# Patient Record
Sex: Female | Born: 1955 | Race: White | Hispanic: No | Marital: Married | Smoking: Never smoker
Health system: Southern US, Community
[De-identification: ages and names within clinical notes are randomized; demographics above are authoritative.]

## PROBLEM LIST (undated history)

## (undated) DIAGNOSIS — Z9889 Other specified postprocedural states: Secondary | ICD-10-CM

## (undated) DIAGNOSIS — D649 Anemia, unspecified: Secondary | ICD-10-CM

## (undated) DIAGNOSIS — I4891 Unspecified atrial fibrillation: Secondary | ICD-10-CM

## (undated) DIAGNOSIS — J189 Pneumonia, unspecified organism: Secondary | ICD-10-CM

## (undated) DIAGNOSIS — T4145XA Adverse effect of unspecified anesthetic, initial encounter: Secondary | ICD-10-CM

## (undated) DIAGNOSIS — T7840XA Allergy, unspecified, initial encounter: Secondary | ICD-10-CM

## (undated) DIAGNOSIS — R112 Nausea with vomiting, unspecified: Secondary | ICD-10-CM

## (undated) DIAGNOSIS — K220 Achalasia of cardia: Secondary | ICD-10-CM

## (undated) DIAGNOSIS — T8859XA Other complications of anesthesia, initial encounter: Secondary | ICD-10-CM

## (undated) DIAGNOSIS — I499 Cardiac arrhythmia, unspecified: Secondary | ICD-10-CM

## (undated) DIAGNOSIS — E041 Nontoxic single thyroid nodule: Secondary | ICD-10-CM

## (undated) DIAGNOSIS — K219 Gastro-esophageal reflux disease without esophagitis: Secondary | ICD-10-CM

## (undated) DIAGNOSIS — I10 Essential (primary) hypertension: Secondary | ICD-10-CM

## (undated) DIAGNOSIS — K5090 Crohn's disease, unspecified, without complications: Secondary | ICD-10-CM

## (undated) DIAGNOSIS — M1990 Unspecified osteoarthritis, unspecified site: Secondary | ICD-10-CM

## (undated) DIAGNOSIS — J40 Bronchitis, not specified as acute or chronic: Secondary | ICD-10-CM

## (undated) HISTORY — PX: ANKLE SURGERY: SHX546

## (undated) HISTORY — PX: THROAT SURGERY: SHX803

## (undated) HISTORY — DX: Unspecified osteoarthritis, unspecified site: M19.90

## (undated) HISTORY — DX: Gastro-esophageal reflux disease without esophagitis: K21.9

## (undated) HISTORY — PX: OTHER SURGICAL HISTORY: SHX169

## (undated) HISTORY — PX: ABDOMINAL HYSTERECTOMY: SHX81

## (undated) HISTORY — DX: Bronchitis, not specified as acute or chronic: J40

## (undated) HISTORY — DX: Pneumonia, unspecified organism: J18.9

## (undated) HISTORY — PX: ADENOIDECTOMY: SUR15

## (undated) HISTORY — PX: TONSILLECTOMY: SUR1361

## (undated) HISTORY — DX: Allergy, unspecified, initial encounter: T78.40XA

---

## 2011-08-16 ENCOUNTER — Encounter (HOSPITAL_COMMUNITY): Payer: Self-pay | Admitting: *Deleted

## 2011-08-16 ENCOUNTER — Emergency Department (HOSPITAL_COMMUNITY): Payer: No Typology Code available for payment source

## 2011-08-16 ENCOUNTER — Emergency Department (HOSPITAL_COMMUNITY)
Admission: EM | Admit: 2011-08-16 | Discharge: 2011-08-16 | Disposition: A | Discharge status: Home / Self Care | Payer: No Typology Code available for payment source | Attending: Emergency Medicine | Admitting: Emergency Medicine

## 2011-08-16 DIAGNOSIS — H538 Other visual disturbances: Secondary | ICD-10-CM | POA: Insufficient documentation

## 2011-08-16 DIAGNOSIS — I10 Essential (primary) hypertension: Secondary | ICD-10-CM | POA: Insufficient documentation

## 2011-08-16 DIAGNOSIS — R51 Headache: Secondary | ICD-10-CM | POA: Insufficient documentation

## 2011-08-16 DIAGNOSIS — M549 Dorsalgia, unspecified: Secondary | ICD-10-CM | POA: Insufficient documentation

## 2011-08-16 DIAGNOSIS — M542 Cervicalgia: Secondary | ICD-10-CM | POA: Insufficient documentation

## 2011-08-16 HISTORY — DX: Essential (primary) hypertension: I10

## 2011-08-16 MED ORDER — HYDROMORPHONE HCL PF 1 MG/ML IJ SOLN: 1.0000 mg | Freq: Once | INTRAMUSCULAR | Status: DC

## 2011-08-16 MED ORDER — KETOROLAC TROMETHAMINE 60 MG/2ML IM SOLN
60.0000 mg | Freq: Once | INTRAMUSCULAR | Status: AC
  Administered 2011-08-16: 60 mg via INTRAMUSCULAR
  Filled 2011-08-16: qty 2

## 2011-08-16 MED ORDER — IBUPROFEN 600 MG PO TABS: 600.0000 mg | ORAL_TABLET | Freq: Three times a day (TID) | ORAL | Status: AC | PRN
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
## 2011-10-06 ENCOUNTER — Other Ambulatory Visit: Payer: Self-pay | Admitting: Family Medicine

## 2011-10-06 DIAGNOSIS — M542 Cervicalgia: Secondary | ICD-10-CM

## 2011-10-10 ENCOUNTER — Ambulatory Visit
Admission: RE | Admit: 2011-10-10 | Discharge: 2011-10-10 | Disposition: A | Discharge status: Home / Self Care | Payer: BC Managed Care – PPO | Source: Ambulatory Visit | Attending: Family Medicine | Admitting: Family Medicine

## 2011-10-10 DIAGNOSIS — M542 Cervicalgia: Secondary | ICD-10-CM

## 2013-06-28 ENCOUNTER — Ambulatory Visit (INDEPENDENT_AMBULATORY_CARE_PROVIDER_SITE_OTHER): Payer: Worker's Compensation | Admitting: Podiatry

## 2013-06-28 ENCOUNTER — Encounter: Payer: Self-pay | Admitting: Podiatry

## 2013-06-28 VITALS — BP 130/77 | HR 69 | Ht 62.0 in | Wt 190.0 lb

## 2013-06-28 DIAGNOSIS — M76829 Posterior tibial tendinitis, unspecified leg: Secondary | ICD-10-CM

## 2013-06-28 DIAGNOSIS — M76821 Posterior tibial tendinitis, right leg: Secondary | ICD-10-CM | POA: Insufficient documentation

## 2013-06-28 DIAGNOSIS — M79671 Pain in right foot: Secondary | ICD-10-CM

## 2013-06-28 DIAGNOSIS — M79609 Pain in unspecified limb: Secondary | ICD-10-CM
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
## 2013-06-28 NOTE — Patient Instructions (Signed)
Seen for pain in right foot instep area. May have injured tendon. Ankle brace dispensed. Need Orthotics.

## 2013-07-10 ENCOUNTER — Telehealth: Payer: Self-pay | Admitting: *Deleted

## 2013-07-10 NOTE — Telephone Encounter (Signed)
Reginald from [facility] this am, 07/10/2013 and requested an RX for [id number]. He has approved the Orthotics. Will send Rx with Claim number and DX on Script to [facility] number is [phone] ... Be sure to put claim number on it also.

## 2013-07-15 ENCOUNTER — Encounter: Payer: Self-pay | Admitting: Podiatry

## 2013-07-15 ENCOUNTER — Ambulatory Visit (INDEPENDENT_AMBULATORY_CARE_PROVIDER_SITE_OTHER): Payer: Worker's Compensation | Admitting: Podiatry

## 2013-07-15 VITALS — BP 129/81 | HR 74 | Ht 62.0 in | Wt 190.0 lb

## 2013-07-15 DIAGNOSIS — M79671 Pain in right foot: Secondary | ICD-10-CM

## 2013-07-15 DIAGNOSIS — M76829 Posterior tibial tendinitis, unspecified leg: Secondary | ICD-10-CM

## 2013-07-15 DIAGNOSIS — M76821 Posterior tibial tendinitis, right leg: Secondary | ICD-10-CM

## 2013-07-15 DIAGNOSIS — M79609 Pain in unspecified limb: Secondary | ICD-10-CM

## 2013-07-15 NOTE — Progress Notes (Signed)
Subjective:  58 year old female presents to have Orthotics prepared.  Stated that she is wearing dress shoes most of the times at work and wants the orthotics to be made for her dress shoes if possible.   Objective: No changes in findings since last visit.  Dermatologic: Normal skin without color or temperature change. No open lesions or abnormal findings.  Vascular: All pedal pulses are palpable. No edema or erythema noted.  Neurologic: Epicritic and tactile sensations are grossly intact.  Positive of subjective pain upon weight bearing along the medial instep at about Naviculocuneiform joint, posterior tibial tendon insertion area.  Orthopedic: High arched cavus type foot. Ankle equinus bilateral. Unable to dorsiflex ankle joint beyond 90 degree with and without knee flexion.  Positive of excess sagittal plane motion of the first ray upon loading of the forefoot.   Radiographic examination was done on both feet. Findings reveal cavus type foot with mild elevation of the first ray bilateral. Positive of inferior calcaneal spur bilateral in Lateral view. In AP view noted of prominent Navicular bone at medial aspect with long first ray right foot (+1). Left foot Navicular bone is less prominent at medial aspect and the first ray length is normal (-2).  No acute changes noted.   Assessment: Tenosynovitis Posterior tibial tendon, post injury right foot.   Plan: Both feet were casted for Orthotics.

## 2013-07-15 NOTE — Patient Instructions (Signed)
Today X-rays on both feet were done. Casted for Orthotic preparation. Will contact patient when Orthotics are ready.

## 2013-08-14 DIAGNOSIS — J209 Acute bronchitis, unspecified: Secondary | ICD-10-CM | POA: Insufficient documentation

## 2013-08-14 DIAGNOSIS — J329 Chronic sinusitis, unspecified: Secondary | ICD-10-CM | POA: Insufficient documentation

## 2013-08-14 DIAGNOSIS — H9209 Otalgia, unspecified ear: Secondary | ICD-10-CM | POA: Insufficient documentation

## 2013-08-14 DIAGNOSIS — IMO0002 Reserved for concepts with insufficient information to code with codable children: Secondary | ICD-10-CM | POA: Insufficient documentation

## 2013-08-14 DIAGNOSIS — I10 Essential (primary) hypertension: Secondary | ICD-10-CM | POA: Insufficient documentation

## 2013-08-14 DIAGNOSIS — Z79899 Other long term (current) drug therapy: Secondary | ICD-10-CM | POA: Insufficient documentation

## 2013-08-14 DIAGNOSIS — B9789 Other viral agents as the cause of diseases classified elsewhere: Secondary | ICD-10-CM | POA: Insufficient documentation

## 2013-08-14 NOTE — ED Notes (Signed)
Pt. reports SOB with chest tightness / chest congestion , occasional dry cough onset Monday .

## 2013-08-15 ENCOUNTER — Emergency Department (HOSPITAL_COMMUNITY)
Admission: EM | Admit: 2013-08-15 | Discharge: 2013-08-15 | Disposition: A | Discharge status: Home / Self Care | Payer: BC Managed Care – PPO | Attending: Emergency Medicine | Admitting: Emergency Medicine

## 2013-08-15 ENCOUNTER — Emergency Department (HOSPITAL_COMMUNITY): Payer: BC Managed Care – PPO

## 2013-08-15 ENCOUNTER — Encounter (HOSPITAL_COMMUNITY): Payer: Self-pay | Admitting: Emergency Medicine

## 2013-08-15 DIAGNOSIS — B349 Viral infection, unspecified: Secondary | ICD-10-CM

## 2013-08-15 DIAGNOSIS — J40 Bronchitis, not specified as acute or chronic: Secondary | ICD-10-CM

## 2013-08-15 DIAGNOSIS — J329 Chronic sinusitis, unspecified: Secondary | ICD-10-CM

## 2013-08-15 LAB — CBC
HEMATOCRIT: 39.9 % (ref 36.0–46.0)
Hemoglobin: 14.2 g/dL (ref 12.0–15.0)
MCH: 31 pg (ref 26.0–34.0)
MCHC: 35.6 g/dL (ref 30.0–36.0)
MCV: 87.1 fL (ref 78.0–100.0)
PLATELETS: 188 10*3/uL (ref 150–400)
RBC: 4.58 MIL/uL (ref 3.87–5.11)
RDW: 12.2 % (ref 11.5–15.5)
WBC: 6.9 10*3/uL (ref 4.0–10.5)

## 2013-08-15 LAB — BASIC METABOLIC PANEL
BUN: 11 mg/dL (ref 6–23)
CALCIUM: 9.6 mg/dL (ref 8.4–10.5)
CHLORIDE: 99 meq/L (ref 96–112)
CO2: 23 mEq/L (ref 19–32)
CREATININE: 0.66 mg/dL (ref 0.50–1.10)
Glucose, Bld: 171 mg/dL — ABNORMAL HIGH (ref 70–99)
Potassium: 3.6 mEq/L — ABNORMAL LOW (ref 3.7–5.3)
Sodium: 140 mEq/L (ref 137–147)

## 2013-08-15 LAB — PRO B NATRIURETIC PEPTIDE: PRO B NATRI PEPTIDE: 24.7 pg/mL (ref 0–125)

## 2013-08-15 LAB — TROPONIN I: Troponin I: <0.3 ng/mL (ref <0.30)

## 2013-08-15 MED ORDER — PREDNISONE 50 MG PO TABS: 50.0000 mg | ORAL_TABLET | Freq: Every day | ORAL | Status: DC

## 2013-08-15 MED ORDER — AZITHROMYCIN 250 MG PO TABS: 250.0000 mg | ORAL_TABLET | Freq: Every day | ORAL | Status: DC

## 2013-08-15 MED ORDER — IPRATROPIUM-ALBUTEROL 0.5-2.5 (3) MG/3ML IN SOLN
3.0000 mL | Freq: Once | RESPIRATORY_TRACT | Status: AC
  Administered 2013-08-15: 3 mL via RESPIRATORY_TRACT
  Filled 2013-08-15: qty 3

## 2013-08-15 MED ORDER — PREDNISONE 20 MG PO TABS
60.0000 mg | ORAL_TABLET | Freq: Once | ORAL | Status: AC
  Administered 2013-08-15: 60 mg via ORAL
  Filled 2013-08-15: qty 3

## 2013-08-15 MED ORDER — ALBUTEROL SULFATE HFA 108 (90 BASE) MCG/ACT IN AERS: 1.0000 | INHALATION_SPRAY | Freq: Four times a day (QID) | RESPIRATORY_TRACT | Status: DC | PRN

## 2013-08-15 MED ORDER — BENZONATATE 100 MG PO CAPS: 100.0000 mg | ORAL_CAPSULE | Freq: Three times a day (TID) | ORAL | Status: DC

## 2013-08-15 NOTE — ED Provider Notes (Signed)
CSN: [id number]     Arrival date & time 08/14/13  2346 History   First MD Initiated Contact with Patient 08/15/13 0245     Chief Complaint  Patient presents with  . Shortness of Breath     (Consider location/radiation/quality/duration/timing/severity/associated sxs/prior Treatment) HPI Comments: 58 y/o female comes in w/ cc of chest tightness, dib. Pt has been having allergies since 2 months, and was started on nasocort on Monday, and since then the symptoms have worsened. Pt is having dib with walking. Improves with resting. Pt feels some chest tightness and congestion. Cohg is producing yellow phlegm. Pt has a low grade fever in the ED, and is feeling malaise as well. No body aches. No sick contracts.  Patient is a 58 y.o. female presenting with shortness of breath. The history is provided by the patient.  Shortness of Breath Associated symptoms: ear pain and sore throat   Associated symptoms: no abdominal pain, no chest pain, no headaches, no neck pain and no vomiting     Past Medical History  Diagnosis Date  . Hypertension    Past Surgical History  Procedure Laterality Date  . Abdominal hysterectomy    . Throat surgery    . Tonsillectomy    . Adenoidectomy     No family history on file. History  Substance Use Topics  . Smoking status: Never Smoker   . Smokeless tobacco: Never Used  . Alcohol Use: No   OB History   Grav Para Term Preterm Abortions TAB SAB Ect Mult Living                 Review of Systems  Constitutional: Positive for activity change.  HENT: Positive for congestion, ear pain, postnasal drip and sore throat. Negative for hearing loss and sinus pressure.   Respiratory: Positive for chest tightness and shortness of breath.   Cardiovascular: Negative for chest pain and leg swelling.  Gastrointestinal: Negative for nausea, vomiting and abdominal pain.  Genitourinary: Negative for dysuria.  Musculoskeletal: Negative for neck pain.  Neurological: Negative  for headaches.      Allergies  Codeine; Hydrocodone; and Oxycodone  Home Medications   Prior to Admission medications   Medication Sig Start Date End Date Taking? Authorizing Provider  atenolol (TENORMIN) 100 MG tablet Take 100 mg by mouth at bedtime.   Yes Historical Provider, MD  diphenhydrAMINE (BENADRYL) 25 MG tablet Take 25 mg by mouth at bedtime.   Yes Historical Provider, MD  fluticasone (FLONASE) 50 MCG/ACT nasal spray Place 1 spray into both nostrils daily.   Yes Historical Provider, MD  lisinopril-hydrochlorothiazide (PRINZIDE,ZESTORETIC) 10-12.5 MG per tablet Take 1 tablet by mouth daily.  06/17/13  Yes Historical Provider, MD  montelukast (SINGULAIR) 10 MG tablet Take 10 mg by mouth at bedtime.   Yes Historical Provider, MD  omeprazole (PRILOSEC) 20 MG capsule Take 20 mg by mouth daily.  04/10/13  Yes Historical Provider, MD   BP 138/77  Pulse 92  Temp(Src) 100.1 F (37.8 C) (Oral)  Resp 14  Ht 5\' 2"  (1.575 m)  Wt 189 lb (85.73 kg)  BMI 34.56 kg/m2  SpO2 96% Physical Exam  Nursing note and vitals reviewed. Constitutional: She is oriented to person, place, and time. She appears well-developed and well-nourished.  HENT:  Head: Normocephalic and atraumatic.  Eyes: EOM are normal. Pupils are equal, round, and reactive to light.  Neck: Neck supple.  Cardiovascular: Normal rate, regular rhythm and normal heart sounds.   No murmur heard. Pulmonary/Chest: Effort  normal. No respiratory distress. She has rales.  Left sided rales, crackles  Abdominal: Soft. She exhibits no distension. There is no tenderness. There is no rebound and no guarding.  Neurological: She is alert and oriented to person, place, and time.  Skin: Skin is warm and dry.    ED Course  Procedures (including critical care time) Labs Review Labs Reviewed  BASIC METABOLIC PANEL - Abnormal; Notable for the following:    Potassium 3.6 (*)    Glucose, Bld 171 (*)    All other components within normal  limits  CBC  PRO B NATRIURETIC PEPTIDE  TROPONIN I    Imaging Review Dg Chest 2 View  08/15/2013   CLINICAL DATA:  Short of breath.  Chest pain.  EXAM: CHEST - 2 VIEW  COMPARISON:  08/16/2011  FINDINGS: Left basilar atelectasis for scar. Right lung is clear. Normal heart size. No pneumothorax. No pleural fluid.  IMPRESSION: Left basilar atelectasis for scar.   Electronically Signed   By: Maryclare Bean M.D.   On: 08/15/2013 01:19     EKG Interpretation None      MDM   Final diagnoses:  Bronchitis  Sinusitis  Viral syndrome    Pt comes in with cc of chest tightness. She has hx of HTN, and has been having uri like sx for the past few days. She has had produce amount of cough, unable to sleep well because of that and some chest tightness and idb from her sx. She feels very congested, in her chest and her upper rest tract. She is already taking several OTC meds. Will add tessalon, pred (she wheezes, bronchitis). Antibiotics for wait and see approach.   Varney Biles, MD 08/15/13 [phone]

## 2013-08-15 NOTE — ED Notes (Signed)
Discharge and follow up instructions reviewed with pt. Pt verbalized understanding.  

## 2013-08-15 NOTE — Discharge Instructions (Signed)
We saw you in the ER for THE COUGH, CHEST TIGHTNESS. We think what you have is a viral syndrome and bronchitis - the treatment for which is symptomatic relief only, and your body will fight the infection off in a few days. We are prescribing you some meds for pain and fevers. See your primary care doctor in 1 week if the symptoms dont improve.   Bronchitis Bronchitis is inflammation of the airways that extend from the windpipe into the lungs (bronchi). The inflammation often causes mucus to develop, which leads to a cough. If the inflammation becomes severe, it may cause shortness of breath. CAUSES  Bronchitis may be caused by:   Viral infections.   Bacteria.   Cigarette smoke.   Allergens, pollutants, and other irritants.  SIGNS AND SYMPTOMS  The most common symptom of bronchitis is a frequent cough that produces mucus. Other symptoms include:  Fever.   Body aches.   Chest congestion.   Chills.   Shortness of breath.   Sore throat.  DIAGNOSIS  Bronchitis is usually diagnosed through a medical history and physical exam. Tests, such as chest X-rays, are sometimes done to rule out other conditions.  TREATMENT  You may need to avoid contact with whatever caused the problem (smoking, for example). Medicines are sometimes needed. These may include:  Antibiotics. These may be prescribed if the condition is caused by bacteria.  Cough suppressants. These may be prescribed for relief of cough symptoms.   Inhaled medicines. These may be prescribed to help open your airways and make it easier for you to breathe.   Steroid medicines. These may be prescribed for those with recurrent (chronic) bronchitis. HOME CARE INSTRUCTIONS  Get plenty of rest.   Drink enough fluids to keep your urine clear or pale yellow (unless you have a medical condition that requires fluid restriction). Increasing fluids may help thin your secretions and will prevent dehydration.   Only take  over-the-counter or prescription medicines as directed by your health care provider.  Only take antibiotics as directed. Make sure you finish them even if you start to feel better.  Avoid secondhand smoke, irritating chemicals, and strong fumes. These will make bronchitis worse. If you are a smoker, quit smoking. Consider using nicotine gum or skin patches to help control withdrawal symptoms. Quitting smoking will help your lungs heal faster.   Put a cool-mist humidifier in your bedroom at night to moisten the air. This may help loosen mucus. Change the water in the humidifier daily. You can also run the hot water in your shower and sit in the bathroom with the door closed for 5 10 minutes.   Follow up with your health care provider as directed.   Wash your hands frequently to avoid catching bronchitis again or spreading an infection to others.  SEEK MEDICAL CARE IF: Your symptoms do not improve after 1 week of treatment.  SEEK IMMEDIATE MEDICAL CARE IF:  Your fever increases.  You have chills.   You have chest pain.   You have worsening shortness of breath.   You have bloody sputum.  You faint.  You have lightheadedness.  You have a severe headache.   You vomit repeatedly. MAKE SURE YOU:   Understand these instructions.  Will watch your condition.  Will get help right away if you are not doing well or get worse. Document Released: 04/11/2005 Document Revised: 01/30/2013 Document Reviewed: 12/04/2012 [facility] Patient Information 2014 [place].  Sinusitis Sinusitis is redness, soreness, and swelling (inflammation)  of the paranasal sinuses. Paranasal sinuses are air pockets within the bones of your face (beneath the eyes, the middle of the forehead, or above the eyes). In healthy paranasal sinuses, mucus is able to drain out, and air is able to circulate through them by way of your nose. However, when your paranasal sinuses are inflamed, mucus and air can  become trapped. This can allow bacteria and other germs to grow and cause infection. Sinusitis can develop quickly and last only a short time (acute) or continue over a long period (chronic). Sinusitis that lasts for more than 12 weeks is considered chronic.  CAUSES  Causes of sinusitis include:  Allergies.  Structural abnormalities, such as displacement of the cartilage that separates your nostrils (deviated septum), which can decrease the air flow through your nose and sinuses and affect sinus drainage.  Functional abnormalities, such as when the small hairs (cilia) that line your sinuses and help remove mucus do not work properly or are not present. SYMPTOMS  Symptoms of acute and chronic sinusitis are the same. The primary symptoms are pain and pressure around the affected sinuses. Other symptoms include:  Upper toothache.  Earache.  Headache.  Bad breath.  Decreased sense of smell and taste.  A cough, which worsens when you are lying flat.  Fatigue.  Fever.  Thick drainage from your nose, which often is green and may contain pus (purulent).  Swelling and warmth over the affected sinuses. DIAGNOSIS  Your caregiver will perform a physical exam. During the exam, your caregiver may:  Look in your nose for signs of abnormal growths in your nostrils (nasal polyps).  Tap over the affected sinus to check for signs of infection.  View the inside of your sinuses (endoscopy) with a special imaging device with a light attached (endoscope), which is inserted into your sinuses. If your caregiver suspects that you have chronic sinusitis, one or more of the following tests may be recommended:  Allergy tests.  Nasal culture A sample of mucus is taken from your nose and sent to a lab and screened for bacteria.  Nasal cytology A sample of mucus is taken from your nose and examined by your caregiver to determine if your sinusitis is related to an allergy. TREATMENT  Most cases of  acute sinusitis are related to a viral infection and will resolve on their own within 10 days. Sometimes medicines are prescribed to help relieve symptoms (pain medicine, decongestants, nasal steroid sprays, or saline sprays).  However, for sinusitis related to a bacterial infection, your caregiver will prescribe antibiotic medicines. These are medicines that will help kill the bacteria causing the infection.  Rarely, sinusitis is caused by a fungal infection. In theses cases, your caregiver will prescribe antifungal medicine. For some cases of chronic sinusitis, surgery is needed. Generally, these are cases in which sinusitis recurs more than 3 times per year, despite other treatments. HOME CARE INSTRUCTIONS   Drink plenty of water. Water helps thin the mucus so your sinuses can drain more easily.  Use a humidifier.  Inhale steam 3 to 4 times a day (for example, sit in the bathroom with the shower running).  Apply a warm, moist washcloth to your face 3 to 4 times a day, or as directed by your caregiver.  Use saline nasal sprays to help moisten and clean your sinuses.  Take over-the-counter or prescription medicines for pain, discomfort, or fever only as directed by your caregiver. SEEK IMMEDIATE MEDICAL CARE IF:  You have increasing pain  or severe headaches.  You have nausea, vomiting, or drowsiness.  You have swelling around your face.  You have vision problems.  You have a stiff neck.  You have difficulty breathing. MAKE SURE YOU:   Understand these instructions.  Will watch your condition.  Will get help right away if you are not doing well or get worse. Document Released: 04/11/2005 Document Revised: 07/04/2011 Document Reviewed: 04/26/2011 [facility] Patient Information 2014 [place], [place].  Viral Infections A viral infection can be caused by different types of viruses.Most viral infections are not serious and resolve on their own. However, some infections may cause  severe symptoms and may lead to further complications. SYMPTOMS Viruses can frequently cause:  Minor sore throat.  Aches and pains.  Headaches.  Runny nose.  Different types of rashes.  Watery eyes.  Tiredness.  Cough.  Loss of appetite.  Gastrointestinal infections, resulting in nausea, vomiting, and diarrhea. These symptoms do not respond to antibiotics because the infection is not caused by bacteria. However, you might catch a bacterial infection following the viral infection. This is sometimes called a "superinfection." Symptoms of such a bacterial infection may include:  Worsening sore throat with pus and difficulty swallowing.  Swollen neck glands.  Chills and a high or persistent fever.  Severe headache.  Tenderness over the sinuses.  Persistent overall ill feeling (malaise), muscle aches, and tiredness (fatigue).  Persistent cough.  Yellow, green, or brown mucus production with coughing. HOME CARE INSTRUCTIONS   Only take over-the-counter or prescription medicines for pain, discomfort, diarrhea, or fever as directed by your caregiver.  Drink enough water and fluids to keep your urine clear or pale yellow. Sports drinks can provide valuable electrolytes, sugars, and hydration.  Get plenty of rest and maintain proper nutrition. Soups and broths with crackers or rice are fine. SEEK IMMEDIATE MEDICAL CARE IF:   You have severe headaches, shortness of breath, chest pain, neck pain, or an unusual rash.  You have uncontrolled vomiting, diarrhea, or you are unable to keep down fluids.  You or your child has an oral temperature above 102 F (38.9 C), not controlled by medicine.  Your baby is older than 3 months with a rectal temperature of 102 F (38.9 C) or higher.  Your baby is 24 months old or younger with a rectal temperature of 100.4 F (38 C) or higher. MAKE SURE YOU:   Understand these instructions.  Will watch your condition.  Will get help  right away if you are not doing well or get worse. Document Released: 01/19/2005 Document Revised: 07/04/2011 Document Reviewed: 08/16/2010 [facility] Patient Information 2014 [place], [place].

## 2013-08-15 NOTE — ED Notes (Signed)
Pt states she has been feeling sob when she coughs, and upon exertion. Pt states she has sore throat, her ears hurt and her coughing is keeping her from sleeping. Pt states she has yellow phlegm when she coughs.

## 2013-09-04 ENCOUNTER — Ambulatory Visit: Payer: Self-pay | Admitting: Podiatry

## 2013-09-13 ENCOUNTER — Ambulatory Visit (INDEPENDENT_AMBULATORY_CARE_PROVIDER_SITE_OTHER): Payer: Worker's Compensation | Admitting: Podiatry

## 2013-09-13 ENCOUNTER — Encounter: Payer: Self-pay | Admitting: Podiatry

## 2013-09-13 DIAGNOSIS — M76821 Posterior tibial tendinitis, right leg: Secondary | ICD-10-CM

## 2013-09-13 DIAGNOSIS — M76829 Posterior tibial tendinitis, unspecified leg: Secondary | ICD-10-CM

## 2013-09-13 NOTE — Progress Notes (Signed)
Orthotic fixed problem within days.  No more pain on foot.  Will monitor as needed.

## 2013-09-13 NOTE — Patient Instructions (Signed)
Seen for follow up orthotics. Doing well and satisfied.  Return as needed.

## 2014-10-17 ENCOUNTER — Encounter (HOSPITAL_COMMUNITY): Payer: Self-pay | Admitting: *Deleted

## 2014-10-17 ENCOUNTER — Inpatient Hospital Stay (HOSPITAL_COMMUNITY)
Admission: EM | Admit: 2014-10-17 | Discharge: 2014-10-19 | DRG: 194 | Disposition: A | Discharge status: Home / Self Care | Payer: BC Managed Care – PPO | Attending: Family Medicine | Admitting: Family Medicine

## 2014-10-17 ENCOUNTER — Emergency Department (HOSPITAL_COMMUNITY): Payer: BC Managed Care – PPO

## 2014-10-17 ENCOUNTER — Inpatient Hospital Stay (HOSPITAL_COMMUNITY): Payer: BC Managed Care – PPO

## 2014-10-17 DIAGNOSIS — A419 Sepsis, unspecified organism: Secondary | ICD-10-CM | POA: Diagnosis not present

## 2014-10-17 DIAGNOSIS — Z885 Allergy status to narcotic agent status: Secondary | ICD-10-CM | POA: Diagnosis not present

## 2014-10-17 DIAGNOSIS — Z79899 Other long term (current) drug therapy: Secondary | ICD-10-CM

## 2014-10-17 DIAGNOSIS — J189 Pneumonia, unspecified organism: Principal | ICD-10-CM | POA: Diagnosis present

## 2014-10-17 DIAGNOSIS — Z791 Long term (current) use of non-steroidal anti-inflammatories (NSAID): Secondary | ICD-10-CM | POA: Diagnosis not present

## 2014-10-17 DIAGNOSIS — I10 Essential (primary) hypertension: Secondary | ICD-10-CM | POA: Diagnosis present

## 2014-10-17 DIAGNOSIS — J9811 Atelectasis: Secondary | ICD-10-CM | POA: Diagnosis present

## 2014-10-17 DIAGNOSIS — R7302 Impaired glucose tolerance (oral): Secondary | ICD-10-CM | POA: Diagnosis not present

## 2014-10-17 DIAGNOSIS — K219 Gastro-esophageal reflux disease without esophagitis: Secondary | ICD-10-CM | POA: Diagnosis present

## 2014-10-17 DIAGNOSIS — R079 Chest pain, unspecified: Secondary | ICD-10-CM

## 2014-10-17 DIAGNOSIS — R0602 Shortness of breath: Secondary | ICD-10-CM | POA: Diagnosis present

## 2014-10-17 DIAGNOSIS — Z6835 Body mass index (BMI) 35.0-35.9, adult: Secondary | ICD-10-CM | POA: Diagnosis not present

## 2014-10-17 DIAGNOSIS — Z91040 Latex allergy status: Secondary | ICD-10-CM

## 2014-10-17 LAB — CBC WITH DIFFERENTIAL/PLATELET
Basophils Absolute: 0 10*3/uL (ref 0.0–0.1)
Basophils Relative: 0 % (ref 0–1)
Eosinophils Absolute: 0.1 10*3/uL (ref 0.0–0.7)
Eosinophils Relative: 0 % (ref 0–5)
HCT: 37.8 % (ref 36.0–46.0)
Hemoglobin: 12.8 g/dL (ref 12.0–15.0)
LYMPHS ABS: 1.9 10*3/uL (ref 0.7–4.0)
Lymphocytes Relative: 10 % — ABNORMAL LOW (ref 12–46)
MCH: 29.5 pg (ref 26.0–34.0)
MCHC: 33.9 g/dL (ref 30.0–36.0)
MCV: 87.1 fL (ref 78.0–100.0)
Monocytes Absolute: 1.7 10*3/uL — ABNORMAL HIGH (ref 0.1–1.0)
Monocytes Relative: 9 % (ref 3–12)
NEUTROS ABS: 14.4 10*3/uL — AB (ref 1.7–7.7)
Neutrophils Relative %: 81 % — ABNORMAL HIGH (ref 43–77)
Platelets: 328 10*3/uL (ref 150–400)
RBC: 4.34 MIL/uL (ref 3.87–5.11)
RDW: 11.7 % (ref 11.5–15.5)
WBC: 18 10*3/uL — AB (ref 4.0–10.5)

## 2014-10-17 LAB — COMPREHENSIVE METABOLIC PANEL
ALT: 16 U/L (ref 14–54)
AST: 16 U/L (ref 15–41)
Albumin: 3.5 g/dL (ref 3.5–5.0)
Alkaline Phosphatase: 84 U/L (ref 38–126)
Anion gap: 12 (ref 5–15)
BUN: 11 mg/dL (ref 6–20)
CALCIUM: 9.4 mg/dL (ref 8.9–10.3)
CO2: 25 mmol/L (ref 22–32)
CREATININE: 0.68 mg/dL (ref 0.44–1.00)
Chloride: 101 mmol/L (ref 101–111)
Glucose, Bld: 150 mg/dL — ABNORMAL HIGH (ref 65–99)
POTASSIUM: 3.7 mmol/L (ref 3.5–5.1)
Sodium: 138 mmol/L (ref 135–145)
Total Bilirubin: 0.6 mg/dL (ref 0.3–1.2)
Total Protein: 7.2 g/dL (ref 6.5–8.1)

## 2014-10-17 LAB — LIPASE, BLOOD: LIPASE: 15 U/L — AB (ref 22–51)

## 2014-10-17 LAB — I-STAT TROPONIN, ED: Troponin i, poc: 0 ng/mL (ref 0.00–0.08)

## 2014-10-17 LAB — I-STAT CG4 LACTIC ACID, ED: Lactic Acid, Venous: 1.77 mmol/L (ref 0.5–2.0)

## 2014-10-17 LAB — BRAIN NATRIURETIC PEPTIDE: B NATRIURETIC PEPTIDE 5: 71.3 pg/mL (ref 0.0–100.0)

## 2014-10-17 MED ORDER — ONDANSETRON HCL 4 MG/2ML IJ SOLN
4.0000 mg | Freq: Once | INTRAMUSCULAR | Status: AC
  Administered 2014-10-17: 4 mg via INTRAVENOUS
  Filled 2014-10-17: qty 2

## 2014-10-17 MED ORDER — IBUPROFEN 400 MG PO TABS
400.0000 mg | ORAL_TABLET | Freq: Once | ORAL | Status: AC
  Administered 2014-10-17: 400 mg via ORAL
  Filled 2014-10-17: qty 1

## 2014-10-17 MED ORDER — IOHEXOL 350 MG/ML SOLN
80.0000 mL | Freq: Once | INTRAVENOUS | Status: AC | PRN
  Administered 2014-10-17: 80 mL via INTRAVENOUS

## 2014-10-17 MED ORDER — MORPHINE SULFATE 4 MG/ML IJ SOLN
4.0000 mg | Freq: Once | INTRAMUSCULAR | Status: AC
  Administered 2014-10-17: 4 mg via INTRAVENOUS
  Filled 2014-10-17: qty 1

## 2014-10-17 MED ORDER — OXYCODONE HCL 5 MG PO TABS: 5.0000 mg | ORAL_TABLET | ORAL | Status: DC | PRN

## 2014-10-17 MED ORDER — IBUPROFEN 200 MG PO TABS: 600.0000 mg | ORAL_TABLET | ORAL | Status: DC | PRN

## 2014-10-17 MED ORDER — DEXTROSE 5 % IV SOLN
500.0000 mg | INTRAVENOUS | Status: DC
  Administered 2014-10-18: 500 mg via INTRAVENOUS
  Filled 2014-10-17 (×3): qty 500

## 2014-10-17 MED ORDER — DEXTROSE 5 % IV SOLN
1.0000 g | INTRAVENOUS | Status: DC
  Administered 2014-10-18: 1 g via INTRAVENOUS
  Filled 2014-10-17 (×3): qty 10

## 2014-10-17 MED ORDER — DEXTROSE 5 % IV SOLN
1.0000 g | Freq: Once | INTRAVENOUS | Status: AC
  Administered 2014-10-18: 1 g via INTRAVENOUS
  Filled 2014-10-17: qty 10

## 2014-10-17 MED ORDER — DEXTROSE 5 % IV SOLN
500.0000 mg | Freq: Once | INTRAVENOUS | Status: AC
  Administered 2014-10-17: 500 mg via INTRAVENOUS
  Filled 2014-10-17: qty 500

## 2014-10-17 NOTE — ED Provider Notes (Signed)
CSN: [id number]     Arrival date & time 10/17/14  1729 History   First MD Initiated Contact with Patient 10/17/14 2100     Chief Complaint  Patient presents with  . Shortness of Breath  . Abdominal Pain  . Chest Pain     (Consider location/radiation/quality/duration/timing/severity/associated sxs/prior Treatment) HPI Comments: Patient is a 59 year old female with a past medical history of hypertension who presents with chest pain that started earlier today. The pain started gradually and remained constant since the onset. The pain is located in her anterior left chest and radiates around to her lateral left chest and left upper back. The pain is aching and severe. Deep inspiration makes the pain worse. No alleviating factors. Patient reports associated SOB for the past week that has continued. No other associated symptoms.    Past Medical History  Diagnosis Date  . Hypertension    Past Surgical History  Procedure Laterality Date  . Abdominal hysterectomy    . Throat surgery    . Tonsillectomy    . Adenoidectomy     History reviewed. No pertinent family history. History  Substance Use Topics  . Smoking status: Never Smoker   . Smokeless tobacco: Never Used  . Alcohol Use: No   OB History    No data available     Review of Systems  Constitutional: Negative for fever, chills and fatigue.  HENT: Negative for trouble swallowing.   Eyes: Negative for visual disturbance.  Respiratory: Positive for cough and shortness of breath.   Cardiovascular: Positive for chest pain. Negative for palpitations.  Gastrointestinal: Positive for abdominal pain. Negative for nausea, vomiting and diarrhea.  Genitourinary: Negative for dysuria and difficulty urinating.  Musculoskeletal: Negative for arthralgias and neck pain.  Skin: Negative for color change.  Neurological: Negative for dizziness and weakness.  Psychiatric/Behavioral: Negative for dysphoric mood.      Allergies  Codeine;  Hydrocodone; Latex; and Oxycodone  Home Medications   Prior to Admission medications   Medication Sig Start Date End Date Taking? Authorizing Provider  albuterol (PROVENTIL HFA;VENTOLIN HFA) 108 (90 BASE) MCG/ACT inhaler Inhale 1-2 puffs into the lungs every 6 (six) hours as needed for wheezing or shortness of breath. 08/15/13  Yes Varney Biles, MD  atenolol (TENORMIN) 100 MG tablet Take 100 mg by mouth at bedtime.   Yes Historical Provider, MD  diphenhydrAMINE (BENADRYL) 25 MG tablet Take 25 mg by mouth at bedtime.   Yes Historical Provider, MD  fluticasone (FLONASE) 50 MCG/ACT nasal spray Place 1 spray into both nostrils daily.   Yes Historical Provider, MD  ibuprofen (ADVIL,MOTRIN) 200 MG tablet Take 400 mg by mouth every 6 (six) hours as needed for mild pain.   Yes Historical Provider, MD  lisinopril-hydrochlorothiazide (PRINZIDE,ZESTORETIC) 10-12.5 MG per tablet Take 1 tablet by mouth daily.  06/17/13  Yes Historical Provider, MD  montelukast (SINGULAIR) 10 MG tablet Take 10 mg by mouth daily.    Yes Historical Provider, MD  omeprazole (PRILOSEC) 20 MG capsule Take 20 mg by mouth daily.  04/10/13  Yes Historical Provider, MD   BP 131/73 mmHg  Pulse 92  Temp(Src) 98.2 F (36.8 C) (Oral)  Resp 32  SpO2 97% Physical Exam  Constitutional: She is oriented to person, place, and time. She appears well-developed and well-nourished. No distress.  HENT:  Head: Normocephalic and atraumatic.  Eyes: Conjunctivae and EOM are normal.  Neck: Normal range of motion.  Cardiovascular: Normal rate and regular rhythm.  Exam reveals no  gallop and no friction rub.   No murmur heard. No lower extremity edema or calf tenderness to palpation.   Pulmonary/Chest: Breath sounds normal. She has no wheezes. She has no rales. She exhibits no tenderness.  Tachypnea. Increased breathing effort.   Abdominal: Soft. She exhibits no distension. There is tenderness. There is no rebound.  Left upper abdominal  tenderness to palpation. No other focal tenderness.   Musculoskeletal: Normal range of motion.  Neurological: She is alert and oriented to person, place, and time. Coordination normal.  Speech is goal-oriented. Moves limbs without ataxia.   Skin: Skin is warm and dry.  Psychiatric: She has a normal mood and affect. Her behavior is normal.  Nursing note and vitals reviewed.   ED Course  Procedures (including critical care time) Labs Review Labs Reviewed  COMPREHENSIVE METABOLIC PANEL - Abnormal; Notable for the following:    Glucose, Bld 150 (*)    All other components within normal limits  CBC WITH DIFFERENTIAL/PLATELET - Abnormal; Notable for the following:    WBC 18.0 (*)    Neutrophils Relative % 81 (*)    Neutro Abs 14.4 (*)    Lymphocytes Relative 10 (*)    Monocytes Absolute 1.7 (*)    All other components within normal limits  LIPASE, BLOOD - Abnormal; Notable for the following:    Lipase 15 (*)    All other components within normal limits  COMPREHENSIVE METABOLIC PANEL - Abnormal; Notable for the following:    Chloride 97 (*)    Glucose, Bld 241 (*)    Calcium 8.8 (*)    Total Protein 6.2 (*)    Albumin 3.0 (*)    AST 14 (*)    All other components within normal limits  CBC - Abnormal; Notable for the following:    WBC 21.8 (*)    Hemoglobin 11.7 (*)    HCT 34.0 (*)    All other components within normal limits  PROTIME-INR - Abnormal; Notable for the following:    Prothrombin Time 16.4 (*)    All other components within normal limits  CULTURE, BLOOD (ROUTINE X 2)  CULTURE, BLOOD (ROUTINE X 2)  CULTURE, EXPECTORATED SPUTUM-ASSESSMENT  GRAM STAIN  BRAIN NATRIURETIC PEPTIDE  HIV ANTIBODY (ROUTINE TESTING)  LEGIONELLA ANTIGEN, URINE  STREP PNEUMONIAE URINARY ANTIGEN  INFLUENZA PANEL BY PCR (TYPE A & B, H1N1)(NOT AT [facility])  I-STAT CG4 LACTIC ACID, ED  I-STAT TROPOININ, ED  I-STAT CG4 LACTIC ACID, ED    Imaging Review Dg Chest 2 View  10/17/2014   CLINICAL  DATA:  Left-sided chest pain. Coughing. Shortness of breath.  EXAM: CHEST  2 VIEW  COMPARISON:  08/15/2013  FINDINGS: There is a focal area of pulmonary infiltrates in the left lung base with atelectasis with slight elevation of the left hemidiaphragm. Overall heart size and pulmonary vascularity are normal. Minimal atelectasis at the right lung base. Peribronchial thickening on the left.  Slight thoracolumbar scoliosis, unchanged.  IMPRESSION: Infiltrate in the left lung with atelectasis at both lung bases, left more than right.   Electronically Signed   By: Lorriane Shire M.D.   On: 10/17/2014 18:44   Ct Angio Chest Pe W/cm &/or Wo Cm  10/18/2014   CLINICAL DATA:  Chest pain, shortness of breath beginning last night. History of recurrent pneumonia.  EXAM: CT ANGIOGRAPHY CHEST WITH CONTRAST  TECHNIQUE: Multidetector CT imaging of the chest was performed using the standard protocol during bolus administration of intravenous contrast. Multiplanar CT image reconstructions  and MIPs were obtained to evaluate the vascular anatomy.  CONTRAST:  68mL OMNIPAQUE IOHEXOL 350 MG/ML SOLN  COMPARISON:  Chest radiograph October 17, 2014 at 1811 hours  FINDINGS: PULMONARY ARTERY: Adequate contrast opacification of the pulmonary artery's. Main pulmonary artery is not enlarged. No pulmonary arterial filling defects to the level of the subsegmental branches.  MEDIASTINUM: Heart and pericardium are unremarkable, no right heart strain. Thoracic aorta is normal course and caliber, trace calcific atherosclerosis of the aortic arch. Round 13 mm aortopulmonary window lymph node, with additional small rounded pretracheal, aortopulmonary window lymph nodes. Mild LEFT hilar lymphadenopathy. Distended debris-filled esophagus.  LUNGS: Tracheobronchial tree is patent, no pneumothorax. Small LEFT pleural effusion. Lingular pleural-based hypo enhancing masslike 4.6 cm consolidation with mild stranding of the adjacent epicardial fat pad.  SOFT  TISSUES AND OSSEOUS STRUCTURES: Included view of the abdomen demonstrates calcified splenic granulomas, a single granuloma LEFT lobe of the liver, the liver is hypodense, likely representing steatosis. Bilateral breast implants. Small reniform lymph nodes in the bilateral axilla are likely reactive. Mild degenerative change of thoracic spine without destructive bony lesions.  Review of the MIP images confirms the above findings.  IMPRESSION: No acute pulmonary embolism.  Masslike lingular consolidation likely represents pneumonia though, neoplasm not excluded. Small LEFT pleural effusion. Recommend followup. Mediastinal and LEFT hilar lymphadenopathy could be pathologic or, possibly reactive.  Achalasia (debris distended esophagus is an aspiration risk).   Electronically Signed   By: Elon Alas M.D.   On: 10/18/2014 00:10     EKG Interpretation None      MDM   Final diagnoses:  SOB (shortness of breath)  Chest pain   9:15 PM I reviewed the patient's labs and chest xray with show elevated WBC at 18 and chest xray shows infiltrate in the left lung and atelectasis in both bases. Vitals show tachypnea with remaining vitals stable.   Patient admitted to Dr. Velna Ochs, PA-C 10/18/14 [id number]  Pamella Pert, MD 10/18/14 1236

## 2014-10-17 NOTE — ED Notes (Signed)
Pt ambulated with no assistance. She was CAOx4 throughout. SPO2 levels were consistently around 95-97%. Levels did drop to 91% or 92% while ambulating however increased a few seconds later and maintained around 95-97%.

## 2014-10-17 NOTE — H&P (Addendum)
[facility] History and Physical  Chinara Hertzberg UKG:[id number] DOB: 09-May-1955 DOA: 10/17/2014  Referring physician: ED-PA Verline Lema PCP: Myriam Jacobson, MD  Specialists: none  Chief Complaint: Chest pain fever  HPI:  Very pleasant 59 year old female no real past medical history other than hypertension on dual agents as well as history of tenosynovitis posterior tibial tendon status post injury right foot presented to [facility] emergency room 6/24 with two-week history of cold and cough symptoms. She took zinc and over-the-counter remedies for this About one week prior to admission she went to her mother's home and still had cough and cold which worsened over the past week. She developed significant chills and rigors She took further home remedies including ibuprofen and a lot of vitamin C and felt better 2 days ago however 1 day prior to admission she started feeling weak with significant back and central chest pain and pressure. She noticed that her temperature was high for her at 98. The pain was constant in nature and worsened by coughing as well as by taking deep breaths. She states that the night prior to admission she could not sleep because the pain was so severe and she felt orthopneic She took 2 Motrin at 2 AM and then another form and finally went to sleep and drove back from [place] which is a 4 Hour drive from her current location  At the bedside she is slightly tachypneic and finds it difficult to take a deep breath because of pain  White count 18, hemoglobin 12.8, hematocrit 37, proBNP 71, point of care troponins 0.00, lactic acid 1.77 Chest x-ray showed bilateral pneumonia with atelectasis  Review of Systems: The patient denies  Unilateral weakness, diarrhea, nausea, blurred vision, double vision, dysuria, hematuria, rash, A 14 point ROS was performed and is negative except as noted in the HPI    Past Medical History  Diagnosis Date  . Hypertension    Past  Surgical History  Procedure Laterality Date  . Abdominal hysterectomy    . Throat surgery    . Tonsillectomy    . 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 Codeine Nausea And Vomiting and Other (See Comments)    hallucinations  . Hydrocodone Nausea And Vomiting  . Latex Other (See Comments)    Blisters / skin peeling  . Oxycodone Nausea And Vomiting    Family History  Problem Relation Age of Onset  . Lung cancer Father     1982 died from it  . Hernia Mother     Prior to Admission medications   Medication Sig Start Date End Date Taking? Authorizing Provider  albuterol (PROVENTIL HFA;VENTOLIN HFA) 108 (90 BASE) MCG/ACT inhaler Inhale 1-2 puffs into the lungs every 6 (six) hours as needed for wheezing or shortness of breath. 08/15/13  Yes Varney Biles, MD  atenolol (TENORMIN) 100 MG tablet Take 100 mg by mouth at bedtime.   Yes Historical Provider, MD  diphenhydrAMINE (BENADRYL) 25 MG tablet Take 25 mg by mouth at bedtime.   Yes Historical Provider, MD  fluticasone (FLONASE) 50 MCG/ACT nasal spray Place 1 spray into both nostrils daily.   Yes Historical Provider, MD  ibuprofen (ADVIL,MOTRIN) 200  MG tablet Take 400 mg by mouth every 6 (six) hours as needed for mild pain.   Yes Historical Provider, MD  lisinopril-hydrochlorothiazide (PRINZIDE,ZESTORETIC) 10-12.5 MG per tablet Take 1 tablet by mouth daily.  06/17/13  Yes Historical Provider, MD  montelukast (SINGULAIR) 10 MG tablet Take 10 mg by mouth daily.    Yes Historical Provider, MD  omeprazole (PRILOSEC) 20 MG capsule Take 20 mg by mouth daily.  04/10/13  Yes Historical Provider, MD   Physical Exam: Filed Vitals:   10/17/14 2145 10/17/14 2200 10/17/14 2215 10/17/14  2300  BP: 131/82 117/72 115/69 102/67  Pulse: 92 90 92 94  Temp:      TempSrc:      Resp: 23 28 24 24   SpO2: 94% 95% 92% 93%     Pleasant but tired-appearing Caucasian female, slight use of accessory muscles Dentition is good, Mallampati 2, throat is clear S1-S2 slightly tachycardic Pendulous breasts however has pain to pressure over chest wall all over No wheeze no rhonchi no rales Abdomen soft nontender nondistended Lower extremities not swollen nondistended Cranial nerves grossly intact   Labs on Admission:  Basic Metabolic Panel:  Recent Labs Lab 10/17/14 1805  NA 138  K 3.7  CL 101  CO2 25  GLUCOSE 150*  BUN 11  CREATININE 0.68  CALCIUM 9.4   Liver Function Tests:  Recent Labs Lab 10/17/14 1805  AST 16  ALT 16  ALKPHOS 84  BILITOT 0.6  PROT 7.2  ALBUMIN 3.5    Recent Labs Lab 10/17/14 2123  LIPASE 15*   No results for input(s): AMMONIA in the last 168 hours. CBC:  Recent Labs Lab 10/17/14 1805  WBC 18.0*  NEUTROABS 14.4*  HGB 12.8  HCT 37.8  MCV 87.1  PLT 328   Cardiac Enzymes: No results for input(s): CKTOTAL, CKMB, CKMBINDEX, TROPONINI in the last 168 hours.  BNP (last 3 results)  Recent Labs  10/17/14 1805  BNP 71.3    ProBNP (last 3 results) No results for input(s): PROBNP in the last 8760 hours.  CBG: No results for input(s): GLUCAP in the last 168 hours.  Radiological Exams on Admission: Dg Chest 2 View  10/17/2014   CLINICAL DATA:  Left-sided chest pain. Coughing. Shortness of breath.  EXAM: CHEST  2 VIEW  COMPARISON:  08/15/2013  FINDINGS: There is a focal area of pulmonary infiltrates in the left lung base with atelectasis with slight elevation of the left hemidiaphragm. Overall heart size and pulmonary vascularity are normal. Minimal atelectasis at the right lung base. Peribronchial thickening on the left.  Slight thoracolumbar scoliosis, unchanged.  IMPRESSION: Infiltrate in the left lung with atelectasis at both  lung bases, left more than right.   Electronically Signed   By: Lorriane Shire M.D.   On: 10/17/2014 18:44    EKG: Independently reviewed. Sinus tachycardia PR interval 0.12 QRS axis 60, no ST-T wave changes acutely  Assessment/Plan Principal Problem:   Sepsis-Bilateral Pneumonia c Mod Risk Pulmonary embolsim in the differential-patient's well's criteria score is 3 making her at moderate risk based on heart rate close to 100 as well as relative immobility.  Although her chest x-ray suggests bilateral pneumonia given the fact that she is trouble 4 hours back and forth to [place] in the past week I feel it is prudent to rule out a pneumonic process versus a pulmonary embolism given her pain. Her chest pain is pleuritic however and this test would be in objective way of making sure of  this. She also has a risk factor for father having lung cancer She'll be placed on Coumadin to acquired pneumonia coverage and we will obtain the records it lab work including influenza, HIV, Legionella, strep pneumo, sputum culture, blood culture X2 We will continue ceftriaxone as well as azithromycin IV for her and convert her rapidly to by mouth medications if she does better we will give her an incentive spirometer as well  For what I think is her pleuritic pain we will place her on ibuprofen 400 mg every 6 when necessary as she has allergies to codeine which make her lose weight   Hypertension-patient is fine to continue her lisinopril/HCTZ as well as her atenolol-she tells me however that some of her medications make her cough and she may need to be off of her ACE inhibitor because that is a class effect   Full code 45 minutes Inpatient    Nita Sells [facility] Pager [phone]  If 7PM-7AM, please contact night-coverage [email or web address] Password [facility] 10/17/2014, 11:30 PM

## 2014-10-17 NOTE — ED Notes (Signed)
Pt reports left chest pain increases when lying down flat

## 2014-10-17 NOTE — ED Notes (Signed)
Pt c/o shortness of breath for a week, left lower back pain that increases with deep inspiration, and upper mid abdominal pain. Pt denies n/v/d. Pt reports an increase in weakness. Pt states symptoms started a week ago but symptoms intensity increased much more last night.

## 2014-10-18 ENCOUNTER — Encounter (HOSPITAL_COMMUNITY): Payer: Self-pay

## 2014-10-18 DIAGNOSIS — Z9882 Breast implant status: Secondary | ICD-10-CM

## 2014-10-18 DIAGNOSIS — R7302 Impaired glucose tolerance (oral): Secondary | ICD-10-CM

## 2014-10-18 LAB — COMPREHENSIVE METABOLIC PANEL
ALT: 15 U/L (ref 14–54)
AST: 14 U/L — ABNORMAL LOW (ref 15–41)
Albumin: 3 g/dL — ABNORMAL LOW (ref 3.5–5.0)
Alkaline Phosphatase: 79 U/L (ref 38–126)
Anion gap: 11 (ref 5–15)
BUN: 10 mg/dL (ref 6–20)
CALCIUM: 8.8 mg/dL — AB (ref 8.9–10.3)
CHLORIDE: 97 mmol/L — AB (ref 101–111)
CO2: 28 mmol/L (ref 22–32)
CREATININE: 0.67 mg/dL (ref 0.44–1.00)
GFR calc Af Amer: >60 mL/min (ref >60)
GFR calc non Af Amer: >60 mL/min (ref >60)
Glucose, Bld: 241 mg/dL — ABNORMAL HIGH (ref 65–99)
Potassium: 3.8 mmol/L (ref 3.5–5.1)
Sodium: 136 mmol/L (ref 135–145)
TOTAL PROTEIN: 6.2 g/dL — AB (ref 6.5–8.1)
Total Bilirubin: 0.5 mg/dL (ref 0.3–1.2)

## 2014-10-18 LAB — CBC
HCT: 34 % — ABNORMAL LOW (ref 36.0–46.0)
Hemoglobin: 11.7 g/dL — ABNORMAL LOW (ref 12.0–15.0)
MCH: 30 pg (ref 26.0–34.0)
MCHC: 34.4 g/dL (ref 30.0–36.0)
MCV: 87.2 fL (ref 78.0–100.0)
Platelets: 278 10*3/uL (ref 150–400)
RBC: 3.9 MIL/uL (ref 3.87–5.11)
RDW: 11.9 % (ref 11.5–15.5)
WBC: 21.8 10*3/uL — ABNORMAL HIGH (ref 4.0–10.5)

## 2014-10-18 LAB — INFLUENZA PANEL BY PCR (TYPE A & B)
H1N1 flu by pcr: NOT DETECTED
INFLBPCR: NEGATIVE
Influenza A By PCR: NEGATIVE

## 2014-10-18 LAB — PROTIME-INR
INR: 1.31 (ref 0.00–1.49)
Prothrombin Time: 16.4 seconds — ABNORMAL HIGH (ref 11.6–15.2)

## 2014-10-18 LAB — HIV ANTIBODY (ROUTINE TESTING W REFLEX): HIV Screen 4th Generation wRfx: NONREACTIVE

## 2014-10-18 LAB — STREP PNEUMONIAE URINARY ANTIGEN: STREP PNEUMO URINARY ANTIGEN: NEGATIVE

## 2014-10-18 MED ORDER — LISINOPRIL 10 MG PO TABS
10.0000 mg | ORAL_TABLET | Freq: Every day | ORAL | Status: DC
  Administered 2014-10-18 – 2014-10-19 (×2): 10 mg via ORAL
  Filled 2014-10-18 (×2): qty 1

## 2014-10-18 MED ORDER — IBUPROFEN 200 MG PO TABS
400.0000 mg | ORAL_TABLET | Freq: Four times a day (QID) | ORAL | Status: DC | PRN
  Administered 2014-10-18 – 2014-10-19 (×3): 400 mg via ORAL
  Filled 2014-10-18 (×3): qty 2

## 2014-10-18 MED ORDER — FLUTICASONE PROPIONATE 50 MCG/ACT NA SUSP
1.0000 | Freq: Every day | NASAL | Status: DC
  Administered 2014-10-18 – 2014-10-19 (×2): 1 via NASAL
  Filled 2014-10-18: qty 16

## 2014-10-18 MED ORDER — LISINOPRIL-HYDROCHLOROTHIAZIDE 10-12.5 MG PO TABS: 1.0000 | ORAL_TABLET | Freq: Every day | ORAL | Status: DC

## 2014-10-18 MED ORDER — DIPHENHYDRAMINE HCL 25 MG PO CAPS
25.0000 mg | ORAL_CAPSULE | Freq: Every day | ORAL | Status: DC
  Administered 2014-10-18 (×2): 25 mg via ORAL
  Filled 2014-10-18 (×4): qty 1

## 2014-10-18 MED ORDER — MONTELUKAST SODIUM 10 MG PO TABS
10.0000 mg | ORAL_TABLET | Freq: Every day | ORAL | Status: DC
  Administered 2014-10-18 – 2014-10-19 (×2): 10 mg via ORAL
  Filled 2014-10-18 (×2): qty 1

## 2014-10-18 MED ORDER — ATENOLOL 100 MG PO TABS
100.0000 mg | ORAL_TABLET | Freq: Every day | ORAL | Status: DC
  Administered 2014-10-18 (×2): 100 mg via ORAL
  Filled 2014-10-18 (×3): qty 1

## 2014-10-18 MED ORDER — ALBUTEROL SULFATE (2.5 MG/3ML) 0.083% IN NEBU: 2.5000 mg | INHALATION_SOLUTION | Freq: Four times a day (QID) | RESPIRATORY_TRACT | Status: DC | PRN

## 2014-10-18 MED ORDER — PANTOPRAZOLE SODIUM 40 MG PO TBEC
40.0000 mg | DELAYED_RELEASE_TABLET | Freq: Every day | ORAL | Status: DC
  Administered 2014-10-18 – 2014-10-19 (×2): 40 mg via ORAL
  Filled 2014-10-18 (×2): qty 1

## 2014-10-18 MED ORDER — HEPARIN SODIUM (PORCINE) 5000 UNIT/ML IJ SOLN
5000.0000 [IU] | Freq: Three times a day (TID) | INTRAMUSCULAR | Status: DC
  Administered 2014-10-18 – 2014-10-19 (×4): 5000 [IU] via SUBCUTANEOUS
  Filled 2014-10-18 (×9): qty 1

## 2014-10-18 MED ORDER — SODIUM CHLORIDE 0.9 % IV SOLN
INTRAVENOUS | Status: DC
  Administered 2014-10-18: 01:00:00 via INTRAVENOUS

## 2014-10-18 MED ORDER — ZOLPIDEM TARTRATE 5 MG PO TABS: 5.0000 mg | ORAL_TABLET | Freq: Every evening | ORAL | Status: DC | PRN

## 2014-10-18 MED ORDER — DOCUSATE SODIUM 100 MG PO CAPS
100.0000 mg | ORAL_CAPSULE | Freq: Two times a day (BID) | ORAL | Status: DC
  Administered 2014-10-18 – 2014-10-19 (×2): 100 mg via ORAL
  Filled 2014-10-18 (×3): qty 1

## 2014-10-18 MED ORDER — HYDROCHLOROTHIAZIDE 12.5 MG PO CAPS
12.5000 mg | ORAL_CAPSULE | Freq: Every day | ORAL | Status: DC
  Administered 2014-10-18 – 2014-10-19 (×2): 12.5 mg via ORAL
  Filled 2014-10-18 (×4): qty 1

## 2014-10-18 NOTE — Progress Notes (Signed)
Utilization review completed.  

## 2014-10-18 NOTE — Progress Notes (Signed)
Pt arrived at 5W33, transferred from ED. Pt A&O x4. VS stable with exception of HR 102. Complain of left sided chest pain and shoulder pain, stating related to coughing and pneumonia. No pressure ulcer noted on sacral area. Tele and continuous pulse ox placed. Pt oriented to room and equipment.

## 2014-10-18 NOTE — Progress Notes (Signed)
Meghan Alexander PYK:[id number] DOB: 11/09/1955 DOA: 10/17/2014 PCP: Myriam Jacobson, MD  Brief narrative: 59 year old female no real past medical history other than hypertension on dual agents as well as history of tenosynovitis posterior tibial tendon status post injury right foot presented to [facility] emergency room 6/24 with two-week history of cold and cough symptoms. She took zinc and over-the-counter remedies for this About one week prior to admission she went to her mother's home and still had cough and cold which worsened over the past week.  On evaluation in the emergency room found to have tachypnea and chest pain and chest x-ray suggestive of bilateral pneumonia-given the increased work of breathing and severity of pain, CT angiogram performed revealing no pulmonary embolism Started on broad-spectrum community-acquired pneumonia coverage azithromycin/ceftriaxone  Past medical history-As per Problem list Chart reviewed as below- Reviewed  Consultants: None  Procedures: None  Antibiotics: Azithromycin 6/24 Ceftriaxone 6/24   Subjective  Looks, feels a little better denies nausea vomiting chest pain Shortness of breath seems a little improved Still has a pleuritic component of discomfort with deep breaths No diarrhea    Objective    Interim History:   Telemetry: Sinus rhythm    Objective: Filed Vitals:   10/17/14 2300 10/17/14 2330 10/18/14 0107 10/18/14 0606  BP: 102/67 116/81 127/74 113/70  Pulse: 94 93 102 81  Temp:   98.5 F (36.9 C) 98.1 F (36.7 C)  TempSrc:   Oral Oral  Resp: 24 26 18 20   Height:   5\' 2"  (1.575 m)   Weight:   87.68 kg (193 lb 4.8 oz)   SpO2: 93% 94% 93% 94%    Intake/Output Summary (Last 24 hours) at 10/18/14 1408 Last data filed at 10/18/14 1100  Gross per 24 hour  Intake 1163.34 ml  Output    400 ml  Net 763.34 ml    Exam:  General: EOMI NCAT  Cardiovascular: S1-S2 no murmur rub or gallop  Respiratory:  Clinically clear no added sound  Abdomen: Soft nontender nondistended no rebound  Skinno lower extremity edema  Neurogrossly intact moving all 4 limbs equally  Data Reviewed: Basic Metabolic Panel:  Recent Labs Lab 10/17/14 1805 10/18/14 0254  NA 138 136  K 3.7 3.8  CL 101 97*  CO2 25 28  GLUCOSE 150* 241*  BUN 11 10  CREATININE 0.68 0.67  CALCIUM 9.4 8.8*   Liver Function Tests:  Recent Labs Lab 10/17/14 1805 10/18/14 0254  AST 16 14*  ALT 16 15  ALKPHOS 84 79  BILITOT 0.6 0.5  PROT 7.2 6.2*  ALBUMIN 3.5 3.0*    Recent Labs Lab 10/17/14 2123  LIPASE 15*   No results for input(s): AMMONIA in the last 168 hours. CBC:  Recent Labs Lab 10/17/14 1805 10/18/14 0254  WBC 18.0* 21.8*  NEUTROABS 14.4*  --   HGB 12.8 11.7*  HCT 37.8 34.0*  MCV 87.1 87.2  PLT 328 278   Cardiac Enzymes: No results for input(s): CKTOTAL, CKMB, CKMBINDEX, TROPONINI in the last 168 hours. BNP: Invalid input(s): POCBNP CBG: No results for input(s): GLUCAP in the last 168 hours.  No results found for this or any previous visit (from the past 240 hour(s)).   Studies:              All Imaging reviewed and is as per above notation   Scheduled Meds: . atenolol  100 mg Oral QHS  . azithromycin  500 mg Intravenous Q24H  . cefTRIAXone (ROCEPHIN)  IV  1 g Intravenous Q24H  . diphenhydrAMINE  25 mg Oral QHS  . fluticasone  1 spray Each Nare Daily  . heparin  5,000 Units Subcutaneous 3 times per day  . lisinopril  10 mg Oral Daily   And  . hydrochlorothiazide  12.5 mg Oral Daily  . montelukast  10 mg Oral Daily  . pantoprazole  40 mg Oral Daily   Continuous Infusions:    Assessment/Plan:   Community acquired pneumonia -continue IV azithromycin/ceftriaxone 24 hours. Likely transition to [place] for total of 8 days antibiotics ending on 10/24/14 White count is slightly elevated at 21 however I suspect this will all be equilibrated in the next 24 hours  Impaired glucose  tolerance-has been told in the past that she might have risk for diabetes.  I had a brief discussion with her about this given her blood sugar was in the low 200 range Might decide on starting metformin this admission  Body mass index is 35.35 kg/(m^2).-patient needs to lose some weight. I did mention to her she will need follow-up with her primary care physician regarding this  Hypertension-continue atenolol 100 daily at bedtime. She tells me she has a cough with some of her medications and is noted to be on an ACE inhibitor lisinopril 10 mg. It may be beneficial for her to switch to an ARB and I will defer this to primary physician. Continue HCTZ 12.5.  GERD-continue pantoprazole 40 daily  ? Asthma-not sure which is which-has reflux as well-continue montelukast 10 daily Benadryl daily at bedtime when necessary   Code Status: Full  Family Communication: Discussed with daughter at the bedside  Disposition Plan: Out of bed today, DC telemetry, ambulate, saline lock-if continues to look good possible discharge 24-36 hours  Verneita Griffes, MD  [facility] Pager [phone] 10/18/2014, 2:08 PM    LOS: 1 day

## 2014-10-18 NOTE — Progress Notes (Signed)
RN called and received report on pt from ED RN Eme. ED nurse is to give pt antibiotic before pt transferred.

## 2014-10-19 DIAGNOSIS — Z6835 Body mass index (BMI) 35.0-35.9, adult: Secondary | ICD-10-CM

## 2014-10-19 LAB — CBC WITH DIFFERENTIAL/PLATELET
BASOS ABS: 0 10*3/uL (ref 0.0–0.1)
Basophils Relative: 0 % (ref 0–1)
EOS PCT: 1 % (ref 0–5)
Eosinophils Absolute: 0.2 10*3/uL (ref 0.0–0.7)
HEMATOCRIT: 35.1 % — AB (ref 36.0–46.0)
Hemoglobin: 11.6 g/dL — ABNORMAL LOW (ref 12.0–15.0)
LYMPHS PCT: 11 % — AB (ref 12–46)
Lymphs Abs: 1.7 10*3/uL (ref 0.7–4.0)
MCH: 28.9 pg (ref 26.0–34.0)
MCHC: 33 g/dL (ref 30.0–36.0)
MCV: 87.3 fL (ref 78.0–100.0)
Monocytes Absolute: 1.7 10*3/uL — ABNORMAL HIGH (ref 0.1–1.0)
Monocytes Relative: 10 % (ref 3–12)
Neutro Abs: 12.9 10*3/uL — ABNORMAL HIGH (ref 1.7–7.7)
Neutrophils Relative %: 78 % — ABNORMAL HIGH (ref 43–77)
PLATELETS: 326 10*3/uL (ref 150–400)
RBC: 4.02 MIL/uL (ref 3.87–5.11)
RDW: 12 % (ref 11.5–15.5)
WBC: 16.6 10*3/uL — ABNORMAL HIGH (ref 4.0–10.5)

## 2014-10-19 MED ORDER — LEVOFLOXACIN 500 MG PO TABS: 500.0000 mg | ORAL_TABLET | Freq: Every day | ORAL | Status: DC

## 2014-10-19 MED ORDER — LEVOFLOXACIN 500 MG PO TABS
500.0000 mg | ORAL_TABLET | Freq: Every day | ORAL | Status: DC
  Administered 2014-10-19: 500 mg via ORAL
  Filled 2014-10-19: qty 1

## 2014-10-19 NOTE — Progress Notes (Signed)
Nsg Discharge Note  Admit Date:  10/17/2014 Discharge date: 10/19/2014   Tinesha Siegrist to be D/C'd Home per MD order.  AVS completed.  Copy for chart, and copy for patient signed, and dated. Patient/caregiver able to verbalize understanding.  Discharge Medication:   Medication List    STOP taking these medications        ibuprofen 200 MG tablet  Commonly known as:  ADVIL,MOTRIN      TAKE these medications        albuterol 108 (90 BASE) MCG/ACT inhaler  Commonly known as:  PROVENTIL HFA;VENTOLIN HFA  Inhale 1-2 puffs into the lungs every 6 (six) hours as needed for wheezing or shortness of breath.     atenolol 100 MG tablet  Commonly known as:  TENORMIN  Take 100 mg by mouth at bedtime.     diphenhydrAMINE 25 MG tablet  Commonly known as:  BENADRYL  Take 25 mg by mouth at bedtime.     fluticasone 50 MCG/ACT nasal spray  Commonly known as:  FLONASE  Place 1 spray into both nostrils daily.     levofloxacin 500 MG tablet  Commonly known as:  LEVAQUIN  Take 1 tablet (500 mg total) by mouth daily.     lisinopril-hydrochlorothiazide 10-12.5 MG per tablet  Commonly known as:  PRINZIDE,ZESTORETIC  Take 1 tablet by mouth daily.     montelukast 10 MG tablet  Commonly known as:  SINGULAIR  Take 10 mg by mouth daily.     omeprazole 20 MG capsule  Commonly known as:  PRILOSEC  Take 20 mg by mouth daily.        Discharge Assessment: Filed Vitals:   10/19/14 1040  BP: 119/68  Pulse: 82  Temp:   Resp:    Skin clean, dry and intact without evidence of skin break down, no evidence of skin tears noted. IV catheter discontinued intact. Site without signs and symptoms of complications - no redness or edema noted at insertion site, patient denies c/o pain - only slight tenderness at site.  Dressing with slight pressure applied.  D/c Instructions-Education: Discharge instructions given to patient/family with verbalized understanding. D/c education completed with  patient/family including follow up instructions, medication list, d/c activities limitations if indicated, with other d/c instructions as indicated by MD - patient able to verbalize understanding, all questions fully answered. Patient instructed to return to ED, call 911, or call MD for any changes in condition.  Patient escorted via [place], and D/C home via private auto.  Erasmus Bistline Margaretha Sheffield, RN 10/19/2014 3:26 PM

## 2014-10-19 NOTE — Discharge Summary (Signed)
Physician Discharge Summary  Meghan Alexander HYW:[id number] DOB: [birth year] DOA: 10/17/2014  PCP: Myriam Jacobson, MD  Admit date: 10/17/2014 Discharge date: 10/19/2014  Time spent: 20 minutes  Recommendations for Outpatient Follow-up:  1. New medication on discharge was Levaquin for 7 days 500 mg daily to complete course 2. Patient will need A1c and diet counseling as an outpatient 3. Needs Bmet and CBC in when seen week  Discharge Diagnoses:  Principal Problem:   Sepsis-Biolateral PNa c Mod Risk PE in differential Active Problems:   CAP (community acquired pneumonia)   Discharge Condition: Fair  Diet recommendation: Heart healthy low-salt  Filed Weights   10/18/14 0107  Weight: 87.68 kg (193 lb 4.8 oz)    History of present illness:  Brief narrative: 59 year old female no real past medical history other than hypertension on dual agents as well as history of tenosynovitis posterior tibial tendon status post injury right foot presented to [facility] emergency room 6/24 with two-week history of cold and cough symptoms. She took zinc and over-the-counter remedies for this About one week prior to admission she went to her mother's home and still had cough and cold which worsened over the past week. On evaluation in the emergency room found to have tachypnea and chest pain and chest x-ray suggestive of bilateral pneumonia-given the increased work of breathing and severity of pain, CT angiogram performed revealing no pulmonary embolism Started on broad-spectrum community-acquired pneumonia coverage azithromycin/ceftriaxone  She progressed well over the hospital stay and it was felt that she could be transitioned to [place] once her white count dropped and she was afebrile which occurred on 6/26  It was noted that she had impaired glucose tolerance with some high blood sugars and she also had a BMI of 35 and I advised her to follow-up with primary care physician regarding  aggressive weight loss,? Need for metformin and other issues. The patientwas felt to have met clinical and metabolic parameters for safe discharge on 6/26 and will be discharged 6/26   Discharge Exam: Filed Vitals:   10/19/14 1040  BP: 119/68  Pulse: 82  Temp:   Resp:     General: eomi ncat Cardiovascular:  Clear no added soudn Respiratory: s1` s2 no m/r/g  Discharge Instructions   Discharge Instructions    Diet - low sodium heart healthy    Complete by:  As directed      Discharge instructions    Complete by:  As directed   Please complete 7 more days of oral Levaquin for treatment of your pneumonia Please use the incentive spirometer daily for at least the next week as this is important for you to expand her lungs Consider doing light exercise until we feel winded for the next 3-4 days and then gradually increase the intensity of your exercise Please follow-up with your regular physician in about 1 week to 2 weeks-I would also recommend a chest x-ray in about one month to determine clearing of your lungs We discussed that you might have risk for diabetes and I would encourage you to discuss with your physician the need for starting a medication versus aggressive weight loss which is supervised medically by him It was a pleasure meeting you and best of luck to you     Increase activity slowly    Complete by:  As directed           Current Discharge Medication List    START taking these medications   Details  levofloxacin (LEVAQUIN) 500  MG tablet Take 1 tablet (500 mg total) by mouth daily. Qty: 7 tablet, Refills: 0      CONTINUE these medications which have NOT CHANGED   Details  albuterol (PROVENTIL HFA;VENTOLIN HFA) 108 (90 BASE) MCG/ACT inhaler Inhale 1-2 puffs into the lungs every 6 (six) hours as needed for wheezing or shortness of breath. Qty: 1 Inhaler, Refills: 0    atenolol (TENORMIN) 100 MG tablet Take 100 mg by mouth at bedtime.    diphenhydrAMINE  (BENADRYL) 25 MG tablet Take 25 mg by mouth at bedtime.    fluticasone (FLONASE) 50 MCG/ACT nasal spray Place 1 spray into both nostrils daily.    lisinopril-hydrochlorothiazide (PRINZIDE,ZESTORETIC) 10-12.5 MG per tablet Take 1 tablet by mouth daily.     montelukast (SINGULAIR) 10 MG tablet Take 10 mg by mouth daily.     omeprazole (PRILOSEC) 20 MG capsule Take 20 mg by mouth daily.       STOP taking these medications     ibuprofen (ADVIL,MOTRIN) 200 MG tablet        Allergies  Allergen Reactions  . Codeine Nausea And Vomiting and Other (See Comments)    hallucinations  . Hydrocodone Nausea And Vomiting  . Latex Other (See Comments)    Blisters / skin peeling  . Oxycodone Nausea And Vomiting      The results of significant diagnostics from this hospitalization (including imaging, microbiology, ancillary and laboratory) are listed below for reference.    Significant Diagnostic Studies: Dg Chest 2 View  10/17/2014   CLINICAL DATA:  Left-sided chest pain. Coughing. Shortness of breath.  EXAM: CHEST  2 VIEW  COMPARISON:  08/15/2013  FINDINGS: There is a focal area of pulmonary infiltrates in the left lung base with atelectasis with slight elevation of the left hemidiaphragm. Overall heart size and pulmonary vascularity are normal. Minimal atelectasis at the right lung base. Peribronchial thickening on the left.  Slight thoracolumbar scoliosis, unchanged.  IMPRESSION: Infiltrate in the left lung with atelectasis at both lung bases, left more than right.   Electronically Signed   By: Lorriane Shire M.D.   On: 10/17/2014 18:44   Ct Angio Chest Pe W/cm &/or Wo Cm  10/18/2014   CLINICAL DATA:  Chest pain, shortness of breath beginning last night. History of recurrent pneumonia.  EXAM: CT ANGIOGRAPHY CHEST WITH CONTRAST  TECHNIQUE: Multidetector CT imaging of the chest was performed using the standard protocol during bolus administration of intravenous contrast. Multiplanar CT image  reconstructions and MIPs were obtained to evaluate the vascular anatomy.  CONTRAST:  33mL OMNIPAQUE IOHEXOL 350 MG/ML SOLN  COMPARISON:  Chest radiograph October 17, 2014 at 1811 hours  FINDINGS: PULMONARY ARTERY: Adequate contrast opacification of the pulmonary artery's. Main pulmonary artery is not enlarged. No pulmonary arterial filling defects to the level of the subsegmental branches.  MEDIASTINUM: Heart and pericardium are unremarkable, no right heart strain. Thoracic aorta is normal course and caliber, trace calcific atherosclerosis of the aortic arch. Round 13 mm aortopulmonary window lymph node, with additional small rounded pretracheal, aortopulmonary window lymph nodes. Mild LEFT hilar lymphadenopathy. Distended debris-filled esophagus.  LUNGS: Tracheobronchial tree is patent, no pneumothorax. Small LEFT pleural effusion. Lingular pleural-based hypo enhancing masslike 4.6 cm consolidation with mild stranding of the adjacent epicardial fat pad.  SOFT TISSUES AND OSSEOUS STRUCTURES: Included view of the abdomen demonstrates calcified splenic granulomas, a single granuloma LEFT lobe of the liver, the liver is hypodense, likely representing steatosis. Bilateral breast implants. Small reniform lymph nodes in the  bilateral axilla are likely reactive. Mild degenerative change of thoracic spine without destructive bony lesions.  Review of the MIP images confirms the above findings.  IMPRESSION: No acute pulmonary embolism.  Masslike lingular consolidation likely represents pneumonia though, neoplasm not excluded. Small LEFT pleural effusion. Recommend followup. Mediastinal and LEFT hilar lymphadenopathy could be pathologic or, possibly reactive.  Achalasia (debris distended esophagus is an aspiration risk).   Electronically Signed   By: Elon Alas M.D.   On: 10/18/2014 00:10    Microbiology: No results found for this or any previous visit (from the past 240 hour(s)).   Labs: Basic Metabolic  Panel:  Recent Labs Lab 10/17/14 1805 10/18/14 0254  NA 138 136  K 3.7 3.8  CL 101 97*  CO2 25 28  GLUCOSE 150* 241*  BUN 11 10  CREATININE 0.68 0.67  CALCIUM 9.4 8.8*   Liver Function Tests:  Recent Labs Lab 10/17/14 1805 10/18/14 0254  AST 16 14*  ALT 16 15  ALKPHOS 84 79  BILITOT 0.6 0.5  PROT 7.2 6.2*  ALBUMIN 3.5 3.0*    Recent Labs Lab 10/17/14 2123  LIPASE 15*   No results for input(s): AMMONIA in the last 168 hours. CBC:  Recent Labs Lab 10/17/14 1805 10/18/14 0254 10/19/14 0806  WBC 18.0* 21.8* 16.6*  NEUTROABS 14.4*  --  12.9*  HGB 12.8 11.7* 11.6*  HCT 37.8 34.0* 35.1*  MCV 87.1 87.2 87.3  PLT 328 278 326   Cardiac Enzymes: No results for input(s): CKTOTAL, CKMB, CKMBINDEX, TROPONINI in the last 168 hours. BNP: BNP (last 3 results)  Recent Labs  10/17/14 1805  BNP 71.3    ProBNP (last 3 results) No results for input(s): PROBNP in the last 8760 hours.  CBG: No results for input(s): GLUCAP in the last 168 hours.     SignedNita Sells  [facility] 10/19/2014, 11:52 AM

## 2014-10-23 LAB — CULTURE, BLOOD (ROUTINE X 2)
Culture: NO GROWTH
Culture: NO GROWTH

## 2014-11-24 ENCOUNTER — Other Ambulatory Visit: Payer: Self-pay | Admitting: Internal Medicine

## 2014-11-24 ENCOUNTER — Ambulatory Visit
Admission: RE | Admit: 2014-11-24 | Discharge: 2014-11-24 | Disposition: A | Discharge status: Home / Self Care | Payer: BC Managed Care – PPO | Source: Ambulatory Visit | Attending: Internal Medicine | Admitting: Internal Medicine

## 2014-11-24 DIAGNOSIS — R05 Cough: Secondary | ICD-10-CM

## 2014-11-24 DIAGNOSIS — R059 Cough, unspecified: Secondary | ICD-10-CM

## 2015-07-20 ENCOUNTER — Encounter: Payer: Self-pay | Admitting: Internal Medicine

## 2015-09-10 ENCOUNTER — Ambulatory Visit (AMBULATORY_SURGERY_CENTER): Payer: Self-pay | Admitting: *Deleted

## 2015-09-10 ENCOUNTER — Encounter: Payer: Self-pay | Admitting: Internal Medicine

## 2015-09-10 VITALS — Ht 62.0 in | Wt 187.6 lb

## 2015-09-10 DIAGNOSIS — Z1211 Encounter for screening for malignant neoplasm of colon: Secondary | ICD-10-CM

## 2015-09-10 NOTE — Progress Notes (Signed)
No egg or soy allergy known to patient   issues past sedation with any surgeries or procedures of post op N/V , no intubation problems  No diet pills per patient No home 02 use per patient  No blood thinners per patient  Pt denies issues with constipation

## 2015-09-24 ENCOUNTER — Encounter: Payer: BC Managed Care – PPO | Admitting: Internal Medicine

## 2015-10-15 ENCOUNTER — Other Ambulatory Visit: Payer: Self-pay | Admitting: Internal Medicine

## 2015-10-15 ENCOUNTER — Ambulatory Visit
Admission: RE | Admit: 2015-10-15 | Discharge: 2015-10-15 | Disposition: A | Discharge status: Home / Self Care | Payer: Managed Care, Other (non HMO) | Source: Ambulatory Visit | Attending: Internal Medicine | Admitting: Internal Medicine

## 2015-10-15 DIAGNOSIS — R042 Hemoptysis: Secondary | ICD-10-CM

## 2015-10-15 DIAGNOSIS — R9389 Abnormal findings on diagnostic imaging of other specified body structures: Secondary | ICD-10-CM

## 2015-10-22 ENCOUNTER — Ambulatory Visit
Admission: RE | Admit: 2015-10-22 | Discharge: 2015-10-22 | Disposition: A | Discharge status: Home / Self Care | Payer: Managed Care, Other (non HMO) | Source: Ambulatory Visit | Attending: Internal Medicine | Admitting: Internal Medicine

## 2015-10-22 DIAGNOSIS — R9389 Abnormal findings on diagnostic imaging of other specified body structures: Secondary | ICD-10-CM

## 2015-10-22 DIAGNOSIS — R042 Hemoptysis: Secondary | ICD-10-CM

## 2015-10-22 MED ORDER — IOPAMIDOL (ISOVUE-300) INJECTION 61%
75.0000 mL | Freq: Once | INTRAVENOUS | Status: AC | PRN
  Administered 2015-10-22: 75 mL via INTRAVENOUS

## 2015-11-09 ENCOUNTER — Other Ambulatory Visit: Payer: Self-pay | Admitting: Internal Medicine

## 2015-11-09 DIAGNOSIS — R1010 Upper abdominal pain, unspecified: Secondary | ICD-10-CM

## 2015-11-09 DIAGNOSIS — R1310 Dysphagia, unspecified: Secondary | ICD-10-CM

## 2015-11-12 ENCOUNTER — Encounter: Payer: Managed Care, Other (non HMO) | Admitting: Internal Medicine

## 2015-11-12 ENCOUNTER — Ambulatory Visit
Admission: RE | Admit: 2015-11-12 | Discharge: 2015-11-12 | Disposition: A | Discharge status: Home / Self Care | Payer: Managed Care, Other (non HMO) | Source: Ambulatory Visit | Attending: Internal Medicine | Admitting: Internal Medicine

## 2015-11-12 ENCOUNTER — Other Ambulatory Visit: Payer: Self-pay | Admitting: Internal Medicine

## 2015-11-12 DIAGNOSIS — R1310 Dysphagia, unspecified: Secondary | ICD-10-CM

## 2015-11-12 DIAGNOSIS — R1010 Upper abdominal pain, unspecified: Secondary | ICD-10-CM

## 2015-11-17 ENCOUNTER — Encounter: Payer: Self-pay | Admitting: Gastroenterology

## 2015-11-25 ENCOUNTER — Encounter (HOSPITAL_COMMUNITY): Payer: Self-pay | Admitting: *Deleted

## 2015-12-01 ENCOUNTER — Other Ambulatory Visit: Payer: Self-pay | Admitting: Gastroenterology

## 2015-12-01 NOTE — Anesthesia Preprocedure Evaluation (Addendum)
Anesthesia Evaluation  Patient identified by MRN, date of birth, ID band Patient awake    Reviewed: Allergy & Precautions, NPO status , Patient's Chart, lab work & pertinent test results, reviewed documented beta blocker date and time   History of Anesthesia Complications (+) PONV and history of anesthetic complications  Airway Mallampati: III  TM Distance: >3 FB Neck ROM: Full    Dental  (+) Teeth Intact, Dental Advisory Given   Pulmonary neg pulmonary ROS, neg shortness of breath, neg sleep apnea, neg COPD, neg recent URI,    Pulmonary exam normal breath sounds clear to auscultation       Cardiovascular hypertension, Pt. on medications and Pt. on home beta blockers (-) angina(-) Past MI, (-) Cardiac Stents, (-) CABG and (-) Orthopnea  Rhythm:Regular Rate:Normal     Neuro/Psych neg Seizures negative neurological ROS     GI/Hepatic Neg liver ROS, GERD  Medicated,  Endo/Other  negative endocrine ROS  Renal/GU negative Renal ROS     Musculoskeletal  (+) Arthritis ,   Abdominal (+) + obese,   Peds  Hematology negative hematology ROS (+)   Anesthesia Other Findings   Reproductive/Obstetrics                            Anesthesia Physical Anesthesia Plan  ASA: II  Anesthesia Plan: MAC   Post-op Pain Management:    Induction: Intravenous  Airway Management Planned: Natural Airway and Nasal Cannula  Additional Equipment:   Intra-op Plan:   Post-operative Plan:   Informed Consent: I have reviewed the patients History and Physical, chart, labs and discussed the procedure including the risks, benefits and alternatives for the proposed anesthesia with the patient or authorized representative who has indicated his/her understanding and acceptance.   Dental advisory given  Plan Discussed with:   Anesthesia Plan Comments:        Anesthesia Quick Evaluation

## 2015-12-02 ENCOUNTER — Encounter (HOSPITAL_COMMUNITY)
Admission: RE | Disposition: A | Discharge status: Home / Self Care | Payer: Self-pay | Source: Ambulatory Visit | Attending: Gastroenterology

## 2015-12-02 ENCOUNTER — Ambulatory Visit (HOSPITAL_COMMUNITY): Payer: Managed Care, Other (non HMO) | Admitting: Anesthesiology

## 2015-12-02 ENCOUNTER — Encounter (HOSPITAL_COMMUNITY): Payer: Self-pay

## 2015-12-02 ENCOUNTER — Ambulatory Visit (HOSPITAL_COMMUNITY)
Admission: RE | Admit: 2015-12-02 | Discharge: 2015-12-02 | Disposition: A | Discharge status: Home / Self Care | Payer: Managed Care, Other (non HMO) | Source: Ambulatory Visit | Attending: Gastroenterology | Admitting: Gastroenterology

## 2015-12-02 DIAGNOSIS — Z79899 Other long term (current) drug therapy: Secondary | ICD-10-CM | POA: Insufficient documentation

## 2015-12-02 DIAGNOSIS — K222 Esophageal obstruction: Secondary | ICD-10-CM | POA: Insufficient documentation

## 2015-12-02 DIAGNOSIS — M1990 Unspecified osteoarthritis, unspecified site: Secondary | ICD-10-CM | POA: Insufficient documentation

## 2015-12-02 DIAGNOSIS — Z6834 Body mass index (BMI) 34.0-34.9, adult: Secondary | ICD-10-CM | POA: Insufficient documentation

## 2015-12-02 DIAGNOSIS — I10 Essential (primary) hypertension: Secondary | ICD-10-CM | POA: Insufficient documentation

## 2015-12-02 DIAGNOSIS — X58XXXA Exposure to other specified factors, initial encounter: Secondary | ICD-10-CM | POA: Diagnosis not present

## 2015-12-02 DIAGNOSIS — E669 Obesity, unspecified: Secondary | ICD-10-CM | POA: Insufficient documentation

## 2015-12-02 DIAGNOSIS — R1310 Dysphagia, unspecified: Secondary | ICD-10-CM | POA: Diagnosis present

## 2015-12-02 DIAGNOSIS — K219 Gastro-esophageal reflux disease without esophagitis: Secondary | ICD-10-CM | POA: Diagnosis not present

## 2015-12-02 DIAGNOSIS — T18128A Food in esophagus causing other injury, initial encounter: Secondary | ICD-10-CM | POA: Insufficient documentation

## 2015-12-02 DIAGNOSIS — K5710 Diverticulosis of small intestine without perforation or abscess without bleeding: Secondary | ICD-10-CM | POA: Insufficient documentation

## 2015-12-02 DIAGNOSIS — Z7982 Long term (current) use of aspirin: Secondary | ICD-10-CM | POA: Diagnosis not present

## 2015-12-02 HISTORY — PX: ESOPHAGEAL MANOMETRY: SHX5429

## 2015-12-02 HISTORY — PX: ESOPHAGOGASTRODUODENOSCOPY: SHX5428

## 2015-12-02 HISTORY — DX: Nausea with vomiting, unspecified: Z98.890

## 2015-12-02 HISTORY — DX: Other specified postprocedural states: R11.2

## 2015-12-02 HISTORY — DX: Adverse effect of unspecified anesthetic, initial encounter: T41.45XA

## 2015-12-02 HISTORY — DX: Other complications of anesthesia, initial encounter: T88.59XA

## 2015-12-02 SURGERY — EGD (ESOPHAGOGASTRODUODENOSCOPY)
Anesthesia: Monitor Anesthesia Care

## 2015-12-02 SURGERY — MANOMETRY, ESOPHAGUS

## 2015-12-02 MED ORDER — SODIUM CHLORIDE 0.9 % IV SOLN: INTRAVENOUS | Status: DC

## 2015-12-02 MED ORDER — LACTATED RINGERS IV SOLN
INTRAVENOUS | Status: DC
  Administered 2015-12-02: 12:00:00 via INTRAVENOUS

## 2015-12-02 MED ORDER — ONDANSETRON HCL 4 MG/2ML IJ SOLN
INTRAMUSCULAR | Status: AC
  Filled 2015-12-02: qty 2

## 2015-12-02 MED ORDER — LIDOCAINE VISCOUS 2 % MT SOLN
OROMUCOSAL | Status: AC
  Filled 2015-12-02: qty 15

## 2015-12-02 MED ORDER — PROPOFOL 10 MG/ML IV BOLUS
INTRAVENOUS | Status: AC
  Filled 2015-12-02: qty 40

## 2015-12-02 MED ORDER — PROPOFOL 500 MG/50ML IV EMUL
INTRAVENOUS | Status: DC | PRN
  Administered 2015-12-02: 200 ug/kg/min via INTRAVENOUS

## 2015-12-02 MED ORDER — PROPOFOL 10 MG/ML IV BOLUS
INTRAVENOUS | Status: DC | PRN
  Administered 2015-12-02: 30 mg via INTRAVENOUS

## 2015-12-02 MED ORDER — ONDANSETRON HCL 4 MG/2ML IJ SOLN
INTRAMUSCULAR | Status: DC | PRN
  Administered 2015-12-02: 4 mg via INTRAVENOUS

## 2015-12-02 SURGICAL SUPPLY — 2 items
FACESHIELD LNG OPTICON STERILE (SAFETY) IMPLANT
GLOVE BIO SURGEON STRL SZ8 (GLOVE) ×6 IMPLANT

## 2015-12-02 NOTE — Anesthesia Postprocedure Evaluation (Signed)
Anesthesia Post Note  Patient: Eudell Holzknecht  Procedure(s) Performed: Procedure(s) (LRB): ESOPHAGOGASTRODUODENOSCOPY (EGD) (N/A)  Patient location during evaluation: PACU Anesthesia Type: MAC Level of consciousness: awake and alert Pain management: pain level controlled Vital Signs Assessment: post-procedure vital signs reviewed and stable Respiratory status: spontaneous breathing, nonlabored ventilation, respiratory function stable and patient connected to nasal cannula oxygen Cardiovascular status: stable and blood pressure returned to baseline Anesthetic complications: no    Last Vitals:  Vitals:   12/02/15 1320 12/02/15 1330  BP: (!) 162/89 (!) 160/77  Pulse: 62 61  Resp: 15 17  Temp:      Last Pain:  Vitals:   12/02/15 1054  TempSrc: Oral                 Aleni Andrus L

## 2015-12-02 NOTE — Discharge Instructions (Signed)

## 2015-12-02 NOTE — H&P (Signed)
Patient interval history reviewed.  Patient examined again.  There has been no change from documented H/P dated 11/19/15 (scanned into chart from our office) except as documented above.  Assessment:  1.  Dysphagia, concern for achalasia.  Plan:  1. Endoscopy. 2. Risks (bleeding, infection, bowel perforation that could require surgery, sedation-related changes in cardiopulmonary systems), benefits (identification and possible treatment of source of symptoms, exclusion of certain causes of symptoms), and alternatives (watchful waiting, radiographic imaging studies, empiric medical treatment) of upper endoscopy (EGD) were explained to patient/family in detail and patient wishes to proceed.

## 2015-12-02 NOTE — Progress Notes (Signed)
Esophageal Manometry done per protocol. Patient tolerated well with drinking very small amt of water less than 2oz. Report to be sent to Dr. Erlinda Hong office.

## 2015-12-02 NOTE — Op Note (Signed)
[facility] Patient Name: Meghan Alexander Procedure Date: 12/02/2015 MRN: [id number] Attending MD: [name] , MD Date of Birth: November 28, 1955 CSN: [id number] Age: 60 Admit Type: Outpatient Procedure:                Upper GI endoscopy Indications:              Dysphagia, Abnormal cine-esophagram Providers:                [name], MD, Malka So, RN, Cherylynn Ridges, Technician, Virgia Land, CRNA Referring MD:             [name], MD Medicines:                Monitored Anesthesia Care Complications:            No immediate complications. Estimated Blood Loss:     Estimated blood loss: none. Procedure:                Pre-Anesthesia Assessment:                           - Prior to the procedure, a History and Physical                            was performed, and patient medications and                            allergies were reviewed. The patient's tolerance of                            previous anesthesia was also reviewed. The risks                            and benefits of the procedure and the sedation                            options and risks were discussed with the patient.                            All questions were answered, and informed consent                            was obtained. Prior Anticoagulants: The patient has                            taken no previous anticoagulant or antiplatelet                            agents. ASA Grade Assessment: II - A patient with                            mild systemic disease. After reviewing the risks  and benefits, the patient was deemed in                            satisfactory condition to undergo the procedure.                           After obtaining informed consent, the endoscope was                            passed under direct vision. Throughout the                            procedure, the patient's blood pressure, pulse, and                             oxygen saturations were monitored continuously. The                            [id number] scope was introduced through the                            mouth, and advanced to the second part of duodenum.                            The upper GI endoscopy was accomplished without                            difficulty. The patient tolerated the procedure                            well. Scope In: Scope Out: Findings:      The lumen of the esophagus was moderately dilated.      Food was found in the mid esophagus and in the distal esophagus. Removal       of food was accomplished.      One moderate benign-appearing, intrinsic stenosis was found at the GE       junction. Consistent with achalasia. No underlying mucosal lesion was       seen. Endoscope was able to traverse this region.      The exam of the esophagus was otherwise normal.      The entire examined stomach was normal, including retroflexion into the       cardia, where no evidence of tumor was identified.      A diverticulum was found in the second portion of the duodenum.      The exam of the duodenum was otherwise normal. Impression:               - Dilation in the entire esophagus.                           - Food in the mid esophagus and in the distal                            esophagus. Removal was successful.                           -  Benign-appearing esophageal stenosis consistent                            with tight GE junction from achalasia.                           - Normal stomach.                           - Duodenal diverticulum.                           - The examination was otherwise normal. No                            pseudoachalasia seen. Overall constellation of                            findings most consistent with Type II achalasia. Moderate Sedation:      None Recommendation:           - Patient has a contact number available for                             emergencies. The signs and symptoms of potential                            delayed complications were discussed with the                            patient. Return to normal activities tomorrow.                            Written discharge instructions were provided to the                            patient.                           - Discharge patient to home (via wheelchair).                           - Full liquid diet until further notice.                           - Continue present medications.                           - Refer to Dr. Garner Nash at [facility] for                            consideration of Heller myotomy.                           - Return to GI office PRN.                           -  Patient will ultimately need screening                            colonoscopy, after definitive treatment for her                            achalasia. Procedure Code(s):        --- Professional ---                           [phone], Esophagogastroduodenoscopy, flexible,                            transoral; with removal of foreign body(s) Diagnosis Code(s):        --- Professional ---                           K22.8, Other specified diseases of esophagus                           T18.128A, Food in esophagus causing other injury,                            initial encounter                           K22.2, Esophageal obstruction                           R13.10, Dysphagia, unspecified                           K57.10, Diverticulosis of small intestine without                            perforation or abscess without bleeding                           R93.3, Abnormal findings on diagnostic imaging of                            other parts of digestive tract CPT copyright 2016 [employer]. All rights reserved. The codes documented in this report are preliminary and upon coder review may  be revised to meet current compliance requirements. [name], MD 12/02/2015  1:05:34 PM This report has been signed electronically. Number of Addenda: 0

## 2015-12-02 NOTE — Transfer of Care (Signed)
Immediate Anesthesia Transfer of Care Note  Patient: Meghan Alexander  Procedure(s) Performed: Procedure(s): ESOPHAGOGASTRODUODENOSCOPY (EGD) (N/A)  Patient Location: PACU  Anesthesia Type:MAC  Level of Consciousness:  sedated, patient cooperative and responds to stimulation  Airway & Oxygen Therapy:Patient Spontanous Breathing and Patient connected to face mask oxgen  Post-op Assessment:  Report given to PACU RN and Post -op Vital signs reviewed and stable  Post vital signs:  Reviewed and stable  Last Vitals:  Vitals:   12/02/15 1054  BP: (!) 141/83  Pulse: 71  Resp: 14  Temp: [id number] C    Complications: No apparent anesthesia complications

## 2015-12-03 ENCOUNTER — Encounter (HOSPITAL_COMMUNITY): Payer: Self-pay | Admitting: Gastroenterology

## 2016-01-25 ENCOUNTER — Ambulatory Visit: Payer: Managed Care, Other (non HMO) | Admitting: Gastroenterology

## 2016-06-07 ENCOUNTER — Other Ambulatory Visit: Payer: Self-pay | Admitting: Internal Medicine

## 2016-06-07 DIAGNOSIS — R911 Solitary pulmonary nodule: Secondary | ICD-10-CM

## 2016-06-13 ENCOUNTER — Ambulatory Visit
Admission: RE | Admit: 2016-06-13 | Discharge: 2016-06-13 | Disposition: A | Discharge status: Home / Self Care | Payer: Managed Care, Other (non HMO) | Source: Ambulatory Visit | Attending: Internal Medicine | Admitting: Internal Medicine

## 2016-06-13 DIAGNOSIS — R911 Solitary pulmonary nodule: Secondary | ICD-10-CM

## 2018-09-25 ENCOUNTER — Other Ambulatory Visit: Payer: Self-pay | Admitting: Orthopedic Surgery

## 2018-09-25 DIAGNOSIS — M545 Low back pain, unspecified: Secondary | ICD-10-CM

## 2018-09-26 ENCOUNTER — Other Ambulatory Visit: Payer: Managed Care, Other (non HMO)

## 2018-10-08 ENCOUNTER — Other Ambulatory Visit: Payer: Self-pay | Admitting: Orthopedic Surgery

## 2018-10-11 ENCOUNTER — Other Ambulatory Visit: Payer: Self-pay

## 2018-10-11 ENCOUNTER — Emergency Department (HOSPITAL_COMMUNITY)
Admission: EM | Admit: 2018-10-11 | Discharge: 2018-10-11 | Disposition: A | Discharge status: Home / Self Care | Payer: Medicare Other | Attending: Emergency Medicine | Admitting: Emergency Medicine

## 2018-10-11 ENCOUNTER — Encounter (HOSPITAL_COMMUNITY): Payer: Self-pay | Admitting: Emergency Medicine

## 2018-10-11 DIAGNOSIS — R197 Diarrhea, unspecified: Secondary | ICD-10-CM | POA: Diagnosis not present

## 2018-10-11 DIAGNOSIS — Z79899 Other long term (current) drug therapy: Secondary | ICD-10-CM | POA: Diagnosis not present

## 2018-10-11 DIAGNOSIS — I10 Essential (primary) hypertension: Secondary | ICD-10-CM | POA: Diagnosis not present

## 2018-10-11 DIAGNOSIS — Z91040 Latex allergy status: Secondary | ICD-10-CM | POA: Insufficient documentation

## 2018-10-11 DIAGNOSIS — K529 Noninfective gastroenteritis and colitis, unspecified: Secondary | ICD-10-CM | POA: Diagnosis not present

## 2018-10-11 DIAGNOSIS — R109 Unspecified abdominal pain: Secondary | ICD-10-CM | POA: Diagnosis present

## 2018-10-11 DIAGNOSIS — R112 Nausea with vomiting, unspecified: Secondary | ICD-10-CM | POA: Diagnosis not present

## 2018-10-11 LAB — COMPREHENSIVE METABOLIC PANEL
ALT: 36 U/L (ref 0–44)
AST: 25 U/L (ref 15–41)
Albumin: 4.4 g/dL (ref 3.5–5.0)
Alkaline Phosphatase: 61 U/L (ref 38–126)
Anion gap: 11 (ref 5–15)
BUN: 13 mg/dL (ref 8–23)
CO2: 25 mmol/L (ref 22–32)
Calcium: 9.5 mg/dL (ref 8.9–10.3)
Chloride: 103 mmol/L (ref 98–111)
Creatinine, Ser: 0.7 mg/dL (ref 0.44–1.00)
GFR calc Af Amer: >60 mL/min (ref >60)
GFR calc non Af Amer: >60 mL/min (ref >60)
Glucose, Bld: 212 mg/dL — ABNORMAL HIGH (ref 70–99)
Potassium: 3.3 mmol/L — ABNORMAL LOW (ref 3.5–5.1)
Sodium: 139 mmol/L (ref 135–145)
Total Bilirubin: 0.7 mg/dL (ref 0.3–1.2)
Total Protein: 6.9 g/dL (ref 6.5–8.1)

## 2018-10-11 LAB — URINALYSIS, ROUTINE W REFLEX MICROSCOPIC
Bilirubin Urine: NEGATIVE
Glucose, UA: NEGATIVE mg/dL
Hgb urine dipstick: NEGATIVE
Ketones, ur: NEGATIVE mg/dL
Leukocytes,Ua: NEGATIVE
Nitrite: NEGATIVE
Protein, ur: NEGATIVE mg/dL
Specific Gravity, Urine: 1.014 (ref 1.005–1.030)
pH: 6 (ref 5.0–8.0)

## 2018-10-11 LAB — CBC WITH DIFFERENTIAL/PLATELET
Abs Immature Granulocytes: 0.03 10*3/uL (ref 0.00–0.07)
Basophils Absolute: 0.1 10*3/uL (ref 0.0–0.1)
Basophils Relative: 1 %
Eosinophils Absolute: 0.1 10*3/uL (ref 0.0–0.5)
Eosinophils Relative: 1 %
HCT: 44.3 % (ref 36.0–46.0)
Hemoglobin: 14.9 g/dL (ref 12.0–15.0)
Immature Granulocytes: 0 %
Lymphocytes Relative: 23 %
Lymphs Abs: 2.3 10*3/uL (ref 0.7–4.0)
MCH: 30 pg (ref 26.0–34.0)
MCHC: 33.6 g/dL (ref 30.0–36.0)
MCV: 89.3 fL (ref 80.0–100.0)
Monocytes Absolute: 0.7 10*3/uL (ref 0.1–1.0)
Monocytes Relative: 7 %
Neutro Abs: 7.1 10*3/uL (ref 1.7–7.7)
Neutrophils Relative %: 68 %
Platelets: 185 10*3/uL (ref 150–400)
RBC: 4.96 MIL/uL (ref 3.87–5.11)
RDW: 11.7 % (ref 11.5–15.5)
WBC: 10.3 10*3/uL (ref 4.0–10.5)
nRBC: 0 % (ref 0.0–0.2)

## 2018-10-11 LAB — LIPASE, BLOOD: Lipase: 29 U/L (ref 11–51)

## 2018-10-11 MED ORDER — ONDANSETRON HCL 4 MG/2ML IJ SOLN
4.0000 mg | Freq: Once | INTRAMUSCULAR | Status: AC
  Administered 2018-10-11: 4 mg via INTRAVENOUS
  Filled 2018-10-11: qty 2

## 2018-10-11 MED ORDER — SODIUM CHLORIDE 0.9 % IV BOLUS
1000.0000 mL | Freq: Once | INTRAVENOUS | Status: AC
  Administered 2018-10-11: 1000 mL via INTRAVENOUS

## 2018-10-11 NOTE — ED Provider Notes (Signed)
[facility] EMERGENCY DEPARTMENT Provider Note   CSN: [id number] Arrival date & time: 10/11/18  0347    History   Chief Complaint No chief complaint on file.   HPI Meghan Alexander is a 63 y.o. female who presents to the ED today complaining of gradual onset, constant, achy, diffuse abdominal pain that began around 3 PM yesterday. Pt also complaining of nausea, nonbloody nonbilious emesis, and diarrhea. Pt reports 1 episode of BRBPR this AM; she called her PCP who told her to come to the ED for further evaluation. Pt reports she ate a beef and cheese sub yesterday 2 hours prior to her symptoms beginning and thinks she may have food poisoning. No one else in the house ate the same thing. No recent abx use. Pt does not drink EtOH. No excessive NSAID use. Denies fever, chills, chest pain, shortness of breath, urinary sx.        Past Medical History:  Diagnosis Date  . Allergy    year around allergies  . Arthritis    fingers, knees, neck, back-osteoarthristis  . Bronchitis    hx of  . Complication of anesthesia   . GERD (gastroesophageal reflux disease)    hx of reflux-went away with elevating HOB   . Hypertension   . Pneumonia    hx of   . PONV (postoperative nausea and vomiting)     Patient Active Problem List   Diagnosis Date Noted  . Sepsis-Biolateral PNa c Mod Risk PE in differential 10/17/2014  . CAP (community acquired pneumonia) 10/17/2014  . Pain in right foot 06/28/2013  . Posterior tibial tendonitis of right leg 06/28/2013    Past Surgical History:  Procedure Laterality Date  . ABDOMINAL HYSTERECTOMY    . ADENOIDECTOMY    . cosmetic surgeries    . ESOPHAGEAL MANOMETRY N/A 12/02/2015   Procedure: ESOPHAGEAL MANOMETRY (EM);  Surgeon: [name], MD;  Location: [facility];  Service: Endoscopy;  Laterality: N/A;  . ESOPHAGOGASTRODUODENOSCOPY N/A 12/02/2015   Procedure: ESOPHAGOGASTRODUODENOSCOPY (EGD);  Surgeon: [name], MD;  Location:  [facility];  Service: Endoscopy;  Laterality: N/A;  . lymph node removal left leg     cat scratch surgery   . THROAT SURGERY     sleep apnea surgery   . TONSILLECTOMY       OB History   No obstetric history on file.      Home Medications    Prior to Admission medications   Medication Sig Start Date End Date Taking? Authorizing Provider  Biotin 7500 MCG TABS Take 1 tablet by mouth daily.   Yes [provider]  diphenhydrAMINE (BENADRYL) 25 MG tablet Take 25 mg by mouth at bedtime.   Yes [provider]  Lactobacillus-Inulin (CULTURELLE DIGESTIVE HEALTH PO) Take 1 capsule by mouth at bedtime.   Yes [provider]  lisinopril-hydrochlorothiazide (PRINZIDE,ZESTORETIC) 10-12.5 MG per tablet Take 1 tablet by mouth daily.  06/17/13  Yes [provider]  Melatonin 10 MG CAPS Take 10 mg by mouth at bedtime.    Yes [provider]  metoprolol succinate (TOPROL-XL) 50 MG 24 hr tablet Take 50 mg by mouth daily. 12/21/15  Yes [provider]  Multiple Vitamin (MULTIVITAMIN) tablet Take 1 tablet by mouth daily.   Yes [provider]  rosuvastatin (CRESTOR) 10 MG tablet Take 10 mg by mouth daily.   Yes [provider]    Family History Family History  Problem Relation Age of Onset  . Lung cancer  Father        1982 died from it  . Hernia Mother   . Breast cancer Maternal Aunt        multiple   . Breast cancer Maternal Grandmother   . Breast cancer Paternal Grandmother   . Breast cancer Cousin        cousins multiple   . Colon cancer Neg Hx   . Colon polyps Neg Hx   . Rectal cancer Neg Hx   . Stomach cancer Neg Hx     Social History Social History   Tobacco Use  . Smoking status: Never Smoker  . Smokeless tobacco: Never Used  Substance Use Topics  . Alcohol use: No    Alcohol/week: 0.0 standard drinks  . Drug use: No     Allergies   Codeine, Hydrocodone, Latex, and Oxycodone   Review of Systems  Review of Systems  Constitutional: Negative for chills and fever.  HENT: Negative for congestion.   Eyes: Negative for redness.  Respiratory: Negative for cough and shortness of breath.   Cardiovascular: Negative for chest pain.  Gastrointestinal: Positive for abdominal pain, blood in stool, diarrhea, nausea and vomiting. Negative for constipation.  Genitourinary: Negative for dysuria and frequency.  Musculoskeletal: Negative for myalgias.  Skin: Negative for rash.  Neurological: Negative for headaches.     Physical Exam Updated Vital Signs BP (!) 164/96 (BP Location: Right Arm)   Pulse 93   Temp 99.1 F (37.3 C) (Oral)   Resp 18   Ht 5\' 2"  (1.575 m)   Wt 81.6 kg   SpO2 97%   BMI 32.92 kg/m   Physical Exam Vitals signs and nursing note reviewed.  Constitutional:      Appearance: She is not ill-appearing.  HENT:     Head: Normocephalic and atraumatic.  Eyes:     Conjunctiva/sclera: Conjunctivae normal.  Neck:     Musculoskeletal: Neck supple.  Cardiovascular:     Rate and Rhythm: Normal rate and regular rhythm.     Pulses: Normal pulses.  Pulmonary:     Effort: Pulmonary effort is normal.     Breath sounds: Normal breath sounds. No wheezing, rhonchi or rales.  Abdominal:     General: Bowel sounds are normal. There is no distension.     Palpations: Abdomen is soft.     Tenderness: There is abdominal tenderness. There is no guarding or rebound.     Comments: Very minimal tenderness to palpation diffusely; no rebound or guarding   Skin:    General: Skin is warm and dry.  Neurological:     Mental Status: She is alert.      ED Treatments / Results  Labs (all labs ordered are listed, but only abnormal results are displayed) Labs Reviewed  COMPREHENSIVE METABOLIC PANEL - Abnormal; Notable for the following components:      Result Value   Potassium 3.3 (*)    Glucose, Bld 212 (*)    All other components within normal limits  URINALYSIS, ROUTINE W REFLEX  MICROSCOPIC - Abnormal; Notable for the following components:   APPearance HAZY (*)    All other components within normal limits  LIPASE, BLOOD  CBC WITH DIFFERENTIAL/PLATELET    EKG EKG Interpretation  Date/Time:  Thursday October 11 2018 09:47:56 EDT Ventricular Rate:  85 PR Interval:    QRS Duration: 91 QT Interval:  385 QTC Calculation: 458 R Axis:   64 Text Interpretation:  Sinus rhythm Borderline prolonged PR interval No significant change  since last tracing Confirmed by Wandra Arthurs [phone]) on 10/11/2018 10:44:47 AM   Radiology No results found.  Procedures Procedures (including critical care time)  Medications Ordered in ED Medications  sodium chloride 0.9 % bolus 1,000 mL (0 mLs Intravenous Stopped 10/11/18 1035)  ondansetron (ZOFRAN) injection 4 mg (4 mg Intravenous Given 10/11/18 0944)     Initial Impression / Assessment and Plan / ED Course  I have reviewed the triage vital signs and the nursing notes.  Pertinent labs & imaging results that were available during my care of the patient were reviewed by me and considered in my medical decision making (see chart for details).    Pt is a 63 year old female who presents with abdominal pain, N/V/D after eating a sandwich yesterday afternoon. She reports 1 episode of BRBPR this AM which concerned her; no fever or chills. No recent abx use to suggest c diff. No EtOH use. No excessive NSAID use. Pt does have hx of achalasia; had surgery to correct this about 2 years ago. She is afebrile in the ED; not tachycardic or tachypneic. Does not meet SIRS criteria currently. She has very minimal tenderness on palpation without peritoneal signs; very low suspicion for acute abdomen. Will order CBC, CMP, lipase, and U/A today; if any concerning lab values will consider CT A/P. 1 L NS bolus given as well as Zofran for symptomatic relief.    Workup essentially negative today; potassium mildly low at 3.3 but suspect this is from excessive  vomiting and diarrhea. No other electrolyte derangements. No leukocytosis to suggest infection. U/A without infection as well. Fecal occult initially ordered and DRE performed; discontinued after discharge; unclear what happened to sample. Upon reevaluation patient feeling better after IVF and antiemetics. Will discharge home with PCP follow up. Patient not sent home with imodium at this time given concern for infectious diarrhea/do not want to prolong symptoms. She is in agreement with plan at this time and stable for discharge home.       Final Clinical Impressions(s) / ED Diagnoses   Final diagnoses:  Gastroenteritis    ED Discharge Orders    None       Eustaquio Maize, PA-C 10/11/18 1602    Drenda Freeze, MD 10/12/18 1135

## 2018-10-11 NOTE — ED Triage Notes (Signed)
Pt in with c/o n/v/d since waking early this am. States she thinks she has food poisoning d/t "bad beef and cheese sub". Had episode of bright red bloody diarrhea PTA, multiple episodes of emesis. C/o mild abdominal pain

## 2018-10-11 NOTE — ED Notes (Signed)
Patient verbalizes understanding of discharge instructions. Opportunity for questioning and answering were provided.  patient discharged from ED.  

## 2018-10-11 NOTE — Discharge Instructions (Addendum)
Please follow up with your PCP regarding your symptoms. This is likely due to a stomach bug; if your symptoms persist for > 10 days you may need to be placed on antibiotics/have a stool sample collected. This can be done by your PCP. Please follow bland diet resource attached to your discharge paperwork.

## 2018-11-02 ENCOUNTER — Ambulatory Visit
Admit: 2018-11-02 | Discharge: 2018-11-02 | Disposition: A | Discharge status: Home / Self Care | Payer: Medicare Other | Attending: Orthopedic Surgery | Admitting: Orthopedic Surgery

## 2018-11-02 DIAGNOSIS — M545 Low back pain, unspecified: Secondary | ICD-10-CM

## 2018-11-14 ENCOUNTER — Telehealth: Payer: Self-pay | Admitting: Hematology

## 2018-11-14 NOTE — Telephone Encounter (Signed)
Patient returned call re scheduling new patient visit.  Confirmed 7/28 @ 11 am with Dr. Irene Limbo.

## 2018-11-19 NOTE — Progress Notes (Signed)
HEMATOLOGY/ONCOLOGY CONSULTATION NOTE  Date of Service: 11/20/2018  Patient Care Team: [name], MD as PCP - General (Internal Medicine)  CHIEF COMPLAINTS/PURPOSE OF CONSULTATION:  Retroperitoneal Lymphadenopathy   HISTORY OF PRESENTING ILLNESS:   Meghan Alexander is a wonderful 63 y.o. female who has been referred to [place] by Dr Layne Benton for evaluation and management of possible lymphoproliferative disease.The pt reports that she is doing well overall.  The pt reports that she had an MRI earlier this month because of her back pain. She has had back pain for about 30 years, but in May, it got so bad that she could not move. The pain radiated around her hip and down to her knee. She could not even get in her car to go to the ER. She put herself on bedrest for 5 weeks which improved her symptoms. Now, her back pain is back to baseline.  Pt reported that, in the past 6 months, she has been moving her bowels 3-4x daily, which is much more than normal for her. Throughout the day, her stools get progressively looser. She went to the ER on 10/11/2018 for food poisoning from mayonnaise because she had blood in the stool and cramping. The symptoms began 2 hours after she ate the mayo. She was not placed on antibiotics or any other medications. No stool testing was done. Her symptoms resolved in 2 days.  She experienced night sweats up until 2006, and they returned 6 months ago. She does not take hormone replacements because of her FHx of breast cancer. She also reports some new fatigue, dry eyes, and headaches over the last 6 months. Denies unexpected weight loss. She has not been diagnosed with diabetes.  Of note prior to the patient's visit today, pt has had MRI lumbar spine wo contrast completed on 11/02/2018 with results revealing "Extensive retroperitoneal lymphadenopathy highly worrisome for neoplastic process such as lymphoproliferative disease. 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 There is mild central canal narrowing overall at this Level."  Most recent lab results (10/11/2018) of CBC is as follows: all values are WNL except for Potassium at 3.3, glucose bld at 212. 10/11/2018 Urinalysis revealed hazy appearance 10/11/2018 Lipase at 29  On review of systems, pt reports back pain, 3-4 bowel movements daily, dry eyes, headaches, night sweats, and denies unexpected weight loss, mouth sores, belly pain, changes in urination trouble swallowing, fever, chills, and any other symptoms.   On PMHx the pt reports total hysterectomy at 38 years ago due to ovarian cysts, lymphoreticulosis, sleep apnea related surgery, breast augmentation with no removal of breast tissue On Social Hx the pt reports never smoking On Family Hx the pt reports breast cancer on both sides of the family.   MEDICAL HISTORY:  Past Medical History:  Diagnosis Date   Allergy    year around allergies   Arthritis    fingers, knees, neck, back-osteoarthristis   Bronchitis    hx of   Complication of anesthesia    GERD (gastroesophageal reflux disease)    hx of reflux-went away with elevating HOB    Hypertension    Pneumonia    hx of    PONV (postoperative nausea and vomiting)     SURGICAL HISTORY: Past Surgical History:  Procedure Laterality Date   ABDOMINAL HYSTERECTOMY     ADENOIDECTOMY  cosmetic surgeries     ESOPHAGEAL MANOMETRY N/A 12/02/2015   Procedure: ESOPHAGEAL MANOMETRY (EM);  Surgeon: [name], MD;  Location: [facility];  Service: Endoscopy;  Laterality: N/A;   ESOPHAGOGASTRODUODENOSCOPY N/A 12/02/2015   Procedure: ESOPHAGOGASTRODUODENOSCOPY (EGD);  Surgeon: [name], MD;  Location: [facility];  Service: Endoscopy;  Laterality: N/A;   lymph node removal left leg     cat scratch surgery     THROAT SURGERY     sleep apnea surgery    TONSILLECTOMY      SOCIAL HISTORY: Social History   Socioeconomic History   Marital status: Married    Spouse name: Not on file   Number of children: Not on file   Years of education: Not on file   Highest education level: Not on file  Occupational History   Not on file  Social Needs   Financial resource strain: Not on file   Food insecurity    Worry: Not on file    Inability: Not on file   Transportation needs    Medical: Not on file    Non-medical: Not on file  Tobacco Use   Smoking status: Never Smoker   Smokeless tobacco: Never Used  Substance and Sexual Activity   Alcohol use: No    Alcohol/week: 0.0 standard drinks   Drug use: No   Sexual activity: Not on file  Lifestyle   Physical activity    Days per week: Not on file    Minutes per session: Not on file   Stress: Not on file  Relationships   Social connections    Talks on phone: Not on file    Gets together: Not on file    Attends religious service: Not on file    Active member of club or organization: Not on file    Attends meetings of clubs or organizations: Not on file    Relationship status: Not on file   Intimate partner violence    Fear of current or ex partner: Not on file    Emotionally abused: Not on file    Physically abused: Not on file    Forced sexual activity: Not on file  Other Topics Concern   Not on file  Social History Narrative   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 [employer]   Never smoker never drinker   No drugs   Multiple allergies   Lives at home with her husband of 8 years since 2008    FAMILY HISTORY: Family History  Problem Relation Age of Onset   Lung cancer Father        1982 died from it   Hernia Mother    Breast cancer Maternal Aunt        multiple    Breast cancer Maternal Grandmother    Breast cancer Paternal Grandmother    Breast cancer Cousin         cousins multiple    Colon cancer Neg Hx    Colon polyps Neg Hx    Rectal cancer Neg Hx    Stomach cancer Neg Hx     ALLERGIES:  is allergic to codeine; hydrocodone; latex; and oxycodone.  MEDICATIONS:  Current Outpatient Medications  Medication Sig Dispense Refill   Biotin 7500 MCG TABS Take 1 tablet by mouth daily.     diphenhydrAMINE (BENADRYL) 25 MG tablet Take 25 mg by mouth at bedtime.     Lactobacillus-Inulin (CULTURELLE DIGESTIVE HEALTH PO) Take 1 capsule by mouth at bedtime.  lisinopril-hydrochlorothiazide (PRINZIDE,ZESTORETIC) 10-12.5 MG per tablet Take 1 tablet by mouth daily.      Melatonin 10 MG CAPS Take 10 mg by mouth at bedtime.      metoprolol succinate (TOPROL-XL) 50 MG 24 hr tablet Take 50 mg by mouth daily.     Multiple Vitamin (MULTIVITAMIN) tablet Take 1 tablet by mouth daily.     rosuvastatin (CRESTOR) 10 MG tablet Take 10 mg by mouth daily.     No current facility-administered medications for this visit.     REVIEW OF SYSTEMS:    A 10+ POINT REVIEW OF SYSTEMS WAS OBTAINED including neurology, dermatology, psychiatry, cardiac, respiratory, lymph, extremities, GI, GU, Musculoskeletal, constitutional, breasts, reproductive, HEENT.  All pertinent positives are noted in the HPI.  All others are negative.   PHYSICAL EXAMINATION: ECOG PERFORMANCE STATUS: 1 - Symptomatic but completely ambulatory  . Vitals:   11/20/18 1042  BP: 140/79  Pulse: 87  Resp: 18  Temp: 98.5 F (36.9 C)  SpO2: 97%   Filed Weights   11/20/18 1042  Weight: 192 lb 4.8 oz (87.2 kg)   .Body mass index is 35.17 kg/m.  GENERAL:alert, in no acute distress and comfortable SKIN: no acute rashes, no significant lesions EYES: conjunctiva are pink and non-injected, sclera anicteric OROPHARYNX: MMM, no exudates, no oropharyngeal erythema or ulceration NECK: supple, no JVD LYMPH:  no palpable lymphadenopathy in the cervical, axillary or inguinal regions LUNGS: clear to  auscultation b/l with normal respiratory effort HEART: regular rate & rhythm ABDOMEN:  normoactive bowel sounds , non tender, not distended. Extremity: no pedal edema PSYCH: alert & oriented x 3 with fluent speech NEURO: no focal motor/sensory deficits  LABORATORY DATA:  I have reviewed the data as listed  . CBC Latest Ref Rng & Units 11/20/2018 10/11/2018 10/19/2014  WBC 4.0 - 10.5 K/uL 8.6 10.3 16.6(H)  Hemoglobin 12.0 - 15.0 g/dL 14.6 14.9 11.6(L)  Hematocrit 36.0 - 46.0 % 42.3 44.3 35.1(L)  Platelets 150 - 400 K/uL 187 185 326    . CMP Latest Ref Rng & Units 11/20/2018 10/11/2018 10/18/2014  Glucose 70 - 99 mg/dL 119(H) 212(H) 241(H)  BUN 8 - 23 mg/dL 9 13 10   Creatinine 0.44 - 1.00 mg/dL 0.75 0.70 0.67  Sodium 135 - 145 mmol/L 139 139 136  Potassium 3.5 - 5.1 mmol/L 3.9 3.3(L) 3.8  Chloride 98 - 111 mmol/L 103 103 97(L)  CO2 22 - 32 mmol/L 25 25 28   Calcium 8.9 - 10.3 mg/dL 9.9 9.5 8.8(L)  Total Protein 6.5 - 8.1 g/dL 7.5 6.9 6.2(L)  Total Bilirubin 0.3 - 1.2 mg/dL 0.4 0.7 0.5  Alkaline Phos 38 - 126 U/L 68 61 79  AST 15 - 41 U/L 23 25 14(L)  ALT 0 - 44 U/L 34 36 15   . Lab Results  Component Value Date   LDH 217 (H) 11/20/2018      RADIOGRAPHIC STUDIES: I have personally reviewed the radiological images as listed and agreed with the findings in the report. Mr Lumbar Spine Wo Contrast  Result Date: 11/02/2018 CLINICAL DATA:  Chronic low back which became severe pain for 3 weeks in May, 2020. Right buttock, hip and leg pain. No known injury. EXAM: MRI LUMBAR SPINE WITHOUT CONTRAST TECHNIQUE: Multiplanar, multisequence MR imaging of the lumbar spine was performed. No intravenous contrast was administered. COMPARISON:  CT chest 06/13/2016. FINDINGS: Segmentation:  Standard. Alignment:  Normal. Vertebrae: No fracture or worrisome lesion. Hemangiomas are seen, scattered hemangiomas are largest in L3  and L4. Conus medullaris and cauda equina: Conus extends to the L1 level.  Conus and cauda equina appear normal. Paraspinal and other soft tissues: There is extensive retroperitoneal lymphadenopathy. Index left para-aortic node on image 19 of series 5 measures 1.5 x 2.4 cm. Right common iliac node on image 35 of series 5 measures 3.2 x 2.5 cm. Disc levels: T10-11 and T11-12 are imaged in the sagittal plane only. Disc bulging at both levels is more prominent at T11-12. No stenosis. T12-L1: Minimal bulge.  No stenosis. L1-2: Negative. L2-3: Mild facet arthropathy.  Otherwise negative. L3-4: Moderate facet arthropathy and a shallow bulge.  No stenosis. L4-5: Advanced bilateral facet degenerative disease. A synovial cyst off the medial margin of the right facet measures 1 cm craniocaudal by 0.7 cm AP by 0.6 cm transverse. Shallow disc bulge is identified. There is mild central canal narrowing and narrowing in the right subarticular recess due to the patient's synovial cyst with encroachment on the descending right L5 root. The foramina are open. L5-S1: Advanced bilateral facet degenerative disease. Otherwise negative. IMPRESSION: Extensive retroperitoneal lymphadenopathy highly worrisome for neoplastic process such as lymphoproliferative disease. Spondylosis worst at L4-5 where there is narrowing in the right subarticular recess predominantly due to a synovial cyst off the medial margin of the right facet with encroachment on the descending right L5 root. There is mild central canal narrowing overall at this level. These results will be called to the ordering clinician or representative by the Radiologist Assistant, and communication documented in the PACS or zVision Dashboard. Electronically Signed   By: Inge Rise M.D.   On: 11/02/2018 16:14    ASSESSMENT & PLAN:   #1 Retroperitoneal lymphadenopathy Concerning for possible lymphoproliferative proce   11/02/2018 MRI lumbar spine wo contrast revealed "Extensive retroperitoneal lymphadenopathy highly worrisome for neoplastic  process such as lymphoproliferative disease. Spondylosis worst at L4-5 where there is narrowing in the right subarticular recess predominantly due to a synovial cyst off the medial margin of the right facet with encroachment on the descending right L5 root. There is mild central canal narrowing overall at this Level."  PLAN: -Discussed patient's most recent labs from 10/11/2018, blood counts were normal -Discussed 11/02/2018 MRI lumbar spine wo contrast which revealed degenerative changes and incidental findings of several enlarged lymph nodes. -Discussed that MRIs are not the best scan for looking at the peritoneal space -Discussed that blood in the stool suggests an invasive intestinal infection, which could potentially provide alternative explanation for regionally enlarged lymph nodes though would needto r/o lymphoma and other etiologies for retropertoneal LNadenopathy. -Schedule PET/CT to determine whether enlarged lymph nodes are due to a reactive process or if a biopsy is necessary -Blood test today -F/U by phone in 10 days to discuss labs and PET/CT results  -labs today -PET/CT in 5 days -Phone visit with Dr Irene Limbo in 10 days   Orders Placed This Encounter  Procedures   NM PET Image Initial (PI) Skull Base To Thigh    Standing Status:   Future    Standing Expiration Date:   11/20/2019    Order Specific Question:   ** REASON FOR EXAM (FREE TEXT)    Answer:   Patient wtih extensive retroperitoneal lymphadenopathy concerning for likely lymphoma for further diagnostic evaluation and biopsy planing and staging.    Order Specific Question:   If indicated for the ordered procedure, I authorize the administration of a radiopharmaceutical per Radiology protocol    Answer:   Yes    Order  Specific Question:   Preferred imaging location?    Answer:   [facility]    Order Specific Question:   Radiology Contrast Protocol - do NOT remove file path    Answer:    [email or web address]   CBC with Differential/Platelet    Standing Status:   Future    Standing Expiration Date:   12/25/2019   CMP ([place] only)    Standing Status:   Future    Standing Expiration Date:   11/20/2019   Lactate dehydrogenase    Standing Status:   Future    Standing Expiration Date:   11/20/2019   Sedimentation rate    Standing Status:   Future    Standing Expiration Date:   11/20/2019   Hepatitis C antibody    Standing Status:   Future    Standing Expiration Date:   11/20/2019   Hepatitis B core antibody, total    Standing Status:   Future    Standing Expiration Date:   11/20/2019   Hepatitis B surface antigen    Standing Status:   Future    Standing Expiration Date:   11/20/2019   HIV Antibody (routine testing w rflx)    Standing Status:   Future    Standing Expiration Date:   11/20/2019    All of the patients questions were answered with apparent satisfaction. The patient knows to call the clinic with any problems, questions or concerns.  I spent 40 counseling the patient face to face. The total time spent in the appointment was 75 and more than 50% was on counseling and direct patient cares.  Sullivan Lone MD MS AAHIVMS [facility] Hematology/Oncology Physician [facility]  (Office):       [phone] (Work cell):  [phone] (Fax):           [phone]  11/20/2018 11:49 AM  I, De Burrs, am acting as a scribe for Dr. Irene Limbo  ..gjsc .Brunetta Genera MD

## 2018-11-20 ENCOUNTER — Telehealth: Payer: Self-pay | Admitting: Hematology

## 2018-11-20 ENCOUNTER — Inpatient Hospital Stay: Payer: Medicare Other

## 2018-11-20 ENCOUNTER — Inpatient Hospital Stay: Payer: Medicare Other | Attending: Hematology | Admitting: Hematology

## 2018-11-20 ENCOUNTER — Other Ambulatory Visit: Payer: Self-pay

## 2018-11-20 VITALS — BP 140/79 | HR 87 | Temp 98.5°F | Resp 18 | Ht 62.0 in | Wt 192.3 lb

## 2018-11-20 DIAGNOSIS — Z79899 Other long term (current) drug therapy: Secondary | ICD-10-CM

## 2018-11-20 DIAGNOSIS — K219 Gastro-esophageal reflux disease without esophagitis: Secondary | ICD-10-CM | POA: Insufficient documentation

## 2018-11-20 DIAGNOSIS — R6889 Other general symptoms and signs: Secondary | ICD-10-CM | POA: Diagnosis not present

## 2018-11-20 DIAGNOSIS — R61 Generalized hyperhidrosis: Secondary | ICD-10-CM | POA: Insufficient documentation

## 2018-11-20 DIAGNOSIS — R51 Headache: Secondary | ICD-10-CM | POA: Insufficient documentation

## 2018-11-20 DIAGNOSIS — R590 Localized enlarged lymph nodes: Secondary | ICD-10-CM

## 2018-11-20 DIAGNOSIS — Z803 Family history of malignant neoplasm of breast: Secondary | ICD-10-CM | POA: Diagnosis not present

## 2018-11-20 DIAGNOSIS — R599 Enlarged lymph nodes, unspecified: Secondary | ICD-10-CM

## 2018-11-20 DIAGNOSIS — M47816 Spondylosis without myelopathy or radiculopathy, lumbar region: Secondary | ICD-10-CM | POA: Diagnosis not present

## 2018-11-20 DIAGNOSIS — I10 Essential (primary) hypertension: Secondary | ICD-10-CM

## 2018-11-20 DIAGNOSIS — M549 Dorsalgia, unspecified: Secondary | ICD-10-CM | POA: Diagnosis not present

## 2018-11-20 LAB — CBC WITH DIFFERENTIAL/PLATELET
Abs Immature Granulocytes: 0.06 10*3/uL (ref 0.00–0.07)
Basophils Absolute: 0.1 10*3/uL (ref 0.0–0.1)
Basophils Relative: 1 %
Eosinophils Absolute: 0.1 10*3/uL (ref 0.0–0.5)
Eosinophils Relative: 1 %
HCT: 42.3 % (ref 36.0–46.0)
Hemoglobin: 14.6 g/dL (ref 12.0–15.0)
Immature Granulocytes: 1 %
Lymphocytes Relative: 35 %
Lymphs Abs: 3 10*3/uL (ref 0.7–4.0)
MCH: 29.9 pg (ref 26.0–34.0)
MCHC: 34.5 g/dL (ref 30.0–36.0)
MCV: 86.7 fL (ref 80.0–100.0)
Monocytes Absolute: 0.9 10*3/uL (ref 0.1–1.0)
Monocytes Relative: 11 %
Neutro Abs: 4.5 10*3/uL (ref 1.7–7.7)
Neutrophils Relative %: 51 %
Platelets: 187 10*3/uL (ref 150–400)
RBC: 4.88 MIL/uL (ref 3.87–5.11)
RDW: 11.6 % (ref 11.5–15.5)
WBC: 8.6 10*3/uL (ref 4.0–10.5)
nRBC: 0 % (ref 0.0–0.2)

## 2018-11-20 LAB — CMP (CANCER CENTER ONLY)
ALT: 34 U/L (ref 0–44)
AST: 23 U/L (ref 15–41)
Albumin: 4.4 g/dL (ref 3.5–5.0)
Alkaline Phosphatase: 68 U/L (ref 38–126)
Anion gap: 11 (ref 5–15)
BUN: 9 mg/dL (ref 8–23)
CO2: 25 mmol/L (ref 22–32)
Calcium: 9.9 mg/dL (ref 8.9–10.3)
Chloride: 103 mmol/L (ref 98–111)
Creatinine: 0.75 mg/dL (ref 0.44–1.00)
GFR, Est AFR Am: >60 mL/min (ref >60)
GFR, Estimated: >60 mL/min (ref >60)
Glucose, Bld: 119 mg/dL — ABNORMAL HIGH (ref 70–99)
Potassium: 3.9 mmol/L (ref 3.5–5.1)
Sodium: 139 mmol/L (ref 135–145)
Total Bilirubin: 0.4 mg/dL (ref 0.3–1.2)
Total Protein: 7.5 g/dL (ref 6.5–8.1)

## 2018-11-20 LAB — SEDIMENTATION RATE: Sed Rate: 9 mm/hr (ref 0–22)

## 2018-11-20 LAB — LACTATE DEHYDROGENASE: LDH: 217 U/L — ABNORMAL HIGH (ref 98–192)

## 2018-11-20 NOTE — Patient Instructions (Signed)
Thank you for choosing [facility] to provide your oncology and hematology care.   Should you have questions after your visit to the [facility] ([facility]), please contact this office at [phone] between 8:30 AM and 4:30 PM. Voicemails left after 4:00 PM may not be returned until the following business day. Calls received after 4:30 PM will be answered by an off-site Nurse Triage Line.    Prescription Refills:  Please have your pharmacy contact us directly for most prescription requests.  Contact the office directly for refills of narcotics (pain medications). Allow 48-72 hours for refills.  Appointments: Please contact the [facility] scheduling department [phone] for questions regarding [facility] appointment scheduling.  Contact the schedulers with any scheduling changes so that your appointment can be rescheduled in a timely manner.   Central Scheduling for [facility] [phone] - Call to schedule procedures such as PET scans, CT scans, MRI, Ultrasound, etc.  To afford each patient quality time with our providers, please arrive 30 minutes before your scheduled appointment time.  If you arrive late for your appointment, you may be asked to reschedule.  We strive to give you quality time with our providers, and arriving late affects you and other patients whose appointments are after yours. If you are a no show for multiple scheduled visits, you may be dismissed from the clinic at the providers discretion.     Resources: [facility] Social Workers [phone] for additional information on assistance programs --Anne Cunningham/Abigail Elmore  [place] DSS  [phone]: Information regarding food stamps, Medicaid, and utility assistance [employer] [phone]   [place] [employer]'s shared-ride transportation service for eligible riders who have a disability that prevents them from riding the fixed route bus.   Medicare Rights Center 800-333-4114 Helps people with  Medicare understand their rights and benefits, navigate the Medicare system, and secure the quality healthcare they deserve [employer] 800-227-2345 Assists patients locate various types of support and financial assistance Cancer Care: 1-800-813-HOPE (4673) Provides financial assistance, online support groups, medication/co-pay assistance.      

## 2018-11-20 NOTE — Telephone Encounter (Signed)
Gave avs and calendar ° °

## 2018-11-21 LAB — HEPATITIS C ANTIBODY: HCV Ab: 0.1 s/co ratio (ref 0.0–0.9)

## 2018-11-21 LAB — HIV ANTIBODY (ROUTINE TESTING W REFLEX): HIV Screen 4th Generation wRfx: NONREACTIVE

## 2018-11-21 LAB — HEPATITIS B SURFACE ANTIGEN: Hepatitis B Surface Ag: NEGATIVE

## 2018-11-21 LAB — HEPATITIS B CORE ANTIBODY, TOTAL: Hep B Core Total Ab: NEGATIVE

## 2018-11-26 ENCOUNTER — Other Ambulatory Visit: Payer: Self-pay | Admitting: Hematology

## 2018-11-26 DIAGNOSIS — R590 Localized enlarged lymph nodes: Secondary | ICD-10-CM

## 2018-11-26 DIAGNOSIS — M1990 Unspecified osteoarthritis, unspecified site: Secondary | ICD-10-CM | POA: Insufficient documentation

## 2018-11-26 DIAGNOSIS — C8590 Non-Hodgkin lymphoma, unspecified, unspecified site: Secondary | ICD-10-CM | POA: Insufficient documentation

## 2018-11-27 ENCOUNTER — Encounter (HOSPITAL_COMMUNITY): Payer: Medicare Other

## 2018-12-03 ENCOUNTER — Ambulatory Visit: Payer: Medicare Other | Admitting: Hematology

## 2018-12-03 ENCOUNTER — Encounter (HOSPITAL_COMMUNITY): Payer: Medicare Other

## 2018-12-03 ENCOUNTER — Encounter (HOSPITAL_COMMUNITY): Payer: Self-pay

## 2018-12-04 ENCOUNTER — Ambulatory Visit (HOSPITAL_COMMUNITY)
Admission: RE | Admit: 2018-12-04 | Discharge: 2018-12-04 | Disposition: A | Discharge status: Home / Self Care | Payer: Medicare Other | Source: Ambulatory Visit | Attending: Hematology | Admitting: Hematology

## 2018-12-04 ENCOUNTER — Other Ambulatory Visit: Payer: Self-pay

## 2018-12-04 DIAGNOSIS — R590 Localized enlarged lymph nodes: Secondary | ICD-10-CM | POA: Diagnosis present

## 2018-12-04 MED ORDER — SODIUM CHLORIDE (PF) 0.9 % IJ SOLN
INTRAMUSCULAR | Status: AC
  Filled 2018-12-04: qty 50

## 2018-12-04 MED ORDER — IOHEXOL 300 MG/ML  SOLN
100.0000 mL | Freq: Once | INTRAMUSCULAR | Status: AC | PRN
  Administered 2018-12-04: 100 mL via INTRAVENOUS

## 2018-12-10 NOTE — Progress Notes (Signed)
HEMATOLOGY/ONCOLOGY CONSULTATION NOTE  Date of Service: 12/10/2018  Patient Care Team: [name], MD as PCP - General (Internal Medicine)  CHIEF COMPLAINTS/PURPOSE OF CONSULTATION:  Retroperitoneal Lymphadenopathy   HISTORY OF PRESENTING ILLNESS:   Meghan Alexander is a wonderful 63 y.o. female who has been referred to [place] by Dr Layne Benton for evaluation and management of possible lymphoproliferative disease.The pt reports that she is doing well overall.  The pt reports that she had an MRI earlier this month because of her back pain. She has had back pain for about 30 years, but in May, it got so bad that she could not move. The pain radiated around her hip and down to her knee. She could not even get in her car to go to the ER. She put herself on bedrest for 5 weeks which improved her symptoms. Now, her back pain is back to baseline.  Pt reported that, in the past 6 months, she has been moving her bowels 3-4x daily, which is much more than normal for her. Throughout the day, her stools get progressively looser. She went to the ER on 10/11/2018 for food poisoning from mayonnaise because she had blood in the stool and cramping. The symptoms began 2 hours after she ate the mayo. She was not placed on antibiotics or any other medications. No stool testing was done. Her symptoms resolved in 2 days.  She experienced night sweats up until 2006, and they returned 6 months ago. She does not take hormone replacements because of her FHx of breast cancer. She also reports some new fatigue, dry eyes, and headaches over the last 6 months. Denies unexpected weight loss. She has not been diagnosed with diabetes.  Of note prior to the patient's visit today, pt has had MRI lumbar spine wo contrast completed on 11/02/2018 with results revealing "Extensive retroperitoneal lymphadenopathy highly worrisome for neoplastic process such as lymphoproliferative disease. 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 There is mild central canal narrowing overall at this Level."  Most recent lab results (10/11/2018) of CBC is as follows: all values are WNL except for Potassium at 3.3, glucose bld at 212. 10/11/2018 Urinalysis revealed hazy appearance 10/11/2018 Lipase at 29  On review of systems, pt reports back pain, 3-4 bowel movements daily, dry eyes, headaches, night sweats, and denies unexpected weight loss, mouth sores, belly pain, changes in urination trouble swallowing, fever, chills, and any other symptoms.   On PMHx the pt reports total hysterectomy at 38 years ago due to ovarian cysts, lymphoreticulosis, sleep apnea related surgery, breast augmentation with no removal of breast tissue On Social Hx the pt reports never smoking On Family Hx the pt reports breast cancer on both sides of the family.   Interval History  I connected with [name] on 12/13/2018 at  8:40 AM EDT by telephone and verified that I am speaking with the correct person using two identifiers.  I discussed the limitations, risks, security and privacy concerns of performing an evaluation and management service by telemedicine and the availability of in-person appointments. I also discussed with the patient that there may be a patient responsible charge related to this service. The patient expressed understanding and agreed to proceed.   Other persons participating in the visit and their role in the encounter:    -De Burrs, Medical Scribe  -  Yevette Edwards, Medical Scribe  Patient's location: home Provider's location: my office at the [facility] is a 63 y.o. female presenting today for the management and evaluation of Retroperitoneal Lymphadenopathy. The patient's last visit with [place] was on 11/20/2018. The pt reports  that she is doing well overall.  The pt reports that she is doing well. Pt recently had back pain and an MRI of the lumbar spine. Pt revealed that she had "cat scratch fever" many years ago.   Of note since the patient's last visit, pt has had CT CAP completed on 12/04/2018 with results revealing "Mild-to-moderate abdominal and pelvic lymphadenopathy and new 9 mm left superior mediastinal lymph node, highly suspicious for lymphoproliferative disorder. Stable 8 mm lingular pulmonary nodule, consistent with benign Etiology. Moderate hepatic steatosis. 1.3 cm indeterminate low-attenuation lesion in the right lobe. Recommend continued attention on follow-up CT."  Most recent lab results from 11/20/2018 of CBC w/diff and CMP is as follows: all values are WNL except for glucose bld at 119. 11/20/2018 Hep B Surface antigen is negative, Hep B core total antibody is negative 11/20/2018 LDH at 217 11/20/2018 sed rate at 9 11/20/2018 Hep C antibody at 0.1 11/20/2018 HIV antibody is non reactive   MEDICAL HISTORY:  Past Medical History:  Diagnosis Date   Allergy    year around allergies   Arthritis    fingers, knees, neck, back-osteoarthristis   Bronchitis    hx of   Complication of anesthesia    GERD (gastroesophageal reflux disease)    hx of reflux-went away with elevating HOB    Hypertension    Pneumonia    hx of    PONV (postoperative nausea and vomiting)     SURGICAL HISTORY: Past Surgical History:  Procedure Laterality Date   ABDOMINAL HYSTERECTOMY     ADENOIDECTOMY     cosmetic surgeries     ESOPHAGEAL MANOMETRY N/A 12/02/2015   Procedure: ESOPHAGEAL MANOMETRY (EM);  Surgeon: [name], MD;  Location: [facility];  Service: Endoscopy;  Laterality: N/A;   ESOPHAGOGASTRODUODENOSCOPY N/A 12/02/2015   Procedure: ESOPHAGOGASTRODUODENOSCOPY (EGD);  Surgeon: [name], MD;  Location: [facility];  Service: Endoscopy;  Laterality: N/A;   lymph node removal left leg      cat scratch surgery    THROAT SURGERY     sleep apnea surgery    TONSILLECTOMY      SOCIAL HISTORY: Social History   Socioeconomic History   Marital status: Married    Spouse name: Not on file   Number of children: Not on file   Years of education: Not on file   Highest education level: Not on file  Occupational History   Not on file  Social Needs   Financial resource strain: Not on file   Food insecurity    Worry: Not on file    Inability: Not on file   Transportation needs    Medical: Not on file    Non-medical: Not on file  Tobacco Use   Smoking status: Never Smoker   Smokeless tobacco: Never Used  Substance and Sexual Activity   Alcohol use: No    Alcohol/week: 0.0 standard drinks   Drug use: No   Sexual activity: Not on file  Lifestyle   Physical activity    Days per week: Not on file    Minutes per session: Not on file   Stress: Not on file  Relationships   Social connections    Talks on phone: Not  on file    Gets together: Not on file    Attends religious service: Not on file    Active member of club or organization: Not on file    Attends meetings of clubs or organizations: Not on file    Relationship status: Not on file   Intimate partner violence    Fear of current or ex partner: Not on file    Emotionally abused: Not on file    Physically abused: Not on file    Forced sexual activity: Not on file  Other Topics Concern   Not on file  Social History Narrative   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 [employer]   Never smoker never drinker   No drugs   Multiple allergies   Lives at home with her husband of 8 years since 2008    FAMILY HISTORY: Family History  Problem Relation Age of Onset   Lung cancer Father        1982 died from it   Hernia Mother    Breast cancer Maternal Aunt        multiple    Breast cancer Maternal Grandmother    Breast cancer Paternal Grandmother    Breast  cancer Cousin        cousins multiple    Colon cancer Neg Hx    Colon polyps Neg Hx    Rectal cancer Neg Hx    Stomach cancer Neg Hx     ALLERGIES:  is allergic to codeine; hydrocodone; latex; and oxycodone.  MEDICATIONS:  Current Outpatient Medications  Medication Sig Dispense Refill   Biotin 7500 MCG TABS Take 1 tablet by mouth daily.     diphenhydrAMINE (BENADRYL) 25 MG tablet Take 25 mg by mouth at bedtime.     Lactobacillus-Inulin (CULTURELLE DIGESTIVE HEALTH PO) Take 1 capsule by mouth at bedtime.     lisinopril-hydrochlorothiazide (PRINZIDE,ZESTORETIC) 10-12.5 MG per tablet Take 1 tablet by mouth daily.      Melatonin 10 MG CAPS Take 10 mg by mouth at bedtime.      metoprolol succinate (TOPROL-XL) 50 MG 24 hr tablet Take 50 mg by mouth daily.     Multiple Vitamin (MULTIVITAMIN) tablet Take 1 tablet by mouth daily.     rosuvastatin (CRESTOR) 10 MG tablet Take 10 mg by mouth daily.     No current facility-administered medications for this visit.     REVIEW OF SYSTEMS:    A 10+ POINT REVIEW OF SYSTEMS WAS OBTAINED including neurology, dermatology, psychiatry, cardiac, respiratory, lymph, extremities, GI, GU, Musculoskeletal, constitutional, breasts, reproductive, HEENT.  All pertinent positives are noted in the HPI.  All others are negative.   PHYSICAL EXAMINATION: ECOG PERFORMANCE STATUS: 1 - Symptomatic but completely ambulatory  . There were no vitals filed for this visit. There were no vitals filed for this visit. .There is no height or weight on file to calculate BMI.  Phone Visit  LABORATORY DATA:  I have reviewed the data as listed  . CBC Latest Ref Rng & Units 11/20/2018 10/11/2018 10/19/2014  WBC 4.0 - 10.5 K/uL 8.6 10.3 16.6(H)  Hemoglobin 12.0 - 15.0 g/dL 14.6 14.9 11.6(L)  Hematocrit 36.0 - 46.0 % 42.3 44.3 35.1(L)  Platelets 150 - 400 K/uL 187 185 326    . CMP Latest Ref Rng & Units 11/20/2018 10/11/2018 10/18/2014  Glucose 70 - 99 mg/dL  119(H) 212(H) 241(H)  BUN 8 - 23 mg/dL 9 13 10   Creatinine 0.44 - 1.00 mg/dL  0.75 0.70 0.67  Sodium 135 - 145 mmol/L 139 139 136  Potassium 3.5 - 5.1 mmol/L 3.9 3.3(L) 3.8  Chloride 98 - 111 mmol/L 103 103 97(L)  CO2 22 - 32 mmol/L 25 25 28   Calcium 8.9 - 10.3 mg/dL 9.9 9.5 8.8(L)  Total Protein 6.5 - 8.1 g/dL 7.5 6.9 6.2(L)  Total Bilirubin 0.3 - 1.2 mg/dL 0.4 0.7 0.5  Alkaline Phos 38 - 126 U/L 68 61 79  AST 15 - 41 U/L 23 25 14(L)  ALT 0 - 44 U/L 34 36 15   . Lab Results  Component Value Date   LDH 217 (H) 11/20/2018      RADIOGRAPHIC STUDIES: I have personally reviewed the radiological images as listed and agreed with the findings in the report. Ct Chest W Contrast  Result Date: 12/04/2018 CLINICAL DATA:  Retroperitoneal lymphadenopathy on recent lumbar spine MRI. Back pain. Possible lymphoma. EXAM: CT CHEST, ABDOMEN, AND PELVIS WITH CONTRAST TECHNIQUE: Multidetector CT imaging of the chest, abdomen and pelvis was performed following the standard protocol during bolus administration of intravenous contrast. CONTRAST:  155mL OMNIPAQUE IOHEXOL 300 MG/ML  SOLN COMPARISON:  Chest CT on 06/13/2016; no prior AP CT FINDINGS: CT CHEST FINDINGS Cardiovascular: No acute findings. Mediastinum/Lymph Nodes: New 9 mm mediastinal lymph node in left paratracheal region on image 13/2. shotty sub-cm bilateral axillary lymph nodes are unchanged. Diffuse esophageal dilatation again demonstrated but mildly decreased since previous study. No evidence of hiatal hernia. Lungs/Pleura: Stable 8 mm pulmonary nodule in the posterior lingula. Stable mild lingular scarring. No other suspicious nodules or masses identified. No evidence of pulmonary infiltrate or pleural effusion. Musculoskeletal:  No suspicious bone lesions identified. CT ABDOMEN AND PELVIS FINDINGS Hepatobiliary: Moderate diffuse hepatic steatosis. 1.3 cm nonspecific low-attenuation lesion in the inferior right hepatic lobe on image 64/2.  Gallbladder is unremarkable. No evidence of biliary ductal dilatation. Pancreas:  No mass or inflammatory changes. Spleen:  Within normal limits in size and appearance. Adrenals/Urinary tract:  No masses or hydronephrosis. Stomach/Bowel: No evidence of obstruction, inflammatory process, or abnormal fluid collections. Vascular/Lymphatic: Mild-to-moderate lymphadenopathy is seen throughout the abdominal retroperitoneum index lymph node in left paraaortic region measures 1.6 cm on image 78/2. Bilateral common iliac lymphadenopathy is also seen, with largest lymph node on the right measuring 2.8 cm on image 89/2. Aortic atherosclerosis. No abdominal aortic aneurysm. Reproductive: Prior hysterectomy noted. Adnexal regions are unremarkable in appearance. Other:  None. Musculoskeletal:  No suspicious bone lesions identified. IMPRESSION: Mild-to-moderate abdominal and pelvic lymphadenopathy and new 9 mm left superior mediastinal lymph node, highly suspicious for lymphoproliferative disorder. Stable 8 mm lingular pulmonary nodule, consistent with benign etiology. Moderate hepatic steatosis. 1.3 cm indeterminate low-attenuation lesion in the right lobe. Recommend continued attention on follow-up CT. Electronically Signed   By: Marlaine Hind M.D.   On: 12/04/2018 14:11   Ct Abdomen Pelvis W Contrast  Result Date: 12/04/2018 CLINICAL DATA:  Retroperitoneal lymphadenopathy on recent lumbar spine MRI. Back pain. Possible lymphoma. EXAM: CT CHEST, ABDOMEN, AND PELVIS WITH CONTRAST TECHNIQUE: Multidetector CT imaging of the chest, abdomen and pelvis was performed following the standard protocol during bolus administration of intravenous contrast. CONTRAST:  14mL OMNIPAQUE IOHEXOL 300 MG/ML  SOLN COMPARISON:  Chest CT on 06/13/2016; no prior AP CT FINDINGS: CT CHEST FINDINGS Cardiovascular: No acute findings. Mediastinum/Lymph Nodes: New 9 mm mediastinal lymph node in left paratracheal region on image 13/2. shotty sub-cm  bilateral axillary lymph nodes are unchanged. Diffuse esophageal dilatation again demonstrated but mildly  decreased since previous study. No evidence of hiatal hernia. Lungs/Pleura: Stable 8 mm pulmonary nodule in the posterior lingula. Stable mild lingular scarring. No other suspicious nodules or masses identified. No evidence of pulmonary infiltrate or pleural effusion. Musculoskeletal:  No suspicious bone lesions identified. CT ABDOMEN AND PELVIS FINDINGS Hepatobiliary: Moderate diffuse hepatic steatosis. 1.3 cm nonspecific low-attenuation lesion in the inferior right hepatic lobe on image 64/2. Gallbladder is unremarkable. No evidence of biliary ductal dilatation. Pancreas:  No mass or inflammatory changes. Spleen:  Within normal limits in size and appearance. Adrenals/Urinary tract:  No masses or hydronephrosis. Stomach/Bowel: No evidence of obstruction, inflammatory process, or abnormal fluid collections. Vascular/Lymphatic: Mild-to-moderate lymphadenopathy is seen throughout the abdominal retroperitoneum index lymph node in left paraaortic region measures 1.6 cm on image 78/2. Bilateral common iliac lymphadenopathy is also seen, with largest lymph node on the right measuring 2.8 cm on image 89/2. Aortic atherosclerosis. No abdominal aortic aneurysm. Reproductive: Prior hysterectomy noted. Adnexal regions are unremarkable in appearance. Other:  None. Musculoskeletal:  No suspicious bone lesions identified. IMPRESSION: Mild-to-moderate abdominal and pelvic lymphadenopathy and new 9 mm left superior mediastinal lymph node, highly suspicious for lymphoproliferative disorder. Stable 8 mm lingular pulmonary nodule, consistent with benign etiology. Moderate hepatic steatosis. 1.3 cm indeterminate low-attenuation lesion in the right lobe. Recommend continued attention on follow-up CT. Electronically Signed   By: Marlaine Hind M.D.   On: 12/04/2018 14:11    ASSESSMENT & PLAN:   #1 Retroperitoneal  lymphadenopathy Concerning for possible lymphoproliferative proce   11/02/2018 MRI lumbar spine wo contrast revealed "Extensive retroperitoneal lymphadenopathy highly worrisome for neoplastic process such as lymphoproliferative disease. Spondylosis worst at L4-5 where there is narrowing in the right subarticular recess predominantly due to a synovial cyst off the medial margin of the right facet with encroachment on the descending right L5 root. There is mild central canal narrowing overall at this Level."  12/04/2018 CT CAP revealed "Mild-to-moderate abdominal and pelvic lymphadenopathy and new 9 mm left superior mediastinal lymph node, highly suspicious for lymphoproliferative disorder. Stable 8 mm lingular pulmonary nodule, consistent with benign Etiology. Moderate hepatic steatosis. 1.3 cm indeterminate low-attenuation lesion in the right lobe. Recommend continued attention on follow-up CT."  PLAN: A: -Discussed most recent lab work from 11/20/2018; normal at this time, liver and kidney blood work also look good at his time. LDH levels were acceptable, only borderline elevated. Sed rate was normal. HIV, Hep B and Hep C test were all negative.  -Discussed 12/04/2018 CT CAP which revealed lymphadenopathy in chest, axilla and retroperitoneal region without a clear, primary, solid tumor. Low attenuation lesion found in the liver, which shall be monitored at this time with subsequent CT scans. Does not appear related to the enlarged lymph nodes. -Discussed that a PET scan will give more information and may need to be done in the future.  -Discussed it could be a low-grade lymphoma or inflammatory process. If low-grade lymphoma it could mean just keeping an eye on it. Not likely the source of her back pain. -Schedule core needle lymph node biopsy in 1 week.    -Can set up an appointment to discuss biopsy results 3-4 after results come back.   CT biopsy of LN ASAP RTC with Dr Irene Limbo in 4 days after CT  guided biopsy   Orders Placed This Encounter  Procedures   CT Biopsy    Standing Status:   Future    Number of Occurrences:   1    Standing Expiration Date:  12/13/2019    Order Specific Question:   Lab orders requested (DO NOT place separate lab orders, these will be automatically ordered during procedure specimen collection):    Answer:   Surgical Pathology    Comments:   flow cytometry, FISH studies as needed    Order Specific Question:   Reason for Exam (SYMPTOM  OR DIAGNOSIS REQUIRED)    Answer:   CT guided core needle biopsy of common iliac or retroperitoneal LN. Concern for lymphoma.    Order Specific Question:   Preferred location?    Answer:   [facility]    Order Specific Question:   Radiology Contrast Protocol - do NOT remove file path    Answer:   [email or web address]    All of the patients questions were answered with apparent satisfaction. The patient knows to call the clinic with any problems, questions or concerns.  The total time spent in the appt was 15 minutes and more than 50% was on counseling and direct patient cares.  Sullivan Lone MD MS AAHIVMS [facility] Hematology/Oncology Physician [facility]  (Office):       [phone] (Work cell):  [phone] (Fax):           [phone]  12/10/2018 9:05 AM  I, De Burrs, am acting as a scribe for Dr. Irene Limbo  .I have reviewed the above documentation for accuracy and completeness, and I agree with the above. Brunetta Genera MD

## 2018-12-13 ENCOUNTER — Telehealth: Payer: Self-pay | Admitting: Hematology

## 2018-12-13 ENCOUNTER — Inpatient Hospital Stay: Payer: Medicare Other | Attending: Hematology | Admitting: Hematology

## 2018-12-13 DIAGNOSIS — Z79899 Other long term (current) drug therapy: Secondary | ICD-10-CM | POA: Insufficient documentation

## 2018-12-13 DIAGNOSIS — Z803 Family history of malignant neoplasm of breast: Secondary | ICD-10-CM | POA: Diagnosis not present

## 2018-12-13 DIAGNOSIS — M25569 Pain in unspecified knee: Secondary | ICD-10-CM | POA: Insufficient documentation

## 2018-12-13 DIAGNOSIS — M549 Dorsalgia, unspecified: Secondary | ICD-10-CM | POA: Insufficient documentation

## 2018-12-13 DIAGNOSIS — R591 Generalized enlarged lymph nodes: Secondary | ICD-10-CM

## 2018-12-13 DIAGNOSIS — M25559 Pain in unspecified hip: Secondary | ICD-10-CM | POA: Insufficient documentation

## 2018-12-13 DIAGNOSIS — R590 Localized enlarged lymph nodes: Secondary | ICD-10-CM | POA: Insufficient documentation

## 2018-12-13 DIAGNOSIS — M47816 Spondylosis without myelopathy or radiculopathy, lumbar region: Secondary | ICD-10-CM | POA: Insufficient documentation

## 2018-12-13 DIAGNOSIS — R5383 Other fatigue: Secondary | ICD-10-CM | POA: Insufficient documentation

## 2018-12-13 DIAGNOSIS — R911 Solitary pulmonary nodule: Secondary | ICD-10-CM | POA: Insufficient documentation

## 2018-12-13 DIAGNOSIS — Z885 Allergy status to narcotic agent status: Secondary | ICD-10-CM | POA: Insufficient documentation

## 2018-12-13 DIAGNOSIS — K921 Melena: Secondary | ICD-10-CM | POA: Insufficient documentation

## 2018-12-13 DIAGNOSIS — N83209 Unspecified ovarian cyst, unspecified side: Secondary | ICD-10-CM | POA: Diagnosis not present

## 2018-12-13 DIAGNOSIS — K760 Fatty (change of) liver, not elsewhere classified: Secondary | ICD-10-CM | POA: Diagnosis not present

## 2018-12-13 DIAGNOSIS — R61 Generalized hyperhidrosis: Secondary | ICD-10-CM | POA: Insufficient documentation

## 2018-12-13 DIAGNOSIS — Z90710 Acquired absence of both cervix and uterus: Secondary | ICD-10-CM | POA: Diagnosis not present

## 2018-12-13 DIAGNOSIS — R51 Headache: Secondary | ICD-10-CM | POA: Diagnosis not present

## 2018-12-13 DIAGNOSIS — Z801 Family history of malignant neoplasm of trachea, bronchus and lung: Secondary | ICD-10-CM | POA: Diagnosis not present

## 2018-12-13 DIAGNOSIS — R6889 Other general symptoms and signs: Secondary | ICD-10-CM | POA: Diagnosis not present

## 2018-12-13 NOTE — Telephone Encounter (Signed)
Scheduled appt per 8/20 los. ° °Spoke with patient and she is aware of her appt date and time. °

## 2018-12-17 ENCOUNTER — Other Ambulatory Visit: Payer: Self-pay | Admitting: Physician Assistant

## 2018-12-18 ENCOUNTER — Other Ambulatory Visit: Payer: Self-pay

## 2018-12-18 ENCOUNTER — Ambulatory Visit (HOSPITAL_COMMUNITY)
Admission: RE | Admit: 2018-12-18 | Discharge: 2018-12-18 | Disposition: A | Discharge status: Home / Self Care | Payer: Medicare Other | Source: Ambulatory Visit | Attending: Hematology | Admitting: Hematology

## 2018-12-18 ENCOUNTER — Encounter (HOSPITAL_COMMUNITY): Payer: Self-pay

## 2018-12-18 DIAGNOSIS — R590 Localized enlarged lymph nodes: Secondary | ICD-10-CM | POA: Insufficient documentation

## 2018-12-18 DIAGNOSIS — I10 Essential (primary) hypertension: Secondary | ICD-10-CM | POA: Diagnosis not present

## 2018-12-18 DIAGNOSIS — M1990 Unspecified osteoarthritis, unspecified site: Secondary | ICD-10-CM | POA: Diagnosis not present

## 2018-12-18 DIAGNOSIS — M549 Dorsalgia, unspecified: Secondary | ICD-10-CM | POA: Insufficient documentation

## 2018-12-18 DIAGNOSIS — R591 Generalized enlarged lymph nodes: Secondary | ICD-10-CM

## 2018-12-18 DIAGNOSIS — Z885 Allergy status to narcotic agent status: Secondary | ICD-10-CM | POA: Insufficient documentation

## 2018-12-18 DIAGNOSIS — Z79899 Other long term (current) drug therapy: Secondary | ICD-10-CM | POA: Diagnosis not present

## 2018-12-18 LAB — CBC
HCT: 41.6 % (ref 36.0–46.0)
Hemoglobin: 14.4 g/dL (ref 12.0–15.0)
MCH: 30.8 pg (ref 26.0–34.0)
MCHC: 34.6 g/dL (ref 30.0–36.0)
MCV: 88.9 fL (ref 80.0–100.0)
Platelets: 200 10*3/uL (ref 150–400)
RBC: 4.68 MIL/uL (ref 3.87–5.11)
RDW: 11.8 % (ref 11.5–15.5)
WBC: 8.5 10*3/uL (ref 4.0–10.5)
nRBC: 0 % (ref 0.0–0.2)

## 2018-12-18 LAB — PROTIME-INR
INR: 1.1 (ref 0.8–1.2)
Prothrombin Time: 14.1 seconds (ref 11.4–15.2)

## 2018-12-18 MED ORDER — FENTANYL CITRATE (PF) 100 MCG/2ML IJ SOLN
INTRAMUSCULAR | Status: AC
  Filled 2018-12-18: qty 2

## 2018-12-18 MED ORDER — MIDAZOLAM HCL 2 MG/2ML IJ SOLN
INTRAMUSCULAR | Status: AC
  Filled 2018-12-18: qty 2

## 2018-12-18 MED ORDER — FENTANYL CITRATE (PF) 100 MCG/2ML IJ SOLN
INTRAMUSCULAR | Status: AC | PRN
  Administered 2018-12-18: 50 ug via INTRAVENOUS
  Administered 2018-12-18: 25 ug via INTRAVENOUS

## 2018-12-18 MED ORDER — LIDOCAINE HCL 1 % IJ SOLN
INTRAMUSCULAR | Status: AC
  Filled 2018-12-18: qty 20

## 2018-12-18 MED ORDER — MIDAZOLAM HCL 2 MG/2ML IJ SOLN
INTRAMUSCULAR | Status: AC | PRN
  Administered 2018-12-18: 0.5 mg via INTRAVENOUS
  Administered 2018-12-18: 1 mg via INTRAVENOUS

## 2018-12-18 MED ORDER — SODIUM CHLORIDE 0.9 % IV SOLN: INTRAVENOUS | Status: DC

## 2018-12-18 MED ORDER — SODIUM CHLORIDE 0.9 % IV SOLN
INTRAVENOUS | Status: AC | PRN
  Administered 2018-12-18: 10 mL/h via INTRAVENOUS

## 2018-12-18 NOTE — H&P (Signed)
Chief Complaint: Retroperitoneal  lympadenopathy  Referring Physician(s): Brunetta Genera  Supervising Physician: Arne Cleveland  Patient Status: [facility] - Out-pt  History of Present Illness: Meghan Alexander is a 63 y.o. female had an MRI on 11/02/2018 because of back pain.  MRI showed extensive retroperitoneal lymphadenopathy highly worrisome for neoplastic process such as lymphoproliferative disease.  We are asked to perform an image guided biopsy.  She is NPO. No blood thinners. No nausea/vomiting. No Fever/chills. ROS negative.   Past Medical History:  Diagnosis Date   Allergy    year around allergies   Arthritis    fingers, knees, neck, back-osteoarthristis   Bronchitis    hx of   Complication of anesthesia    GERD (gastroesophageal reflux disease)    hx of reflux-went away with elevating HOB    Hypertension    Pneumonia    hx of    PONV (postoperative nausea and vomiting)     Past Surgical History:  Procedure Laterality Date   ABDOMINAL HYSTERECTOMY     ADENOIDECTOMY     cosmetic surgeries     ESOPHAGEAL MANOMETRY N/A 12/02/2015   Procedure: ESOPHAGEAL MANOMETRY (EM);  Surgeon: [name], MD;  Location: [facility];  Service: Endoscopy;  Laterality: N/A;   ESOPHAGOGASTRODUODENOSCOPY N/A 12/02/2015   Procedure: ESOPHAGOGASTRODUODENOSCOPY (EGD);  Surgeon: [name], MD;  Location: [facility];  Service: Endoscopy;  Laterality: N/A;   lymph node removal left leg     cat scratch surgery    THROAT SURGERY     sleep apnea surgery    TONSILLECTOMY      Allergies: Codeine, Other, Hydrocodone, Latex, and Oxycodone  Medications: Prior to Admission medications   Medication Sig Start Date End Date Taking? Authorizing Provider  Biotin 7500 MCG TABS Take 1 tablet by mouth daily.   Yes [provider]  diphenhydrAMINE (BENADRYL) 25 MG tablet Take 25 mg by mouth at bedtime.   Yes [provider]  Lactobacillus-Inulin  (CULTURELLE DIGESTIVE HEALTH PO) Take 1 capsule by mouth at bedtime.   Yes [provider]  lisinopril-hydrochlorothiazide (PRINZIDE,ZESTORETIC) 10-12.5 MG per tablet Take 1 tablet by mouth daily.  06/17/13  Yes [provider]  Melatonin 10 MG CAPS Take 10 mg by mouth at bedtime.    Yes [provider]  metoprolol succinate (TOPROL-XL) 50 MG 24 hr tablet Take 50 mg by mouth daily. 12/21/15  Yes [provider]  Multiple Vitamin (MULTIVITAMIN) tablet Take 1 tablet by mouth daily.   Yes [provider]  rosuvastatin (CRESTOR) 10 MG tablet Take 10 mg by mouth daily.   Yes [provider]     Family History  Problem Relation Age of Onset   Lung cancer Father        1980-07-31 died from it   Hernia Mother    Breast cancer Maternal Aunt        multiple    Breast cancer Maternal Grandmother    Breast cancer Paternal Grandmother    Breast cancer Cousin        cousins multiple    Colon cancer Neg Hx    Colon polyps Neg Hx    Rectal cancer Neg Hx    Stomach cancer Neg Hx     Social History   Socioeconomic History   Marital status: Married    Spouse name: Not on file   Number of children: Not on file   Years of education: Not on file   Highest education level: Not  on file  Occupational History   Not on file  Social Needs   Financial resource strain: Not on file   Food insecurity    Worry: Not on file    Inability: Not on file   Transportation needs    Medical: Not on file    Non-medical: Not on file  Tobacco Use   Smoking status: Never Smoker   Smokeless tobacco: Never Used  Substance and Sexual Activity   Alcohol use: No    Alcohol/week: 0.0 standard drinks   Drug use: No   Sexual activity: Not on file  Lifestyle   Physical activity    Days per week: Not on file    Minutes per session: Not on file   Stress: Not on file  Relationships   Social connections    Talks on phone: Not on file    Gets  together: Not on file    Attends religious service: Not on file    Active member of club or organization: Not on file    Attends meetings of clubs or organizations: Not on file    Relationship status: Not on file  Other Topics Concern   Not on file  Social History Narrative   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  All other systems are negative.  Review of Systems  Vital Signs: BP 140/86    Pulse 71    Temp 97.9 F (36.6 C) (Oral)    Resp 16    Ht 5\' 2"  (1.575 m)    Wt 86.2 kg    SpO2 99%    BMI 34.75 kg/m   Physical Exam Vitals signs reviewed.  Constitutional:      Appearance: Normal appearance.  HENT:     Head: Normocephalic and atraumatic.  Eyes:     Extraocular Movements: Extraocular movements intact.  Neck:     Musculoskeletal: Normal range of motion.  Cardiovascular:     Rate and Rhythm: Normal rate and regular rhythm.  Pulmonary:     Effort: Pulmonary effort is normal.     Breath sounds: Normal breath sounds.  Abdominal:     Palpations: Abdomen is soft.     Tenderness: There is no abdominal tenderness.  Musculoskeletal: Normal range of motion.  Skin:    General: Skin is warm and dry.  Neurological:     General: No focal deficit present.     Mental Status: She is alert and oriented to person, place, and time.  Psychiatric:        Mood and Affect: Mood normal.        Behavior: Behavior normal.        Thought Content: Thought content normal.        Judgment: Judgment normal.     Imaging: Ct Chest W Contrast  Result Date: 12/04/2018 CLINICAL DATA:  Retroperitoneal lymphadenopathy on recent lumbar spine MRI. Back pain. Possible lymphoma. EXAM: CT CHEST, ABDOMEN, AND PELVIS WITH CONTRAST TECHNIQUE:  Multidetector CT imaging of the chest, abdomen and pelvis was performed following the standard protocol during bolus administration of intravenous contrast. CONTRAST:  164mL OMNIPAQUE IOHEXOL 300 MG/ML  SOLN COMPARISON:  Chest CT on 06/13/2016; no prior AP CT FINDINGS:  CT CHEST FINDINGS Cardiovascular: No acute findings. Mediastinum/Lymph Nodes: New 9 mm mediastinal lymph node in left paratracheal region on image 13/2. shotty sub-cm bilateral axillary lymph nodes are unchanged. Diffuse esophageal dilatation again demonstrated but mildly decreased since previous study. No evidence of hiatal hernia. Lungs/Pleura: Stable 8 mm pulmonary nodule in the posterior lingula. Stable mild lingular scarring. No other suspicious nodules or masses identified. No evidence of pulmonary infiltrate or pleural effusion. Musculoskeletal:  No suspicious bone lesions identified. CT ABDOMEN AND PELVIS FINDINGS Hepatobiliary: Moderate diffuse hepatic steatosis. 1.3 cm nonspecific low-attenuation lesion in the inferior right hepatic lobe on image 64/2. Gallbladder is unremarkable. No evidence of biliary ductal dilatation. Pancreas:  No mass or inflammatory changes. Spleen:  Within normal limits in size and appearance. Adrenals/Urinary tract:  No masses or hydronephrosis. Stomach/Bowel: No evidence of obstruction, inflammatory process, or abnormal fluid collections. Vascular/Lymphatic: Mild-to-moderate lymphadenopathy is seen throughout the abdominal retroperitoneum index lymph node in left paraaortic region measures 1.6 cm on image 78/2. Bilateral common iliac lymphadenopathy is also seen, with largest lymph node on the right measuring 2.8 cm on image 89/2. Aortic atherosclerosis. No abdominal aortic aneurysm. Reproductive: Prior hysterectomy noted. Adnexal regions are unremarkable in appearance. Other:  None. Musculoskeletal:  No suspicious bone lesions identified. IMPRESSION: Mild-to-moderate abdominal and pelvic lymphadenopathy and new 9  mm left superior mediastinal lymph node, highly suspicious for lymphoproliferative disorder. Stable 8 mm lingular pulmonary nodule, consistent with benign etiology. Moderate hepatic steatosis. 1.3 cm indeterminate low-attenuation lesion in the right lobe. Recommend continued attention on follow-up CT. Electronically Signed   By: Marlaine Hind M.D.   On: 12/04/2018 14:11   Ct Abdomen Pelvis W Contrast  Result Date: 12/04/2018 CLINICAL DATA:  Retroperitoneal lymphadenopathy on recent lumbar spine MRI. Back pain. Possible lymphoma. EXAM: CT CHEST, ABDOMEN, AND PELVIS WITH CONTRAST TECHNIQUE: Multidetector CT imaging of the chest, abdomen and pelvis was performed following the standard protocol during bolus administration of intravenous contrast. CONTRAST:  19mL OMNIPAQUE IOHEXOL 300 MG/ML  SOLN COMPARISON:  Chest CT on 06/13/2016; no prior AP CT FINDINGS: CT CHEST FINDINGS Cardiovascular: No acute findings. Mediastinum/Lymph Nodes: New 9 mm mediastinal lymph node in left paratracheal region on image 13/2. shotty sub-cm bilateral axillary lymph nodes are unchanged. Diffuse esophageal dilatation again demonstrated but mildly decreased since previous study. No evidence of hiatal hernia. Lungs/Pleura: Stable 8 mm pulmonary nodule in the posterior lingula. Stable mild lingular scarring. No other suspicious nodules or masses identified. No evidence of pulmonary infiltrate or pleural effusion. Musculoskeletal:  No suspicious bone lesions identified. CT ABDOMEN AND PELVIS FINDINGS Hepatobiliary: Moderate diffuse hepatic steatosis. 1.3 cm nonspecific low-attenuation lesion in the inferior right hepatic lobe on image 64/2. Gallbladder is unremarkable. No evidence of biliary ductal dilatation. Pancreas:  No mass or inflammatory changes. Spleen:  Within normal limits in size and appearance. Adrenals/Urinary tract:  No masses or hydronephrosis. Stomach/Bowel: No evidence of obstruction, inflammatory process, or abnormal fluid  collections. Vascular/Lymphatic: Mild-to-moderate lymphadenopathy is seen throughout the abdominal retroperitoneum index lymph node in left paraaortic region measures 1.6 cm on image 78/2. Bilateral common iliac lymphadenopathy is also seen, with largest lymph node on the right measuring 2.8 cm on image 89/2. Aortic atherosclerosis. No abdominal aortic aneurysm. Reproductive: Prior hysterectomy noted. Adnexal regions are unremarkable in appearance. Other:  None. Musculoskeletal:  No suspicious bone lesions identified. IMPRESSION: Mild-to-moderate abdominal and pelvic lymphadenopathy and new 9 mm left superior mediastinal lymph node, highly suspicious for lymphoproliferative disorder. Stable 8 mm lingular pulmonary nodule, consistent  with benign etiology. Moderate hepatic steatosis. 1.3 cm indeterminate low-attenuation lesion in the right lobe. Recommend continued attention on follow-up CT. Electronically Signed   By: Marlaine Hind M.D.   On: 12/04/2018 14:11    Labs:  CBC: Recent Labs    10/11/18 0936 11/20/18 1204 12/18/18 0630  WBC 10.3 8.6 8.5  HGB 14.9 14.6 14.4  HCT 44.3 42.3 41.6  PLT 185 187 200    COAGS: Recent Labs    12/18/18 0630  INR 1.1    BMP: Recent Labs    10/11/18 0936 11/20/18 1204  NA 139 139  K 3.3* 3.9  CL 103 103  CO2 25 25  GLUCOSE 212* 119*  BUN 13 9  CALCIUM 9.5 9.9  CREATININE 0.70 0.75  GFRNONAA >60 >60  GFRAA >60 >60    LIVER FUNCTION TESTS: Recent Labs    10/11/18 0936 11/20/18 1204  BILITOT 0.7 0.4  AST 25 23  ALT 36 34  ALKPHOS 61 68  PROT 6.9 7.5  ALBUMIN 4.4 4.4    TUMOR MARKERS: No results for input(s): AFPTM, CEA, CA199, CHROMGRNA in the last 8760 hours.  Assessment and Plan:  Extensive retroperitoneal lymphadenopathy.  Will proceed with image guided biopsy today by Dr. Vernard Gambles.  Risks and benefits of lymph node biopsy was discussed with the patient and/or patient's family including, but not limited to bleeding,  infection, damage to adjacent structures or low yield requiring additional tests.  All of the questions were answered and there is agreement to proceed.  Consent signed and in chart.  Thank you for this interesting consult.  I greatly enjoyed meeting Meghan Alexander and look forward to participating in their care.  A copy of this report was sent to the requesting provider on this date.  Electronically Signed: Murrell Redden, PA-C   12/18/2018, 7:16 AM      I spent a total of 40 Minutes in face to face in clinical consultation, greater than 50% of which was counseling/coordinating care for retroperitoneal LN biopsy.

## 2018-12-18 NOTE — Procedures (Signed)
  Procedure: CT core biopsy L paraaortic LAN   EBL:   minimal Complications:  none immediate  See full dictation in [facility] PACS.  D. Keyosha Tiedt MD Main # [phone] Pager  [phone]   

## 2018-12-18 NOTE — Sedation Documentation (Signed)
Called to give report. Nurse unavailable. Will call back 

## 2018-12-18 NOTE — Discharge Instructions (Signed)
Needle Biopsy, Care After °This sheet gives you information about how to care for yourself after your procedure. Your health care provider may also give you more specific instructions. If you have problems or questions, contact your health care provider. °What can I expect after the procedure? °After the procedure, it is common to have soreness, bruising, or mild pain at the puncture site. This should go away in a few days. °Follow these instructions at home: °Needle insertion site care ° °· Wash your hands with soap and water before you change your bandage (dressing). If you cannot use soap and water, use hand sanitizer. °· Follow instructions from your health care provider about how to take care of your puncture site. This includes: °? When and how to change your dressing. °? When to remove your dressing. °· Check your puncture site every day for signs of infection. Check for: °? Redness, swelling, or pain. °? Fluid or blood. °? Pus or a bad smell. °? Warmth. °General instructions °· Return to your normal activities as told by your health care provider. Ask your health care provider what activities are safe for you. °· Do not take baths, swim, or use a hot tub until your health care provider approves. Ask your health care provider if you may take showers. You may only be allowed to take sponge baths. °· Take over-the-counter and prescription medicines only as told by your health care provider. °· Keep all follow-up visits as told by your health care provider. This is important. °Contact a health care provider if: °· You have a fever. °· You have redness, swelling, or pain at the puncture site that lasts longer than a few days. °· You have fluid, blood, or pus coming from your puncture site. °· Your puncture site feels warm to the touch. °Get help right away if: °· You have severe bleeding from the puncture site. °Summary °· After the procedure, it is common to have soreness, bruising, or mild pain at the puncture  site. This should go away in a few days. °· Check your puncture site every day for signs of infection, such as redness, swelling, or pain. °· Get help right away if you have severe bleeding from your puncture site. °This information is not intended to replace advice given to you by your health care provider. Make sure you discuss any questions you have with your health care provider. °Document Released: 08/26/2014 Document Revised: 06/23/2017 Document Reviewed: 04/24/2017 °Elsevier Patient Education © 2020 [employer] °Moderate Conscious Sedation, Adult, Care After °These instructions provide you with information about caring for yourself after your procedure. Your health care provider may also give you more specific instructions. Your treatment has been planned according to current medical practices, but problems sometimes occur. Call your health care provider if you have any problems or questions after your procedure. °What can I expect after the procedure? °After your procedure, it is common: °· To feel sleepy for several hours. °· To feel clumsy and have poor balance for several hours. °· To have poor judgment for several hours. °· To vomit if you eat too soon. °Follow these instructions at home: °For at least 24 hours after the procedure: ° °· Do not: °? Participate in activities where you could fall or become injured. °? Drive. °? Use heavy machinery. °? Drink alcohol. °? Take sleeping pills or medicines that cause drowsiness. °? Make important decisions or sign legal documents. °? Take care of children on your own. °· Rest. °Eating and   drinking °· Follow the diet recommended by your health care provider. °· If you vomit: °? Drink water, juice, or soup when you can drink without vomiting. °? Make sure you have little or no nausea before eating solid foods. °General instructions °· Have a responsible adult stay with you until you are awake and alert. °· Take over-the-counter and prescription medicines only as  told by your health care provider. °· If you smoke, do not smoke without supervision. °· Keep all follow-up visits as told by your health care provider. This is important. °Contact a health care provider if: °· You keep feeling nauseous or you keep vomiting. °· You feel light-headed. °· You develop a rash. °· You have a fever. °Get help right away if: °· You have trouble breathing. °This information is not intended to replace advice given to you by your health care provider. Make sure you discuss any questions you have with your health care provider. °Document Released: 01/30/2013 Document Revised: 03/24/2017 Document Reviewed: 08/01/2015 °Elsevier Patient Education © 2020 [employer] ° °

## 2018-12-20 NOTE — Progress Notes (Signed)
HEMATOLOGY/ONCOLOGY CONSULTATION NOTE  Date of Service: 12/21/2018  Patient Care Team: [name], MD as PCP - General (Internal Medicine)  CHIEF COMPLAINTS/PURPOSE OF CONSULTATION:  Newly diagnosed follicular lymphoma  HISTORY OF PRESENTING ILLNESS:   Meghan Alexander is a wonderful 63 y.o. female who has been referred to [place] by Dr Layne Benton for evaluation and management of possible lymphoproliferative disease.The pt reports that she is doing well overall.  The pt reports that she had an MRI earlier this month because of her back pain. She has had back pain for about 30 years, but in May, it got so bad that she could not move. The pain radiated around her hip and down to her knee. She could not even get in her car to go to the ER. She put herself on bedrest for 5 weeks which improved her symptoms. Now, her back pain is back to baseline.  Pt reported that, in the past 6 months, she has been moving her bowels 3-4x daily, which is much more than normal for her. Throughout the day, her stools get progressively looser. She went to the ER on 10/11/2018 for food poisoning from mayonnaise because she had blood in the stool and cramping. The symptoms began 2 hours after she ate the mayo. She was not placed on antibiotics or any other medications. No stool testing was done. Her symptoms resolved in 2 days.  She experienced night sweats up until 2006, and they returned 6 months ago. She does not take hormone replacements because of her FHx of breast cancer. She also reports some new fatigue, dry eyes, and headaches over the last 6 months. Denies unexpected weight loss. She has not been diagnosed with diabetes.  Of note prior to the patient's visit today, pt has had MRI lumbar spine wo contrast completed on 11/02/2018 with results revealing "Extensive retroperitoneal lymphadenopathy highly worrisome for neoplastic process such as lymphoproliferative disease. 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 There is mild central canal narrowing overall at this Level."  Most recent lab results (10/11/2018) of CBC is as follows: all values are WNL except for Potassium at 3.3, glucose bld at 212. 10/11/2018 Urinalysis revealed hazy appearance 10/11/2018 Lipase at 29  On review of systems, pt reports back pain, 3-4 bowel movements daily, dry eyes, headaches, night sweats, and denies unexpected weight loss, mouth sores, belly pain, changes in urination trouble swallowing, fever, chills, and any other symptoms.   On PMHx the pt reports total hysterectomy at 38 years ago due to ovarian cysts, lymphoreticulosis, sleep apnea related surgery, breast augmentation with no removal of breast tissue On Social Hx the pt reports never smoking On Family Hx the pt reports breast cancer on both sides of the family.    INTERVAL HISTORY  Meghan Alexander is a 63 y.o. female presenting today for the management and evaluation of Retroperitoneal Lymphadenopathy. The patient's last visit with [place] was on 12/13/2018. The pt reports that she is doing well overall.  Of note since the patient's last visit, pt has had a core needle lymph node biopsy completed on 12/18/2018 with results revealing a follicular lymphoma. The final pathology report was not available at the time of the appointment and was reported orally via Dr. Melina Copa.   Lab results (12/18/18) of CBC w/diff is as follows: all values are WNL.  On review of systems, pt denies abdominal pain and any other symptoms.    MEDICAL HISTORY:  Past Medical History:  Diagnosis Date   Allergy    year around allergies   Arthritis    fingers, knees, neck, back-osteoarthristis   Bronchitis    hx of   Complication of anesthesia    GERD (gastroesophageal reflux disease)    hx of reflux-went away with  elevating HOB    Hypertension    Pneumonia    hx of    PONV (postoperative nausea and vomiting)     SURGICAL HISTORY: Past Surgical History:  Procedure Laterality Date   ABDOMINAL HYSTERECTOMY     ADENOIDECTOMY     cosmetic surgeries     ESOPHAGEAL MANOMETRY N/A 12/02/2015   Procedure: ESOPHAGEAL MANOMETRY (EM);  Surgeon: [name], MD;  Location: [facility];  Service: Endoscopy;  Laterality: N/A;   ESOPHAGOGASTRODUODENOSCOPY N/A 12/02/2015   Procedure: ESOPHAGOGASTRODUODENOSCOPY (EGD);  Surgeon: [name], MD;  Location: [facility];  Service: Endoscopy;  Laterality: N/A;   lymph node removal left leg     cat scratch surgery    THROAT SURGERY     sleep apnea surgery    TONSILLECTOMY      SOCIAL HISTORY: Social History   Socioeconomic History   Marital status: Married    Spouse name: Not on file   Number of children: Not on file   Years of education: Not on file   Highest education level: Not on file  Occupational History   Not on file  Social Needs   Financial resource strain: Not on file   Food insecurity    Worry: Not on file    Inability: Not on file   Transportation needs    Medical: Not on file    Non-medical: Not on file  Tobacco Use   Smoking status: Never Smoker   Smokeless tobacco: Never Used  Substance and Sexual Activity   Alcohol use: No    Alcohol/week: 0.0 standard drinks   Drug use: No   Sexual activity: Not on file  Lifestyle   Physical activity    Days per week: Not on file    Minutes per session: Not on file   Stress: Not on file  Relationships   Social connections    Talks on phone: Not on file    Gets together: Not on file    Attends religious service: Not on file    Active member of club or organization: Not on file    Attends meetings of clubs or organizations: Not on file    Relationship status: Not on file   Intimate partner violence    Fear of current or ex partner: Not on file     Emotionally abused: Not on file    Physically abused: Not on file    Forced sexual activity: Not on file  Other Topics Concern   Not on file  Social History Narrative   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 [employer]   Never smoker never drinker   No drugs   Multiple allergies   Lives at home with her husband of 8 years since 2008    FAMILY HISTORY: Family History  Problem Relation Age of Onset   Lung cancer Father        1982 died from it   Hernia Mother    Breast cancer Maternal Aunt        multiple    Breast cancer Maternal  Grandmother    Breast cancer Paternal Grandmother    Breast cancer Cousin        cousins multiple    Colon cancer Neg Hx    Colon polyps Neg Hx    Rectal cancer Neg Hx    Stomach cancer Neg Hx     ALLERGIES:  is allergic to codeine; other; hydrocodone; latex; and oxycodone.  MEDICATIONS:  Current Outpatient Medications  Medication Sig Dispense Refill   Biotin 7500 MCG TABS Take 1 tablet by mouth daily.     diphenhydrAMINE (BENADRYL) 25 MG tablet Take 25 mg by mouth at bedtime.     Lactobacillus-Inulin (CULTURELLE DIGESTIVE HEALTH PO) Take 1 capsule by mouth at bedtime.     lisinopril-hydrochlorothiazide (PRINZIDE,ZESTORETIC) 10-12.5 MG per tablet Take 1 tablet by mouth daily.      Melatonin 10 MG CAPS Take 10 mg by mouth at bedtime.      metoprolol succinate (TOPROL-XL) 50 MG 24 hr tablet Take 50 mg by mouth daily.     Multiple Vitamin (MULTIVITAMIN) tablet Take 1 tablet by mouth daily.     rosuvastatin (CRESTOR) 10 MG tablet Take 10 mg by mouth daily.     No current facility-administered medications for this visit.     REVIEW OF SYSTEMS: A 10+ POINT REVIEW OF SYSTEMS WAS OBTAINED including neurology, dermatology, psychiatry, cardiac, respiratory, lymph, extremities, GI, GU, Musculoskeletal, constitutional, breasts, reproductive, HEENT.  All pertinent positives are noted in the HPI.  All others are  negative.    PHYSICAL EXAMINATION: ECOG PERFORMANCE STATUS: 1 - Symptomatic but completely ambulatory  . Vitals:   12/21/18 0912  BP: 126/76  Pulse: 73  Resp: 18  Temp: (!) 97 F (36.1 C)  SpO2: 98%   Filed Weights   12/21/18 0912  Weight: 176 lb 6.4 oz (80 kg)   .Body mass index is 32.26 kg/m.   GENERAL:alert, in no acute distress and comfortable SKIN: no acute rashes, no significant lesions EYES: conjunctiva are pink and non-injected, sclera anicteric OROPHARYNX: MMM, no exudates, no oropharyngeal erythema or ulceration NECK: supple, no JVD LYMPH:  no palpable lymphadenopathy in the cervical, axillary or inguinal regions LUNGS: clear to auscultation b/l with normal respiratory effort HEART: regular rate & rhythm ABDOMEN:  normoactive bowel sounds , non tender, not distended. Extremity: no pedal edema PSYCH: alert & oriented x 3 with fluent speech NEURO: no focal motor/sensory deficits    LABORATORY DATA:  I have reviewed the data as listed  . CBC Latest Ref Rng & Units 12/18/2018 11/20/2018 10/11/2018  WBC 4.0 - 10.5 K/uL 8.5 8.6 10.3  Hemoglobin 12.0 - 15.0 g/dL 14.4 14.6 14.9  Hematocrit 36.0 - 46.0 % 41.6 42.3 44.3  Platelets 150 - 400 K/uL 200 187 185    . CMP Latest Ref Rng & Units 11/20/2018 10/11/2018 10/18/2014  Glucose 70 - 99 mg/dL 119(H) 212(H) 241(H)  BUN 8 - 23 mg/dL _0 Creatinine 0.44 - 1.00 mg/dL 0.75 0.70 0.67  Sodium 135 - 145 mmol/L 139 139 136  Potassium 3.5 - 5.1 mmol/L 3.9 3.3(L) 3.8  Chloride 98 - 111 mmol/L 103 103 97(L)  CO2 22 - 32 mmol/L _1 Calcium 8.9 - 10.3 mg/dL 9.9 9.5 8.8(L)  Total Protein 6.5 - 8.1 g/dL 7.5 6.9 6.2(L)  Total Bilirubin 0.3 - 1.2 mg/dL 0.4 0.7 0.5  Alkaline Phos 38 - 126 U/L 68 61 79  AST 15 - 41 U/L 23 25 14(L)  ALT 0 -  44 U/L 34 36 15   . Lab Results  Component Value Date   LDH 217 (H) 11/20/2018      RADIOGRAPHIC STUDIES: I have personally reviewed the radiological images as listed  and agreed with the findings in the report. Ct Chest W Contrast  Result Date: 12/04/2018 CLINICAL DATA:  Retroperitoneal lymphadenopathy on recent lumbar spine MRI. Back pain. Possible lymphoma. EXAM: CT CHEST, ABDOMEN, AND PELVIS WITH CONTRAST TECHNIQUE: Multidetector CT imaging of the chest, abdomen and pelvis was performed following the standard protocol during bolus administration of intravenous contrast. CONTRAST:  154m OMNIPAQUE IOHEXOL 300 MG/ML  SOLN COMPARISON:  Chest CT on 06/13/2016; no prior AP CT FINDINGS: CT CHEST FINDINGS Cardiovascular: No acute findings. Mediastinum/Lymph Nodes: New 9 mm mediastinal lymph node in left paratracheal region on image 13/2. shotty sub-cm bilateral axillary lymph nodes are unchanged. Diffuse esophageal dilatation again demonstrated but mildly decreased since previous study. No evidence of hiatal hernia. Lungs/Pleura: Stable 8 mm pulmonary nodule in the posterior lingula. Stable mild lingular scarring. No other suspicious nodules or masses identified. No evidence of pulmonary infiltrate or pleural effusion. Musculoskeletal:  No suspicious bone lesions identified. CT ABDOMEN AND PELVIS FINDINGS Hepatobiliary: Moderate diffuse hepatic steatosis. 1.3 cm nonspecific low-attenuation lesion in the inferior right hepatic lobe on image 64/2. Gallbladder is unremarkable. No evidence of biliary ductal dilatation. Pancreas:  No mass or inflammatory changes. Spleen:  Within normal limits in size and appearance. Adrenals/Urinary tract:  No masses or hydronephrosis. Stomach/Bowel: No evidence of obstruction, inflammatory process, or abnormal fluid collections. Vascular/Lymphatic: Mild-to-moderate lymphadenopathy is seen throughout the abdominal retroperitoneum index lymph node in left paraaortic region measures 1.6 cm on image 78/2. Bilateral common iliac lymphadenopathy is also seen, with largest lymph node on the right measuring 2.8 cm on image 89/2. Aortic atherosclerosis. No  abdominal aortic aneurysm. Reproductive: Prior hysterectomy noted. Adnexal regions are unremarkable in appearance. Other:  None. Musculoskeletal:  No suspicious bone lesions identified. IMPRESSION: Mild-to-moderate abdominal and pelvic lymphadenopathy and new 9 mm left superior mediastinal lymph node, highly suspicious for lymphoproliferative disorder. Stable 8 mm lingular pulmonary nodule, consistent with benign etiology. Moderate hepatic steatosis. 1.3 cm indeterminate low-attenuation lesion in the right lobe. Recommend continued attention on follow-up CT. Electronically Signed   By: JMarlaine HindM.D.   On: 12/04/2018 14:11   Ct Abdomen Pelvis W Contrast  Result Date: 12/04/2018 CLINICAL DATA:  Retroperitoneal lymphadenopathy on recent lumbar spine MRI. Back pain. Possible lymphoma. EXAM: CT CHEST, ABDOMEN, AND PELVIS WITH CONTRAST TECHNIQUE: Multidetector CT imaging of the chest, abdomen and pelvis was performed following the standard protocol during bolus administration of intravenous contrast. CONTRAST:  1023mOMNIPAQUE IOHEXOL 300 MG/ML  SOLN COMPARISON:  Chest CT on 06/13/2016; no prior AP CT FINDINGS: CT CHEST FINDINGS Cardiovascular: No acute findings. Mediastinum/Lymph Nodes: New 9 mm mediastinal lymph node in left paratracheal region on image 13/2. shotty sub-cm bilateral axillary lymph nodes are unchanged. Diffuse esophageal dilatation again demonstrated but mildly decreased since previous study. No evidence of hiatal hernia. Lungs/Pleura: Stable 8 mm pulmonary nodule in the posterior lingula. Stable mild lingular scarring. No other suspicious nodules or masses identified. No evidence of pulmonary infiltrate or pleural effusion. Musculoskeletal:  No suspicious bone lesions identified. CT ABDOMEN AND PELVIS FINDINGS Hepatobiliary: Moderate diffuse hepatic steatosis. 1.3 cm nonspecific low-attenuation lesion in the inferior right hepatic lobe on image 64/2. Gallbladder is unremarkable. No evidence of  biliary ductal dilatation. Pancreas:  No mass or inflammatory changes. Spleen:  Within normal limits  in size and appearance. Adrenals/Urinary tract:  No masses or hydronephrosis. Stomach/Bowel: No evidence of obstruction, inflammatory process, or abnormal fluid collections. Vascular/Lymphatic: Mild-to-moderate lymphadenopathy is seen throughout the abdominal retroperitoneum index lymph node in left paraaortic region measures 1.6 cm on image 78/2. Bilateral common iliac lymphadenopathy is also seen, with largest lymph node on the right measuring 2.8 cm on image 89/2. Aortic atherosclerosis. No abdominal aortic aneurysm. Reproductive: Prior hysterectomy noted. Adnexal regions are unremarkable in appearance. Other:  None. Musculoskeletal:  No suspicious bone lesions identified. IMPRESSION: Mild-to-moderate abdominal and pelvic lymphadenopathy and new 9 mm left superior mediastinal lymph node, highly suspicious for lymphoproliferative disorder. Stable 8 mm lingular pulmonary nodule, consistent with benign etiology. Moderate hepatic steatosis. 1.3 cm indeterminate low-attenuation lesion in the right lobe. Recommend continued attention on follow-up CT. Electronically Signed   By: Marlaine Hind M.D.   On: 12/04/2018 14:11   Ct Biopsy  Result Date: 12/18/2018 CLINICAL DATA:  Retroperitoneal lymphadenopathy. No known primary malignancy. Biopsy requested EXAM: CT GUIDED CORE BIOPSY OF LEFT PARA-AORTIC RETROPERITONEAL ADENOPATHY ANESTHESIA/SEDATION: Intravenous Fentanyl 53mg and Versed 1.532mwere administered as conscious sedation during continuous monitoring of the patient's level of consciousness and physiological / cardiorespiratory status by the radiology RN, with a total moderate sedation time of 12 minutes. PROCEDURE: The procedure risks, benefits, and alternatives were explained to the patient. Questions regarding the procedure were encouraged and answered. The patient understands and consents to the procedure.  Patient placed prone. Select axial scans through the abdomen obtained. The left retroperitoneal adenopathy was localized and an appropriate skin entry site was determined and marked. The operative field was prepped with chlorhexidinein a sterile fashion, and a sterile drape was applied covering the operative field. A sterile gown and sterile gloves were used for the procedure. Local anesthesia was provided with 1% Lidocaine. Under CT fluoroscopic guidance, a 17 gauge trocar needle was advanced to the margin of the lesion. Once needle tip position was confirmed, coaxial 18-gauge core biopsy samples were obtained, submitted in saline to surgical pathology. The guide needle was removed. Postprocedure scans show no hemorrhage or other apparent complication. The patient tolerated the procedure well. COMPLICATIONS: None immediate FINDINGS: Left para-aortic retroperitoneal adenopathy was localized. Representative core biopsy samples obtained as above. IMPRESSION: 1. Technically successful CT-guided core biopsy, left para-aortic retroperitoneal adenopathy. Electronically Signed   By: D Lucrezia Europe.D.   On: 12/18/2018 10:27    ASSESSMENT & PLAN:   #1 Retroperitoneal lymphadenopathy -  Newly Diagnosed follicular lymphoma - likely low grade but accurate grading difficult  11/02/2018 MRI lumbar spine wo contrast revealed "Extensive retroperitoneal lymphadenopathy highly worrisome for neoplastic process such as lymphoproliferative disease. Spondylosis worst at L4-5 where there is narrowing in the right subarticular recess predominantly due to a synovial cyst off the medial margin of the right facet with encroachment on the descending right L5 root. There is mild central canal narrowing overall at this Level."  12/04/2018 CT CAP revealed "Mild-to-moderate abdominal and pelvic lymphadenopathy and new 9 mm left superior mediastinal lymph node, highly suspicious for lymphoproliferative disorder. Stable 8 mm lingular  pulmonary nodule, consistent with benign Etiology. Moderate hepatic steatosis. 1.3 cm indeterminate low-attenuation lesion in the right lobe. Recommend continued attention on follow-up CT."   PLAN: -Discussed pt labwork from 12/18/18; all values are WNL -Discussed 12/18/2018 lymph node biopsy pathology revealing a follicular lymphoma- likely low grade but accurate grading not possible on limited sampling. -atleast stage III based on CT information. -Discussed lymphoma and types of non-Hodgkin's  lymphoma -Discussed need for PET scan to determine - FDG avidity to determine if this consistent with low grade follicular lymphoma and to determine best target for possible excisional lymph node biopsy -I do not feel strongly about a bone marrow biopsy at this point   FOLLOW UP: PET/CT in 1 week Phone visit with Dr Irene Limbo in 2weeks   The total time spent in the appt was 25 minutes and more than 50% was on counseling and direct patient cares.  All of the patient's questions were answered with apparent satisfaction. The patient knows to call the clinic with any problems, questions or concerns.   Sullivan Lone MD MS AAHIVMS [facility] Hematology/Oncology Physician [facility]  (Office):       [phone] (Work cell):  [phone] (Fax):           [phone]  12/21/2018 11:23 AM  I, Jacqualyn Posey, am acting as a Education administrator for Dr. Sullivan Lone.   .I have reviewed the above documentation for accuracy and completeness, and I agree with the above. Brunetta Genera MD

## 2018-12-21 ENCOUNTER — Other Ambulatory Visit: Payer: Self-pay

## 2018-12-21 ENCOUNTER — Telehealth: Payer: Self-pay | Admitting: Hematology

## 2018-12-21 ENCOUNTER — Inpatient Hospital Stay (HOSPITAL_BASED_OUTPATIENT_CLINIC_OR_DEPARTMENT_OTHER): Payer: Medicare Other | Admitting: Hematology

## 2018-12-21 VITALS — BP 126/76 | HR 73 | Temp 97.0°F | Resp 18 | Ht 62.0 in | Wt 176.4 lb

## 2018-12-21 DIAGNOSIS — R590 Localized enlarged lymph nodes: Secondary | ICD-10-CM | POA: Diagnosis not present

## 2018-12-21 DIAGNOSIS — C8298 Follicular lymphoma, unspecified, lymph nodes of multiple sites: Secondary | ICD-10-CM

## 2018-12-21 NOTE — Telephone Encounter (Signed)
Scheduled per 08/28 los, patient is notification.

## 2018-12-28 ENCOUNTER — Encounter (HOSPITAL_COMMUNITY): Payer: Self-pay

## 2018-12-28 ENCOUNTER — Ambulatory Visit (HOSPITAL_COMMUNITY): Admission: RE | Admit: 2018-12-28 | Payer: Medicare Other | Source: Ambulatory Visit

## 2019-01-04 ENCOUNTER — Other Ambulatory Visit: Payer: Self-pay

## 2019-01-04 ENCOUNTER — Inpatient Hospital Stay: Payer: Medicare Other | Attending: Hematology | Admitting: Hematology

## 2019-01-07 ENCOUNTER — Telehealth: Payer: Self-pay | Admitting: Hematology

## 2019-01-07 NOTE — Telephone Encounter (Signed)
No los per 9/11.

## 2019-01-07 NOTE — Progress Notes (Signed)
This encounter was created in error - please disregard.

## 2019-01-24 ENCOUNTER — Telehealth: Payer: Self-pay | Admitting: *Deleted

## 2019-01-24 NOTE — Telephone Encounter (Signed)
Records faxed to [facility] - att Amy - Release [id number]

## 2019-01-25 ENCOUNTER — Telehealth: Payer: Self-pay | Admitting: *Deleted

## 2019-01-25 NOTE — Telephone Encounter (Signed)
Patient called  - stated she spoke with insurance company yesterday Michaell Cowing Ref# [id number]) regarding PET scan that was denied. Insurance company requested updated information from MD to include that a biopsy has been completed. Information shared by patient was sent to staff w/[employer]. Informed patient of same.

## 2019-02-06 ENCOUNTER — Other Ambulatory Visit: Payer: Self-pay

## 2019-02-06 ENCOUNTER — Encounter (HOSPITAL_COMMUNITY)
Admission: RE | Admit: 2019-02-06 | Discharge: 2019-02-06 | Disposition: A | Discharge status: Home / Self Care | Payer: Medicare Other | Source: Ambulatory Visit | Attending: Hematology | Admitting: Hematology

## 2019-02-06 DIAGNOSIS — K760 Fatty (change of) liver, not elsewhere classified: Secondary | ICD-10-CM | POA: Insufficient documentation

## 2019-02-06 DIAGNOSIS — I700 Atherosclerosis of aorta: Secondary | ICD-10-CM | POA: Diagnosis not present

## 2019-02-06 DIAGNOSIS — R599 Enlarged lymph nodes, unspecified: Secondary | ICD-10-CM | POA: Diagnosis present

## 2019-02-06 DIAGNOSIS — R591 Generalized enlarged lymph nodes: Secondary | ICD-10-CM | POA: Diagnosis not present

## 2019-02-06 DIAGNOSIS — R911 Solitary pulmonary nodule: Secondary | ICD-10-CM | POA: Diagnosis not present

## 2019-02-06 LAB — GLUCOSE, CAPILLARY: Glucose-Capillary: 135 mg/dL — ABNORMAL HIGH (ref 70–99)

## 2019-02-06 MED ORDER — FLUDEOXYGLUCOSE F - 18 (FDG) INJECTION
9.5600 | Freq: Once | INTRAVENOUS | Status: AC | PRN
  Administered 2019-02-06: 9.56 via INTRAVENOUS

## 2019-02-12 DIAGNOSIS — C8290 Follicular lymphoma, unspecified, unspecified site: Secondary | ICD-10-CM | POA: Insufficient documentation

## 2019-02-18 ENCOUNTER — Telehealth: Payer: Self-pay | Admitting: Hematology

## 2019-02-18 ENCOUNTER — Other Ambulatory Visit: Payer: Self-pay | Admitting: *Deleted

## 2019-02-18 DIAGNOSIS — C8298 Follicular lymphoma, unspecified, lymph nodes of multiple sites: Secondary | ICD-10-CM

## 2019-02-18 NOTE — Progress Notes (Unsigned)
Opened encounter in error  

## 2019-02-18 NOTE — Telephone Encounter (Signed)
Scheduled appt per 10/26 sch message - unable to schedule within a week - pt is out of town . Scheduled at the next available when pt is back in town

## 2019-02-20 ENCOUNTER — Other Ambulatory Visit: Payer: Medicare Other

## 2019-02-20 ENCOUNTER — Ambulatory Visit: Payer: Medicare Other | Admitting: Hematology

## 2019-03-05 ENCOUNTER — Encounter (HOSPITAL_COMMUNITY): Payer: Self-pay | Admitting: Radiology

## 2019-03-05 ENCOUNTER — Inpatient Hospital Stay (HOSPITAL_BASED_OUTPATIENT_CLINIC_OR_DEPARTMENT_OTHER): Payer: Medicare Other | Admitting: Hematology

## 2019-03-05 ENCOUNTER — Inpatient Hospital Stay: Payer: Medicare Other | Attending: Hematology

## 2019-03-05 ENCOUNTER — Other Ambulatory Visit: Payer: Self-pay

## 2019-03-05 VITALS — BP 154/75 | HR 73 | Temp 98.3°F | Resp 18 | Ht 62.0 in | Wt 199.2 lb

## 2019-03-05 DIAGNOSIS — C8298 Follicular lymphoma, unspecified, lymph nodes of multiple sites: Secondary | ICD-10-CM

## 2019-03-05 DIAGNOSIS — Z803 Family history of malignant neoplasm of breast: Secondary | ICD-10-CM | POA: Diagnosis not present

## 2019-03-05 DIAGNOSIS — M549 Dorsalgia, unspecified: Secondary | ICD-10-CM | POA: Insufficient documentation

## 2019-03-05 DIAGNOSIS — I700 Atherosclerosis of aorta: Secondary | ICD-10-CM | POA: Diagnosis not present

## 2019-03-05 DIAGNOSIS — K760 Fatty (change of) liver, not elsewhere classified: Secondary | ICD-10-CM | POA: Diagnosis not present

## 2019-03-05 DIAGNOSIS — Z79899 Other long term (current) drug therapy: Secondary | ICD-10-CM | POA: Diagnosis not present

## 2019-03-05 DIAGNOSIS — Z801 Family history of malignant neoplasm of trachea, bronchus and lung: Secondary | ICD-10-CM | POA: Diagnosis not present

## 2019-03-05 DIAGNOSIS — C8293 Follicular lymphoma, unspecified, intra-abdominal lymph nodes: Secondary | ICD-10-CM | POA: Insufficient documentation

## 2019-03-05 LAB — CMP (CANCER CENTER ONLY)
ALT: 37 U/L (ref 0–44)
AST: 24 U/L (ref 15–41)
Albumin: 4.2 g/dL (ref 3.5–5.0)
Alkaline Phosphatase: 60 U/L (ref 38–126)
Anion gap: 11 (ref 5–15)
BUN: 14 mg/dL (ref 8–23)
CO2: 28 mmol/L (ref 22–32)
Calcium: 8.8 mg/dL — ABNORMAL LOW (ref 8.9–10.3)
Chloride: 102 mmol/L (ref 98–111)
Creatinine: 0.83 mg/dL (ref 0.44–1.00)
GFR, Est AFR Am: >60 mL/min (ref >60)
GFR, Estimated: >60 mL/min (ref >60)
Glucose, Bld: 125 mg/dL — ABNORMAL HIGH (ref 70–99)
Potassium: 3.9 mmol/L (ref 3.5–5.1)
Sodium: 141 mmol/L (ref 135–145)
Total Bilirubin: 0.4 mg/dL (ref 0.3–1.2)
Total Protein: 6.8 g/dL (ref 6.5–8.1)

## 2019-03-05 LAB — CBC WITH DIFFERENTIAL (CANCER CENTER ONLY)
Abs Immature Granulocytes: 0.04 10*3/uL (ref 0.00–0.07)
Basophils Absolute: 0.1 10*3/uL (ref 0.0–0.1)
Basophils Relative: 1 %
Eosinophils Absolute: 0.2 10*3/uL (ref 0.0–0.5)
Eosinophils Relative: 2 %
HCT: 40 % (ref 36.0–46.0)
Hemoglobin: 13.9 g/dL (ref 12.0–15.0)
Immature Granulocytes: 1 %
Lymphocytes Relative: 34 %
Lymphs Abs: 2.7 10*3/uL (ref 0.7–4.0)
MCH: 30.9 pg (ref 26.0–34.0)
MCHC: 34.8 g/dL (ref 30.0–36.0)
MCV: 88.9 fL (ref 80.0–100.0)
Monocytes Absolute: 1.1 10*3/uL — ABNORMAL HIGH (ref 0.1–1.0)
Monocytes Relative: 14 %
Neutro Abs: 4 10*3/uL (ref 1.7–7.7)
Neutrophils Relative %: 48 %
Platelet Count: 181 10*3/uL (ref 150–400)
RBC: 4.5 MIL/uL (ref 3.87–5.11)
RDW: 11.8 % (ref 11.5–15.5)
WBC Count: 8.1 10*3/uL (ref 4.0–10.5)
nRBC: 0 % (ref 0.0–0.2)

## 2019-03-05 LAB — LACTATE DEHYDROGENASE: LDH: 186 U/L (ref 98–192)

## 2019-03-05 NOTE — Progress Notes (Signed)
HEMATOLOGY/ONCOLOGY CONSULTATION NOTE  Date of Service: 03/05/2019  Patient Care Team: [name], MD as PCP - General (Internal Medicine)  CHIEF COMPLAINTS/PURPOSE OF CONSULTATION:  Newly diagnosed follicular lymphoma  HISTORY OF PRESENTING ILLNESS:   Meghan Alexander is a wonderful 63 y.o. female who has been referred to [place] by Dr Layne Benton for evaluation and management of possible lymphoproliferative disease.The pt reports that she is doing well overall.  The pt reports that she had an MRI earlier this month because of her back pain. She has had back pain for about 30 years, but in May, it got so bad that she could not move. The pain radiated around her hip and down to her knee. She could not even get in her car to go to the ER. She put herself on bedrest for 5 weeks which improved her symptoms. Now, her back pain is back to baseline.  Pt reported that, in the past 6 months, she has been moving her bowels 3-4x daily, which is much more than normal for her. Throughout the day, her stools get progressively looser. She went to the ER on 10/11/2018 for food poisoning from mayonnaise because she had blood in the stool and cramping. The symptoms began 2 hours after she ate the mayo. She was not placed on antibiotics or any other medications. No stool testing was done. Her symptoms resolved in 2 days.  She experienced night sweats up until 2006, and they returned 6 months ago. She does not take hormone replacements because of her FHx of breast cancer. She also reports some new fatigue, dry eyes, and headaches over the last 6 months. Denies unexpected weight loss. She has not been diagnosed with diabetes.  Of note prior to the patient's visit today, pt has had MRI lumbar spine wo contrast completed on 11/02/2018 with results revealing "Extensive retroperitoneal lymphadenopathy highly worrisome for neoplastic process such as lymphoproliferative disease. 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 There is mild central canal narrowing overall at this Level."  Most recent lab results (10/11/2018) of CBC is as follows: all values are WNL except for Potassium at 3.3, glucose bld at 212. 10/11/2018 Urinalysis revealed hazy appearance 10/11/2018 Lipase at 29  On review of systems, pt reports back pain, 3-4 bowel movements daily, dry eyes, headaches, night sweats, and denies unexpected weight loss, mouth sores, belly pain, changes in urination trouble swallowing, fever, chills, and any other symptoms.   On PMHx the pt reports total hysterectomy at 38 years ago due to ovarian cysts, lymphoreticulosis, sleep apnea related surgery, breast augmentation with no removal of breast tissue On Social Hx the pt reports never smoking On Family Hx the pt reports breast cancer on both sides of the family.    INTERVAL HISTORY  Meghan Alexander is a 63 y.o. female presenting today for the management and evaluation of Retroperitoneal Lymphadenopathy. The patient's last visit with [place] was on 12/21/2018. The pt reports that she is doing well overall.  The pt reports that she was prescribed Meloxicam for her back pain, which is helping a lot. Pt began taking Meloxicam yesterday. Pt also began Effexor for hot flashes about 3 weeks ago and does not feel that it is the cause of her diarrhea. Her hot flashes have since completely resolved. She is also experiencing diarrhea up to 5 times  per day about half of the time. The stools get progressively more watery with each movement. This has been going on for about 2 months. Pt denies any blood or mucous in the stools or pain and cramping during bowel movements. She has not spoken to her PCP since this began. She does eat a lot of bread and feels that her diarrhea may be due to gluten intolerance. The rest of  her medications have stayed the same. She is beginning PT tomorrow but notes that her back pain travels down her left side. Pt is excited to begin exercising again and is looking forward to doing yoga and riding her bicycle. Pt has had her tonsils removed and is not experiencing any discomfort in that area.   Of note since the patient's last visit, pt has had PET/CT scan ([id number]) completed on 02/06/2019 with results revealing "1. Bulky hypermetabolic lymphadenopathy in the abdomen and upper pelvis, as described. This is associated with mild hypermetabolic lymphadenopathy in the upper mediastinum and right axilla. Hypermetabolic lymphadenopathy represents a combination of Deauville 4 and Deauville 5 disease. 2. 8 mm nodule in the lingula without hypermetabolism. Attention on follow-up recommended. 3. Hepatic steatosis. 4.  Aortic Atherosclerois (ICD10-170.0)."  Lab results today (03/05/19) of CBC w/diff and CMP is as follows: all values are WNL except for Mono Abs at 1.1K, Glucose at 125, Calcium at 8.8. 03/05/2019 LDH at 186  On review of systems, pt reports chills, diarrhea, back pain and denies fevers, bloody stool/mucous in stool, abdominal pain and any other symptoms.   MEDICAL HISTORY:  Past Medical History:  Diagnosis Date   Allergy    year around allergies   Arthritis    fingers, knees, neck, back-osteoarthristis   Bronchitis    hx of   Complication of anesthesia    GERD (gastroesophageal reflux disease)    hx of reflux-went away with elevating HOB    Hypertension    Pneumonia    hx of    PONV (postoperative nausea and vomiting)     SURGICAL HISTORY: Past Surgical History:  Procedure Laterality Date   ABDOMINAL HYSTERECTOMY     ADENOIDECTOMY     cosmetic surgeries     ESOPHAGEAL MANOMETRY N/A 12/02/2015   Procedure: ESOPHAGEAL MANOMETRY (EM);  Surgeon: [name], MD;  Location: [facility];  Service: Endoscopy;  Laterality: N/A;    ESOPHAGOGASTRODUODENOSCOPY N/A 12/02/2015   Procedure: ESOPHAGOGASTRODUODENOSCOPY (EGD);  Surgeon: [name], MD;  Location: [facility];  Service: Endoscopy;  Laterality: N/A;   lymph node removal left leg     cat scratch surgery    THROAT SURGERY     sleep apnea surgery    TONSILLECTOMY      SOCIAL HISTORY: Social History   Socioeconomic History   Marital status: Married    Spouse name: Not on file   Number of children: Not on file   Years of education: Not on file   Highest education level: Not on file  Occupational History   Not on file  Social Needs   Financial resource strain: Not on file   Food insecurity    Worry: Not on file    Inability: Not on file   Transportation needs    Medical: Not on file    Non-medical: Not on file  Tobacco Use   Smoking status: Never Smoker   Smokeless tobacco: Never Used  Substance and Sexual Activity   Alcohol use: No    Alcohol/week: 0.0 standard drinks   Drug use:  No   Sexual activity: Not on file  Lifestyle   Physical activity    Days per week: Not on file    Minutes per session: Not on file   Stress: Not on file  Relationships   Social connections    Talks on phone: Not on file    Gets together: Not on file    Attends religious service: Not on file    Active member of club or organization: Not on file    Attends meetings of clubs or organizations: Not on file    Relationship status: Not on file   Intimate partner violence    Fear of current or ex partner: Not on file    Emotionally abused: Not on file    Physically abused: Not on file    Forced sexual activity: Not on file  Other Topics Concern   Not on file  Social History Narrative   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 [employer]   Never smoker never drinker   No drugs   Multiple allergies   Lives at home with her husband of 8 years since 2008    FAMILY HISTORY: Family History  Problem Relation Age of  Onset   Lung cancer Father        1982 died from it   Hernia Mother    Breast cancer Maternal Aunt        multiple    Breast cancer Maternal Grandmother    Breast cancer Paternal Grandmother    Breast cancer Cousin        cousins multiple    Colon cancer Neg Hx    Colon polyps Neg Hx    Rectal cancer Neg Hx    Stomach cancer Neg Hx     ALLERGIES:  is allergic to codeine; other; hydrocodone; latex; and oxycodone.  MEDICATIONS:  Current Outpatient Medications  Medication Sig Dispense Refill   Biotin 7500 MCG TABS Take 1 tablet by mouth daily.     diphenhydrAMINE (BENADRYL) 25 MG tablet Take 25 mg by mouth at bedtime.     Lactobacillus-Inulin (CULTURELLE DIGESTIVE HEALTH PO) Take 1 capsule by mouth at bedtime.     lisinopril-hydrochlorothiazide (PRINZIDE,ZESTORETIC) 10-12.5 MG per tablet Take 1 tablet by mouth daily.      Melatonin 10 MG CAPS Take 10 mg by mouth at bedtime.      metoprolol succinate (TOPROL-XL) 50 MG 24 hr tablet Take 50 mg by mouth daily.     Multiple Vitamin (MULTIVITAMIN) tablet Take 1 tablet by mouth daily.     rosuvastatin (CRESTOR) 10 MG tablet Take 10 mg by mouth daily.     No current facility-administered medications for this visit.     REVIEW OF SYSTEMS: A 10+ POINT REVIEW OF SYSTEMS WAS OBTAINED including neurology, dermatology, psychiatry, cardiac, respiratory, lymph, extremities, GI, GU, Musculoskeletal, constitutional, breasts, reproductive, HEENT.  All pertinent positives are noted in the HPI.  All others are negative.   PHYSICAL EXAMINATION: ECOG PERFORMANCE STATUS: 1 - Symptomatic but completely ambulatory  . There were no vitals filed for this visit. There were no vitals filed for this visit. .There is no height or weight on file to calculate BMI.   GENERAL:alert, in no acute distress and comfortable SKIN: no acute rashes, no significant lesions EYES: conjunctiva are pink and non-injected, sclera anicteric OROPHARYNX:  MMM, no exudates, no oropharyngeal erythema or ulceration NECK: supple, no JVD LYMPH:  no palpable lymphadenopathy in the cervical, axillary or inguinal regions  LUNGS: clear to auscultation b/l with normal respiratory effort HEART: regular rate & rhythm ABDOMEN:  normoactive bowel sounds , non tender, not distended. No palpable hepatosplenomegaly.  Extremity: no pedal edema PSYCH: alert & oriented x 3 with fluent speech NEURO: no focal motor/sensory deficits  LABORATORY DATA:  I have reviewed the data as listed  . CBC Latest Ref Rng & Units 12/18/2018 11/20/2018 10/11/2018  WBC 4.0 - 10.5 K/uL 8.5 8.6 10.3  Hemoglobin 12.0 - 15.0 g/dL 14.4 14.6 14.9  Hematocrit 36.0 - 46.0 % 41.6 42.3 44.3  Platelets 150 - 400 K/uL 200 187 185    . CMP Latest Ref Rng & Units 11/20/2018 10/11/2018 10/18/2014  Glucose 70 - 99 mg/dL 119(H) 212(H) 241(H)  BUN 8 - 23 mg/dL 9 13 10   Creatinine 0.44 - 1.00 mg/dL 0.75 0.70 0.67  Sodium 135 - 145 mmol/L 139 139 136  Potassium 3.5 - 5.1 mmol/L 3.9 3.3(L) 3.8  Chloride 98 - 111 mmol/L 103 103 97(L)  CO2 22 - 32 mmol/L 25 25 28   Calcium 8.9 - 10.3 mg/dL 9.9 9.5 8.8(L)  Total Protein 6.5 - 8.1 g/dL 7.5 6.9 6.2(L)  Total Bilirubin 0.3 - 1.2 mg/dL 0.4 0.7 0.5  Alkaline Phos 38 - 126 U/L 68 61 79  AST 15 - 41 U/L 23 25 14(L)  ALT 0 - 44 U/L 34 36 15   . Lab Results  Component Value Date   LDH 217 (H) 11/20/2018      RADIOGRAPHIC STUDIES: I have personally reviewed the radiological images as listed and agreed with the findings in the report. Nm Pet Image Initial (pi) Skull Base To Thigh  Result Date: 02/06/2019 CLINICAL DATA:  Initial treatment strategy for retroperitoneal lymphadenopathy. Follicular lymphoma. EXAM: NUCLEAR MEDICINE PET SKULL BASE TO THIGH TECHNIQUE: 9.6 mCi F-18 FDG was injected intravenously. Full-ring PET imaging was performed from the skull base to thigh after the radiotracer. CT data was obtained and used for attenuation correction  and anatomic localization. Fasting blood glucose: 135 mg/dl COMPARISON:  CT 12/04/2018 FINDINGS: Mediastinal blood pool activity: SUV max 2.8 Liver activity: SUV max 4.2 NECK: Mildly asymmetric uptake in the right tonsillar region with SUV max = 4.8. Incidental CT findings: none CHEST: Hypermetabolic lymphadenopathy is seen in the upper mediastinum. Index 11 mm high left paraesophageal node (54/4) demonstrates SUV max = 5.7 (Deauville 4). Small prevascular nodes are hypermetabolic. No hypermetabolic hilar lymphadenopathy. 9 mm short axis right axillary node (62/4) shows low level FDG uptake with SUV max = 2.4 (Deauville 2). Incidental CT findings: Coronary artery calcification is evident. Atherosclerotic calcification is noted in the wall of the thoracic aorta. Esophagus is markedly patulous with layering fluid in the lumen suggesting dysmotility. Atelectasis or scarring noted in the lingula and there is a 8 mm lingular nodule visible on 41/8 without hypermetabolism. ABDOMEN/PELVIS: No abnormal hypermetabolic activity within the liver, pancreas, adrenal glands, or spleen. The retroperitoneal lymphadenopathy is markedly hypermetabolic. Index 1.8 cm short axis left para-aortic node on 01/20 6/4 demonstrates SUV max = 8.3 (Deauville 5). Low left paraesophageal node measuring 1.9 cm short axis on 01/30 5/4 demonstrates SUV max = 8.1 (Deauville 5). Bulky right common iliac node measuring 2.7 cm short axis demonstrates SUV max = 10.3 (Deauville 5). Incidental CT findings: Diffuse decreased attenuation of liver parenchyma is compatible with fatty deposition. Numerous calcified granulomata are noted in the spleen. There is abdominal aortic atherosclerosis without aneurysm. SKELETON: No focal hypermetabolic activity to suggest skeletal metastasis. Incidental CT  findings: none IMPRESSION: 1. Bulky hypermetabolic lymphadenopathy in the abdomen and upper pelvis, as described. This is associated with mild hypermetabolic  lymphadenopathy in the upper mediastinum and right axilla. Hypermetabolic lymphadenopathy represents a combination of Deauville 4 and Deauville 5 disease. 2. 8 mm nodule in the lingula without hypermetabolism. Attention on follow-up recommended. 3. Hepatic steatosis. 4.  Aortic Atherosclerois (ICD10-170.0) Electronically Signed   By: Misty Stanley M.D.   On: 02/06/2019 10:13    ASSESSMENT & PLAN:   #1 Retroperitoneal lymphadenopathy -  Newly Diagnosed follicular lymphoma - likely low grade but accurate grading difficult  11/02/2018 MRI lumbar spine wo contrast revealed "Extensive retroperitoneal lymphadenopathy highly worrisome for neoplastic process such as lymphoproliferative disease. Spondylosis worst at L4-5 where there is narrowing in the right subarticular recess predominantly due to a synovial cyst off the medial margin of the right facet with encroachment on the descending right L5 root. There is mild central canal narrowing overall at this Level."  12/04/2018 CT CAP revealed "Mild-to-moderate abdominal and pelvic lymphadenopathy and new 9 mm left superior mediastinal lymph node, highly suspicious for lymphoproliferative disorder. Stable 8 mm lingular pulmonary nodule, consistent with benign Etiology. Moderate hepatic steatosis. 1.3 cm indeterminate low-attenuation lesion in the right lobe. Recommend continued attention on follow-up CT."  12/18/2018 lymph node biopsy pathology revealing a follicular lymphoma- likely low grade but accurate grading not possible on limited sampling.   PLAN: -Discussed pt labwork today, 03/05/19; blood counts are nml, blood chemistries are okay.  -Discussed 03/05/2019 LDH at 186, down from last LDH -Discussed 02/06/2019 PET/CT scan ([id number]) which revealed "1. Bulky hypermetabolic lymphadenopathy in the abdomen and upper pelvis, as described. This is associated with mild hypermetabolic lymphadenopathy in the upper mediastinum and right axilla. Hypermetabolic  lymphadenopathy represents a combination of Deauville 4 and Deauville 5 disease. 2. 8 mm nodule in the lingula without hypermetabolism. Attention on follow-up recommended. 3. Hepatic steatosis. 4.  Aortic Atherosclerois (ICD10-170.0)." -Recommended pt walk 20-30 mins per day and start low-impact exercises -Advised pt that we would typically move to treat a low-grade lymphoma only if it is: creating cytopenias, creating constitutional symptoms or bulky disease thats bothersome -Advised pt that we would move to treat immediately if it is a high-grade lymphoma -Advised pt that we can not confirm that she has a low-grade lymphoma at this time  -Discussed the pros and cons of needle Bx vs surgical Bx; would recommend a needle Bx at this time -Plan for a Right Iliac LN needle Bx due to high activity on PET/CT Scan -Will get a Right Iliac LN surgical Bx if needle Bx is not possible  -Will defer to recommendation of the Radiologist -Will get stool sample testing to r/o any infectious causes for pt's diarrhea -Will see back in 2 weeks   FOLLOW UP: -Ct biopsy of rt common iliac FDG avid lymphnode to define grade of follicular lymphoma with 1 week -RTC with Dr Irene Limbo in 2 weeks   The total time spent in the appt was 35 minutes and more than 50% was on counseling and direct patient cares.  All of the patient's questions were answered with apparent satisfaction. The patient knows to call the clinic with any problems, questions or concerns.  Sullivan Lone MD [place] AAHIVMS [facility] Gastroenterology Hematology/Oncology Physician [facility]  (Office):       [phone] (Work cell):  [phone] (Fax):           [phone]  03/05/2019 7:19 AM  I, Yevette Edwards,  am acting as a scribe for Dr. Sullivan Lone.   .I have reviewed the above documentation for accuracy and completeness, and I agree with the above. Brunetta Genera MD

## 2019-03-05 NOTE — Progress Notes (Unsigned)
Meghan Alexander "Cyndee" Female, 63 y.o., 1956/01/14 MRN:  [id number] Phone:  [phone] [name] PCP:  [name], MD Primary Cvg:  Generic Worker's Comp/Generic Worker's Comp Next Appt With Radiology ([facility]-CT 1) 03/08/2019 at 9:00 AM  RE: CT Biopsy Received: Today Message Contents  Arne Cleveland, MD  Jillyn Hidden        Ok   CT core R iliac LAN  See PET [id number]  Consider LLD positioning, lateral approach   DD[facility]   Previous Messages  ----- Message -----  From: Garth Bigness D  Sent: 03/05/2019  3:25 PM EST  To: Ir Procedure Requests  Subject: CT Biopsy                     Procedure:  CT Biopsy   Reason:  Follicular lymphoma of lymph nodes of multiple regions, unspecified follicular lymphoma type, Patient with follicular lymphoma not gradable on 1st biopsy. Please consider core needle biopsy of FDG avid rt common ilac lymphadenopathy ? low grade vs high grade follicular lymphoma   History:  CT, NM PET, CT Biopsy in computer   Dr. Brunetta Genera  [phone]

## 2019-03-06 ENCOUNTER — Telehealth: Payer: Self-pay | Admitting: Hematology

## 2019-03-06 NOTE — Telephone Encounter (Signed)
Scheduled per los. Called and left msg. Mailed printout  °

## 2019-03-07 ENCOUNTER — Other Ambulatory Visit: Payer: Self-pay | Admitting: Student

## 2019-03-07 ENCOUNTER — Other Ambulatory Visit: Payer: Self-pay | Admitting: Radiology

## 2019-03-08 ENCOUNTER — Encounter (HOSPITAL_COMMUNITY): Payer: Self-pay

## 2019-03-08 ENCOUNTER — Ambulatory Visit (HOSPITAL_COMMUNITY)
Admission: RE | Admit: 2019-03-08 | Discharge: 2019-03-08 | Disposition: A | Discharge status: Home / Self Care | Payer: Medicare Other | Source: Ambulatory Visit | Attending: Internal Medicine | Admitting: Internal Medicine

## 2019-03-08 ENCOUNTER — Other Ambulatory Visit: Payer: Self-pay

## 2019-03-08 ENCOUNTER — Ambulatory Visit (HOSPITAL_COMMUNITY)
Admission: RE | Admit: 2019-03-08 | Discharge: 2019-03-08 | Disposition: A | Discharge status: Home / Self Care | Payer: Medicare Other | Source: Ambulatory Visit | Attending: Hematology | Admitting: Hematology

## 2019-03-08 DIAGNOSIS — C8298 Follicular lymphoma, unspecified, lymph nodes of multiple sites: Secondary | ICD-10-CM | POA: Insufficient documentation

## 2019-03-08 DIAGNOSIS — Z79899 Other long term (current) drug therapy: Secondary | ICD-10-CM | POA: Insufficient documentation

## 2019-03-08 LAB — CBC
HCT: 40.5 % (ref 36.0–46.0)
Hemoglobin: 14 g/dL (ref 12.0–15.0)
MCH: 30.3 pg (ref 26.0–34.0)
MCHC: 34.6 g/dL (ref 30.0–36.0)
MCV: 87.7 fL (ref 80.0–100.0)
Platelets: 184 10*3/uL (ref 150–400)
RBC: 4.62 MIL/uL (ref 3.87–5.11)
RDW: 11.8 % (ref 11.5–15.5)
WBC: 8.2 10*3/uL (ref 4.0–10.5)
nRBC: 0 % (ref 0.0–0.2)

## 2019-03-08 LAB — PROTIME-INR
INR: 1 (ref 0.8–1.2)
Prothrombin Time: 13.5 seconds (ref 11.4–15.2)

## 2019-03-08 MED ORDER — MIDAZOLAM HCL 2 MG/2ML IJ SOLN
INTRAMUSCULAR | Status: AC | PRN
  Administered 2019-03-08: 1 mg via INTRAVENOUS

## 2019-03-08 MED ORDER — MIDAZOLAM HCL 2 MG/2ML IJ SOLN
INTRAMUSCULAR | Status: AC
  Filled 2019-03-08: qty 4

## 2019-03-08 MED ORDER — LIDOCAINE-EPINEPHRINE (PF) 1 %-1:200000 IJ SOLN
INTRAMUSCULAR | Status: AC | PRN
  Administered 2019-03-08: 10 mL

## 2019-03-08 MED ORDER — FLUMAZENIL 0.5 MG/5ML IV SOLN
INTRAVENOUS | Status: AC
  Filled 2019-03-08: qty 5

## 2019-03-08 MED ORDER — FENTANYL CITRATE (PF) 100 MCG/2ML IJ SOLN
INTRAMUSCULAR | Status: AC
  Filled 2019-03-08: qty 4

## 2019-03-08 MED ORDER — NALOXONE HCL 0.4 MG/ML IJ SOLN
INTRAMUSCULAR | Status: AC
  Filled 2019-03-08: qty 1

## 2019-03-08 MED ORDER — SODIUM CHLORIDE 0.9 % IV SOLN
INTRAVENOUS | Status: DC
  Administered 2019-03-08: 08:00:00 via INTRAVENOUS

## 2019-03-08 MED ORDER — FENTANYL CITRATE (PF) 100 MCG/2ML IJ SOLN
INTRAMUSCULAR | Status: AC | PRN
  Administered 2019-03-08: 50 ug via INTRAVENOUS

## 2019-03-08 NOTE — Procedures (Signed)
Pre procedural Dx: Follicular Lymphoma  Post procedural Dx: Same  Technically successful CT guided biopsy of left sided RP nodal conglomeration.    EBL: None.  Complications: None immediate.   Ronny Bacon, MD Pager #: [phone]

## 2019-03-08 NOTE — Progress Notes (Signed)
Patient ID: Meghan Alexander, female   DOB: 1955-10-16, 63 y.o.   MRN: [id number]    Referring Physician(s): [place]  Supervising Physician: Sandi Mariscal  Patient Status:  [facility] - [facility]  Chief Complaint:  None- patient presents for lymph node biopsy for further evaluation of follicular lymphoma grading.  Subjective:  63 y.o. female history of follicular lymphoma presents to [facility] for right common  iliac lymph node biopsy for grading purposes. Patient denies any fevers, headache, chest pain, SOB, cough, abdominal pain, nausea, vomiting or bleeding.    Allergies: Codeine, Other, Hydrocodone, Latex, and Oxycodone  Medications: Prior to Admission medications   Medication Sig Start Date End Date Taking? Authorizing Provider  Biotin 7500 MCG TABS Take 1 tablet by mouth daily.    [provider]  diphenhydrAMINE (BENADRYL) 25 MG tablet Take 25 mg by mouth at bedtime.    [provider]  Lactobacillus-Inulin (CULTURELLE DIGESTIVE HEALTH PO) Take 1 capsule by mouth at bedtime.    [provider]  lisinopril-hydrochlorothiazide (PRINZIDE,ZESTORETIC) 10-12.5 MG per tablet Take 1 tablet by mouth daily.  06/17/13   [provider]  Melatonin 10 MG CAPS Take 10 mg by mouth at bedtime.     [provider]  meloxicam (MOBIC) 15 MG tablet Take 15 mg by mouth daily.    [provider]  metoprolol succinate (TOPROL-XL) 50 MG 24 hr tablet Take 50 mg by mouth daily. 12/21/15   [provider]  Multiple Vitamin (MULTIVITAMIN) tablet Take 1 tablet by mouth daily.    [provider]  rosuvastatin (CRESTOR) 10 MG tablet Take 10 mg by mouth daily.    [provider]  venlafaxine (EFFEXOR) 37.5 MG tablet Take 37.5 mg by mouth once.    [provider]     Vital Signs: BP (!) 184/91   Pulse 71   Temp 98.7 F (37.1 C) (Oral)   Resp 16   SpO2 99%   Physical Exam Vital signs reviewed. Patient is awake and  alert. Chest CTA bialterally. Heart sounds with regular rate and rhythm, abdomen soft non tender, no lower extremity edema.    Imaging: No results found.  Labs:  CBC: Recent Labs    11/20/18 1204 12/18/18 0630 03/05/19 1356 03/08/19 0751  WBC 8.6 8.5 8.1 8.2  HGB 14.6 14.4 13.9 14.0  HCT 42.3 41.6 40.0 40.5  PLT 187 200 181 184    COAGS: Recent Labs    12/18/18 0630 03/08/19 0751  INR 1.1 1.0    BMP: Recent Labs    10/11/18 0936 11/20/18 1204 03/05/19 1356  NA 139 139 141  K 3.3* 3.9 3.9  CL 103 103 102  CO2 25 25 28   GLUCOSE 212* 119* 125*  BUN 13 9 14   CALCIUM 9.5 9.9 8.8*  CREATININE 0.70 0.75 0.83  GFRNONAA >60 >60 >60  GFRAA >60 >60 >60    LIVER FUNCTION TESTS: Recent Labs    10/11/18 0936 11/20/18 1204 03/05/19 1356  BILITOT 0.7 0.4 0.4  AST 25 23 24   ALT 36 34 37  ALKPHOS 61 68 60  PROT 6.9 7.5 6.8  ALBUMIN 4.4 4.4 4.2    Assessment and Plan:  63 y.o. female history of follicular lymphoma presents to [facility] for right common  iliac lymph node biopsy. PET from 10.14.20 reads Bulky right common iliac node measuring 2.7 cm short axis demonstrates SUV max = 10.3 (Deauville 5). Interventional radiology performed a core biopsy of the left para-aortic  retroperitoneal lymph node on 8.25.20 cytology reads Overall, a low grade follicular lymphoma is favored; however, grading is difficult to perform on core biopsies. Patient presents for biopsy of right common iliac node.  Risks and benefits of lymph node biopsy was discussed with the patient  including, but not limited to bleeding, infection, damage to adjacent structures or low yield requiring additional tests.  All of the questions were answered and there is agreement to proceed.  Consent signed and in chart.    Electronically Signed: Avel Peace, NP 03/08/2019, 8:28 AM   I spent a total of 15 Minutes at the the patient's bedside AND on the patient's hospital  floor or unit, greater than 50% of which was counseling/coordinating care for right common iliac lymph node biopsy

## 2019-03-08 NOTE — Discharge Instructions (Signed)
For any question or concerns call [phone]; for after hours call, [phone] and ask for on call MD  Moderate Conscious Sedation, Adult, Care After These instructions provide you with information about caring for yourself after your procedure. Your health care provider may also give you more specific instructions. Your treatment has been planned according to current medical practices, but problems sometimes occur. Call your health care provider if you have any problems or questions after your procedure. What can I expect after the procedure? After your procedure, it is common:  To feel sleepy for several hours.  To feel clumsy and have poor balance for several hours.  To have poor judgment for several hours.  To vomit if you eat too soon. Follow these instructions at home: For at least 24 hours after the procedure:   Do not: ? Participate in activities where you could fall or become injured. ? Drive. ? Use heavy machinery. ? Drink alcohol. ? Take sleeping pills or medicines that cause drowsiness. ? Make important decisions or sign legal documents. ? Take care of children on your own.  Rest. Eating and drinking  Follow the diet recommended by your health care provider.  If you vomit: ? Drink water, juice, or soup when you can drink without vomiting. ? Make sure you have little or no nausea before eating solid foods. General instructions  Have a responsible adult stay with you until you are awake and alert.  Take over-the-counter and prescription medicines only as told by your health care provider.  If you smoke, do not smoke without supervision.  Keep all follow-up visits as told by your health care provider. This is important. Contact a health care provider if:  You keep feeling nauseous or you keep vomiting.  You feel light-headed.  You develop a rash.  You have a fever. Get help right away if:  You have trouble breathing. This information is not intended  to replace advice given to you by your health care provider. Make sure you discuss any questions you have with your health care provider. Document Released: 01/30/2013 Document Revised: 03/24/2017 Document Reviewed: 08/01/2015 Elsevier Patient Education  2020 [place].   Needle Biopsy, Care After These instructions tell you how to care for yourself after your procedure. Your doctor may also give you more specific instructions. Call your doctor if you have any problems or questions. What can I expect after the procedure? After the procedure, it is common to have: Soreness. Bruising. Mild pain. Follow these instructions at home:  Return to your normal activities as told by your doctor. Ask your doctor what activities are safe for you. Take over-the-counter and prescription medicines only as told by your doctor. Wash your hands with soap and water before you change your bandage (dressing). If you cannot use soap and water, use hand sanitizer. Follow instructions from your doctor about: How to take care of your puncture site. When and how to change your bandage. When to remove your bandage. Check your puncture site every day for signs of infection. Watch for: Redness, swelling, or pain. Fluid or blood. Pus or a bad smell. Warmth. Do not take baths, swim, or use a hot tub until your doctor approves. Ask your doctor if you may take showers. You may only be allowed to take sponge baths. Keep all follow-up visits as told by your doctor. This is important. Contact a doctor if you have: A fever. Redness, swelling, or pain at the puncture site, and it lasts longer than  a few days. Fluid, blood, or pus coming from the puncture site. Warmth coming from the puncture site. Get help right away if: You have a lot of bleeding from the puncture site. Summary After the procedure, it is common to have soreness, bruising, or mild pain at the puncture site. Check your puncture site every day for  signs of infection, such as redness, swelling, or pain. Get help right away if you have severe bleeding from your puncture site. This information is not intended to replace advice given to you by your health care provider. Make sure you discuss any questions you have with your health care provider. Document Released: 03/24/2008 Document Revised: 04/24/2017 Document Reviewed: 04/24/2017 Elsevier Patient Education  2020 [employer].

## 2019-03-12 LAB — SURGICAL PATHOLOGY

## 2019-03-25 ENCOUNTER — Ambulatory Visit: Payer: Medicare Other | Admitting: Hematology

## 2019-03-27 ENCOUNTER — Inpatient Hospital Stay: Payer: Medicare Other | Attending: Hematology | Admitting: Hematology

## 2019-03-27 ENCOUNTER — Other Ambulatory Visit: Payer: Self-pay

## 2019-03-27 VITALS — BP 165/80 | HR 73 | Temp 97.8°F | Resp 18 | Ht 62.0 in | Wt 197.8 lb

## 2019-03-27 DIAGNOSIS — R61 Generalized hyperhidrosis: Secondary | ICD-10-CM | POA: Insufficient documentation

## 2019-03-27 DIAGNOSIS — R519 Headache, unspecified: Secondary | ICD-10-CM | POA: Insufficient documentation

## 2019-03-27 DIAGNOSIS — M25569 Pain in unspecified knee: Secondary | ICD-10-CM | POA: Diagnosis not present

## 2019-03-27 DIAGNOSIS — C8218 Follicular lymphoma grade II, lymph nodes of multiple sites: Secondary | ICD-10-CM | POA: Diagnosis not present

## 2019-03-27 DIAGNOSIS — Z5112 Encounter for antineoplastic immunotherapy: Secondary | ICD-10-CM | POA: Diagnosis present

## 2019-03-27 DIAGNOSIS — Z7189 Other specified counseling: Secondary | ICD-10-CM | POA: Diagnosis not present

## 2019-03-27 DIAGNOSIS — N83209 Unspecified ovarian cyst, unspecified side: Secondary | ICD-10-CM | POA: Insufficient documentation

## 2019-03-27 DIAGNOSIS — M25559 Pain in unspecified hip: Secondary | ICD-10-CM | POA: Diagnosis not present

## 2019-03-27 DIAGNOSIS — M47816 Spondylosis without myelopathy or radiculopathy, lumbar region: Secondary | ICD-10-CM | POA: Diagnosis not present

## 2019-03-27 DIAGNOSIS — R6889 Other general symptoms and signs: Secondary | ICD-10-CM | POA: Insufficient documentation

## 2019-03-27 DIAGNOSIS — M549 Dorsalgia, unspecified: Secondary | ICD-10-CM | POA: Insufficient documentation

## 2019-03-27 DIAGNOSIS — K921 Melena: Secondary | ICD-10-CM | POA: Insufficient documentation

## 2019-03-27 DIAGNOSIS — Z885 Allergy status to narcotic agent status: Secondary | ICD-10-CM | POA: Diagnosis not present

## 2019-03-27 DIAGNOSIS — G252 Other specified forms of tremor: Secondary | ICD-10-CM | POA: Insufficient documentation

## 2019-03-27 DIAGNOSIS — K760 Fatty (change of) liver, not elsewhere classified: Secondary | ICD-10-CM | POA: Diagnosis not present

## 2019-03-27 DIAGNOSIS — R911 Solitary pulmonary nodule: Secondary | ICD-10-CM | POA: Diagnosis not present

## 2019-03-27 DIAGNOSIS — Z79899 Other long term (current) drug therapy: Secondary | ICD-10-CM | POA: Insufficient documentation

## 2019-03-27 DIAGNOSIS — Z803 Family history of malignant neoplasm of breast: Secondary | ICD-10-CM | POA: Insufficient documentation

## 2019-03-27 DIAGNOSIS — Z5111 Encounter for antineoplastic chemotherapy: Secondary | ICD-10-CM | POA: Insufficient documentation

## 2019-03-27 DIAGNOSIS — C8298 Follicular lymphoma, unspecified, lymph nodes of multiple sites: Secondary | ICD-10-CM | POA: Insufficient documentation

## 2019-03-27 DIAGNOSIS — R5383 Other fatigue: Secondary | ICD-10-CM | POA: Diagnosis not present

## 2019-03-27 DIAGNOSIS — Z801 Family history of malignant neoplasm of trachea, bronchus and lung: Secondary | ICD-10-CM | POA: Insufficient documentation

## 2019-03-27 NOTE — Progress Notes (Signed)
HEMATOLOGY/ONCOLOGY CLINIC NOTE  Date of Service: 03/27/2019  Patient Care Team: [name], MD as PCP - General (Internal Medicine)  CHIEF COMPLAINTS/PURPOSE OF CONSULTATION:  Newly diagnosed follicular lymphoma  HISTORY OF PRESENTING ILLNESS:   Meghan Alexander is a wonderful 63 y.o. female who has been referred to [place] by Dr Layne Benton for evaluation and management of possible lymphoproliferative disease.The pt reports that she is doing well overall.  The pt reports that she had an MRI earlier this month because of her back pain. She has had back pain for about 30 years, but in May, it got so bad that she could not move. The pain radiated around her hip and down to her knee. She could not even get in her car to go to the ER. She put herself on bedrest for 5 weeks which improved her symptoms. Now, her back pain is back to baseline.  Pt reported that, in the past 6 months, she has been moving her bowels 3-4x daily, which is much more than normal for her. Throughout the day, her stools get progressively looser. She went to the ER on 10/11/2018 for food poisoning from mayonnaise because she had blood in the stool and cramping. The symptoms began 2 hours after she ate the mayo. She was not placed on antibiotics or any other medications. No stool testing was done. Her symptoms resolved in 2 days.  She experienced night sweats up until 2006, and they returned 6 months ago. She does not take hormone replacements because of her FHx of breast cancer. She also reports some new fatigue, dry eyes, and headaches over the last 6 months. Denies unexpected weight loss. She has not been diagnosed with diabetes.  Of note prior to the patient's visit today, pt has had MRI lumbar spine wo contrast completed on 11/02/2018 with results revealing "Extensive retroperitoneal lymphadenopathy highly worrisome for neoplastic process such as lymphoproliferative disease. Spondylosis worst at L4-5 where there is narrowing  in the right subarticular recess predominantly due to a synovial cyst off the medial margin of the right facet with encroachment on the descending right L5 root. There is mild central canal narrowing overall at this Level."  Most recent lab results (10/11/2018) of CBC is as follows: all values are WNL except for Potassium at 3.3, glucose bld at 212. 10/11/2018 Urinalysis revealed hazy appearance 10/11/2018 Lipase at 29  On review of systems, pt reports back pain, 3-4 bowel movements daily, dry eyes, headaches, night sweats, and denies unexpected weight loss, mouth sores, belly pain, changes in urination trouble swallowing, fever, chills, and any other symptoms.   On PMHx the pt reports total hysterectomy at 38 years ago due to ovarian cysts, lymphoreticulosis, sleep apnea related surgery, breast augmentation with no removal of breast tissue On Social Hx the pt reports never smoking On Family Hx the pt reports breast cancer on both sides of the family.   INTERVAL HISTORY  Meghan Alexander is a 63 y.o. female presenting today for the management and evaluation of Retroperitoneal Lymphadenopathy. The patient's last visit with [place] was on 03/05/2019. The pt reports that she is doing well overall.  The pt reports that her back has continued to bother her. For this pt was placed on Meloxicam and is currently having PT twice per week. The pain has been better controlled due to these factors. She is having discomfort in the back of her throat, "where her tonsils used to be". This has been going on for about 3 weeks and  is a constant sensation. Pt was given a shot of Cortizone in her left knee due to knee pain caused by arthritis. Her night sweats have dimished significantly after beginning Effexor. She has continued to eat well but has not been sleeping as well. Pt continues to walk or ride her bike to stay active.  Of note since the patient's last visit, pt has had Lymph Node Biopsy [id number]) completed  on 03/08/2019 with results revealing "LYMPH NODE, NEEDLE CORE BIOPSY: - Follicular lymphoma (grade 1-2 of 3) with elevated proliferation rate."  Lab results (03/08/19) of CBC is as follows: all values are WNL.  On review of systems, pt reports throat discomfort, chronic back pain, eating well, mild sleeplessness and denies fevers, chills, night sweats, chest pain, SOB, bowel movement issues, problems passing urine abdominal pain and any other symptoms.   MEDICAL HISTORY:  Past Medical History:  Diagnosis Date   Allergy    year around allergies   Arthritis    fingers, knees, neck, back-osteoarthristis   Bronchitis    hx of   Complication of anesthesia    GERD (gastroesophageal reflux disease)    hx of reflux-went away with elevating HOB    Hypertension    Pneumonia    hx of    PONV (postoperative nausea and vomiting)     SURGICAL HISTORY: Past Surgical History:  Procedure Laterality Date   ABDOMINAL HYSTERECTOMY     ADENOIDECTOMY     cosmetic surgeries     ESOPHAGEAL MANOMETRY N/A 12/02/2015   Procedure: ESOPHAGEAL MANOMETRY (EM);  Surgeon: [name], MD;  Location: [facility];  Service: Endoscopy;  Laterality: N/A;   ESOPHAGOGASTRODUODENOSCOPY N/A 12/02/2015   Procedure: ESOPHAGOGASTRODUODENOSCOPY (EGD);  Surgeon: [name], MD;  Location: [facility];  Service: Endoscopy;  Laterality: N/A;   lymph node removal left leg     cat scratch surgery    THROAT SURGERY     sleep apnea surgery    TONSILLECTOMY      SOCIAL HISTORY: Social History   Socioeconomic History   Marital status: Married    Spouse name: Not on file   Number of children: Not on file   Years of education: Not on file   Highest education level: Not on file  Occupational History   Not on file  Social Needs   Financial resource strain: Not on file   Food insecurity    Worry: Not on file    Inability: Not on file   Transportation needs    Medical: Not on file     Non-medical: Not on file  Tobacco Use   Smoking status: Never Smoker   Smokeless tobacco: Never Used  Substance and Sexual Activity   Alcohol use: No    Alcohol/week: 0.0 standard drinks   Drug use: No   Sexual activity: Not on file  Lifestyle   Physical activity    Days per week: Not on file    Minutes per session: Not on file   Stress: Not on file  Relationships   Social connections    Talks on phone: Not on file    Gets together: Not on file    Attends religious service: Not on file    Active member of club or organization: Not on file    Attends meetings of clubs or organizations: Not on file    Relationship status: Not on file   Intimate partner violence    Fear of current or ex partner: Not on file  Emotionally abused: Not on file    Physically abused: Not on file    Forced sexual activity: Not on file  Other Topics Concern   Not on file  Social History Narrative   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 [employer]   Never smoker never drinker   No drugs   Multiple allergies   Lives at home with her husband of 8 years since 07-Aug-2006    FAMILY HISTORY: Family History  Problem Relation Age of Onset   Lung cancer Father        08-06-1980 died from it   Hernia Mother    Breast cancer Maternal Aunt        multiple    Breast cancer Maternal Grandmother    Breast cancer Paternal Grandmother    Breast cancer Cousin        cousins multiple    Colon cancer Neg Hx    Colon polyps Neg Hx    Rectal cancer Neg Hx    Stomach cancer Neg Hx     ALLERGIES:  is allergic to codeine; other; hydrocodone; latex; and oxycodone.  MEDICATIONS:  Current Outpatient Medications  Medication Sig Dispense Refill   Biotin 7500 MCG TABS Take 1 tablet by mouth daily.     diphenhydrAMINE (BENADRYL) 25 MG tablet Take 25 mg by mouth at bedtime.     Lactobacillus-Inulin (CULTURELLE DIGESTIVE HEALTH PO) Take 1 capsule by mouth at bedtime.      lisinopril-hydrochlorothiazide (PRINZIDE,ZESTORETIC) 10-12.5 MG per tablet Take 1 tablet by mouth daily.      Melatonin 10 MG CAPS Take 10 mg by mouth at bedtime.      meloxicam (MOBIC) 15 MG tablet Take 15 mg by mouth daily.     metoprolol succinate (TOPROL-XL) 50 MG 24 hr tablet Take 50 mg by mouth daily.     Multiple Vitamin (MULTIVITAMIN) tablet Take 1 tablet by mouth daily.     rosuvastatin (CRESTOR) 10 MG tablet Take 10 mg by mouth daily.     venlafaxine (EFFEXOR) 37.5 MG tablet Take 37.5 mg by mouth once.     No current facility-administered medications for this visit.     REVIEW OF SYSTEMS: A 10+ POINT REVIEW OF SYSTEMS WAS OBTAINED including neurology, dermatology, psychiatry, cardiac, respiratory, lymph, extremities, GI, GU, Musculoskeletal, constitutional, breasts, reproductive, HEENT.  All pertinent positives are noted in the HPI.  All others are negative.   PHYSICAL EXAMINATION: ECOG PERFORMANCE STATUS: 1 - Symptomatic but completely ambulatory  . Vitals:   03/27/19 0918  BP: (!) 165/80  Pulse: 73  Resp: 18  Temp: 97.8 F (36.6 C)  SpO2: 97%   Filed Weights   03/27/19 0918  Weight: 197 lb 12.8 oz (89.7 kg)   .Body mass index is 36.18 kg/m.  GENERAL:alert, in no acute distress and comfortable SKIN: no acute rashes, no significant lesions EYES: conjunctiva are pink and non-injected, sclera anicteric OROPHARYNX: MMM, no exudates, no oropharyngeal erythema or ulceration NECK: supple, no JVD LYMPH:  no palpable lymphadenopathy in the cervical, axillary or inguinal regions LUNGS: clear to auscultation b/l with normal respiratory effort HEART: regular rate & rhythm ABDOMEN:  normoactive bowel sounds , non tender, not distended. No palpable hepatosplenomegaly.  Extremity: no pedal edema PSYCH: alert & oriented x 3 with fluent speech NEURO: no focal motor/sensory deficits  LABORATORY DATA:  I have reviewed the data as listed  . CBC Latest Ref Rng & Units  03/08/2019 03/05/2019 12/18/2018  WBC 4.0 -  10.5 K/uL 8.2 8.1 8.5  Hemoglobin 12.0 - 15.0 g/dL 14.0 13.9 14.4  Hematocrit 36.0 - 46.0 % 40.5 40.0 41.6  Platelets 150 - 400 K/uL 184 181 200    . CMP Latest Ref Rng & Units 03/05/2019 11/20/2018 10/11/2018  Glucose 70 - 99 mg/dL 125(H) 119(H) 212(H)  BUN 8 - 23 mg/dL 14 9 13   Creatinine 0.44 - 1.00 mg/dL 0.83 0.75 0.70  Sodium 135 - 145 mmol/L 141 139 139  Potassium 3.5 - 5.1 mmol/L 3.9 3.9 3.3(L)  Chloride 98 - 111 mmol/L 102 103 103  CO2 22 - 32 mmol/L 28 25 25   Calcium 8.9 - 10.3 mg/dL 8.8(L) 9.9 9.5  Total Protein 6.5 - 8.1 g/dL 6.8 7.5 6.9  Total Bilirubin 0.3 - 1.2 mg/dL 0.4 0.4 0.7  Alkaline Phos 38 - 126 U/L 60 68 61  AST 15 - 41 U/L 24 23 25   ALT 0 - 44 U/L 37 34 36   . Lab Results  Component Value Date   LDH 186 03/05/2019   03/08/2019 Lymph Node Biopsy [id number]):      RADIOGRAPHIC STUDIES: I have personally reviewed the radiological images as listed and agreed with the findings in the report. Ct Biopsy  Result Date: 03/08/2019 INDICATION: History of apparent low-grade follicular lymphoma diagnosed in CT-guided retroperitoneal lymph node biopsy performed 12/18/2018, though subsequent PET-CT demonstrated greater than expected hypermetabolic activity within the retroperitoneal and pelvic lymphadenopathy worrisome for high-grade transformation. As such, request made for repeat ultrasound-guided lymph node biopsy for tissue diagnostic purposes PET-CT was discussed with Dr. Irene Limbo prior to proceeding with the biopsy, and the decision was made to pursue repeat retroperitoneal biopsy as the retroperitoneal lymph nodes demonstrates similar metabolic to the dominant right common iliac chain nodal conglomeration with decreased risk of vascular injury. EXAM: CT BIOPSY COMPARISON:  PET-CT-02/06/2019; CT-guided retroperitoneal lymph node biopsy-12/18/2018 MEDICATIONS: None. ANESTHESIA/SEDATION: Fentanyl 50 mcg IV; Versed 1 mg IV  Sedation time: 20 minutes; The patient was continuously monitored during the procedure by the interventional radiology nurse under my direct supervision. CONTRAST:  None. COMPLICATIONS: None immediate. PROCEDURE: Informed consent was obtained from the patient following an explanation of the procedure, risks, benefits and alternatives. A time out was performed prior to the initiation of the procedure. The patient was positioned prone on the CT table and a limited CT was performed for procedural planning demonstrating grossly unchanged size and appearance of an approximately 2.6 x 2.0 cm left-sided retroperitoneal nodal conglomeration (image 62, series 3), previously noted to be hypermetabolic on preceding PET-CT. The procedure was planned. The operative site was prepped and draped in the usual sterile fashion. Appropriate trajectory was confirmed with a 22 gauge spinal needle after the adjacent tissues were anesthetized with 1% Lidocaine with epinephrine. Under intermittent CT guidance, a 17 gauge coaxial needle was advanced into the peripheral aspect of the retroperitoneal lymph node. Appropriate positioning was confirmed and 10 core needle biopsy samples were obtained with an 18 gauge core needle biopsy device. The co-axial needle was removed following the administration of a Gel-Foam slurry and superficial hemostasis was achieved with manual compression. A limited postprocedural CT was negative for hemorrhage or additional complication. A dressing was placed. The patient tolerated the procedure well without immediate postprocedural complication. IMPRESSION: Technically successful CT guided core needle biopsy of retroperitoneal nodal conglomeration. Electronically Signed   By: Sandi Mariscal M.D.   On: 03/08/2019 15:15    ASSESSMENT & PLAN:   #1 Retroperitoneal lymphadenopathy -  Newly Diagnosed  follicular lymphoma - likely low grade but accurate grading difficult  11/02/2018 MRI lumbar spine wo contrast  revealed "Extensive retroperitoneal lymphadenopathy highly worrisome for neoplastic process such as lymphoproliferative disease. Spondylosis worst at L4-5 where there is narrowing in the right subarticular recess predominantly due to a synovial cyst off the medial margin of the right facet with encroachment on the descending right L5 root. There is mild central canal narrowing overall at this Level."  12/04/2018 CT CAP revealed "Mild-to-moderate abdominal and pelvic lymphadenopathy and new 9 mm left superior mediastinal lymph node, highly suspicious for lymphoproliferative disorder. Stable 8 mm lingular pulmonary nodule, consistent with benign Etiology. Moderate hepatic steatosis. 1.3 cm indeterminate low-attenuation lesion in the right lobe. Recommend continued attention on follow-up CT."  12/18/2018 lymph node biopsy pathology revealing a follicular lymphoma- likely low grade but accurate grading not possible on limited sampling.  02/06/2019 PET/CT scan ([id number]) revealed "1. Bulky hypermetabolic lymphadenopathy in the abdomen and upper pelvis, as described. This is associated with mild hypermetabolic lymphadenopathy in the upper mediastinum and right axilla. Hypermetabolic lymphadenopathy represents a combination of Deauville 4 and Deauville 5 disease. 2. 8 mm nodule in the lingula without hypermetabolism. Attention on follow-up recommended. 3. Hepatic steatosis. 4.  Aortic Atherosclerois (ICD10-170.0)."   PLAN: -Discussed pt labwork, 03/08/19; blood counts are WNL -Discussed 03/08/2019 Lymph Node Biopsy ([id number]) which revealed "LYMPH NODE, NEEDLE CORE BIOPSY: - Follicular lymphoma (grade 1-2 of 3) with elevated proliferation rate." -Discussed that there are several highly active areas seen on her latest PET/CT scan -Advised pt that she has a low-grade Follicular Lymphoma based on labs but that it does appear to have some high-grade features -Advised pt that we would move to treat if  lymphoma is causing: constitutional symptoms, cytopenias, bulky disease or is bothersome -Pt is not currently experiencing constitutional symptoms, has botherline bulky disease and does not have any cytopenias  -No palpable lymphadenopathy upon examination -Discussed beginning treatment now vs. watching with labs and scans -Pt would like to begin treatment as soon as possible -Plan to use Bendamustine + Rituxan (chemotherapy + immunotherapy)  -Advised pt that each cycle is 2 days every 4 weeks  -Plan for 6 cycles of treatment, at least 2 cycles after complete response  -Plan to repeat PET/CT scan after 3 cycles -Will begin Bendamustine + Rituxan regimen in 1 week  -Will set up chemotherapy counseling as soon as possible -Will see back 10 days post C1 of treatment for a toxicity check   FOLLOW UP: Chemo-counseling for Bendamsutine/Rituxan ASAP Schedule to start D1 and D2 Bendamsutine/Rituxan in 1 week with labs RTC with Dr Irene Limbo with labs 10 days post C1 of BR for toxicity check  The total time spent in the appt was 25 minutes and more than 50% was on counseling and direct patient cares.  All of the patient's questions were answered with apparent satisfaction. The patient knows to call the clinic with any problems, questions or concerns.Sullivan Lone MD [place] AAHIVMS [facility] Hematology/Oncology Physician [facility]  (Office):       [phone] (Work cell):  [phone] (Fax):           [phone]  03/27/2019 10:55 AM  I, Yevette Edwards, am acting as a scribe for Dr. Sullivan Lone.   .I have reviewed the above documentation for accuracy and completeness, and I agree with the above. Brunetta Genera MD

## 2019-03-28 ENCOUNTER — Telehealth: Payer: Self-pay | Admitting: Hematology

## 2019-03-28 NOTE — Telephone Encounter (Signed)
Scheduled appt per 12/2 los.  Spoke with pt and she is aware of the appt date and time.  Sent first treatment for 12/10 as an add-on

## 2019-04-01 ENCOUNTER — Inpatient Hospital Stay: Payer: Medicare Other

## 2019-04-02 ENCOUNTER — Encounter: Payer: Self-pay | Admitting: Hematology

## 2019-04-02 ENCOUNTER — Other Ambulatory Visit: Payer: Self-pay | Admitting: Hematology

## 2019-04-02 DIAGNOSIS — C8218 Follicular lymphoma grade II, lymph nodes of multiple sites: Secondary | ICD-10-CM

## 2019-04-02 DIAGNOSIS — Z7189 Other specified counseling: Secondary | ICD-10-CM

## 2019-04-02 HISTORY — DX: Follicular lymphoma grade ii, lymph nodes of multiple sites: C82.18

## 2019-04-02 NOTE — Progress Notes (Signed)
START ON PATHWAY REGIMEN - Lymphoma and CLL     A cycle is every 28 days:     Bendamustine      Rituximab-xxxx   **Always confirm dose/schedule in your pharmacy ordering system**  Patient Characteristics: Follicular Lymphoma, Grades 1, 2, and 3A, First Line, Stage III / IV, Symptomatic or Bulky Disease Disease Type: Follicular Lymphoma, Grade 1, 2, or 3A Disease Type: Not Applicable Disease Type: Not Applicable [place] Stage: IV Line of Therapy: First Line Disease Characteristics: Symptomatic or Bulky Disease Intent of Therapy: Non-Curative / Palliative Intent, Discussed with Patient

## 2019-04-04 ENCOUNTER — Inpatient Hospital Stay: Payer: Medicare Other

## 2019-04-04 ENCOUNTER — Other Ambulatory Visit: Payer: Self-pay | Admitting: Hematology

## 2019-04-04 ENCOUNTER — Inpatient Hospital Stay (HOSPITAL_BASED_OUTPATIENT_CLINIC_OR_DEPARTMENT_OTHER): Payer: Medicare Other | Admitting: Medical

## 2019-04-04 ENCOUNTER — Encounter: Payer: Self-pay | Admitting: Hematology

## 2019-04-04 ENCOUNTER — Other Ambulatory Visit: Payer: Self-pay

## 2019-04-04 VITALS — BP 132/76 | HR 81 | Temp 98.5°F | Resp 18 | Wt 193.2 lb

## 2019-04-04 DIAGNOSIS — C8218 Follicular lymphoma grade II, lymph nodes of multiple sites: Secondary | ICD-10-CM

## 2019-04-04 DIAGNOSIS — T8090XA Unspecified complication following infusion and therapeutic injection, initial encounter: Secondary | ICD-10-CM

## 2019-04-04 DIAGNOSIS — Z7189 Other specified counseling: Secondary | ICD-10-CM

## 2019-04-04 DIAGNOSIS — Z5112 Encounter for antineoplastic immunotherapy: Secondary | ICD-10-CM | POA: Diagnosis not present

## 2019-04-04 LAB — CBC WITH DIFFERENTIAL/PLATELET
Abs Immature Granulocytes: 0.18 10*3/uL — ABNORMAL HIGH (ref 0.00–0.07)
Basophils Absolute: 0.1 10*3/uL (ref 0.0–0.1)
Basophils Relative: 1 %
Eosinophils Absolute: 0 10*3/uL (ref 0.0–0.5)
Eosinophils Relative: 0 %
HCT: 44.1 % (ref 36.0–46.0)
Hemoglobin: 15.3 g/dL — ABNORMAL HIGH (ref 12.0–15.0)
Immature Granulocytes: 1 %
Lymphocytes Relative: 16 %
Lymphs Abs: 2.2 10*3/uL (ref 0.7–4.0)
MCH: 30.5 pg (ref 26.0–34.0)
MCHC: 34.7 g/dL (ref 30.0–36.0)
MCV: 87.8 fL (ref 80.0–100.0)
Monocytes Absolute: 0.8 10*3/uL (ref 0.1–1.0)
Monocytes Relative: 5 %
Neutro Abs: 10.9 10*3/uL — ABNORMAL HIGH (ref 1.7–7.7)
Neutrophils Relative %: 77 %
Platelets: 238 10*3/uL (ref 150–400)
RBC: 5.02 MIL/uL (ref 3.87–5.11)
RDW: 11.9 % (ref 11.5–15.5)
WBC: 14.2 10*3/uL — ABNORMAL HIGH (ref 4.0–10.5)
nRBC: 0 % (ref 0.0–0.2)

## 2019-04-04 LAB — CMP (CANCER CENTER ONLY)
ALT: 49 U/L — ABNORMAL HIGH (ref 0–44)
AST: 18 U/L (ref 15–41)
Albumin: 4.3 g/dL (ref 3.5–5.0)
Alkaline Phosphatase: 65 U/L (ref 38–126)
Anion gap: 14 (ref 5–15)
BUN: 24 mg/dL — ABNORMAL HIGH (ref 8–23)
CO2: 25 mmol/L (ref 22–32)
Calcium: 9.2 mg/dL (ref 8.9–10.3)
Chloride: 100 mmol/L (ref 98–111)
Creatinine: 0.97 mg/dL (ref 0.44–1.00)
GFR, Est AFR Am: >60 mL/min (ref >60)
GFR, Estimated: >60 mL/min (ref >60)
Glucose, Bld: 252 mg/dL — ABNORMAL HIGH (ref 70–99)
Potassium: 4.5 mmol/L (ref 3.5–5.1)
Sodium: 139 mmol/L (ref 135–145)
Total Bilirubin: 0.6 mg/dL (ref 0.3–1.2)
Total Protein: 7.3 g/dL (ref 6.5–8.1)

## 2019-04-04 LAB — LACTATE DEHYDROGENASE: LDH: 237 U/L — ABNORMAL HIGH (ref 98–192)

## 2019-04-04 LAB — URIC ACID: Uric Acid, Serum: 4.3 mg/dL (ref 2.5–7.1)

## 2019-04-04 MED ORDER — ACETAMINOPHEN 325 MG PO TABS
650.0000 mg | ORAL_TABLET | Freq: Once | ORAL | Status: AC
  Administered 2019-04-04: 650 mg via ORAL

## 2019-04-04 MED ORDER — DIPHENHYDRAMINE HCL 25 MG PO CAPS
50.0000 mg | ORAL_CAPSULE | Freq: Once | ORAL | Status: AC
  Administered 2019-04-04: 50 mg via ORAL

## 2019-04-04 MED ORDER — DIPHENHYDRAMINE HCL 25 MG PO CAPS
ORAL_CAPSULE | ORAL | Status: AC
  Filled 2019-04-04: qty 2

## 2019-04-04 MED ORDER — PALONOSETRON HCL INJECTION 0.25 MG/5ML
INTRAVENOUS | Status: AC
  Filled 2019-04-04: qty 5

## 2019-04-04 MED ORDER — FAMOTIDINE IN NACL 20-0.9 MG/50ML-% IV SOLN
20.0000 mg | Freq: Once | INTRAVENOUS | Status: AC | PRN
  Administered 2019-04-04: 20 mg via INTRAVENOUS

## 2019-04-04 MED ORDER — SODIUM CHLORIDE 0.9 % IV SOLN
90.0000 mg/m2 | Freq: Once | INTRAVENOUS | Status: AC
  Administered 2019-04-04: 175 mg via INTRAVENOUS
  Filled 2019-04-04: qty 7

## 2019-04-04 MED ORDER — SODIUM CHLORIDE 0.9 % IV SOLN: 10.0000 mg | Freq: Once | INTRAVENOUS | Status: DC

## 2019-04-04 MED ORDER — DEXAMETHASONE SODIUM PHOSPHATE 10 MG/ML IJ SOLN
INTRAMUSCULAR | Status: AC
  Filled 2019-04-04: qty 1

## 2019-04-04 MED ORDER — DEXAMETHASONE SODIUM PHOSPHATE 10 MG/ML IJ SOLN
10.0000 mg | Freq: Once | INTRAMUSCULAR | Status: AC
  Administered 2019-04-04: 10 mg via INTRAVENOUS

## 2019-04-04 MED ORDER — SODIUM CHLORIDE 0.9 % IV SOLN
Freq: Once | INTRAVENOUS | Status: AC
  Administered 2019-04-04: 09:00:00 via INTRAVENOUS
  Filled 2019-04-04: qty 250

## 2019-04-04 MED ORDER — PALONOSETRON HCL INJECTION 0.25 MG/5ML
0.2500 mg | Freq: Once | INTRAVENOUS | Status: AC
  Administered 2019-04-04: 0.25 mg via INTRAVENOUS

## 2019-04-04 MED ORDER — ACETAMINOPHEN 325 MG PO TABS
ORAL_TABLET | ORAL | Status: AC
  Filled 2019-04-04: qty 2

## 2019-04-04 MED ORDER — SODIUM CHLORIDE 0.9 % IV SOLN
375.0000 mg/m2 | Freq: Once | INTRAVENOUS | Status: AC
  Administered 2019-04-04: 700 mg via INTRAVENOUS
  Filled 2019-04-04: qty 50

## 2019-04-04 NOTE — Patient Instructions (Signed)
Rituximab injection What is this medicine? RITUXIMAB (ri TUX i mab) is a monoclonal antibody. It is used to treat certain types of cancer like non-Hodgkin lymphoma and chronic lymphocytic leukemia. It is also used to treat rheumatoid arthritis, granulomatosis with polyangiitis (or Wegener's granulomatosis), microscopic polyangiitis, and pemphigus vulgaris. This medicine may be used for other purposes; ask your health care provider or pharmacist if you have questions. COMMON BRAND NAME(S): Rituxan, RUXIENCE What should I tell my health care provider before I take this medicine? They need to know if you have any of these conditions:  heart disease  infection (especially a virus infection such as hepatitis B, chickenpox, cold sores, or herpes)  immune system problems  irregular heartbeat  kidney disease  low blood counts, like low white cell, platelet, or red cell counts  lung or breathing disease, like asthma  recently received or scheduled to receive a vaccine  an unusual or allergic reaction to rituximab, other medicines, foods, dyes, or preservatives  pregnant or trying to get pregnant  breast-feeding How should I use this medicine? This medicine is for infusion into a vein. It is administered in a hospital or clinic by a specially trained health care professional. A special MedGuide will be given to you by the pharmacist with each prescription and refill. Be sure to read this information carefully each time. Talk to your pediatrician regarding the use of this medicine in children. This medicine is not approved for use in children. Overdosage: If you think you have taken too much of this medicine contact a poison control center or emergency room at once. NOTE: This medicine is only for you. Do not share this medicine with others. What if I miss a dose? It is important not to miss a dose. Call your doctor or health care professional if you are unable to keep an appointment. What  may interact with this medicine?  cisplatin  live virus vaccines This list may not describe all possible interactions. Give your health care provider a list of all the medicines, herbs, non-prescription drugs, or dietary supplements you use. Also tell them if you smoke, drink alcohol, or use illegal drugs. Some items may interact with your medicine. What should I watch for while using this medicine? Your condition will be monitored carefully while you are receiving this medicine. You may need blood work done while you are taking this medicine. This medicine can cause serious allergic reactions. To reduce your risk you may need to take medicine before treatment with this medicine. Take your medicine as directed. In some patients, this medicine may cause a serious brain infection that may cause death. If you have any problems seeing, thinking, speaking, walking, or standing, tell your healthcare professional right away. If you cannot reach your healthcare professional, urgently seek other source of medical care. Call your doctor or health care professional for advice if you get a fever, chills or sore throat, or other symptoms of a cold or flu. Do not treat yourself. This drug decreases your body's ability to fight infections. Try to avoid being around people who are sick. Do not become pregnant while taking this medicine or for at least 12 months after stopping it. Women should inform their doctor if they wish to become pregnant or think they might be pregnant. There is a potential for serious side effects to an unborn child. Talk to your health care professional or pharmacist for more information. Do not breast-feed an infant while taking this medicine or for at   least 6 months after stopping it. What side effects may I notice from receiving this medicine? Side effects that you should report to your doctor or health care professional as soon as possible:  allergic reactions like skin rash, itching or  hives; swelling of the face, lips, or tongue  breathing problems  chest pain  changes in vision  diarrhea  headache with fever, neck stiffness, sensitivity to light, nausea, or confusion  fast, irregular heartbeat  loss of memory  low blood counts - this medicine may decrease the number of white blood cells, red blood cells and platelets. You may be at increased risk for infections and bleeding.  mouth sores  problems with balance, talking, or walking  redness, blistering, peeling or loosening of the skin, including inside the mouth  signs of infection - fever or chills, cough, sore throat, pain or difficulty passing urine  signs and symptoms of kidney injury like trouble passing urine or change in the amount of urine  signs and symptoms of liver injury like dark yellow or brown urine; general ill feeling or flu-like symptoms; light-colored stools; loss of appetite; nausea; right upper belly pain; unusually weak or tired; yellowing of the eyes or skin  signs and symptoms of low blood pressure like dizziness; feeling faint or lightheaded, falls; unusually weak or tired  stomach pain  swelling of the ankles, feet, hands  unusual bleeding or bruising  vomiting Side effects that usually do not require medical attention (report to your doctor or health care professional if they continue or are bothersome):  headache  joint pain  muscle cramps or muscle pain  nausea  tiredness This list may not describe all possible side effects. Call your doctor for medical advice about side effects. You may report side effects to FDA at 1-800-FDA-1088. Where should I keep my medicine? This drug is given in a hospital or clinic and will not be stored at home. NOTE: This sheet is a summary. It may not cover all possible information. If you have questions about this medicine, talk to your doctor, pharmacist, or health care provider.  2020 Elsevier/Gold Standard (2018-05-23  22:01:36) Bendamustine Injection What is this medicine? BENDAMUSTINE (BEN da MUS teen) is a chemotherapy drug. It is used to treat chronic lymphocytic leukemia and non-Hodgkin lymphoma. This medicine may be used for other purposes; ask your health care provider or pharmacist if you have questions. COMMON BRAND NAME(S): BELRAPZO, BENDEKA, Treanda What should I tell my health care provider before I take this medicine? They need to know if you have any of these conditions:  infection (especially a virus infection such as chickenpox, cold sores, or herpes)  kidney disease  liver disease  an unusual or allergic reaction to bendamustine, mannitol, other medicines, foods, dyes, or preservatives  pregnant or trying to get pregnant  breast-feeding How should I use this medicine? This medicine is for infusion into a vein. It is given by a health care professional in a hospital or clinic setting. Talk to your pediatrician regarding the use of this medicine in children. Special care may be needed. Overdosage: If you think you have taken too much of this medicine contact a poison control center or emergency room at once. NOTE: This medicine is only for you. Do not share this medicine with others. What if I miss a dose? It is important not to miss your dose. Call your doctor or health care professional if you are unable to keep an appointment. What may interact with this   medicine? Do not take this medicine with any of the following medications:  clozapine This medicine may also interact with the following medications:  atazanavir  cimetidine  ciprofloxacin  enoxacin  fluvoxamine  medicines for seizures like carbamazepine and phenobarbital  mexiletine  rifampin  tacrine  thiabendazole  zileuton This list may not describe all possible interactions. Give your health care provider a list of all the medicines, herbs, non-prescription drugs, or dietary supplements you use. Also tell  them if you smoke, drink alcohol, or use illegal drugs. Some items may interact with your medicine. What should I watch for while using this medicine? This drug may make you feel generally unwell. This is not uncommon, as chemotherapy can affect healthy cells as well as cancer cells. Report any side effects. Continue your course of treatment even though you feel ill unless your doctor tells you to stop. You may need blood work done while you are taking this medicine. Call your doctor or healthcare provider for advice if you get a fever, chills or sore throat, or other symptoms of a cold or flu. Do not treat yourself. This drug decreases your body's ability to fight infections. Try to avoid being around people who are sick. This medicine may cause serious skin reactions. They can happen weeks to months after starting the medicine. Contact your healthcare provider right away if you notice fevers or flu-like symptoms with a rash. The rash may be red or purple and then turn into blisters or peeling of the skin. Or, you might notice a red rash with swelling of the face, lips or lymph nodes in your neck or under your arms. This medicine may increase your risk to bruise or bleed. Call your doctor or healthcare provider if you notice any unusual bleeding. Talk to your doctor about your risk of cancer. You may be more at risk for certain types of cancers if you take this medicine. Do not become pregnant while taking this medicine or for at least 6 months after stopping it. Women should inform their doctor if they wish to become pregnant or think they might be pregnant. Men should not father a child while taking this medicine and for at least 3 months after stopping it. There is a potential for serious side effects to an unborn child. Talk to your healthcare provider or pharmacist for more information. Do not breast-feed an infant while taking this medicine or for at least 1 week after stopping it. This medicine may  make it more difficult to father a child. You should talk with your doctor or healthcare provider if you are concerned about your fertility. What side effects may I notice from receiving this medicine? Side effects that you should report to your doctor or health care professional as soon as possible:  allergic reactions like skin rash, itching or hives, swelling of the face, lips, or tongue  low blood counts - this medicine may decrease the number of white blood cells, red blood cells and platelets. You may be at increased risk for infections and bleeding.  rash, fever, and swollen lymph nodes  redness, blistering, peeling, or loosening of the skin, including inside the mouth  signs of infection like fever or chills, cough, sore throat, pain or difficulty passing urine  signs of decreased platelets or bleeding like bruising, pinpoint red spots on the skin, black, tarry stools, blood in the urine  signs of decreased red blood cells like being unusually weak or tired, fainting spells, lightheadedness  signs   and symptoms of kidney injury like trouble passing urine or change in the amount of urine  signs and symptoms of liver injury like dark yellow or brown urine; general ill feeling or flu-like symptoms; light-colored stools; loss of appetite; nausea; right upper belly pain; unusually weak or tired; yellowing of the eyes or skin Side effects that usually do not require medical attention (report to your doctor or health care professional if they continue or are bothersome):  constipation  decreased appetite  diarrhea  headache  mouth sores  nausea, vomiting  tiredness This list may not describe all possible side effects. Call your doctor for medical advice about side effects. You may report side effects to FDA at 1-800-FDA-1088. Where should I keep my medicine? This drug is given in a hospital or clinic and will not be stored at home. NOTE: This sheet is a summary. It may not  cover all possible information. If you have questions about this medicine, talk to your doctor, pharmacist, or health care provider.  2020 Elsevier/Gold Standard (2018-07-03 10:26:46)  

## 2019-04-04 NOTE — Progress Notes (Signed)
During first 15 minutes of first infusion of rituximab, patient became suddenly nauseous, hot and flushed. RN stopped infusion, and hung NS wide open. Lucianne Lei, PA to chairside. Orders given for 20mg  pepcid IV. After receiving all of pepcid and 222ml NS, patient feeling better and able to re-start infusion. Patient tolerated the rest of the rituximab infusion without any further complications.

## 2019-04-04 NOTE — Progress Notes (Signed)
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  Unfortunately there aren't any foundations offering copay assistance for her Dx and the type of ins she has.  I offered the [employer], went over what it covers and gave her the income requirement.  She stated she exceeds the income requirement so she doesn't qualify for the grant at this time.  I gave her my card for any questions or concerns she may have in the future.

## 2019-04-05 ENCOUNTER — Other Ambulatory Visit: Payer: Self-pay

## 2019-04-05 ENCOUNTER — Ambulatory Visit: Payer: Medicare Other

## 2019-04-05 ENCOUNTER — Inpatient Hospital Stay: Payer: Medicare Other

## 2019-04-05 ENCOUNTER — Other Ambulatory Visit: Payer: Medicare Other

## 2019-04-05 VITALS — BP 154/76 | HR 84 | Temp 98.6°F | Resp 16

## 2019-04-05 DIAGNOSIS — Z7189 Other specified counseling: Secondary | ICD-10-CM

## 2019-04-05 DIAGNOSIS — C8218 Follicular lymphoma grade II, lymph nodes of multiple sites: Secondary | ICD-10-CM

## 2019-04-05 DIAGNOSIS — Z5112 Encounter for antineoplastic immunotherapy: Secondary | ICD-10-CM | POA: Diagnosis not present

## 2019-04-05 MED ORDER — SODIUM CHLORIDE 0.9 % IV SOLN
Freq: Once | INTRAVENOUS | Status: AC
  Administered 2019-04-05: 09:00:00 via INTRAVENOUS
  Filled 2019-04-05: qty 250

## 2019-04-05 MED ORDER — SODIUM CHLORIDE 0.9 % IV SOLN: 10.0000 mg | Freq: Once | INTRAVENOUS | Status: DC

## 2019-04-05 MED ORDER — DEXAMETHASONE SODIUM PHOSPHATE 10 MG/ML IJ SOLN
10.0000 mg | Freq: Once | INTRAMUSCULAR | Status: AC
  Administered 2019-04-05: 09:00:00 10 mg via INTRAVENOUS

## 2019-04-05 MED ORDER — SODIUM CHLORIDE 0.9 % IV SOLN
90.0000 mg/m2 | Freq: Once | INTRAVENOUS | Status: AC
  Administered 2019-04-05: 175 mg via INTRAVENOUS
  Filled 2019-04-05: qty 7

## 2019-04-05 MED ORDER — DEXAMETHASONE SODIUM PHOSPHATE 10 MG/ML IJ SOLN
INTRAMUSCULAR | Status: AC
  Filled 2019-04-05: qty 1

## 2019-04-05 NOTE — Progress Notes (Signed)
    DATE:  04/04/2019                                          X  CHEMO/IMMUNOTHERAPY REACTION           MD:  Dr. Sullivan Lone   AGENT/BLOOD [place]:               Ruxience and Bendeka   AGENT/BLOOD PRODUCT RECEIVING IMMEDIATELY PRIOR TO REACTION:           Ruxience   VS: BP:      135/81   P:        97          REACTION(S):            Hot flashes and nausea   PREMEDS:      [place] Tylenol 650 mg p.o. x1, Benadryl 50 mg IV x1 and dexamethasone 10 mg IV x1   INTERVENTION: Pepcid 20 mg IV x1   Review of Systems  Review of Systems  Constitutional: Negative for chills, diaphoresis and fever.       Hot flashes  HENT: Negative for trouble swallowing and voice change.   Respiratory: Negative for cough, chest tightness, shortness of breath and wheezing.   Cardiovascular: Negative for chest pain and palpitations.  Gastrointestinal: Positive for nausea. Negative for abdominal pain, constipation, diarrhea and vomiting.  Musculoskeletal: Negative for back pain and myalgias.  Neurological: Negative for dizziness, light-headedness and headaches.     Physical Exam  Physical Exam Constitutional:      General: She is not in acute distress.    Appearance: She is not diaphoretic.  HENT:     Head: Normocephalic and atraumatic.  Eyes:     General: No scleral icterus.       Right eye: No discharge.        Left eye: No discharge.  Cardiovascular:     Rate and Rhythm: Normal rate and regular rhythm.     Heart sounds: Normal heart sounds. No murmur. No friction rub. No gallop.   Pulmonary:     Effort: Pulmonary effort is normal. No respiratory distress.     Breath sounds: Normal breath sounds. No wheezing or rales.  Skin:    General: Skin is warm and dry.     Findings: No erythema or rash.  Neurological:     Mental Status: She is alert.  Psychiatric:        Mood and Affect: Mood normal.        Behavior: Behavior normal.        Thought Content: Thought content normal.        Judgment: Judgment normal.     OUTCOME:                 Ruxience was paused.  The patient's symptoms abated quickly after receiving Pepcid 20 mg IV x1.  Her therapy was restarted and completed without any further issues of concern.   Sandi Mealy, MHS, PA-C

## 2019-04-08 ENCOUNTER — Other Ambulatory Visit: Payer: Self-pay | Admitting: Hematology

## 2019-04-08 ENCOUNTER — Telehealth: Payer: Self-pay | Admitting: *Deleted

## 2019-04-08 DIAGNOSIS — C8218 Follicular lymphoma grade II, lymph nodes of multiple sites: Secondary | ICD-10-CM

## 2019-04-08 MED ORDER — DEXAMETHASONE 4 MG PO TABS: ORAL_TABLET | ORAL | 0 refills | Status: DC

## 2019-04-08 MED ORDER — ACYCLOVIR 400 MG PO TABS: 400.0000 mg | ORAL_TABLET | Freq: Every day | ORAL | 6 refills | Status: DC

## 2019-04-08 MED ORDER — ONDANSETRON HCL 8 MG PO TABS: 8.0000 mg | ORAL_TABLET | Freq: Three times a day (TID) | ORAL | 3 refills | Status: DC | PRN

## 2019-04-08 MED ORDER — LORAZEPAM 0.5 MG PO TABS: 0.5000 mg | ORAL_TABLET | Freq: Three times a day (TID) | ORAL | 0 refills | Status: DC

## 2019-04-08 MED ORDER — PROCHLORPERAZINE MALEATE 10 MG PO TABS: 10.0000 mg | ORAL_TABLET | Freq: Four times a day (QID) | ORAL | 0 refills | Status: DC | PRN

## 2019-04-08 NOTE — Telephone Encounter (Signed)
Patient was contacted by RN for chemo follow up - received Bendeka/Rituxan on 12/11. RN noted patient was experiencing nausea/dizziness and feeling weak since chemo. States she is staying hydrated and drinking fluids.  Reviewed patient chart with Dr. Irene Limbo. Per Dr.Kale, he sent medications to patient's pharmacy last week - per Epic - medications not received by pharmacy. He resent all medications today. .  Instructed patient per Dr. Irene Limbo: medications re-sent today.She can take her 3 days of dexamethasone as ordered starting today and may use lorazepam and prn zofran for nausea/dizziness. maintain h20 intake 48-64oz daily. Patient given information and instructions on medications. Encouraged patient to call office for any questions. Patient verbalized understanding.

## 2019-04-08 NOTE — Telephone Encounter (Signed)
Called pt to see how she did post treatment & she reports some nausea, dizziness, & weakness.  She doesn't have anything for nausea & has been taking tums.  She states that she is drinking well & staying hydrated but has lost @ 8 lbs.  Informed that a message would be sent to Dr Launa Flight RN to send her something for nausea.  She denies any other problems.

## 2019-04-11 ENCOUNTER — Other Ambulatory Visit: Payer: Medicare Other

## 2019-04-11 ENCOUNTER — Ambulatory Visit: Payer: Medicare Other | Admitting: Hematology

## 2019-04-12 ENCOUNTER — Other Ambulatory Visit: Payer: Self-pay | Admitting: *Deleted

## 2019-04-12 DIAGNOSIS — C8218 Follicular lymphoma grade II, lymph nodes of multiple sites: Secondary | ICD-10-CM

## 2019-04-15 ENCOUNTER — Inpatient Hospital Stay: Payer: Medicare Other

## 2019-04-15 ENCOUNTER — Other Ambulatory Visit: Payer: Self-pay

## 2019-04-15 ENCOUNTER — Inpatient Hospital Stay: Payer: Medicare Other | Admitting: Hematology

## 2019-04-15 VITALS — BP 128/87 | HR 88 | Temp 98.5°F | Resp 18 | Ht 62.0 in | Wt 190.9 lb

## 2019-04-15 DIAGNOSIS — C8218 Follicular lymphoma grade II, lymph nodes of multiple sites: Secondary | ICD-10-CM | POA: Diagnosis not present

## 2019-04-15 DIAGNOSIS — Z5112 Encounter for antineoplastic immunotherapy: Secondary | ICD-10-CM | POA: Diagnosis not present

## 2019-04-15 LAB — CBC WITH DIFFERENTIAL (CANCER CENTER ONLY)
Abs Immature Granulocytes: 0.12 10*3/uL — ABNORMAL HIGH (ref 0.00–0.07)
Basophils Absolute: 0 10*3/uL (ref 0.0–0.1)
Basophils Relative: 0 %
Eosinophils Absolute: 0.1 10*3/uL (ref 0.0–0.5)
Eosinophils Relative: 1 %
HCT: 45.1 % (ref 36.0–46.0)
Hemoglobin: 15.4 g/dL — ABNORMAL HIGH (ref 12.0–15.0)
Immature Granulocytes: 2 %
Lymphocytes Relative: 4 %
Lymphs Abs: 0.3 10*3/uL — ABNORMAL LOW (ref 0.7–4.0)
MCH: 30.6 pg (ref 26.0–34.0)
MCHC: 34.1 g/dL (ref 30.0–36.0)
MCV: 89.7 fL (ref 80.0–100.0)
Monocytes Absolute: 1 10*3/uL (ref 0.1–1.0)
Monocytes Relative: 15 %
Neutro Abs: 5.3 10*3/uL (ref 1.7–7.7)
Neutrophils Relative %: 78 %
Platelet Count: 197 10*3/uL (ref 150–400)
RBC: 5.03 MIL/uL (ref 3.87–5.11)
RDW: 11.9 % (ref 11.5–15.5)
WBC Count: 6.8 10*3/uL (ref 4.0–10.5)
nRBC: 0 % (ref 0.0–0.2)

## 2019-04-15 LAB — CMP (CANCER CENTER ONLY)
ALT: 50 U/L — ABNORMAL HIGH (ref 0–44)
AST: 21 U/L (ref 15–41)
Albumin: 4.1 g/dL (ref 3.5–5.0)
Alkaline Phosphatase: 51 U/L (ref 38–126)
Anion gap: 14 (ref 5–15)
BUN: 14 mg/dL (ref 8–23)
CO2: 26 mmol/L (ref 22–32)
Calcium: 9.6 mg/dL (ref 8.9–10.3)
Chloride: 97 mmol/L — ABNORMAL LOW (ref 98–111)
Creatinine: 0.85 mg/dL (ref 0.44–1.00)
GFR, Est AFR Am: >60 mL/min (ref >60)
GFR, Estimated: >60 mL/min (ref >60)
Glucose, Bld: 204 mg/dL — ABNORMAL HIGH (ref 70–99)
Potassium: 4.4 mmol/L (ref 3.5–5.1)
Sodium: 137 mmol/L (ref 135–145)
Total Bilirubin: 0.5 mg/dL (ref 0.3–1.2)
Total Protein: 6.9 g/dL (ref 6.5–8.1)

## 2019-04-15 LAB — LACTATE DEHYDROGENASE: LDH: 234 U/L — ABNORMAL HIGH (ref 98–192)

## 2019-04-15 LAB — URIC ACID: Uric Acid, Serum: 4.4 mg/dL (ref 2.5–7.1)

## 2019-04-15 MED ORDER — ONDANSETRON HCL 8 MG PO TABS: 8.0000 mg | ORAL_TABLET | Freq: Three times a day (TID) | ORAL | 3 refills | Status: DC | PRN

## 2019-04-15 MED ORDER — LORAZEPAM 0.5 MG PO TABS: 0.5000 mg | ORAL_TABLET | Freq: Three times a day (TID) | ORAL | 0 refills | Status: DC

## 2019-04-15 NOTE — Progress Notes (Signed)
HEMATOLOGY/ONCOLOGY CLINIC NOTE  Date of Service: 04/15/2019  Patient Care Team: [name], MD as PCP - General (Internal Medicine)  CHIEF COMPLAINTS/PURPOSE OF CONSULTATION:  Newly diagnosed follicular lymphoma  HISTORY OF PRESENTING ILLNESS:   Meghan Alexander is a wonderful 63 y.o. female who has been referred to [place] by Dr Layne Benton for evaluation and management of possible lymphoproliferative disease.The pt reports that she is doing well overall.  The pt reports that she had an MRI earlier this month because of her back pain. She has had back pain for about 30 years, but in May, it got so bad that she could not move. The pain radiated around her hip and down to her knee. She could not even get in her car to go to the ER. She put herself on bedrest for 5 weeks which improved her symptoms. Now, her back pain is back to baseline.  Pt reported that, in the past 6 months, she has been moving her bowels 3-4x daily, which is much more than normal for her. Throughout the day, her stools get progressively looser. She went to the ER on 10/11/2018 for food poisoning from mayonnaise because she had blood in the stool and cramping. The symptoms began 2 hours after she ate the mayo. She was not placed on antibiotics or any other medications. No stool testing was done. Her symptoms resolved in 2 days.  She experienced night sweats up until 2006, and they returned 6 months ago. She does not take hormone replacements because of her FHx of breast cancer. She also reports some new fatigue, dry eyes, and headaches over the last 6 months. Denies unexpected weight loss. She has not been diagnosed with diabetes.  Of note prior to the patient's visit today, pt has had MRI lumbar spine wo contrast completed on 11/02/2018 with results revealing "Extensive retroperitoneal lymphadenopathy highly worrisome for neoplastic process such as lymphoproliferative disease. 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 There is mild central canal narrowing overall at this Level."  Most recent lab results (10/11/2018) of CBC is as follows: all values are WNL except for Potassium at 3.3, glucose bld at 212. 10/11/2018 Urinalysis revealed hazy appearance 10/11/2018 Lipase at 29  On review of systems, pt reports back pain, 3-4 bowel movements daily, dry eyes, headaches, night sweats, and denies unexpected weight loss, mouth sores, belly pain, changes in urination trouble swallowing, fever, chills, and any other symptoms.   On PMHx the pt reports total hysterectomy at 38 years ago due to ovarian cysts, lymphoreticulosis, sleep apnea related surgery, breast augmentation with no removal of breast tissue On Social Hx the pt reports never smoking On Family Hx the pt reports breast cancer on both sides of the family.   INTERVAL HISTORY  Meghan Alexander is a 63 y.o. female presenting today for the management and evaluation of Retroperitoneal Lymphadenopathy. Pt is here for a toxicity check after C1D1 of Bendamustine + Rituxan. The patient's last visit with [place] was on 03/27/2019. The pt reports that she is doing well overall.  The pt reports that she is needing to use nausea medication everyday and she has noticed some abnormal looking, hard stools since her first treatment. She has only been taking Culturelle to assist with her bowel movements. The nausea has been persistent and is not accompanied by any acid  reflux. She has been taking Zofran once a day, in the mornings. Pt has been going to sleep earlier due to being fatigued and is usually able to sleep for 5 hours before waking up. She can typically fall back asleep and get another 3 hours of quality sleep but she is unsure of why she wakes up. Pt has stress-related tremors and there has never been any  concerns for essential tremors.   She is planning for a quiet Christmas with close family and is staying busy by continuing to instruct.   Lab results today (04/15/19) of CBC w/diff and CMP is as follows: all values are WNL except for Hgb at 15.4, Lymphs Abs at 0.3K, Abs Immature Granulocytes at 0.12K, Chloride at 97, Glucose at 204, ALT at 50. 04/15/2019 LDH at 234 04/15/2019 Uric acid at 4.4  On review of systems, pt reports nausea, abnormal looking/hard stools, fatigue and denies acid reflux, skin rashes, abdominal pain, leg swelling and any other symptoms.    MEDICAL HISTORY:  Past Medical History:  Diagnosis Date  . Allergy    year around allergies  . Arthritis    fingers, knees, neck, back-osteoarthristis  . Bronchitis    hx of  . Complication of anesthesia   . 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 ([place]) 04/02/2019  . GERD (gastroesophageal reflux disease)    hx of reflux-went away with elevating HOB   . Hypertension   . Pneumonia    hx of   . PONV (postoperative nausea and vomiting)     SURGICAL HISTORY: Past Surgical History:  Procedure Laterality Date  . ABDOMINAL HYSTERECTOMY    . ADENOIDECTOMY    . cosmetic surgeries    . ESOPHAGEAL MANOMETRY N/A 12/02/2015   Procedure: ESOPHAGEAL MANOMETRY (EM);  Surgeon: [name], MD;  Location: [facility];  Service: Endoscopy;  Laterality: N/A;  . ESOPHAGOGASTRODUODENOSCOPY N/A 12/02/2015   Procedure: ESOPHAGOGASTRODUODENOSCOPY (EGD);  Surgeon: [name], MD;  Location: [facility];  Service: Endoscopy;  Laterality: N/A;  . lymph node removal left leg     cat scratch surgery   . THROAT SURGERY     sleep apnea surgery   . TONSILLECTOMY      SOCIAL HISTORY: Social History   Socioeconomic History  . Marital status: Married    Spouse name: Not on file  . Number of children: Not on file  . Years of education: Not on file  . Highest education level: Not on file  Occupational History  .  Not on file  Tobacco Use  . Smoking status: Never Smoker  . Smokeless tobacco: Never Used  Substance and Sexual Activity  . Alcohol use: No    Alcohol/week: 0.0 standard drinks  . Drug use: No  . Sexual activity: Not on file  Other Topics Concern  . 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 Difficulty of Paying Living Expenses: Not on file  Food Insecurity:   . Worried About Charity fundraiser in the Last Year: Not on file  . Ran Out of Food in the Last Year: Not on file  Transportation Needs:   . Lack of Transportation (Medical): Not on file  .  Lack of Transportation (Non-Medical): Not on file  Physical Activity:   . Days of Exercise per Week: Not on file  . Minutes of Exercise per Session: Not on file  Stress:   . Feeling of Stress : Not on file  Social Connections:   . Frequency of Communication with Friends and Family: Not on file  . Frequency of Social Gatherings with Friends and Family: Not on file  . Attends Religious Services: Not on file  . Active Member of Clubs or Organizations: Not on file  . Attends Archivist Meetings: Not on file  . Marital Status: Not on file  Intimate Partner Violence:   . Fear of Current or Ex-Partner: Not on file  . Emotionally Abused: Not on file  . Physically Abused: Not on file  . Sexually Abused: Not on file    FAMILY HISTORY: Family History  Problem Relation Age of Onset  . Lung cancer Father        1982 died from it  . Hernia Mother   . Breast cancer Maternal Aunt        multiple   . Breast cancer Maternal Grandmother   . Breast cancer Paternal Grandmother   . Breast cancer Cousin        cousins multiple   . Colon cancer Neg Hx   . Colon polyps Neg Hx     . Rectal cancer Neg Hx   . Stomach cancer Neg Hx     ALLERGIES:  is allergic to codeine; other; hydrocodone; latex; and oxycodone.  MEDICATIONS:  Current Outpatient Medications  Medication Sig Dispense Refill  . acyclovir (ZOVIRAX) 400 MG tablet Take 1 tablet (400 mg total) by mouth daily. 30 tablet 6  . Biotin 7500 MCG TABS Take 1 tablet by mouth daily.    Marland Kitchen dexamethasone (DECADRON) 4 MG tablet 2 tabs (8mg ) po daily for 3 days starting the day after completing each chemotherapy 30 tablet 0  . diphenhydrAMINE (BENADRYL) 25 MG tablet Take 25 mg by mouth at bedtime.    . Lactobacillus-Inulin (CULTURELLE DIGESTIVE HEALTH PO) Take 1 capsule by mouth at bedtime.    Marland Kitchen lisinopril-hydrochlorothiazide (PRINZIDE,ZESTORETIC) 10-12.5 MG per tablet Take 1 tablet by mouth daily.     Marland Kitchen LORazepam (ATIVAN) 0.5 MG tablet Take 1 tablet (0.5 mg total) by mouth every 8 (eight) hours. 60 tablet 0  . Melatonin 10 MG CAPS Take 10 mg by mouth at bedtime.     . meloxicam (MOBIC) 15 MG tablet Take 15 mg by mouth daily.    . metoprolol succinate (TOPROL-XL) 50 MG 24 hr tablet Take 50 mg by mouth daily.    . Multiple Vitamin (MULTIVITAMIN) tablet Take 1 tablet by mouth daily.    . ondansetron (ZOFRAN) 8 MG tablet Take 1 tablet (8 mg total) by mouth every 8 (eight) hours as needed for nausea. Starting 2 days after each chemotherapy 30 tablet 3  . prochlorperazine (COMPAZINE) 10 MG tablet Take 1 tablet (10 mg total) by mouth every 6 (six) hours as needed for nausea or vomiting. 30 tablet 0  . rosuvastatin (CRESTOR) 10 MG tablet Take 10 mg by mouth daily.    Marland Kitchen venlafaxine (EFFEXOR) 37.5 MG tablet Take 37.5 mg by mouth once.     No current facility-administered medications for this visit.    REVIEW OF SYSTEMS: A 10+ POINT REVIEW OF SYSTEMS WAS OBTAINED including neurology, dermatology, psychiatry, cardiac, respiratory, lymph, extremities, GI, GU, Musculoskeletal, constitutional, breasts, reproductive, HEENT.  All  pertinent positives are noted in the HPI.  All others are negative.   PHYSICAL EXAMINATION: ECOG PERFORMANCE STATUS: 1 - Symptomatic but completely ambulatory  . Vitals:   04/15/19 1519  BP: 128/87  Pulse: 88  Resp: 18  Temp: 98.5 F (36.9 C)  SpO2: 98%   Filed Weights   04/15/19 1519  Weight: 190 lb 14.4 oz (86.6 kg)   .Body mass index is 34.92 kg/m.   GENERAL:alert, in no acute distress and comfortable SKIN: no acute rashes, no significant lesions EYES: conjunctiva are pink and non-injected, sclera anicteric OROPHARYNX: MMM, no exudates, no oropharyngeal erythema or ulceration NECK: supple, no JVD LYMPH:  no palpable lymphadenopathy in the cervical, axillary or inguinal regions LUNGS: clear to auscultation b/l with normal respiratory effort HEART: regular rate & rhythm ABDOMEN:  normoactive bowel sounds , non tender, not distended. No palpable hepatosplenomegaly.  Extremity: no pedal edema PSYCH: alert & oriented x 3 with fluent speech NEURO: no focal motor/sensory deficits  LABORATORY DATA:  I have reviewed the data as listed  . CBC Latest Ref Rng & Units 04/15/2019 04/04/2019 03/08/2019  WBC 4.0 - 10.5 K/uL 6.8 14.2(H) 8.2  Hemoglobin 12.0 - 15.0 g/dL 15.4(H) 15.3(H) 14.0  Hematocrit 36.0 - 46.0 % 45.1 44.1 40.5  Platelets 150 - 400 K/uL 197 238 184    . CMP Latest Ref Rng & Units 04/15/2019 04/04/2019 03/05/2019  Glucose 70 - 99 mg/dL 204(H) 252(H) 125(H)  BUN 8 - 23 mg/dL 14 24(H) 14  Creatinine 0.44 - 1.00 mg/dL 0.85 0.97 0.83  Sodium 135 - 145 mmol/L 137 139 141  Potassium 3.5 - 5.1 mmol/L 4.4 4.5 3.9  Chloride 98 - 111 mmol/L 97(L) 100 102  CO2 22 - 32 mmol/L 26 25 28   Calcium 8.9 - 10.3 mg/dL 9.6 9.2 8.8(L)  Total Protein 6.5 - 8.1 g/dL 6.9 7.3 6.8  Total Bilirubin 0.3 - 1.2 mg/dL 0.5 0.6 0.4  Alkaline Phos 38 - 126 U/L 51 65 60  AST 15 - 41 U/L 21 18 24   ALT 0 - 44 U/L 50(H) 49(H) 37   . Lab Results  Component Value Date   LDH 234 (H)  04/15/2019   03/08/2019 Lymph Node Biopsy [id number]):      RADIOGRAPHIC STUDIES: I have personally reviewed the radiological images as listed and agreed with the findings in the report. No results found.  ASSESSMENT & PLAN:   #1 Retroperitoneal lymphadenopathy -  Newly Diagnosed follicular lymphoma - likely low grade but accurate grading difficult  11/02/2018 MRI lumbar spine wo contrast revealed "Extensive retroperitoneal lymphadenopathy highly worrisome for neoplastic process such as lymphoproliferative disease. Spondylosis worst at L4-5 where there is narrowing in the right subarticular recess predominantly due to a synovial cyst off the medial margin of the right facet with encroachment on the descending right L5 root. There is mild central canal narrowing overall at this Level."  12/04/2018 CT CAP revealed "Mild-to-moderate abdominal and pelvic lymphadenopathy and new 9 mm left superior mediastinal lymph node, highly suspicious for lymphoproliferative disorder. Stable 8 mm lingular pulmonary nodule, consistent with benign Etiology. Moderate hepatic steatosis. 1.3 cm indeterminate low-attenuation lesion in the right lobe. Recommend continued attention on follow-up CT."  12/18/2018 lymph node biopsy pathology revealing a follicular lymphoma- likely low grade but accurate grading not possible on limited sampling.  02/06/2019 PET/CT scan ([id number]) revealed "1. Bulky hypermetabolic lymphadenopathy in the abdomen and upper pelvis, as described. This is associated with mild hypermetabolic lymphadenopathy in the upper mediastinum  and right axilla. Hypermetabolic lymphadenopathy represents a combination of Deauville 4 and Deauville 5 disease. 2. 8 mm nodule in the lingula without hypermetabolism. Attention on follow-up recommended. 3. Hepatic steatosis. 4.  Aortic Atherosclerois (ICD10-170.0)."  03/08/2019 Lymph Node Biopsy ([id number]) revealed "LYMPH NODE, NEEDLE CORE BIOPSY: -  Follicular lymphoma (grade 1-2 of 3) with elevated proliferation rate."   PLAN: -Discussed pt labwork today, 04/15/19; blood counts look good, blood chemistries are steady  -Discussed 04/15/2019 LDH is still elevated at 234 -Discussed 04/15/2019 Uric acid is WNL at 4.4 -Recommend Miralax daily until bowel movements are more regular -The pt has no prohibitive toxicities from continuing C2D1 of Bendamustine + Rituxan at this time.  -No rapid Rituxan due to flushing  -Will rpt PET/CT after 3-4 cycles -Plan for 6 cycles of treatment, at least 2 cycles after complete response  -Refill Ativan -Will see back with C2D1 of treatment  FOLLOW UP: Please schedule cycle 2 of bendamustine Rituxan [both days].  Labs and MD visit on cycle 2-day 1.  The total time spent in the appt was 20 minutes and more than 50% was on counseling and direct patient cares.  All of the patient's questions were answered with apparent satisfaction. The patient knows to call the clinic with any problems, questions or concerns.  Sullivan Lone MD [place] AAHIVMS [facility] Hematology/Oncology Physician [facility]  (Office):       [phone] (Work cell):  [phone] (Fax):           [phone]  04/15/2019 3:41 PM  I, Yevette Edwards, am acting as a scribe for Dr. Sullivan Lone.   .I have reviewed the above documentation for accuracy and completeness, and I agree with the above. Brunetta Genera MD

## 2019-04-16 ENCOUNTER — Telehealth: Payer: Self-pay | Admitting: Hematology

## 2019-04-16 NOTE — Telephone Encounter (Signed)
Scheduled appt per 12/21 los.  Spoke with pt and she is aware of the appt date and times

## 2019-04-25 ENCOUNTER — Telehealth: Payer: Self-pay | Admitting: *Deleted

## 2019-04-25 NOTE — Telephone Encounter (Signed)
Patient called - has experienced slight increases in resting pulse rate over past 11 days from 50 to 90,  occassionally over 100 with activity. Has frontal headache - like a sinus headace. Also experiencing constipation. Had chemotherapy (Bendamustine/Rituxian) 12/11 and taking nausea medications and saw that increased heart rate was listed as side effect for some, so has stopped those meds and is waiting to see if heart rate drops. Reviewed meds and potential side effects. Has not taken any medication for headache. Offered information that she can take OTC acetaminophen for headache as persistent pain can also increase pulse. She states her nausea is manageable without medications at this time. Encouraged managing nausea with dietary changes if possible. Offered information on taking OTC Senna-S and Miralax to relieve constipation. Advised that if she continues to experience what she described as sinus/allergy symptoms to contact PCP. Encouraged to contact this office if symptoms continue. She verbalized understanding of all information.

## 2019-04-30 ENCOUNTER — Other Ambulatory Visit: Payer: Self-pay | Admitting: Hematology

## 2019-05-02 ENCOUNTER — Other Ambulatory Visit: Payer: Self-pay | Admitting: Emergency Medicine

## 2019-05-02 ENCOUNTER — Inpatient Hospital Stay: Payer: Medicare Other | Attending: Hematology

## 2019-05-02 ENCOUNTER — Inpatient Hospital Stay: Payer: Medicare Other

## 2019-05-02 ENCOUNTER — Other Ambulatory Visit: Payer: Self-pay

## 2019-05-02 ENCOUNTER — Inpatient Hospital Stay: Payer: Medicare Other | Admitting: Hematology

## 2019-05-02 VITALS — BP 146/84 | HR 97 | Temp 97.4°F | Resp 18 | Ht 62.0 in | Wt 195.8 lb

## 2019-05-02 VITALS — BP 128/80 | HR 93 | Temp 97.8°F | Resp 16

## 2019-05-02 DIAGNOSIS — C8298 Follicular lymphoma, unspecified, lymph nodes of multiple sites: Secondary | ICD-10-CM | POA: Diagnosis present

## 2019-05-02 DIAGNOSIS — Z5112 Encounter for antineoplastic immunotherapy: Secondary | ICD-10-CM | POA: Diagnosis present

## 2019-05-02 DIAGNOSIS — Z79899 Other long term (current) drug therapy: Secondary | ICD-10-CM | POA: Diagnosis not present

## 2019-05-02 DIAGNOSIS — Z801 Family history of malignant neoplasm of trachea, bronchus and lung: Secondary | ICD-10-CM | POA: Diagnosis not present

## 2019-05-02 DIAGNOSIS — E0590 Thyrotoxicosis, unspecified without thyrotoxic crisis or storm: Secondary | ICD-10-CM | POA: Diagnosis not present

## 2019-05-02 DIAGNOSIS — Z5111 Encounter for antineoplastic chemotherapy: Secondary | ICD-10-CM | POA: Insufficient documentation

## 2019-05-02 DIAGNOSIS — C8218 Follicular lymphoma grade II, lymph nodes of multiple sites: Secondary | ICD-10-CM | POA: Diagnosis not present

## 2019-05-02 DIAGNOSIS — Z803 Family history of malignant neoplasm of breast: Secondary | ICD-10-CM | POA: Insufficient documentation

## 2019-05-02 DIAGNOSIS — Z7189 Other specified counseling: Secondary | ICD-10-CM

## 2019-05-02 DIAGNOSIS — Z8379 Family history of other diseases of the digestive system: Secondary | ICD-10-CM | POA: Insufficient documentation

## 2019-05-02 DIAGNOSIS — M7130 Other bursal cyst, unspecified site: Secondary | ICD-10-CM | POA: Insufficient documentation

## 2019-05-02 DIAGNOSIS — R0602 Shortness of breath: Secondary | ICD-10-CM | POA: Diagnosis not present

## 2019-05-02 DIAGNOSIS — R590 Localized enlarged lymph nodes: Secondary | ICD-10-CM | POA: Diagnosis not present

## 2019-05-02 DIAGNOSIS — M549 Dorsalgia, unspecified: Secondary | ICD-10-CM | POA: Insufficient documentation

## 2019-05-02 LAB — CBC WITH DIFFERENTIAL/PLATELET
Abs Immature Granulocytes: 0.1 10*3/uL — ABNORMAL HIGH (ref 0.00–0.07)
Basophils Absolute: 0.1 10*3/uL (ref 0.0–0.1)
Basophils Relative: 1 %
Eosinophils Absolute: 0.2 10*3/uL (ref 0.0–0.5)
Eosinophils Relative: 4 %
HCT: 36.3 % (ref 36.0–46.0)
Hemoglobin: 13.1 g/dL (ref 12.0–15.0)
Immature Granulocytes: 2 %
Lymphocytes Relative: 14 %
Lymphs Abs: 0.9 10*3/uL (ref 0.7–4.0)
MCH: 30.8 pg (ref 26.0–34.0)
MCHC: 36.1 g/dL — ABNORMAL HIGH (ref 30.0–36.0)
MCV: 85.4 fL (ref 80.0–100.0)
Monocytes Absolute: 0.8 10*3/uL (ref 0.1–1.0)
Monocytes Relative: 13 %
Neutro Abs: 4.1 10*3/uL (ref 1.7–7.7)
Neutrophils Relative %: 66 %
Platelets: 159 10*3/uL (ref 150–400)
RBC: 4.25 MIL/uL (ref 3.87–5.11)
RDW: 11.9 % (ref 11.5–15.5)
WBC: 6.1 10*3/uL (ref 4.0–10.5)
nRBC: 0 % (ref 0.0–0.2)

## 2019-05-02 LAB — CMP (CANCER CENTER ONLY)
ALT: 30 U/L (ref 0–44)
AST: 20 U/L (ref 15–41)
Albumin: 3.9 g/dL (ref 3.5–5.0)
Alkaline Phosphatase: 73 U/L (ref 38–126)
Anion gap: 13 (ref 5–15)
BUN: 14 mg/dL (ref 8–23)
CO2: 27 mmol/L (ref 22–32)
Calcium: 9 mg/dL (ref 8.9–10.3)
Chloride: 99 mmol/L (ref 98–111)
Creatinine: 0.8 mg/dL (ref 0.44–1.00)
GFR, Est AFR Am: >60 mL/min (ref >60)
GFR, Estimated: >60 mL/min (ref >60)
Glucose, Bld: 245 mg/dL — ABNORMAL HIGH (ref 70–99)
Potassium: 4.2 mmol/L (ref 3.5–5.1)
Sodium: 139 mmol/L (ref 135–145)
Total Bilirubin: 0.4 mg/dL (ref 0.3–1.2)
Total Protein: 6.9 g/dL (ref 6.5–8.1)

## 2019-05-02 LAB — URIC ACID: Uric Acid, Serum: 4.5 mg/dL (ref 2.5–7.1)

## 2019-05-02 LAB — TSH: TSH: 1.229 u[IU]/mL (ref 0.308–3.960)

## 2019-05-02 LAB — D-DIMER, QUANTITATIVE: D-Dimer, Quant: 1.03 ug/mL-FEU — ABNORMAL HIGH (ref 0.00–0.50)

## 2019-05-02 LAB — T4, FREE: Free T4: 2.65 ng/dL — ABNORMAL HIGH (ref 0.61–1.12)

## 2019-05-02 MED ORDER — ACETAMINOPHEN 325 MG PO TABS
650.0000 mg | ORAL_TABLET | Freq: Once | ORAL | Status: AC
  Administered 2019-05-02: 650 mg via ORAL

## 2019-05-02 MED ORDER — DIPHENHYDRAMINE HCL 25 MG PO CAPS
50.0000 mg | ORAL_CAPSULE | Freq: Once | ORAL | Status: AC
  Administered 2019-05-02: 50 mg via ORAL

## 2019-05-02 MED ORDER — DIPHENHYDRAMINE HCL 25 MG PO CAPS
ORAL_CAPSULE | ORAL | Status: AC
  Filled 2019-05-02: qty 1

## 2019-05-02 MED ORDER — SODIUM CHLORIDE 0.9 % IV SOLN
90.0000 mg/m2 | Freq: Once | INTRAVENOUS | Status: AC
  Administered 2019-05-02: 175 mg via INTRAVENOUS
  Filled 2019-05-02: qty 7

## 2019-05-02 MED ORDER — FAMOTIDINE IN NACL 20-0.9 MG/50ML-% IV SOLN
20.0000 mg | Freq: Once | INTRAVENOUS | Status: AC
  Administered 2019-05-02: 20 mg via INTRAVENOUS

## 2019-05-02 MED ORDER — DEXAMETHASONE SODIUM PHOSPHATE 10 MG/ML IJ SOLN
10.0000 mg | Freq: Once | INTRAMUSCULAR | Status: AC
  Administered 2019-05-02: 10 mg via INTRAVENOUS

## 2019-05-02 MED ORDER — DIPHENHYDRAMINE HCL 25 MG PO CAPS
ORAL_CAPSULE | ORAL | Status: AC
  Filled 2019-05-02: qty 2

## 2019-05-02 MED ORDER — DEXAMETHASONE SODIUM PHOSPHATE 10 MG/ML IJ SOLN
INTRAMUSCULAR | Status: AC
  Filled 2019-05-02: qty 1

## 2019-05-02 MED ORDER — PALONOSETRON HCL INJECTION 0.25 MG/5ML
INTRAVENOUS | Status: AC
  Filled 2019-05-02: qty 5

## 2019-05-02 MED ORDER — FAMOTIDINE IN NACL 20-0.9 MG/50ML-% IV SOLN
INTRAVENOUS | Status: AC
  Filled 2019-05-02: qty 50

## 2019-05-02 MED ORDER — PALONOSETRON HCL INJECTION 0.25 MG/5ML
0.2500 mg | Freq: Once | INTRAVENOUS | Status: AC
  Administered 2019-05-02: 0.25 mg via INTRAVENOUS

## 2019-05-02 MED ORDER — ACETAMINOPHEN 325 MG PO TABS
ORAL_TABLET | ORAL | Status: AC
  Filled 2019-05-02: qty 2

## 2019-05-02 MED ORDER — SODIUM CHLORIDE 0.9 % IV SOLN
375.0000 mg/m2 | Freq: Once | INTRAVENOUS | Status: AC
  Administered 2019-05-02: 14:00:00 700 mg via INTRAVENOUS
  Filled 2019-05-02: qty 20

## 2019-05-02 MED ORDER — SODIUM CHLORIDE 0.9 % IV SOLN
Freq: Once | INTRAVENOUS | Status: AC
  Filled 2019-05-02: qty 250

## 2019-05-02 NOTE — Progress Notes (Signed)
HEMATOLOGY/ONCOLOGY CLINIC NOTE  Date of Service: 05/02/2019  Patient Care Team: [name], MD as PCP - General (Internal Medicine)  CHIEF COMPLAINTS/PURPOSE OF CONSULTATION:  Recently diagnosed follicular lymphoma  HISTORY OF PRESENTING ILLNESS:   Meghan Alexander is a wonderful 64 y.o. female who has been referred to [place] by Dr Layne Benton for evaluation and management of possible lymphoproliferative disease.The pt reports that she is doing well overall.  The pt reports that she had an MRI earlier this month because of her back pain. She has had back pain for about 30 years, but in May, it got so bad that she could not move. The pain radiated around her hip and down to her knee. She could not even get in her car to go to the ER. She put herself on bedrest for 5 weeks which improved her symptoms. Now, her back pain is back to baseline.  Pt reported that, in the past 6 months, she has been moving her bowels 3-4x daily, which is much more than normal for her. Throughout the day, her stools get progressively looser. She went to the ER on 10/11/2018 for food poisoning from mayonnaise because she had blood in the stool and cramping. The symptoms began 2 hours after she ate the mayo. She was not placed on antibiotics or any other medications. No stool testing was done. Her symptoms resolved in 2 days.  She experienced night sweats up until 2006, and they returned 6 months ago. She does not take hormone replacements because of her FHx of breast cancer. She also reports some new fatigue, dry eyes, and headaches over the last 6 months. Denies unexpected weight loss. She has not been diagnosed with diabetes.  Of note prior to the patient's visit today, pt has had MRI lumbar spine wo contrast completed on 11/02/2018 with results revealing "Extensive retroperitoneal lymphadenopathy highly worrisome for neoplastic process such as lymphoproliferative disease. 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 There is mild central canal narrowing overall at this Level."  Most recent lab results (10/11/2018) of CBC is as follows: all values are WNL except for Potassium at 3.3, glucose bld at 212. 10/11/2018 Urinalysis revealed hazy appearance 10/11/2018 Lipase at 29  On review of systems, pt reports back pain, 3-4 bowel movements daily, dry eyes, headaches, night sweats, and denies unexpected weight loss, mouth sores, belly pain, changes in urination trouble swallowing, fever, chills, and any other symptoms.   On PMHx the pt reports total hysterectomy at 38 years ago due to ovarian cysts, lymphoreticulosis, sleep apnea related surgery, breast augmentation with no removal of breast tissue On Social Hx the pt reports never smoking On Family Hx the pt reports breast cancer on both sides of the family.   INTERVAL HISTORY  Meghan Alexander is a 64 y.o. female presenting today for the management and evaluation of her follicular lymphoma. Pt is here for f/u for C2 of Bendamustine + Rituxan. The patient's last visit with [place] was on 04/15/2019. The pt reports that she is doing well overall.  The pt reports persistent nausea. Night sweats 04/26/19-04/29/19.   She stopped anti nauseas medication to see if her heart rate would decrease. She is monitoring her heart rate using her FitBit and pulse monitor.   She increased heart rate is accompanied with shortness of breath. She compares it  running on a treadmill and being out of breath.  The nausea medication does help. She is taking 3 pills a day.   She dry heaves a lot which makes her feel better.   She does not feel that food is sitting in her stomach.   She is no longer doing virtual cycling due to SOB.  She has not had any wheezing but she has had pneumonia 5 times resulting in lung  scarring    Lab results today (05/02/19) of CBC w/diff and CMP is as follows: all values are WNL except for MCHC at 36.1 Abs Immature Granulocytes at 0.10, Glucose Bld, at 245.  On review of systems, pt reports nausea, lower abdominal pain and denies cough, infections, changes in bowl habits, wheezing, leg swelling and any other symptoms.   MEDICAL HISTORY:  Past Medical History:  Diagnosis Date  . Allergy    year around allergies  . Arthritis    fingers, knees, neck, back-osteoarthristis  . Bronchitis    hx of  . Complication of anesthesia   . 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 GERD (gastroesophageal reflux disease)    hx of reflux-went away with elevating HOB   . Hypertension   . Pneumonia    hx of   . PONV (postoperative nausea and vomiting)     SURGICAL HISTORY: Past Surgical History:  Procedure Laterality Date  . ABDOMINAL HYSTERECTOMY    . ADENOIDECTOMY    . cosmetic surgeries    . ESOPHAGEAL MANOMETRY N/A 12/02/2015   Procedure: ESOPHAGEAL MANOMETRY (EM);  Surgeon: [name], MD;  Location: [facility];  Service: Endoscopy;  Laterality: N/A;  . ESOPHAGOGASTRODUODENOSCOPY N/A 12/02/2015   Procedure: ESOPHAGOGASTRODUODENOSCOPY (EGD);  Surgeon: [name], MD;  Location: [facility];  Service: Endoscopy;  Laterality: N/A;  . lymph node removal left leg     cat scratch surgery   . THROAT SURGERY     sleep apnea surgery   . TONSILLECTOMY      SOCIAL HISTORY: Social History   Socioeconomic History  . Marital status: Married    Spouse name: Not on file  . Number of children: Not on file  . Years of education: Not on file  . Highest education level: Not on file  Occupational History  . Not on file  Tobacco Use  . Smoking status: Never Smoker  . Smokeless tobacco: Never Used  Substance and Sexual Activity  . Alcohol use: No    Alcohol/week: 0.0 standard drinks  . Drug use: No  . Sexual activity: Not on  file  Other Topics Concern  . 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 Difficulty of Paying Living Expenses: Not on file  Food Insecurity:   . Worried About Charity fundraiser in the Last Year: Not on file  . Ran Out of Food in the Last Year: Not on file  Transportation Needs:   . Lack of Transportation (Medical): Not on file  . Lack of Transportation (Non-Medical): Not on file  Physical Activity:   . Days of Exercise per Week: Not on file  . Minutes of Exercise per Session: Not on file  Stress:   . Feeling of Stress : Not on file  Social Connections:   . Frequency of Communication with Friends and Family: Not on file  . Frequency of Social Gatherings with Friends and Family: Not on file  . Attends Religious Services: Not on file  . Active Member of Clubs or Organizations: Not on file  . Attends Archivist Meetings: Not on file  . Marital Status: Not on file  Intimate Partner Violence:   . Fear of Current or Ex-Partner: Not on file  . Emotionally Abused: Not on file  . Physically Abused: Not on file  . Sexually Abused: Not on file    FAMILY HISTORY: Family History  Problem Relation Age of Onset  . Lung cancer Father        1982 died from it  . Hernia Mother   . Breast cancer Maternal Aunt        multiple   . Breast cancer Maternal Grandmother   . Breast cancer Paternal Grandmother   . Breast cancer Cousin        cousins multiple   . Colon cancer Neg Hx   . Colon polyps Neg Hx   . Rectal cancer Neg Hx   . Stomach cancer Neg Hx     ALLERGIES:  is allergic to codeine; other; hydrocodone; latex; and oxycodone.  MEDICATIONS:  Current Outpatient Medications  Medication Sig Dispense Refill  .  acyclovir (ZOVIRAX) 400 MG tablet Take 1 tablet (400 mg total) by mouth daily. 30 tablet 6  . Biotin 7500 MCG TABS Take 1 tablet by mouth daily.    Marland Kitchen dexamethasone (DECADRON) 4 MG tablet 2 tabs (8mg ) po daily for 3 days starting the day after completing each chemotherapy 30 tablet 0  . diphenhydrAMINE (BENADRYL) 25 MG tablet Take 25 mg by mouth at bedtime.    . Lactobacillus-Inulin (CULTURELLE DIGESTIVE HEALTH PO) Take 1 capsule by mouth at bedtime.    Marland Kitchen lisinopril-hydrochlorothiazide (PRINZIDE,ZESTORETIC) 10-12.5 MG per tablet Take 1 tablet by mouth daily.     Marland Kitchen LORazepam (ATIVAN) 0.5 MG tablet Take 1 tablet (0.5 mg total) by mouth every 8 (eight) hours. 60 tablet 0  . Melatonin 10 MG CAPS Take 10 mg by mouth at bedtime.     . meloxicam (MOBIC) 15 MG tablet Take 15 mg by mouth daily.    . metoprolol succinate (TOPROL-XL) 50 MG 24 hr tablet Take 50 mg by mouth daily.    . Multiple Vitamin (MULTIVITAMIN) tablet Take 1 tablet by mouth daily.    . ondansetron (ZOFRAN) 8 MG tablet Take 1 tablet (8 mg total) by mouth every 8 (eight) hours as needed for nausea. Starting 2 days after each chemotherapy 30 tablet 3  . prochlorperazine (COMPAZINE) 10 MG tablet Take 1 tablet (10 mg total) by mouth every 6 (six) hours as needed for nausea or vomiting. 30 tablet 0  . rosuvastatin (CRESTOR) 10 MG tablet Take 10 mg by mouth daily.    Marland Kitchen venlafaxine (EFFEXOR) 37.5 MG tablet Take 37.5 mg by mouth once.     No current facility-administered medications for this visit.    REVIEW OF SYSTEMS: A 10+ POINT REVIEW OF SYSTEMS WAS OBTAINED including neurology, dermatology, psychiatry, cardiac, respiratory, lymph, extremities, GI, GU, Musculoskeletal, constitutional, breasts, reproductive, HEENT.  All pertinent positives are noted in the HPI.  All others are negative.   PHYSICAL EXAMINATION: ECOG FS:1 - Symptomatic but completely ambulatory  Vitals:   05/02/19 1101  BP: (!) 146/84  Pulse: 97  Resp: 18  Temp: (!)  97.4 F (36.3 C)  SpO2: 97%   Wt Readings from Last 3 Encounters:  05/02/19 195 lb 12.8 oz (88.8 kg)  04/15/19 190 lb 14.4 oz (86.6 kg)  04/04/19 193 lb 4 oz (87.7 kg)   Body mass index is 35.81 kg/m.    GENERAL:alert, in no acute distress and comfortable SKIN: no acute rashes, no significant lesions EYES: conjunctiva are pink and non-injected, sclera anicteric OROPHARYNX: MMM, no exudates, no oropharyngeal erythema or ulceration NECK: supple, no JVD LYMPH:  no palpable lymphadenopathy in the cervical, axillary or inguinal regions LUNGS: clear to auscultation b/l with normal respiratory effort HEART: regular rate & rhythm ABDOMEN:  normoactive bowel sounds , non tender, not distended. Extremity: no pedal edema PSYCH: alert & oriented x 3 with fluent speech NEURO: no focal motor/sensory deficits   LABORATORY DATA:  I have reviewed the data as listed  . CBC Latest Ref Rng & Units 05/02/2019 04/15/2019 04/04/2019  WBC 4.0 - 10.5 K/uL 6.1 6.8 14.2(H)  Hemoglobin 12.0 - 15.0 g/dL 13.1 15.4(H) 15.3(H)  Hematocrit 36.0 - 46.0 % 36.3 45.1 44.1  Platelets 150 - 400 K/uL 159 197 238    . CMP Latest Ref Rng & Units 05/02/2019 04/15/2019 04/04/2019  Glucose 70 - 99 mg/dL 245(H) 204(H) 252(H)  BUN 8 - 23 mg/dL 14 14 24(H)  Creatinine 0.44 - 1.00 mg/dL 0.80 0.85 0.97  Sodium 135 - 145 mmol/L 139 137 139  Potassium 3.5 - 5.1 mmol/L 4.2 4.4 4.5  Chloride 98 - 111 mmol/L 99 97(L) 100  CO2 22 - 32 mmol/L 27 26 25   Calcium 8.9 - 10.3 mg/dL 9.0 9.6 9.2  Total Protein 6.5 - 8.1 g/dL 6.9 6.9 7.3  Total Bilirubin 0.3 - 1.2 mg/dL 0.4 0.5 0.6  Alkaline Phos 38 - 126 U/L 73 51 65  AST 15 - 41 U/L 20 21 18   ALT 0 - 44 U/L 30 50(H) 49(H)   . Lab Results  Component Value Date   LDH 234 (H) 04/15/2019   03/08/2019 Lymph Node Biopsy [id number]):      RADIOGRAPHIC STUDIES: I have personally reviewed the radiological images as listed and agreed with the findings in the report. No  results found.  ASSESSMENT & PLAN:   #1 Retroperitoneal lymphadenopathy -  Newly Diagnosed follicular lymphoma - likely low grade but accurate grading difficult  11/02/2018 MRI lumbar spine wo contrast revealed "Extensive retroperitoneal lymphadenopathy highly worrisome for neoplastic process such as lymphoproliferative disease. Spondylosis worst at L4-5 where there is narrowing in the right subarticular recess predominantly due to a synovial cyst off the medial margin of the right facet with encroachment on the descending right L5 root. There is mild central canal narrowing overall at this Level."  12/04/2018 CT CAP revealed "Mild-to-moderate abdominal and pelvic lymphadenopathy and new 9 mm left superior mediastinal lymph node, highly suspicious for lymphoproliferative disorder. Stable 8 mm lingular pulmonary nodule, consistent with benign Etiology. Moderate hepatic steatosis. 1.3 cm indeterminate low-attenuation lesion in the right lobe. Recommend continued attention on follow-up CT."  12/18/2018 lymph node biopsy pathology revealing a follicular lymphoma- likely low grade but accurate grading not possible on limited sampling.  02/06/2019 PET/CT scan ([id number]) revealed "1. Bulky hypermetabolic lymphadenopathy in the abdomen and upper pelvis, as described. This is associated with mild hypermetabolic lymphadenopathy in the upper mediastinum and right axilla. Hypermetabolic lymphadenopathy represents a combination of Deauville 4 and Deauville 5 disease. 2. 8 mm nodule in  the lingula without hypermetabolism. Attention on follow-up recommended. 3. Hepatic steatosis. 4.  Aortic Atherosclerois (ICD10-170.0)."  03/08/2019 Lymph Node Biopsy ([id number]) revealed "LYMPH NODE, NEEDLE CORE BIOPSY: - Follicular lymphoma (grade 1-2 of 3) with elevated proliferation rate."   PLAN: -Discussed pt labwork today, 05/02/19; all values are WNL except for MCHC at 36.1 Abs Immature Granulocytes at 0.10, Glucose  Bld, at 245. -Discussed 05/02/19 Glucose at 245. Advised that if it remains high once she is no longer on steroids she should follow up with PCP.  -Discussed 05/02/19 blood counts are okay -Advised that her heart rate being in the 90's is not overtly concerning due to anxiety and taking steroids.  -Advised that caffeine intake can also influence increased  -Recommended holding acyclovir to determine if it is contributing to nausea. -Discussed pain levels. -Advised that adjustments to medications may be needed to help with pulse rate and BP -Recommended staying active, consume appropriate amounts of water, and monitor food intake. -Will have treatment today, EKG --NSR without any tachycardia or arryhtmia. .   FOLLOW UP: -Please schedule C3 of Bendamustine/Rituxan as ordered in 4 weeks with labs and MD visit -EKG and additional labs today  The total time spent in the appt was 30 minutes and more than 50% was on counseling and direct patient cares.  All of the patient's questions were answered with apparent satisfaction. The patient knows to call the clinic with any problems, questions or concerns.   Sullivan Lone MD MS AAHIVMS [facility] Hematology/Oncology Physician [facility]  (Office):       [phone] (Work cell):  [phone] (Fax):           [phone]  05/02/2019 7:10 AM  I, Scot Dock, am acting as a scribe for Dr. Sullivan Lone.   .I have reviewed the above documentation for accuracy and completeness, and I agree with the above. Brunetta Genera MD   ADDENDUM     Component     Latest Ref Rng & Units 05/02/2019  D-Dimer, Quant     0.00 - 0.50 ug/mL-FEU 1.03 (H)  T4,Free(Direct)     0.61 - 1.12 ng/dL 2.65 (H)  TSH     0.308 - 3.960 uIU/mL 1.229    Elevated FT4  With normal TSH ?thyroiditis -rpt labs -endocrinology referral  Elevated d-dimer -CTA chest -start Aspirin pending CTA chest   .Brunetta Genera MD TT 45 mins >50% on direct  patient contact/counseling and co-ordination of cares

## 2019-05-03 ENCOUNTER — Inpatient Hospital Stay: Payer: Medicare Other

## 2019-05-03 ENCOUNTER — Telehealth: Payer: Self-pay | Admitting: Hematology

## 2019-05-03 ENCOUNTER — Other Ambulatory Visit: Payer: Self-pay

## 2019-05-03 VITALS — BP 149/77 | HR 100 | Temp 98.3°F | Resp 18

## 2019-05-03 DIAGNOSIS — Z7189 Other specified counseling: Secondary | ICD-10-CM

## 2019-05-03 DIAGNOSIS — C8218 Follicular lymphoma grade II, lymph nodes of multiple sites: Secondary | ICD-10-CM

## 2019-05-03 DIAGNOSIS — Z5112 Encounter for antineoplastic immunotherapy: Secondary | ICD-10-CM | POA: Diagnosis not present

## 2019-05-03 MED ORDER — SODIUM CHLORIDE 0.9 % IV SOLN
90.0000 mg/m2 | Freq: Once | INTRAVENOUS | Status: AC
  Administered 2019-05-03: 11:00:00 175 mg via INTRAVENOUS
  Filled 2019-05-03: qty 7

## 2019-05-03 MED ORDER — SODIUM CHLORIDE 0.9 % IV SOLN
Freq: Once | INTRAVENOUS | Status: AC
  Filled 2019-05-03: qty 250

## 2019-05-03 MED ORDER — DEXAMETHASONE SODIUM PHOSPHATE 10 MG/ML IJ SOLN
10.0000 mg | Freq: Once | INTRAMUSCULAR | Status: AC
  Administered 2019-05-03: 10 mg via INTRAVENOUS

## 2019-05-03 MED ORDER — DEXAMETHASONE SODIUM PHOSPHATE 10 MG/ML IJ SOLN
INTRAMUSCULAR | Status: AC
  Filled 2019-05-03: qty 1

## 2019-05-03 NOTE — Telephone Encounter (Signed)
Scheduled appt per 1/7 los 

## 2019-05-09 ENCOUNTER — Telehealth: Payer: Self-pay | Admitting: *Deleted

## 2019-05-09 NOTE — Telephone Encounter (Signed)
Contacted patient regarding test results per Dr. Grier Mitts directions below: Please contact patient:    Elevated FT4 with normal TSH, could indicate thyroiditis -repeat labs ordered (schedule message sent) Endocrinology referral placed  Elevated d-dimer - CT Angio chest ordered Start 81 mg Aspirin daily pending CTA chest  Patient verbalized understanding of all information.

## 2019-05-09 NOTE — Telephone Encounter (Signed)
ASA 81 mg per day - verbal order from Dr. Irene Limbo Added to medication list

## 2019-05-13 ENCOUNTER — Other Ambulatory Visit: Payer: Self-pay | Admitting: Hematology

## 2019-05-13 ENCOUNTER — Other Ambulatory Visit: Payer: Self-pay | Admitting: *Deleted

## 2019-05-13 DIAGNOSIS — C8218 Follicular lymphoma grade II, lymph nodes of multiple sites: Secondary | ICD-10-CM

## 2019-05-14 ENCOUNTER — Ambulatory Visit (HOSPITAL_COMMUNITY)
Admission: RE | Admit: 2019-05-14 | Discharge: 2019-05-14 | Disposition: A | Discharge status: Home / Self Care | Payer: Medicare Other | Source: Ambulatory Visit | Attending: Hematology | Admitting: Hematology

## 2019-05-14 ENCOUNTER — Inpatient Hospital Stay: Payer: Medicare Other

## 2019-05-14 ENCOUNTER — Other Ambulatory Visit: Payer: Self-pay

## 2019-05-14 ENCOUNTER — Encounter (HOSPITAL_COMMUNITY): Payer: Self-pay

## 2019-05-14 DIAGNOSIS — R0602 Shortness of breath: Secondary | ICD-10-CM | POA: Diagnosis present

## 2019-05-14 DIAGNOSIS — C8218 Follicular lymphoma grade II, lymph nodes of multiple sites: Secondary | ICD-10-CM

## 2019-05-14 DIAGNOSIS — Z5112 Encounter for antineoplastic immunotherapy: Secondary | ICD-10-CM | POA: Diagnosis not present

## 2019-05-14 LAB — CMP (CANCER CENTER ONLY)
ALT: 30 U/L (ref 0–44)
AST: 19 U/L (ref 15–41)
Albumin: 3.9 g/dL (ref 3.5–5.0)
Alkaline Phosphatase: 57 U/L (ref 38–126)
Anion gap: 12 (ref 5–15)
BUN: 13 mg/dL (ref 8–23)
CO2: 28 mmol/L (ref 22–32)
Calcium: 9.2 mg/dL (ref 8.9–10.3)
Chloride: 99 mmol/L (ref 98–111)
Creatinine: 0.74 mg/dL (ref 0.44–1.00)
GFR, Est AFR Am: >60 mL/min (ref >60)
GFR, Estimated: >60 mL/min (ref >60)
Glucose, Bld: 200 mg/dL — ABNORMAL HIGH (ref 70–99)
Potassium: 4.1 mmol/L (ref 3.5–5.1)
Sodium: 139 mmol/L (ref 135–145)
Total Bilirubin: 0.3 mg/dL (ref 0.3–1.2)
Total Protein: 6.5 g/dL (ref 6.5–8.1)

## 2019-05-14 MED ORDER — SODIUM CHLORIDE (PF) 0.9 % IJ SOLN
INTRAMUSCULAR | Status: AC
  Filled 2019-05-14: qty 50

## 2019-05-14 MED ORDER — IOHEXOL 350 MG/ML SOLN
100.0000 mL | Freq: Once | INTRAVENOUS | Status: AC | PRN
  Administered 2019-05-14: 100 mL via INTRAVENOUS

## 2019-05-16 ENCOUNTER — Telehealth: Payer: Self-pay | Admitting: Hematology

## 2019-05-16 NOTE — Telephone Encounter (Signed)
I called Ms. Ringstad 2 times to see how she was doing and discussed the results of her CTA of the chest.  Unable to reach her and was forwarded to voicemail. Left a short message on her voicemail noting that the CT of the chest showed no acute pathology.  No PE. My nurse Lovey Newcomer has already informed her of her abnormal thyroid functions and she has a referral to endocrinology.  Sullivan Lone MD MS Hematology/Oncology Physician [facility]

## 2019-05-17 ENCOUNTER — Other Ambulatory Visit: Payer: Self-pay

## 2019-05-20 ENCOUNTER — Ambulatory Visit: Payer: Medicare Other | Admitting: Internal Medicine

## 2019-05-20 ENCOUNTER — Encounter: Payer: Self-pay | Admitting: Internal Medicine

## 2019-05-20 ENCOUNTER — Other Ambulatory Visit: Payer: Self-pay

## 2019-05-20 VITALS — BP 130/90 | HR 82 | Temp 98.2°F | Ht 62.0 in | Wt 196.2 lb

## 2019-05-20 DIAGNOSIS — R7989 Other specified abnormal findings of blood chemistry: Secondary | ICD-10-CM

## 2019-05-20 LAB — TSH: TSH: 1.12 u[IU]/mL (ref 0.35–4.50)

## 2019-05-20 LAB — T3, FREE: T3, Free: 3.3 pg/mL (ref 2.3–4.2)

## 2019-05-20 LAB — T4, FREE: Free T4: 0.74 ng/dL (ref 0.60–1.60)

## 2019-05-20 NOTE — Progress Notes (Signed)
Name: Meghan Alexander  MRN/ DOB: [id number], 24-Jan-1956    Age/ Sex: 64 y.o., female    PCP: [name], MD   Reason for Endocrinology Evaluation: Elevated Ft4     Date of Initial Endocrinology Evaluation: 05/21/2019     HPI: Meghan Alexander is a 64 y.o. female with a past medical history of follicular lymphoma The patient presented for initial endocrinology clinic visit on 05/21/2019 for consultative assistance with her elevated Free T4.  Ms. Bealer presented to Dr. Grier Mitts office for evaluation of lymphoproliferative  disease in 10/2018.   She was started on chemo in 03/2019 with Rituxan, and Bendamustine , she just finished the second cycle. Since her chemo she has been having fatigue and nausea. She also noted constipation , sob and palpitations.   Prior to chemo she had normal BM's that she attributes to probiotic intake.  She continues with hot flashes but has somewhat improved since being on the effexor again . Weight has been fluctuating since chemo  Denies visual changes Has constant headaches since chemo , but none prior to chemo.    Sister with grave's disease.   She was on biotin but has stopped a few weeks ago.    HISTORY:  Past Medical History:  Past Medical History:  Diagnosis Date  . Allergy    year around allergies  . Arthritis    fingers, knees, neck, back-osteoarthristis  . Bronchitis    hx of  . Complication of anesthesia   . 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 GERD (gastroesophageal reflux disease)    hx of reflux-went away with elevating HOB   . Hypertension   . Pneumonia    hx of   . PONV (postoperative nausea and vomiting)    Past Surgical History:  Past Surgical History:  Procedure Laterality Date  . ABDOMINAL HYSTERECTOMY    . ADENOIDECTOMY    . cosmetic surgeries    . ESOPHAGEAL MANOMETRY N/A 12/02/2015   Procedure: ESOPHAGEAL MANOMETRY (EM);  Surgeon: [name], MD;  Location: [facility]  ENDOSCOPY;  Service: Endoscopy;  Laterality: N/A;  . ESOPHAGOGASTRODUODENOSCOPY N/A 12/02/2015   Procedure: ESOPHAGOGASTRODUODENOSCOPY (EGD);  Surgeon: [name], MD;  Location: [facility];  Service: Endoscopy;  Laterality: N/A;  . lymph node removal left leg     cat scratch surgery   . THROAT SURGERY     sleep apnea surgery   . TONSILLECTOMY      Social History:  reports that she has never smoked. She has never used smokeless tobacco. She reports that she does not drink alcohol or use drugs. Family History: family history includes Breast cancer in her cousin, maternal aunt, maternal grandmother, and paternal grandmother; Hernia in her mother; Lung cancer in her father.   HOME MEDICATIONS: Allergies as of 05/20/2019      Reactions   Latex Other (See Comments)   Blisters / skin peeling Blisters / skin peeling BLISTERS RASH    Codeine Nausea And Vomiting, Other (See Comments)   hallucinations hallucinations HALLUCINATIONS.    Other Other (See Comments)   DermaPlast? Used after surgery - caused swelling, itching, and wound broke open   Hydrocodone Nausea And Vomiting   Oxycodone Nausea And Vomiting      Medication List       Accurate as of May 20, 2019 11:59 PM. If you have any questions, ask your nurse or doctor.        STOP taking these  medications   Biotin 10 MG Caps Stopped by: Dorita Sciara, MD   Biotin 7500 MCG Tabs Stopped by: Dorita Sciara, MD     TAKE these medications   acyclovir 400 MG tablet Commonly known as: ZOVIRAX Take 1 tablet (400 mg total) by mouth daily.   aspirin EC 81 MG tablet Take 81 mg by mouth daily.   CULTURELLE DIGESTIVE HEALTH PO Take 1 capsule by mouth at bedtime.   dexamethasone 4 MG tablet Commonly known as: DECADRON 2 tabs (8mg ) po daily for 3 days starting the day after completing each chemotherapy   diphenhydrAMINE 25 MG tablet Commonly known as: BENADRYL Take 25 mg by mouth at bedtime.    lisinopril-hydrochlorothiazide 10-12.5 MG tablet Commonly known as: ZESTORETIC TK 1 T PO QD What changed: Another medication with the same name was removed. Continue taking this medication, and follow the directions you see here. Changed by: Dorita Sciara, MD   LORazepam 0.5 MG tablet Commonly known as: ATIVAN TAKE 1 TABLET(0.5 MG) BY MOUTH EVERY 8 HOURS   Melatonin 10 MG Caps Take 10 mg by mouth at bedtime.   meloxicam 15 MG tablet Commonly known as: MOBIC Take 15 mg by mouth daily.   metoprolol succinate 50 MG 24 hr tablet Commonly known as: TOPROL-XL Take 50 mg by mouth daily.   multivitamin tablet Take 1 tablet by mouth daily.   ondansetron 8 MG tablet Commonly known as: ZOFRAN Take 1 tablet (8 mg total) by mouth every 8 (eight) hours as needed for nausea. Starting 2 days after each chemotherapy   predniSONE 10 MG tablet Commonly known as: DELTASONE Take 10 mg by mouth 2 (two) times daily.   prochlorperazine 10 MG tablet Commonly known as: COMPAZINE Take 1 tablet (10 mg total) by mouth every 6 (six) hours as needed for nausea or vomiting.   rosuvastatin 10 MG tablet Commonly known as: CRESTOR Take 10 mg by mouth daily.   venlafaxine 37.5 MG tablet Commonly known as: EFFEXOR Take 37.5 mg by mouth once.         REVIEW OF SYSTEMS: A comprehensive ROS was conducted with the patient and is negative except as per HPI .   OBJECTIVE:  VS: BP 130/90   Pulse 82   Temp 98.2 F (36.8 C) (Oral)   Ht 5\' 2"  (1.575 m)   Wt 196 lb 3.2 oz (89 kg)   SpO2 97%   BMI 35.89 kg/m    Wt Readings from Last 3 Encounters:  05/20/19 196 lb 3.2 oz (89 kg)  05/02/19 195 lb 12.8 oz (88.8 kg)  04/15/19 190 lb 14.4 oz (86.6 kg)     EXAM: General: Pt appears well and is in NAD  Eyes: External eye exam normal without stare, lid lag or exophthalmos.  EOM intact.    Neck: General: Supple without adenopathy. Thyroid: Thyroid size normal.  No goiter or nodules  appreciated. No thyroid bruit.  Lungs: Clear with good BS bilat with no rales, rhonchi, or wheezes  Heart: Auscultation: RRR.  Abdomen: Normoactive bowel sounds, soft, nontender, without masses or organomegaly palpable  Extremities:  BL LE: No pretibial edema normal ROM and strength.  Skin: Hair: Texture and amount normal with gender appropriate distribution Skin Inspection: No rashes, acanthosis nigricans/skin tags. No lipohypertrophy Skin Palpation: Skin temperature, texture, and thickness normal to palpation  Neuro: Cranial nerves: II - XII grossly intact  Cerebellar: Normal coordination and movement; no tremor Motor: Normal strength throughout DTRs: 2+ and symmetric  in UE without delay in relaxation phase  Mental Status: Judgment, insight: Intact Orientation: Oriented to time, place, and person Memory: Intact for recent and remote events Mood and affect: No depression, anxiety, or agitation     DATA REVIEWED: Results for Fentress, Meghan "CYNDEE" (MRN [id number]) as of 05/21/2019 07:24  Ref. Range 05/02/2019 12:40 05/20/2019 13:50  TSH Latest Ref Range: 0.35 - 4.50 uIU/mL 1.229 1.12  Triiodothyronine,Free,Serum Latest Ref Range: 2.3 - 4.2 pg/mL  3.3  T4,Free(Direct) Latest Ref Range: 0.60 - 1.60 ng/dL 2.65 (H) 0.74      ASSESSMENT/PLAN/RECOMMENDATIONS:   Elevated FT4 in the setting of non-suppressed TSH :    The differential diagnosis of an elevated FT4 with non-suppressed TSH is thyroidal illness, assay interference, resistance to thyroid hormone,and  TSH producing adenoma . - Pt with non-specific symptoms that is mostly attributed to chemo treatment.  - She has no local neck symptoms  - Repeat labs today show normalization of FT4. This could be due to recovered thyroidal illness vs assay interference with Biotin.   F/U in 3 months or PRN   Signed electronically by: Mack Guise, MD  [facility] Endocrinology  [address] [place], [place] [address] Phone: [phone] FAX: [phone]   CC: [name], [place], [place] [address] Phone: [phone] Fax: [phone]   Return to Endocrinology clinic as below: Future Appointments  Date Time Provider [place]  05/30/2019 10:00 AM [facility] LAB 5 [facility] None  05/30/2019 10:40 AM Brunetta Genera, MD [facility] None  05/30/2019 11:45 AM [facility] INFUSION [facility] None  05/31/2019 10:00 AM [facility] INFUSION [facility] None  06/27/2019  8:30 AM [facility] LAB 2 [facility] None  06/27/2019  9:00 AM Brunetta Genera, MD [facility] None  06/27/2019  9:45 AM [facility] INFUSION [facility] None  06/28/2019 10:00 AM [facility] INFUSION [facility] None  08/19/2019  9:50 AM Lorena Benham, Melanie Crazier, MD [facility] None

## 2019-05-20 NOTE — Patient Instructions (Signed)
-   Please stop at the lab today will contact you with the results

## 2019-05-21 DIAGNOSIS — R7989 Other specified abnormal findings of blood chemistry: Secondary | ICD-10-CM | POA: Insufficient documentation

## 2019-05-30 ENCOUNTER — Other Ambulatory Visit: Payer: Self-pay

## 2019-05-30 ENCOUNTER — Inpatient Hospital Stay: Payer: Medicare Other | Attending: Hematology

## 2019-05-30 ENCOUNTER — Inpatient Hospital Stay: Payer: Medicare Other | Admitting: Hematology

## 2019-05-30 ENCOUNTER — Inpatient Hospital Stay: Payer: Medicare Other

## 2019-05-30 VITALS — BP 161/95 | HR 96 | Temp 97.8°F | Resp 18 | Ht 62.0 in | Wt 195.8 lb

## 2019-05-30 VITALS — BP 135/76 | HR 86 | Temp 99.2°F | Resp 17

## 2019-05-30 DIAGNOSIS — F419 Anxiety disorder, unspecified: Secondary | ICD-10-CM | POA: Insufficient documentation

## 2019-05-30 DIAGNOSIS — Z803 Family history of malignant neoplasm of breast: Secondary | ICD-10-CM | POA: Insufficient documentation

## 2019-05-30 DIAGNOSIS — Z801 Family history of malignant neoplasm of trachea, bronchus and lung: Secondary | ICD-10-CM | POA: Diagnosis not present

## 2019-05-30 DIAGNOSIS — R05 Cough: Secondary | ICD-10-CM | POA: Diagnosis not present

## 2019-05-30 DIAGNOSIS — Z5111 Encounter for antineoplastic chemotherapy: Secondary | ICD-10-CM | POA: Insufficient documentation

## 2019-05-30 DIAGNOSIS — R61 Generalized hyperhidrosis: Secondary | ICD-10-CM | POA: Diagnosis not present

## 2019-05-30 DIAGNOSIS — M549 Dorsalgia, unspecified: Secondary | ICD-10-CM | POA: Diagnosis not present

## 2019-05-30 DIAGNOSIS — Z8379 Family history of other diseases of the digestive system: Secondary | ICD-10-CM | POA: Diagnosis not present

## 2019-05-30 DIAGNOSIS — I700 Atherosclerosis of aorta: Secondary | ICD-10-CM | POA: Insufficient documentation

## 2019-05-30 DIAGNOSIS — Z79899 Other long term (current) drug therapy: Secondary | ICD-10-CM | POA: Insufficient documentation

## 2019-05-30 DIAGNOSIS — Z5112 Encounter for antineoplastic immunotherapy: Secondary | ICD-10-CM | POA: Diagnosis present

## 2019-05-30 DIAGNOSIS — M479 Spondylosis, unspecified: Secondary | ICD-10-CM | POA: Diagnosis not present

## 2019-05-30 DIAGNOSIS — C8218 Follicular lymphoma grade II, lymph nodes of multiple sites: Secondary | ICD-10-CM | POA: Insufficient documentation

## 2019-05-30 DIAGNOSIS — Z7189 Other specified counseling: Secondary | ICD-10-CM

## 2019-05-30 DIAGNOSIS — C8298 Follicular lymphoma, unspecified, lymph nodes of multiple sites: Secondary | ICD-10-CM

## 2019-05-30 LAB — CBC WITH DIFFERENTIAL/PLATELET
Abs Immature Granulocytes: 0.05 10*3/uL (ref 0.00–0.07)
Basophils Absolute: 0 10*3/uL (ref 0.0–0.1)
Basophils Relative: 1 %
Eosinophils Absolute: 0.1 10*3/uL (ref 0.0–0.5)
Eosinophils Relative: 1 %
HCT: 37.8 % (ref 36.0–46.0)
Hemoglobin: 13.4 g/dL (ref 12.0–15.0)
Immature Granulocytes: 1 %
Lymphocytes Relative: 19 %
Lymphs Abs: 0.9 10*3/uL (ref 0.7–4.0)
MCH: 30.9 pg (ref 26.0–34.0)
MCHC: 35.4 g/dL (ref 30.0–36.0)
MCV: 87.1 fL (ref 80.0–100.0)
Monocytes Absolute: 0.6 10*3/uL (ref 0.1–1.0)
Monocytes Relative: 14 %
Neutro Abs: 3 10*3/uL (ref 1.7–7.7)
Neutrophils Relative %: 64 %
Platelets: 167 10*3/uL (ref 150–400)
RBC: 4.34 MIL/uL (ref 3.87–5.11)
RDW: 13.1 % (ref 11.5–15.5)
WBC: 4.6 10*3/uL (ref 4.0–10.5)
nRBC: 0 % (ref 0.0–0.2)

## 2019-05-30 LAB — CMP (CANCER CENTER ONLY)
ALT: 37 U/L (ref 0–44)
AST: 35 U/L (ref 15–41)
Albumin: 4.1 g/dL (ref 3.5–5.0)
Alkaline Phosphatase: 49 U/L (ref 38–126)
Anion gap: 13 (ref 5–15)
BUN: 11 mg/dL (ref 8–23)
CO2: 27 mmol/L (ref 22–32)
Calcium: 9.4 mg/dL (ref 8.9–10.3)
Chloride: 98 mmol/L (ref 98–111)
Creatinine: 0.71 mg/dL (ref 0.44–1.00)
GFR, Est AFR Am: >60 mL/min (ref >60)
GFR, Estimated: >60 mL/min (ref >60)
Glucose, Bld: 250 mg/dL — ABNORMAL HIGH (ref 70–99)
Potassium: 3.6 mmol/L (ref 3.5–5.1)
Sodium: 138 mmol/L (ref 135–145)
Total Bilirubin: 0.4 mg/dL (ref 0.3–1.2)
Total Protein: 6.7 g/dL (ref 6.5–8.1)

## 2019-05-30 MED ORDER — DIPHENHYDRAMINE HCL 25 MG PO CAPS
ORAL_CAPSULE | ORAL | Status: AC
  Filled 2019-05-30: qty 2

## 2019-05-30 MED ORDER — SODIUM CHLORIDE 0.9 % IV SOLN
375.0000 mg/m2 | Freq: Once | INTRAVENOUS | Status: AC
  Administered 2019-05-30: 700 mg via INTRAVENOUS
  Filled 2019-05-30: qty 20

## 2019-05-30 MED ORDER — ACETAMINOPHEN 325 MG PO TABS
ORAL_TABLET | ORAL | Status: AC
  Filled 2019-05-30: qty 2

## 2019-05-30 MED ORDER — PALONOSETRON HCL INJECTION 0.25 MG/5ML
INTRAVENOUS | Status: AC
  Filled 2019-05-30: qty 5

## 2019-05-30 MED ORDER — DEXAMETHASONE SODIUM PHOSPHATE 10 MG/ML IJ SOLN
10.0000 mg | Freq: Once | INTRAMUSCULAR | Status: AC
  Administered 2019-05-30: 10 mg via INTRAVENOUS

## 2019-05-30 MED ORDER — DEXAMETHASONE SODIUM PHOSPHATE 10 MG/ML IJ SOLN
INTRAMUSCULAR | Status: AC
  Filled 2019-05-30: qty 1

## 2019-05-30 MED ORDER — SODIUM CHLORIDE 0.9 % IV SOLN
90.0000 mg/m2 | Freq: Once | INTRAVENOUS | Status: AC
  Administered 2019-05-30: 175 mg via INTRAVENOUS
  Filled 2019-05-30: qty 7

## 2019-05-30 MED ORDER — PALONOSETRON HCL INJECTION 0.25 MG/5ML
0.2500 mg | Freq: Once | INTRAVENOUS | Status: AC
  Administered 2019-05-30: 13:00:00 0.25 mg via INTRAVENOUS

## 2019-05-30 MED ORDER — DIPHENHYDRAMINE HCL 25 MG PO CAPS
50.0000 mg | ORAL_CAPSULE | Freq: Once | ORAL | Status: AC
  Administered 2019-05-30: 13:00:00 50 mg via ORAL

## 2019-05-30 MED ORDER — ACETAMINOPHEN 325 MG PO TABS
650.0000 mg | ORAL_TABLET | Freq: Once | ORAL | Status: AC
  Administered 2019-05-30: 13:00:00 650 mg via ORAL

## 2019-05-30 MED ORDER — FAMOTIDINE IN NACL 20-0.9 MG/50ML-% IV SOLN
20.0000 mg | Freq: Once | INTRAVENOUS | Status: AC
  Administered 2019-05-30: 20 mg via INTRAVENOUS

## 2019-05-30 MED ORDER — FAMOTIDINE IN NACL 20-0.9 MG/50ML-% IV SOLN
INTRAVENOUS | Status: AC
  Filled 2019-05-30: qty 50

## 2019-05-30 MED ORDER — SODIUM CHLORIDE 0.9 % IV SOLN
Freq: Once | INTRAVENOUS | Status: AC
  Filled 2019-05-30: qty 250

## 2019-05-30 NOTE — Patient Instructions (Addendum)
[facility] Discharge Instructions for Patients Receiving Chemotherapy  Today you received the following chemotherapy agent: Bendeka and Ruxience  To help prevent nausea and vomiting after your treatment, we encourage you to take your nausea medication as directed by your MD.   If you develop nausea and vomiting that is not controlled by your nausea medication, call the clinic.   BELOW ARE SYMPTOMS THAT SHOULD BE REPORTED IMMEDIATELY:  *FEVER GREATER THAN 100.5 F  *CHILLS WITH OR WITHOUT FEVER  NAUSEA AND VOMITING THAT IS NOT CONTROLLED WITH YOUR NAUSEA MEDICATION  *UNUSUAL SHORTNESS OF BREATH  *UNUSUAL BRUISING OR BLEEDING  TENDERNESS IN MOUTH AND THROAT WITH OR WITHOUT PRESENCE OF ULCERS  *URINARY PROBLEMS  *BOWEL PROBLEMS  UNUSUAL RASH Items with * indicate a potential emergency and should be followed up as soon as possible.  Feel free to call the clinic should you have any questions or concerns. The clinic phone number is [phone].  Please show the [place] at check-in to the Emergency Department and triage nurse.  Coronavirus (COVID-19) Are you at risk?  Are you at risk for the Coronavirus (COVID-19)?  To be considered HIGH RISK for Coronavirus (COVID-19), you have to meet the following criteria:  . Traveled to [place], [place], [place], [place] or [place]; or in the [place] to [place], [place], [place], or [place]; and have fever, cough, and shortness of breath within the last 2 weeks of travel OR . Been in close contact with a person diagnosed with COVID-19 within the last 2 weeks and have fever, cough, and shortness of breath . IF YOU DO NOT MEET THESE CRITERIA, YOU ARE CONSIDERED LOW RISK FOR COVID-19.  What to do if you are HIGH RISK for COVID-19?  Marland Kitchen If you are having a medical emergency, call 911. . Seek medical care right away. Before you go to a doctor's office, urgent care or emergency department, call ahead and tell  them about your recent travel, contact with someone diagnosed with COVID-19, and your symptoms. You should receive instructions from your physician's office regarding next steps of care.  . When you arrive at healthcare provider, tell the healthcare staff immediately you have returned from visiting [place], [place], [place], [place] or [place]; or traveled in the [place] to [place], [place], [place], or [place]; in the last two weeks or you have been in close contact with a person diagnosed with COVID-19 in the last 2 weeks.   . Tell the health care staff about your symptoms: fever, cough and shortness of breath. . After you have been seen by a medical provider, you will be either: o Tested for (COVID-19) and discharged home on quarantine except to seek medical care if symptoms worsen, and asked to  - Stay home and avoid contact with others until you get your results (4-5 days)  - Avoid travel on public transportation if possible (such as bus, train, or airplane) or o Sent to the Emergency Department by EMS for evaluation, COVID-19 testing, and possible admission depending on your condition and test results.  What to do if you are LOW RISK for COVID-19?  Reduce your risk of any infection by using the same precautions used for avoiding the common cold or flu:  Marland Kitchen Wash your hands often with soap and warm water for at least 20 seconds.  If soap and water are not readily available, use an alcohol-based hand sanitizer with at least 60% alcohol.  Marland Kitchen  If coughing or sneezing, cover your mouth and nose by coughing or sneezing into the elbow areas of your shirt or coat, into a tissue or into your sleeve (not your hands). . Avoid shaking hands with others and consider head nods or verbal greetings only. . Avoid touching your eyes, nose, or mouth with unwashed hands.  . Avoid close contact with people who are sick. . Avoid places or events with large numbers of people in one location, like concerts or  sporting events. . Carefully consider travel plans you have or are making. . If you are planning any travel outside or inside the [place], visit the CDC's Travelers' Health webpage for the latest health notices. . If you have some symptoms but not all symptoms, continue to monitor at home and seek medical attention if your symptoms worsen. . If you are having a medical emergency, call 911.   [place] / e-Visit: [email or web address]         [facility]: [facility]: [phone]                   [facility]: [phone]

## 2019-05-30 NOTE — Progress Notes (Signed)
HEMATOLOGY/ONCOLOGY CLINIC NOTE  Date of Service: 05/30/2019  Patient Care Team: [name], MD as PCP - General (Internal Medicine)  CHIEF COMPLAINTS/PURPOSE OF CONSULTATION:  Recently diagnosed follicular lymphoma  HISTORY OF PRESENTING ILLNESS:   Meghan Alexander is a wonderful 64 y.o. female who has been referred to [place] by Dr Layne Benton for evaluation and management of possible lymphoproliferative disease.The pt reports that she is doing well overall.  The pt reports that she had an MRI earlier this month because of her back pain. She has had back pain for about 30 years, but in May, it got so bad that she could not move. The pain radiated around her hip and down to her knee. She could not even get in her car to go to the ER. She put herself on bedrest for 5 weeks which improved her symptoms. Now, her back pain is back to baseline.  Pt reported that, in the past 6 months, she has been moving her bowels 3-4x daily, which is much more than normal for her. Throughout the day, her stools get progressively looser. She went to the ER on 10/11/2018 for food poisoning from mayonnaise because she had blood in the stool and cramping. The symptoms began 2 hours after she ate the mayo. She was not placed on antibiotics or any other medications. No stool testing was done. Her symptoms resolved in 2 days.  She experienced night sweats up until 2006, and they returned 6 months ago. She does not take hormone replacements because of her FHx of breast cancer. She also reports some new fatigue, dry eyes, and headaches over the last 6 months. Denies unexpected weight loss. She has not been diagnosed with diabetes.  Of note prior to the patient's visit today, pt has had MRI lumbar spine wo contrast completed on 11/02/2018 with results revealing "Extensive retroperitoneal lymphadenopathy highly worrisome for neoplastic process such as lymphoproliferative disease. 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 There is mild central canal narrowing overall at this Level."  Most recent lab results (10/11/2018) of CBC is as follows: all values are WNL except for Potassium at 3.3, glucose bld at 212. 10/11/2018 Urinalysis revealed hazy appearance 10/11/2018 Lipase at 29  On review of systems, pt reports back pain, 3-4 bowel movements daily, dry eyes, headaches, night sweats, and denies unexpected weight loss, mouth sores, belly pain, changes in urination trouble swallowing, fever, chills, and any other symptoms.   On PMHx the pt reports total hysterectomy at 38 years ago due to ovarian cysts, lymphoreticulosis, sleep apnea related surgery, breast augmentation with no removal of breast tissue On Social Hx the pt reports never smoking On Family Hx the pt reports breast cancer on both sides of the family.   INTERVAL HISTORY  Meghan Alexander is a 64 y.o. female presenting today for the management and evaluation of her follicular lymphoma. Pt is here for f/u for C2 of Bendamustine + Rituxan. The patient's last visit with [place] was on 05/02/2019. The pt reports that she is doing well overall.  The pt reports she is no longer taking biotin supplements.  The highest she has seen her pulse is 116  She spent an hour coughing before going to bed. It felt as if something was stuck in her throat.    Of note since the patient's last visit,  pt has had CT ANGIO CHEST PE W OR WO CONTRAST (Accession [id number]) completed on 05/14/2019 with results revealing "1. No CT findings for pulmonary embolism.2. Normal thoracic aorta. 3. No acute pulmonary findings.4. Diffuse distention of the esophagus. Could not exclude achalasia."  Lab results today (05/30/19) of CBC w/diff and CMP is as follows: all values are WNL except for Glucose Bld at 250.  On review of  systems, pt reports coughing and denies swallowing issues, GERD, abdominal pain, leg swelling and any other symptoms.    MEDICAL HISTORY:  Past Medical History:  Diagnosis Date  . Allergy    year around allergies  . Arthritis    fingers, knees, neck, back-osteoarthristis  . Bronchitis    hx of  . Complication of anesthesia   . 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 GERD (gastroesophageal reflux disease)    hx of reflux-went away with elevating HOB   . Hypertension   . Pneumonia    hx of   . PONV (postoperative nausea and vomiting)     SURGICAL HISTORY: Past Surgical History:  Procedure Laterality Date  . ABDOMINAL HYSTERECTOMY    . ADENOIDECTOMY    . cosmetic surgeries    . ESOPHAGEAL MANOMETRY N/A 12/02/2015   Procedure: ESOPHAGEAL MANOMETRY (EM);  Surgeon: [name], MD;  Location: [facility];  Service: Endoscopy;  Laterality: N/A;  . ESOPHAGOGASTRODUODENOSCOPY N/A 12/02/2015   Procedure: ESOPHAGOGASTRODUODENOSCOPY (EGD);  Surgeon: [name], MD;  Location: [facility];  Service: Endoscopy;  Laterality: N/A;  . lymph node removal left leg     cat scratch surgery   . THROAT SURGERY     sleep apnea surgery   . TONSILLECTOMY      SOCIAL HISTORY: Social History   Socioeconomic History  . Marital status: Married    Spouse name: Not on file  . Number of children: Not on file  . Years of education: Not on file  . Highest education level: Not on file  Occupational History  . Not on file  Tobacco Use  . Smoking status: Never Smoker  . Smokeless tobacco: Never Used  Substance and Sexual Activity  . Alcohol use: No    Alcohol/week: 0.0 standard drinks  . Drug use: No  . Sexual activity: Not on file  Other Topics Concern  . 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 Difficulty of Paying Living Expenses: Not on file  Food Insecurity:   . Worried About Charity fundraiser in the Last Year: Not on file  . Ran Out of Food in the Last Year: Not on file  Transportation Needs:   . Lack of Transportation (Medical): Not on file  . Lack of Transportation (Non-Medical): Not on file  Physical Activity:   . Days of Exercise per Week: Not on file  . Minutes of Exercise per Session: Not on file  Stress:   . Feeling of Stress : Not on file  Social Connections:   . Frequency of Communication with Friends and Family: Not on file  . Frequency of Social Gatherings with Friends and Family: Not on file  . Attends Religious Services: Not on  file  . Active Member of Clubs or Organizations: Not on file  . Attends Archivist Meetings: Not on file  . Marital Status: Not on file  Intimate Partner Violence:   . Fear of Current or Ex-Partner: Not on file  . Emotionally Abused: Not on file  . Physically Abused: Not on file  . Sexually Abused: Not on file    FAMILY HISTORY: Family History  Problem Relation Age of Onset  . Lung cancer Father        1982 died from it  . Hernia Mother   . Breast cancer Maternal Aunt        multiple   . Breast cancer Maternal Grandmother   . Breast cancer Paternal Grandmother   . Breast cancer Cousin        cousins multiple   . Colon cancer Neg Hx   . Colon polyps Neg Hx   . Rectal cancer Neg Hx   . Stomach cancer Neg Hx     ALLERGIES:  is allergic to latex; codeine; other; hydrocodone; and oxycodone.  MEDICATIONS:  Current Outpatient Medications  Medication Sig Dispense Refill  . acyclovir (ZOVIRAX) 400 MG tablet Take 1 tablet (400 mg total) by mouth daily. (Patient not taking: Reported on 05/20/2019) 30 tablet 6  . aspirin EC 81 MG tablet Take 81 mg by mouth daily.    Marland Kitchen dexamethasone (DECADRON) 4 MG tablet  2 tabs (8mg ) po daily for 3 days starting the day after completing each chemotherapy 30 tablet 0  . diphenhydrAMINE (BENADRYL) 25 MG tablet Take 25 mg by mouth at bedtime.    . Lactobacillus-Inulin (CULTURELLE DIGESTIVE HEALTH PO) Take 1 capsule by mouth at bedtime.    Marland Kitchen lisinopril-hydrochlorothiazide (ZESTORETIC) 10-12.5 MG tablet TK 1 T PO QD    . LORazepam (ATIVAN) 0.5 MG tablet TAKE 1 TABLET(0.5 MG) BY MOUTH EVERY 8 HOURS 30 tablet 0  . Melatonin 10 MG CAPS Take 10 mg by mouth at bedtime.     . meloxicam (MOBIC) 15 MG tablet Take 15 mg by mouth daily.    . metoprolol succinate (TOPROL-XL) 50 MG 24 hr tablet Take 50 mg by mouth daily.    . Multiple Vitamin (MULTIVITAMIN) tablet Take 1 tablet by mouth daily.    . ondansetron (ZOFRAN) 8 MG tablet Take 1 tablet (8 mg total) by mouth every 8 (eight) hours as needed for nausea. Starting 2 days after each chemotherapy 30 tablet 3  . predniSONE (DELTASONE) 10 MG tablet Take 10 mg by mouth 2 (two) times daily.    . prochlorperazine (COMPAZINE) 10 MG tablet Take 1 tablet (10 mg total) by mouth every 6 (six) hours as needed for nausea or vomiting. 30 tablet 0  . rosuvastatin (CRESTOR) 10 MG tablet Take 10 mg by mouth daily.    Marland Kitchen venlafaxine (EFFEXOR) 37.5 MG tablet Take 37.5 mg by mouth once.     No current facility-administered medications for this visit.    REVIEW OF SYSTEMS: A 10+ POINT REVIEW OF SYSTEMS WAS OBTAINED including neurology, dermatology, psychiatry, cardiac, respiratory, lymph, extremities, GI, GU, Musculoskeletal, constitutional, breasts, reproductive, HEENT.  All pertinent positives are noted in the HPI.  All others are negative.    PHYSICAL EXAMINATION: ECOG FS:1 - Symptomatic but completely ambulatory  Vitals:   05/30/19 1054  BP: (!) 161/95  Pulse: 96  Resp: 18  Temp: 97.8 F (36.6 C)  SpO2: 97%   Wt Readings from Last 3 Encounters:  05/30/19 195 lb 12.8  oz (88.8 kg)  05/20/19 196 lb 3.2 oz (89 kg)  05/02/19 195 lb  12.8 oz (88.8 kg)   Body mass index is 35.81 kg/m.    GENERAL:alert, in no acute distress and comfortable SKIN: no acute rashes, no significant lesions EYES: conjunctiva are pink and non-injected, sclera anicteric OROPHARYNX: MMM, no exudates, no oropharyngeal erythema or ulceration NECK: supple, no JVD LYMPH:  no palpable lymphadenopathy in the cervical, axillary or inguinal regions LUNGS: clear to auscultation b/l with normal respiratory effort HEART: regular rate & rhythm ABDOMEN:  normoactive bowel sounds , non tender, not distended. Extremity: no pedal edema PSYCH: alert & oriented x 3 with fluent speech NEURO: no focal motor/sensory deficits    LABORATORY DATA:  I have reviewed the data as listed  . CBC Latest Ref Rng & Units 05/30/2019 05/02/2019 04/15/2019  WBC 4.0 - 10.5 K/uL 4.6 6.1 6.8  Hemoglobin 12.0 - 15.0 g/dL 13.4 13.1 15.4(H)  Hematocrit 36.0 - 46.0 % 37.8 36.3 45.1  Platelets 150 - 400 K/uL 167 159 197    . CMP Latest Ref Rng & Units 05/30/2019 05/14/2019 05/02/2019  Glucose 70 - 99 mg/dL 250(H) 200(H) 245(H)  BUN 8 - 23 mg/dL 11 13 14   Creatinine 0.44 - 1.00 mg/dL 0.71 0.74 0.80  Sodium 135 - 145 mmol/L 138 139 139  Potassium 3.5 - 5.1 mmol/L 3.6 4.1 4.2  Chloride 98 - 111 mmol/L 98 99 99  CO2 22 - 32 mmol/L 27 28 27   Calcium 8.9 - 10.3 mg/dL 9.4 9.2 9.0  Total Protein 6.5 - 8.1 g/dL 6.7 6.5 6.9  Total Bilirubin 0.3 - 1.2 mg/dL 0.4 0.3 0.4  Alkaline Phos 38 - 126 U/L 49 57 73  AST 15 - 41 U/L 35 19 20  ALT 0 - 44 U/L 37 30 30   . Lab Results  Component Value Date   LDH 234 (H) 04/15/2019   05/14/2019 CT ANGIO CHEST PE W OR WO CONTRAST (Accession [id number]):   03/08/2019 Lymph Node Biopsy [phone]):      RADIOGRAPHIC STUDIES: I have personally reviewed the radiological images as listed and agreed with the findings in the report. CT ANGIO CHEST PE W OR WO CONTRAST  Result Date: 05/14/2019 CLINICAL DATA:  Shortness of breath and elevated  D-dimer. History of lymphoma EXAM: CT ANGIOGRAPHY CHEST WITH CONTRAST TECHNIQUE: Multidetector CT imaging of the chest was performed using the standard protocol during bolus administration of intravenous contrast. Multiplanar CT image reconstructions and MIPs were obtained to evaluate the vascular anatomy. CONTRAST:  171mL OMNIPAQUE IOHEXOL 350 MG/ML SOLN COMPARISON:  PET-CT 01/27/2019 FINDINGS: Cardiovascular: The heart is normal in size. No pericardial effusion. There is mild tortuosity of the thoracic aorta. Minimal scattered atherosclerotic calcifications but no dissection. The branch vessels are patent. The pulmonary arterial tree is well opacified. No filling defects to suggest pulmonary embolism. Mediastinum/Nodes: No enlarged mediastinal or hilar lymph nodes are identified. Upper left mediastinal lymph node on image 19/4 measures 4 mm. This previously measured 8 mm and was hypermetabolic on the PET CT scan. Diffuse distention of the esophagus. Could not exclude achalasia. No esophageal mass. Lungs/Pleura: No acute pulmonary findings. No infiltrates or effusions. Minimal dependent subpleural atelectasis noted. No worrisome pulmonary lesions. Upper Abdomen: Diffuse fatty infiltration of the liver noted. Calcified hepatic and splenic granulomas. Musculoskeletal: Bilateral breast prosthesis. No axillary or supraclavicular adenopathy. The bony thorax is intact. Review of the MIP images confirms the above findings. IMPRESSION: 1. No CT findings for pulmonary  embolism. 2. Normal thoracic aorta. 3. No acute pulmonary findings. 4. Diffuse distention of the esophagus. Could not exclude achalasia. Aortic Atherosclerosis (ICD10-I70.0). Electronically Signed   By: Marijo Sanes M.D.   On: 05/14/2019 15:53    ASSESSMENT & PLAN:   #1 Retroperitoneal lymphadenopathy -  Newly Diagnosed follicular lymphoma - likely low grade but accurate grading difficult  11/02/2018 MRI lumbar spine wo contrast revealed "Extensive  retroperitoneal lymphadenopathy highly worrisome for neoplastic process such as lymphoproliferative disease. Spondylosis worst at L4-5 where there is narrowing in the right subarticular recess predominantly due to a synovial cyst off the medial margin of the right facet with encroachment on the descending right L5 root. There is mild central canal narrowing overall at this Level."  12/04/2018 CT CAP revealed "Mild-to-moderate abdominal and pelvic lymphadenopathy and new 9 mm left superior mediastinal lymph node, highly suspicious for lymphoproliferative disorder. Stable 8 mm lingular pulmonary nodule, consistent with benign Etiology. Moderate hepatic steatosis. 1.3 cm indeterminate low-attenuation lesion in the right lobe. Recommend continued attention on follow-up CT."  12/18/2018 lymph node biopsy pathology revealing a follicular lymphoma- likely low grade but accurate grading not possible on limited sampling.  02/06/2019 PET/CT scan ([id number]) revealed "1. Bulky hypermetabolic lymphadenopathy in the abdomen and upper pelvis, as described. This is associated with mild hypermetabolic lymphadenopathy in the upper mediastinum and right axilla. Hypermetabolic lymphadenopathy represents a combination of Deauville 4 and Deauville 5 disease. 2. 8 mm nodule in the lingula without hypermetabolism. Attention on follow-up recommended. 3. Hepatic steatosis. 4.  Aortic Atherosclerois (ICD10-170.0)."  03/08/2019 Lymph Node Biopsy ([id number]) revealed "LYMPH NODE, NEEDLE CORE BIOPSY: - Follicular lymphoma (grade 1-2 of 3) with elevated proliferation rate."   PLAN: -Discussed pt labwork today, 05/30/19; all values are WNL except for Glucose Bld at 250. -Discussed CT ANGIO CHEST PE W OR WO CONTRAST (Accession [id number]) completed on 05/14/2019 with results revealing "1. No CT findings for pulmonary embolism.2. Normal thoracic aorta. 3. No acute pulmonary findings.4. Diffuse distention of the esophagus. Could  not exclude achalasia." -Advised that she is in C3 of Bendamustine/Rituxan -Discussed the possible causes of increased heart rate. -Recommended to view FitBit less and start eating smaller meals but more frequently -I do not see any suggestion of heart issues -Reviewed current medication list. She is taking lisinopril and metoprolol -Advised that lisinopril can cause a dry cough. Recommended following up with PCP to determine if medication should be altered. -Discussed that Venlafaxine could contribute to anxiety symptoms. Recommended that she follow up with PCP to determine if medication should be switched or altered. -Discussed the option for a port a cath. Advised that one is not necessary at this time. -Will repeat PET scan in 3 weeks -Will see back in 4 weeks   FOLLOW UP: PET/CT in 3 weeks Please schedule next cycle of BR in 4 weeks with labs and MD visit   The total time spent in the appt was 30 minutes and more than 50% was on counseling and direct patient cares.  All of the patient's questions were answered with apparent satisfaction. The patient knows to call the clinic with any problems, questions or concerns.    Sullivan Lone MD MS AAHIVMS [facility] Hematology/Oncology Physician [facility]  (Office):       [phone] (Work cell):  [phone] (Fax):           [phone]  05/30/2019 9:38 AM I, Scot Dock, am acting as a scribe for Dr. Sullivan Lone.   Marland KitchenI  have reviewed the above documentation for accuracy and completeness, and I agree with the above. Brunetta Genera MD

## 2019-05-31 ENCOUNTER — Inpatient Hospital Stay: Payer: Medicare Other

## 2019-05-31 ENCOUNTER — Other Ambulatory Visit: Payer: Self-pay

## 2019-05-31 VITALS — BP 161/92 | HR 100 | Temp 98.2°F | Resp 16

## 2019-05-31 DIAGNOSIS — Z7189 Other specified counseling: Secondary | ICD-10-CM

## 2019-05-31 DIAGNOSIS — Z5112 Encounter for antineoplastic immunotherapy: Secondary | ICD-10-CM | POA: Diagnosis not present

## 2019-05-31 DIAGNOSIS — C8218 Follicular lymphoma grade II, lymph nodes of multiple sites: Secondary | ICD-10-CM

## 2019-05-31 MED ORDER — SODIUM CHLORIDE 0.9 % IV SOLN
Freq: Once | INTRAVENOUS | Status: AC
  Filled 2019-05-31: qty 250

## 2019-05-31 MED ORDER — SODIUM CHLORIDE 0.9 % IV SOLN
90.0000 mg/m2 | Freq: Once | INTRAVENOUS | Status: AC
  Administered 2019-05-31: 11:00:00 175 mg via INTRAVENOUS
  Filled 2019-05-31: qty 7

## 2019-05-31 MED ORDER — DEXAMETHASONE SODIUM PHOSPHATE 10 MG/ML IJ SOLN
INTRAMUSCULAR | Status: AC
  Filled 2019-05-31: qty 1

## 2019-05-31 MED ORDER — DEXAMETHASONE SODIUM PHOSPHATE 10 MG/ML IJ SOLN
10.0000 mg | Freq: Once | INTRAMUSCULAR | Status: AC
  Administered 2019-05-31: 10 mg via INTRAVENOUS

## 2019-05-31 NOTE — Patient Instructions (Signed)
[facility] Discharge Instructions for Patients Receiving Chemotherapy  Today you received the following chemotherapy agents: bendamustine.  To help prevent nausea and vomiting after your treatment, we encourage you to take your nausea medication as directed.   If you develop nausea and vomiting that is not controlled by your nausea medication, call the clinic.   BELOW ARE SYMPTOMS THAT SHOULD BE REPORTED IMMEDIATELY:  *FEVER GREATER THAN 100.5 F  *CHILLS WITH OR WITHOUT FEVER  NAUSEA AND VOMITING THAT IS NOT CONTROLLED WITH YOUR NAUSEA MEDICATION  *UNUSUAL SHORTNESS OF BREATH  *UNUSUAL BRUISING OR BLEEDING  TENDERNESS IN MOUTH AND THROAT WITH OR WITHOUT PRESENCE OF ULCERS  *URINARY PROBLEMS  *BOWEL PROBLEMS  UNUSUAL RASH Items with * indicate a potential emergency and should be followed up as soon as possible.  Feel free to call the clinic should you have any questions or concerns. The clinic phone number is [phone].  Please show the CHEMO ALERT CARD at check-in to the Emergency Department and triage nurse.   

## 2019-06-26 NOTE — Progress Notes (Signed)
HEMATOLOGY/ONCOLOGY CLINIC NOTE  Date of Service: 06/26/2019  Patient Care Team: [name], MD as PCP - General (Internal Medicine)  CHIEF COMPLAINTS/PURPOSE OF CONSULTATION:  Recently diagnosed follicular lymphoma  HISTORY OF PRESENTING ILLNESS:   Meghan Alexander is a wonderful 64 y.o. female who has been referred to [place] by Dr Layne Benton for evaluation and management of possible lymphoproliferative disease.The pt reports that she is doing well overall.  The pt reports that she had an MRI earlier this month because of her back pain. She has had back pain for about 30 years, but in May, it got so bad that she could not move. The pain radiated around her hip and down to her knee. She could not even get in her car to go to the ER. She put herself on bedrest for 5 weeks which improved her symptoms. Now, her back pain is back to baseline.  Pt reported that, in the past 6 months, she has been moving her bowels 3-4x daily, which is much more than normal for her. Throughout the day, her stools get progressively looser. She went to the ER on 10/11/2018 for food poisoning from mayonnaise because she had blood in the stool and cramping. The symptoms began 2 hours after she ate the mayo. She was not placed on antibiotics or any other medications. No stool testing was done. Her symptoms resolved in 2 days.  She experienced night sweats up until 2006, and they returned 6 months ago. She does not take hormone replacements because of her FHx of breast cancer. She also reports some new fatigue, dry eyes, and headaches over the last 6 months. Denies unexpected weight loss. She has not been diagnosed with diabetes.  Of note prior to the patient's visit today, pt has had MRI lumbar spine wo contrast completed on 11/02/2018 with results revealing "Extensive retroperitoneal lymphadenopathy highly worrisome for neoplastic process such as lymphoproliferative disease. 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 There is mild central canal narrowing overall at this Level."  Most recent lab results (10/11/2018) of CBC is as follows: all values are WNL except for Potassium at 3.3, glucose bld at 212. 10/11/2018 Urinalysis revealed hazy appearance 10/11/2018 Lipase at 29  On review of systems, pt reports back pain, 3-4 bowel movements daily, dry eyes, headaches, night sweats, and denies unexpected weight loss, mouth sores, belly pain, changes in urination trouble swallowing, fever, chills, and any other symptoms.   On PMHx the pt reports total hysterectomy at 38 years ago due to ovarian cysts, lymphoreticulosis, sleep apnea related surgery, breast augmentation with no removal of breast tissue On Social Hx the pt reports never smoking On Family Hx the pt reports breast cancer on both sides of the family.   INTERVAL HISTORY  Meghan Alexander is a 64 y.o. female presenting today for the management and evaluation of her follicular lymphoma. The patient's last visit with [place] was on 05/30/2019. The pt reports that she is doing well overall.  The pt reports she is feeling about the same. Lisinopril and Metoprolol has been increased. She reports her heart rate has decreases and she has not been checking FitBit as often. Pt is still having a cough. She is still fatigued but sleeping through the night. Pt has gotten first dose of COVID19 vaccine and has second dose scheduled.  Lab results today (06/27/19) of CBC w/diff and CMP is as follows: all values are WNL except for WBC at 2.5K, MCHC at 36.4, Neutro Abs at 1.2K, Lymphs Abs at 0.4K, Abs Immature Granulocytes at 0.11K, Glucose at 262. (06/27/2019) TSH is WNL No other acute new concerns  On review of systems, pt reports cough, , mild grade 1 fatigue, minimal nausea, related to anxiety,  healthy  appetite, mouth sore, night sweats and denies fever, chills, abdominal pain and any other symptoms.      MEDICAL HISTORY:  Past Medical History:  Diagnosis Date  . Allergy    year around allergies  . Arthritis    fingers, knees, neck, back-osteoarthristis  . Bronchitis    hx of  . Complication of anesthesia   . 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 GERD (gastroesophageal reflux disease)    hx of reflux-went away with elevating HOB   . Hypertension   . Pneumonia    hx of   . PONV (postoperative nausea and vomiting)     SURGICAL HISTORY: Past Surgical History:  Procedure Laterality Date  . ABDOMINAL HYSTERECTOMY    . ADENOIDECTOMY    . cosmetic surgeries    . ESOPHAGEAL MANOMETRY N/A 12/02/2015   Procedure: ESOPHAGEAL MANOMETRY (EM);  Surgeon: [name], MD;  Location: [facility];  Service: Endoscopy;  Laterality: N/A;  . ESOPHAGOGASTRODUODENOSCOPY N/A 12/02/2015   Procedure: ESOPHAGOGASTRODUODENOSCOPY (EGD);  Surgeon: [name], MD;  Location: [facility];  Service: Endoscopy;  Laterality: N/A;  . lymph node removal left leg     cat scratch surgery   . THROAT SURGERY     sleep apnea surgery   . TONSILLECTOMY      SOCIAL HISTORY: Social History   Socioeconomic History  . Marital status: Married    Spouse name: Not on file  . Number of children: Not on file  . Years of education: Not on file  . Highest education level: Not on file  Occupational History  . Not on file  Tobacco Use  . Smoking status: Never Smoker  . Smokeless tobacco: Never Used  Substance and Sexual Activity  . Alcohol use: No    Alcohol/week: 0.0 standard drinks  . Drug use: No  . Sexual activity: Not on file  Other Topics Concern  . 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 Difficulty of Paying Living Expenses: Not on file  Food Insecurity:   . Worried About Charity fundraiser in the Last Year: Not on file  . Ran Out of Food in the Last Year: Not on file  Transportation Needs:   . Lack of Transportation (Medical): Not on file  . Lack of Transportation (Non-Medical): Not on file  Physical Activity:   . Days of Exercise per Week: Not on file  . Minutes of Exercise per Session: Not on file  Stress:   . Feeling of Stress : Not on file  Social Connections:   . Frequency of Communication with Friends and Family: Not on file  . Frequency of Social Gatherings with Friends and Family: Not on file  . Attends Religious Services: Not on file  .  Active Member of Clubs or Organizations: Not on file  . Attends Archivist Meetings: Not on file  . Marital Status: Not on file  Intimate Partner Violence:   . Fear of Current or Ex-Partner: Not on file  . Emotionally Abused: Not on file  . Physically Abused: Not on file  . Sexually Abused: Not on file    FAMILY HISTORY: Family History  Problem Relation Age of Onset  . Lung cancer Father        1982 died from it  . Hernia Mother   . Breast cancer Maternal Aunt        multiple   . Breast cancer Maternal Grandmother   . Breast cancer Paternal Grandmother   . Breast cancer Cousin        cousins multiple   . Colon cancer Neg Hx   . Colon polyps Neg Hx   . Rectal cancer Neg Hx   . Stomach cancer Neg Hx     ALLERGIES:  is allergic to latex; codeine; other; hydrocodone; and oxycodone.  MEDICATIONS:  Current Outpatient Medications  Medication Sig Dispense Refill  . acyclovir (ZOVIRAX) 400 MG tablet Take 1 tablet (400 mg total) by mouth daily. (Patient not taking: Reported on 05/20/2019) 30 tablet 6  . aspirin EC 81 MG tablet Take 81 mg by mouth daily.    Marland Kitchen dexamethasone (DECADRON) 4 MG tablet 2 tabs (8mg )  po daily for 3 days starting the day after completing each chemotherapy 30 tablet 0  . diphenhydrAMINE (BENADRYL) 25 MG tablet Take 25 mg by mouth at bedtime.    . Lactobacillus-Inulin (CULTURELLE DIGESTIVE HEALTH PO) Take 1 capsule by mouth at bedtime.    Marland Kitchen lisinopril-hydrochlorothiazide (ZESTORETIC) 10-12.5 MG tablet TK 1 T PO QD    . LORazepam (ATIVAN) 0.5 MG tablet TAKE 1 TABLET(0.5 MG) BY MOUTH EVERY 8 HOURS 30 tablet 0  . Melatonin 10 MG CAPS Take 10 mg by mouth at bedtime.     . meloxicam (MOBIC) 15 MG tablet Take 15 mg by mouth daily.    . metoprolol succinate (TOPROL-XL) 50 MG 24 hr tablet Take 50 mg by mouth daily.    . Multiple Vitamin (MULTIVITAMIN) tablet Take 1 tablet by mouth daily.    . ondansetron (ZOFRAN) 8 MG tablet Take 1 tablet (8 mg total) by mouth every 8 (eight) hours as needed for nausea. Starting 2 days after each chemotherapy 30 tablet 3  . predniSONE (DELTASONE) 10 MG tablet Take 10 mg by mouth 2 (two) times daily.    . prochlorperazine (COMPAZINE) 10 MG tablet Take 1 tablet (10 mg total) by mouth every 6 (six) hours as needed for nausea or vomiting. 30 tablet 0  . rosuvastatin (CRESTOR) 10 MG tablet Take 10 mg by mouth daily.    Marland Kitchen venlafaxine (EFFEXOR) 37.5 MG tablet Take 37.5 mg by mouth once.     No current facility-administered medications for this visit.    REVIEW OF SYSTEMS: A 10+ POINT REVIEW OF SYSTEMS WAS OBTAINED including neurology, dermatology, psychiatry, cardiac, respiratory, lymph, extremities, GI, GU, Musculoskeletal, constitutional, breasts, reproductive, HEENT.  All pertinent positives are noted in the HPI.  All others are negative.    PHYSICAL EXAMINATION: ECOG FS:1 - Symptomatic but completely ambulatory  There were no vitals filed for this visit. Wt Readings from Last 3 Encounters:  05/30/19 195 lb 12.8 oz (88.8 kg)  05/20/19 196 lb 3.2 oz (89 kg)  05/02/19 195 lb 12.8 oz (88.8 kg)  Body mass index is 35.81 kg/m.    GENERAL:alert, in  no acute distress and comfortable SKIN: no acute rashes, no significant lesions EYES: conjunctiva are pink and non-injected, sclera anicteric OROPHARYNX: MMM, no exudates, no oropharyngeal erythema or ulceration NECK: supple, no JVD LYMPH:  no palpable lymphadenopathy in the cervical, axillary or inguinal regions LUNGS: clear to auscultation b/l with normal respiratory effort HEART: regular rate & rhythm ABDOMEN:  normoactive bowel sounds , non tender, not distended. Extremity: no pedal edema PSYCH: alert & oriented x 3 with fluent speech NEURO: no focal motor/sensory deficits    LABORATORY DATA:  I have reviewed the data as listed  . CBC Latest Ref Rng & Units 06/27/2019 05/30/2019 05/02/2019  WBC 4.0 - 10.5 K/uL 2.5(L) 4.6 6.1  Hemoglobin 12.0 - 15.0 g/dL 13.3 13.4 13.1  Hematocrit 36.0 - 46.0 % 36.5 37.8 36.3  Platelets 150 - 400 K/uL 163 167 159    . CMP Latest Ref Rng & Units 06/27/2019 05/30/2019 05/14/2019  Glucose 70 - 99 mg/dL 262(H) 250(H) 200(H)  BUN 8 - 23 mg/dL 12 11 13   Creatinine 0.44 - 1.00 mg/dL 0.78 0.71 0.74  Sodium 135 - 145 mmol/L 142 138 139  Potassium 3.5 - 5.1 mmol/L 3.6 3.6 4.1  Chloride 98 - 111 mmol/L 101 98 99  CO2 22 - 32 mmol/L 28 27 28   Calcium 8.9 - 10.3 mg/dL 9.2 9.4 9.2  Total Protein 6.5 - 8.1 g/dL 6.9 6.7 6.5  Total Bilirubin 0.3 - 1.2 mg/dL 0.5 0.4 0.3  Alkaline Phos 38 - 126 U/L 60 49 57  AST 15 - 41 U/L 29 35 19  ALT 0 - 44 U/L 42 37 30   . Lab Results  Component Value Date   LDH 234 (H) 04/15/2019   05/14/2019 CT ANGIO CHEST PE W OR WO CONTRAST (Accession [id number]):   03/08/2019 Lymph Node Biopsy [phone]):      RADIOGRAPHIC STUDIES: I have personally reviewed the radiological images as listed and agreed with the findings in the report. No results found.  ASSESSMENT & PLAN:   #1 Stage III/IV follicular lymphoma - likely low grade but accurate grading difficult  11/02/2018 MRI lumbar spine wo contrast revealed  "Extensive retroperitoneal lymphadenopathy highly worrisome for neoplastic process such as lymphoproliferative disease. Spondylosis worst at L4-5 where there is narrowing in the right subarticular recess predominantly due to a synovial cyst off the medial margin of the right facet with encroachment on the descending right L5 root. There is mild central canal narrowing overall at this Level."  12/04/2018 CT CAP revealed "Mild-to-moderate abdominal and pelvic lymphadenopathy and new 9 mm left superior mediastinal lymph node, highly suspicious for lymphoproliferative disorder. Stable 8 mm lingular pulmonary nodule, consistent with benign Etiology. Moderate hepatic steatosis. 1.3 cm indeterminate low-attenuation lesion in the right lobe. Recommend continued attention on follow-up CT."  12/18/2018 lymph node biopsy pathology revealing a follicular lymphoma- likely low grade but accurate grading not possible on limited sampling.  02/06/2019 PET/CT scan ([id number]) revealed "1. Bulky hypermetabolic lymphadenopathy in the abdomen and upper pelvis, as described. This is associated with mild hypermetabolic lymphadenopathy in the upper mediastinum and right axilla. Hypermetabolic lymphadenopathy represents a combination of Deauville 4 and Deauville 5 disease. 2. 8 mm nodule in the lingula without hypermetabolism. Attention on follow-up recommended. 3. Hepatic steatosis. 4.  Aortic Atherosclerois (ICD10-170.0)."  03/08/2019 Lymph Node Biopsy ([id number]) revealed "LYMPH NODE, NEEDLE CORE BIOPSY: - Follicular lymphoma (grade 1-2 of 3) with elevated proliferation rate."  2. Mild treatment related thrombocytopenia PLAN: -Discussed pt labwork today, 06/27/19 -patient with no prohibitive toxicities from BR at this time. [place] 1.2k -will need monitoring and consideration of G-CSF with subsequent cycles -okay to proceed with C4 of BR treatment -Advised talking to PCP about cough and switching Lisinopril possibly to  losartan -Advised on getting PET scan  -Advised that lisinopril can cause a dry cough. Recommended following up with PCP to determine if medication should be altered. -chemotherapy orders reviewed and signed    FOLLOW UP: PET/CT in 1 week Please schedule C5 of BR treatment with labs and MD visit on C5D1   All of the patient's questions were answered with apparent satisfaction. The patient knows to call the clinic with any problems, questions or concerns.    Sullivan Lone MD [place] AAHIVMS [facility] Hematology/Oncology Physician [facility]  (Office):       [phone] (Work cell):  [phone] (Fax):           [phone]  06/26/2019 10:23 AM I, Dawayne Cirri am acting as a Education administrator for Dr. Sullivan Lone.   .I have reviewed the above documentation for accuracy and completeness, and I agree with the above. Brunetta Genera MD

## 2019-06-27 ENCOUNTER — Other Ambulatory Visit: Payer: Self-pay

## 2019-06-27 ENCOUNTER — Inpatient Hospital Stay: Payer: Medicare Other | Admitting: Hematology

## 2019-06-27 ENCOUNTER — Inpatient Hospital Stay: Payer: Medicare Other | Attending: Hematology

## 2019-06-27 ENCOUNTER — Inpatient Hospital Stay: Payer: Medicare Other

## 2019-06-27 VITALS — BP 141/83 | HR 90 | Temp 98.0°F | Resp 18 | Ht 62.0 in | Wt 195.8 lb

## 2019-06-27 VITALS — BP 128/73 | HR 81 | Temp 98.4°F | Resp 17

## 2019-06-27 DIAGNOSIS — Z8379 Family history of other diseases of the digestive system: Secondary | ICD-10-CM | POA: Diagnosis not present

## 2019-06-27 DIAGNOSIS — R5383 Other fatigue: Secondary | ICD-10-CM | POA: Diagnosis not present

## 2019-06-27 DIAGNOSIS — Z5112 Encounter for antineoplastic immunotherapy: Secondary | ICD-10-CM | POA: Diagnosis present

## 2019-06-27 DIAGNOSIS — Z801 Family history of malignant neoplasm of trachea, bronchus and lung: Secondary | ICD-10-CM | POA: Insufficient documentation

## 2019-06-27 DIAGNOSIS — E0590 Thyrotoxicosis, unspecified without thyrotoxic crisis or storm: Secondary | ICD-10-CM

## 2019-06-27 DIAGNOSIS — C8298 Follicular lymphoma, unspecified, lymph nodes of multiple sites: Secondary | ICD-10-CM

## 2019-06-27 DIAGNOSIS — Z5111 Encounter for antineoplastic chemotherapy: Secondary | ICD-10-CM | POA: Insufficient documentation

## 2019-06-27 DIAGNOSIS — C8228 Follicular lymphoma grade III, unspecified, lymph nodes of multiple sites: Secondary | ICD-10-CM | POA: Insufficient documentation

## 2019-06-27 DIAGNOSIS — Z79899 Other long term (current) drug therapy: Secondary | ICD-10-CM | POA: Insufficient documentation

## 2019-06-27 DIAGNOSIS — C8218 Follicular lymphoma grade II, lymph nodes of multiple sites: Secondary | ICD-10-CM

## 2019-06-27 DIAGNOSIS — R05 Cough: Secondary | ICD-10-CM | POA: Diagnosis not present

## 2019-06-27 DIAGNOSIS — Z7189 Other specified counseling: Secondary | ICD-10-CM

## 2019-06-27 DIAGNOSIS — D696 Thrombocytopenia, unspecified: Secondary | ICD-10-CM | POA: Diagnosis not present

## 2019-06-27 DIAGNOSIS — Z803 Family history of malignant neoplasm of breast: Secondary | ICD-10-CM | POA: Diagnosis not present

## 2019-06-27 LAB — CBC WITH DIFFERENTIAL/PLATELET
Abs Immature Granulocytes: 0.11 10*3/uL — ABNORMAL HIGH (ref 0.00–0.07)
Basophils Absolute: 0 10*3/uL (ref 0.0–0.1)
Basophils Relative: 2 %
Eosinophils Absolute: 0.2 10*3/uL (ref 0.0–0.5)
Eosinophils Relative: 7 %
HCT: 36.5 % (ref 36.0–46.0)
Hemoglobin: 13.3 g/dL (ref 12.0–15.0)
Immature Granulocytes: 4 %
Lymphocytes Relative: 14 %
Lymphs Abs: 0.4 10*3/uL — ABNORMAL LOW (ref 0.7–4.0)
MCH: 31.7 pg (ref 26.0–34.0)
MCHC: 36.4 g/dL — ABNORMAL HIGH (ref 30.0–36.0)
MCV: 86.9 fL (ref 80.0–100.0)
Monocytes Absolute: 0.7 10*3/uL (ref 0.1–1.0)
Monocytes Relative: 27 %
Neutro Abs: 1.2 10*3/uL — ABNORMAL LOW (ref 1.7–7.7)
Neutrophils Relative %: 46 %
Platelets: 163 10*3/uL (ref 150–400)
RBC: 4.2 MIL/uL (ref 3.87–5.11)
RDW: 13.4 % (ref 11.5–15.5)
WBC: 2.5 10*3/uL — ABNORMAL LOW (ref 4.0–10.5)
nRBC: 0 % (ref 0.0–0.2)

## 2019-06-27 LAB — CMP (CANCER CENTER ONLY)
ALT: 42 U/L (ref 0–44)
AST: 29 U/L (ref 15–41)
Albumin: 4.1 g/dL (ref 3.5–5.0)
Alkaline Phosphatase: 60 U/L (ref 38–126)
Anion gap: 13 (ref 5–15)
BUN: 12 mg/dL (ref 8–23)
CO2: 28 mmol/L (ref 22–32)
Calcium: 9.2 mg/dL (ref 8.9–10.3)
Chloride: 101 mmol/L (ref 98–111)
Creatinine: 0.78 mg/dL (ref 0.44–1.00)
GFR, Est AFR Am: >60 mL/min (ref >60)
GFR, Estimated: >60 mL/min (ref >60)
Glucose, Bld: 262 mg/dL — ABNORMAL HIGH (ref 70–99)
Potassium: 3.6 mmol/L (ref 3.5–5.1)
Sodium: 142 mmol/L (ref 135–145)
Total Bilirubin: 0.5 mg/dL (ref 0.3–1.2)
Total Protein: 6.9 g/dL (ref 6.5–8.1)

## 2019-06-27 LAB — TSH: TSH: 0.974 u[IU]/mL (ref 0.308–3.960)

## 2019-06-27 LAB — T4, FREE: Free T4: 0.73 ng/dL (ref 0.61–1.12)

## 2019-06-27 MED ORDER — DEXAMETHASONE SODIUM PHOSPHATE 10 MG/ML IJ SOLN
INTRAMUSCULAR | Status: AC
  Filled 2019-06-27: qty 1

## 2019-06-27 MED ORDER — FAMOTIDINE IN NACL 20-0.9 MG/50ML-% IV SOLN
INTRAVENOUS | Status: AC
  Filled 2019-06-27: qty 50

## 2019-06-27 MED ORDER — PALONOSETRON HCL INJECTION 0.25 MG/5ML
INTRAVENOUS | Status: AC
  Filled 2019-06-27: qty 5

## 2019-06-27 MED ORDER — ACETAMINOPHEN 325 MG PO TABS
650.0000 mg | ORAL_TABLET | Freq: Once | ORAL | Status: AC
  Administered 2019-06-27: 650 mg via ORAL

## 2019-06-27 MED ORDER — SODIUM CHLORIDE 0.9 % IV SOLN
Freq: Once | INTRAVENOUS | Status: AC
  Filled 2019-06-27: qty 250

## 2019-06-27 MED ORDER — FAMOTIDINE IN NACL 20-0.9 MG/50ML-% IV SOLN
20.0000 mg | Freq: Once | INTRAVENOUS | Status: AC
  Administered 2019-06-27: 20 mg via INTRAVENOUS

## 2019-06-27 MED ORDER — SODIUM CHLORIDE 0.9 % IV SOLN
375.0000 mg/m2 | Freq: Once | INTRAVENOUS | Status: AC
  Administered 2019-06-27: 700 mg via INTRAVENOUS
  Filled 2019-06-27: qty 50

## 2019-06-27 MED ORDER — ACETAMINOPHEN 325 MG PO TABS
ORAL_TABLET | ORAL | Status: AC
  Filled 2019-06-27: qty 2

## 2019-06-27 MED ORDER — DIPHENHYDRAMINE HCL 25 MG PO CAPS
ORAL_CAPSULE | ORAL | Status: AC
  Filled 2019-06-27: qty 2

## 2019-06-27 MED ORDER — DEXAMETHASONE SODIUM PHOSPHATE 10 MG/ML IJ SOLN
10.0000 mg | Freq: Once | INTRAMUSCULAR | Status: AC
  Administered 2019-06-27: 10 mg via INTRAVENOUS

## 2019-06-27 MED ORDER — DIPHENHYDRAMINE HCL 25 MG PO CAPS
ORAL_CAPSULE | ORAL | Status: AC
  Filled 2019-06-27: qty 1

## 2019-06-27 MED ORDER — DIPHENHYDRAMINE HCL 25 MG PO CAPS
50.0000 mg | ORAL_CAPSULE | Freq: Once | ORAL | Status: AC
  Administered 2019-06-27: 50 mg via ORAL

## 2019-06-27 MED ORDER — SODIUM CHLORIDE 0.9 % IV SOLN
90.0000 mg/m2 | Freq: Once | INTRAVENOUS | Status: AC
  Administered 2019-06-27: 175 mg via INTRAVENOUS
  Filled 2019-06-27: qty 7

## 2019-06-27 MED ORDER — PALONOSETRON HCL INJECTION 0.25 MG/5ML
0.2500 mg | Freq: Once | INTRAVENOUS | Status: AC
  Administered 2019-06-27: 0.25 mg via INTRAVENOUS

## 2019-06-27 NOTE — Progress Notes (Signed)
Per Dr. Irene Limbo, okay to treat with [place] 1.2.

## 2019-06-27 NOTE — Patient Instructions (Signed)
[facility] Discharge Instructions for Patients Receiving Chemotherapy  Today you received the following chemotherapy agents: Ruxience/Bendeka.  To help prevent nausea and vomiting after your treatment, we encourage you to take your nausea medication as directed.   If you develop nausea and vomiting that is not controlled by your nausea medication, call the clinic.   BELOW ARE SYMPTOMS THAT SHOULD BE REPORTED IMMEDIATELY:  *FEVER GREATER THAN 100.5 F  *CHILLS WITH OR WITHOUT FEVER  NAUSEA AND VOMITING THAT IS NOT CONTROLLED WITH YOUR NAUSEA MEDICATION  *UNUSUAL SHORTNESS OF BREATH  *UNUSUAL BRUISING OR BLEEDING  TENDERNESS IN MOUTH AND THROAT WITH OR WITHOUT PRESENCE OF ULCERS  *URINARY PROBLEMS  *BOWEL PROBLEMS  UNUSUAL RASH Items with * indicate a potential emergency and should be followed up as soon as possible.  Feel free to call the clinic should you have any questions or concerns. The clinic phone number is [phone].  Please show the [facility] at check-in to the Emergency Department and triage nurse.

## 2019-06-28 ENCOUNTER — Other Ambulatory Visit: Payer: Self-pay

## 2019-06-28 ENCOUNTER — Inpatient Hospital Stay: Payer: Medicare Other

## 2019-06-28 VITALS — BP 149/81 | HR 83 | Temp 98.0°F | Resp 17

## 2019-06-28 DIAGNOSIS — Z5112 Encounter for antineoplastic immunotherapy: Secondary | ICD-10-CM | POA: Diagnosis not present

## 2019-06-28 DIAGNOSIS — Z7189 Other specified counseling: Secondary | ICD-10-CM

## 2019-06-28 DIAGNOSIS — C8218 Follicular lymphoma grade II, lymph nodes of multiple sites: Secondary | ICD-10-CM

## 2019-06-28 LAB — T3, FREE: T3, Free: 3.4 pg/mL (ref 2.0–4.4)

## 2019-06-28 MED ORDER — DEXAMETHASONE SODIUM PHOSPHATE 10 MG/ML IJ SOLN
INTRAMUSCULAR | Status: AC
  Filled 2019-06-28: qty 1

## 2019-06-28 MED ORDER — SODIUM CHLORIDE 0.9 % IV SOLN
90.0000 mg/m2 | Freq: Once | INTRAVENOUS | Status: AC
  Administered 2019-06-28: 175 mg via INTRAVENOUS
  Filled 2019-06-28: qty 7

## 2019-06-28 MED ORDER — SODIUM CHLORIDE 0.9 % IV SOLN
Freq: Once | INTRAVENOUS | Status: AC
  Filled 2019-06-28: qty 250

## 2019-06-28 MED ORDER — DEXAMETHASONE SODIUM PHOSPHATE 10 MG/ML IJ SOLN
10.0000 mg | Freq: Once | INTRAMUSCULAR | Status: AC
  Administered 2019-06-28: 10 mg via INTRAVENOUS

## 2019-06-28 NOTE — Patient Instructions (Signed)
COVID-19 Vaccine Information can be found at: [email or web address] For questions related to vaccine distribution or appointments, please email [email or web address] or call [phone].   [facility] Discharge Instructions for Patients Receiving Chemotherapy  Today you received the following chemotherapy agents: Bendamustine Verl Dicker)  To help prevent nausea and vomiting after your treatment, we encourage you to take your nausea medication as directed by your provider.   If you develop nausea and vomiting that is not controlled by your nausea medication, call the clinic.   BELOW ARE SYMPTOMS THAT SHOULD BE REPORTED IMMEDIATELY:  *FEVER GREATER THAN 100.5 F  *CHILLS WITH OR WITHOUT FEVER  NAUSEA AND VOMITING THAT IS NOT CONTROLLED WITH YOUR NAUSEA MEDICATION  *UNUSUAL SHORTNESS OF BREATH  *UNUSUAL BRUISING OR BLEEDING  TENDERNESS IN MOUTH AND THROAT WITH OR WITHOUT PRESENCE OF ULCERS  *URINARY PROBLEMS  *BOWEL PROBLEMS  UNUSUAL RASH Items with * indicate a potential emergency and should be followed up as soon as possible.  Feel free to call the clinic should you have any questions or concerns. The clinic phone number is [phone].  Please show the [place] [facility] [place] at check-in to the Emergency Department and triage nurse.  Coronavirus (COVID-19) Are you at risk?  Are you at risk for the Coronavirus (COVID-19)?  To be considered HIGH RISK for Coronavirus (COVID-19), you have to meet the following criteria:  . Traveled to [place], [place], [place], [place] or [place]; or in the [place] to [place], [place], [place], or [place]; and have fever, cough, and shortness of breath within the last 2 weeks of travel OR . Been in close contact with a person diagnosed with COVID-19 within the last 2 weeks and have fever, cough, and shortness of breath . IF YOU DO NOT MEET THESE CRITERIA, YOU ARE  CONSIDERED LOW RISK FOR COVID-19.  What to do if you are HIGH RISK for COVID-19?  Marland Kitchen If you are having a medical emergency, call 911. . Seek medical care right away. Before you go to a doctor's office, urgent care or emergency department, call ahead and tell them about your recent travel, contact with someone diagnosed with COVID-19, and your symptoms. You should receive instructions from your physician's office regarding next steps of care.  . When you arrive at healthcare provider, tell the healthcare staff immediately you have returned from visiting [place], [place], [place], [place] or [place]; or traveled in the [place] to [place], [place], [place], or [place]; in the last two weeks or you have been in close contact with a person diagnosed with COVID-19 in the last 2 weeks.   . Tell the health care staff about your symptoms: fever, cough and shortness of breath. . After you have been seen by a medical provider, you will be either: o Tested for (COVID-19) and discharged home on quarantine except to seek medical care if symptoms worsen, and asked to  - Stay home and avoid contact with others until you get your results (4-5 days)  - Avoid travel on public transportation if possible (such as bus, train, or airplane) or o Sent to the Emergency Department by EMS for evaluation, COVID-19 testing, and possible admission depending on your condition and test results.  What to do if you are LOW RISK for COVID-19?  Reduce your risk of any infection by using the same precautions used for avoiding the common cold or flu:  Marland Kitchen Wash your hands often with soap and warm water for at  least 20 seconds.  If soap and water are not readily available, use an alcohol-based hand sanitizer with at least 60% alcohol.  . If coughing or sneezing, cover your mouth and nose by coughing or sneezing into the elbow areas of your shirt or coat, into a tissue or into your sleeve (not your hands). . Avoid shaking hands  with others and consider head nods or verbal greetings only. . Avoid touching your eyes, nose, or mouth with unwashed hands.  . Avoid close contact with people who are sick. . Avoid places or events with large numbers of people in one location, like concerts or sporting events. . Carefully consider travel plans you have or are making. . If you are planning any travel outside or inside the [place], visit the CDC's Travelers' Health webpage for the latest health notices. . If you have some symptoms but not all symptoms, continue to monitor at home and seek medical attention if your symptoms worsen. . If you are having a medical emergency, call 911.   [place] / e-Visit: [email or web address]         [facility]: [facility]: [phone]                   [facility]: [phone]

## 2019-07-10 ENCOUNTER — Other Ambulatory Visit: Payer: Self-pay

## 2019-07-10 ENCOUNTER — Encounter (HOSPITAL_COMMUNITY)
Admission: RE | Admit: 2019-07-10 | Discharge: 2019-07-10 | Disposition: A | Discharge status: Home / Self Care | Payer: Medicare Other | Source: Ambulatory Visit | Attending: Hematology | Admitting: Hematology

## 2019-07-10 DIAGNOSIS — R911 Solitary pulmonary nodule: Secondary | ICD-10-CM | POA: Insufficient documentation

## 2019-07-10 DIAGNOSIS — K760 Fatty (change of) liver, not elsewhere classified: Secondary | ICD-10-CM | POA: Diagnosis not present

## 2019-07-10 DIAGNOSIS — I700 Atherosclerosis of aorta: Secondary | ICD-10-CM | POA: Diagnosis not present

## 2019-07-10 DIAGNOSIS — C8298 Follicular lymphoma, unspecified, lymph nodes of multiple sites: Secondary | ICD-10-CM | POA: Diagnosis not present

## 2019-07-10 LAB — GLUCOSE, CAPILLARY: Glucose-Capillary: 161 mg/dL — ABNORMAL HIGH (ref 70–99)

## 2019-07-10 MED ORDER — FLUDEOXYGLUCOSE F - 18 (FDG) INJECTION
9.6000 | Freq: Once | INTRAVENOUS | Status: AC
  Administered 2019-07-10: 9.6 via INTRAVENOUS

## 2019-07-16 ENCOUNTER — Telehealth: Payer: Self-pay | Admitting: Hematology

## 2019-07-16 NOTE — Telephone Encounter (Signed)
Rescheduled 4/1 lab and f/u per MD PAL. Called and spoke with patient. Confirmed change in appts

## 2019-07-18 ENCOUNTER — Other Ambulatory Visit: Payer: Self-pay | Admitting: Hematology

## 2019-07-23 NOTE — Progress Notes (Signed)
HEMATOLOGY/ONCOLOGY CLINIC NOTE  Date of Service: 07/23/2019  Patient Care Team: [name], MD as PCP - General (Internal Medicine)  CHIEF COMPLAINTS/PURPOSE OF CONSULTATION:  Recently diagnosed follicular lymphoma  HISTORY OF PRESENTING ILLNESS:   Meghan Alexander is a wonderful 64 y.o. female who has been referred to [place] by Dr Layne Benton for evaluation and management of possible lymphoproliferative disease.The pt reports that she is doing well overall.  The pt reports that she had an MRI earlier this month because of her back pain. She has had back pain for about 30 years, but in May, it got so bad that she could not move. The pain radiated around her hip and down to her knee. She could not even get in her car to go to the ER. She put herself on bedrest for 5 weeks which improved her symptoms. Now, her back pain is back to baseline.  Pt reported that, in the past 6 months, she has been moving her bowels 3-4x daily, which is much more than normal for her. Throughout the day, her stools get progressively looser. She went to the ER on 10/11/2018 for food poisoning from mayonnaise because she had blood in the stool and cramping. The symptoms began 2 hours after she ate the mayo. She was not placed on antibiotics or any other medications. No stool testing was done. Her symptoms resolved in 2 days.  She experienced night sweats up until 2006, and they returned 6 months ago. She does not take hormone replacements because of her FHx of breast cancer. She also reports some new fatigue, dry eyes, and headaches over the last 6 months. Denies unexpected weight loss. She has not been diagnosed with diabetes.  Of note prior to the patient's visit today, pt has had MRI lumbar spine wo contrast completed on 11/02/2018 with results revealing "Extensive retroperitoneal lymphadenopathy highly worrisome for neoplastic process such as lymphoproliferative disease. 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 There is mild central canal narrowing overall at this Level."  Most recent lab results (10/11/2018) of CBC is as follows: all values are WNL except for Potassium at 3.3, glucose bld at 212. 10/11/2018 Urinalysis revealed hazy appearance 10/11/2018 Lipase at 29  On review of systems, pt reports back pain, 3-4 bowel movements daily, dry eyes, headaches, night sweats, and denies unexpected weight loss, mouth sores, belly pain, changes in urination trouble swallowing, fever, chills, and any other symptoms.   On PMHx the pt reports total hysterectomy at 38 years ago due to ovarian cysts, lymphoreticulosis, sleep apnea related surgery, breast augmentation with no removal of breast tissue On Social Hx the pt reports never smoking On Family Hx the pt reports breast cancer on both sides of the family.   INTERVAL HISTORY  Meghan Alexander is a 64 y.o. female presenting today for the management and evaluation of her follicular lymphoma. The patient's last visit with [place] was on 06/27/19. The pt reports that she is doing well overall.  The pt reports she is good. She was diagnosed yesterday from PCP with diabetes. She will be started on Metformin to help manage diabetes. Pt has gotten both doses of the COVID19 vaccine. She reports that her heart rate has been down about 20 beats per minute.   Of note since the patient's last visit, pt has had PET Skull  Base to Thigh ([id number]) completed on 07/10/19 with results revealing 1. Marked interval decrease and or resolution and activity and size of lymph nodes in the chest, abdomen and pelvis as described. Deauville 2 with respect to the largest remaining lymph node in the right common iliac region which is just below blood pool in terms ofactivity. 2. 8 mm lingular nodule is unchanged dating back to  2018, attention on follow-up. 3. Marked hepatic steatosis. 4.  Aortic Atherosclerosis (ICD10-I70.0).  Lab results today (07/24/19) of CBC w/diff and CMP is as follows: all values are WNL except for WBC at 3.0K, Platelets at 139K, Lymph's Abs at 0.3K, Glucose at 208, BUN at 7. 07/24/19 of LDH at 304  On review of systems, pt reports lower heart rate and denies cough, SOB, and any other symptoms.     MEDICAL HISTORY:  Past Medical History:  Diagnosis Date  . Allergy    year around allergies  . Arthritis    fingers, knees, neck, back-osteoarthristis  . Bronchitis    hx of  . Complication of anesthesia   . 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 GERD (gastroesophageal reflux disease)    hx of reflux-went away with elevating HOB   . Hypertension   . Pneumonia    hx of   . PONV (postoperative nausea and vomiting)     SURGICAL HISTORY: Past Surgical History:  Procedure Laterality Date  . ABDOMINAL HYSTERECTOMY    . ADENOIDECTOMY    . cosmetic surgeries    . ESOPHAGEAL MANOMETRY N/A 12/02/2015   Procedure: ESOPHAGEAL MANOMETRY (EM);  Surgeon: [name], MD;  Location: [facility];  Service: Endoscopy;  Laterality: N/A;  . ESOPHAGOGASTRODUODENOSCOPY N/A 12/02/2015   Procedure: ESOPHAGOGASTRODUODENOSCOPY (EGD);  Surgeon: [name], MD;  Location: [facility];  Service: Endoscopy;  Laterality: N/A;  . lymph node removal left leg     cat scratch surgery   . THROAT SURGERY     sleep apnea surgery   . TONSILLECTOMY      SOCIAL HISTORY: Social History   Socioeconomic History  . Marital status: Married    Spouse name: Not on file  . Number of children: Not on file  . Years of education: Not on file  . Highest education level: Not on file  Occupational History  . Not on file  Tobacco Use  . Smoking status: Never Smoker  . Smokeless tobacco: Never Used  Substance and Sexual Activity  . Alcohol use: No    Alcohol/week: 0.0  standard drinks  . Drug use: No  . Sexual activity: Not on file  Other Topics Concern  . 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 Difficulty of Paying Living Expenses:   Food Insecurity:   . Worried About Charity fundraiser in the Last Year:   . Arboriculturist in the Last Year:   Transportation Needs:   . Film/video editor (Medical):   Marland Kitchen Lack of Transportation (Non-Medical):   Physical Activity:   . Days of Exercise per Week:   . Minutes of Exercise per Session:   Stress:   . Feeling of Stress :  Social Connections:   . Frequency of Communication with Friends and Family:   . Frequency of Social Gatherings with Friends and Family:   . Attends Religious Services:   . Active Member of Clubs or Organizations:   . Attends Archivist Meetings:   Marland Kitchen Marital Status:   Intimate Partner Violence:   . Fear of Current or Ex-Partner:   . Emotionally Abused:   Marland Kitchen Physically Abused:   . Sexually Abused:     FAMILY HISTORY: Family History  Problem Relation Age of Onset  . Lung cancer Father        1982 died from it  . Hernia Mother   . Breast cancer Maternal Aunt        multiple   . Breast cancer Maternal Grandmother   . Breast cancer Paternal Grandmother   . Breast cancer Cousin        cousins multiple   . Colon cancer Neg Hx   . Colon polyps Neg Hx   . Rectal cancer Neg Hx   . Stomach cancer Neg Hx     ALLERGIES:  is allergic to latex; codeine; other; hydrocodone; and oxycodone.  MEDICATIONS:  Current Outpatient Medications  Medication Sig Dispense Refill  . acyclovir (ZOVIRAX) 400 MG tablet Take 1 tablet (400 mg total) by mouth daily. (Patient not taking: Reported on 05/20/2019) 30 tablet 6  .  aspirin EC 81 MG tablet Take 81 mg by mouth daily.    Marland Kitchen dexamethasone (DECADRON) 4 MG tablet TAKE 2 TABLETS BY MOUTH DAILY FOR 3 DAYS, STARTING THE DAY AFTER COMPLETEING EACH CHEMOTHERAPY 30 tablet 0  . diphenhydrAMINE (BENADRYL) 25 MG tablet Take 25 mg by mouth at bedtime.    . Lactobacillus-Inulin (CULTURELLE DIGESTIVE HEALTH PO) Take 1 capsule by mouth at bedtime.    Marland Kitchen lisinopril-hydrochlorothiazide (ZESTORETIC) 20-12.5 MG tablet Take 1 tablet by mouth daily.    Marland Kitchen LORazepam (ATIVAN) 0.5 MG tablet TAKE 1 TABLET(0.5 MG) BY MOUTH EVERY 8 HOURS 30 tablet 0  . Melatonin 10 MG CAPS Take 10 mg by mouth at bedtime.     . meloxicam (MOBIC) 15 MG tablet Take 15 mg by mouth daily.    . metoprolol succinate (TOPROL-XL) 50 MG 24 hr tablet Take 50 mg by mouth daily.    . Multiple Vitamin (MULTIVITAMIN) tablet Take 1 tablet by mouth daily.    . ondansetron (ZOFRAN) 8 MG tablet Take 1 tablet (8 mg total) by mouth every 8 (eight) hours as needed for nausea. Starting 2 days after each chemotherapy 30 tablet 3  . predniSONE (DELTASONE) 10 MG tablet Take 10 mg by mouth 2 (two) times daily.    . prochlorperazine (COMPAZINE) 10 MG tablet Take 1 tablet (10 mg total) by mouth every 6 (six) hours as needed for nausea or vomiting. 30 tablet 0  . rosuvastatin (CRESTOR) 10 MG tablet Take 10 mg by mouth daily.    Marland Kitchen venlafaxine (EFFEXOR) 37.5 MG tablet Take 37.5 mg by mouth once.     No current facility-administered medications for this visit.    REVIEW OF SYSTEMS: A 10+ POINT REVIEW OF SYSTEMS WAS OBTAINED including neurology, dermatology, psychiatry, cardiac, respiratory, lymph, extremities, GI, GU, Musculoskeletal, constitutional, breasts, reproductive, HEENT.  All pertinent positives are noted in the HPI.  All others are negative.   PHYSICAL EXAMINATION: ECOG FS:1 - Symptomatic but completely ambulatory  There were no vitals filed for this visit. Wt Readings from Last 3 Encounters:  06/27/19 195 lb 12.8 oz (88.8  kg)  05/30/19 195 lb 12.8 oz (88.8 kg)  05/20/19 196 lb 3.2 oz (89 kg)   There is no height or weight on file to calculate BMI.      GENERAL:alert, in no acute distress and comfortable SKIN: no acute rashes, no significant lesions EYES: conjunctiva are pink and non-injected, sclera anicteric OROPHARYNX: MMM, no exudates, no oropharyngeal erythema or ulceration NECK: supple, no JVD LYMPH:  no palpable lymphadenopathy in the cervical, axillary or inguinal regions LUNGS: clear to auscultation b/l with normal respiratory effort HEART: regular rate & rhythm ABDOMEN:  normoactive bowel sounds , non tender, not distended. Extremity: no pedal edema PSYCH: alert & oriented x 3 with fluent speech NEURO: no focal motor/sensory deficits   LABORATORY DATA:  I have reviewed the data as listed  . CBC Latest Ref Rng & Units 06/27/2019 05/30/2019 05/02/2019  WBC 4.0 - 10.5 K/uL 2.5(L) 4.6 6.1  Hemoglobin 12.0 - 15.0 g/dL 13.3 13.4 13.1  Hematocrit 36.0 - 46.0 % 36.5 37.8 36.3  Platelets 150 - 400 K/uL 163 167 159    . CMP Latest Ref Rng & Units 06/27/2019 05/30/2019 05/14/2019  Glucose 70 - 99 mg/dL 262(H) 250(H) 200(H)  BUN 8 - 23 mg/dL 12 11 13   Creatinine 0.44 - 1.00 mg/dL 0.78 0.71 0.74  Sodium 135 - 145 mmol/L 142 138 139  Potassium 3.5 - 5.1 mmol/L 3.6 3.6 4.1  Chloride 98 - 111 mmol/L 101 98 99  CO2 22 - 32 mmol/L 28 27 28   Calcium 8.9 - 10.3 mg/dL 9.2 9.4 9.2  Total Protein 6.5 - 8.1 g/dL 6.9 6.7 6.5  Total Bilirubin 0.3 - 1.2 mg/dL 0.5 0.4 0.3  Alkaline Phos 38 - 126 U/L 60 49 57  AST 15 - 41 U/L 29 35 19  ALT 0 - 44 U/L 42 37 30   . Lab Results  Component Value Date   LDH 234 (H) 04/15/2019   05/14/2019 CT ANGIO CHEST PE W OR WO CONTRAST (Accession [id number]):   03/08/2019 Lymph Node Biopsy [id number]):      RADIOGRAPHIC STUDIES: I have personally reviewed the radiological images as listed and agreed with the findings in the report. NM PET Image Restag (PS) Skull  Base To Thigh  Result Date: 07/10/2019 CLINICAL DATA:  Subsequent treatment strategy for follicular lymphoma. Recent COVID-19 vaccination in left arm EXAM: NUCLEAR MEDICINE PET SKULL BASE TO THIGH TECHNIQUE: 9.6 mCi F-18 FDG was injected intravenously. Full-ring PET imaging was performed from the skull base to thigh after the radiotracer. CT data was obtained and used for attenuation correction and anatomic localization. Fasting blood glucose: 161 mg/dl COMPARISON:  PET scan dated 02/06/2019 and prior CT study of the chest abdomen pelvis 12/04/2018 FINDINGS: Mediastinal blood pool activity: SUV max 3.13 Liver activity: SUV max 4.23 NECK: No hypermetabolic lymph nodes in the neck. Mildly asymmetric metabolic activity in the right tonsillar region diminished since the prior exam, no focal features on today's exam, for reference max SUV of 3.0 as compared to 4.8 Incidental CT findings: none CHEST: Decreased metabolism, subjectively at sites of nodal disease in the chest. High left paratracheal lymph node (image 50, series 4) now approximately 5 mm as compared to 11 mm on the prior study. SUV max measured at below blood pool activity at 0.8 as compared to 5.7 on the prior study. Diminished size also of right axillary lymph node 5 mm, previously approximately 6-7 mm also with diminished FDG uptake, below blood pool. No hypermetabolic lymph nodes  elsewhere in the chest. Incidental CT findings: Calcified atherosclerotic changes of the thoracic aorta are similar to the previous exam. The esophagus is patulous and fluid-filled, a finding that is also unchanged. Signs of bilateral breast implants as before. Basilar atelectasis. Signs of scarring in the lingula with diminished conspicuity perhaps a small area of nodularity seen peripherally is unchanged and not associated with FDG uptake. Assessment limited due to respiratory motion also small discrete nodule seen along the fissure in the lingula unchanged approximately 6-7  mm. This is unchanged dating back to February of 2018. ABDOMEN/PELVIS: No abnormal hypermetabolic activity within the liver, pancreas, adrenal glands, or spleen. No hypermetabolic lymph nodes in the abdomen or pelvis. Spleen is normal size. Bulky retroperitoneal adenopathy has resolved. There are no lymph nodes with pathologic enlargement in the left retroperitoneum Previously described 1.8 cm lymph node in the high left retroperitoneum at is now approximately 5-6 mm (image 125, series 4) (SUVmax = 2.1) Similar findings related to the other lymph nodes that was indexed along the left periaortic chain essentially no activity above blood pool noted at this time Right common iliac lymph node (image 145, series 4) 10 mm (SUVmax = 2.6) previously 10.3 Incidental CT findings: Marked hepatic steatosis. Signs of granulomatous change in the spleen. Pancreas, adrenal glands and kidneys are unremarkable. Urinary bladder is under distended. No acute bowel process with normal appendix. Signs of hysterectomy. SKELETON: No focal hypermetabolic activity to suggest skeletal metastasis. Incidental CT findings: Spinal degenerative changes without acute or destructive bone process. IMPRESSION: 1. Marked interval decrease and or resolution and activity and size of lymph nodes in the chest, abdomen and pelvis as described. Deauville 2 with respect to the largest remaining lymph node in the right common iliac region which is just below blood pool in terms of activity. 2. 8 mm lingular nodule is unchanged dating back to 2018, attention on follow-up. 3. Marked hepatic steatosis. 4.  Aortic Atherosclerosis (ICD10-I70.0). Electronically Signed   By: Zetta Bills M.D.   On: 07/10/2019 12:38    ASSESSMENT & PLAN:   #1 Stage III/IV follicular lymphoma - likely low grade but accurate grading difficult  11/02/2018 MRI lumbar spine wo contrast revealed "Extensive retroperitoneal lymphadenopathy highly worrisome for neoplastic process such  as lymphoproliferative disease. Spondylosis worst at L4-5 where there is narrowing in the right subarticular recess predominantly due to a synovial cyst off the medial margin of the right facet with encroachment on the descending right L5 root. There is mild central canal narrowing overall at this Level."  12/04/2018 CT CAP revealed "Mild-to-moderate abdominal and pelvic lymphadenopathy and new 9 mm left superior mediastinal lymph node, highly suspicious for lymphoproliferative disorder. Stable 8 mm lingular pulmonary nodule, consistent with benign Etiology. Moderate hepatic steatosis. 1.3 cm indeterminate low-attenuation lesion in the right lobe. Recommend continued attention on follow-up CT."  12/18/2018 lymph node biopsy pathology revealing a follicular lymphoma- likely low grade but accurate grading not possible on limited sampling.  02/06/2019 PET/CT scan ([id number]) revealed "1. Bulky hypermetabolic lymphadenopathy in the abdomen and upper pelvis, as described. This is associated with mild hypermetabolic lymphadenopathy in the upper mediastinum and right axilla. Hypermetabolic lymphadenopathy represents a combination of Deauville 4 and Deauville 5 disease. 2. 8 mm nodule in the lingula without hypermetabolism. Attention on follow-up recommended. 3. Hepatic steatosis. 4.  Aortic Atherosclerois (ICD10-170.0)."  03/08/2019 Lymph Node Biopsy ([id number]) revealed "LYMPH NODE, NEEDLE CORE BIOPSY: - Follicular lymphoma (grade 1-2 of 3) with elevated proliferation rate."  2. Mild treatment  related thrombocytopenia  PLAN: -Discussed pt labwork today, 07/24/19; of CBC w/diff and CMP is as follows: all values are WNL except for WBC at 3.0K, Platelets at 139K, Lymph's Abs at 0.3K, Glucose at 208, BUN at 7. 07/24/19 of LDH at 304 -Discussed 07/10/19 of PET Skull Base to Thigh ([id number]) -Advised on Diabetes diagnosis- Metformin medication   -Advised on scheduling mammogram  -Advised/educated on  maintience Rituxan    -patient with no prohibitive toxicities from BR at this time. [place] 1.2k -will need monitoring and consideration of G-CSF with subsequent cycles -okay to proceed with C5 of BR treatment with G-CSF support for previous neutropenia. -Recommended that the pt continue to eat well, drink at least 48-64 oz of water each day, and walk 20-30 minutes each day, and other managements of diabetes  -Recommends finishing C5 and skipping C6 -Will start maintenance Rituxan     FOLLOW UP: F/u for C5 of BR as scheduled on 4/1 and 4/2 Will cancel C6 of BR Plz schedule for starting maintenance Rituxan with labs and MD visit in 2 months   The total time spent in the appt was 30 minutes and more than 50% was on counseling and direct patient cares and addressing chemotherapy orders  All of the patient's questions were answered with apparent satisfaction. The patient knows to call the clinic with any problems, questions or concerns.   Sullivan Lone MD [place] AAHIVMS [facility] Hematology/Oncology Physician [facility]  (Office):       [phone] (Work cell):  [phone] (Fax):           [phone]  07/23/2019 3:22 PM I, Dawayne Cirri am acting as a Education administrator for Dr. Sullivan Lone.   .I have reviewed the above documentation for accuracy and completeness, and I agree with the above. Brunetta Genera MD

## 2019-07-24 ENCOUNTER — Other Ambulatory Visit: Payer: Self-pay

## 2019-07-24 ENCOUNTER — Inpatient Hospital Stay: Payer: Medicare Other

## 2019-07-24 ENCOUNTER — Inpatient Hospital Stay: Payer: Medicare Other | Admitting: Hematology

## 2019-07-24 ENCOUNTER — Telehealth: Payer: Self-pay | Admitting: Hematology

## 2019-07-24 VITALS — BP 139/79 | HR 102 | Temp 98.2°F | Resp 17 | Ht 62.0 in | Wt 194.3 lb

## 2019-07-24 DIAGNOSIS — C8218 Follicular lymphoma grade II, lymph nodes of multiple sites: Secondary | ICD-10-CM

## 2019-07-24 DIAGNOSIS — Z5111 Encounter for antineoplastic chemotherapy: Secondary | ICD-10-CM

## 2019-07-24 DIAGNOSIS — Z5112 Encounter for antineoplastic immunotherapy: Secondary | ICD-10-CM | POA: Diagnosis not present

## 2019-07-24 DIAGNOSIS — C8298 Follicular lymphoma, unspecified, lymph nodes of multiple sites: Secondary | ICD-10-CM

## 2019-07-24 LAB — CMP (CANCER CENTER ONLY)
ALT: 36 U/L (ref 0–44)
AST: 30 U/L (ref 15–41)
Albumin: 4.1 g/dL (ref 3.5–5.0)
Alkaline Phosphatase: 59 U/L (ref 38–126)
Anion gap: 15 (ref 5–15)
BUN: 7 mg/dL — ABNORMAL LOW (ref 8–23)
CO2: 29 mmol/L (ref 22–32)
Calcium: 9.7 mg/dL (ref 8.9–10.3)
Chloride: 99 mmol/L (ref 98–111)
Creatinine: 0.9 mg/dL (ref 0.44–1.00)
GFR, Est AFR Am: >60 mL/min (ref >60)
GFR, Estimated: >60 mL/min (ref >60)
Glucose, Bld: 208 mg/dL — ABNORMAL HIGH (ref 70–99)
Potassium: 4 mmol/L (ref 3.5–5.1)
Sodium: 143 mmol/L (ref 135–145)
Total Bilirubin: 0.6 mg/dL (ref 0.3–1.2)
Total Protein: 7.1 g/dL (ref 6.5–8.1)

## 2019-07-24 LAB — CBC WITH DIFFERENTIAL/PLATELET
Abs Immature Granulocytes: 0.07 10*3/uL (ref 0.00–0.07)
Basophils Absolute: 0 10*3/uL (ref 0.0–0.1)
Basophils Relative: 1 %
Eosinophils Absolute: 0.1 10*3/uL (ref 0.0–0.5)
Eosinophils Relative: 4 %
HCT: 38 % (ref 36.0–46.0)
Hemoglobin: 13.5 g/dL (ref 12.0–15.0)
Immature Granulocytes: 2 %
Lymphocytes Relative: 10 %
Lymphs Abs: 0.3 10*3/uL — ABNORMAL LOW (ref 0.7–4.0)
MCH: 31.4 pg (ref 26.0–34.0)
MCHC: 35.5 g/dL (ref 30.0–36.0)
MCV: 88.4 fL (ref 80.0–100.0)
Monocytes Absolute: 0.5 10*3/uL (ref 0.1–1.0)
Monocytes Relative: 15 %
Neutro Abs: 2 10*3/uL (ref 1.7–7.7)
Neutrophils Relative %: 68 %
Platelets: 139 10*3/uL — ABNORMAL LOW (ref 150–400)
RBC: 4.3 MIL/uL (ref 3.87–5.11)
RDW: 13.1 % (ref 11.5–15.5)
WBC: 3 10*3/uL — ABNORMAL LOW (ref 4.0–10.5)
nRBC: 0 % (ref 0.0–0.2)

## 2019-07-24 LAB — LACTATE DEHYDROGENASE: LDH: 304 U/L — ABNORMAL HIGH (ref 98–192)

## 2019-07-24 NOTE — Telephone Encounter (Signed)
Scheduled appts per 3/31 los. Pt delcined AVS and stated she would refer to mychart.

## 2019-07-25 ENCOUNTER — Other Ambulatory Visit: Payer: Medicare Other

## 2019-07-25 ENCOUNTER — Inpatient Hospital Stay: Payer: Medicare Other | Attending: Hematology

## 2019-07-25 ENCOUNTER — Ambulatory Visit: Payer: Medicare Other | Admitting: Hematology

## 2019-07-25 ENCOUNTER — Other Ambulatory Visit: Payer: Self-pay | Admitting: Hematology and Oncology

## 2019-07-25 ENCOUNTER — Other Ambulatory Visit: Payer: Self-pay

## 2019-07-25 VITALS — BP 124/88 | HR 85 | Temp 98.4°F | Resp 16

## 2019-07-25 DIAGNOSIS — Z5112 Encounter for antineoplastic immunotherapy: Secondary | ICD-10-CM | POA: Insufficient documentation

## 2019-07-25 DIAGNOSIS — Z7189 Other specified counseling: Secondary | ICD-10-CM

## 2019-07-25 DIAGNOSIS — C8218 Follicular lymphoma grade II, lymph nodes of multiple sites: Secondary | ICD-10-CM

## 2019-07-25 DIAGNOSIS — Z5189 Encounter for other specified aftercare: Secondary | ICD-10-CM | POA: Insufficient documentation

## 2019-07-25 DIAGNOSIS — Z5111 Encounter for antineoplastic chemotherapy: Secondary | ICD-10-CM | POA: Insufficient documentation

## 2019-07-25 DIAGNOSIS — C8228 Follicular lymphoma grade III, unspecified, lymph nodes of multiple sites: Secondary | ICD-10-CM | POA: Diagnosis present

## 2019-07-25 MED ORDER — ACETAMINOPHEN 325 MG PO TABS
ORAL_TABLET | ORAL | Status: AC
  Filled 2019-07-25: qty 2

## 2019-07-25 MED ORDER — SODIUM CHLORIDE 0.9 % IV SOLN
375.0000 mg/m2 | Freq: Once | INTRAVENOUS | Status: AC
  Administered 2019-07-25: 700 mg via INTRAVENOUS
  Filled 2019-07-25: qty 50

## 2019-07-25 MED ORDER — ACETAMINOPHEN 325 MG PO TABS
650.0000 mg | ORAL_TABLET | Freq: Once | ORAL | Status: AC
  Administered 2019-07-25: 650 mg via ORAL

## 2019-07-25 MED ORDER — DEXAMETHASONE SODIUM PHOSPHATE 10 MG/ML IJ SOLN
INTRAMUSCULAR | Status: AC
  Filled 2019-07-25: qty 1

## 2019-07-25 MED ORDER — SODIUM CHLORIDE 0.9 % IV SOLN
90.0000 mg/m2 | Freq: Once | INTRAVENOUS | Status: AC
  Administered 2019-07-25: 175 mg via INTRAVENOUS
  Filled 2019-07-25: qty 7

## 2019-07-25 MED ORDER — DIPHENHYDRAMINE HCL 25 MG PO CAPS
50.0000 mg | ORAL_CAPSULE | Freq: Once | ORAL | Status: AC
  Administered 2019-07-25: 50 mg via ORAL

## 2019-07-25 MED ORDER — PALONOSETRON HCL INJECTION 0.25 MG/5ML
INTRAVENOUS | Status: AC
  Filled 2019-07-25: qty 5

## 2019-07-25 MED ORDER — SODIUM CHLORIDE 0.9 % IV SOLN
Freq: Once | INTRAVENOUS | Status: AC
  Filled 2019-07-25: qty 250

## 2019-07-25 MED ORDER — DEXAMETHASONE SODIUM PHOSPHATE 10 MG/ML IJ SOLN
10.0000 mg | Freq: Once | INTRAMUSCULAR | Status: AC
  Administered 2019-07-25: 10 mg via INTRAVENOUS

## 2019-07-25 MED ORDER — PALONOSETRON HCL INJECTION 0.25 MG/5ML
0.2500 mg | Freq: Once | INTRAVENOUS | Status: AC
  Administered 2019-07-25: 0.25 mg via INTRAVENOUS

## 2019-07-25 MED ORDER — FAMOTIDINE IN NACL 20-0.9 MG/50ML-% IV SOLN
INTRAVENOUS | Status: AC
  Filled 2019-07-25: qty 50

## 2019-07-25 MED ORDER — FAMOTIDINE IN NACL 20-0.9 MG/50ML-% IV SOLN
20.0000 mg | Freq: Once | INTRAVENOUS | Status: AC
  Administered 2019-07-25: 20 mg via INTRAVENOUS

## 2019-07-25 MED ORDER — DIPHENHYDRAMINE HCL 25 MG PO CAPS
ORAL_CAPSULE | ORAL | Status: AC
  Filled 2019-07-25: qty 2

## 2019-07-25 NOTE — Patient Instructions (Signed)
[facility] Discharge Instructions for Patients Receiving Chemotherapy  Today you received the following chemotherapy agents Rituximab (RUXIENCE) & Bendamustine (BENDEKA).  To help prevent nausea and vomiting after your treatment, we encourage you to take your nausea medication as prescribed.   If you develop nausea and vomiting that is not controlled by your nausea medication, call the clinic.   BELOW ARE SYMPTOMS THAT SHOULD BE REPORTED IMMEDIATELY:  *FEVER GREATER THAN 100.5 F  *CHILLS WITH OR WITHOUT FEVER  NAUSEA AND VOMITING THAT IS NOT CONTROLLED WITH YOUR NAUSEA MEDICATION  *UNUSUAL SHORTNESS OF BREATH  *UNUSUAL BRUISING OR BLEEDING  TENDERNESS IN MOUTH AND THROAT WITH OR WITHOUT PRESENCE OF ULCERS  *URINARY PROBLEMS  *BOWEL PROBLEMS  UNUSUAL RASH Items with * indicate a potential emergency and should be followed up as soon as possible.  Feel free to call the clinic should you have any questions or concerns. The clinic phone number is [phone].  Please show the CHEMO ALERT CARD at check-in to the Emergency Department and triage nurse.   

## 2019-07-26 ENCOUNTER — Other Ambulatory Visit: Payer: Self-pay

## 2019-07-26 ENCOUNTER — Inpatient Hospital Stay: Payer: Medicare Other

## 2019-07-26 VITALS — BP 139/87 | HR 94 | Temp 98.0°F | Resp 17 | Ht 62.0 in | Wt 195.0 lb

## 2019-07-26 DIAGNOSIS — Z5112 Encounter for antineoplastic immunotherapy: Secondary | ICD-10-CM | POA: Diagnosis not present

## 2019-07-26 DIAGNOSIS — C8218 Follicular lymphoma grade II, lymph nodes of multiple sites: Secondary | ICD-10-CM

## 2019-07-26 DIAGNOSIS — Z7189 Other specified counseling: Secondary | ICD-10-CM

## 2019-07-26 MED ORDER — DEXAMETHASONE SODIUM PHOSPHATE 10 MG/ML IJ SOLN
10.0000 mg | Freq: Once | INTRAMUSCULAR | Status: AC
  Administered 2019-07-26: 10 mg via INTRAVENOUS

## 2019-07-26 MED ORDER — SODIUM CHLORIDE 0.9 % IV SOLN
Freq: Once | INTRAVENOUS | Status: AC
  Filled 2019-07-26: qty 250

## 2019-07-26 MED ORDER — SODIUM CHLORIDE 0.9 % IV SOLN
90.0000 mg/m2 | Freq: Once | INTRAVENOUS | Status: AC
  Administered 2019-07-26: 175 mg via INTRAVENOUS
  Filled 2019-07-26: qty 7

## 2019-07-26 MED ORDER — DEXAMETHASONE SODIUM PHOSPHATE 10 MG/ML IJ SOLN
INTRAMUSCULAR | Status: AC
  Filled 2019-07-26: qty 1

## 2019-07-26 MED ORDER — PEGFILGRASTIM 6 MG/0.6ML ~~LOC~~ PSKT
6.0000 mg | PREFILLED_SYRINGE | Freq: Once | SUBCUTANEOUS | Status: AC
  Administered 2019-07-26: 6 mg via SUBCUTANEOUS

## 2019-07-26 MED ORDER — PEGFILGRASTIM 6 MG/0.6ML ~~LOC~~ PSKT
PREFILLED_SYRINGE | SUBCUTANEOUS | Status: AC
  Filled 2019-07-26: qty 0.6

## 2019-07-26 NOTE — Patient Instructions (Signed)
[facility] Discharge Instructions for Patients Receiving Chemotherapy  Today you received the following chemotherapy agents Bendeka  To help prevent nausea and vomiting after your treatment, we encourage you to take your nausea medication As directed   If you develop nausea and vomiting that is not controlled by your nausea medication, call the clinic.   BELOW ARE SYMPTOMS THAT SHOULD BE REPORTED IMMEDIATELY:  *FEVER GREATER THAN 100.5 F  *CHILLS WITH OR WITHOUT FEVER  NAUSEA AND VOMITING THAT IS NOT CONTROLLED WITH YOUR NAUSEA MEDICATION  *UNUSUAL SHORTNESS OF BREATH  *UNUSUAL BRUISING OR BLEEDING  TENDERNESS IN MOUTH AND THROAT WITH OR WITHOUT PRESENCE OF ULCERS  *URINARY PROBLEMS  *BOWEL PROBLEMS  UNUSUAL RASH Items with * indicate a potential emergency and should be followed up as soon as possible.  Feel free to call the clinic should you have any questions or concerns. The clinic phone number is [phone].  Please show the [place] at check-in to the Emergency Department and triage nurse.

## 2019-08-05 ENCOUNTER — Other Ambulatory Visit: Payer: Self-pay | Admitting: Hematology

## 2019-08-19 ENCOUNTER — Ambulatory Visit (INDEPENDENT_AMBULATORY_CARE_PROVIDER_SITE_OTHER): Payer: Medicare Other | Admitting: Internal Medicine

## 2019-08-19 ENCOUNTER — Encounter: Payer: Self-pay | Admitting: Internal Medicine

## 2019-08-19 ENCOUNTER — Other Ambulatory Visit: Payer: Self-pay

## 2019-08-19 VITALS — BP 132/88 | HR 89 | Temp 98.3°F | Ht 62.0 in | Wt 192.2 lb

## 2019-08-19 DIAGNOSIS — R946 Abnormal results of thyroid function studies: Secondary | ICD-10-CM

## 2019-08-19 DIAGNOSIS — R7989 Other specified abnormal findings of blood chemistry: Secondary | ICD-10-CM | POA: Diagnosis not present

## 2019-08-19 LAB — TSH: TSH: 1.77 u[IU]/mL (ref 0.35–4.50)

## 2019-08-19 LAB — T4, FREE: Free T4: 0.83 ng/dL (ref 0.60–1.60)

## 2019-08-19 NOTE — Patient Instructions (Signed)
-   Please stop for labs work  today

## 2019-08-19 NOTE — Progress Notes (Signed)
Name: Meghan Alexander  MRN/ DOB: [id number], 1955-11-25    Age/ Sex: 64 y.o., female     PCP: [name], MD   Reason for Endocrinology Evaluation: Elevated FT4     Initial Endocrinology Clinic Visit: 05/21/2019    PATIENT IDENTIFIER: Ms. Meghan Alexander is a 64 y.o., female with a past medical history of Follicular lymphoma. She has followed with [facility] since 05/21/2019 for consultative assistance with management of her elevated FT4.   HISTORICAL SUMMARY: The patient was first noted to have elevated FT4 in the setting of non-suppressed TSh in 04/2019.    She was started on chemo in 03/2019 with Rituxan, and Bendamustine  for the treatment of lymphoproliferative disease.  She was having fatigue and nausea at the time as well as constipation, sob and palpitations.    Sister with grave's disease.  SUBJECTIVE:   During last visit (05/21/2019): Normal FT4  Today (08/19/2019):  [name] is here for a follow up on elevated FT4.    Her weight has been stable  Continues on immunotherapy  Continues with fatigue She has ben having diarrhea for the past 2 weeks  Continues with palpitations  Hot flashes resolved.        ROS:  As per HPI.   HISTORY:  Past Medical History:  Past Medical History:  Diagnosis Date  . Allergy    year around allergies  . Arthritis    fingers, knees, neck, back-osteoarthristis  . Bronchitis    hx of  . Complication of anesthesia   . 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 GERD (gastroesophageal reflux disease)    hx of reflux-went away with elevating HOB   . Hypertension   . Pneumonia    hx of   . PONV (postoperative nausea and vomiting)    Past Surgical History:  Past Surgical History:  Procedure Laterality Date  . ABDOMINAL HYSTERECTOMY    . ADENOIDECTOMY    . cosmetic surgeries    . ESOPHAGEAL MANOMETRY N/A 12/02/2015   Procedure: ESOPHAGEAL MANOMETRY (EM);  Surgeon:  [name], MD;  Location: [facility];  Service: Endoscopy;  Laterality: N/A;  . ESOPHAGOGASTRODUODENOSCOPY N/A 12/02/2015   Procedure: ESOPHAGOGASTRODUODENOSCOPY (EGD);  Surgeon: [name], MD;  Location: [facility];  Service: Endoscopy;  Laterality: N/A;  . lymph node removal left leg     cat scratch surgery   . THROAT SURGERY     sleep apnea surgery   . TONSILLECTOMY     Social History:  reports that she has never smoked. She has never used smokeless tobacco. She reports that she does not drink alcohol or use drugs. Family History:  Family History  Problem Relation Age of Onset  . Lung cancer Father        1982 died from it  . Hernia Mother   . Breast cancer Maternal Aunt        multiple   . Breast cancer Maternal Grandmother   . Breast cancer Paternal Grandmother   . Breast cancer Cousin        cousins multiple   . Colon cancer Neg Hx   . Colon polyps Neg Hx   . Rectal cancer Neg Hx   . Stomach cancer Neg Hx      HOME MEDICATIONS: Allergies as of 08/19/2019      Reactions   Latex Other (See Comments)   Blisters / skin peeling Blisters / skin peeling BLISTERS RASH  Codeine Nausea And Vomiting, Other (See Comments)   hallucinations hallucinations HALLUCINATIONS.    Other Other (See Comments)   DermaPlast? Used after surgery - caused swelling, itching, and wound broke open   Hydrocodone Nausea And Vomiting   Oxycodone Nausea And Vomiting      Medication List       Accurate as of August 19, 2019 12:17 PM. If you have any questions, ask your nurse or doctor.        acyclovir 400 MG tablet Commonly known as: ZOVIRAX Take 1 tablet (400 mg total) by mouth daily.   aspirin EC 81 MG tablet Take 81 mg by mouth daily.   CULTURELLE DIGESTIVE HEALTH PO Take 1 capsule by mouth at bedtime.   dexamethasone 4 MG tablet Commonly known as: DECADRON TAKE 2 TABLETS BY MOUTH DAILY FOR 3 DAYS, STARTING THE DAY AFTER COMPLETEING EACH CHEMOTHERAPY    diphenhydrAMINE 25 MG tablet Commonly known as: BENADRYL Take 25 mg by mouth at bedtime.   DULCOLAX PO Take 1 tablet by mouth 2 (two) times daily.   lisinopril-hydrochlorothiazide 20-12.5 MG tablet Commonly known as: ZESTORETIC Take 1 tablet by mouth daily.   LORazepam 0.5 MG tablet Commonly known as: ATIVAN TAKE 1 TABLET(0.5 MG) BY MOUTH EVERY 8 HOURS   Melatonin 10 MG Caps Take 10 mg by mouth at bedtime.   meloxicam 15 MG tablet Commonly known as: MOBIC Take 15 mg by mouth daily.   metFORMIN 500 MG tablet Commonly known as: GLUCOPHAGE Take 500 mg by mouth daily.   metoprolol succinate 50 MG 24 hr tablet Commonly known as: TOPROL-XL Take 50 mg by mouth daily.   multivitamin tablet Take 1 tablet by mouth daily.   ondansetron 8 MG tablet Commonly known as: ZOFRAN TAKE 1 TABLET BY MOUTH EVERY 8 HOURS AS NEEDED FOR NAUSEA. STARTING 2 DAYS AFTER EACH CHEMOTHERAPY   predniSONE 10 MG tablet Commonly known as: DELTASONE Take 10 mg by mouth 2 (two) times daily.   prochlorperazine 10 MG tablet Commonly known as: COMPAZINE Take 1 tablet (10 mg total) by mouth every 6 (six) hours as needed for nausea or vomiting.   rosuvastatin 10 MG tablet Commonly known as: CRESTOR Take 10 mg by mouth daily.   venlafaxine 37.5 MG tablet Commonly known as: EFFEXOR Take 37.5 mg by mouth once.         OBJECTIVE:   PHYSICAL EXAM: VS: BP 132/88 (BP Location: Right Arm, Patient Position: Sitting, Cuff Size: Large)   Pulse 89   Temp 98.3 F (36.8 C)   Ht 5\' 2"  (1.575 m)   Wt 192 lb 3.2 oz (87.2 kg)   SpO2 99%   BMI 35.15 kg/m    EXAM: General: Pt appears well and is in NAD  Lungs: Clear with good BS bilat with no rales, rhonchi, or wheezes  Heart: Auscultation: RRR.  Extremities:  BL LE: No pretibial edema normal ROM and strength.  Mental Status: Judgment, insight: Intact Orientation: Oriented to time, place, and person Mood and affect: No depression, anxiety, or  agitation     DATA REVIEWED:  Results for Priest, Meghan "Alexander" (MRN [id number]) as of 08/19/2019 15:48  Ref. Range 08/19/2019 10:22  TSH Latest Ref Range: 0.35 - 4.50 uIU/mL 1.77  T4,Free(Direct) Latest Ref Range: 0.60 - 1.60 ng/dL 0.83     ASSESSMENT / PLAN / RECOMMENDATIONS:   Elevated FT4 in the setting of non-suppressed TSH :   - Pt is clinically euthyroid  - Repeat labs are normal -  No further endocrinology follow up is needed at this time    F/U PRN    Signed electronically by: Mack Guise, MD  [facility] Endocrinology  [facility] [address] [place], [place] [address] Phone: [phone] FAX: [phone]      CC: [name], [address], [place] [address] Phone: [phone]  Fax: [phone]   Return to Endocrinology clinic as below: Future Appointments  Date Time Provider [place]  10/03/2019  8:45 AM [facility] LAB 1 [facility] None  10/03/2019  9:20 AM Brunetta Genera, MD [facility] None  10/03/2019 10:15 AM [facility] INFUSION [facility] None

## 2019-10-02 ENCOUNTER — Other Ambulatory Visit: Payer: Self-pay | Admitting: *Deleted

## 2019-10-02 DIAGNOSIS — C8218 Follicular lymphoma grade II, lymph nodes of multiple sites: Secondary | ICD-10-CM

## 2019-10-02 MED FILL — Dexamethasone Sodium Phosphate Inj 100 MG/10ML: INTRAMUSCULAR | Qty: 1 | Status: AC

## 2019-10-02 NOTE — Progress Notes (Signed)
HEMATOLOGY/ONCOLOGY CLINIC NOTE  Date of Service: 10/03/2019  Patient Care Team: [name], MD as PCP - General (Internal Medicine)  CHIEF COMPLAINTS/PURPOSE OF CONSULTATION:  Recently diagnosed follicular lymphoma  HISTORY OF PRESENTING ILLNESS:   Meghan Alexander is a wonderful 64 y.o. female who has been referred to [place] by Dr Layne Benton for evaluation and management of possible lymphoproliferative disease.The pt reports that she is doing well overall.  The pt reports that she had an MRI earlier this month because of her back pain. She has had back pain for about 30 years, but in May, it got so bad that she could not move. The pain radiated around her hip and down to her knee. She could not even get in her car to go to the ER. She put herself on bedrest for 5 weeks which improved her symptoms. Now, her back pain is back to baseline.  Pt reported that, in the past 6 months, she has been moving her bowels 3-4x daily, which is much more than normal for her. Throughout the day, her stools get progressively looser. She went to the ER on 10/11/2018 for food poisoning from mayonnaise because she had blood in the stool and cramping. The symptoms began 2 hours after she ate the mayo. She was not placed on antibiotics or any other medications. No stool testing was done. Her symptoms resolved in 2 days.  She experienced night sweats up until 2006, and they returned 6 months ago. She does not take hormone replacements because of her FHx of breast cancer. She also reports some new fatigue, dry eyes, and headaches over the last 6 months. Denies unexpected weight loss. She has not been diagnosed with diabetes.  Of note prior to the patient's visit today, pt has had MRI lumbar spine wo contrast completed on 11/02/2018 with results revealing "Extensive retroperitoneal lymphadenopathy highly worrisome for neoplastic process such as lymphoproliferative disease. 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 There is mild central canal narrowing overall at this Level."  Most recent lab results (10/11/2018) of CBC is as follows: all values are WNL except for Potassium at 3.3, glucose bld at 212. 10/11/2018 Urinalysis revealed hazy appearance 10/11/2018 Lipase at 29  On review of systems, pt reports back pain, 3-4 bowel movements daily, dry eyes, headaches, night sweats, and denies unexpected weight loss, mouth sores, belly pain, changes in urination trouble swallowing, fever, chills, and any other symptoms.   On PMHx the pt reports total hysterectomy at 38 years ago due to ovarian cysts, lymphoreticulosis, sleep apnea related surgery, breast augmentation with no removal of breast tissue On Social Hx the pt reports never smoking On Family Hx the pt reports breast cancer on both sides of the family.   INTERVAL HISTORY  Meghan Alexander is a 64 y.o. female presenting today for the management and evaluation of her follicular lymphoma. The patient's last visit with [place] was on 07/24/19. The pt reports that she is doing well overall.  The pt reports she is good. Pt has gotten both doses of the COVID19 vaccine. She has had not new concerns over the last several months. Her knees and back still have been hurting and has an appointment for that. Pt's stool have been more frequent and is not persistent. She notices it with fatty foods and red meats. Pt does  take metformin.    Lab results today (10/03/19) of CBC w/diff and CMP is as follows: all values are WNL except for MCHC at 36.1, Lymph's Abs at 0.3K, Glucose at 216 10/03/19 of LDH at 227  On review of systems, pt denies infection issues, fever, chills, night sweats, rashes, abdominal pain, blood/mucus in stool, new lumps/bumps and any other symptoms.   MEDICAL HISTORY:  Past Medical  History:  Diagnosis Date  . Allergy    year around allergies  . Arthritis    fingers, knees, neck, back-osteoarthristis  . Bronchitis    hx of  . Complication of anesthesia   . 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 GERD (gastroesophageal reflux disease)    hx of reflux-went away with elevating HOB   . Hypertension   . Pneumonia    hx of   . PONV (postoperative nausea and vomiting)     SURGICAL HISTORY: Past Surgical History:  Procedure Laterality Date  . ABDOMINAL HYSTERECTOMY    . ADENOIDECTOMY    . cosmetic surgeries    . ESOPHAGEAL MANOMETRY N/A 12/02/2015   Procedure: ESOPHAGEAL MANOMETRY (EM);  Surgeon: [name], MD;  Location: [facility];  Service: Endoscopy;  Laterality: N/A;  . ESOPHAGOGASTRODUODENOSCOPY N/A 12/02/2015   Procedure: ESOPHAGOGASTRODUODENOSCOPY (EGD);  Surgeon: [name], MD;  Location: [facility];  Service: Endoscopy;  Laterality: N/A;  . lymph node removal left leg     cat scratch surgery   . THROAT SURGERY     sleep apnea surgery   . TONSILLECTOMY      SOCIAL HISTORY: Social History   Socioeconomic History  . Marital status: Married    Spouse name: Not on file  . Number of children: Not on file  . Years of education: Not on file  . Highest education level: Not on file  Occupational History  . Not on file  Tobacco Use  . Smoking status: Never Smoker  . Smokeless tobacco: Never Used  Vaping Use  . Vaping Use: Never used  Substance and Sexual Activity  . Alcohol use: No    Alcohol/week: 0.0 standard drinks  . Drug use: No  . Sexual activity: Not on file  Other Topics Concern  . 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  Difficulty of Paying Living Expenses:   Food Insecurity:   . Worried About Charity fundraiser in the Last Year:   . Arboriculturist in the Last Year:   Transportation Needs:   . Film/video editor (Medical):   Marland Kitchen Lack of Transportation (Non-Medical):   Physical Activity:   . Days of Exercise per Week:   . Minutes of Exercise per Session:   Stress:   . Feeling of Stress :   Social Connections:   . Frequency of Communication with Friends and Family:   . Frequency of Social Gatherings with Friends and Family:   . Attends Religious Services:   . Active Member of Clubs or Organizations:   . Attends Archivist Meetings:   Marland Kitchen Marital Status:   Intimate Partner Violence:   . Fear of Current or Ex-Partner:   .  Emotionally Abused:   Marland Kitchen Physically Abused:   . Sexually Abused:     FAMILY HISTORY: Family History  Problem Relation Age of Onset  . Lung cancer Father        1982 died from it  . Hernia Mother   . Breast cancer Maternal Aunt        multiple   . Breast cancer Maternal Grandmother   . Breast cancer Paternal Grandmother   . Breast cancer Cousin        cousins multiple   . Colon cancer Neg Hx   . Colon polyps Neg Hx   . Rectal cancer Neg Hx   . Stomach cancer Neg Hx     ALLERGIES:  is allergic to latex, codeine, other, hydrocodone, and oxycodone.  MEDICATIONS:  Current Outpatient Medications  Medication Sig Dispense Refill  . acyclovir (ZOVIRAX) 400 MG tablet Take 1 tablet (400 mg total) by mouth daily. 30 tablet 6  . aspirin EC 81 MG tablet Take 81 mg by mouth daily.    Marland Kitchen dexamethasone (DECADRON) 4 MG tablet TAKE 2 TABLETS BY MOUTH DAILY FOR 3 DAYS, STARTING THE DAY AFTER COMPLETEING EACH CHEMOTHERAPY 30 tablet 0  . diphenhydrAMINE (BENADRYL) 25 MG tablet Take 25 mg by mouth at bedtime.    . Lactobacillus-Inulin (CULTURELLE DIGESTIVE HEALTH PO) Take 1 capsule by mouth at bedtime.    Marland Kitchen lisinopril-hydrochlorothiazide (ZESTORETIC) 20-12.5 MG tablet Take 1  tablet by mouth daily.    Marland Kitchen LORazepam (ATIVAN) 0.5 MG tablet TAKE 1 TABLET(0.5 MG) BY MOUTH EVERY 8 HOURS 30 tablet 0  . Magnesium Hydroxide (DULCOLAX PO) Take 1 tablet by mouth 2 (two) times daily.    . Melatonin 10 MG CAPS Take 10 mg by mouth at bedtime.     . meloxicam (MOBIC) 15 MG tablet Take 15 mg by mouth daily.    . metFORMIN (GLUCOPHAGE) 500 MG tablet Take 500 mg by mouth daily.    . metoprolol succinate (TOPROL-XL) 50 MG 24 hr tablet Take 50 mg by mouth daily.    . Multiple Vitamin (MULTIVITAMIN) tablet Take 1 tablet by mouth daily.    . ondansetron (ZOFRAN) 8 MG tablet TAKE 1 TABLET BY MOUTH EVERY 8 HOURS AS NEEDED FOR NAUSEA. STARTING 2 DAYS AFTER EACH CHEMOTHERAPY 30 tablet 3  . predniSONE (DELTASONE) 10 MG tablet Take 10 mg by mouth 2 (two) times daily.    . prochlorperazine (COMPAZINE) 10 MG tablet Take 1 tablet (10 mg total) by mouth every 6 (six) hours as needed for nausea or vomiting. 30 tablet 0  . rosuvastatin (CRESTOR) 10 MG tablet Take 10 mg by mouth daily.    Marland Kitchen venlafaxine (EFFEXOR) 37.5 MG tablet Take 37.5 mg by mouth once.     No current facility-administered medications for this visit.    REVIEW OF SYSTEMS: A 10+ POINT REVIEW OF SYSTEMS WAS OBTAINED including neurology, dermatology, psychiatry, cardiac, respiratory, lymph, extremities, GI, GU, Musculoskeletal, constitutional, breasts, reproductive, HEENT.  All pertinent positives are noted in the HPI.  All others are negative.   PHYSICAL EXAMINATION: ECOG FS:1 - Symptomatic but completely ambulatory  Vitals:   10/03/19 0934  BP: (!) 165/84  Pulse: 93  Resp: 18  Temp: (!) 97.3 F (36.3 C)  SpO2: 97%   Wt Readings from Last 3 Encounters:  10/03/19 195 lb 9.6 oz (88.7 kg)  08/19/19 192 lb 3.2 oz (87.2 kg)  07/26/19 195 lb (88.5 kg)   Body mass index is 35.78 kg/m.  GENERAL:alert, in no acute distress and comfortable SKIN: no acute rashes, no significant lesions EYES: conjunctiva are pink and  non-injected, sclera anicteric OROPHARYNX: MMM, no exudates, no oropharyngeal erythema or ulceration NECK: supple, no JVD LYMPH:  no palpable lymphadenopathy in the cervical, axillary or inguinal regions LUNGS: clear to auscultation b/l with normal respiratory effort HEART: regular rate & rhythm ABDOMEN:  normoactive bowel sounds , non tender, not distended. Extremity: no pedal edema PSYCH: alert & oriented x 3 with fluent speech NEURO: no focal motor/sensory deficits  LABORATORY DATA:  I have reviewed the data as listed  . CBC Latest Ref Rng & Units 10/03/2019 07/24/2019 06/27/2019  WBC 4.0 - 10.5 K/uL 4.3 3.0(L) 2.5(L)  Hemoglobin 12.0 - 15.0 g/dL 13.0 13.5 13.3  Hematocrit 36 - 46 % 36.0 38.0 36.5  Platelets 150 - 400 K/uL 186 139(L) 163    . CMP Latest Ref Rng & Units 10/03/2019 07/24/2019 06/27/2019  Glucose 70 - 99 mg/dL 216(H) 208(H) 262(H)  BUN 8 - 23 mg/dL 11 7(L) 12  Creatinine 0.44 - 1.00 mg/dL 0.84 0.90 0.78  Sodium 135 - 145 mmol/L 139 143 142  Potassium 3.5 - 5.1 mmol/L 3.9 4.0 3.6  Chloride 98 - 111 mmol/L 101 99 101  CO2 22 - 32 mmol/L 24 29 28   Calcium 8.9 - 10.3 mg/dL 9.3 9.7 9.2  Total Protein 6.5 - 8.1 g/dL 6.6 7.1 6.9  Total Bilirubin 0.3 - 1.2 mg/dL 0.4 0.6 0.5  Alkaline Phos 38 - 126 U/L 61 59 60  AST 15 - 41 U/L 25 30 29   ALT 0 - 44 U/L 32 36 42   . Lab Results  Component Value Date   LDH 227 (H) 10/03/2019   05/14/2019 CT ANGIO CHEST PE W OR WO CONTRAST (Accession [id number]):   03/08/2019 Lymph Node Biopsy [id number]):      RADIOGRAPHIC STUDIES: I have personally reviewed the radiological images as listed and agreed with the findings in the report. No results found.  ASSESSMENT & PLAN:   #1 Stage III/IV follicular lymphoma - likely low grade but accurate grading difficult  11/02/2018 MRI lumbar spine wo contrast revealed "Extensive retroperitoneal lymphadenopathy highly worrisome for neoplastic process such as lymphoproliferative  disease. Spondylosis worst at L4-5 where there is narrowing in the right subarticular recess predominantly due to a synovial cyst off the medial margin of the right facet with encroachment on the descending right L5 root. There is mild central canal narrowing overall at this Level."  12/04/2018 CT CAP revealed "Mild-to-moderate abdominal and pelvic lymphadenopathy and new 9 mm left superior mediastinal lymph node, highly suspicious for lymphoproliferative disorder. Stable 8 mm lingular pulmonary nodule, consistent with benign Etiology. Moderate hepatic steatosis. 1.3 cm indeterminate low-attenuation lesion in the right lobe. Recommend continued attention on follow-up CT."  12/18/2018 lymph node biopsy pathology revealing a follicular lymphoma- likely low grade but accurate grading not possible on limited sampling.  02/06/2019 PET/CT scan ([id number]) revealed "1. Bulky hypermetabolic lymphadenopathy in the abdomen and upper pelvis, as described. This is associated with mild hypermetabolic lymphadenopathy in the upper mediastinum and right axilla. Hypermetabolic lymphadenopathy represents a combination of Deauville 4 and Deauville 5 disease. 2. 8 mm nodule in the lingula without hypermetabolism. Attention on follow-up recommended. 3. Hepatic steatosis. 4.  Aortic Atherosclerois (ICD10-170.0)."  03/08/2019 Lymph Node Biopsy ([id number]) revealed "LYMPH NODE, NEEDLE CORE BIOPSY: - Follicular lymphoma (grade 1-2 of 3) with elevated proliferation rate."  2. Mild treatment related thrombocytopenia- resolved.  PLAN: -Discussed pt  labwork today, 10/03/19; of CBC w/diff and CMP is as follows: all values are WNL except for MCHC at 36.1, Lymph's Abs at 0.3K, Glucose at 216 -Discussed 10/03/19 of LDH at 227 -Advised metformin can cause diarrhea  -Advised on maintenance Rituxan- every 2-3 months for 2 years -Advised that Rituxan will be more tolerable and less fatiguing than chemotherapy -Recommended that  the pt continue to eat well, drink at least 48-64 oz of water each day, and walk 20-30 minutes each day, and other managements of diabetes  -Will start maintenance Rituxan from today. -Will see back in 2 Months with Labs   FOLLOW UP: Plz schedule next cycle of maintenance Rituxan in 2 months with labs and MD visit   The total time spent in the appt was 20 minutes and more than 50% was on counseling and direct patient cares.  All of the patient's questions were answered with apparent satisfaction. The patient knows to call the clinic with any problems, questions or concerns.  Sullivan Lone MD MS AAHIVMS [facility] Hematology/Oncology Physician [facility]  (Office):       [phone] (Work cell):  [phone] (Fax):           [phone]  10/03/2019 10:06 AM  I, Dawayne Cirri am acting as a Education administrator for Dr. Sullivan Lone.   .I have reviewed the above documentation for accuracy and completeness, and I agree with the above. Brunetta Genera MD

## 2019-10-03 ENCOUNTER — Inpatient Hospital Stay (HOSPITAL_BASED_OUTPATIENT_CLINIC_OR_DEPARTMENT_OTHER): Payer: Medicare Other | Admitting: Hematology

## 2019-10-03 ENCOUNTER — Inpatient Hospital Stay: Payer: Medicare Other | Attending: Hematology

## 2019-10-03 ENCOUNTER — Inpatient Hospital Stay: Payer: Medicare Other

## 2019-10-03 ENCOUNTER — Other Ambulatory Visit: Payer: Self-pay

## 2019-10-03 VITALS — BP 132/79 | HR 86 | Temp 98.5°F | Resp 17

## 2019-10-03 VITALS — BP 165/84 | HR 93 | Temp 97.3°F | Resp 18 | Ht 62.0 in | Wt 195.6 lb

## 2019-10-03 DIAGNOSIS — K760 Fatty (change of) liver, not elsewhere classified: Secondary | ICD-10-CM | POA: Insufficient documentation

## 2019-10-03 DIAGNOSIS — Z801 Family history of malignant neoplasm of trachea, bronchus and lung: Secondary | ICD-10-CM | POA: Diagnosis not present

## 2019-10-03 DIAGNOSIS — C8218 Follicular lymphoma grade II, lymph nodes of multiple sites: Secondary | ICD-10-CM

## 2019-10-03 DIAGNOSIS — Z5112 Encounter for antineoplastic immunotherapy: Secondary | ICD-10-CM | POA: Insufficient documentation

## 2019-10-03 DIAGNOSIS — R5383 Other fatigue: Secondary | ICD-10-CM | POA: Diagnosis not present

## 2019-10-03 DIAGNOSIS — Z7189 Other specified counseling: Secondary | ICD-10-CM

## 2019-10-03 DIAGNOSIS — I700 Atherosclerosis of aorta: Secondary | ICD-10-CM | POA: Diagnosis not present

## 2019-10-03 DIAGNOSIS — M549 Dorsalgia, unspecified: Secondary | ICD-10-CM | POA: Diagnosis not present

## 2019-10-03 DIAGNOSIS — Z8379 Family history of other diseases of the digestive system: Secondary | ICD-10-CM | POA: Diagnosis not present

## 2019-10-03 DIAGNOSIS — Z79899 Other long term (current) drug therapy: Secondary | ICD-10-CM | POA: Insufficient documentation

## 2019-10-03 DIAGNOSIS — Z803 Family history of malignant neoplasm of breast: Secondary | ICD-10-CM | POA: Insufficient documentation

## 2019-10-03 DIAGNOSIS — E119 Type 2 diabetes mellitus without complications: Secondary | ICD-10-CM | POA: Insufficient documentation

## 2019-10-03 LAB — CBC WITH DIFFERENTIAL (CANCER CENTER ONLY)
Abs Immature Granulocytes: 0.02 10*3/uL (ref 0.00–0.07)
Basophils Absolute: 0 10*3/uL (ref 0.0–0.1)
Basophils Relative: 1 %
Eosinophils Absolute: 0.1 10*3/uL (ref 0.0–0.5)
Eosinophils Relative: 3 %
HCT: 36 % (ref 36.0–46.0)
Hemoglobin: 13 g/dL (ref 12.0–15.0)
Immature Granulocytes: 1 %
Lymphocytes Relative: 7 %
Lymphs Abs: 0.3 10*3/uL — ABNORMAL LOW (ref 0.7–4.0)
MCH: 32.5 pg (ref 26.0–34.0)
MCHC: 36.1 g/dL — ABNORMAL HIGH (ref 30.0–36.0)
MCV: 90 fL (ref 80.0–100.0)
Monocytes Absolute: 0.6 10*3/uL (ref 0.1–1.0)
Monocytes Relative: 14 %
Neutro Abs: 3.2 10*3/uL (ref 1.7–7.7)
Neutrophils Relative %: 74 %
Platelet Count: 186 10*3/uL (ref 150–400)
RBC: 4 MIL/uL (ref 3.87–5.11)
RDW: 11.8 % (ref 11.5–15.5)
WBC Count: 4.3 10*3/uL (ref 4.0–10.5)
nRBC: 0 % (ref 0.0–0.2)

## 2019-10-03 LAB — CMP (CANCER CENTER ONLY)
ALT: 32 U/L (ref 0–44)
AST: 25 U/L (ref 15–41)
Albumin: 3.9 g/dL (ref 3.5–5.0)
Alkaline Phosphatase: 61 U/L (ref 38–126)
Anion gap: 14 (ref 5–15)
BUN: 11 mg/dL (ref 8–23)
CO2: 24 mmol/L (ref 22–32)
Calcium: 9.3 mg/dL (ref 8.9–10.3)
Chloride: 101 mmol/L (ref 98–111)
Creatinine: 0.84 mg/dL (ref 0.44–1.00)
GFR, Est AFR Am: >60 mL/min (ref >60)
GFR, Estimated: >60 mL/min (ref >60)
Glucose, Bld: 216 mg/dL — ABNORMAL HIGH (ref 70–99)
Potassium: 3.9 mmol/L (ref 3.5–5.1)
Sodium: 139 mmol/L (ref 135–145)
Total Bilirubin: 0.4 mg/dL (ref 0.3–1.2)
Total Protein: 6.6 g/dL (ref 6.5–8.1)

## 2019-10-03 LAB — LACTATE DEHYDROGENASE: LDH: 227 U/L — ABNORMAL HIGH (ref 98–192)

## 2019-10-03 MED ORDER — DIPHENHYDRAMINE HCL 25 MG PO CAPS
50.0000 mg | ORAL_CAPSULE | Freq: Once | ORAL | Status: AC
  Administered 2019-10-03: 50 mg via ORAL

## 2019-10-03 MED ORDER — DIPHENHYDRAMINE HCL 25 MG PO CAPS
ORAL_CAPSULE | ORAL | Status: AC
  Filled 2019-10-03: qty 2

## 2019-10-03 MED ORDER — SODIUM CHLORIDE 0.9 % IV SOLN
10.0000 mg | Freq: Once | INTRAVENOUS | Status: AC
  Administered 2019-10-03: 10 mg via INTRAVENOUS
  Filled 2019-10-03: qty 10

## 2019-10-03 MED ORDER — SODIUM CHLORIDE 0.9 % IV SOLN
Freq: Once | INTRAVENOUS | Status: AC
  Filled 2019-10-03: qty 250

## 2019-10-03 MED ORDER — SODIUM CHLORIDE 0.9 % IV SOLN
375.0000 mg/m2 | Freq: Once | INTRAVENOUS | Status: AC
  Administered 2019-10-03: 700 mg via INTRAVENOUS
  Filled 2019-10-03: qty 50

## 2019-10-03 MED ORDER — SODIUM CHLORIDE 0.9 % IV SOLN
10.0000 mg | Freq: Once | INTRAVENOUS | Status: DC
  Filled 2019-10-03: qty 1

## 2019-10-03 MED ORDER — PALONOSETRON HCL INJECTION 0.25 MG/5ML
INTRAVENOUS | Status: AC
  Filled 2019-10-03: qty 5

## 2019-10-03 MED ORDER — FAMOTIDINE IN NACL 20-0.9 MG/50ML-% IV SOLN
INTRAVENOUS | Status: AC
  Filled 2019-10-03: qty 50

## 2019-10-03 MED ORDER — ACETAMINOPHEN 325 MG PO TABS
650.0000 mg | ORAL_TABLET | Freq: Once | ORAL | Status: AC
  Administered 2019-10-03: 650 mg via ORAL

## 2019-10-03 MED ORDER — FAMOTIDINE IN NACL 20-0.9 MG/50ML-% IV SOLN
20.0000 mg | Freq: Once | INTRAVENOUS | Status: AC
  Administered 2019-10-03: 20 mg via INTRAVENOUS

## 2019-10-03 MED ORDER — ACETAMINOPHEN 325 MG PO TABS
ORAL_TABLET | ORAL | Status: AC
  Filled 2019-10-03: qty 2

## 2019-10-03 NOTE — Patient Instructions (Signed)
Rituximab injection What is this medicine? RITUXIMAB (ri TUX i mab) is a monoclonal antibody. It is used to treat certain types of cancer like non-Hodgkin lymphoma and chronic lymphocytic leukemia. It is also used to treat rheumatoid arthritis, granulomatosis with polyangiitis (or Wegener's granulomatosis), microscopic polyangiitis, and pemphigus vulgaris. This medicine may be used for other purposes; ask your health care provider or pharmacist if you have questions. COMMON BRAND NAME(S): Rituxan, RUXIENCE What should I tell my health care provider before I take this medicine? They need to know if you have any of these conditions:  heart disease  infection (especially a virus infection such as hepatitis B, chickenpox, cold sores, or herpes)  immune system problems  irregular heartbeat  kidney disease  low blood counts, like low white cell, platelet, or red cell counts  lung or breathing disease, like asthma  recently received or scheduled to receive a vaccine  an unusual or allergic reaction to rituximab, other medicines, foods, dyes, or preservatives  pregnant or trying to get pregnant  breast-feeding How should I use this medicine? This medicine is for infusion into a vein. It is administered in a hospital or clinic by a specially trained health care professional. A special MedGuide will be given to you by the pharmacist with each prescription and refill. Be sure to read this information carefully each time. Talk to your pediatrician regarding the use of this medicine in children. This medicine is not approved for use in children. Overdosage: If you think you have taken too much of this medicine contact a poison control center or emergency room at once. NOTE: This medicine is only for you. Do not share this medicine with others. What if I miss a dose? It is important not to miss a dose. Call your doctor or health care professional if you are unable to keep an appointment. What  may interact with this medicine?  cisplatin  live virus vaccines This list may not describe all possible interactions. Give your health care provider a list of all the medicines, herbs, non-prescription drugs, or dietary supplements you use. Also tell them if you smoke, drink alcohol, or use illegal drugs. Some items may interact with your medicine. What should I watch for while using this medicine? Your condition will be monitored carefully while you are receiving this medicine. You may need blood work done while you are taking this medicine. This medicine can cause serious allergic reactions. To reduce your risk you may need to take medicine before treatment with this medicine. Take your medicine as directed. In some patients, this medicine may cause a serious brain infection that may cause death. If you have any problems seeing, thinking, speaking, walking, or standing, tell your healthcare professional right away. If you cannot reach your healthcare professional, urgently seek other source of medical care. Call your doctor or health care professional for advice if you get a fever, chills or sore throat, or other symptoms of a cold or flu. Do not treat yourself. This drug decreases your body's ability to fight infections. Try to avoid being around people who are sick. Do not become pregnant while taking this medicine or for at least 12 months after stopping it. Women should inform their doctor if they wish to become pregnant or think they might be pregnant. There is a potential for serious side effects to an unborn child. Talk to your health care professional or pharmacist for more information. Do not breast-feed an infant while taking this medicine or for at   least 6 months after stopping it. What side effects may I notice from receiving this medicine? Side effects that you should report to your doctor or health care professional as soon as possible:  allergic reactions like skin rash, itching or  hives; swelling of the face, lips, or tongue  breathing problems  chest pain  changes in vision  diarrhea  headache with fever, neck stiffness, sensitivity to light, nausea, or confusion  fast, irregular heartbeat  loss of memory  low blood counts - this medicine may decrease the number of white blood cells, red blood cells and platelets. You may be at increased risk for infections and bleeding.  mouth sores  problems with balance, talking, or walking  redness, blistering, peeling or loosening of the skin, including inside the mouth  signs of infection - fever or chills, cough, sore throat, pain or difficulty passing urine  signs and symptoms of kidney injury like trouble passing urine or change in the amount of urine  signs and symptoms of liver injury like dark yellow or brown urine; general ill feeling or flu-like symptoms; light-colored stools; loss of appetite; nausea; right upper belly pain; unusually weak or tired; yellowing of the eyes or skin  signs and symptoms of low blood pressure like dizziness; feeling faint or lightheaded, falls; unusually weak or tired  stomach pain  swelling of the ankles, feet, hands  unusual bleeding or bruising  vomiting Side effects that usually do not require medical attention (report to your doctor or health care professional if they continue or are bothersome):  headache  joint pain  muscle cramps or muscle pain  nausea  tiredness This list may not describe all possible side effects. Call your doctor for medical advice about side effects. You may report side effects to FDA at 1-800-FDA-1088. Where should I keep my medicine? This drug is given in a hospital or clinic and will not be stored at home. NOTE: This sheet is a summary. It may not cover all possible information. If you have questions about this medicine, talk to your doctor, pharmacist, or health care provider.  2020 Elsevier/Gold Standard (2018-05-23  22:01:36) Bendamustine Injection What is this medicine? BENDAMUSTINE (BEN da MUS teen) is a chemotherapy drug. It is used to treat chronic lymphocytic leukemia and non-Hodgkin lymphoma. This medicine may be used for other purposes; ask your health care provider or pharmacist if you have questions. COMMON BRAND NAME(S): BELRAPZO, BENDEKA, Treanda What should I tell my health care provider before I take this medicine? They need to know if you have any of these conditions:  infection (especially a virus infection such as chickenpox, cold sores, or herpes)  kidney disease  liver disease  an unusual or allergic reaction to bendamustine, mannitol, other medicines, foods, dyes, or preservatives  pregnant or trying to get pregnant  breast-feeding How should I use this medicine? This medicine is for infusion into a vein. It is given by a health care professional in a hospital or clinic setting. Talk to your pediatrician regarding the use of this medicine in children. Special care may be needed. Overdosage: If you think you have taken too much of this medicine contact a poison control center or emergency room at once. NOTE: This medicine is only for you. Do not share this medicine with others. What if I miss a dose? It is important not to miss your dose. Call your doctor or health care professional if you are unable to keep an appointment. What may interact with this   medicine? Do not take this medicine with any of the following medications:  clozapine This medicine may also interact with the following medications:  atazanavir  cimetidine  ciprofloxacin  enoxacin  fluvoxamine  medicines for seizures like carbamazepine and phenobarbital  mexiletine  rifampin  tacrine  thiabendazole  zileuton This list may not describe all possible interactions. Give your health care provider a list of all the medicines, herbs, non-prescription drugs, or dietary supplements you use. Also tell  them if you smoke, drink alcohol, or use illegal drugs. Some items may interact with your medicine. What should I watch for while using this medicine? This drug may make you feel generally unwell. This is not uncommon, as chemotherapy can affect healthy cells as well as cancer cells. Report any side effects. Continue your course of treatment even though you feel ill unless your doctor tells you to stop. You may need blood work done while you are taking this medicine. Call your doctor or healthcare provider for advice if you get a fever, chills or sore throat, or other symptoms of a cold or flu. Do not treat yourself. This drug decreases your body's ability to fight infections. Try to avoid being around people who are sick. This medicine may cause serious skin reactions. They can happen weeks to months after starting the medicine. Contact your healthcare provider right away if you notice fevers or flu-like symptoms with a rash. The rash may be red or purple and then turn into blisters or peeling of the skin. Or, you might notice a red rash with swelling of the face, lips or lymph nodes in your neck or under your arms. This medicine may increase your risk to bruise or bleed. Call your doctor or healthcare provider if you notice any unusual bleeding. Talk to your doctor about your risk of cancer. You may be more at risk for certain types of cancers if you take this medicine. Do not become pregnant while taking this medicine or for at least 6 months after stopping it. Women should inform their doctor if they wish to become pregnant or think they might be pregnant. Men should not father a child while taking this medicine and for at least 3 months after stopping it. There is a potential for serious side effects to an unborn child. Talk to your healthcare provider or pharmacist for more information. Do not breast-feed an infant while taking this medicine or for at least 1 week after stopping it. This medicine may  make it more difficult to father a child. You should talk with your doctor or healthcare provider if you are concerned about your fertility. What side effects may I notice from receiving this medicine? Side effects that you should report to your doctor or health care professional as soon as possible:  allergic reactions like skin rash, itching or hives, swelling of the face, lips, or tongue  low blood counts - this medicine may decrease the number of white blood cells, red blood cells and platelets. You may be at increased risk for infections and bleeding.  rash, fever, and swollen lymph nodes  redness, blistering, peeling, or loosening of the skin, including inside the mouth  signs of infection like fever or chills, cough, sore throat, pain or difficulty passing urine  signs of decreased platelets or bleeding like bruising, pinpoint red spots on the skin, black, tarry stools, blood in the urine  signs of decreased red blood cells like being unusually weak or tired, fainting spells, lightheadedness  signs   and symptoms of kidney injury like trouble passing urine or change in the amount of urine  signs and symptoms of liver injury like dark yellow or brown urine; general ill feeling or flu-like symptoms; light-colored stools; loss of appetite; nausea; right upper belly pain; unusually weak or tired; yellowing of the eyes or skin Side effects that usually do not require medical attention (report to your doctor or health care professional if they continue or are bothersome):  constipation  decreased appetite  diarrhea  headache  mouth sores  nausea, vomiting  tiredness This list may not describe all possible side effects. Call your doctor for medical advice about side effects. You may report side effects to FDA at 1-800-FDA-1088. Where should I keep my medicine? This drug is given in a hospital or clinic and will not be stored at home. NOTE: This sheet is a summary. It may not  cover all possible information. If you have questions about this medicine, talk to your doctor, pharmacist, or health care provider.  2020 Elsevier/Gold Standard (2018-07-03 10:26:46)  

## 2019-10-03 NOTE — Progress Notes (Signed)
Per Dr. Irene Limbo, patient to start Rituximab maintenance therapy today, 10/03/19. Bendamustine has been removed from treatment plan.   Leron Croak, PharmD, BCPS PGY2 Hematology/Oncology Pharmacy Resident 10/03/2019 10:50 AM

## 2019-10-07 ENCOUNTER — Telehealth: Payer: Self-pay | Admitting: Hematology

## 2019-10-07 NOTE — Telephone Encounter (Signed)
Scheduled per 06/10 los, patient has been called and notified.

## 2019-12-04 MED FILL — Dexamethasone Sodium Phosphate Inj 100 MG/10ML: INTRAMUSCULAR | Qty: 1 | Status: AC

## 2019-12-04 NOTE — Progress Notes (Signed)
HEMATOLOGY/ONCOLOGY CLINIC NOTE  Date of Service: 12/05/2019  Patient Care Team: [name], MD as PCP - General (Internal Medicine)  CHIEF COMPLAINTS/PURPOSE OF CONSULTATION:  Recently diagnosed follicular lymphoma  HISTORY OF PRESENTING ILLNESS:   Meghan Alexander is a wonderful 64 y.o. female who has been referred to [place] by Dr Layne Benton for evaluation and management of possible lymphoproliferative disease.The pt reports that she is doing well overall.  The pt reports that she had an MRI earlier this month because of her back pain. She has had back pain for about 30 years, but in May, it got so bad that she could not move. The pain radiated around her hip and down to her knee. She could not even get in her car to go to the ER. She put herself on bedrest for 5 weeks which improved her symptoms. Now, her back pain is back to baseline.  Pt reported that, in the past 6 months, she has been moving her bowels 3-4x daily, which is much more than normal for her. Throughout the day, her stools get progressively looser. She went to the ER on 10/11/2018 for food poisoning from mayonnaise because she had blood in the stool and cramping. The symptoms began 2 hours after she ate the mayo. She was not placed on antibiotics or any other medications. No stool testing was done. Her symptoms resolved in 2 days.  She experienced night sweats up until 2006, and they returned 6 months ago. She does not take hormone replacements because of her FHx of breast cancer. She also reports some new fatigue, dry eyes, and headaches over the last 6 months. Denies unexpected weight loss. She has not been diagnosed with diabetes.  Of note prior to the patient's visit today, pt has had MRI lumbar spine wo contrast completed on 11/02/2018 with results revealing "Extensive retroperitoneal lymphadenopathy highly worrisome for neoplastic process such as lymphoproliferative disease. 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 There is mild central canal narrowing overall at this Level."  Most recent lab results (10/11/2018) of CBC is as follows: all values are WNL except for Potassium at 3.3, glucose bld at 212. 10/11/2018 Urinalysis revealed hazy appearance 10/11/2018 Lipase at 29  On review of systems, pt reports back pain, 3-4 bowel movements daily, dry eyes, headaches, night sweats, and denies unexpected weight loss, mouth sores, belly pain, changes in urination trouble swallowing, fever, chills, and any other symptoms.   On PMHx the pt reports total hysterectomy at 38 years ago due to ovarian cysts, lymphoreticulosis, sleep apnea related surgery, breast augmentation with no removal of breast tissue On Social Hx the pt reports never smoking On Family Hx the pt reports breast cancer on both sides of the family.   INTERVAL HISTORY  Meghan Alexander is a 64 y.o. female presenting today for the management and evaluation of her follicular lymphoma. The patient's last visit with [place] was on 10/03/19. The pt reports that she is doing well overall.  The pt reports she is good. She has been having trouble walking with a sense of loss of balance. Pt has not fallen but she does have a loss of balance that comes and goes. This is new within the last 2 months. She did have a medication changes. Pt keeps side stepping and does not think she is compensating for  an injury. She is always moving to the right and cannot stop herself. This is happening about 2-3 times a month. Pt thinks these changes may be due to her blood pressure.   Lab results today (12/05/19) of CBC w/diff and CMP is as follows: all values are WNL except for WBC at 3.3K, Lymph abs at 0.3K, Glucose at 200 12/05/19 of LDH at 358  On review of systems, pt reports healthy appetite and denies  dizziness, headaches, change of vision, ear pain, neck pain, flashing lights, sweating, weakness/numness in hands or feet, fever, chills, change in speech, change in fine motor activity, new lumps/bumps, night sweats, skin rashes, changes in urinary or bowel habits and any other symptoms.   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 ([place]) 04/02/2019   GERD (gastroesophageal reflux disease)    hx of reflux-went away with elevating HOB    Hypertension    Pneumonia    hx of    PONV (postoperative nausea and vomiting)     SURGICAL HISTORY: Past Surgical History:  Procedure Laterality Date   ABDOMINAL HYSTERECTOMY     ADENOIDECTOMY     cosmetic surgeries     ESOPHAGEAL MANOMETRY N/A 12/02/2015   Procedure: ESOPHAGEAL MANOMETRY (EM);  Surgeon: [name], MD;  Location: [facility];  Service: Endoscopy;  Laterality: N/A;   ESOPHAGOGASTRODUODENOSCOPY N/A 12/02/2015   Procedure: ESOPHAGOGASTRODUODENOSCOPY (EGD);  Surgeon: [name], MD;  Location: [facility];  Service: Endoscopy;  Laterality: N/A;   lymph node removal left leg     cat scratch surgery    THROAT SURGERY     sleep apnea surgery    TONSILLECTOMY      SOCIAL HISTORY: Social History   Socioeconomic History   Marital status: Married    Spouse name: Not on file   Number of children: Not on file   Years of education: Not on file   Highest education level: Not on file  Occupational History   Not on file  Tobacco Use   Smoking status: Never Smoker   Smokeless tobacco: Never Used  Vaping Use   Vaping Use: Never used  Substance and Sexual Activity   Alcohol use: No    Alcohol/week: 0.0 standard drinks   Drug use: No   Sexual activity: Not on file  Other Topics  Concern   Not on file  Social History Narrative   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 [employer]   Never smoker never drinker   No drugs   Multiple allergies   Lives at home with her husband of 8 years since 2008   Social Determinants of Health   Financial Resource Strain:    Difficulty of Paying Living Expenses:   Food Insecurity:    Worried About Charity fundraiser in the Last Year:    Arboriculturist in the Last Year:   Transportation Needs:    Film/video editor (Medical):    Lack of Transportation (Non-Medical):   Physical Activity:    Days of Exercise per Week:    Minutes of Exercise per Session:   Stress:    Feeling of Stress :   Social Connections:    Frequency of Communication with  Friends and Family:    Frequency of Social Gatherings with Friends and Family:    Attends Religious Services:    Active Member of Clubs or Organizations:    Attends Music therapist:    Marital Status:   Intimate Partner Violence:    Fear of Current or Ex-Partner:    Emotionally Abused:    Physically Abused:    Sexually Abused:     FAMILY HISTORY: Family History  Problem Relation Age of Onset   Lung cancer Father        1980-08-04 died from it   Hernia Mother    Breast cancer Maternal Aunt        multiple    Breast cancer Maternal Grandmother    Breast cancer Paternal Grandmother    Breast cancer Cousin        cousins multiple    Colon cancer Neg Hx    Colon polyps Neg Hx    Rectal cancer Neg Hx    Stomach cancer Neg Hx     ALLERGIES:  is allergic to latex, codeine, other, hydrocodone, and oxycodone.  MEDICATIONS:  Current Outpatient Medications  Medication Sig Dispense Refill   acyclovir (ZOVIRAX) 400 MG tablet Take 1 tablet (400 mg total) by mouth daily. 30 tablet 6   aspirin EC 81 MG tablet Take 81 mg by mouth daily.     dexamethasone (DECADRON) 4 MG tablet TAKE 2 TABLETS BY MOUTH DAILY FOR 3  DAYS, STARTING THE DAY AFTER COMPLETEING EACH CHEMOTHERAPY 30 tablet 0   diphenhydrAMINE (BENADRYL) 25 MG tablet Take 25 mg by mouth at bedtime.     Lactobacillus-Inulin (CULTURELLE DIGESTIVE HEALTH PO) Take 1 capsule by mouth at bedtime.     lisinopril-hydrochlorothiazide (ZESTORETIC) 20-12.5 MG tablet Take 1 tablet by mouth daily.     LORazepam (ATIVAN) 0.5 MG tablet TAKE 1 TABLET(0.5 MG) BY MOUTH EVERY 8 HOURS 30 tablet 0   Magnesium Hydroxide (DULCOLAX PO) Take 1 tablet by mouth 2 (two) times daily.     Melatonin 10 MG CAPS Take 10 mg by mouth at bedtime.      meloxicam (MOBIC) 15 MG tablet Take 15 mg by mouth daily.     metFORMIN (GLUCOPHAGE) 500 MG tablet Take 500 mg by mouth daily.     metoprolol succinate (TOPROL-XL) 50 MG 24 hr tablet Take 50 mg by mouth daily.     Multiple Vitamin (MULTIVITAMIN) tablet Take 1 tablet by mouth daily.     ondansetron (ZOFRAN) 8 MG tablet TAKE 1 TABLET BY MOUTH EVERY 8 HOURS AS NEEDED FOR NAUSEA. STARTING 2 DAYS AFTER EACH CHEMOTHERAPY 30 tablet 3   predniSONE (DELTASONE) 10 MG tablet Take 10 mg by mouth 2 (two) times daily.     prochlorperazine (COMPAZINE) 10 MG tablet Take 1 tablet (10 mg total) by mouth every 6 (six) hours as needed for nausea or vomiting. 30 tablet 0   rosuvastatin (CRESTOR) 10 MG tablet Take 10 mg by mouth daily.     venlafaxine (EFFEXOR) 37.5 MG tablet Take 37.5 mg by mouth once.     No current facility-administered medications for this visit.   Facility-Administered Medications Ordered in Other Visits  Medication Dose Route Frequency Provider Last Rate Last Admin   0.9 %  sodium chloride infusion   Intravenous Once Brunetta Genera, MD       acetaminophen (TYLENOL) tablet 650 mg  650 mg Oral Once Brunetta Genera, MD       dexamethasone (DECADRON) 10  mg in sodium chloride 0.9 % 50 mL IVPB  10 mg Intravenous Once Brunetta Genera, MD       diphenhydrAMINE (BENADRYL) capsule 50 mg  50 mg Oral Once  Brunetta Genera, MD       famotidine (PEPCID) IVPB 20 mg premix  20 mg Intravenous Once Brunetta Genera, MD       riTUXimab-pvvr (RUXIENCE) 700 mg in sodium chloride 0.9 % 250 mL (2.1875 mg/mL) infusion  375 mg/m2 (Treatment Plan Recorded) Intravenous Once Brunetta Genera, MD        REVIEW OF SYSTEMS: A 10+ POINT REVIEW OF SYSTEMS WAS OBTAINED including neurology, dermatology, psychiatry, cardiac, respiratory, lymph, extremities, GI, GU, Musculoskeletal, constitutional, breasts, reproductive, HEENT.  All pertinent positives are noted in the HPI.  All others are negative.   PHYSICAL EXAMINATION: ECOG FS:1 - Symptomatic but completely ambulatory  Vitals:   12/05/19 0916  BP: (!) 148/74  Pulse: 87  Resp: 18  Temp: (!) 97.1 F (36.2 C)  SpO2: 98%   Wt Readings from Last 3 Encounters:  12/05/19 192 lb 14.4 oz (87.5 kg)  10/03/19 195 lb 9.6 oz (88.7 kg)  08/19/19 192 lb 3.2 oz (87.2 kg)   Body mass index is 35.28 kg/m.    GENERAL:alert, in no acute distress and comfortable SKIN: no acute rashes, no significant lesions EYES: conjunctiva are pink and non-injected, sclera anicteric OROPHARYNX: MMM, no exudates, no oropharyngeal erythema or ulceration NECK: supple, no JVD LYMPH:  no palpable lymphadenopathy in the cervical, axillary or inguinal regions LUNGS: clear to auscultation b/l with normal respiratory effort HEART: regular rate & rhythm ABDOMEN:  normoactive bowel sounds , non tender, not distended. Extremity: no pedal edema PSYCH: alert & oriented x 3 with fluent speech NEURO: no focal motor/sensory deficits  LABORATORY DATA:  I have reviewed the data as listed  . CBC Latest Ref Rng & Units 12/05/2019 10/03/2019 07/24/2019  WBC 4.0 - 10.5 K/uL 3.3(L) 4.3 3.0(L)  Hemoglobin 12.0 - 15.0 g/dL 13.0 13.0 13.5  Hematocrit 36 - 46 % 36.6 36.0 38.0  Platelets 150 - 400 K/uL 164 186 139(L)    . CMP Latest Ref Rng & Units 12/05/2019 10/03/2019 07/24/2019  Glucose  70 - 99 mg/dL 200(H) 216(H) 208(H)  BUN 8 - 23 mg/dL 17 11 7(L)  Creatinine 0.44 - 1.00 mg/dL 0.91 0.84 0.90  Sodium 135 - 145 mmol/L 138 139 143  Potassium 3.5 - 5.1 mmol/L 3.5 3.9 4.0  Chloride 98 - 111 mmol/L 98 101 99  CO2 22 - 32 mmol/L 26 24 29   Calcium 8.9 - 10.3 mg/dL 9.9 9.3 9.7  Total Protein 6.5 - 8.1 g/dL 6.9 6.6 7.1  Total Bilirubin 0.3 - 1.2 mg/dL 0.5 0.4 0.6  Alkaline Phos 38 - 126 U/L 65 61 59  AST 15 - 41 U/L 23 25 30   ALT 0 - 44 U/L 34 32 36   . Lab Results  Component Value Date   LDH 358 (H) 12/05/2019   05/14/2019 CT ANGIO CHEST PE W OR WO CONTRAST (Accession [id number]):   03/08/2019 Lymph Node Biopsy ([id number]):      RADIOGRAPHIC STUDIES: I have personally reviewed the radiological images as listed and agreed with the findings in the report. No results found.  ASSESSMENT & PLAN:   #1 Stage III/IV follicular lymphoma - likely low grade but accurate grading difficult  11/02/2018 MRI lumbar spine wo contrast revealed "Extensive retroperitoneal lymphadenopathy highly worrisome for neoplastic process such as lymphoproliferative  disease. Spondylosis worst at L4-5 where there is narrowing in the right subarticular recess predominantly due to a synovial cyst off the medial margin of the right facet with encroachment on the descending right L5 root. There is mild central canal narrowing overall at this Level."  12/04/2018 CT CAP revealed "Mild-to-moderate abdominal and pelvic lymphadenopathy and new 9 mm left superior mediastinal lymph node, highly suspicious for lymphoproliferative disorder. Stable 8 mm lingular pulmonary nodule, consistent with benign Etiology. Moderate hepatic steatosis. 1.3 cm indeterminate low-attenuation lesion in the right lobe. Recommend continued attention on follow-up CT."  12/18/2018 lymph node biopsy pathology revealing a follicular lymphoma- likely low grade but accurate grading not possible on limited sampling.  02/06/2019  PET/CT scan ([id number]) revealed "1. Bulky hypermetabolic lymphadenopathy in the abdomen and upper pelvis, as described. This is associated with mild hypermetabolic lymphadenopathy in the upper mediastinum and right axilla. Hypermetabolic lymphadenopathy represents a combination of Deauville 4 and Deauville 5 disease. 2. 8 mm nodule in the lingula without hypermetabolism. Attention on follow-up recommended. 3. Hepatic steatosis. 4.  Aortic Atherosclerois (ICD10-170.0)."  03/08/2019 Lymph Node Biopsy ([id number]) revealed "LYMPH NODE, NEEDLE CORE BIOPSY: - Follicular lymphoma (grade 1-2 of 3) with elevated proliferation rate."  2. Mild treatment related thrombocytopenia- resolved.  PLAN: -Discussed pt labwork today, 12/05/19; of CBC w/diff and CMP is as follows: all values are WNL except for WBC at 3.3K, Lymph abs at 0.3K, Glucose at 200 -Discussed 12/05/19 of LDH at 358 -Advised on maintenance Rituxan- every 2-3 months for 2 years -Advised that Rituxan will be more tolerable and less fatiguing than chemotherapy -Recommended that the pt continue to eat well, drink at least 48-64 oz of water each day, and walk 20-30 minutes each day, and other managements of diabetes  -Will continue with maintenance Rituxan  -Advised on loss of balance  -Recommends f/u with PCP -Will see back in 2 Months with Labs  FOLLOW UP: Plz schedule next 2 cycles of maintenance Rituxan every 2 months with labs and MD visits  The total time spent in the appt was 30 minutes and more than 50% was on counseling and direct patient cares.  All of the patient's questions were answered with apparent satisfaction. The patient knows to call the clinic with any problems, questions or concerns.   Sullivan Lone MD MS AAHIVMS [facility] Hematology/Oncology Physician [facility]  (Office):       [phone] (Work cell):  [phone] (Fax):           [phone]  12/05/2019 10:13 AM  I, Dawayne Cirri am acting  as a scribe for Dr. Sullivan Lone.   .I have reviewed the above documentation for accuracy and completeness, and I agree with the above. Brunetta Genera MD

## 2019-12-05 ENCOUNTER — Inpatient Hospital Stay: Payer: Medicare Other | Attending: Hematology

## 2019-12-05 ENCOUNTER — Other Ambulatory Visit: Payer: Self-pay

## 2019-12-05 ENCOUNTER — Inpatient Hospital Stay: Payer: Medicare Other

## 2019-12-05 ENCOUNTER — Inpatient Hospital Stay (HOSPITAL_BASED_OUTPATIENT_CLINIC_OR_DEPARTMENT_OTHER): Payer: Medicare Other | Admitting: Hematology

## 2019-12-05 VITALS — BP 148/74 | HR 87 | Temp 97.1°F | Resp 18 | Ht 62.0 in | Wt 192.9 lb

## 2019-12-05 VITALS — BP 126/72 | HR 80 | Temp 98.1°F | Resp 16

## 2019-12-05 DIAGNOSIS — Z801 Family history of malignant neoplasm of trachea, bronchus and lung: Secondary | ICD-10-CM | POA: Diagnosis not present

## 2019-12-05 DIAGNOSIS — M549 Dorsalgia, unspecified: Secondary | ICD-10-CM | POA: Diagnosis not present

## 2019-12-05 DIAGNOSIS — R5383 Other fatigue: Secondary | ICD-10-CM | POA: Diagnosis not present

## 2019-12-05 DIAGNOSIS — K760 Fatty (change of) liver, not elsewhere classified: Secondary | ICD-10-CM | POA: Insufficient documentation

## 2019-12-05 DIAGNOSIS — R61 Generalized hyperhidrosis: Secondary | ICD-10-CM | POA: Insufficient documentation

## 2019-12-05 DIAGNOSIS — C8218 Follicular lymphoma grade II, lymph nodes of multiple sites: Secondary | ICD-10-CM

## 2019-12-05 DIAGNOSIS — Z803 Family history of malignant neoplasm of breast: Secondary | ICD-10-CM | POA: Insufficient documentation

## 2019-12-05 DIAGNOSIS — Z7189 Other specified counseling: Secondary | ICD-10-CM

## 2019-12-05 DIAGNOSIS — I700 Atherosclerosis of aorta: Secondary | ICD-10-CM | POA: Diagnosis not present

## 2019-12-05 DIAGNOSIS — Z8379 Family history of other diseases of the digestive system: Secondary | ICD-10-CM | POA: Insufficient documentation

## 2019-12-05 DIAGNOSIS — Z79899 Other long term (current) drug therapy: Secondary | ICD-10-CM | POA: Insufficient documentation

## 2019-12-05 DIAGNOSIS — H04129 Dry eye syndrome of unspecified lacrimal gland: Secondary | ICD-10-CM | POA: Diagnosis not present

## 2019-12-05 DIAGNOSIS — Z5112 Encounter for antineoplastic immunotherapy: Secondary | ICD-10-CM | POA: Diagnosis not present

## 2019-12-05 LAB — CMP (CANCER CENTER ONLY)
ALT: 34 U/L (ref 0–44)
AST: 23 U/L (ref 15–41)
Albumin: 4.1 g/dL (ref 3.5–5.0)
Alkaline Phosphatase: 65 U/L (ref 38–126)
Anion gap: 14 (ref 5–15)
BUN: 17 mg/dL (ref 8–23)
CO2: 26 mmol/L (ref 22–32)
Calcium: 9.9 mg/dL (ref 8.9–10.3)
Chloride: 98 mmol/L (ref 98–111)
Creatinine: 0.91 mg/dL (ref 0.44–1.00)
GFR, Est AFR Am: >60 mL/min (ref >60)
GFR, Estimated: >60 mL/min (ref >60)
Glucose, Bld: 200 mg/dL — ABNORMAL HIGH (ref 70–99)
Potassium: 3.5 mmol/L (ref 3.5–5.1)
Sodium: 138 mmol/L (ref 135–145)
Total Bilirubin: 0.5 mg/dL (ref 0.3–1.2)
Total Protein: 6.9 g/dL (ref 6.5–8.1)

## 2019-12-05 LAB — CBC WITH DIFFERENTIAL/PLATELET
Abs Immature Granulocytes: 0.05 10*3/uL (ref 0.00–0.07)
Basophils Absolute: 0 10*3/uL (ref 0.0–0.1)
Basophils Relative: 1 %
Eosinophils Absolute: 0.1 10*3/uL (ref 0.0–0.5)
Eosinophils Relative: 3 %
HCT: 36.6 % (ref 36.0–46.0)
Hemoglobin: 13 g/dL (ref 12.0–15.0)
Immature Granulocytes: 2 %
Lymphocytes Relative: 10 %
Lymphs Abs: 0.3 10*3/uL — ABNORMAL LOW (ref 0.7–4.0)
MCH: 31.3 pg (ref 26.0–34.0)
MCHC: 35.5 g/dL (ref 30.0–36.0)
MCV: 88.2 fL (ref 80.0–100.0)
Monocytes Absolute: 0.5 10*3/uL (ref 0.1–1.0)
Monocytes Relative: 16 %
Neutro Abs: 2.2 10*3/uL (ref 1.7–7.7)
Neutrophils Relative %: 68 %
Platelets: 164 10*3/uL (ref 150–400)
RBC: 4.15 MIL/uL (ref 3.87–5.11)
RDW: 11.9 % (ref 11.5–15.5)
WBC: 3.3 10*3/uL — ABNORMAL LOW (ref 4.0–10.5)
nRBC: 0 % (ref 0.0–0.2)

## 2019-12-05 LAB — LACTATE DEHYDROGENASE: LDH: 358 U/L — ABNORMAL HIGH (ref 98–192)

## 2019-12-05 MED ORDER — DIPHENHYDRAMINE HCL 25 MG PO CAPS
50.0000 mg | ORAL_CAPSULE | Freq: Once | ORAL | Status: AC
  Administered 2019-12-05: 50 mg via ORAL

## 2019-12-05 MED ORDER — DIPHENHYDRAMINE HCL 25 MG PO CAPS
ORAL_CAPSULE | ORAL | Status: AC
  Filled 2019-12-05: qty 2

## 2019-12-05 MED ORDER — FAMOTIDINE IN NACL 20-0.9 MG/50ML-% IV SOLN
INTRAVENOUS | Status: AC
  Filled 2019-12-05: qty 50

## 2019-12-05 MED ORDER — ACETAMINOPHEN 325 MG PO TABS
ORAL_TABLET | ORAL | Status: AC
  Filled 2019-12-05: qty 2

## 2019-12-05 MED ORDER — ACETAMINOPHEN 325 MG PO TABS
650.0000 mg | ORAL_TABLET | Freq: Once | ORAL | Status: AC
  Administered 2019-12-05: 650 mg via ORAL

## 2019-12-05 MED ORDER — SODIUM CHLORIDE 0.9 % IV SOLN
375.0000 mg/m2 | Freq: Once | INTRAVENOUS | Status: AC
  Administered 2019-12-05: 700 mg via INTRAVENOUS
  Filled 2019-12-05: qty 50

## 2019-12-05 MED ORDER — FAMOTIDINE IN NACL 20-0.9 MG/50ML-% IV SOLN
20.0000 mg | Freq: Once | INTRAVENOUS | Status: AC
  Administered 2019-12-05: 20 mg via INTRAVENOUS

## 2019-12-05 MED ORDER — SODIUM CHLORIDE 0.9 % IV SOLN
Freq: Once | INTRAVENOUS | Status: AC
  Filled 2019-12-05: qty 250

## 2019-12-05 MED ORDER — SODIUM CHLORIDE 0.9 % IV SOLN
10.0000 mg | Freq: Once | INTRAVENOUS | Status: AC
  Administered 2019-12-05: 10 mg via INTRAVENOUS
  Filled 2019-12-05: qty 10

## 2019-12-06 ENCOUNTER — Telehealth: Payer: Self-pay | Admitting: Hematology

## 2019-12-06 NOTE — Telephone Encounter (Signed)
Scheduled per 08/12 los, patient has been called and notified.  °

## 2020-02-06 ENCOUNTER — Inpatient Hospital Stay (HOSPITAL_BASED_OUTPATIENT_CLINIC_OR_DEPARTMENT_OTHER): Payer: Medicare Other | Admitting: Hematology

## 2020-02-06 ENCOUNTER — Other Ambulatory Visit: Payer: Self-pay

## 2020-02-06 ENCOUNTER — Inpatient Hospital Stay: Payer: Medicare Other | Attending: Hematology

## 2020-02-06 ENCOUNTER — Inpatient Hospital Stay: Payer: Medicare Other

## 2020-02-06 VITALS — BP 155/75 | HR 98 | Temp 97.5°F | Resp 18 | Ht 62.0 in | Wt 192.8 lb

## 2020-02-06 VITALS — BP 148/66 | HR 88 | Temp 98.7°F | Resp 20

## 2020-02-06 DIAGNOSIS — C8218 Follicular lymphoma grade II, lymph nodes of multiple sites: Secondary | ICD-10-CM | POA: Diagnosis not present

## 2020-02-06 DIAGNOSIS — Z5112 Encounter for antineoplastic immunotherapy: Secondary | ICD-10-CM | POA: Insufficient documentation

## 2020-02-06 DIAGNOSIS — C8228 Follicular lymphoma grade III, unspecified, lymph nodes of multiple sites: Secondary | ICD-10-CM | POA: Diagnosis present

## 2020-02-06 DIAGNOSIS — Z7189 Other specified counseling: Secondary | ICD-10-CM

## 2020-02-06 DIAGNOSIS — Z8379 Family history of other diseases of the digestive system: Secondary | ICD-10-CM | POA: Insufficient documentation

## 2020-02-06 DIAGNOSIS — Z803 Family history of malignant neoplasm of breast: Secondary | ICD-10-CM | POA: Insufficient documentation

## 2020-02-06 DIAGNOSIS — Z801 Family history of malignant neoplasm of trachea, bronchus and lung: Secondary | ICD-10-CM | POA: Diagnosis not present

## 2020-02-06 DIAGNOSIS — I700 Atherosclerosis of aorta: Secondary | ICD-10-CM | POA: Insufficient documentation

## 2020-02-06 DIAGNOSIS — Z79899 Other long term (current) drug therapy: Secondary | ICD-10-CM | POA: Diagnosis not present

## 2020-02-06 LAB — CBC WITH DIFFERENTIAL/PLATELET
Abs Immature Granulocytes: 0.04 10*3/uL (ref 0.00–0.07)
Basophils Absolute: 0.1 10*3/uL (ref 0.0–0.1)
Basophils Relative: 1 %
Eosinophils Absolute: 0.1 10*3/uL (ref 0.0–0.5)
Eosinophils Relative: 3 %
HCT: 36.1 % (ref 36.0–46.0)
Hemoglobin: 12.9 g/dL (ref 12.0–15.0)
Immature Granulocytes: 1 %
Lymphocytes Relative: 8 %
Lymphs Abs: 0.4 10*3/uL — ABNORMAL LOW (ref 0.7–4.0)
MCH: 31.2 pg (ref 26.0–34.0)
MCHC: 35.7 g/dL (ref 30.0–36.0)
MCV: 87.2 fL (ref 80.0–100.0)
Monocytes Absolute: 0.8 10*3/uL (ref 0.1–1.0)
Monocytes Relative: 16 %
Neutro Abs: 3.6 10*3/uL (ref 1.7–7.7)
Neutrophils Relative %: 71 %
Platelets: 192 10*3/uL (ref 150–400)
RBC: 4.14 MIL/uL (ref 3.87–5.11)
RDW: 12.1 % (ref 11.5–15.5)
WBC: 5.1 10*3/uL (ref 4.0–10.5)
nRBC: 0 % (ref 0.0–0.2)

## 2020-02-06 LAB — CMP (CANCER CENTER ONLY)
ALT: 32 U/L (ref 0–44)
AST: 19 U/L (ref 15–41)
Albumin: 4.2 g/dL (ref 3.5–5.0)
Alkaline Phosphatase: 63 U/L (ref 38–126)
Anion gap: 11 (ref 5–15)
BUN: 13 mg/dL (ref 8–23)
CO2: 27 mmol/L (ref 22–32)
Calcium: 9.8 mg/dL (ref 8.9–10.3)
Chloride: 101 mmol/L (ref 98–111)
Creatinine: 0.91 mg/dL (ref 0.44–1.00)
GFR, Estimated: >60 mL/min (ref >60)
Glucose, Bld: 203 mg/dL — ABNORMAL HIGH (ref 70–99)
Potassium: 3.2 mmol/L — ABNORMAL LOW (ref 3.5–5.1)
Sodium: 139 mmol/L (ref 135–145)
Total Bilirubin: 0.4 mg/dL (ref 0.3–1.2)
Total Protein: 7 g/dL (ref 6.5–8.1)

## 2020-02-06 LAB — LACTATE DEHYDROGENASE: LDH: 223 U/L — ABNORMAL HIGH (ref 98–192)

## 2020-02-06 MED ORDER — FAMOTIDINE IN NACL 20-0.9 MG/50ML-% IV SOLN
20.0000 mg | Freq: Once | INTRAVENOUS | Status: AC
  Administered 2020-02-06: 20 mg via INTRAVENOUS

## 2020-02-06 MED ORDER — ACETAMINOPHEN 325 MG PO TABS
ORAL_TABLET | ORAL | Status: AC
  Filled 2020-02-06: qty 2

## 2020-02-06 MED ORDER — SODIUM CHLORIDE 0.9 % IV SOLN
375.0000 mg/m2 | Freq: Once | INTRAVENOUS | Status: AC
  Administered 2020-02-06: 700 mg via INTRAVENOUS
  Filled 2020-02-06: qty 50

## 2020-02-06 MED ORDER — FAMOTIDINE IN NACL 20-0.9 MG/50ML-% IV SOLN
INTRAVENOUS | Status: AC
  Filled 2020-02-06: qty 50

## 2020-02-06 MED ORDER — SODIUM CHLORIDE 0.9 % IV SOLN
Freq: Once | INTRAVENOUS | Status: AC
  Filled 2020-02-06: qty 250

## 2020-02-06 MED ORDER — DIPHENHYDRAMINE HCL 25 MG PO CAPS
ORAL_CAPSULE | ORAL | Status: AC
  Filled 2020-02-06: qty 2

## 2020-02-06 MED ORDER — ACETAMINOPHEN 325 MG PO TABS
650.0000 mg | ORAL_TABLET | Freq: Once | ORAL | Status: AC
  Administered 2020-02-06: 650 mg via ORAL

## 2020-02-06 MED ORDER — DIPHENHYDRAMINE HCL 25 MG PO CAPS
50.0000 mg | ORAL_CAPSULE | Freq: Once | ORAL | Status: AC
  Administered 2020-02-06: 50 mg via ORAL

## 2020-02-06 MED ORDER — SODIUM CHLORIDE 0.9 % IV SOLN
10.0000 mg | Freq: Once | INTRAVENOUS | Status: AC
  Administered 2020-02-06: 10 mg via INTRAVENOUS
  Filled 2020-02-06: qty 10

## 2020-02-06 NOTE — Progress Notes (Signed)
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 female who has been referred to [place] by Dr Layne Benton for evaluation and management of possible lymphoproliferative disease.The pt reports that she is doing well overall.  The pt reports that she had an MRI earlier this month because of her back pain. She has had back pain for about 30 years, but in May, it got so bad that she could not move. The pain radiated around her hip and down to her knee. She could not even get in her car to go to the ER. She put herself on bedrest for 5 weeks which improved her symptoms. Now, her back pain is back to baseline.  Pt reported that, in the past 6 months, she has been moving her bowels 3-4x daily, which is much more than normal for her. Throughout the day, her stools get progressively looser. She went to the ER on 10/11/2018 for food poisoning from mayonnaise because she had blood in the stool and cramping. The symptoms began 2 hours after she ate the mayo. She was not placed on antibiotics or any other medications. No stool testing was done. Her symptoms resolved in 2 days.  She experienced night sweats up until 2006, and they returned 6 months ago. She does not take hormone replacements because of her FHx of breast cancer. She also reports some new fatigue, dry eyes, and headaches over the last 6 months. Denies unexpected weight loss. She has not been diagnosed with diabetes.  Of note prior to the patient's visit today, pt has had MRI lumbar spine wo contrast completed on 11/02/2018 with results revealing "Extensive retroperitoneal lymphadenopathy highly worrisome for neoplastic process such as lymphoproliferative disease. 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 There is mild central canal narrowing overall at this Level."  Most recent lab results (10/11/2018) of CBC is as follows: all values are WNL except for Potassium at 3.3, glucose bld at 212. 10/11/2018 Urinalysis revealed hazy appearance 10/11/2018 Lipase at 29  On review of systems, pt reports back pain, 3-4 bowel movements daily, dry eyes, headaches, night sweats, and denies unexpected weight loss, mouth sores, belly pain, changes in urination trouble swallowing, fever, chills, and any other symptoms.   On PMHx the pt reports total hysterectomy at 38 years ago due to ovarian cysts, lymphoreticulosis, sleep apnea related surgery, breast augmentation with no removal of breast tissue On Social Hx the pt reports never smoking On Family Hx the pt reports breast cancer on both sides of the family.   INTERVAL HISTORY Meghan Alexander is a 64 y.o. female presenting today for the management and evaluation of her follicular lymphoma and maintenance Rituxan. The patient's last visit with [place] was on 12/05/2019. The pt reports that she is doing well overall.  The pt reports that she recently received her COVID19 booster, which she tolerated well. She also received her annual flu vaccine. Pt denies any recent infections. She did have a bothersome cough after her Lisinopril was increased, but this has resolved.   Lab results today (02/06/20) of CBC w/diff and CMP is as follows: all  values are WNL except for Lymphs Abs at 0.4K, Potassium at 3.2, Glucose at 203. 02/06/2020 LDH at 223  On review of systems, pt reports improved lightheadedness/dizziness and denies fevers, chills, night sweats, cough, new fatigue, new back pain, abdominal pain, rash and any other symptoms.   MEDICAL HISTORY:  Past Medical History:  Diagnosis Date  . Allergy     year around allergies  . Arthritis    fingers, knees, neck, back-osteoarthristis  . Bronchitis    hx of  . Complication of anesthesia   . 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 GERD (gastroesophageal reflux disease)    hx of reflux-went away with elevating HOB   . Hypertension   . Pneumonia    hx of   . PONV (postoperative nausea and vomiting)     SURGICAL HISTORY: Past Surgical History:  Procedure Laterality Date  . ABDOMINAL HYSTERECTOMY    . ADENOIDECTOMY    . cosmetic surgeries    . ESOPHAGEAL MANOMETRY N/A 12/02/2015   Procedure: ESOPHAGEAL MANOMETRY (EM);  Surgeon: [name], MD;  Location: [facility];  Service: Endoscopy;  Laterality: N/A;  . ESOPHAGOGASTRODUODENOSCOPY N/A 12/02/2015   Procedure: ESOPHAGOGASTRODUODENOSCOPY (EGD);  Surgeon: [name], MD;  Location: [facility];  Service: Endoscopy;  Laterality: N/A;  . lymph node removal left leg     cat scratch surgery   . THROAT SURGERY     sleep apnea surgery   . TONSILLECTOMY      SOCIAL HISTORY: Social History   Socioeconomic History  . Marital status: Married    Spouse name: Not on file  . Number of children: Not on file  . Years of education: Not on file  . Highest education level: Not on file  Occupational History  . Not on file  Tobacco Use  . Smoking status: Never Smoker  . Smokeless tobacco: Never Used  Vaping Use  . Vaping Use: Never used  Substance and Sexual Activity  . Alcohol use: No    Alcohol/week: 0.0 standard drinks  . Drug use: No  . Sexual activity: Not on file  Other Topics Concern  . 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 Difficulty of Paying Living Expenses: Not on  file  Food Insecurity:   . Worried About Charity fundraiser in the Last Year: Not on file  . Ran Out of Food in the Last Year: Not on file  Transportation Needs:   . Lack of Transportation (Medical): Not on file  . Lack of Transportation (Non-Medical): Not on file  Physical Activity:   . Days of Exercise per Week: Not on file  . Minutes of Exercise per Session: Not on file  Stress:   . Feeling of Stress : Not on file  Social Connections:   . Frequency of Communication with Friends and Family: Not on file  . Frequency of Social Gatherings with Friends and Family: Not on file  . Attends Religious Services: Not on file  . Active Member of Clubs or Organizations: Not on file  . Attends Archivist Meetings: Not on file  . Marital Status: Not on file  Intimate Partner  Violence:   . Fear of Current or Ex-Partner: Not on file  . Emotionally Abused: Not on file  . Physically Abused: Not on file  . Sexually Abused: Not on file    FAMILY HISTORY: Family History  Problem Relation Age of Onset  . Lung cancer Father        1982 died from it  . Hernia Mother   . Breast cancer Maternal Aunt        multiple   . Breast cancer Maternal Grandmother   . Breast cancer Paternal Grandmother   . Breast cancer Cousin        cousins multiple   . Colon cancer Neg Hx   . Colon polyps Neg Hx   . Rectal cancer Neg Hx   . Stomach cancer Neg Hx     ALLERGIES:  is allergic to latex, codeine, other, hydrocodone, and oxycodone.  MEDICATIONS:  Current Outpatient Medications  Medication Sig Dispense Refill  . acyclovir (ZOVIRAX) 400 MG tablet Take 1 tablet (400 mg total) by mouth daily. 30 tablet 6  . aspirin EC 81 MG tablet Take 81 mg by mouth daily.    Marland Kitchen dexamethasone (DECADRON) 4 MG tablet TAKE 2 TABLETS BY MOUTH DAILY FOR 3 DAYS, STARTING THE DAY AFTER COMPLETEING EACH CHEMOTHERAPY 30 tablet 0  . diphenhydrAMINE (BENADRYL) 25 MG tablet Take 25 mg by mouth at bedtime.    .  Lactobacillus-Inulin (CULTURELLE DIGESTIVE HEALTH PO) Take 1 capsule by mouth at bedtime.    Marland Kitchen lisinopril-hydrochlorothiazide (ZESTORETIC) 20-12.5 MG tablet Take 1 tablet by mouth daily.    Marland Kitchen LORazepam (ATIVAN) 0.5 MG tablet TAKE 1 TABLET(0.5 MG) BY MOUTH EVERY 8 HOURS 30 tablet 0  . Magnesium Hydroxide (DULCOLAX PO) Take 1 tablet by mouth 2 (two) times daily.    . Melatonin 10 MG CAPS Take 10 mg by mouth at bedtime.     . meloxicam (MOBIC) 15 MG tablet Take 15 mg by mouth daily.    . metFORMIN (GLUCOPHAGE) 500 MG tablet Take 500 mg by mouth daily.    . metoprolol succinate (TOPROL-XL) 50 MG 24 hr tablet Take 50 mg by mouth daily.    . Multiple Vitamin (MULTIVITAMIN) tablet Take 1 tablet by mouth daily.    . ondansetron (ZOFRAN) 8 MG tablet TAKE 1 TABLET BY MOUTH EVERY 8 HOURS AS NEEDED FOR NAUSEA. STARTING 2 DAYS AFTER EACH CHEMOTHERAPY 30 tablet 3  . predniSONE (DELTASONE) 10 MG tablet Take 10 mg by mouth 2 (two) times daily.    . prochlorperazine (COMPAZINE) 10 MG tablet Take 1 tablet (10 mg total) by mouth every 6 (six) hours as needed for nausea or vomiting. 30 tablet 0  . rosuvastatin (CRESTOR) 10 MG tablet Take 10 mg by mouth daily.    Marland Kitchen venlafaxine (EFFEXOR) 37.5 MG tablet Take 37.5 mg by mouth once.     No current facility-administered medications for this visit.   Facility-Administered Medications Ordered in Other Visits  Medication Dose Route Frequency Provider Last Rate Last Admin  . riTUXimab-pvvr (RUXIENCE) 700 mg in sodium chloride 0.9 % 250 mL (2.1875 mg/mL) infusion  375 mg/m2 (Treatment Plan Recorded) Intravenous Once Brunetta Genera, MD        REVIEW OF SYSTEMS: A 10+ POINT REVIEW OF SYSTEMS WAS OBTAINED including neurology, dermatology, psychiatry, cardiac, respiratory, lymph, extremities, GI, GU, Musculoskeletal, constitutional, breasts, reproductive, HEENT.  All pertinent positives are noted in the HPI.  All others are negative.    PHYSICAL EXAMINATION: ECOG FS:1  -  Symptomatic but completely ambulatory  Vitals:   02/06/20 0924  BP: (!) 155/75  Pulse: 98  Resp: 18  Temp: (!) 97.5 F (36.4 C)  SpO2: 97%   Wt Readings from Last 3 Encounters:  02/06/20 192 lb 12.8 oz (87.5 kg)  12/05/19 192 lb 14.4 oz (87.5 kg)  10/03/19 195 lb 9.6 oz (88.7 kg)   Body mass index is 35.26 kg/m.   GENERAL:alert, in no acute distress and comfortable SKIN: no acute rashes, no significant lesions EYES: conjunctiva are pink and non-injected, sclera anicteric OROPHARYNX: MMM, no exudates, no oropharyngeal erythema or ulceration NECK: supple, no JVD LYMPH:  no palpable lymphadenopathy in the cervical, axillary or inguinal regions LUNGS: clear to auscultation b/l with normal respiratory effort HEART: regular rate & rhythm ABDOMEN:  normoactive bowel sounds , non tender, not distended. No palpable hepatosplenomegaly.  Extremity: no pedal edema PSYCH: alert & oriented x 3 with fluent speech NEURO: no focal motor/sensory deficits  LABORATORY DATA:  I have reviewed the data as listed  . CBC Latest Ref Rng & Units 02/06/2020 12/05/2019 10/03/2019  WBC 4.0 - 10.5 K/uL 5.1 3.3(L) 4.3  Hemoglobin 12.0 - 15.0 g/dL 12.9 13.0 13.0  Hematocrit 36 - 46 % 36.1 36.6 36.0  Platelets 150 - 400 K/uL 192 164 186    . CMP Latest Ref Rng & Units 02/06/2020 12/05/2019 10/03/2019  Glucose 70 - 99 mg/dL 203(H) 200(H) 216(H)  BUN 8 - 23 mg/dL 13 17 11   Creatinine 0.44 - 1.00 mg/dL 0.91 0.91 0.84  Sodium 135 - 145 mmol/L 139 138 139  Potassium 3.5 - 5.1 mmol/L 3.2(L) 3.5 3.9  Chloride 98 - 111 mmol/L 101 98 101  CO2 22 - 32 mmol/L 27 26 24   Calcium 8.9 - 10.3 mg/dL 9.8 9.9 9.3  Total Protein 6.5 - 8.1 g/dL 7.0 6.9 6.6  Total Bilirubin 0.3 - 1.2 mg/dL 0.4 0.5 0.4  Alkaline Phos 38 - 126 U/L 63 65 61  AST 15 - 41 U/L 19 23 25   ALT 0 - 44 U/L 32 34 32   . Lab Results  Component Value Date   LDH 223 (H) 02/06/2020   05/14/2019 CT ANGIO CHEST PE W OR WO CONTRAST (Accession  [id number]):   03/08/2019 Lymph Node Biopsy ([id number]):      RADIOGRAPHIC STUDIES: I have personally reviewed the radiological images as listed and agreed with the findings in the report. No results found.  ASSESSMENT & PLAN:   #1 Stage III/IV follicular lymphoma - likely low grade but accurate grading difficult  11/02/2018 MRI lumbar spine wo contrast revealed "Extensive retroperitoneal lymphadenopathy highly worrisome for neoplastic process such as lymphoproliferative disease. Spondylosis worst at L4-5 where there is narrowing in the right subarticular recess predominantly due to a synovial cyst off the medial margin of the right facet with encroachment on the descending right L5 root. There is mild central canal narrowing overall at this Level."  12/04/2018 CT CAP revealed "Mild-to-moderate abdominal and pelvic lymphadenopathy and new 9 mm left superior mediastinal lymph node, highly suspicious for lymphoproliferative disorder. Stable 8 mm lingular pulmonary nodule, consistent with benign Etiology. Moderate hepatic steatosis. 1.3 cm indeterminate low-attenuation lesion in the right lobe. Recommend continued attention on follow-up CT."  12/18/2018 lymph node biopsy pathology revealing a follicular lymphoma- likely low grade but accurate grading not possible on limited sampling.  02/06/2019 PET/CT scan ([id number]) revealed "1. Bulky hypermetabolic lymphadenopathy in the abdomen and upper pelvis, as described. This is associated with mild hypermetabolic  lymphadenopathy in the upper mediastinum and right axilla. Hypermetabolic lymphadenopathy represents a combination of Deauville 4 and Deauville 5 disease. 2. 8 mm nodule in the lingula without hypermetabolism. Attention on follow-up recommended. 3. Hepatic steatosis. 4.  Aortic Atherosclerois (ICD10-170.0)."  03/08/2019 Lymph Node Biopsy ([id number]) revealed "LYMPH NODE, NEEDLE CORE BIOPSY: - Follicular lymphoma (grade 1-2 of 3)  with elevated proliferation rate."  2. Mild treatment related thrombocytopenia- resolved.  PLAN: -Discussed pt labwork today, 02/06/20; mild lymphopenia, other blood counts are nml, LDH is borderline elevated - but within her nml variation. -Advised pt that lymphopenia is expected with Rituxan - not concerning at this time.  -No lab or clinical evidence of FL progression/recurrence at this time.  -The pt has no prohibitive toxicities from continuing maintenance Rituxan every 2 months.  -Advised pt that we would repeat scans after 1-2 years of maintenance treatment in the absence of new symptoms or worsening cytopenias.  -Will see back in 2 months with labs   FOLLOW UP: Plz schedule next 2 cycles of maintenance Rituxan every 2 months with labs and MD visits  The total time spent in the appt was 20 minutes and more than 50% was on counseling and direct patient cares.  All of the patient's questions were answered with apparent satisfaction. The patient knows to call the clinic with any problems, questions or concerns.   Sullivan Lone MD [place] AAHIVMS [facility] Hematology/Oncology Physician [facility]  (Office):       [phone] (Work cell):  [phone] (Fax):           [phone]  02/06/2020 11:34 AM  I, Yevette Edwards, am acting as a scribe for Dr. Sullivan Lone.   .I have reviewed the above documentation for accuracy and completeness, and I agree with the above. Brunetta Genera MD

## 2020-02-06 NOTE — Patient Instructions (Signed)
[facility] Discharge Instructions for Patients Receiving Chemotherapy  Today you received the following monoclonal antibody agents Rituximab-pvvr (RUXIENCE).  To help prevent nausea and vomiting after your treatment, we encourage you to take your nausea medication as prescribed.   If you develop nausea and vomiting that is not controlled by your nausea medication, call the clinic.   BELOW ARE SYMPTOMS THAT SHOULD BE REPORTED IMMEDIATELY:  *FEVER GREATER THAN 100.5 F  *CHILLS WITH OR WITHOUT FEVER  NAUSEA AND VOMITING THAT IS NOT CONTROLLED WITH YOUR NAUSEA MEDICATION  *UNUSUAL SHORTNESS OF BREATH  *UNUSUAL BRUISING OR BLEEDING  TENDERNESS IN MOUTH AND THROAT WITH OR WITHOUT PRESENCE OF ULCERS  *URINARY PROBLEMS  *BOWEL PROBLEMS  UNUSUAL RASH Items with * indicate a potential emergency and should be followed up as soon as possible.  Feel free to call the clinic should you have any questions or concerns. The clinic phone number is [phone].  Please show the [place] at check-in to the Emergency Department and triage nurse.

## 2020-02-18 ENCOUNTER — Other Ambulatory Visit: Payer: Self-pay | Admitting: Hematology

## 2020-04-09 ENCOUNTER — Other Ambulatory Visit: Payer: Self-pay

## 2020-04-09 ENCOUNTER — Inpatient Hospital Stay: Payer: Medicare Other | Attending: Hematology

## 2020-04-09 ENCOUNTER — Inpatient Hospital Stay: Payer: Medicare Other

## 2020-04-09 ENCOUNTER — Inpatient Hospital Stay (HOSPITAL_BASED_OUTPATIENT_CLINIC_OR_DEPARTMENT_OTHER): Payer: Medicare Other | Admitting: Hematology

## 2020-04-09 VITALS — BP 131/76 | HR 86 | Resp 16

## 2020-04-09 VITALS — BP 138/85 | HR 89 | Temp 97.8°F | Resp 18 | Ht 62.0 in | Wt 194.9 lb

## 2020-04-09 DIAGNOSIS — Z5112 Encounter for antineoplastic immunotherapy: Secondary | ICD-10-CM | POA: Insufficient documentation

## 2020-04-09 DIAGNOSIS — Z8269 Family history of other diseases of the musculoskeletal system and connective tissue: Secondary | ICD-10-CM | POA: Diagnosis not present

## 2020-04-09 DIAGNOSIS — Z801 Family history of malignant neoplasm of trachea, bronchus and lung: Secondary | ICD-10-CM | POA: Diagnosis not present

## 2020-04-09 DIAGNOSIS — Z803 Family history of malignant neoplasm of breast: Secondary | ICD-10-CM | POA: Diagnosis not present

## 2020-04-09 DIAGNOSIS — C8218 Follicular lymphoma grade II, lymph nodes of multiple sites: Secondary | ICD-10-CM

## 2020-04-09 DIAGNOSIS — M549 Dorsalgia, unspecified: Secondary | ICD-10-CM | POA: Insufficient documentation

## 2020-04-09 DIAGNOSIS — C8228 Follicular lymphoma grade III, unspecified, lymph nodes of multiple sites: Secondary | ICD-10-CM | POA: Insufficient documentation

## 2020-04-09 DIAGNOSIS — Z79899 Other long term (current) drug therapy: Secondary | ICD-10-CM | POA: Insufficient documentation

## 2020-04-09 DIAGNOSIS — Z7189 Other specified counseling: Secondary | ICD-10-CM

## 2020-04-09 LAB — CBC WITH DIFFERENTIAL/PLATELET
Abs Immature Granulocytes: 0.03 10*3/uL (ref 0.00–0.07)
Basophils Absolute: 0 10*3/uL (ref 0.0–0.1)
Basophils Relative: 1 %
Eosinophils Absolute: 0.2 10*3/uL (ref 0.0–0.5)
Eosinophils Relative: 4 %
HCT: 38.7 % (ref 36.0–46.0)
Hemoglobin: 13.7 g/dL (ref 12.0–15.0)
Immature Granulocytes: 1 %
Lymphocytes Relative: 12 %
Lymphs Abs: 0.5 10*3/uL — ABNORMAL LOW (ref 0.7–4.0)
MCH: 31.4 pg (ref 26.0–34.0)
MCHC: 35.4 g/dL (ref 30.0–36.0)
MCV: 88.6 fL (ref 80.0–100.0)
Monocytes Absolute: 0.6 10*3/uL (ref 0.1–1.0)
Monocytes Relative: 13 %
Neutro Abs: 3.1 10*3/uL (ref 1.7–7.7)
Neutrophils Relative %: 69 %
Platelets: 190 10*3/uL (ref 150–400)
RBC: 4.37 MIL/uL (ref 3.87–5.11)
RDW: 11.8 % (ref 11.5–15.5)
WBC: 4.4 10*3/uL (ref 4.0–10.5)
nRBC: 0 % (ref 0.0–0.2)

## 2020-04-09 LAB — CMP (CANCER CENTER ONLY)
ALT: 39 U/L (ref 0–44)
AST: 27 U/L (ref 15–41)
Albumin: 4.3 g/dL (ref 3.5–5.0)
Alkaline Phosphatase: 63 U/L (ref 38–126)
Anion gap: 13 (ref 5–15)
BUN: 16 mg/dL (ref 8–23)
CO2: 24 mmol/L (ref 22–32)
Calcium: 9.7 mg/dL (ref 8.9–10.3)
Chloride: 102 mmol/L (ref 98–111)
Creatinine: 1.08 mg/dL — ABNORMAL HIGH (ref 0.44–1.00)
GFR, Estimated: 57 mL/min — ABNORMAL LOW (ref >60)
Glucose, Bld: 203 mg/dL — ABNORMAL HIGH (ref 70–99)
Potassium: 3.8 mmol/L (ref 3.5–5.1)
Sodium: 139 mmol/L (ref 135–145)
Total Bilirubin: 0.4 mg/dL (ref 0.3–1.2)
Total Protein: 7 g/dL (ref 6.5–8.1)

## 2020-04-09 LAB — LACTATE DEHYDROGENASE: LDH: 223 U/L — ABNORMAL HIGH (ref 98–192)

## 2020-04-09 MED ORDER — SODIUM CHLORIDE 0.9 % IV SOLN
375.0000 mg/m2 | Freq: Once | INTRAVENOUS | Status: AC
  Administered 2020-04-09: 13:00:00 700 mg via INTRAVENOUS
  Filled 2020-04-09: qty 50

## 2020-04-09 MED ORDER — FAMOTIDINE IN NACL 20-0.9 MG/50ML-% IV SOLN
INTRAVENOUS | Status: AC
  Filled 2020-04-09: qty 50

## 2020-04-09 MED ORDER — FAMOTIDINE IN NACL 20-0.9 MG/50ML-% IV SOLN
20.0000 mg | Freq: Once | INTRAVENOUS | Status: AC
  Administered 2020-04-09: 12:00:00 20 mg via INTRAVENOUS

## 2020-04-09 MED ORDER — SODIUM CHLORIDE 0.9 % IV SOLN
Freq: Once | INTRAVENOUS | Status: AC
  Filled 2020-04-09: qty 250

## 2020-04-09 MED ORDER — DIPHENHYDRAMINE HCL 25 MG PO CAPS
ORAL_CAPSULE | ORAL | Status: AC
  Filled 2020-04-09: qty 2

## 2020-04-09 MED ORDER — ACETAMINOPHEN 325 MG PO TABS
ORAL_TABLET | ORAL | Status: AC
  Filled 2020-04-09: qty 2

## 2020-04-09 MED ORDER — SODIUM CHLORIDE 0.9 % IV SOLN
10.0000 mg | Freq: Once | INTRAVENOUS | Status: AC
  Administered 2020-04-09: 12:00:00 10 mg via INTRAVENOUS
  Filled 2020-04-09: qty 10

## 2020-04-09 MED ORDER — ACETAMINOPHEN 325 MG PO TABS
650.0000 mg | ORAL_TABLET | Freq: Once | ORAL | Status: AC
  Administered 2020-04-09: 12:00:00 650 mg via ORAL

## 2020-04-09 MED ORDER — DIPHENHYDRAMINE HCL 25 MG PO CAPS
50.0000 mg | ORAL_CAPSULE | Freq: Once | ORAL | Status: AC
  Administered 2020-04-09: 12:00:00 50 mg via ORAL

## 2020-04-09 MED ORDER — FULVESTRANT 250 MG/5ML IM SOLN
INTRAMUSCULAR | Status: AC
  Filled 2020-04-09: qty 10

## 2020-04-09 NOTE — Progress Notes (Signed)
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 female who has been referred to [place] by Dr Layne Benton for evaluation and management of possible lymphoproliferative disease.The pt reports that she is doing well overall.  The pt reports that she had an MRI earlier this month because of her back pain. She has had back pain for about 30 years, but in May, it got so bad that she could not move. The pain radiated around her hip and down to her knee. She could not even get in her car to go to the ER. She put herself on bedrest for 5 weeks which improved her symptoms. Now, her back pain is back to baseline.  Pt reported that, in the past 6 months, she has been moving her bowels 3-4x daily, which is much more than normal for her. Throughout the day, her stools get progressively looser. She went to the ER on 10/11/2018 for food poisoning from mayonnaise because she had blood in the stool and cramping. The symptoms began 2 hours after she ate the mayo. She was not placed on antibiotics or any other medications. No stool testing was done. Her symptoms resolved in 2 days.  She experienced night sweats up until 2006, and they returned 6 months ago. She does not take hormone replacements because of her FHx of breast cancer. She also reports some new fatigue, dry eyes, and headaches over the last 6 months. Denies unexpected weight loss. She has not been diagnosed with diabetes.  Of note prior to the patient's visit today, pt has had MRI lumbar spine wo contrast completed on 11/02/2018 with results revealing "Extensive retroperitoneal lymphadenopathy highly worrisome for neoplastic process such as lymphoproliferative disease. 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 There is mild central canal narrowing overall at this Level."  Most recent lab results (10/11/2018) of CBC is as follows: all values are WNL except for Potassium at 3.3, glucose bld at 212. 10/11/2018 Urinalysis revealed hazy appearance 10/11/2018 Lipase at 29  On review of systems, pt reports back pain, 3-4 bowel movements daily, dry eyes, headaches, night sweats, and denies unexpected weight loss, mouth sores, belly pain, changes in urination trouble swallowing, fever, chills, and any other symptoms.   On PMHx the pt reports total hysterectomy at 38 years ago due to ovarian cysts, lymphoreticulosis, sleep apnea related surgery, breast augmentation with no removal of breast tissue On Social Hx the pt reports never smoking On Family Hx the pt reports breast cancer on both sides of the family.   INTERVAL HISTORY  Meghan Alexander is a 64 y.o. female presenting today for the management and evaluation of her follicular lymphoma and maintenance Rituxan. The patient's last visit with [place] was on 02/06/2020. The pt reports that she is doing well overall.  The pt reports that she has had some recent nausea, which she thinks is stress related as she just lost her young grandson. Pt denies any other new symptoms. She continues to deal with limiting, chronic knee pain. Pt is interested in getting a knee replacement. She has received her COVID19 booster and flu vaccine.  Lab results today (04/09/20) of CBC w/diff and CMP is as follows: all values are WNL except for Lymphs Abs at 0.5K, Glucose at 203, Creatinine at 1.08, GFR Est at 57. 04/09/2020 LDH at 223  On review of systems, pt reports knee pain, nausea and denies fatigue, fevers, chills, abdominal pain, joint swelling and any other symptoms.   MEDICAL HISTORY:  Past Medical History:   Diagnosis Date  . Allergy    year around allergies  . Arthritis    fingers, knees, neck, back-osteoarthristis  . Bronchitis    hx of  . Complication of anesthesia   . 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 GERD (gastroesophageal reflux disease)    hx of reflux-went away with elevating HOB   . Hypertension   . Pneumonia    hx of   . PONV (postoperative nausea and vomiting)     SURGICAL HISTORY: Past Surgical History:  Procedure Laterality Date  . ABDOMINAL HYSTERECTOMY    . ADENOIDECTOMY    . cosmetic surgeries    . ESOPHAGEAL MANOMETRY N/A 12/02/2015   Procedure: ESOPHAGEAL MANOMETRY (EM);  Surgeon: [name], MD;  Location: [facility];  Service: Endoscopy;  Laterality: N/A;  . ESOPHAGOGASTRODUODENOSCOPY N/A 12/02/2015   Procedure: ESOPHAGOGASTRODUODENOSCOPY (EGD);  Surgeon: [name], MD;  Location: [facility];  Service: Endoscopy;  Laterality: N/A;  . lymph node removal left leg     cat scratch surgery   . THROAT SURGERY     sleep apnea surgery   . TONSILLECTOMY      SOCIAL HISTORY: Social History   Socioeconomic History  . Marital status: Married    Spouse name: Not on file  . Number of children: Not on file  . Years of education: Not on file  . Highest education level: Not on file  Occupational History  . Not on file  Tobacco Use  . Smoking status: Never Smoker  . Smokeless tobacco: Never Used  Vaping Use  . Vaping Use: Never used  Substance and Sexual Activity  . Alcohol use: No    Alcohol/week: 0.0 standard drinks  . Drug use: No  . Sexual activity: Not on file  Other Topics Concern  . 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 Lung cancer Father        1982 died from it  . Hernia Mother   . Breast cancer Maternal Aunt        multiple   . Breast cancer Maternal Grandmother   . Breast cancer Paternal Grandmother   . Breast cancer Cousin        cousins multiple   . Colon cancer Neg Hx   . Colon polyps Neg Hx   . Rectal cancer Neg Hx   . Stomach cancer Neg Hx     ALLERGIES:  is allergic to latex, codeine, other, hydrocodone,  and oxycodone.  MEDICATIONS:  Current Outpatient Medications  Medication Sig Dispense Refill  . acyclovir (ZOVIRAX) 400 MG tablet TAKE 1 TABLET(400 MG) BY MOUTH DAILY 30 tablet 6  . aspirin EC 81 MG tablet Take 81 mg by mouth daily.    Marland Kitchen dexamethasone (DECADRON) 4 MG tablet TAKE 2 TABLETS BY MOUTH DAILY FOR 3 DAYS, STARTING THE DAY AFTER COMPLETEING EACH CHEMOTHERAPY 30 tablet 0  . diphenhydrAMINE (BENADRYL) 25 MG tablet Take 25 mg by mouth at bedtime.    . Lactobacillus-Inulin (CULTURELLE DIGESTIVE HEALTH PO) Take 1 capsule by mouth at bedtime.    Marland Kitchen lisinopril-hydrochlorothiazide (ZESTORETIC) 20-12.5 MG tablet Take 1 tablet by mouth daily.    Marland Kitchen LORazepam (ATIVAN) 0.5 MG tablet TAKE 1 TABLET(0.5 MG) BY MOUTH EVERY 8 HOURS 30 tablet 0  . Magnesium Hydroxide (DULCOLAX PO) Take 1 tablet by mouth 2 (two) times daily.    . Melatonin 10 MG CAPS Take 10 mg by mouth at bedtime.     . meloxicam (MOBIC) 15 MG tablet Take 15 mg by mouth daily.    . metFORMIN (GLUCOPHAGE) 500 MG tablet Take 500 mg by mouth daily.    . metoprolol succinate (TOPROL-XL) 50 MG 24 hr tablet Take 50 mg by mouth daily.    . Multiple Vitamin (MULTIVITAMIN) tablet Take 1 tablet by mouth daily.    . ondansetron (ZOFRAN) 8 MG tablet TAKE 1 TABLET BY MOUTH EVERY 8 HOURS  AS NEEDED FOR NAUSEA. STARTING 2 DAYS AFTER EACH CHEMOTHERAPY 30 tablet 3  . predniSONE (DELTASONE) 10 MG tablet Take 10 mg by mouth 2 (two) times daily.    . prochlorperazine (COMPAZINE) 10 MG tablet Take 1 tablet (10 mg total) by mouth every 6 (six) hours as needed for nausea or vomiting. 30 tablet 0  . rosuvastatin (CRESTOR) 10 MG tablet Take 10 mg by mouth daily.    Marland Kitchen venlafaxine (EFFEXOR) 37.5 MG tablet Take 37.5 mg by mouth once.     No current facility-administered medications for this visit.    REVIEW OF SYSTEMS: A 10+ POINT REVIEW OF SYSTEMS WAS OBTAINED including neurology, dermatology, psychiatry, cardiac, respiratory, lymph, extremities, GI, GU, Musculoskeletal, constitutional, breasts, reproductive, HEENT.  All pertinent positives are noted in the HPI.  All others are negative.   PHYSICAL EXAMINATION: ECOG FS:1 - Symptomatic but completely ambulatory  Vitals:   04/09/20 1050  BP: 138/85  Pulse: 89  Resp: 18  Temp: 97.8 F (36.6 C)  SpO2: 98%   Wt Readings from Last 3 Encounters:  04/09/20 194 lb 14.4 oz (88.4 kg)  02/06/20 192 lb 12.8 oz (87.5 kg)  12/05/19 192 lb 14.4 oz (87.5 kg)   Body mass index is 35.65 kg/m.   GENERAL:alert, in no acute distress and comfortable SKIN: no acute rashes, no significant lesions EYES: conjunctiva are pink and non-injected, sclera anicteric OROPHARYNX: MMM, no exudates, no oropharyngeal erythema or ulceration NECK: supple, no JVD LYMPH:  no palpable lymphadenopathy in the cervical, axillary or inguinal regions LUNGS: clear to auscultation b/l with normal respiratory effort HEART: regular rate & rhythm ABDOMEN:  normoactive bowel sounds , non tender, not distended. No palpable hepatosplenomegaly.  Extremity: no pedal edema PSYCH: alert & oriented x 3 with fluent speech NEURO: no focal motor/sensory deficits  LABORATORY DATA:  I have reviewed the data as listed  . CBC Latest Ref Rng & Units 04/09/2020 02/06/2020 12/05/2019   WBC 4.0 - 10.5 K/uL 4.4 5.1 3.3(L)  Hemoglobin 12.0 - 15.0 g/dL 13.7 12.9 13.0  Hematocrit 36.0 - 46.0 % 38.7 36.1 36.6  Platelets 150 - 400 K/uL 190 192 164    . CMP Latest Ref Rng & Units 04/09/2020 02/06/2020 12/05/2019  Glucose 70 - 99 mg/dL 203(H) 203(H) 200(H)  BUN 8 - 23 mg/dL 16 13 17   Creatinine 0.44 - 1.00 mg/dL 1.08(H) 0.91 0.91  Sodium 135 - 145 mmol/L 139 139 138  Potassium 3.5 - 5.1 mmol/L 3.8 3.2(L) 3.5  Chloride 98 - 111 mmol/L 102 101 98  CO2 22 - 32 mmol/L 24 27 26   Calcium 8.9 - 10.3 mg/dL 9.7 9.8 9.9  Total Protein 6.5 - 8.1 g/dL 7.0 7.0 6.9  Total Bilirubin 0.3 - 1.2 mg/dL 0.4 0.4 0.5  Alkaline Phos 38 - 126 U/L 63 63 65  AST 15 - 41 U/L 27 19 23   ALT 0 - 44 U/L 39 32 34   . Lab Results  Component Value Date   LDH 223 (H) 04/09/2020   05/14/2019 CT ANGIO CHEST PE W OR WO CONTRAST (Accession [id number]):   03/08/2019 Lymph Node Biopsy [id number]):      RADIOGRAPHIC STUDIES: I have personally reviewed the radiological images as listed and agreed with the findings in the report. No results found.  ASSESSMENT & PLAN:   #1 Stage III/IV follicular lymphoma - likely low grade but accurate grading difficult  11/02/2018 MRI lumbar spine wo contrast revealed "Extensive retroperitoneal lymphadenopathy highly worrisome for neoplastic process such as lymphoproliferative disease. Spondylosis worst at L4-5 where there is narrowing in the right subarticular recess predominantly due to a synovial cyst off the medial margin of the right facet with encroachment on the descending right L5 root. There is mild central canal narrowing overall at this Level."  12/04/2018 CT CAP revealed "Mild-to-moderate abdominal and pelvic lymphadenopathy and new 9 mm left superior mediastinal lymph node, highly suspicious for lymphoproliferative disorder. Stable 8 mm lingular pulmonary nodule, consistent with benign Etiology. Moderate hepatic steatosis. 1.3 cm indeterminate  low-attenuation lesion in the right lobe. Recommend continued attention on follow-up CT."  12/18/2018 lymph node biopsy pathology revealing a follicular lymphoma- likely low grade but accurate grading not possible on limited sampling.  02/06/2019 PET/CT scan ([id number]) revealed "1. Bulky hypermetabolic lymphadenopathy in the abdomen and upper pelvis, as described. This is associated with mild hypermetabolic lymphadenopathy in the upper mediastinum and right axilla. Hypermetabolic lymphadenopathy represents a combination of Deauville 4 and Deauville 5 disease. 2. 8 mm nodule in the lingula without hypermetabolism. Attention on follow-up recommended. 3. Hepatic steatosis. 4.  Aortic Atherosclerois (ICD10-170.0)."  03/08/2019 Lymph Node Biopsy ([id number]) revealed "LYMPH NODE, NEEDLE CORE BIOPSY: - Follicular lymphoma (grade 1-2 of 3) with elevated proliferation rate."  2. Mild treatment related thrombocytopenia- resolved.  PLAN: -Discussed pt labwork today, 04/09/20; blood counts are nml, Glucose is high, kidney numbers have bumped up, LDH is stable. -No lab or clinical evidence of Follicular Lymphoma recurrence/progression at this time.  -The pt has no prohibitive toxicities from continuing maintenance Rituxan every two months. -Advised pt that Rituxan is immunosuppressive and can increase the risk of post-operative infections. -Advised pt that there is no visible disease at this time, so we could hold treatment for surgery if neccessary.  -Would recommend surgery occur in 2-3 months and would hold next treatment and delay following Tx to reduce risk of infections. -Recommend pt begin 2000 IU Vitamin D daily - will check levels next visit -Will see back in 2 months with labs   FOLLOW UP: F/u as per scheduled appointments  on 06/11/2019 for next maintenance treatment   The total time spent in the appt was 30 minutes and more than 50% was on counseling and direct patient cares, ordering and  management of chemotherapy  All of the patient's questions were answered with apparent satisfaction. The patient knows to call the clinic with any problems, questions or concerns.   Sullivan Lone MD [place] AAHIVMS [facility] Hematology/Oncology Physician [facility]  (Office):       [phone] (Work cell):  [phone] (Fax):           [phone]  04/09/2020 11:46 AM  I, Yevette Edwards, am acting as a scribe for Dr. Sullivan Lone.   .I have reviewed the above documentation for accuracy and completeness, and I agree with the above. Brunetta Genera MD

## 2020-04-09 NOTE — Patient Instructions (Signed)
[facility] Discharge Instructions for Patients Receiving Chemotherapy  Today you received the following chemotherapy agents:  Rituxan   To help prevent nausea and vomiting after your treatment, we encourage you to take your nausea medication as prescribed.   If you develop nausea and vomiting that is not controlled by your nausea medication, call the clinic.   BELOW ARE SYMPTOMS THAT SHOULD BE REPORTED IMMEDIATELY:  *FEVER GREATER THAN 100.5 F  *CHILLS WITH OR WITHOUT FEVER  NAUSEA AND VOMITING THAT IS NOT CONTROLLED WITH YOUR NAUSEA MEDICATION  *UNUSUAL SHORTNESS OF BREATH  *UNUSUAL BRUISING OR BLEEDING  TENDERNESS IN MOUTH AND THROAT WITH OR WITHOUT PRESENCE OF ULCERS  *URINARY PROBLEMS  *BOWEL PROBLEMS  UNUSUAL RASH Items with * indicate a potential emergency and should be followed up as soon as possible.  Feel free to call the clinic should you have any questions or concerns. The clinic phone number is [phone].  Please show the CHEMO ALERT CARD at check-in to the Emergency Department and triage nurse.   

## 2020-04-22 ENCOUNTER — Telehealth: Payer: Self-pay | Admitting: *Deleted

## 2020-04-22 NOTE — Telephone Encounter (Signed)
Faxed surgical clearance  - fax confirmation received  °

## 2020-06-09 NOTE — Progress Notes (Signed)
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 female who has been referred to [place] by Dr Layne Benton for evaluation and management of possible lymphoproliferative disease.The pt reports that she is doing well overall.  The pt reports that she had an MRI earlier this month because of her back pain. She has had back pain for about 30 years, but in May, it got so bad that she could not move. The pain radiated around her hip and down to her knee. She could not even get in her car to go to the ER. She put herself on bedrest for 5 weeks which improved her symptoms. Now, her back pain is back to baseline.  Pt reported that, in the past 6 months, she has been moving her bowels 3-4x daily, which is much more than normal for her. Throughout the day, her stools get progressively looser. She went to the ER on 10/11/2018 for food poisoning from mayonnaise because she had blood in the stool and cramping. The symptoms began 2 hours after she ate the mayo. She was not placed on antibiotics or any other medications. No stool testing was done. Her symptoms resolved in 2 days.  She experienced night sweats up until 2006, and they returned 6 months ago. She does not take hormone replacements because of her FHx of breast cancer. She also reports some new fatigue, dry eyes, and headaches over the last 6 months. Denies unexpected weight loss. She has not been diagnosed with diabetes.  Of note prior to the patient's visit today, pt has had MRI lumbar spine wo contrast completed on 11/02/2018 with results revealing "Extensive retroperitoneal lymphadenopathy highly worrisome for neoplastic process such as lymphoproliferative disease. 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 There is mild central canal narrowing overall at this Level."  Most recent lab results (10/11/2018) of CBC is as follows: all values are WNL except for Potassium at 3.3, glucose bld at 212. 10/11/2018 Urinalysis revealed hazy appearance 10/11/2018 Lipase at 29  On review of systems, pt reports back pain, 3-4 bowel movements daily, dry eyes, headaches, night sweats, and denies unexpected weight loss, mouth sores, belly pain, changes in urination trouble swallowing, fever, chills, and any other symptoms.   On PMHx the pt reports total hysterectomy at 65 years ago due to ovarian cysts, lymphoreticulosis, sleep apnea related surgery, breast augmentation with no removal of breast tissue On Social Hx the pt reports never smoking On Family Hx the pt reports breast cancer on both sides of the family.   INTERVAL HISTORY  Meghan Alexander is a 65 y.o. female presenting today for the management and evaluation of her follicular lymphoma and maintenance Rituxan. The patient's last visit with [place] was on 04/09/2020. The pt reports that she is doing well overall. She is here for C5 maintenance Rituxan.  The pt reports no new symptoms or concerns. She still has the chronic cough but is following up w this w her PCP. The pt has decided to wait until the treatments and pandemic is over to re-consider her knee surgery. She continues to exercise on her exercise  bike. The pt notes that they sometimes just give out and she almost falls. The pt has been using a soft brace when at home and a walker, noting these both help. The pt has been taking more Vitamin D as suggested (2000 IU daily).   Lab results today 06/10/2020 of CBC w/diff and CMP is as follows: all values are WNL except for Lymphs Abs of 0.5K, Glucose of 237, Creatinine of 1.13, ALT of 47, GFR  est of 53. 06/10/2020 LDH pending.  On review of systems, pt reports chronic cough and denies fevers, chills, abdominal pain, leg swelling, new lumps/bumps, SOB, back pain, pain over flanks, new tingling/numbness in hands and feet, and any other symptoms.  MEDICAL HISTORY:  Past Medical History:  Diagnosis Date  . Allergy    year around allergies  . Arthritis    fingers, knees, neck, back-osteoarthristis  . Bronchitis    hx of  . Complication of anesthesia   . 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 GERD (gastroesophageal reflux disease)    hx of reflux-went away with elevating HOB   . Hypertension   . Pneumonia    hx of   . PONV (postoperative nausea and vomiting)     SURGICAL HISTORY: Past Surgical History:  Procedure Laterality Date  . ABDOMINAL HYSTERECTOMY    . ADENOIDECTOMY    . cosmetic surgeries    . ESOPHAGEAL MANOMETRY N/A 12/02/2015   Procedure: ESOPHAGEAL MANOMETRY (EM);  Surgeon: [name], MD;  Location: [facility];  Service: Endoscopy;  Laterality: N/A;  . ESOPHAGOGASTRODUODENOSCOPY N/A 12/02/2015   Procedure: ESOPHAGOGASTRODUODENOSCOPY (EGD);  Surgeon: [name], MD;  Location: [facility];  Service: Endoscopy;  Laterality: N/A;  . lymph node removal left leg     cat scratch surgery   . THROAT SURGERY     sleep apnea surgery   . TONSILLECTOMY      SOCIAL HISTORY: Social History   Socioeconomic History  . Marital status: Married    Spouse name: Not on file  . Number of children: Not on file  . Years of education: Not on file  . Highest education level: Not on file  Occupational History  . Not on file  Tobacco Use  . Smoking status: Never Smoker  . Smokeless tobacco: Never Used  Vaping Use  . Vaping Use: Never used  Substance and Sexual Activity  . Alcohol use: No    Alcohol/week: 0.0 standard drinks  . Drug use: No  . Sexual activity: Not on file  Other Topics Concern  . 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 Lung cancer Father        1982 died from it  . Hernia Mother   . Breast cancer Maternal Aunt        multiple   . Breast cancer Maternal Grandmother   .  Breast cancer Paternal Grandmother   . Breast cancer Cousin        cousins multiple   . Colon cancer Neg Hx   . Colon polyps Neg Hx   . Rectal cancer Neg Hx   . Stomach cancer Neg Hx     ALLERGIES:  is allergic to latex, codeine, other, hydrocodone, and oxycodone.  MEDICATIONS:  Current Outpatient Medications  Medication Sig Dispense Refill  . acyclovir (ZOVIRAX) 400 MG tablet TAKE 1 TABLET(400 MG) BY MOUTH DAILY 30 tablet 6  . aspirin EC 81 MG tablet Take 81 mg by mouth daily.    Marland Kitchen dexamethasone (DECADRON) 4 MG tablet TAKE 2 TABLETS BY MOUTH DAILY FOR 3 DAYS, STARTING THE DAY AFTER COMPLETEING EACH CHEMOTHERAPY 30 tablet 0  . diphenhydrAMINE (BENADRYL) 25 MG tablet Take 25 mg by mouth at bedtime.    . Lactobacillus-Inulin (CULTURELLE DIGESTIVE HEALTH PO) Take 1 capsule by mouth at bedtime.    Marland Kitchen lisinopril-hydrochlorothiazide (ZESTORETIC) 20-12.5 MG tablet Take 1 tablet by mouth daily.    Marland Kitchen LORazepam (ATIVAN) 0.5 MG tablet TAKE 1 TABLET(0.5 MG) BY MOUTH EVERY 8 HOURS 30 tablet 0  . Magnesium Hydroxide (DULCOLAX PO) Take 1 tablet by mouth 2 (two) times daily.    . Melatonin 10 MG CAPS Take 10 mg by mouth at bedtime.     . meloxicam (MOBIC) 15 MG tablet Take 15 mg by mouth  daily.    . metFORMIN (GLUCOPHAGE) 500 MG tablet Take 500 mg by mouth daily.    . metoprolol succinate (TOPROL-XL) 50 MG 24 hr tablet Take 50 mg by mouth daily.    . Multiple Vitamin (MULTIVITAMIN) tablet Take 1 tablet by mouth daily.    . ondansetron (ZOFRAN) 8 MG tablet TAKE 1 TABLET BY MOUTH EVERY 8 HOURS AS NEEDED FOR NAUSEA. STARTING 2 DAYS AFTER EACH CHEMOTHERAPY 30 tablet 3  . predniSONE (DELTASONE) 10 MG tablet Take 10 mg by mouth 2 (two) times daily.    . prochlorperazine (COMPAZINE) 10 MG tablet Take 1 tablet (10 mg total) by mouth every 6 (six) hours as needed for nausea or vomiting. 30 tablet 0  . rosuvastatin (CRESTOR) 10 MG tablet Take 10 mg by mouth daily.    Marland Kitchen venlafaxine (EFFEXOR) 37.5 MG tablet Take 37.5 mg by mouth once.     No current facility-administered medications for this visit.    REVIEW OF SYSTEMS: 10 Point review of Systems was done is negative except as noted above.   PHYSICAL EXAMINATION: ECOG FS:1 - Symptomatic but completely ambulatory  There were no vitals filed for this visit. Wt Readings from Last 3 Encounters:  04/09/20 194 lb 14.4 oz (88.4 kg)  02/06/20 192 lb 12.8 oz (87.5 kg)  12/05/19 192 lb 14.4 oz (87.5 kg)   There is no height or weight on file to calculate BMI.   GENERAL:alert, in no acute distress and comfortable SKIN: no acute rashes, no significant lesions EYES: conjunctiva are pink and non-injected, sclera anicteric OROPHARYNX: MMM, no exudates, no oropharyngeal erythema or ulceration NECK: supple, no JVD LYMPH:  no palpable lymphadenopathy in the cervical, axillary or inguinal regions LUNGS: clear to auscultation b/l with normal respiratory effort HEART: regular rate & rhythm ABDOMEN:  normoactive bowel sounds , non tender, not distended. Extremity: no pedal edema PSYCH: alert & oriented x 3 with fluent speech NEURO: no focal motor/sensory deficits   LABORATORY DATA:  I have reviewed the data as listed  CBC Latest Ref Rng &  Units 04/09/2020  02/06/2020 12/05/2019  WBC 4.0 - 10.5 K/uL 4.4 5.1 3.3(L)  Hemoglobin 12.0 - 15.0 g/dL 13.7 12.9 13.0  Hematocrit 36.0 - 46.0 % 38.7 36.1 36.6  Platelets 150 - 400 K/uL 190 192 164    CMP Latest Ref Rng & Units 04/09/2020 02/06/2020 12/05/2019  Glucose 70 - 99 mg/dL 203(H) 203(H) 200(H)  BUN 8 - 23 mg/dL 16 13 17   Creatinine 0.44 - 1.00 mg/dL 1.08(H) 0.91 0.91  Sodium 135 - 145 mmol/L 139 139 138  Potassium 3.5 - 5.1 mmol/L 3.8 3.2(L) 3.5  Chloride 98 - 111 mmol/L 102 101 98  CO2 22 - 32 mmol/L 24 27 26   Calcium 8.9 - 10.3 mg/dL 9.7 9.8 9.9  Total Protein 6.5 - 8.1 g/dL 7.0 7.0 6.9  Total Bilirubin 0.3 - 1.2 mg/dL 0.4 0.4 0.5  Alkaline Phos 38 - 126 U/L 63 63 65  AST 15 - 41 U/L 27 19 23   ALT 0 - 44 U/L 39 32 34   . Lab Results  Component Value Date   LDH 223 (H) 04/09/2020   05/14/2019 CT ANGIO CHEST PE W OR WO CONTRAST (Accession [id number]):   03/08/2019 Lymph Node Biopsy [id number]):      RADIOGRAPHIC STUDIES: I have personally reviewed the radiological images as listed and agreed with the findings in the report. No results found.  ASSESSMENT & PLAN:   #1 Stage III/IV follicular lymphoma - likely low grade but accurate grading difficult  11/02/2018 MRI lumbar spine wo contrast revealed "Extensive retroperitoneal lymphadenopathy highly worrisome for neoplastic process such as lymphoproliferative disease. Spondylosis worst at L4-5 where there is narrowing in the right subarticular recess predominantly due to a synovial cyst off the medial margin of the right facet with encroachment on the descending right L5 root. There is mild central canal narrowing overall at this Level."  12/04/2018 CT CAP revealed "Mild-to-moderate abdominal and pelvic lymphadenopathy and new 9 mm left superior mediastinal lymph node, highly suspicious for lymphoproliferative disorder. Stable 8 mm lingular pulmonary nodule, consistent with benign Etiology. Moderate hepatic  steatosis. 1.3 cm indeterminate low-attenuation lesion in the right lobe. Recommend continued attention on follow-up CT."  12/18/2018 lymph node biopsy pathology revealing a follicular lymphoma- likely low grade but accurate grading not possible on limited sampling.  02/06/2019 PET/CT scan ([id number]) revealed "1. Bulky hypermetabolic lymphadenopathy in the abdomen and upper pelvis, as described. This is associated with mild hypermetabolic lymphadenopathy in the upper mediastinum and right axilla. Hypermetabolic lymphadenopathy represents a combination of Deauville 4 and Deauville 5 disease. 2. 8 mm nodule in the lingula without hypermetabolism. Attention on follow-up recommended. 3. Hepatic steatosis. 4.  Aortic Atherosclerois (ICD10-170.0)."  03/08/2019 Lymph Node Biopsy ([id number]) revealed "LYMPH NODE, NEEDLE CORE BIOPSY: - Follicular lymphoma (grade 1-2 of 3) with elevated proliferation rate."  2. Mild treatment related thrombocytopenia- resolved.  PLAN: -Discussed pt labwork today, 06/10/2020; blood counts and chemistries stable. LDH pending. -Advised pt that Lisinopril can be the cause for her chronic cough or postnasal drip. The pt is f/u w this w her PCP.  -Advised pt we will do a repeat scan following 2 years of maintenance treatment unless symptomatic, change in labs, or desired by pt. The pt is agreeable to this plan. -Advised pt to be aware of fevers, chills, new lumps/bumps, groin/leg swelling, abdominal pain. -No lab or clinical evidence of Follicular Lymphoma recurrence/progression at this time.  -The pt has no prohibitive toxicities from continuing maintenance Rituxan every two months. -Advised pt that there is no  visible disease at this time, so we could hold treatment for surgery if neccessary.  -Continue 2000 IU Vitamin D daily. -Will see back in 2 months w labs.   FOLLOW UP: Plz schedule next 2 cycles of maintenance Rituxan every 2 months with labs and MD visits     The total time spent in the appointment was 20  minutes and more than 50% was on counseling and direct patient cares, ordering and management of chemotherapy   All of the patient's questions were answered with apparent satisfaction. The patient knows to call the clinic with any problems, questions or concerns.   Sullivan Lone MD [place] AAHIVMS [facility] Hematology/Oncology Physician [facility]  (Office):       [phone] (Work cell):  [phone] (Fax):           [phone]  06/09/2020 9:54 AM  I, Reinaldo Raddle, am acting as scribe for Dr. Sullivan Lone, MD.     .I have reviewed the above documentation for accuracy and completeness, and I agree with the above. Brunetta Genera MD

## 2020-06-10 ENCOUNTER — Other Ambulatory Visit: Payer: Self-pay

## 2020-06-10 ENCOUNTER — Inpatient Hospital Stay: Payer: Medicare Other

## 2020-06-10 ENCOUNTER — Inpatient Hospital Stay: Payer: Medicare Other | Attending: Hematology

## 2020-06-10 ENCOUNTER — Inpatient Hospital Stay: Payer: Medicare Other | Admitting: Hematology

## 2020-06-10 VITALS — BP 135/75 | HR 98 | Temp 98.2°F | Resp 16 | Ht 62.0 in | Wt 194.1 lb

## 2020-06-10 VITALS — BP 109/66 | HR 91 | Temp 97.2°F | Resp 18

## 2020-06-10 DIAGNOSIS — R61 Generalized hyperhidrosis: Secondary | ICD-10-CM | POA: Insufficient documentation

## 2020-06-10 DIAGNOSIS — C8228 Follicular lymphoma grade III, unspecified, lymph nodes of multiple sites: Secondary | ICD-10-CM | POA: Diagnosis present

## 2020-06-10 DIAGNOSIS — Z7189 Other specified counseling: Secondary | ICD-10-CM

## 2020-06-10 DIAGNOSIS — Z8379 Family history of other diseases of the digestive system: Secondary | ICD-10-CM | POA: Diagnosis not present

## 2020-06-10 DIAGNOSIS — R519 Headache, unspecified: Secondary | ICD-10-CM | POA: Diagnosis not present

## 2020-06-10 DIAGNOSIS — M549 Dorsalgia, unspecified: Secondary | ICD-10-CM | POA: Insufficient documentation

## 2020-06-10 DIAGNOSIS — C8218 Follicular lymphoma grade II, lymph nodes of multiple sites: Secondary | ICD-10-CM

## 2020-06-10 DIAGNOSIS — Z5112 Encounter for antineoplastic immunotherapy: Secondary | ICD-10-CM

## 2020-06-10 DIAGNOSIS — H04129 Dry eye syndrome of unspecified lacrimal gland: Secondary | ICD-10-CM | POA: Insufficient documentation

## 2020-06-10 DIAGNOSIS — Z801 Family history of malignant neoplasm of trachea, bronchus and lung: Secondary | ICD-10-CM | POA: Insufficient documentation

## 2020-06-10 DIAGNOSIS — R053 Chronic cough: Secondary | ICD-10-CM | POA: Insufficient documentation

## 2020-06-10 DIAGNOSIS — Z79899 Other long term (current) drug therapy: Secondary | ICD-10-CM | POA: Insufficient documentation

## 2020-06-10 DIAGNOSIS — Z803 Family history of malignant neoplasm of breast: Secondary | ICD-10-CM | POA: Diagnosis not present

## 2020-06-10 LAB — CMP (CANCER CENTER ONLY)
ALT: 47 U/L — ABNORMAL HIGH (ref 0–44)
AST: 28 U/L (ref 15–41)
Albumin: 4.5 g/dL (ref 3.5–5.0)
Alkaline Phosphatase: 66 U/L (ref 38–126)
Anion gap: 15 (ref 5–15)
BUN: 19 mg/dL (ref 8–23)
CO2: 24 mmol/L (ref 22–32)
Calcium: 9.3 mg/dL (ref 8.9–10.3)
Chloride: 101 mmol/L (ref 98–111)
Creatinine: 1.13 mg/dL — ABNORMAL HIGH (ref 0.44–1.00)
GFR, Estimated: 53 mL/min — ABNORMAL LOW (ref >60)
Glucose, Bld: 237 mg/dL — ABNORMAL HIGH (ref 70–99)
Potassium: 3.8 mmol/L (ref 3.5–5.1)
Sodium: 140 mmol/L (ref 135–145)
Total Bilirubin: 0.4 mg/dL (ref 0.3–1.2)
Total Protein: 7.2 g/dL (ref 6.5–8.1)

## 2020-06-10 LAB — CBC WITH DIFFERENTIAL/PLATELET
Abs Immature Granulocytes: 0.04 10*3/uL (ref 0.00–0.07)
Basophils Absolute: 0.1 10*3/uL (ref 0.0–0.1)
Basophils Relative: 1 %
Eosinophils Absolute: 0.1 10*3/uL (ref 0.0–0.5)
Eosinophils Relative: 2 %
HCT: 37.7 % (ref 36.0–46.0)
Hemoglobin: 13.5 g/dL (ref 12.0–15.0)
Immature Granulocytes: 1 %
Lymphocytes Relative: 8 %
Lymphs Abs: 0.5 10*3/uL — ABNORMAL LOW (ref 0.7–4.0)
MCH: 31.1 pg (ref 26.0–34.0)
MCHC: 35.8 g/dL (ref 30.0–36.0)
MCV: 86.9 fL (ref 80.0–100.0)
Monocytes Absolute: 0.7 10*3/uL (ref 0.1–1.0)
Monocytes Relative: 12 %
Neutro Abs: 4.4 10*3/uL (ref 1.7–7.7)
Neutrophils Relative %: 76 %
Platelets: 184 10*3/uL (ref 150–400)
RBC: 4.34 MIL/uL (ref 3.87–5.11)
RDW: 12 % (ref 11.5–15.5)
WBC: 5.8 10*3/uL (ref 4.0–10.5)
nRBC: 0 % (ref 0.0–0.2)

## 2020-06-10 LAB — LACTATE DEHYDROGENASE: LDH: 206 U/L — ABNORMAL HIGH (ref 98–192)

## 2020-06-10 MED ORDER — FAMOTIDINE IN NACL 20-0.9 MG/50ML-% IV SOLN
INTRAVENOUS | Status: AC
  Filled 2020-06-10: qty 50

## 2020-06-10 MED ORDER — DIPHENHYDRAMINE HCL 25 MG PO CAPS
50.0000 mg | ORAL_CAPSULE | Freq: Once | ORAL | Status: AC
  Administered 2020-06-10: 50 mg via ORAL

## 2020-06-10 MED ORDER — FAMOTIDINE IN NACL 20-0.9 MG/50ML-% IV SOLN
20.0000 mg | Freq: Once | INTRAVENOUS | Status: AC
  Administered 2020-06-10: 20 mg via INTRAVENOUS

## 2020-06-10 MED ORDER — DIPHENHYDRAMINE HCL 25 MG PO CAPS
ORAL_CAPSULE | ORAL | Status: AC
  Filled 2020-06-10: qty 2

## 2020-06-10 MED ORDER — SODIUM CHLORIDE 0.9 % IV SOLN
Freq: Once | INTRAVENOUS | Status: AC
  Filled 2020-06-10: qty 250

## 2020-06-10 MED ORDER — ACETAMINOPHEN 325 MG PO TABS
650.0000 mg | ORAL_TABLET | Freq: Once | ORAL | Status: AC
  Administered 2020-06-10: 650 mg via ORAL

## 2020-06-10 MED ORDER — ACETAMINOPHEN 325 MG PO TABS
ORAL_TABLET | ORAL | Status: AC
  Filled 2020-06-10: qty 2

## 2020-06-10 MED ORDER — SODIUM CHLORIDE 0.9 % IV SOLN
10.0000 mg | Freq: Once | INTRAVENOUS | Status: AC
  Administered 2020-06-10: 10 mg via INTRAVENOUS
  Filled 2020-06-10: qty 10

## 2020-06-10 MED ORDER — SODIUM CHLORIDE 0.9 % IV SOLN
375.0000 mg/m2 | Freq: Once | INTRAVENOUS | Status: AC
  Administered 2020-06-10: 700 mg via INTRAVENOUS
  Filled 2020-06-10: qty 50

## 2020-06-10 NOTE — Patient Instructions (Signed)
[facility] Discharge Instructions for Patients Receiving Chemotherapy  Today you received the following chemotherapy agents:  Rituxan.  To help prevent nausea and vomiting after your treatment, we encourage you to take your nausea medication as directed.   If you develop nausea and vomiting that is not controlled by your nausea medication, call the clinic.   BELOW ARE SYMPTOMS THAT SHOULD BE REPORTED IMMEDIATELY:  *FEVER GREATER THAN 100.5 F  *CHILLS WITH OR WITHOUT FEVER  NAUSEA AND VOMITING THAT IS NOT CONTROLLED WITH YOUR NAUSEA MEDICATION  *UNUSUAL SHORTNESS OF BREATH  *UNUSUAL BRUISING OR BLEEDING  TENDERNESS IN MOUTH AND THROAT WITH OR WITHOUT PRESENCE OF ULCERS  *URINARY PROBLEMS  *BOWEL PROBLEMS  UNUSUAL RASH Items with * indicate a potential emergency and should be followed up as soon as possible.  Feel free to call the clinic should you have any questions or concerns. The clinic phone number is [phone].  Please show the CHEMO ALERT CARD at check-in to the Emergency Department and triage nurse.   

## 2020-08-05 NOTE — Progress Notes (Signed)
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 female who has been referred to [place] by Dr Layne Benton for evaluation and management of possible lymphoproliferative disease.The pt reports that she is doing well overall.  The pt reports that she had an MRI earlier this month because of her back pain. She has had back pain for about 30 years, but in May, it got so bad that she could not move. The pain radiated around her hip and down to her knee. She could not even get in her car to go to the ER. She put herself on bedrest for 5 weeks which improved her symptoms. Now, her back pain is back to baseline.  Pt reported that, in the past 6 months, she has been moving her bowels 3-4x daily, which is much more than normal for her. Throughout the day, her stools get progressively looser. She went to the ER on 10/11/2018 for food poisoning from mayonnaise because she had blood in the stool and cramping. The symptoms began 2 hours after she ate the mayo. She was not placed on antibiotics or any other medications. No stool testing was done. Her symptoms resolved in 2 days.  She experienced night sweats up until 2006, and they returned 6 months ago. She does not take hormone replacements because of her FHx of breast cancer. She also reports some new fatigue, dry eyes, and headaches over the last 6 months. Denies unexpected weight loss. She has not been diagnosed with diabetes.  Of note prior to the patient's visit today, pt has had MRI lumbar spine wo contrast completed on 11/02/2018 with results revealing "Extensive retroperitoneal lymphadenopathy highly worrisome for neoplastic process such as lymphoproliferative disease. 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 There is mild central canal narrowing overall at this Level."  Most recent lab results (10/11/2018) of CBC is as follows: all values are WNL except for Potassium at 3.3, glucose bld at 212. 10/11/2018 Urinalysis revealed hazy appearance 10/11/2018 Lipase at 29  On review of systems, pt reports back pain, 3-4 bowel movements daily, dry eyes, headaches, night sweats, and denies unexpected weight loss, mouth sores, belly pain, changes in urination trouble swallowing, fever, chills, and any other symptoms.   On PMHx the pt reports total hysterectomy at 38 years ago due to ovarian cysts, lymphoreticulosis, sleep apnea related surgery, breast augmentation with no removal of breast tissue On Social Hx the pt reports never smoking On Family Hx the pt reports breast cancer on both sides of the family.   INTERVAL HISTORY  Meghan Alexander is a 65 y.o. female presenting today for the management and evaluation of her follicular lymphoma and maintenance Rituxan. The patient's last visit with [place] was on 04/09/2020. The pt reports that she is doing well overall. She is here for C6 maintenance Rituxan.  The pt reports no new symptoms or concerns. The pt notes some medication changes, as she has stopped the Lisinopril and is now on Losartan. The pt notes that this has improved her cough she was having. The pt notes leg cramping, but believes this is due to  inadequate water intake daily. The pt notes these were worse when she was camping and during the nighttime. The pt denies having much cramping outside of her camping trip recently. The pt notes that three weeks ago she had a sizeable bruise on her shoulder alongside some pain. The pt notes this has since resolved and denies any trauma or heavy lifting that could have caused this. The pt notes that  she went to towel her hair and heard a loud cracking noise followed by the large bruise the next day.  Lab results today 08/06/2020 of CBC w/diff and CMP is as follows: all values are WNL except for Lymphs Abs of 0.5K, Glucose of 237, Creatinine of 1.05, ALT of 49, GFR est of 58.  08/06/2020 LDH of 215.  On review of systems, pt reports left shoulder pain, lower extremity muscle cramping and denies leg swelling, abdominal pain, back pain, and any other symptoms.  MEDICAL HISTORY:  Past Medical History:  Diagnosis Date  . Allergy    year around allergies  . Arthritis    fingers, knees, neck, back-osteoarthristis  . Bronchitis    hx of  . Complication of anesthesia   . 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 GERD (gastroesophageal reflux disease)    hx of reflux-went away with elevating HOB   . Hypertension   . Pneumonia    hx of   . PONV (postoperative nausea and vomiting)     SURGICAL HISTORY: Past Surgical History:  Procedure Laterality Date  . ABDOMINAL HYSTERECTOMY    . ADENOIDECTOMY    . cosmetic surgeries    . ESOPHAGEAL MANOMETRY N/A 12/02/2015   Procedure: ESOPHAGEAL MANOMETRY (EM);  Surgeon: [name], MD;  Location: [facility];  Service: Endoscopy;  Laterality: N/A;  . ESOPHAGOGASTRODUODENOSCOPY N/A 12/02/2015   Procedure: ESOPHAGOGASTRODUODENOSCOPY (EGD);  Surgeon: [name], MD;  Location: [facility];  Service: Endoscopy;  Laterality: N/A;  . lymph node removal left leg     cat scratch surgery   . THROAT SURGERY     sleep apnea surgery   . TONSILLECTOMY      SOCIAL HISTORY: Social History   Socioeconomic History  . Marital status: Married    Spouse name: Not on file  . Number of children: Not on file  . Years of education: Not on file  . Highest education level: Not on file  Occupational History  . Not on file  Tobacco Use  . Smoking status: Never Smoker  . Smokeless tobacco: Never Used  Vaping Use   . Vaping Use: Never used  Substance and Sexual Activity  . Alcohol use: No    Alcohol/week: 0.0 standard drinks  . Drug use: No  . Sexual activity: Not on file  Other Topics Concern  . 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 Lung cancer Father  1982 died from it  . Hernia Mother   . Breast cancer Maternal Aunt        multiple   . Breast cancer Maternal Grandmother   . Breast cancer Paternal Grandmother   . Breast cancer Cousin        cousins multiple   . Colon cancer Neg Hx   . Colon polyps Neg Hx   . Rectal cancer Neg Hx   . Stomach cancer Neg Hx     ALLERGIES:  is allergic to latex, codeine, other, hydrocodone, and oxycodone.  MEDICATIONS:  Current Outpatient Medications  Medication Sig Dispense Refill  . acyclovir (ZOVIRAX) 400 MG tablet TAKE 1 TABLET(400 MG) BY MOUTH DAILY 30 tablet 6  . aspirin EC 81 MG tablet Take 81 mg by mouth daily.    Marland Kitchen dexamethasone (DECADRON) 4 MG tablet TAKE 2 TABLETS BY MOUTH DAILY FOR 3 DAYS, STARTING THE DAY AFTER COMPLETEING EACH CHEMOTHERAPY 30 tablet 0  . diphenhydrAMINE (BENADRYL) 25 MG tablet Take 25 mg by mouth at bedtime.    . Lactobacillus-Inulin (CULTURELLE DIGESTIVE HEALTH PO) Take 1 capsule by mouth at bedtime.    Marland Kitchen lisinopril-hydrochlorothiazide (ZESTORETIC) 20-12.5 MG tablet Take 1 tablet by mouth daily.    Marland Kitchen LORazepam (ATIVAN) 0.5 MG tablet TAKE 1 TABLET(0.5 MG) BY MOUTH EVERY 8 HOURS 30 tablet  0  . Magnesium Hydroxide (DULCOLAX PO) Take 1 tablet by mouth 2 (two) times daily.    . Melatonin 10 MG CAPS Take 10 mg by mouth at bedtime.     . meloxicam (MOBIC) 15 MG tablet Take 15 mg by mouth daily.    . metFORMIN (GLUCOPHAGE) 500 MG tablet Take 500 mg by mouth daily.    . metoprolol succinate (TOPROL-XL) 50 MG 24 hr tablet Take 50 mg by mouth daily.    . Multiple Vitamin (MULTIVITAMIN) tablet Take 1 tablet by mouth daily.    . ondansetron (ZOFRAN) 8 MG tablet TAKE 1 TABLET BY MOUTH EVERY 8 HOURS AS NEEDED FOR NAUSEA. STARTING 2 DAYS AFTER EACH CHEMOTHERAPY 30 tablet 3  . predniSONE (DELTASONE) 10 MG tablet Take 10 mg by mouth 2 (two) times daily.    . prochlorperazine (COMPAZINE) 10 MG tablet Take 1 tablet (10 mg total) by mouth every 6 (six) hours as needed for nausea or vomiting. 30 tablet 0  . rosuvastatin (CRESTOR) 10 MG tablet Take 10 mg by mouth daily.    Marland Kitchen venlafaxine (EFFEXOR) 37.5 MG tablet Take 37.5 mg by mouth once.     No current facility-administered medications for this visit.    REVIEW OF SYSTEMS: 10 Point review of Systems was done is negative except as noted above.   PHYSICAL EXAMINATION: ECOG FS:1 - Symptomatic but completely ambulatory  Vitals:   08/06/20 1104  BP: 128/76  Pulse: 94  Resp: 18  Temp: 98.4 F (36.9 C)  SpO2: 100%   Wt Readings from Last 3 Encounters:  08/06/20 195 lb (88.5 kg)  06/10/20 194 lb 1.6 oz (88 kg)  04/09/20 194 lb 14.4 oz (88.4 kg)   Body mass index is 35.67 kg/m.   NAD. GENERAL:alert, in no acute distress and comfortable SKIN: no acute rashes, no significant lesions EYES: conjunctiva are pink and non-injected, sclera anicteric OROPHARYNX: MMM, no exudates, no oropharyngeal erythema or ulceration NECK: supple, no JVD LYMPH:  no palpable lymphadenopathy in the cervical, axillary or inguinal regions LUNGS: clear to auscultation b/l with normal respiratory effort HEART: regular rate & rhythm ABDOMEN:  normoactive bowel  sounds , non tender, not distended. Extremity: no pedal edema PSYCH: alert & oriented x 3 with fluent speech NEURO: no focal motor/sensory deficits   LABORATORY DATA:  I have reviewed the data as listed  CBC Latest Ref Rng & Units 08/06/2020 06/10/2020 04/09/2020  WBC 4.0 - 10.5 K/uL 4.1 5.8 4.4  Hemoglobin 12.0 - 15.0 g/dL 13.4 13.5 13.7  Hematocrit 36.0 - 46.0 % 38.1 37.7 38.7  Platelets 150 - 400 K/uL 223 184 190    CMP Latest Ref Rng & Units 08/06/2020 06/10/2020 04/09/2020  Glucose 70 - 99 mg/dL 237(H) 237(H) 203(H)  BUN 8 - 23 mg/dL 14 19 16   Creatinine 0.44 - 1.00 mg/dL 1.05(H) 1.13(H) 1.08(H)  Sodium 135 - 145 mmol/L 141 140 139  Potassium 3.5 - 5.1 mmol/L 3.8 3.8 3.8  Chloride 98 - 111 mmol/L 99 101 102  CO2 22 - 32 mmol/L 25 24 24   Calcium 8.9 - 10.3 mg/dL 9.9 9.3 9.7  Total Protein 6.5 - 8.1 g/dL 7.1 7.2 7.0  Total Bilirubin 0.3 - 1.2 mg/dL 0.4 0.4 0.4  Alkaline Phos 38 - 126 U/L 66 66 63  AST 15 - 41 U/L 30 28 27   ALT 0 - 44 U/L 49(H) 47(H) 39   . Lab Results  Component Value Date   LDH 215 (H) 08/06/2020   05/14/2019 CT ANGIO CHEST PE W OR WO CONTRAST (Accession [id number]):   03/08/2019 Lymph Node Biopsy ([id number]):      RADIOGRAPHIC STUDIES: I have personally reviewed the radiological images as listed and agreed with the findings in the report. No results found.  ASSESSMENT & PLAN:   #1 Stage III/IV follicular lymphoma - likely low grade but accurate grading difficult  11/02/2018 MRI lumbar spine wo contrast revealed "Extensive retroperitoneal lymphadenopathy highly worrisome for neoplastic process such as lymphoproliferative disease. Spondylosis worst at L4-5 where there is narrowing in the right subarticular recess predominantly due to a synovial cyst off the medial margin of the right facet with encroachment on the descending right L5 root. There is mild central canal narrowing overall at this Level."  12/04/2018 CT CAP revealed  "Mild-to-moderate abdominal and pelvic lymphadenopathy and new 9 mm left superior mediastinal lymph node, highly suspicious for lymphoproliferative disorder. Stable 8 mm lingular pulmonary nodule, consistent with benign Etiology. Moderate hepatic steatosis. 1.3 cm indeterminate low-attenuation lesion in the right lobe. Recommend continued attention on follow-up CT."  12/18/2018 lymph node biopsy pathology revealing a follicular lymphoma- likely low grade but accurate grading not possible on limited sampling.  02/06/2019 PET/CT scan ([id number]) revealed "1. Bulky hypermetabolic lymphadenopathy in the abdomen and upper pelvis, as described. This is associated with mild hypermetabolic lymphadenopathy in the upper mediastinum and right axilla. Hypermetabolic lymphadenopathy represents a combination of Deauville 4 and Deauville 5 disease. 2. 8 mm nodule in the lingula without hypermetabolism. Attention on follow-up recommended. 3. Hepatic steatosis. 4.  Aortic Atherosclerois (ICD10-170.0)."  03/08/2019 Lymph Node Biopsy ([id number]) revealed "LYMPH NODE, NEEDLE CORE BIOPSY: - Follicular lymphoma (grade 1-2 of 3) with elevated proliferation rate."  2. Mild treatment related thrombocytopenia- resolved.  PLAN: -Discussed pt labwork today, 08/06/2020; blood counts normal, blood chemistries stable, LDH stable. -Advised pt that Crestor can cause muscle cramping. The pt has been on this for over a year. -Advised pt that this dose six of maintenance treatment will be the one year mark. -Advised pt we will check Vitamin B12 labs at next visit to observe for subtle deficiencies due to medications she is taking. -Recommended  pt avoid any heavy lifting on left shoulder area that is painful.dvised pt she would need MRI if consistently painful with no improvement in the future. -Recommended pt receive the second COVID booster shot as recently approved. Advised pt to wait 4-6 months following first booster shot  before getting this. -Discussed Evusheld and pt's eligibility. Will send referral. Advised pt to wait around one month after this before getting the second booster shot. -No lab or clinical evidence of Follicular Lymphoma recurrence/progression at this time.  -The pt has no prohibitive toxicities from continuing C6 maintenance Rituxan every two months. -Continue 2000 IU Vitamin D daily. -Recommended pt take Multivitamin daily and Vitamin B12.  -Start Vitamin B-Complex daily. -Will see back in 2.5 months with next Rituxan. Advised pt we will move the current appt on 06/16 as the pt wishes to see Dr. Irene Limbo and not NP.   FOLLOW UP: -Ambulatory referral for Evusheld -Please move labs, next dose of maintenance Rituxan and MD visit from 10/08/2020 later by 2 weeks (within a week after 6/25 - due to PAL)   The total time spent in the appointment was 20 minutes and more than 50% was on counseling and direct patient cares.   All of the patient's questions were answered with apparent satisfaction. The patient knows to call the clinic with any problems, questions or concerns.   Sullivan Lone MD [place] [facility]IV[facility] Hematology/Oncology Physician [facility]  (Office):       [phone] (Work cell):  [phone] (Fax):           [phone]  08/06/2020 11:25 AM  I, Reinaldo Raddle, am acting as scribe for Dr. Sullivan Lone, MD.   .I have reviewed the above documentation for accuracy and completeness, and I agree with the above. Brunetta Genera MD

## 2020-08-06 ENCOUNTER — Inpatient Hospital Stay (HOSPITAL_BASED_OUTPATIENT_CLINIC_OR_DEPARTMENT_OTHER): Payer: Medicare Other | Admitting: Hematology

## 2020-08-06 ENCOUNTER — Other Ambulatory Visit: Payer: Self-pay

## 2020-08-06 ENCOUNTER — Inpatient Hospital Stay: Payer: Medicare Other

## 2020-08-06 ENCOUNTER — Inpatient Hospital Stay: Payer: Medicare Other | Attending: Hematology

## 2020-08-06 VITALS — BP 128/76 | HR 94 | Temp 98.4°F | Resp 18 | Wt 195.0 lb

## 2020-08-06 VITALS — BP 122/76 | HR 94 | Temp 98.5°F | Resp 18

## 2020-08-06 DIAGNOSIS — C8218 Follicular lymphoma grade II, lymph nodes of multiple sites: Secondary | ICD-10-CM

## 2020-08-06 DIAGNOSIS — R519 Headache, unspecified: Secondary | ICD-10-CM | POA: Diagnosis not present

## 2020-08-06 DIAGNOSIS — M549 Dorsalgia, unspecified: Secondary | ICD-10-CM | POA: Diagnosis not present

## 2020-08-06 DIAGNOSIS — Z801 Family history of malignant neoplasm of trachea, bronchus and lung: Secondary | ICD-10-CM | POA: Insufficient documentation

## 2020-08-06 DIAGNOSIS — Z8379 Family history of other diseases of the digestive system: Secondary | ICD-10-CM | POA: Insufficient documentation

## 2020-08-06 DIAGNOSIS — R61 Generalized hyperhidrosis: Secondary | ICD-10-CM | POA: Diagnosis not present

## 2020-08-06 DIAGNOSIS — Z803 Family history of malignant neoplasm of breast: Secondary | ICD-10-CM | POA: Diagnosis not present

## 2020-08-06 DIAGNOSIS — Z79899 Other long term (current) drug therapy: Secondary | ICD-10-CM | POA: Diagnosis not present

## 2020-08-06 DIAGNOSIS — H04129 Dry eye syndrome of unspecified lacrimal gland: Secondary | ICD-10-CM | POA: Diagnosis not present

## 2020-08-06 DIAGNOSIS — Z5112 Encounter for antineoplastic immunotherapy: Secondary | ICD-10-CM | POA: Insufficient documentation

## 2020-08-06 DIAGNOSIS — Z7189 Other specified counseling: Secondary | ICD-10-CM

## 2020-08-06 LAB — CBC WITH DIFFERENTIAL/PLATELET
Abs Immature Granulocytes: 0.03 10*3/uL (ref 0.00–0.07)
Basophils Absolute: 0 10*3/uL (ref 0.0–0.1)
Basophils Relative: 1 %
Eosinophils Absolute: 0.1 10*3/uL (ref 0.0–0.5)
Eosinophils Relative: 3 %
HCT: 38.1 % (ref 36.0–46.0)
Hemoglobin: 13.4 g/dL (ref 12.0–15.0)
Immature Granulocytes: 1 %
Lymphocytes Relative: 13 %
Lymphs Abs: 0.5 10*3/uL — ABNORMAL LOW (ref 0.7–4.0)
MCH: 31.2 pg (ref 26.0–34.0)
MCHC: 35.2 g/dL (ref 30.0–36.0)
MCV: 88.6 fL (ref 80.0–100.0)
Monocytes Absolute: 0.8 10*3/uL (ref 0.1–1.0)
Monocytes Relative: 19 %
Neutro Abs: 2.6 10*3/uL (ref 1.7–7.7)
Neutrophils Relative %: 63 %
Platelets: 223 10*3/uL (ref 150–400)
RBC: 4.3 MIL/uL (ref 3.87–5.11)
RDW: 12.2 % (ref 11.5–15.5)
WBC: 4.1 10*3/uL (ref 4.0–10.5)
nRBC: 0 % (ref 0.0–0.2)

## 2020-08-06 LAB — CMP (CANCER CENTER ONLY)
ALT: 49 U/L — ABNORMAL HIGH (ref 0–44)
AST: 30 U/L (ref 15–41)
Albumin: 4.5 g/dL (ref 3.5–5.0)
Alkaline Phosphatase: 66 U/L (ref 38–126)
Anion gap: 17 — ABNORMAL HIGH (ref 5–15)
BUN: 14 mg/dL (ref 8–23)
CO2: 25 mmol/L (ref 22–32)
Calcium: 9.9 mg/dL (ref 8.9–10.3)
Chloride: 99 mmol/L (ref 98–111)
Creatinine: 1.05 mg/dL — ABNORMAL HIGH (ref 0.44–1.00)
GFR, Estimated: 58 mL/min — ABNORMAL LOW (ref >60)
Glucose, Bld: 237 mg/dL — ABNORMAL HIGH (ref 70–99)
Potassium: 3.8 mmol/L (ref 3.5–5.1)
Sodium: 141 mmol/L (ref 135–145)
Total Bilirubin: 0.4 mg/dL (ref 0.3–1.2)
Total Protein: 7.1 g/dL (ref 6.5–8.1)

## 2020-08-06 LAB — LACTATE DEHYDROGENASE: LDH: 215 U/L — ABNORMAL HIGH (ref 98–192)

## 2020-08-06 MED ORDER — ACETAMINOPHEN 325 MG PO TABS
650.0000 mg | ORAL_TABLET | Freq: Once | ORAL | Status: AC
  Administered 2020-08-06: 650 mg via ORAL

## 2020-08-06 MED ORDER — DIPHENHYDRAMINE HCL 25 MG PO CAPS
ORAL_CAPSULE | ORAL | Status: AC
  Filled 2020-08-06: qty 2

## 2020-08-06 MED ORDER — FAMOTIDINE IN NACL 20-0.9 MG/50ML-% IV SOLN
20.0000 mg | Freq: Once | INTRAVENOUS | Status: AC
  Administered 2020-08-06: 20 mg via INTRAVENOUS

## 2020-08-06 MED ORDER — DIPHENHYDRAMINE HCL 25 MG PO CAPS
50.0000 mg | ORAL_CAPSULE | Freq: Once | ORAL | Status: AC
  Administered 2020-08-06: 50 mg via ORAL

## 2020-08-06 MED ORDER — FAMOTIDINE IN NACL 20-0.9 MG/50ML-% IV SOLN
INTRAVENOUS | Status: AC
  Filled 2020-08-06: qty 50

## 2020-08-06 MED ORDER — SODIUM CHLORIDE 0.9 % IV SOLN
Freq: Once | INTRAVENOUS | Status: AC
  Filled 2020-08-06: qty 250

## 2020-08-06 MED ORDER — SODIUM CHLORIDE 0.9 % IV SOLN
375.0000 mg/m2 | Freq: Once | INTRAVENOUS | Status: AC
  Administered 2020-08-06: 700 mg via INTRAVENOUS
  Filled 2020-08-06: qty 50

## 2020-08-06 MED ORDER — ACETAMINOPHEN 325 MG PO TABS
ORAL_TABLET | ORAL | Status: AC
  Filled 2020-08-06: qty 2

## 2020-08-06 MED ORDER — SODIUM CHLORIDE 0.9 % IV SOLN
10.0000 mg | Freq: Once | INTRAVENOUS | Status: AC
  Administered 2020-08-06: 10 mg via INTRAVENOUS
  Filled 2020-08-06: qty 10

## 2020-08-06 NOTE — Patient Instructions (Signed)
[facility] Discharge Instructions for Patients Receiving Chemotherapy  Today you received the following chemotherapy agents:  Rituxan.  To help prevent nausea and vomiting after your treatment, we encourage you to take your nausea medication as directed.   If you develop nausea and vomiting that is not controlled by your nausea medication, call the clinic.   BELOW ARE SYMPTOMS THAT SHOULD BE REPORTED IMMEDIATELY:  *FEVER GREATER THAN 100.5 F  *CHILLS WITH OR WITHOUT FEVER  NAUSEA AND VOMITING THAT IS NOT CONTROLLED WITH YOUR NAUSEA MEDICATION  *UNUSUAL SHORTNESS OF BREATH  *UNUSUAL BRUISING OR BLEEDING  TENDERNESS IN MOUTH AND THROAT WITH OR WITHOUT PRESENCE OF ULCERS  *URINARY PROBLEMS  *BOWEL PROBLEMS  UNUSUAL RASH Items with * indicate a potential emergency and should be followed up as soon as possible.  Feel free to call the clinic should you have any questions or concerns. The clinic phone number is [phone].  Please show the CHEMO ALERT CARD at check-in to the Emergency Department and triage nurse.   

## 2020-08-07 ENCOUNTER — Telehealth: Payer: Self-pay | Admitting: Hematology

## 2020-08-07 NOTE — Telephone Encounter (Signed)
Rescheduled upcoming appointment per 4/14 los. Patient is aware of changes.

## 2020-08-16 ENCOUNTER — Telehealth: Payer: Self-pay | Admitting: Physician Assistant

## 2020-08-16 ENCOUNTER — Other Ambulatory Visit: Payer: Self-pay | Admitting: Physician Assistant

## 2020-08-16 DIAGNOSIS — C8218 Follicular lymphoma grade II, lymph nodes of multiple sites: Secondary | ICD-10-CM

## 2020-08-16 NOTE — Progress Notes (Signed)
I connected by phone with Meghan Alexander on 08/16/2020, 8:19 PM to discuss the potential use of a new treatment, tixagevimab/cilgavimab, for pre-exposure prophylaxis for prevention of coronavirus disease 2019 (COVID-19) caused by the SARS-CoV-2 virus.  This patient is a 65 y.o. female that meets the FDA criteria for Emergency Use Authorization of tixagevimab/cilgavimab for pre-exposure prophylaxis of COVID-19 disease. Pt meets following criteria:  Age >12 yr and weight > 40kg  Not currently infected with SARS-CoV-2 and has no known recent exposure to an individual infected with SARS-CoV-2 AND o Who has moderate to severe immune compromise due to a medical condition or receipt of immunosuppressive medications or treatments and may not mount an adequate immune response to COVID-19 vaccination or  o Vaccination with any available COVID-19 vaccine, according to the approved or authorized schedule, is not recommended due to a history of severe adverse reaction (e.g., severe allergic reaction) to a COVID-19 vaccine(s) and/or COVID-19 vaccine component(s).  o Patient meets the following definition of mod-severe immune compromised status: 1. Received B-cell depleting therapies (e.g. rituximab, obinutuzumab, ocrelizumab, alemtuzumab) within last 6 months & age > or = 69, 2. Received B-cell depleting therapies within the last 6 months and age < 77yrs and 6. Other actively treated hematologic malignancies or severe congenital immunodeficiency syndromes  I have spoken and communicated the following to the patient or parent/caregiver regarding COVID monoclonal antibody treatment:  1. FDA has authorized the emergency use of tixagevimab/cilgavimab for the pre-exposure prophylaxis of COVID-19 in patients with moderate-severe immunocompromised status, who meet above EUA criteria.  2. The significant known and potential risks and benefits of COVID monoclonal antibody, and the extent to which such potential risks and  benefits are unknown.  3. Information on available alternative treatments and the risks and benefits of those alternatives, including clinical trials.  4. The patient or parent/caregiver has the option to accept or refuse COVID monoclonal antibody treatment.  After reviewing this information with the patient, agree to receive tixagevimab/cilgavimab  Meghan Form, PA-C, 08/16/2020, 8:19 PM

## 2020-08-16 NOTE — Telephone Encounter (Signed)
Called pt about Evusheld (tixagevimab co-packaged with cilgavimab) for pre-exposure prophylaxis for prevention of coronavirus disease 2019 (COVID-19) caused by the SARS-CoV-2 virus. The patient is a candidate for this therapy given increased risk for severe disease caused by immunosuppression.    Unable to reach pt- left VM and mychart message.   Keng Jewel PA-C  [facility]     

## 2020-08-17 ENCOUNTER — Telehealth: Payer: Self-pay | Admitting: Hematology

## 2020-08-17 NOTE — Telephone Encounter (Signed)
Scheduled appt per 4/24 sch msg. Called pt, no answer. Left msg with appt date and time.  

## 2020-08-30 ENCOUNTER — Other Ambulatory Visit: Payer: Self-pay | Admitting: Hematology

## 2020-09-02 ENCOUNTER — Inpatient Hospital Stay: Payer: Medicare Other | Attending: Hematology

## 2020-09-02 ENCOUNTER — Other Ambulatory Visit: Payer: Self-pay

## 2020-09-02 NOTE — Progress Notes (Signed)
Per Dr. Irene Limbo, do not proceed with Evusheld injection due to recent GI bleed diagnosis. Patient verbalized understanding.

## 2020-09-03 ENCOUNTER — Telehealth: Payer: Self-pay | Admitting: Hematology

## 2020-09-03 NOTE — Telephone Encounter (Signed)
Called pt to r/s appt. Pt was unable to look at calendar at time of call and told me she would call back to r/s.

## 2020-10-08 ENCOUNTER — Ambulatory Visit: Payer: Medicare Other | Admitting: Hematology

## 2020-10-08 ENCOUNTER — Ambulatory Visit: Payer: Medicare Other

## 2020-10-08 ENCOUNTER — Other Ambulatory Visit: Payer: Medicare Other

## 2020-10-19 ENCOUNTER — Telehealth: Payer: Self-pay | Admitting: Hematology

## 2020-10-19 NOTE — Telephone Encounter (Signed)
Left message with changed MD appointment due to provider out of office.

## 2020-10-20 ENCOUNTER — Telehealth: Payer: Self-pay | Admitting: Hematology and Oncology

## 2020-10-20 ENCOUNTER — Other Ambulatory Visit: Payer: Self-pay | Admitting: Adult Health

## 2020-10-20 ENCOUNTER — Inpatient Hospital Stay (HOSPITAL_BASED_OUTPATIENT_CLINIC_OR_DEPARTMENT_OTHER): Payer: Medicare Other | Admitting: Hematology and Oncology

## 2020-10-20 ENCOUNTER — Inpatient Hospital Stay: Payer: Medicare Other | Attending: Hematology

## 2020-10-20 ENCOUNTER — Inpatient Hospital Stay: Payer: Medicare Other | Admitting: Hematology

## 2020-10-20 ENCOUNTER — Other Ambulatory Visit: Payer: Self-pay

## 2020-10-20 ENCOUNTER — Inpatient Hospital Stay: Payer: Medicare Other

## 2020-10-20 VITALS — BP 126/67 | HR 95 | Temp 98.7°F | Resp 16

## 2020-10-20 DIAGNOSIS — Z298 Encounter for other specified prophylactic measures: Secondary | ICD-10-CM | POA: Insufficient documentation

## 2020-10-20 DIAGNOSIS — R5383 Other fatigue: Secondary | ICD-10-CM | POA: Diagnosis not present

## 2020-10-20 DIAGNOSIS — R110 Nausea: Secondary | ICD-10-CM | POA: Insufficient documentation

## 2020-10-20 DIAGNOSIS — R590 Localized enlarged lymph nodes: Secondary | ICD-10-CM | POA: Diagnosis not present

## 2020-10-20 DIAGNOSIS — C8218 Follicular lymphoma grade II, lymph nodes of multiple sites: Secondary | ICD-10-CM

## 2020-10-20 DIAGNOSIS — Z5112 Encounter for antineoplastic immunotherapy: Secondary | ICD-10-CM | POA: Diagnosis present

## 2020-10-20 DIAGNOSIS — Z885 Allergy status to narcotic agent status: Secondary | ICD-10-CM | POA: Diagnosis not present

## 2020-10-20 DIAGNOSIS — Z9221 Personal history of antineoplastic chemotherapy: Secondary | ICD-10-CM | POA: Insufficient documentation

## 2020-10-20 DIAGNOSIS — Z79899 Other long term (current) drug therapy: Secondary | ICD-10-CM | POA: Insufficient documentation

## 2020-10-20 DIAGNOSIS — Z7189 Other specified counseling: Secondary | ICD-10-CM

## 2020-10-20 LAB — CMP (CANCER CENTER ONLY)
ALT: 53 U/L — ABNORMAL HIGH (ref 0–44)
AST: 34 U/L (ref 15–41)
Albumin: 4.1 g/dL (ref 3.5–5.0)
Alkaline Phosphatase: 55 U/L (ref 38–126)
Anion gap: 14 (ref 5–15)
BUN: 16 mg/dL (ref 8–23)
CO2: 24 mmol/L (ref 22–32)
Calcium: 9.6 mg/dL (ref 8.9–10.3)
Chloride: 101 mmol/L (ref 98–111)
Creatinine: 1.15 mg/dL — ABNORMAL HIGH (ref 0.44–1.00)
GFR, Estimated: 52 mL/min — ABNORMAL LOW (ref >60)
Glucose, Bld: 231 mg/dL — ABNORMAL HIGH (ref 70–99)
Potassium: 3.8 mmol/L (ref 3.5–5.1)
Sodium: 139 mmol/L (ref 135–145)
Total Bilirubin: 0.4 mg/dL (ref 0.3–1.2)
Total Protein: 7 g/dL (ref 6.5–8.1)

## 2020-10-20 LAB — CBC WITH DIFFERENTIAL/PLATELET
Abs Immature Granulocytes: 0.04 10*3/uL (ref 0.00–0.07)
Basophils Absolute: 0.1 10*3/uL (ref 0.0–0.1)
Basophils Relative: 1 %
Eosinophils Absolute: 0.1 10*3/uL (ref 0.0–0.5)
Eosinophils Relative: 2 %
HCT: 37.8 % (ref 36.0–46.0)
Hemoglobin: 13.2 g/dL (ref 12.0–15.0)
Immature Granulocytes: 1 %
Lymphocytes Relative: 9 %
Lymphs Abs: 0.5 10*3/uL — ABNORMAL LOW (ref 0.7–4.0)
MCH: 30.4 pg (ref 26.0–34.0)
MCHC: 34.9 g/dL (ref 30.0–36.0)
MCV: 87.1 fL (ref 80.0–100.0)
Monocytes Absolute: 0.7 10*3/uL (ref 0.1–1.0)
Monocytes Relative: 13 %
Neutro Abs: 3.8 10*3/uL (ref 1.7–7.7)
Neutrophils Relative %: 74 %
Platelets: 194 10*3/uL (ref 150–400)
RBC: 4.34 MIL/uL (ref 3.87–5.11)
RDW: 12.1 % (ref 11.5–15.5)
WBC: 5.1 10*3/uL (ref 4.0–10.5)
nRBC: 0 % (ref 0.0–0.2)

## 2020-10-20 LAB — LACTATE DEHYDROGENASE: LDH: 205 U/L — ABNORMAL HIGH (ref 98–192)

## 2020-10-20 MED ORDER — SODIUM CHLORIDE 0.9 % IV SOLN
375.0000 mg/m2 | Freq: Once | INTRAVENOUS | Status: AC
  Administered 2020-10-20: 700 mg via INTRAVENOUS
  Filled 2020-10-20: qty 50

## 2020-10-20 MED ORDER — FAMOTIDINE 20 MG IN NS 100 ML IVPB
INTRAVENOUS | Status: AC
  Filled 2020-10-20: qty 100

## 2020-10-20 MED ORDER — SODIUM CHLORIDE 0.9 % IV SOLN
Freq: Once | INTRAVENOUS | Status: AC
  Filled 2020-10-20: qty 250

## 2020-10-20 MED ORDER — ACETAMINOPHEN 325 MG PO TABS
ORAL_TABLET | ORAL | Status: AC
  Filled 2020-10-20: qty 2

## 2020-10-20 MED ORDER — DIPHENHYDRAMINE HCL 25 MG PO CAPS
50.0000 mg | ORAL_CAPSULE | Freq: Once | ORAL | Status: AC
  Administered 2020-10-20: 50 mg via ORAL

## 2020-10-20 MED ORDER — FAMOTIDINE 20 MG IN NS 100 ML IVPB
20.0000 mg | Freq: Once | INTRAVENOUS | Status: AC
  Administered 2020-10-20: 20 mg via INTRAVENOUS

## 2020-10-20 MED ORDER — DIPHENHYDRAMINE HCL 25 MG PO CAPS
ORAL_CAPSULE | ORAL | Status: AC
  Filled 2020-10-20: qty 2

## 2020-10-20 MED ORDER — CILGAVIMAB (PART OF EVUSHELD) INJECTION
300.0000 mg | Freq: Once | INTRAMUSCULAR | Status: AC
  Administered 2020-10-20: 300 mg via INTRAMUSCULAR
  Filled 2020-10-20: qty 3

## 2020-10-20 MED ORDER — TIXAGEVIMAB (PART OF EVUSHELD) INJECTION
300.0000 mg | Freq: Once | INTRAMUSCULAR | Status: AC
  Administered 2020-10-20: 300 mg via INTRAMUSCULAR
  Filled 2020-10-20: qty 3

## 2020-10-20 MED ORDER — SODIUM CHLORIDE 0.9 % IV SOLN
10.0000 mg | Freq: Once | INTRAVENOUS | Status: AC
  Administered 2020-10-20: 10 mg via INTRAVENOUS
  Filled 2020-10-20: qty 10

## 2020-10-20 MED ORDER — ACETAMINOPHEN 325 MG PO TABS
650.0000 mg | ORAL_TABLET | Freq: Once | ORAL | Status: AC
  Administered 2020-10-20: 650 mg via ORAL

## 2020-10-20 NOTE — Assessment & Plan Note (Signed)
02/06/2019 PET/CT scan ([id number]) revealed "1. Bulky hypermetabolic lymphadenopathy in the abdomen and upper pelvis, as described. This is associated with mild hypermetabolic lymphadenopathy in the upper mediastinum and right axilla. Hypermetabolic lymphadenopathy represents a combination of Deauville 4 and Deauville 5 disease. 2. 8 mm nodule in the lingula without hypermetabolism. 03/08/2019 Lymph Node Biopsy: - Follicular lymphoma (grade 1-2 of 3) with elevated proliferation rate."  Prior treatment: Bendamustine Rituxan x5 cycles December 2020-April 2021 ---------------------------------------------------------- Current treatment: Rituxan maintenance, today cycle 7  Toxicities:  Return to clinic every 2 months for Rituxan maintenance therapy.

## 2020-10-20 NOTE — Progress Notes (Signed)
Patient Care Team: [name], MD as PCP - General (Internal Medicine)  DIAGNOSIS:  Encounter Diagnosis  Name Primary?   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  She is tolerating Rituxan reasonably well.  After treatment she feels tired and fatigued.  She also develops nausea for which no nausea medicine appears to be helping her.  She actually stopped taking all nausea medicines.  She denies any enlarged lymph nodes or lumps or nodules of concern.   ALLERGIES:  is allergic to latex, codeine, other, hydrocodone, and oxycodone.  MEDICATIONS:  Current Outpatient Medications  Medication Sig Dispense Refill   acyclovir (ZOVIRAX) 400 MG tablet TAKE 1 TABLET(400 MG) BY MOUTH DAILY 30 tablet 6   aspirin EC 81 MG tablet Take 81 mg by mouth daily.     diphenhydrAMINE (BENADRYL) 25 MG tablet Take 25 mg by mouth at bedtime.     lisinopril-hydrochlorothiazide (ZESTORETIC) 20-12.5 MG tablet Take 1 tablet by mouth daily.     Magnesium Hydroxide (DULCOLAX PO) Take 1 tablet by mouth 2 (two) times daily.     Melatonin 10 MG CAPS Take 10 mg by mouth at bedtime.      meloxicam (MOBIC) 15 MG tablet Take 15 mg by mouth daily.     metFORMIN (GLUCOPHAGE) 500 MG tablet Take 500 mg by mouth daily.     metoprolol succinate (TOPROL-XL) 50 MG 24 hr tablet Take 50 mg by mouth daily.     Multiple  Vitamin (MULTIVITAMIN) tablet Take 1 tablet by mouth daily.     rosuvastatin (CRESTOR) 10 MG tablet Take 10 mg by mouth daily.     venlafaxine (EFFEXOR) 37.5 MG tablet Take 37.5 mg by mouth once.     No current facility-administered medications for this visit.    PHYSICAL EXAMINATION: ECOG PERFORMANCE STATUS: 1 - Symptomatic but completely ambulatory  Vitals:   10/20/20 1003  BP: 139/70  Pulse: 99  Resp: 18  Temp: 97.7 F (36.5 C)  SpO2: 99%   Filed Weights   10/20/20 1003  Weight: 193 lb 12.8 oz (87.9 kg)      LABORATORY DATA:  I have reviewed the data as listed CMP Latest Ref Rng & Units 10/20/2020 08/06/2020 06/10/2020  Glucose 70 - 99 mg/dL 231(H) 237(H) 237(H)  BUN 8 - 23 mg/dL 16 14 19   Creatinine 0.44 - 1.00 mg/dL 1.15(H) 1.05(H) 1.13(H)  Sodium 135 - 145 mmol/L 139 141 140  Potassium 3.5 - 5.1 mmol/L 3.8 3.8 3.8  Chloride 98 - 111 mmol/L 101 99 101  CO2 22 - 32 mmol/L 24 25 24   Calcium 8.9 - 10.3 mg/dL 9.6 9.9 9.3  Total Protein 6.5 - 8.1 g/dL 7.0 7.1 7.2  Total Bilirubin 0.3 - 1.2 mg/dL 0.4 0.4 0.4  Alkaline Phos 38 - 126 U/L 55 66 66  AST 15 - 41 U/L 34 30 28  ALT  0 - 44 U/L 53(H) 49(H) 47(H)    Lab Results  Component Value Date   WBC 5.1 10/20/2020   HGB 13.2 10/20/2020   HCT 37.8 10/20/2020   MCV 87.1 10/20/2020   PLT 194 10/20/2020   NEUTROABS 3.8 10/20/2020    ASSESSMENT & PLAN:  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 Bulky hypermetabolic lymphadenopathy in the abdomen and upper pelvis, as described. This is associated with mild hypermetabolic lymphadenopathy in the upper mediastinum and right axilla. Hypermetabolic lymphadenopathy represents a combination of Deauville 4 and Deauville 5 disease. 2. 8 mm nodule in the lingula without hypermetabolism. 03/08/2019 Lymph Node Biopsy: - Follicular lymphoma (grade 1-2 of 3) with elevated proliferation rate."  Prior treatment:  Bendamustine Rituxan x5 cycles December 2020-April 2021 ---------------------------------------------------------- Current treatment: Rituxan maintenance, today cycle 7  Toxicities: Fatigue, nausea [place] recommendation: We will try to see if it can be administered today.  Return to clinic every 2 months for Rituxan maintenance therapy.   No orders of the defined types were placed in this encounter.  The patient has a good understanding of the overall plan. she agrees with it. she will call with any problems that may develop before the next visit here. Total time spent: 30 mins including face to face time and time spent for planning, charting and co-ordination of care   Harriette Ohara, MD 10/20/20

## 2020-10-20 NOTE — Telephone Encounter (Signed)
Scheduled appointment per 06/28 los. Patient is aware.

## 2020-10-20 NOTE — Progress Notes (Signed)
Patient observed 30 min post injection for Evusheld.  Patient discharged with no distress. VS WNL.

## 2020-12-02 ENCOUNTER — Telehealth: Payer: Self-pay | Admitting: Hematology

## 2020-12-02 NOTE — Telephone Encounter (Signed)
Left message with rescheduled upcoming appointment per treatment plan.

## 2020-12-08 ENCOUNTER — Ambulatory Visit: Payer: Medicare Other

## 2020-12-08 ENCOUNTER — Ambulatory Visit: Payer: Medicare Other | Admitting: Hematology and Oncology

## 2020-12-08 ENCOUNTER — Other Ambulatory Visit: Payer: Medicare Other

## 2020-12-18 ENCOUNTER — Inpatient Hospital Stay: Payer: Medicare Other

## 2020-12-18 ENCOUNTER — Inpatient Hospital Stay: Payer: Medicare Other | Admitting: Hematology

## 2020-12-18 ENCOUNTER — Other Ambulatory Visit: Payer: Self-pay

## 2020-12-18 ENCOUNTER — Inpatient Hospital Stay: Payer: Medicare Other | Attending: Hematology

## 2020-12-18 VITALS — BP 135/74 | HR 94 | Temp 98.7°F | Resp 18 | Wt 191.2 lb

## 2020-12-18 VITALS — BP 129/70 | HR 92 | Temp 98.0°F | Resp 18

## 2020-12-18 DIAGNOSIS — Z8379 Family history of other diseases of the digestive system: Secondary | ICD-10-CM | POA: Insufficient documentation

## 2020-12-18 DIAGNOSIS — Z801 Family history of malignant neoplasm of trachea, bronchus and lung: Secondary | ICD-10-CM | POA: Diagnosis not present

## 2020-12-18 DIAGNOSIS — Z803 Family history of malignant neoplasm of breast: Secondary | ICD-10-CM | POA: Diagnosis not present

## 2020-12-18 DIAGNOSIS — C8218 Follicular lymphoma grade II, lymph nodes of multiple sites: Secondary | ICD-10-CM

## 2020-12-18 DIAGNOSIS — H04129 Dry eye syndrome of unspecified lacrimal gland: Secondary | ICD-10-CM | POA: Insufficient documentation

## 2020-12-18 DIAGNOSIS — M549 Dorsalgia, unspecified: Secondary | ICD-10-CM | POA: Insufficient documentation

## 2020-12-18 DIAGNOSIS — R519 Headache, unspecified: Secondary | ICD-10-CM | POA: Insufficient documentation

## 2020-12-18 DIAGNOSIS — Z5112 Encounter for antineoplastic immunotherapy: Secondary | ICD-10-CM

## 2020-12-18 DIAGNOSIS — R61 Generalized hyperhidrosis: Secondary | ICD-10-CM | POA: Insufficient documentation

## 2020-12-18 DIAGNOSIS — Z79899 Other long term (current) drug therapy: Secondary | ICD-10-CM | POA: Insufficient documentation

## 2020-12-18 DIAGNOSIS — Z7189 Other specified counseling: Secondary | ICD-10-CM

## 2020-12-18 LAB — CMP (CANCER CENTER ONLY)
ALT: 56 U/L — ABNORMAL HIGH (ref 0–44)
AST: 34 U/L (ref 15–41)
Albumin: 4.4 g/dL (ref 3.5–5.0)
Alkaline Phosphatase: 73 U/L (ref 38–126)
Anion gap: 14 (ref 5–15)
BUN: 12 mg/dL (ref 8–23)
CO2: 27 mmol/L (ref 22–32)
Calcium: 9.8 mg/dL (ref 8.9–10.3)
Chloride: 98 mmol/L (ref 98–111)
Creatinine: 1.19 mg/dL — ABNORMAL HIGH (ref 0.44–1.00)
GFR, Estimated: 50 mL/min — ABNORMAL LOW (ref >60)
Glucose, Bld: 260 mg/dL — ABNORMAL HIGH (ref 70–99)
Potassium: 4.1 mmol/L (ref 3.5–5.1)
Sodium: 139 mmol/L (ref 135–145)
Total Bilirubin: 0.4 mg/dL (ref 0.3–1.2)
Total Protein: 7.3 g/dL (ref 6.5–8.1)

## 2020-12-18 LAB — CBC WITH DIFFERENTIAL (CANCER CENTER ONLY)
Abs Immature Granulocytes: 0.04 10*3/uL (ref 0.00–0.07)
Basophils Absolute: 0.1 10*3/uL (ref 0.0–0.1)
Basophils Relative: 1 %
Eosinophils Absolute: 0.1 10*3/uL (ref 0.0–0.5)
Eosinophils Relative: 2 %
HCT: 39.7 % (ref 36.0–46.0)
Hemoglobin: 13.8 g/dL (ref 12.0–15.0)
Immature Granulocytes: 1 %
Lymphocytes Relative: 8 %
Lymphs Abs: 0.5 10*3/uL — ABNORMAL LOW (ref 0.7–4.0)
MCH: 30 pg (ref 26.0–34.0)
MCHC: 34.8 g/dL (ref 30.0–36.0)
MCV: 86.3 fL (ref 80.0–100.0)
Monocytes Absolute: 0.8 10*3/uL (ref 0.1–1.0)
Monocytes Relative: 13 %
Neutro Abs: 4.8 10*3/uL (ref 1.7–7.7)
Neutrophils Relative %: 75 %
Platelet Count: 210 10*3/uL (ref 150–400)
RBC: 4.6 MIL/uL (ref 3.87–5.11)
RDW: 12.5 % (ref 11.5–15.5)
WBC Count: 6.4 10*3/uL (ref 4.0–10.5)
nRBC: 0 % (ref 0.0–0.2)

## 2020-12-18 LAB — LACTATE DEHYDROGENASE: LDH: 216 U/L — ABNORMAL HIGH (ref 98–192)

## 2020-12-18 MED ORDER — FAMOTIDINE 20 MG IN NS 100 ML IVPB
20.0000 mg | Freq: Once | INTRAVENOUS | Status: AC
  Administered 2020-12-18: 20 mg via INTRAVENOUS
  Filled 2020-12-18: qty 100

## 2020-12-18 MED ORDER — SODIUM CHLORIDE 0.9 % IV SOLN: Freq: Once | INTRAVENOUS | Status: AC

## 2020-12-18 MED ORDER — DIPHENHYDRAMINE HCL 25 MG PO CAPS
50.0000 mg | ORAL_CAPSULE | Freq: Once | ORAL | Status: AC
  Administered 2020-12-18: 50 mg via ORAL
  Filled 2020-12-18: qty 2

## 2020-12-18 MED ORDER — SODIUM CHLORIDE 0.9 % IV SOLN
10.0000 mg | Freq: Once | INTRAVENOUS | Status: AC
  Administered 2020-12-18: 10 mg via INTRAVENOUS
  Filled 2020-12-18: qty 10

## 2020-12-18 MED ORDER — SODIUM CHLORIDE 0.9 % IV SOLN
375.0000 mg/m2 | Freq: Once | INTRAVENOUS | Status: AC
  Administered 2020-12-18: 700 mg via INTRAVENOUS
  Filled 2020-12-18: qty 50

## 2020-12-18 MED ORDER — ACETAMINOPHEN 325 MG PO TABS
650.0000 mg | ORAL_TABLET | Freq: Once | ORAL | Status: AC
  Administered 2020-12-18: 650 mg via ORAL
  Filled 2020-12-18: qty 2

## 2020-12-18 NOTE — Patient Instructions (Addendum)
[facility]   Discharge Instructions: Thank you for choosing [facility]  to provide your oncology and hematology care.   If you have a lab appointment with the [facility], please go directly to the [facility] and check in at the registration area.   Wear comfortable clothing and clothing appropriate for easy access to any Portacath or PICC line.   We strive to give you quality time with your provider. You may need to reschedule your appointment if you arrive late (15 or more minutes).  Arriving late affects you and other patients whose appointments are after yours.  Also, if you miss three or more appointments without notifying the office, you may be dismissed from the clinic at the provider's discretion.      For prescription refill requests, have your pharmacy contact our office and allow 72 hours for refills to be completed.    Today you received the following chemotherapy and/or immunotherapy agents: rituximab-pvvr.      To help prevent nausea and vomiting after your treatment, we encourage you to take your nausea medication as directed.  BELOW ARE SYMPTOMS THAT SHOULD BE REPORTED IMMEDIATELY: *FEVER GREATER THAN 100.4 F (38 C) OR HIGHER *CHILLS OR SWEATING *NAUSEA AND VOMITING THAT IS NOT CONTROLLED WITH YOUR NAUSEA MEDICATION *UNUSUAL SHORTNESS OF BREATH *UNUSUAL BRUISING OR BLEEDING *URINARY PROBLEMS (pain or burning when urinating, or frequent urination) *BOWEL PROBLEMS (unusual diarrhea, constipation, pain near the anus) TENDERNESS IN MOUTH AND THROAT WITH OR WITHOUT PRESENCE OF ULCERS (sore throat, sores in mouth, or a toothache) UNUSUAL RASH, SWELLING OR PAIN  UNUSUAL VAGINAL DISCHARGE OR ITCHING   Items with * indicate a potential emergency and should be followed up as soon as possible or go to the Emergency Department if any problems should occur.  Please show the CHEMOTHERAPY ALERT CARD or IMMUNOTHERAPY ALERT CARD at  check-in to the Emergency Department and triage nurse.  Should you have questions after your visit or need to cancel or reschedule your appointment, please contact [facility]  Dept: [phone]  and follow the prompts.  Office hours are 8:00 a.m. to 4:30 p.m. Monday - Friday. Please note that voicemails left after 4:00 p.m. may not be returned until the following business day.  We are closed weekends and major holidays. You have access to a nurse at all times for urgent questions. Please call the main number to the clinic Dept: [phone] and follow the prompts.   For any non-urgent questions, you may also contact your provider using MyChart. We now offer e-Visits for anyone 55 and older to request care online for non-urgent symptoms. For details visit [email or web address].   Also download the MyChart app! Go to the app store, search "MyChart", open the app, select [facility], and log in with your MyChart username and password.  Due to Covid, a mask is required upon entering the hospital/clinic. If you do not have a mask, one will be given to you upon arrival. For doctor visits, patients may have 1 support person aged 39 or older with them. For treatment visits, patients cannot have anyone with them due to current Covid guidelines and our immunocompromised population.

## 2020-12-18 NOTE — Progress Notes (Signed)
HEMATOLOGY/ONCOLOGY CLINIC NOTE  Date of Service: 12/18/2020  Patient Care Team: [name], MD as PCP - General (Internal Medicine)  CHIEF COMPLAINTS/PURPOSE OF CONSULTATION:  F/u for continued mx of follicular lymphoma  HISTORY OF PRESENTING ILLNESS:   Meghan Alexander is a wonderful 65 y.o. female who has been referred to [place] by Dr Layne Benton for evaluation and management of possible lymphoproliferative disease.The pt reports that she is doing well overall.  The pt reports that she had an MRI earlier this month because of her back pain. She has had back pain for about 30 years, but in May, it got so bad that she could not move. The pain radiated around her hip and down to her knee. She could not even get in her car to go to the ER. She put herself on bedrest for 5 weeks which improved her symptoms. Now, her back pain is back to baseline.  Pt reported that, in the past 6 months, she has been moving her bowels 3-4x daily, which is much more than normal for her. Throughout the day, her stools get progressively looser. She went to the ER on 10/11/2018 for food poisoning from mayonnaise because she had blood in the stool and cramping. The symptoms began 2 hours after she ate the mayo. She was not placed on antibiotics or any other medications. No stool testing was done. Her symptoms resolved in 2 days.  She experienced night sweats up until 2006, and they returned 6 months ago. She does not take hormone replacements because of her FHx of breast cancer. She also reports some new fatigue, dry eyes, and headaches over the last 6 months. Denies unexpected weight loss. She has not been diagnosed with diabetes.  Of note prior to the patient's visit today, pt has had MRI lumbar spine wo contrast completed on 11/02/2018 with results revealing "Extensive retroperitoneal lymphadenopathy highly worrisome for neoplastic process such as lymphoproliferative disease. 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 There is mild central canal narrowing overall at this Level."  Most recent lab results (10/11/2018) of CBC is as follows: all values are WNL except for Potassium at 3.3, glucose bld at 212. 10/11/2018 Urinalysis revealed hazy appearance 10/11/2018 Lipase at 29  On review of systems, pt reports back pain, 3-4 bowel movements daily, dry eyes, headaches, night sweats, and denies unexpected weight loss, mouth sores, belly pain, changes in urination trouble swallowing, fever, chills, and any other symptoms.   On PMHx the pt reports total hysterectomy at 38 years ago due to ovarian cysts, lymphoreticulosis, sleep apnea related surgery, breast augmentation with no removal of breast tissue On Social Hx the pt reports never smoking On Family Hx the pt reports breast cancer on both sides of the family.   INTERVAL HISTORY  Meghan Alexander is a 65 y.o. female presenting today for the management and evaluation of her follicular lymphoma and next dose of maintenance Rituxan.  Patient was last seen in clinic on 10/20/2020 by Dr. Lindi Adie in my absence. She received her Evusheld injection at her last visit and tolerated this without any issues.  Patient reports that she had black stools on 2 occasions about a month or so ago.  Her aspirin has been stopped and she was started on omeprazole twice daily.  She has been evaluated by Dr. Outlaw/gastroenterology and is  to be scheduled for an EGD and colonoscopy. She notes that her abdominal pain that she previously had has resolved. Has chronic nausea that has been attributed to her achalasia.  Lab results today reviewed with the patient.  On review of systems, pt reports no fevers no chills no night sweats no new lumps or bumps.  No current active GI bleeding.  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 ([place]) 04/02/2019   GERD (gastroesophageal reflux disease)    hx of reflux-went away with elevating HOB    Hypertension    Pneumonia    hx of    PONV (postoperative nausea and vomiting)     SURGICAL HISTORY: Past Surgical History:  Procedure Laterality Date   ABDOMINAL HYSTERECTOMY     ADENOIDECTOMY     cosmetic surgeries     ESOPHAGEAL MANOMETRY N/A 12/02/2015   Procedure: ESOPHAGEAL MANOMETRY (EM);  Surgeon: [name], MD;  Location: [facility];  Service: Endoscopy;  Laterality: N/A;   ESOPHAGOGASTRODUODENOSCOPY N/A 12/02/2015   Procedure: ESOPHAGOGASTRODUODENOSCOPY (EGD);  Surgeon: [name], MD;  Location: [facility];  Service: Endoscopy;  Laterality: N/A;   lymph node removal left leg     cat scratch surgery    THROAT SURGERY     sleep apnea surgery    TONSILLECTOMY      SOCIAL HISTORY: Social History   Socioeconomic History   Marital status: Married    Spouse name: Not on file   Number of children: Not on file   Years of education: Not on file   Highest education level: Not on file  Occupational History   Not on file  Tobacco Use   Smoking status: Never   Smokeless tobacco: Never  Vaping Use   Vaping Use: Never used  Substance and Sexual Activity   Alcohol use: No    Alcohol/week: 0.0 standard drinks   Drug use: No   Sexual activity: Not on file  Other Topics Concern   Not on file  Social History Narrative   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 [employer]   Never smoker never drinker   No drugs   Multiple allergies   Lives at home with her husband of 8 years since 2008   Social Determinants of Health   Financial Resource Strain: Not on file  Food Insecurity: Not on file   Transportation Needs: Not on file  Physical Activity: Not on file  Stress: Not on file  Social Connections: Not on file  Intimate Partner Violence: Not on file    FAMILY HISTORY: Family History  Problem Relation Age of Onset   Lung cancer Father        1982 died from it   Hernia Mother    Breast cancer Maternal Aunt        multiple    Breast cancer Maternal Grandmother    Breast cancer Paternal Grandmother    Breast cancer Cousin        cousins multiple    Colon cancer Neg Hx    Colon polyps Neg Hx    Rectal cancer Neg Hx    Stomach cancer Neg Hx     ALLERGIES:  is allergic to latex, codeine, other,  hydrocodone, and oxycodone.  MEDICATIONS:  Current Outpatient Medications  Medication Sig Dispense Refill   acyclovir (ZOVIRAX) 400 MG tablet TAKE 1 TABLET(400 MG) BY MOUTH DAILY 30 tablet 6   amLODipine (NORVASC) 10 MG tablet Take 10 mg by mouth daily.     diphenhydrAMINE (BENADRYL) 25 MG tablet Take 25 mg by mouth at bedtime.     losartan-hydrochlorothiazide (HYZAAR) 50-12.5 MG tablet Take 1 tablet by mouth daily.     Melatonin 10 MG CAPS Take 10 mg by mouth at bedtime.      meloxicam (MOBIC) 15 MG tablet Take 15 mg by mouth daily.     metFORMIN (GLUCOPHAGE) 500 MG tablet Take 500 mg by mouth daily.     metoprolol succinate (TOPROL-XL) 50 MG 24 hr tablet Take 50 mg by mouth daily.     Multiple Vitamin (MULTIVITAMIN) tablet Take 1 tablet by mouth daily.     omeprazole (PRILOSEC) 20 MG capsule Take 20 mg by mouth 2 (two) times daily.     rosuvastatin (CRESTOR) 10 MG tablet Take 10 mg by mouth daily.     venlafaxine (EFFEXOR) 37.5 MG tablet Take 37.5 mg by mouth once.     aspirin EC 81 MG tablet Take 81 mg by mouth daily.     lisinopril-hydrochlorothiazide (ZESTORETIC) 20-12.5 MG tablet Take 1 tablet by mouth daily.     Magnesium Hydroxide (DULCOLAX PO) Take 1 tablet by mouth 2 (two) times daily.     No current facility-administered medications for this visit.    REVIEW  OF SYSTEMS: .10 Point review of Systems was done is negative except as noted above.   PHYSICAL EXAMINATION: ECOG FS:1 - Symptomatic but completely ambulatory  Vitals:   12/18/20 1010  BP: 135/74  Pulse: 94  Resp: 18  Temp: 98.7 F (37.1 C)  SpO2: 98%   Wt Readings from Last 3 Encounters:  12/18/20 191 lb 3.2 oz (86.7 kg)  10/20/20 193 lb 12.8 oz (87.9 kg)  08/06/20 195 lb (88.5 kg)   Body mass index is 34.97 kg/m.  NAD . GENERAL:alert, in no acute distress and comfortable SKIN: no acute rashes, no significant lesions EYES: conjunctiva are pink and non-injected, sclera anicteric OROPHARYNX: MMM, no exudates, no oropharyngeal erythema or ulceration NECK: supple, no JVD LYMPH:  no palpable lymphadenopathy in the cervical, axillary or inguinal regions LUNGS: clear to auscultation b/l with normal respiratory effort HEART: regular rate & rhythm ABDOMEN:  normoactive bowel sounds , non tender, not distended. Extremity: no pedal edema PSYCH: alert & oriented x 3 with fluent speech NEURO: no focal motor/sensory deficits  LABORATORY DATA:  I have reviewed the data as listed  CBC Latest Ref Rng & Units 12/18/2020 10/20/2020 08/06/2020  WBC 4.0 - 10.5 K/uL 6.4 5.1 4.1  Hemoglobin 12.0 - 15.0 g/dL 13.8 13.2 13.4  Hematocrit 36.0 - 46.0 % 39.7 37.8 38.1  Platelets 150 - 400 K/uL 210 194 223    CMP Latest Ref Rng & Units 12/18/2020 10/20/2020 08/06/2020  Glucose 70 - 99 mg/dL 260(H) 231(H) 237(H)  BUN 8 - 23 mg/dL '12 16 14  '$ Creatinine 0.44 - 1.00 mg/dL 1.19(H) 1.15(H) 1.05(H)  Sodium 135 - 145 mmol/L 139 139 141  Potassium 3.5 - 5.1 mmol/L 4.1 3.8 3.8  Chloride 98 - 111 mmol/L 98 101 99  CO2 22 - 32 mmol/L '27 24 25  '$ Calcium 8.9 - 10.3 mg/dL 9.8 9.6 9.9  Total Protein 6.5 - 8.1 g/dL 7.3 7.0 7.1  Total Bilirubin 0.3 -  1.2 mg/dL 0.4 0.4 0.4  Alkaline Phos 38 - 126 U/L 73 55 66  AST 15 - 41 U/L 34 34 30  ALT 0 - 44 U/L 56(H) 53(H) 49(H)   . Lab Results  Component Value Date    LDH 216 (H) 12/18/2020   05/14/2019 CT ANGIO CHEST PE W OR WO CONTRAST (Accession [id number]):   03/08/2019 Lymph Node Biopsy [id number]):      RADIOGRAPHIC STUDIES: I have personally reviewed the radiological images as listed and agreed with the findings in the report. No results found.  ASSESSMENT & PLAN:   #1 Stage III/IV follicular lymphoma - likely low grade but accurate grading difficult  11/02/2018 MRI lumbar spine wo contrast revealed "Extensive retroperitoneal lymphadenopathy highly worrisome for neoplastic process such as lymphoproliferative disease. Spondylosis worst at L4-5 where there is narrowing in the right subarticular recess predominantly due to a synovial cyst off the medial margin of the right facet with encroachment on the descending right L5 root. There is mild central canal narrowing overall at this Level."  12/04/2018 CT CAP revealed "Mild-to-moderate abdominal and pelvic lymphadenopathy and new 9 mm left superior mediastinal lymph node, highly suspicious for lymphoproliferative disorder. Stable 8 mm lingular pulmonary nodule, consistent with benign Etiology. Moderate hepatic steatosis. 1.3 cm indeterminate low-attenuation lesion in the right lobe. Recommend continued attention on follow-up CT."  12/18/2018 lymph node biopsy pathology revealing a follicular lymphoma- likely low grade but accurate grading not possible on limited sampling.  02/06/2019 PET/CT scan ([id number]) revealed "1. Bulky hypermetabolic lymphadenopathy in the abdomen and upper pelvis, as described. This is associated with mild hypermetabolic lymphadenopathy in the upper mediastinum and right axilla. Hypermetabolic lymphadenopathy represents a combination of Deauville 4 and Deauville 5 disease. 2. 8 mm nodule in the lingula without hypermetabolism. Attention on follow-up recommended. 3. Hepatic steatosis. 4.  Aortic Atherosclerois (ICD10-170.0)."  03/08/2019 Lymph Node Biopsy ([id number])  revealed "LYMPH NODE, NEEDLE CORE BIOPSY: - Follicular lymphoma (grade 1-2 of 3) with elevated proliferation rate."  2. Mild treatment related thrombocytopenia- resolved.  PLAN: -Discussed pt labwork today, blood counts are normal.  Blood chemistries are stable other than some hyperglycemia with blood sugars around 260 and borderline elevation in ALT -No lab or clinical evidence of Follicular Lymphoma recurrence/progression at this time.  -The pt has no prohibitive toxicities from continuing maintenance Rituxan every two months. -Continue 2000 IU Vitamin D daily. -Recommended pt take Multivitamin daily and Vitamin B12.  -continue Vitamin B-Complex daily. -Discussed with patient importance of taking flu shot when available next month and also the new COVID 19 booster when that comes out within the next month or so.  3.  Melena.  2 episodes about a month ago.  Hemoglobin stable today. Previously had upper abdominal discomfort which has resolved. Has some chronic nausea due to achalasia. PLAN -Follows with Dr. Paulita Fujita gastroenterology-patient notes she is being scheduled for an EGD  and colonoscopy. -No contraindication for EGD or colonoscopy from oncology standpoint. Had been on aspirin which has been stopped. Is currently on meloxicam and venlafaxine the combination of which can increase the risk of GI bleeding-I recommended discussing this with her primary care physician and gastroenterologist. -Is on PPI twice daily.  4.  Chronic ALT elevation -likely from her fatty liver. Plan -Follow-up with PCP and GI for management of hepatic steatosis/steatohepatitis. -Avoid hepatotoxic medications. -Recommended optimal diet and exercise and to optimize control of diabetes with PCP.  FOLLOW UP:  Please schedule next 3 doses of maintenance Rituxan every 2  months with port flush, labs and MD visits Follow-up with PCP and GI  . The total time spent in the appointment was 30 minutes and more than  50% was on counseling and direct patient cares, ordering and management of immunotherapy.  All of the patient's questions were answered with apparent satisfaction. The patient knows to call the clinic with any problems, questions or concerns.   Sullivan Lone MD [place] AAHIVMS [facility] Hematology/Oncology Physician [facility]  (Office):       [phone] (Work cell):  [phone] (Fax):           [phone]

## 2020-12-21 ENCOUNTER — Other Ambulatory Visit: Payer: Self-pay | Admitting: Hematology

## 2020-12-21 ENCOUNTER — Telehealth: Payer: Self-pay | Admitting: Hematology

## 2020-12-21 NOTE — Telephone Encounter (Signed)
Scheduled follow-up appointments per 8/26 los. Patient is aware. 

## 2020-12-30 LAB — HM DIABETES EYE EXAM

## 2021-01-25 ENCOUNTER — Encounter: Payer: Self-pay | Admitting: *Deleted

## 2021-01-25 ENCOUNTER — Telehealth: Payer: Self-pay | Admitting: *Deleted

## 2021-01-25 NOTE — Telephone Encounter (Signed)
Meghan Alexander left a message stating she has been having trouble breathing and has been coughing for several hdays. Has tested negative twice for covid. Has a history of pneumonia X 5. States she is getting worse. Wants to know if Dr Irene Limbo can send in anything or does she need to be seen?

## 2021-01-25 NOTE — Telephone Encounter (Signed)
Spoke with patient. She is going to call PCP who has treated her pneumonia.

## 2021-02-16 ENCOUNTER — Ambulatory Visit: Payer: Medicare Other

## 2021-02-16 ENCOUNTER — Other Ambulatory Visit: Payer: Medicare Other

## 2021-02-16 ENCOUNTER — Ambulatory Visit: Payer: Medicare Other | Admitting: Hematology

## 2021-02-23 ENCOUNTER — Ambulatory Visit: Payer: Medicare Other | Admitting: Hematology

## 2021-02-23 ENCOUNTER — Ambulatory Visit: Payer: Medicare Other

## 2021-02-23 ENCOUNTER — Other Ambulatory Visit: Payer: Medicare Other

## 2021-02-24 ENCOUNTER — Other Ambulatory Visit: Payer: Self-pay

## 2021-02-24 DIAGNOSIS — C8218 Follicular lymphoma grade II, lymph nodes of multiple sites: Secondary | ICD-10-CM

## 2021-02-25 ENCOUNTER — Inpatient Hospital Stay: Payer: Medicare Other

## 2021-02-25 ENCOUNTER — Other Ambulatory Visit: Payer: Self-pay

## 2021-02-25 ENCOUNTER — Inpatient Hospital Stay: Payer: Medicare Other | Attending: Hematology | Admitting: Hematology

## 2021-02-25 VITALS — BP 133/83 | HR 101 | Temp 97.0°F | Resp 20 | Wt 190.6 lb

## 2021-02-25 VITALS — BP 122/72 | HR 88 | Temp 98.4°F | Resp 18

## 2021-02-25 DIAGNOSIS — M549 Dorsalgia, unspecified: Secondary | ICD-10-CM | POA: Insufficient documentation

## 2021-02-25 DIAGNOSIS — E119 Type 2 diabetes mellitus without complications: Secondary | ICD-10-CM | POA: Diagnosis not present

## 2021-02-25 DIAGNOSIS — R519 Headache, unspecified: Secondary | ICD-10-CM | POA: Diagnosis not present

## 2021-02-25 DIAGNOSIS — C8218 Follicular lymphoma grade II, lymph nodes of multiple sites: Secondary | ICD-10-CM

## 2021-02-25 DIAGNOSIS — Z5112 Encounter for antineoplastic immunotherapy: Secondary | ICD-10-CM | POA: Diagnosis present

## 2021-02-25 DIAGNOSIS — H04123 Dry eye syndrome of bilateral lacrimal glands: Secondary | ICD-10-CM | POA: Diagnosis not present

## 2021-02-25 DIAGNOSIS — Z8379 Family history of other diseases of the digestive system: Secondary | ICD-10-CM | POA: Diagnosis not present

## 2021-02-25 DIAGNOSIS — Z803 Family history of malignant neoplasm of breast: Secondary | ICD-10-CM | POA: Insufficient documentation

## 2021-02-25 DIAGNOSIS — G4730 Sleep apnea, unspecified: Secondary | ICD-10-CM | POA: Insufficient documentation

## 2021-02-25 DIAGNOSIS — Z801 Family history of malignant neoplasm of trachea, bronchus and lung: Secondary | ICD-10-CM | POA: Diagnosis not present

## 2021-02-25 DIAGNOSIS — R61 Generalized hyperhidrosis: Secondary | ICD-10-CM | POA: Insufficient documentation

## 2021-02-25 DIAGNOSIS — Z7189 Other specified counseling: Secondary | ICD-10-CM

## 2021-02-25 LAB — CBC WITH DIFFERENTIAL (CANCER CENTER ONLY)
Abs Immature Granulocytes: 0.04 10*3/uL (ref 0.00–0.07)
Basophils Absolute: 0.1 10*3/uL (ref 0.0–0.1)
Basophils Relative: 1 %
Eosinophils Absolute: 0.2 10*3/uL (ref 0.0–0.5)
Eosinophils Relative: 3 %
HCT: 37.7 % (ref 36.0–46.0)
Hemoglobin: 13.2 g/dL (ref 12.0–15.0)
Immature Granulocytes: 1 %
Lymphocytes Relative: 10 %
Lymphs Abs: 0.6 10*3/uL — ABNORMAL LOW (ref 0.7–4.0)
MCH: 30.3 pg (ref 26.0–34.0)
MCHC: 35 g/dL (ref 30.0–36.0)
MCV: 86.7 fL (ref 80.0–100.0)
Monocytes Absolute: 0.8 10*3/uL (ref 0.1–1.0)
Monocytes Relative: 13 %
Neutro Abs: 4.6 10*3/uL (ref 1.7–7.7)
Neutrophils Relative %: 72 %
Platelet Count: 208 10*3/uL (ref 150–400)
RBC: 4.35 MIL/uL (ref 3.87–5.11)
RDW: 12.3 % (ref 11.5–15.5)
WBC Count: 6.2 10*3/uL (ref 4.0–10.5)
nRBC: 0 % (ref 0.0–0.2)

## 2021-02-25 LAB — CMP (CANCER CENTER ONLY)
ALT: 43 U/L (ref 0–44)
AST: 36 U/L (ref 15–41)
Albumin: 4.3 g/dL (ref 3.5–5.0)
Alkaline Phosphatase: 75 U/L (ref 38–126)
Anion gap: 16 — ABNORMAL HIGH (ref 5–15)
BUN: 15 mg/dL (ref 8–23)
CO2: 25 mmol/L (ref 22–32)
Calcium: 9.4 mg/dL (ref 8.9–10.3)
Chloride: 98 mmol/L (ref 98–111)
Creatinine: 0.96 mg/dL (ref 0.44–1.00)
GFR, Estimated: >60 mL/min (ref >60)
Glucose, Bld: 232 mg/dL — ABNORMAL HIGH (ref 70–99)
Potassium: 3.7 mmol/L (ref 3.5–5.1)
Sodium: 139 mmol/L (ref 135–145)
Total Bilirubin: 0.5 mg/dL (ref 0.3–1.2)
Total Protein: 7.1 g/dL (ref 6.5–8.1)

## 2021-02-25 LAB — LACTATE DEHYDROGENASE: LDH: 229 U/L — ABNORMAL HIGH (ref 98–192)

## 2021-02-25 MED ORDER — FAMOTIDINE 20 MG IN NS 100 ML IVPB
20.0000 mg | Freq: Once | INTRAVENOUS | Status: AC
  Administered 2021-02-25: 20 mg via INTRAVENOUS
  Filled 2021-02-25: qty 100

## 2021-02-25 MED ORDER — ACETAMINOPHEN 325 MG PO TABS
650.0000 mg | ORAL_TABLET | Freq: Once | ORAL | Status: AC
  Administered 2021-02-25: 650 mg via ORAL
  Filled 2021-02-25: qty 2

## 2021-02-25 MED ORDER — DIPHENHYDRAMINE HCL 25 MG PO CAPS
50.0000 mg | ORAL_CAPSULE | Freq: Once | ORAL | Status: AC
  Administered 2021-02-25: 50 mg via ORAL
  Filled 2021-02-25: qty 2

## 2021-02-25 MED ORDER — SODIUM CHLORIDE 0.9 % IV SOLN: Freq: Once | INTRAVENOUS | Status: AC

## 2021-02-25 MED ORDER — SODIUM CHLORIDE 0.9 % IV SOLN
10.0000 mg | Freq: Once | INTRAVENOUS | Status: AC
  Administered 2021-02-25: 10 mg via INTRAVENOUS
  Filled 2021-02-25: qty 10

## 2021-02-25 MED ORDER — SODIUM CHLORIDE 0.9 % IV SOLN
375.0000 mg/m2 | Freq: Once | INTRAVENOUS | Status: AC
  Administered 2021-02-25: 700 mg via INTRAVENOUS
  Filled 2021-02-25: qty 50

## 2021-02-25 NOTE — Patient Instructions (Signed)
[facility]  Discharge Instructions: Thank you for choosing [facility] to provide your oncology and hematology care.   If you have a lab appointment with the [facility], please go directly to the [facility] and check in at the registration area.   Wear comfortable clothing and clothing appropriate for easy access to any Portacath or PICC line.   We strive to give you quality time with your provider. You may need to reschedule your appointment if you arrive late (15 or more minutes).  Arriving late affects you and other patients whose appointments are after yours.  Also, if you miss three or more appointments without notifying the office, you may be dismissed from the clinic at the provider's discretion.      For prescription refill requests, have your pharmacy contact our office and allow 72 hours for refills to be completed.    Today you received the following chemotherapy and/or immunotherapy agent: Rituxumab   To help prevent nausea and vomiting after your treatment, we encourage you to take your nausea medication as directed.  BELOW ARE SYMPTOMS THAT SHOULD BE REPORTED IMMEDIATELY: *FEVER GREATER THAN 100.4 F (38 C) OR HIGHER *CHILLS OR SWEATING *NAUSEA AND VOMITING THAT IS NOT CONTROLLED WITH YOUR NAUSEA MEDICATION *UNUSUAL SHORTNESS OF BREATH *UNUSUAL BRUISING OR BLEEDING *URINARY PROBLEMS (pain or burning when urinating, or frequent urination) *BOWEL PROBLEMS (unusual diarrhea, constipation, pain near the anus) TENDERNESS IN MOUTH AND THROAT WITH OR WITHOUT PRESENCE OF ULCERS (sore throat, sores in mouth, or a toothache) UNUSUAL RASH, SWELLING OR PAIN  UNUSUAL VAGINAL DISCHARGE OR ITCHING   Items with * indicate a potential emergency and should be followed up as soon as possible or go to the Emergency Department if any problems should occur.  Please show the CHEMOTHERAPY ALERT CARD or IMMUNOTHERAPY ALERT CARD at check-in to the  Emergency Department and triage nurse.  Should you have questions after your visit or need to cancel or reschedule your appointment, please contact [facility]  Dept: [phone]  and follow the prompts.  Office hours are 8:00 a.m. to 4:30 p.m. Monday - Friday. Please note that voicemails left after 4:00 p.m. may not be returned until the following business day.  We are closed weekends and major holidays. You have access to a nurse at all times for urgent questions. Please call the main number to the clinic Dept: [phone] and follow the prompts.   For any non-urgent questions, you may also contact your provider using MyChart. We now offer e-Visits for anyone 67 and older to request care online for non-urgent symptoms. For details visit [email or web address].   Also download the MyChart app! Go to the app store, search "MyChart", open the app, select [facility], and log in with your MyChart username and password.  Due to Covid, a mask is required upon entering the hospital/clinic. If you do not have a mask, one will be given to you upon arrival. For doctor visits, patients may have 1 support person aged 21 or older with them. For treatment visits, patients cannot have anyone with them due to current Covid guidelines and our immunocompromised population.

## 2021-03-01 ENCOUNTER — Telehealth: Payer: Self-pay | Admitting: Hematology

## 2021-03-01 NOTE — Telephone Encounter (Signed)
Scheduled follow-up appointments per 11/3 los. Patient is aware. 

## 2021-03-03 ENCOUNTER — Encounter: Payer: Self-pay | Admitting: Hematology

## 2021-03-03 NOTE — Progress Notes (Signed)
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 female who has been referred to [place] by Dr Layne Benton for evaluation and management of possible lymphoproliferative disease.The pt reports that she is doing well overall.  The pt reports that she had an MRI earlier this month because of her back pain. She has had back pain for about 30 years, but in May, it got so bad that she could not move. The pain radiated around her hip and down to her knee. She could not even get in her car to go to the ER. She put herself on bedrest for 5 weeks which improved her symptoms. Now, her back pain is back to baseline.  Pt reported that, in the past 6 months, she has been moving her bowels 3-4x daily, which is much more than normal for her. Throughout the day, her stools get progressively looser. She went to the ER on 10/11/2018 for food poisoning from mayonnaise because she had blood in the stool and cramping. The symptoms began 2 hours after she ate the mayo. She was not placed on antibiotics or any other medications. No stool testing was done. Her symptoms resolved in 2 days.  She experienced night sweats up until 2006, and they returned 6 months ago. She does not take hormone replacements because of her FHx of breast cancer. She also reports some new fatigue, dry eyes, and headaches over the last 6 months. Denies unexpected weight loss. She has not been diagnosed with diabetes.  Of note prior to the patient's visit today, pt has had MRI lumbar spine wo contrast completed on 11/02/2018 with results revealing "Extensive retroperitoneal lymphadenopathy highly worrisome for neoplastic process such as lymphoproliferative disease. Spondylosis worst at L4-5 where there  is narrowing in the right subarticular recess predominantly due to a synovial cyst off the medial margin of the right facet with encroachment on the descending right L5 root. There is mild central canal narrowing overall at this Level."  Most recent lab results (10/11/2018) of CBC is as follows: all values are WNL except for Potassium at 3.3, glucose bld at 212. 10/11/2018 Urinalysis revealed hazy appearance 10/11/2018 Lipase at 29  On review of systems, pt reports back pain, 3-4 bowel movements daily, dry eyes, headaches, night sweats, and denies unexpected weight loss, mouth sores, belly pain, changes in urination trouble swallowing, fever, chills, and any other symptoms.   On PMHx the pt reports total hysterectomy at 38 years ago due to ovarian cysts, lymphoreticulosis, sleep apnea related surgery, breast augmentation with no removal of breast tissue On Social Hx the pt reports never smoking On Family Hx the pt reports breast cancer on both sides of the family.   INTERVAL HISTORY  Meghan Alexander is a 65 y.o. female presenting today for the management and evaluation of her follicular lymphoma and next dose of maintenance Rituxan.  Patient was last seen in clinic on 12/18/2020. Patient notes that she had a significant bronchitis about 4 to 6 weeks ago which was treated by her primary care physician with antibiotics.  She notes she got tested for COVID x2 and this was negative.  She notes that she usually gets respiratory symptoms at the start of fall and wonders if it was an allergy or infection.  She notes that her symptoms are completely resolved with antibiotics.  Patient currently has no fevers no chills no night sweats no cough no overt shortness of breath or exertional dyspnea. No new palpable lumps or bumps Lab results today 02/25/2021 CBC within normal limits other than lymphopenia of 600, CMP within normal limits other than blood sugar level of 232 patient notes that she has been  diagnosed as having diabetes and is currently trying to control this with diet exercise and lifestyle modifications. LDH stable at 229.  Patient notes her swallowing has been stable with no uncontrolled nausea from her achalasia at this time.  No overt evidence of GI bleeding.  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 ([place]) 04/02/2019   GERD (gastroesophageal reflux disease)    hx of reflux-went away with elevating HOB    Hypertension    Pneumonia    hx of    PONV (postoperative nausea and vomiting)     SURGICAL HISTORY: Past Surgical History:  Procedure Laterality Date   ABDOMINAL HYSTERECTOMY     ADENOIDECTOMY     cosmetic surgeries     ESOPHAGEAL MANOMETRY N/A 12/02/2015   Procedure: ESOPHAGEAL MANOMETRY (EM);  Surgeon: [name], MD;  Location: [facility];  Service: Endoscopy;  Laterality: N/A;   ESOPHAGOGASTRODUODENOSCOPY N/A 12/02/2015   Procedure: ESOPHAGOGASTRODUODENOSCOPY (EGD);  Surgeon: [name], MD;  Location: [facility];  Service: Endoscopy;  Laterality: N/A;   lymph node removal left leg     cat scratch surgery    THROAT SURGERY     sleep apnea surgery    TONSILLECTOMY      SOCIAL HISTORY: Social History   Socioeconomic History   Marital status: Married    Spouse name: Not on file   Number of children: Not on file   Years of education: Not on file   Highest education level: Not on file  Occupational History   Not on file  Tobacco Use   Smoking status: Never   Smokeless tobacco: Never  Vaping Use   Vaping Use: Never used  Substance and Sexual Activity   Alcohol use: No    Alcohol/week: 0.0 standard drinks   Drug use: No   Sexual activity: Not on file  Other Topics Concern   Not on file  Social History Narrative    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 [employer]   Never smoker never drinker   No drugs   Multiple allergies   Lives at home with her husband of 8 years since 2008   Social Determinants of Health   Financial Resource Strain: Not on file  Food Insecurity: Not on file  Transportation Needs: Not on file  Physical Activity: Not on file  Stress: Not on file  Social Connections: Not on file  Intimate Partner Violence: Not on file    FAMILY HISTORY: Family History  Problem Relation Age of Onset   Lung cancer Father        1982 died from it   Hernia Mother    Breast cancer Maternal Aunt        multiple    Breast cancer Maternal Grandmother    Breast cancer Paternal Grandmother    Breast cancer  Cousin        cousins multiple    Colon cancer Neg Hx    Colon polyps Neg Hx    Rectal cancer Neg Hx    Stomach cancer Neg Hx     ALLERGIES:  is allergic to latex, codeine, atenolol, other, hydrocodone, and oxycodone.  MEDICATIONS:  Current Outpatient Medications  Medication Sig Dispense Refill   acyclovir (ZOVIRAX) 400 MG tablet TAKE 1 TABLET(400 MG) BY MOUTH DAILY 30 tablet 6   amLODipine (NORVASC) 10 MG tablet Take 10 mg by mouth daily.     diphenhydrAMINE (BENADRYL) 25 MG tablet Take 25 mg by mouth at bedtime.     losartan-hydrochlorothiazide (HYZAAR) 50-12.5 MG tablet Take 1 tablet by mouth daily.     Melatonin 10 MG CAPS Take 10 mg by mouth at bedtime.      meloxicam (MOBIC) 15 MG tablet Take 15 mg by mouth daily.     metFORMIN (GLUCOPHAGE) 500 MG tablet Take 500 mg by mouth daily.     metoprolol succinate (TOPROL-XL) 50 MG 24 hr tablet Take 50 mg by mouth daily.     Multiple Vitamin (MULTIVITAMIN) tablet Take 1 tablet by mouth daily.     omeprazole (PRILOSEC) 20 MG capsule Take 20 mg by mouth 2 (two) times daily.     rosuvastatin (CRESTOR) 10 MG tablet Take 10 mg by mouth daily.     venlafaxine (EFFEXOR) 37.5 MG tablet Take 37.5 mg by mouth once.      aspirin EC 81 MG tablet Take 81 mg by mouth daily.     lisinopril-hydrochlorothiazide (ZESTORETIC) 20-12.5 MG tablet Take 1 tablet by mouth daily.     Magnesium Hydroxide (DULCOLAX PO) Take 1 tablet by mouth 2 (two) times daily.     No current facility-administered medications for this visit.    REVIEW OF SYSTEMS: .10 Point review of Systems was done is negative except as noted above.    PHYSICAL EXAMINATION: ECOG FS:1 - Symptomatic but completely ambulatory  Vitals:   02/25/21 1106  BP: 133/83  Pulse: (!) 101  Resp: 20  Temp: (!) 97 F (36.1 C)  SpO2: 97%   Wt Readings from Last 3 Encounters:  02/25/21 190 lb 9.6 oz (86.5 kg)  12/18/20 191 lb 3.2 oz (86.7 kg)  10/20/20 193 lb 12.8 oz (87.9 kg)   Body mass index is 34.86 kg/m.  NAD . GENERAL:alert, in no acute distress and comfortable SKIN: no acute rashes, no significant lesions EYES: conjunctiva are pink and non-injected, sclera anicteric OROPHARYNX: MMM, no exudates, no oropharyngeal erythema or ulceration NECK: supple, no JVD LYMPH:  no palpable lymphadenopathy in the cervical, axillary or inguinal regions LUNGS: clear to auscultation b/l with normal respiratory effort HEART: regular rate & rhythm ABDOMEN:  normoactive bowel sounds , non tender, not distended. Extremity: no pedal edema PSYCH: alert & oriented x 3 with fluent speech NEURO: no focal motor/sensory deficits   LABORATORY DATA:  I have reviewed the data as listed  CBC Latest Ref Rng & Units 02/25/2021 12/18/2020 10/20/2020  WBC 4.0 - 10.5 K/uL 6.2 6.4 5.1  Hemoglobin 12.0 - 15.0 g/dL 13.2 13.8 13.2  Hematocrit 36.0 - 46.0 % 37.7 39.7 37.8  Platelets 150 - 400 K/uL 208 210 194    CMP Latest Ref Rng & Units 02/25/2021 12/18/2020 10/20/2020  Glucose 70 - 99 mg/dL 232(H) 260(H) 231(H)  BUN 8 - 23 mg/dL 15 12 16   Creatinine 0.44 - 1.00 mg/dL 0.96 1.19(H) 1.15(H)  Sodium 135 -  145 mmol/L 139 139 139  Potassium 3.5 - 5.1 mmol/L 3.7 4.1 3.8   Chloride 98 - 111 mmol/L 98 98 101  CO2 22 - 32 mmol/L 25 27 24   Calcium 8.9 - 10.3 mg/dL 9.4 9.8 9.6  Total Protein 6.5 - 8.1 g/dL 7.1 7.3 7.0  Total Bilirubin 0.3 - 1.2 mg/dL 0.5 0.4 0.4  Alkaline Phos 38 - 126 U/L 75 73 55  AST 15 - 41 U/L 36 34 34  ALT 0 - 44 U/L 43 56(H) 53(H)   . Lab Results  Component Value Date   LDH 229 (H) 02/25/2021   05/14/2019 CT ANGIO CHEST PE W OR WO CONTRAST (Accession [id number]):   03/08/2019 Lymph Node Biopsy [id number]):      RADIOGRAPHIC STUDIES: I have personally reviewed the radiological images as listed and agreed with the findings in the report. No results found.  ASSESSMENT & PLAN:   #1 Stage III/IV follicular lymphoma - likely low grade but accurate grading difficult  11/02/2018 MRI lumbar spine wo contrast revealed "Extensive retroperitoneal lymphadenopathy highly worrisome for neoplastic process such as lymphoproliferative disease. Spondylosis worst at L4-5 where there is narrowing in the right subarticular recess predominantly due to a synovial cyst off the medial margin of the right facet with encroachment on the descending right L5 root. There is mild central canal narrowing overall at this Level."  12/04/2018 CT CAP revealed "Mild-to-moderate abdominal and pelvic lymphadenopathy and new 9 mm left superior mediastinal lymph node, highly suspicious for lymphoproliferative disorder. Stable 8 mm lingular pulmonary nodule, consistent with benign Etiology. Moderate hepatic steatosis. 1.3 cm indeterminate low-attenuation lesion in the right lobe. Recommend continued attention on follow-up CT."  12/18/2018 lymph node biopsy pathology revealing a follicular lymphoma- likely low grade but accurate grading not possible on limited sampling.  02/06/2019 PET/CT scan ([id number]) revealed "1. Bulky hypermetabolic lymphadenopathy in the abdomen and upper pelvis, as described. This is associated with mild hypermetabolic lymphadenopathy in  the upper mediastinum and right axilla. Hypermetabolic lymphadenopathy represents a combination of Deauville 4 and Deauville 5 disease. 2. 8 mm nodule in the lingula without hypermetabolism. Attention on follow-up recommended. 3. Hepatic steatosis. 4.  Aortic Atherosclerois (ICD10-170.0)."  03/08/2019 Lymph Node Biopsy ([id number]) revealed "LYMPH NODE, NEEDLE CORE BIOPSY: - Follicular lymphoma (grade 1-2 of 3) with elevated proliferation rate."  2. Mild treatment related thrombocytopenia- resolved.  PLAN: -Discussed pt labwork today, blood counts are normal.  Blood chemistries are stable other than some hyperglycemia with blood sugars around 232. LDH stable -No lab or clinical evidence of Follicular Lymphoma recurrence/progression at this time.  -The pt has no prohibitive toxicities from continuing maintenance Rituxan every two months. -Continue 2000 IU Vitamin D daily. -Recommended pt take Multivitamin daily and Vitamin B12.  -continue Vitamin B-Complex daily. -patient did have a bronchitis/pneumonia about 4-6 weeks ago which has since resolved.  3.  H/o Melena.  2 episodes about a month ago.  Hemoglobin stable today. Previously had upper abdominal discomfort which has resolved. Has some chronic nausea due to achalasia. PLAN -Follows with Dr. Paulita Fujita gastroenterology-patient notes she is being scheduled for an EGD  and colonoscopy. -No contraindication for EGD or colonoscopy from oncology standpoint. Had been on aspirin which has been stopped. -Is on PPI twice daily.  4.  Chronic ALT elevation -likely from her fatty liver. Plan -Follow-up with PCP and GI for management of hepatic steatosis/steatohepatitis. -Avoid hepatotoxic medications. -Recommended optimal diet and exercise and to optimize control of diabetes with PCP.  5. DM2 -  following with PCP -currently pursuing lifestyle modification, diet and exercise.  FOLLOW UP:  Please schedule next 3 doses of maintenance Rituxan  every 2 months with port flush, labs and MD visits  . The total time spent in the appointment was 30 minutes and more than 50% was on counseling and direct patient cares., ordering, toxicity assessment and mx of Rituxan antineoplastic treatment   All of the patient's questions were answered with apparent satisfaction. The patient knows to call the clinic with any problems, questions or concerns.   Sullivan Lone MD [place] AAHIVMS [facility] Hematology/Oncology Physician [facility]

## 2021-03-24 ENCOUNTER — Other Ambulatory Visit: Payer: Self-pay | Admitting: Hematology

## 2021-04-13 IMAGING — CT CT ANGIO CHEST
2 of 6 series · 18 of 46 positions shown · IV contrast (omnipaque)
Comparison: PET-CT 01/27/2019

CLINICAL DATA: Shortness of breath and elevated D-dimer. History of
lymphoma

EXAM:
CT ANGIOGRAPHY CHEST WITH CONTRAST
TECHNIQUE: Multidetector CT imaging of the chest was performed using the
standard protocol during bolus administration of intravenous
contrast. Multiplanar CT image reconstructions and MIPs were
obtained to evaluate the vascular anatomy.
CONTRAST:  100mL OMNIPAQUE IOHEXOL 350 MG/ML SOLN

[Series 5: thins · axial · 0.64mm/px · z∈[-313,-63]mm · 16 of 274 slices shown]
[im 12/274  lung]
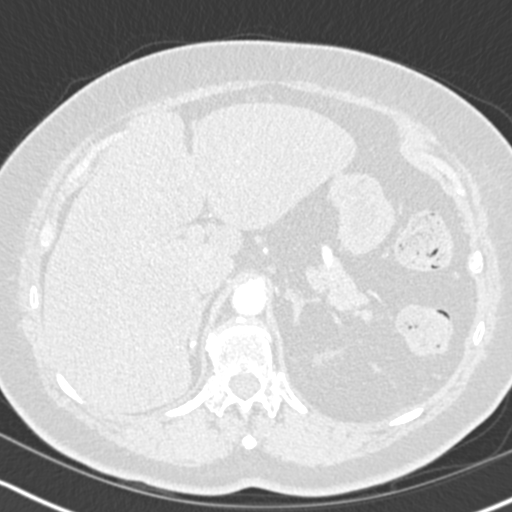
[im 36/274  soft-tissue]
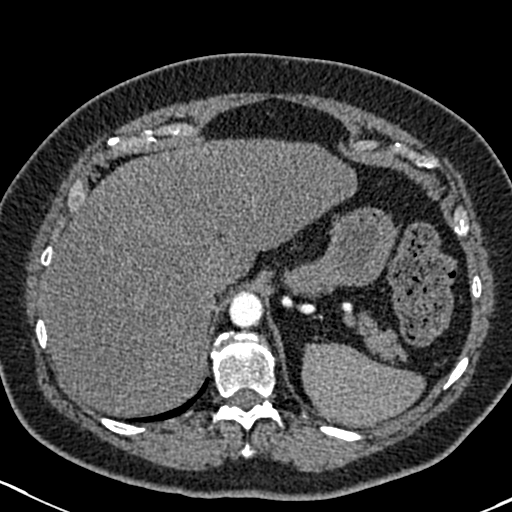
[im 48/274  lung]
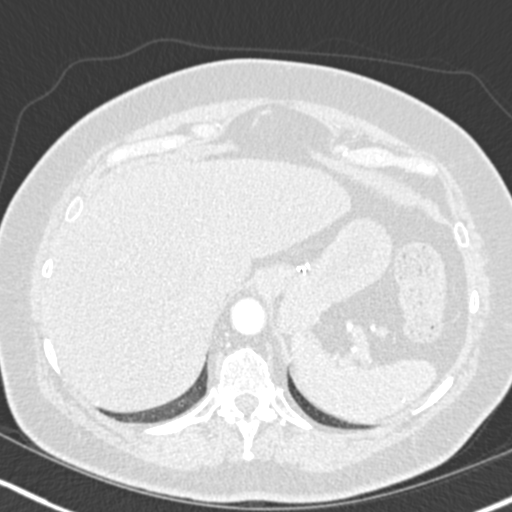
[im 60/274  soft-tissue]
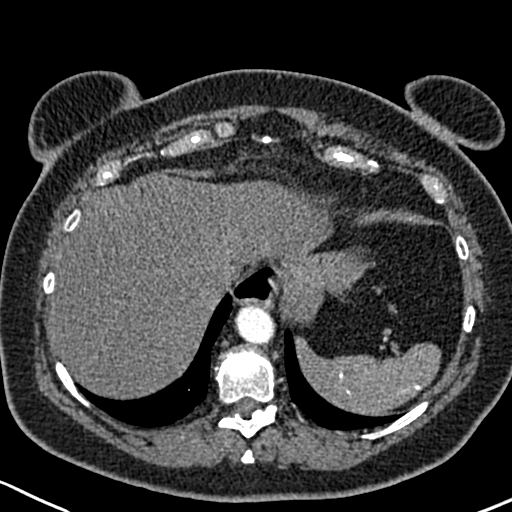
[im 84/274  lung]
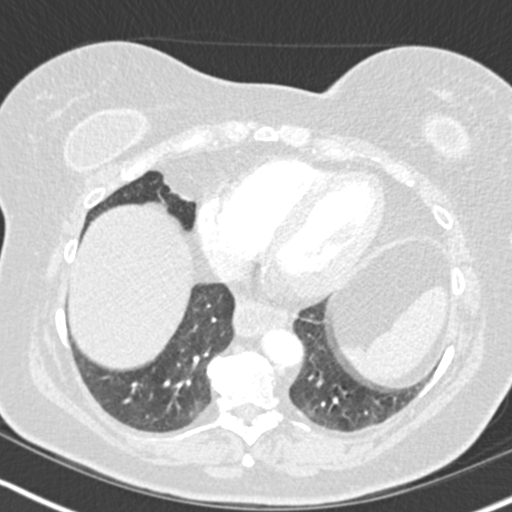
[im 95/274  soft-tissue]
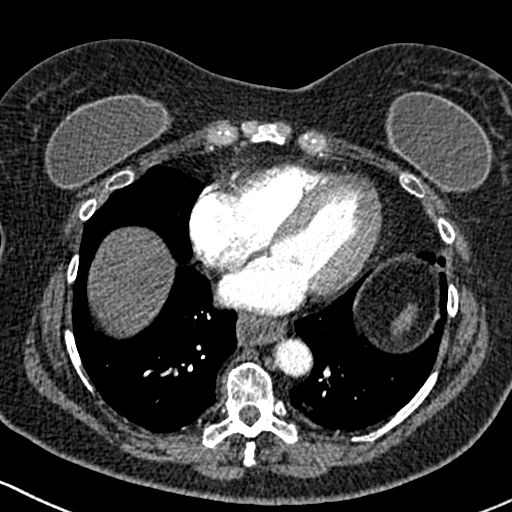
[im 107/274  lung]
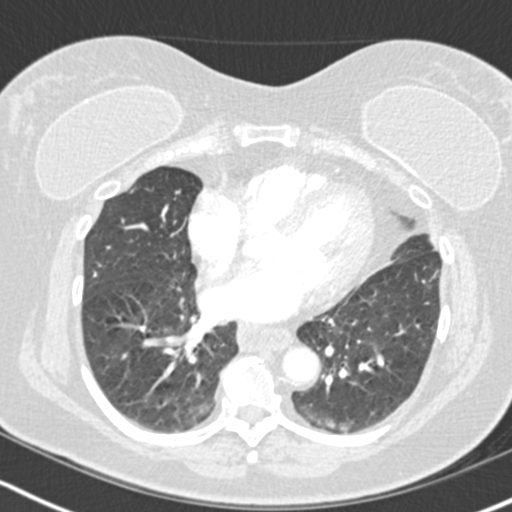
[im 131/274  soft-tissue]
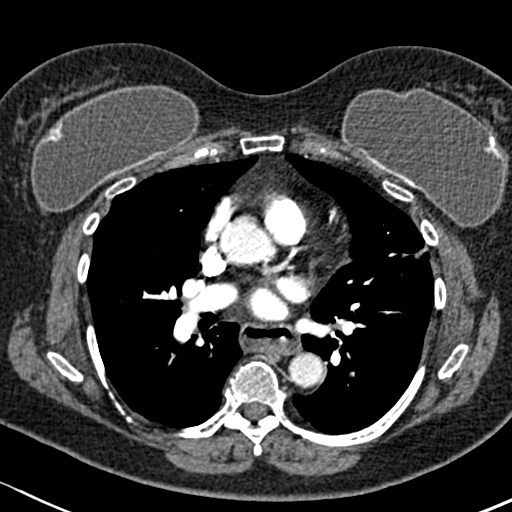
[im 143/274  lung]
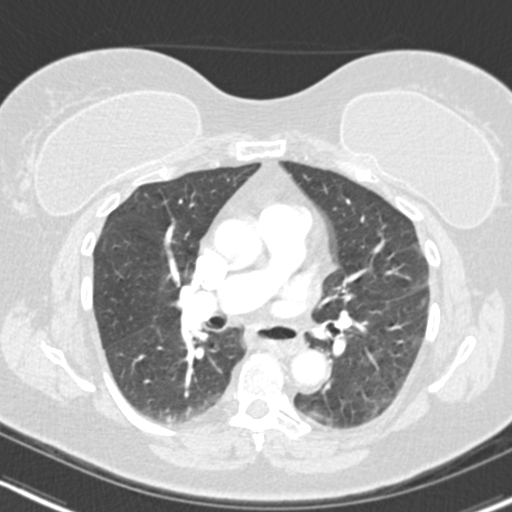
[im 167/274  soft-tissue]
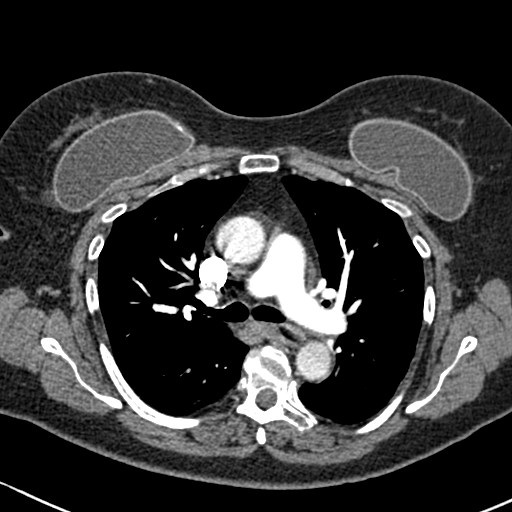
[im 179/274  lung]
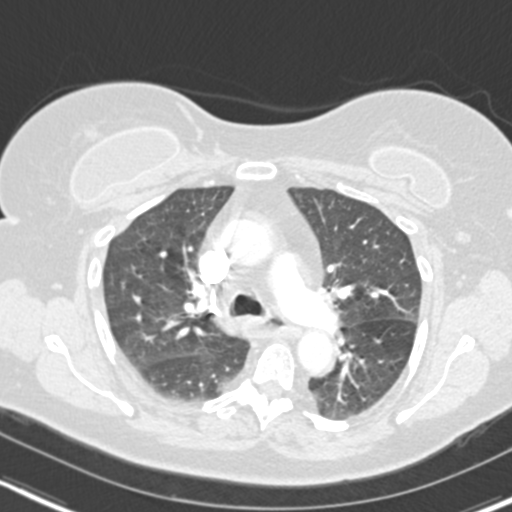
[im 190/274  soft-tissue]
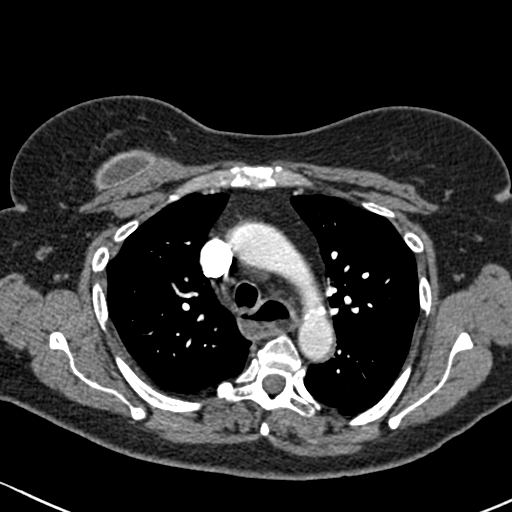
[im 214/274  lung]
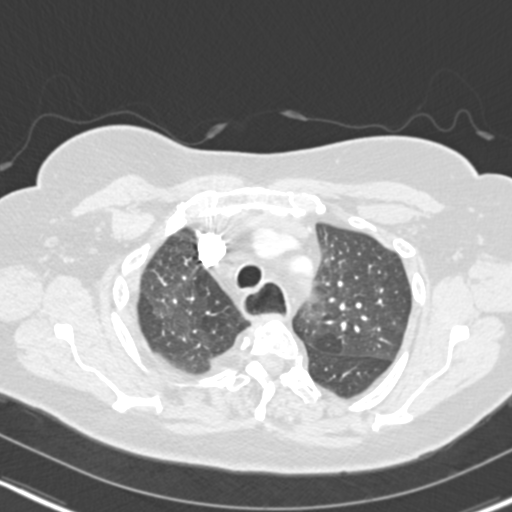
[im 226/274  soft-tissue]
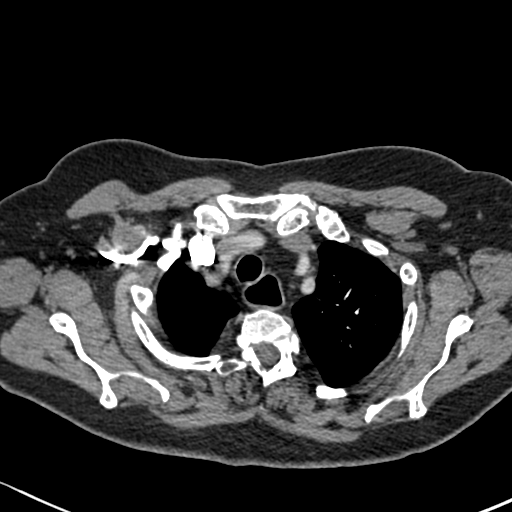
[im 238/274  lung]
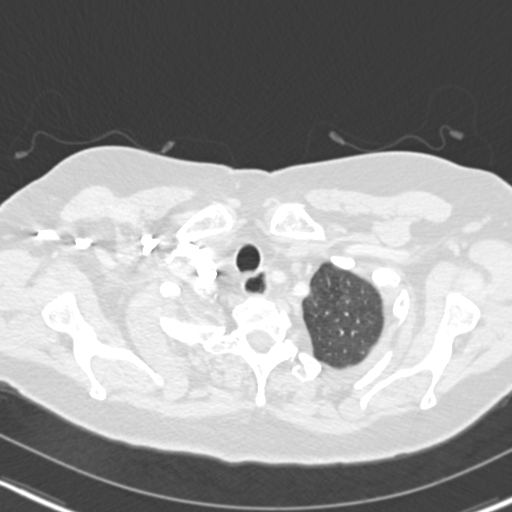
[im 262/274  soft-tissue]
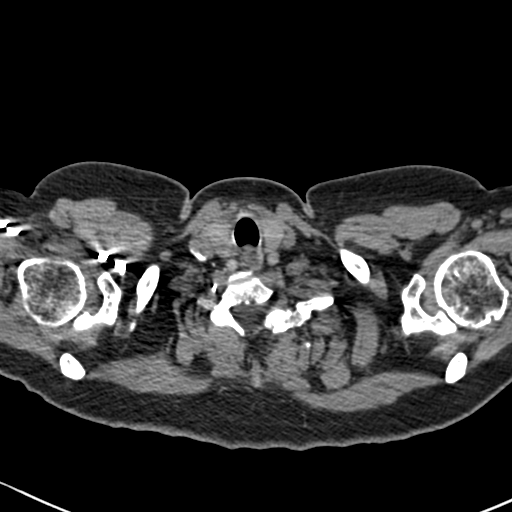

[Series 7: coronal mpr · coronal · 0.54mm/px · 2 of 89 slices shown]
[im 30/89  soft-tissue]
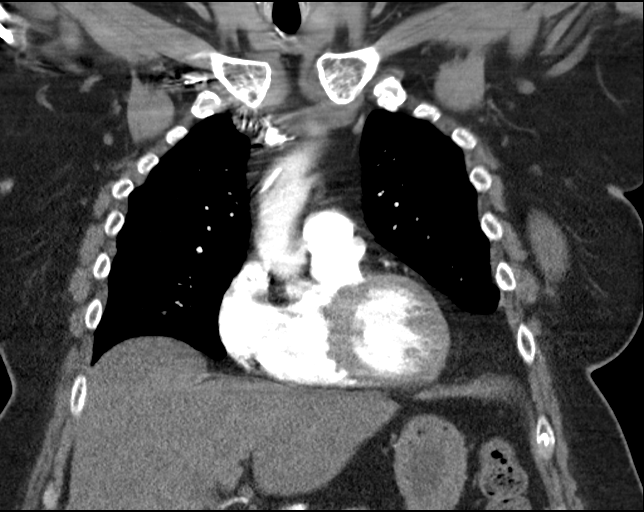
[im 59/89  soft-tissue]
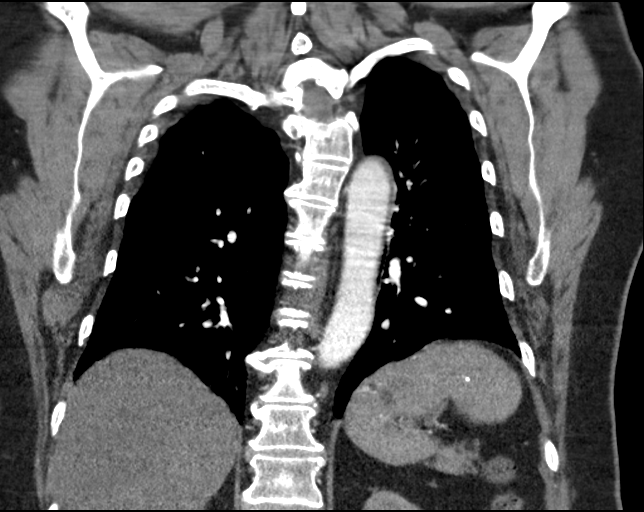

[18 of 46 positions shown; findings below may reference images not displayed]

FINDINGS: Cardiovascular: The heart is normal in size. No pericardial
effusion. There is mild tortuosity of the thoracic aorta. Minimal
scattered atherosclerotic calcifications but no dissection. The
branch vessels are patent.

The pulmonary arterial tree is well opacified. No filling defects to
suggest pulmonary embolism.

Mediastinum/Nodes: No enlarged mediastinal or hilar lymph nodes are
identified. Upper left mediastinal lymph node on image [DATE] measures
4 mm. This previously measured 8 mm and was hypermetabolic on the
PET CT scan.

Diffuse distention of the esophagus. Could not exclude achalasia. No
esophageal mass.

Lungs/Pleura: No acute pulmonary findings. No infiltrates or
effusions. Minimal dependent subpleural atelectasis noted. No
worrisome pulmonary lesions.

Upper Abdomen: Diffuse fatty infiltration of the liver noted.
Calcified hepatic and splenic granulomas.

Musculoskeletal: Bilateral breast prosthesis. No axillary or
supraclavicular adenopathy. The bony thorax is intact.

Review of the MIP images confirms the above findings.
IMPRESSION: 1. No CT findings for pulmonary embolism.
2. Normal thoracic aorta.
3. No acute pulmonary findings.
4. Diffuse distention of the esophagus. Could not exclude achalasia.

Aortic Atherosclerosis ([id number]).

## 2021-04-20 ENCOUNTER — Ambulatory Visit: Payer: Medicare Other | Admitting: Physician Assistant

## 2021-04-20 ENCOUNTER — Ambulatory Visit: Payer: Medicare Other

## 2021-04-20 ENCOUNTER — Other Ambulatory Visit: Payer: Medicare Other

## 2021-04-27 ENCOUNTER — Ambulatory Visit: Payer: Medicare Other

## 2021-04-27 ENCOUNTER — Other Ambulatory Visit: Payer: Medicare Other

## 2021-04-27 ENCOUNTER — Ambulatory Visit: Payer: Medicare Other | Admitting: Hematology

## 2021-05-11 ENCOUNTER — Inpatient Hospital Stay: Payer: Medicare Other

## 2021-05-11 ENCOUNTER — Inpatient Hospital Stay: Payer: Medicare Other | Attending: Hematology | Admitting: Hematology

## 2021-05-11 ENCOUNTER — Other Ambulatory Visit: Payer: Medicare Other

## 2021-05-11 ENCOUNTER — Other Ambulatory Visit: Payer: Self-pay

## 2021-05-11 VITALS — BP 126/77 | HR 87 | Temp 97.5°F | Resp 18 | Wt 187.6 lb

## 2021-05-11 DIAGNOSIS — C8218 Follicular lymphoma grade II, lymph nodes of multiple sites: Secondary | ICD-10-CM | POA: Insufficient documentation

## 2021-05-11 LAB — CBC WITH DIFFERENTIAL (CANCER CENTER ONLY)
Abs Immature Granulocytes: 0.03 10*3/uL (ref 0.00–0.07)
Basophils Absolute: 0 10*3/uL (ref 0.0–0.1)
Basophils Relative: 0 %
Eosinophils Absolute: 0.1 10*3/uL (ref 0.0–0.5)
Eosinophils Relative: 1 %
HCT: 36.1 % (ref 36.0–46.0)
Hemoglobin: 12.6 g/dL (ref 12.0–15.0)
Immature Granulocytes: 1 %
Lymphocytes Relative: 8 %
Lymphs Abs: 0.3 10*3/uL — ABNORMAL LOW (ref 0.7–4.0)
MCH: 29.6 pg (ref 26.0–34.0)
MCHC: 34.9 g/dL (ref 30.0–36.0)
MCV: 84.9 fL (ref 80.0–100.0)
Monocytes Absolute: 0.8 10*3/uL (ref 0.1–1.0)
Monocytes Relative: 20 %
Neutro Abs: 2.9 10*3/uL (ref 1.7–7.7)
Neutrophils Relative %: 70 %
Platelet Count: 148 10*3/uL — ABNORMAL LOW (ref 150–400)
RBC: 4.25 MIL/uL (ref 3.87–5.11)
RDW: 12.2 % (ref 11.5–15.5)
WBC Count: 4.2 10*3/uL (ref 4.0–10.5)
nRBC: 0 % (ref 0.0–0.2)

## 2021-05-11 LAB — CMP (CANCER CENTER ONLY)
ALT: 59 U/L — ABNORMAL HIGH (ref 0–44)
AST: 41 U/L (ref 15–41)
Albumin: 4.2 g/dL (ref 3.5–5.0)
Alkaline Phosphatase: 75 U/L (ref 38–126)
Anion gap: 11 (ref 5–15)
BUN: 16 mg/dL (ref 8–23)
CO2: 27 mmol/L (ref 22–32)
Calcium: 9.3 mg/dL (ref 8.9–10.3)
Chloride: 99 mmol/L (ref 98–111)
Creatinine: 0.99 mg/dL (ref 0.44–1.00)
GFR, Estimated: >60 mL/min (ref >60)
Glucose, Bld: 329 mg/dL — ABNORMAL HIGH (ref 70–99)
Potassium: 4.2 mmol/L (ref 3.5–5.1)
Sodium: 137 mmol/L (ref 135–145)
Total Bilirubin: 0.4 mg/dL (ref 0.3–1.2)
Total Protein: 6.6 g/dL (ref 6.5–8.1)

## 2021-05-11 LAB — LACTATE DEHYDROGENASE: LDH: 189 U/L (ref 98–192)

## 2021-05-13 ENCOUNTER — Telehealth: Payer: Self-pay | Admitting: Hematology

## 2021-05-13 NOTE — Telephone Encounter (Signed)
Left message with follow-up appointment per 11/7 los. 

## 2021-05-17 ENCOUNTER — Encounter: Payer: Self-pay | Admitting: Hematology

## 2021-05-17 NOTE — Progress Notes (Signed)
HEMATOLOGY/ONCOLOGY CLINIC NOTE  Date of Service: .05/11/2021   Patient Care Team: [name], MD as PCP - General (Internal Medicine)  CHIEF COMPLAINTS/PURPOSE OF CONSULTATION:  Follow-up for follicular lymphoma and next cycle of maintenance Rituxan.  HISTORY OF PRESENTING ILLNESS:  Please see previous notes for details on initial presentation.   INTERVAL HISTORY  Meghan Alexander is a 66 y.o. 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  She has developed severe bronchitis and also has had pneumonia since her last clinic visit.  Also prior to her previous cycle of Rituxan she had developed a severe episode of bronchitis. We discussed in detail and noted that in the context of her recurrent significant respiratory infections we could consider discontinuation of her maintenance Rituxan. After a thorough discussion we came to a shared decision to hold off on additional Rituxan maintenance at this time to avoid additional immunosuppression. She will continue with her primary care physician for optimization of her pulmonary status and diabetes.  Patient notes no new lumps or bumps.  Currently no fevers no chills no night sweats no unexpected weight loss. Labs done today were reviewed with the patient.  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 ([place]) 04/02/2019   GERD (gastroesophageal reflux disease)    hx of reflux-went away with elevating HOB    Hypertension    Pneumonia    hx of    PONV (postoperative nausea and vomiting)     SURGICAL HISTORY: Past Surgical History:  Procedure Laterality Date   ABDOMINAL HYSTERECTOMY     ADENOIDECTOMY     cosmetic surgeries      ESOPHAGEAL MANOMETRY N/A 12/02/2015   Procedure: ESOPHAGEAL MANOMETRY (EM);  Surgeon: [name], MD;  Location: [facility];  Service: Endoscopy;  Laterality: N/A;   ESOPHAGOGASTRODUODENOSCOPY N/A 12/02/2015   Procedure: ESOPHAGOGASTRODUODENOSCOPY (EGD);  Surgeon: [name], MD;  Location: [facility];  Service: Endoscopy;  Laterality: N/A;   lymph node removal left leg     cat scratch surgery    THROAT SURGERY     sleep apnea surgery    TONSILLECTOMY      SOCIAL HISTORY: Social History   Socioeconomic History   Marital status: Married    Spouse name: Not on file   Number of children: Not on file   Years of education: Not on file   Highest education level: Not on file  Occupational History   Not on file  Tobacco Use   Smoking status: Never   Smokeless tobacco: Never  Vaping Use   Vaping Use: Never used  Substance and Sexual Activity   Alcohol use: No    Alcohol/week: 0.0 standard drinks   Drug use: No   Sexual activity: Not on file  Other Topics Concern   Not on file  Social History Narrative   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 [employer]   Never smoker never drinker   No drugs   Multiple allergies   Lives at home with her husband of 8 years since 2008   Social Determinants of Health   Financial Resource Strain: Not on file  Food Insecurity: Not on file  Transportation Needs: Not on file  Physical Activity: Not on file  Stress: Not on file  Social Connections: Not on file  Intimate Partner Violence: Not on file    FAMILY HISTORY: Family History  Problem Relation Age of Onset   Lung cancer Father        1982 died from it   Hernia Mother    Breast cancer Maternal Aunt        multiple    Breast cancer Maternal Grandmother    Breast cancer Paternal Grandmother    Breast cancer Cousin        cousins multiple    Colon cancer Neg Hx    Colon polyps Neg Hx    Rectal cancer Neg Hx    Stomach cancer Neg Hx      ALLERGIES:  is allergic to latex, codeine, atenolol, other, hydrocodone, and oxycodone.  MEDICATIONS:  Current Outpatient Medications  Medication Sig Dispense Refill   acyclovir (ZOVIRAX) 400 MG tablet TAKE 1 TABLET(400 MG) BY MOUTH DAILY 30 tablet 6   amLODipine (NORVASC) 10 MG tablet Take 10 mg by mouth daily.     aspirin EC 81 MG tablet Take 81 mg by mouth daily.     diphenhydrAMINE (BENADRYL) 25 MG tablet Take 25 mg by mouth at bedtime.     lisinopril-hydrochlorothiazide (ZESTORETIC) 20-12.5 MG tablet Take 1 tablet by mouth daily.     losartan-hydrochlorothiazide (HYZAAR) 50-12.5 MG tablet Take 1 tablet by mouth daily.     Magnesium Hydroxide (DULCOLAX PO) Take 1 tablet by mouth 2 (two) times daily.     Melatonin 10 MG CAPS Take 10 mg by mouth at bedtime.      meloxicam (MOBIC) 15 MG tablet Take 15 mg by mouth daily.     metFORMIN (GLUCOPHAGE) 500 MG tablet Take 500 mg by mouth daily.     metoprolol succinate (TOPROL-XL) 50 MG 24 hr tablet Take 50 mg by mouth daily.     Multiple Vitamin (MULTIVITAMIN) tablet Take 1 tablet by mouth daily.     omeprazole (PRILOSEC) 20 MG capsule Take 20 mg by mouth 2 (two) times daily.     rosuvastatin (CRESTOR) 10 MG tablet Take 10 mg by mouth daily.     venlafaxine (EFFEXOR) 37.5 MG tablet Take 37.5 mg by mouth once.     No current facility-administered medications for this visit.    REVIEW OF SYSTEMS: .10 Point review of Systems was done is negative except as noted above.  PHYSICAL EXAMINATION: ECOG FS:1 - Symptomatic but completely ambulatory  Vitals:   05/11/21 1059  BP: 126/77  Pulse: 87  Resp: 18  Temp: (!) 97.5 F (36.4 C)  SpO2: 95%   Wt Readings from Last 3 Encounters:  05/11/21 187 lb 9.6 oz (85.1 kg)  02/25/21 190 lb 9.6 oz (86.5 kg)  12/18/20 191 lb 3.2 oz (86.7 kg)   Body mass index is 34.31 kg/m.  NAD . GENERAL:alert, in no acute distress and comfortable SKIN: no acute rashes, no significant lesions EYES:  conjunctiva are pink and non-injected, sclera anicteric OROPHARYNX: MMM, no exudates, no oropharyngeal erythema or ulceration NECK: supple, no JVD LYMPH:  no palpable lymphadenopathy in the cervical, axillary or inguinal regions LUNGS: clear to auscultation b/l with normal respiratory effort HEART: regular rate & rhythm ABDOMEN:  normoactive bowel sounds , non tender, not distended. Extremity: no pedal edema PSYCH: alert & oriented x 3 with fluent speech NEURO: no focal motor/sensory deficits    LABORATORY DATA:  I have reviewed the data as listed  CBC Latest Ref Rng & Units 05/11/2021 02/25/2021 12/18/2020  WBC 4.0 - 10.5 K/uL 4.2 6.2 6.4  Hemoglobin 12.0 - 15.0 g/dL 12.6 13.2 13.8  Hematocrit 36.0 - 46.0 % 36.1 37.7 39.7  Platelets 150 - 400 K/uL 148(L) 208 210    CMP Latest Ref Rng & Units 05/11/2021 02/25/2021 12/18/2020  Glucose 70 - 99 mg/dL 329(H) 232(H) 260(H)  BUN 8 - 23 mg/dL 16 15 12   Creatinine 0.44 - 1.00 mg/dL 0.99 0.96 1.19(H)  Sodium 135 - 145 mmol/L 137 139 139  Potassium 3.5 - 5.1 mmol/L 4.2 3.7 4.1  Chloride 98 - 111 mmol/L 99 98 98  CO2 22 - 32 mmol/L 27 25 27   Calcium 8.9 - 10.3 mg/dL 9.3 9.4 9.8  Total Protein 6.5 - 8.1 g/dL 6.6 7.1 7.3  Total Bilirubin 0.3 - 1.2 mg/dL 0.4 0.5 0.4  Alkaline Phos 38 - 126 U/L 75 75 73  AST 15 - 41 U/L 41 36 34  ALT 0 - 44 U/L 59(H) 43 56(H)   . Lab Results  Component Value Date   LDH 189 05/11/2021   05/14/2019 CT ANGIO CHEST PE W OR WO CONTRAST (Accession [id number]):   03/08/2019 Lymph Node Biopsy [id number]):      RADIOGRAPHIC STUDIES: I have personally reviewed the radiological images as listed and agreed with the findings in the report. No results found.  ASSESSMENT & PLAN:   #1 Stage III/IV follicular lymphoma - likely low grade but accurate grading difficult  11/02/2018 MRI lumbar spine wo contrast revealed "Extensive retroperitoneal lymphadenopathy highly worrisome for neoplastic process such as  lymphoproliferative disease. Spondylosis worst at L4-5 where there is narrowing in the right subarticular recess predominantly due to a synovial cyst off the medial margin of the right facet with encroachment on the descending right L5 root. There is mild central canal narrowing overall at this Level."  12/04/2018 CT CAP revealed "Mild-to-moderate abdominal and pelvic lymphadenopathy and new 9 mm left superior mediastinal lymph node, highly suspicious for lymphoproliferative disorder. Stable 8 mm lingular pulmonary nodule, consistent with benign Etiology. Moderate hepatic steatosis. 1.3 cm indeterminate low-attenuation lesion in the right lobe. Recommend continued attention on follow-up CT."  12/18/2018 lymph node biopsy pathology revealing a follicular lymphoma- likely low grade but accurate grading not possible on limited sampling.  02/06/2019 PET/CT scan ([id number]) revealed "1. Bulky hypermetabolic lymphadenopathy in the abdomen and upper pelvis, as described. This is associated with mild hypermetabolic lymphadenopathy in the upper mediastinum and right axilla. Hypermetabolic lymphadenopathy represents a combination of Deauville 4 and Deauville 5 disease. 2. 8 mm nodule in the lingula without hypermetabolism. Attention on follow-up recommended. 3. Hepatic steatosis. 4.  Aortic Atherosclerois (ICD10-170.0)."  03/08/2019 Lymph Node Biopsy ([id number]) revealed "LYMPH NODE, NEEDLE CORE BIOPSY: - Follicular lymphoma (grade 1-2 of 3) with elevated proliferation rate."  2. Mild treatment related thrombocytopenia- resolved.  PLAN: -Patient has no clinical signs or symptoms of follicular lymphoma recurrence/progression at this time -Labs done today were reviewed normal CBC except lymphopenia lymphocyte count of 300 and platelets of 148k in the context of recent respiratory infection. -CMP stable except for uncontrolled hyperglycemia of 329 and ALT of 59. -LDH within normal limits at 189 -Given  patient's recurrent significant respiratory infections after thorough discussion shared decision was made to hold off on additional maintenance Rituxan at this time. -We will repeat CT chest abdomen pelvis in 3 months prior to next follow-up.  3.  H/o Melena.  2 episodes about a month ago.  Hemoglobin stable today. Previously had upper abdominal discomfort which has resolved. Has some chronic nausea due to achalasia. PLAN -Follows with Dr. Paulita Fujita gastroenterology-patient notes she is being scheduled for an EGD  and colonoscopy. -No contraindication for EGD or colonoscopy from oncology standpoint. Had been on aspirin which has been stopped. -Is on PPI twice daily.  4.  Chronic ALT elevation -likely from her fatty liver. Plan -Follow-up with PCP and GI for management of hepatic steatosis/steatohepatitis. -Avoid hepatotoxic medications. -Recommended optimal diet and exercise and to optimize control of diabetes with PCP.  5. DM2 -following with PCP -currently pursuing lifestyle modification, diet and exercise.  FOLLOW UP:  Please cancel all currently scheduled oncology appointments. Patient has completed her planned maintenance Rituxan holding last 3 doses due to recurrent infections. -CT neck chest abdomen pelvis in 12 weeks. -Port flush labs and MD visit in 14 weeks  . The total time spent in the appointment was 30 minutes reviewing her labs clinical course with recurrent infections, thorough discussion about pros and cons of continued maintenance Rituxan, cancellation of maintenance Rituxan ordering and coordination of repeat staging work-up and documentation    All of the patient's questions were answered with apparent satisfaction. The patient knows to call the clinic with any problems, questions or concerns.   Sullivan Lone MD [place] AAHIVMS [facility] Hematology/Oncology Physician [facility]

## 2021-06-21 ENCOUNTER — Other Ambulatory Visit: Payer: Medicare Other

## 2021-06-21 ENCOUNTER — Ambulatory Visit: Payer: Medicare Other | Admitting: Hematology

## 2021-06-21 ENCOUNTER — Ambulatory Visit: Payer: Medicare Other

## 2021-06-23 ENCOUNTER — Other Ambulatory Visit: Payer: Self-pay | Admitting: Physician Assistant

## 2021-06-23 DIAGNOSIS — R1310 Dysphagia, unspecified: Secondary | ICD-10-CM

## 2021-06-25 ENCOUNTER — Ambulatory Visit
Admission: RE | Admit: 2021-06-25 | Discharge: 2021-06-25 | Disposition: A | Discharge status: Home / Self Care | Payer: Medicare Other | Source: Ambulatory Visit | Attending: Physician Assistant | Admitting: Physician Assistant

## 2021-06-25 DIAGNOSIS — R1310 Dysphagia, unspecified: Secondary | ICD-10-CM

## 2021-06-28 ENCOUNTER — Other Ambulatory Visit: Payer: Medicare Other

## 2021-06-28 ENCOUNTER — Ambulatory Visit: Payer: Medicare Other

## 2021-06-28 ENCOUNTER — Ambulatory Visit: Payer: Medicare Other | Admitting: Hematology

## 2021-07-12 ENCOUNTER — Ambulatory Visit: Payer: Medicare Other | Admitting: Hematology

## 2021-07-12 ENCOUNTER — Other Ambulatory Visit: Payer: Medicare Other

## 2021-07-12 ENCOUNTER — Ambulatory Visit: Payer: Medicare Other

## 2021-07-14 ENCOUNTER — Encounter (HOSPITAL_COMMUNITY): Payer: Self-pay

## 2021-07-14 ENCOUNTER — Ambulatory Visit (HOSPITAL_COMMUNITY)
Admission: RE | Admit: 2021-07-14 | Discharge: 2021-07-14 | Disposition: A | Discharge status: Home / Self Care | Payer: Medicare Other | Source: Ambulatory Visit | Attending: Hematology | Admitting: Hematology

## 2021-07-14 ENCOUNTER — Other Ambulatory Visit: Payer: Self-pay

## 2021-07-14 DIAGNOSIS — C8218 Follicular lymphoma grade II, lymph nodes of multiple sites: Secondary | ICD-10-CM | POA: Diagnosis present

## 2021-07-14 LAB — POCT I-STAT CREATININE: Creatinine, Ser: 0.8 mg/dL (ref 0.44–1.00)

## 2021-07-14 MED ORDER — SODIUM CHLORIDE (PF) 0.9 % IJ SOLN
INTRAMUSCULAR | Status: AC
  Filled 2021-07-14: qty 50

## 2021-07-14 MED ORDER — IOHEXOL 300 MG/ML  SOLN
100.0000 mL | Freq: Once | INTRAMUSCULAR | Status: AC | PRN
  Administered 2021-07-14: 100 mL via INTRAVENOUS

## 2021-07-16 ENCOUNTER — Other Ambulatory Visit: Payer: Self-pay

## 2021-07-16 ENCOUNTER — Inpatient Hospital Stay: Payer: Medicare Other | Attending: Hematology | Admitting: Hematology

## 2021-07-16 ENCOUNTER — Other Ambulatory Visit: Payer: Self-pay | Admitting: Hematology

## 2021-07-16 DIAGNOSIS — Z8379 Family history of other diseases of the digestive system: Secondary | ICD-10-CM | POA: Insufficient documentation

## 2021-07-16 DIAGNOSIS — Z801 Family history of malignant neoplasm of trachea, bronchus and lung: Secondary | ICD-10-CM | POA: Insufficient documentation

## 2021-07-16 DIAGNOSIS — I700 Atherosclerosis of aorta: Secondary | ICD-10-CM | POA: Diagnosis not present

## 2021-07-16 DIAGNOSIS — R918 Other nonspecific abnormal finding of lung field: Secondary | ICD-10-CM

## 2021-07-16 DIAGNOSIS — C8218 Follicular lymphoma grade II, lymph nodes of multiple sites: Secondary | ICD-10-CM | POA: Diagnosis not present

## 2021-07-16 DIAGNOSIS — Z79899 Other long term (current) drug therapy: Secondary | ICD-10-CM | POA: Insufficient documentation

## 2021-07-16 DIAGNOSIS — Z803 Family history of malignant neoplasm of breast: Secondary | ICD-10-CM | POA: Insufficient documentation

## 2021-07-16 DIAGNOSIS — R0602 Shortness of breath: Secondary | ICD-10-CM | POA: Diagnosis not present

## 2021-07-16 MED ORDER — SACCHAROMYCES BOULARDII 400 MG PO CAPS
1.0000 | ORAL_CAPSULE | Freq: Two times a day (BID) | ORAL | 0 refills | Status: AC
Start: 2021-07-16 — End: 2021-07-26

## 2021-07-16 MED ORDER — PREDNISONE 5 MG PO TABS: ORAL_TABLET | ORAL | 0 refills | Status: AC

## 2021-07-16 MED ORDER — LEVOFLOXACIN 750 MG PO TABS: 750.0000 mg | ORAL_TABLET | Freq: Every day | ORAL | 0 refills | Status: DC

## 2021-07-23 ENCOUNTER — Encounter: Payer: Self-pay | Admitting: Hematology

## 2021-07-23 NOTE — Addendum Note (Signed)
Addended by: Sullivan Lone on: 07/23/2021 09:17 AM ? ? Modules accepted: Orders ? ?

## 2021-07-23 NOTE — Progress Notes (Addendum)
? ? ?HEMATOLOGY/ONCOLOGY PHONE VISIT NOTE ? ?Date of Service: .07/16/2021 ? ? ?Patient Care Team: ?[name], MD as PCP - General (Internal Medicine) ? 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 ?. 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  ?04/04/2019 -  Chemotherapy  ? Patient is on Treatment Plan : NON-HODGKINS LYMPHOMA Rituximab D1 / Bendamustine D1,2 q28d  ?   ? ? ?HISTORY OF PRESENTING ILLNESS:  ?Please see previous notes for details on initial presentation.  ? ?INTERVAL HISTORY ?.I connected with Meghan Alexander on 07/16/2021 at  2:00 PM EDT by telephone visit and verified that I am speaking with the correct person using two identifiers.  ? ?I discussed the limitations, risks, security and privacy concerns of performing an evaluation and management service by telemedicine and the availability of in-person appointments. I also discussed with the patient that there may be a patient responsible charge related to this service. The patient expressed understanding and agreed to proceed.  ? ?Other persons participating in the visit and their role in the encounter: None ? ?Patient?s location: Home ?Provider?s location: [facility] ? ?Chief Complaint: Discussion of CT neck chest abdomen pelvis for follow-up of follicular lymphoma ? ?Meghan Alexander was called to follow-up on her follicular lymphoma and to discuss the results of her CT neck chest abdomen pelvis.  Patient notes that she had a pneumonia in January 2023 which was treated with antibiotics by her primary care physician.  She thinks that antibiotic was Keflex. ?She notes that her cough has not really completely resolved and she does have some shortness of breath with exertion.  Continues to have somewhat of a  nonproductive cough.  She reports no having had a COVID test or flu test at the time of her initial pneumonia diagnosis. ?No fevers no chills no night sweats. ?No overt symptoms of aspiration. ?Patient completed her maintenance Rituxan for 18 months with the last dose being on 02/25/2021. ?We had decided to hold off on further maintenance. ? ?CT neck on 07/15/2021 showed no evidence of lymphadenopathy in the neck. ?CT chest abdomen pelvis on 07/15/2021 showed no lymphadenopathy within the chest abdomen and pelvis. ?She was incidentally noted to have extensive multifocal bilateral groundglass opacities with interstitial thickening thought to be reflecting an infectious or inflammatory etiology. ?Patulous thoracic esophagus with retained/reflux contrast material in the esophagus with mild esophageal wall thickening suggesting esophagitis. ?Mild hepatomegaly and fatty liver. ? ? ?MEDICAL HISTORY:  ?Past Medical History:  ?Diagnosis Date  ? Allergy   ? year around allergies  ? Arthritis   ? fingers, knees, neck, back-osteoarthristis  ? Bronchitis   ? hx of  ? Complication of anesthesia   ? 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 GERD (gastroesophageal reflux disease)   ? hx of reflux-went away with elevating HOB   ? Hypertension   ? Pneumonia   ? hx of   ? PONV (postoperative nausea and vomiting)   ? ? ?SURGICAL HISTORY: ?Past Surgical History:  ?Procedure Laterality Date  ? ABDOMINAL HYSTERECTOMY    ? ADENOIDECTOMY    ? cosmetic surgeries    ? ESOPHAGEAL MANOMETRY N/A 12/02/2015  ? Procedure: ESOPHAGEAL MANOMETRY (EM);  Surgeon: [name], MD;  Location: [facility];  Service: Endoscopy;  Laterality: N/A;  ? ESOPHAGOGASTRODUODENOSCOPY N/A 12/02/2015  ? Procedure: ESOPHAGOGASTRODUODENOSCOPY (EGD);  Surgeon: [name], MD;  Location: [facility];  Service: Endoscopy;  Laterality: N/A;  ? lymph node removal left leg    ? cat scratch surgery   ? THROAT SURGERY    ? sleep apnea  surgery   ? TONSILLECTOMY    ? ? ?SOCIAL HISTORY: ?Social History  ? ?Socioeconomic History  ? Marital status: Married  ?  Spouse name: Not on file  ? Number of children: Not on file  ? Years of education: Not on file  ? Highest education level: Not on file  ?Occupational History  ? Not on file  ?Tobacco Use  ? Smoking status: Never  ? Smokeless tobacco: Never  ?Vaping Use  ? Vaping Use: Never used  ?Substance and Sexual Activity  ? Alcohol use: No  ?  Alcohol/week: 0.0 standard drinks  ? Drug use: No  ? Sexual activity: Not on file  ?Other Topics Concern  ? Not on file  ?Social History Narrative  ? 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 Never smoker never drinker  ? No drugs  ? Multiple allergies  ? Lives at home with her husband of 8 years since 2008  ? ?Social Determinants of Health  ? ?Financial Resource Strain: Not on file  ?Food Insecurity: Not on file  ?Transportation Needs: Not on file  ?Physical Activity: Not on file  ?Stress: Not on file  ?Social Connections: Not on file  ?Intimate Partner Violence: Not on file  ? ? ?FAMILY HISTORY: ?Family History  ?Problem Relation Age of Onset  ? Lung cancer Father   ?     1982 died from it  ? Hernia Mother   ? Breast cancer Maternal Aunt   ?     multiple   ? Breast cancer Maternal Grandmother   ? Breast cancer Paternal Grandmother   ? Breast cancer Cousin   ?     cousins multiple   ? Colon cancer Neg Hx   ? Colon polyps Neg Hx   ? Rectal cancer Neg Hx   ? Stomach cancer Neg Hx   ? ? ?ALLERGIES:  is allergic to latex, codeine, atenolol, other, hydrocodone, and oxycodone. ? ?MEDICATIONS:  ?Current Outpatient Medications  ?Medication Sig Dispense Refill  ? levofloxacin (LEVAQUIN) 750 MG tablet Take 1 tablet (750 mg total) by mouth daily. 5 tablet 0  ? predniSONE (DELTASONE) 5 MG tablet Take 4 tablets (20 mg total) by mouth daily with breakfast for 2 days, THEN 2 tablets (10 mg total) daily with breakfast for 2 days, THEN 1 tablet (5  mg total) daily with breakfast for 3 days. 15 tablet 0  ? Saccharomyces boulardii 400 MG CAPS Take 1 capsule by mouth in the morning and at bedtime for 10 days. 20 capsule 0  ? acyclovir (ZOVIRAX) 400 MG tablet TAKE 1 TABLET(400 MG) BY MOUTH DAILY 30 tablet 6  ? amLODipine (NORVASC) 10 MG tablet Take 10 mg by mouth daily.    ? aspirin EC 81 MG tablet Take 81 mg by mouth daily.    ? diphenhydrAMINE (BENADRYL) 25 MG tablet Take 25 mg by mouth at bedtime.    ? lisinopril-hydrochlorothiazide (ZESTORETIC) 20-12.5 MG tablet Take 1 tablet by mouth daily.    ? losartan-hydrochlorothiazide (HYZAAR) 50-12.5 MG tablet Take 1 tablet by mouth daily.    ? Magnesium Hydroxide (DULCOLAX PO) Take 1 tablet by mouth 2 (two)  times daily.    ? Melatonin 10 MG CAPS Take 10 mg by mouth at bedtime.     ? meloxicam (MOBIC) 15 MG tablet Take 15 mg by mouth daily.    ? metFORMIN (GLUCOPHAGE) 500 MG tablet Take 500 mg by mouth daily.    ? metoprolol succinate (TOPROL-XL) 50 MG 24 hr tablet Take 50 mg by mouth daily.    ? Multiple Vitamin (MULTIVITAMIN) tablet Take 1 tablet by mouth daily.    ? omeprazole (PRILOSEC) 20 MG capsule Take 20 mg by mouth 2 (two) times daily.    ? rosuvastatin (CRESTOR) 10 MG tablet Take 10 mg by mouth daily.    ? venlafaxine (EFFEXOR) 37.5 MG tablet Take 37.5 mg by mouth once.    ? ?No current facility-administered medications for this visit.  ? ? ?REVIEW OF SYSTEMS: ?Fevers no chills no night sweats ?Persistent nonproductive cough. ?Dyspnea on exertion present ? ?PHYSICAL EXAMINATION: Telemedicine visit ? ?LABORATORY DATA:  ?I have reviewed the data as listed ? ? ?  Latest Ref Rng & Units 05/11/2021  ? 10:05 AM 02/25/2021  ? 10:56 AM 12/18/2020  ?  9:59 AM  ?CBC  ?WBC 4.0 - 10.5 K/uL 4.2   6.2   6.4    ?Hemoglobin 12.0 - 15.0 g/dL 12.6   13.2   13.8    ?Hematocrit 36.0 - 46.0 % 36.1   37.7   39.7    ?Platelets 150 - 400 K/uL 148   208   210    ? ? ? ?  Latest Ref Rng & Units 07/14/2021  ?  7:43 AM 05/11/2021  ? 10:05 AM  02/25/2021  ? 10:56 AM  ?CMP  ?Glucose 70 - 99 mg/dL  329   232    ?BUN 8 - 23 mg/dL  16   15    ?Creatinine 0.44 - 1.00 mg/dL 0.80   0.99   0.96    ?Sodium 135 - 145 mmol/L  137   139    ?Potassium 3.5

## 2021-07-27 ENCOUNTER — Ambulatory Visit (HOSPITAL_COMMUNITY)
Admission: RE | Admit: 2021-07-27 | Discharge: 2021-07-27 | Disposition: A | Discharge status: Home / Self Care | Payer: Medicare Other | Source: Ambulatory Visit | Attending: Hematology | Admitting: Hematology

## 2021-07-27 DIAGNOSIS — R918 Other nonspecific abnormal finding of lung field: Secondary | ICD-10-CM | POA: Insufficient documentation

## 2021-08-02 ENCOUNTER — Emergency Department (HOSPITAL_COMMUNITY): Payer: Medicare Other

## 2021-08-02 ENCOUNTER — Other Ambulatory Visit: Payer: Self-pay

## 2021-08-02 ENCOUNTER — Inpatient Hospital Stay (HOSPITAL_COMMUNITY)
Admission: EM | Admit: 2021-08-02 | Discharge: 2021-08-09 | DRG: 193 | Disposition: A | Discharge status: Home / Self Care | Payer: Medicare Other | Attending: Internal Medicine | Admitting: Internal Medicine

## 2021-08-02 ENCOUNTER — Encounter (HOSPITAL_COMMUNITY): Payer: Self-pay | Admitting: Emergency Medicine

## 2021-08-02 DIAGNOSIS — J154 Pneumonia due to other streptococci: Secondary | ICD-10-CM | POA: Diagnosis not present

## 2021-08-02 DIAGNOSIS — J479 Bronchiectasis, uncomplicated: Secondary | ICD-10-CM | POA: Diagnosis present

## 2021-08-02 DIAGNOSIS — E876 Hypokalemia: Secondary | ICD-10-CM

## 2021-08-02 DIAGNOSIS — R0602 Shortness of breath: Secondary | ICD-10-CM | POA: Diagnosis not present

## 2021-08-02 DIAGNOSIS — M1990 Unspecified osteoarthritis, unspecified site: Secondary | ICD-10-CM | POA: Diagnosis present

## 2021-08-02 DIAGNOSIS — E1165 Type 2 diabetes mellitus with hyperglycemia: Secondary | ICD-10-CM

## 2021-08-02 DIAGNOSIS — Z20822 Contact with and (suspected) exposure to covid-19: Secondary | ICD-10-CM | POA: Diagnosis present

## 2021-08-02 DIAGNOSIS — R0609 Other forms of dyspnea: Secondary | ICD-10-CM | POA: Diagnosis present

## 2021-08-02 DIAGNOSIS — I10 Essential (primary) hypertension: Secondary | ICD-10-CM | POA: Diagnosis not present

## 2021-08-02 DIAGNOSIS — Z90710 Acquired absence of both cervix and uterus: Secondary | ICD-10-CM

## 2021-08-02 DIAGNOSIS — K220 Achalasia of cardia: Secondary | ICD-10-CM | POA: Diagnosis not present

## 2021-08-02 DIAGNOSIS — K219 Gastro-esophageal reflux disease without esophagitis: Secondary | ICD-10-CM | POA: Diagnosis present

## 2021-08-02 DIAGNOSIS — Z888 Allergy status to other drugs, medicaments and biological substances status: Secondary | ICD-10-CM

## 2021-08-02 DIAGNOSIS — C8218 Follicular lymphoma grade II, lymph nodes of multiple sites: Secondary | ICD-10-CM | POA: Diagnosis not present

## 2021-08-02 DIAGNOSIS — R9389 Abnormal findings on diagnostic imaging of other specified body structures: Secondary | ICD-10-CM

## 2021-08-02 DIAGNOSIS — J9601 Acute respiratory failure with hypoxia: Secondary | ICD-10-CM | POA: Diagnosis not present

## 2021-08-02 DIAGNOSIS — R000 Tachycardia, unspecified: Secondary | ICD-10-CM | POA: Diagnosis present

## 2021-08-02 DIAGNOSIS — C8200 Follicular lymphoma grade I, unspecified site: Secondary | ICD-10-CM

## 2021-08-02 DIAGNOSIS — R0600 Dyspnea, unspecified: Secondary | ICD-10-CM | POA: Diagnosis present

## 2021-08-02 DIAGNOSIS — J302 Other seasonal allergic rhinitis: Secondary | ICD-10-CM | POA: Diagnosis present

## 2021-08-02 DIAGNOSIS — Z7982 Long term (current) use of aspirin: Secondary | ICD-10-CM

## 2021-08-02 DIAGNOSIS — Z91040 Latex allergy status: Secondary | ICD-10-CM

## 2021-08-02 DIAGNOSIS — Z791 Long term (current) use of non-steroidal anti-inflammatories (NSAID): Secondary | ICD-10-CM

## 2021-08-02 DIAGNOSIS — Z79899 Other long term (current) drug therapy: Secondary | ICD-10-CM

## 2021-08-02 DIAGNOSIS — Z9221 Personal history of antineoplastic chemotherapy: Secondary | ICD-10-CM

## 2021-08-02 DIAGNOSIS — D849 Immunodeficiency, unspecified: Secondary | ICD-10-CM | POA: Diagnosis present

## 2021-08-02 DIAGNOSIS — Z7984 Long term (current) use of oral hypoglycemic drugs: Secondary | ICD-10-CM

## 2021-08-02 DIAGNOSIS — Z885 Allergy status to narcotic agent status: Secondary | ICD-10-CM

## 2021-08-02 DIAGNOSIS — E119 Type 2 diabetes mellitus without complications: Secondary | ICD-10-CM

## 2021-08-02 HISTORY — DX: Type 2 diabetes mellitus without complications: E11.9

## 2021-08-02 LAB — CBC WITH DIFFERENTIAL/PLATELET
Abs Immature Granulocytes: 0.09 10*3/uL — ABNORMAL HIGH (ref 0.00–0.07)
Basophils Absolute: 0 10*3/uL (ref 0.0–0.1)
Basophils Relative: 0 %
Eosinophils Absolute: 0 10*3/uL (ref 0.0–0.5)
Eosinophils Relative: 0 %
HCT: 36.3 % (ref 36.0–46.0)
Hemoglobin: 11.3 g/dL — ABNORMAL LOW (ref 12.0–15.0)
Immature Granulocytes: 1 %
Lymphocytes Relative: 4 %
Lymphs Abs: 0.4 10*3/uL — ABNORMAL LOW (ref 0.7–4.0)
MCH: 26 pg (ref 26.0–34.0)
MCHC: 31.1 g/dL (ref 30.0–36.0)
MCV: 83.6 fL (ref 80.0–100.0)
Monocytes Absolute: 0.7 10*3/uL (ref 0.1–1.0)
Monocytes Relative: 9 %
Neutro Abs: 7.2 10*3/uL (ref 1.7–7.7)
Neutrophils Relative %: 86 %
Platelets: 340 10*3/uL (ref 150–400)
RBC: 4.34 MIL/uL (ref 3.87–5.11)
RDW: 14.7 % (ref 11.5–15.5)
WBC: 8.4 10*3/uL (ref 4.0–10.5)
nRBC: 0 % (ref 0.0–0.2)

## 2021-08-02 LAB — COMPREHENSIVE METABOLIC PANEL
ALT: 23 U/L (ref 0–44)
AST: 28 U/L (ref 15–41)
Albumin: 3 g/dL — ABNORMAL LOW (ref 3.5–5.0)
Alkaline Phosphatase: 76 U/L (ref 38–126)
Anion gap: 11 (ref 5–15)
BUN: 11 mg/dL (ref 8–23)
CO2: 28 mmol/L (ref 22–32)
Calcium: 9.1 mg/dL (ref 8.9–10.3)
Chloride: 98 mmol/L (ref 98–111)
Creatinine, Ser: 0.9 mg/dL (ref 0.44–1.00)
GFR, Estimated: >60 mL/min (ref >60)
Glucose, Bld: 237 mg/dL — ABNORMAL HIGH (ref 70–99)
Potassium: 3.1 mmol/L — ABNORMAL LOW (ref 3.5–5.1)
Sodium: 137 mmol/L (ref 135–145)
Total Bilirubin: 0.6 mg/dL (ref 0.3–1.2)
Total Protein: 6.5 g/dL (ref 6.5–8.1)

## 2021-08-02 LAB — RESPIRATORY PANEL BY PCR

## 2021-08-02 LAB — RESP PANEL BY RT-PCR (FLU A&B, COVID) ARPGX2
Influenza A by PCR: NEGATIVE
Influenza B by PCR: NEGATIVE
SARS Coronavirus 2 by RT PCR: NEGATIVE

## 2021-08-02 LAB — TROPONIN I (HIGH SENSITIVITY)
Troponin I (High Sensitivity): 5 ng/L (ref <18)
Troponin I (High Sensitivity): 4 ng/L (ref <18)

## 2021-08-02 LAB — LACTATE DEHYDROGENASE: LDH: 135 U/L (ref 98–192)

## 2021-08-02 LAB — PROTIME-INR
INR: 1.1 (ref 0.8–1.2)
Prothrombin Time: 14.1 seconds (ref 11.4–15.2)

## 2021-08-02 LAB — BRAIN NATRIURETIC PEPTIDE: B Natriuretic Peptide: 26.1 pg/mL (ref 0.0–100.0)

## 2021-08-02 LAB — D-DIMER, QUANTITATIVE: D-Dimer, Quant: 0.66 ug/mL-FEU — ABNORMAL HIGH (ref 0.00–0.50)

## 2021-08-02 LAB — SEDIMENTATION RATE: Sed Rate: 75 mm/hr — ABNORMAL HIGH (ref 0–22)

## 2021-08-02 MED ORDER — POTASSIUM CHLORIDE 10 MEQ/100ML IV SOLN
10.0000 meq | INTRAVENOUS | Status: AC
  Administered 2021-08-02 (×2): 10 meq via INTRAVENOUS
  Filled 2021-08-02 (×2): qty 100

## 2021-08-02 MED ORDER — INSULIN ASPART 100 UNIT/ML IJ SOLN
0.0000 [IU] | Freq: Three times a day (TID) | INTRAMUSCULAR | Status: DC
  Administered 2021-08-03 – 2021-08-04 (×4): 2 [IU] via SUBCUTANEOUS
  Administered 2021-08-04: 7 [IU] via SUBCUTANEOUS
  Administered 2021-08-04: 2 [IU] via SUBCUTANEOUS

## 2021-08-02 MED ORDER — GUAIFENESIN 100 MG/5ML PO LIQD
5.0000 mL | ORAL | Status: DC | PRN
  Administered 2021-08-07 – 2021-08-09 (×3): 5 mL via ORAL
  Filled 2021-08-02 (×4): qty 5

## 2021-08-02 MED ORDER — IOHEXOL 350 MG/ML SOLN
100.0000 mL | Freq: Once | INTRAVENOUS | Status: AC | PRN
  Administered 2021-08-02: 100 mL via INTRAVENOUS

## 2021-08-02 MED ORDER — ENOXAPARIN SODIUM 40 MG/0.4ML IJ SOSY
40.0000 mg | PREFILLED_SYRINGE | INTRAMUSCULAR | Status: DC
  Administered 2021-08-02 – 2021-08-08 (×7): 40 mg via SUBCUTANEOUS
  Filled 2021-08-02 (×7): qty 0.4

## 2021-08-02 NOTE — ED Notes (Signed)
Patient transported to CT 

## 2021-08-02 NOTE — Assessment & Plan Note (Signed)
Replete with IV potassium 10 mEq x 2 ?

## 2021-08-02 NOTE — Assessment & Plan Note (Addendum)
S/p Heller myotomy with fundoplication in 6815 at [facility]. Had plans for repeat of the procedure but this has been postponed due to recent pulmonary symptoms ?-Continue with thickened diet ?

## 2021-08-02 NOTE — ED Notes (Signed)
Pt ambulated to the bathroom. Husband at bedside.  ?

## 2021-08-02 NOTE — Assessment & Plan Note (Signed)
Uncontrolled with hyperglycemia ?-Placed on sensitive SSI ?

## 2021-08-02 NOTE — ED Provider Triage Note (Signed)
Emergency Medicine Provider Triage Evaluation Note ? ?Meghan Alexander , a 66 y.o. female  was evaluated in triage.  Pt complains of worsening SOB x 2 weeks. Pt with hx of follicular non hodkin's lymphoma. She had a CT scan on 03/22 to ensure she was in remission however the CT scan did show some abnormalities. She had a repeat CT scan done on 04/04 with findings concerning for possibility of pneumonia. Reports she has an appointment with a lung specialist at the end of the month however did not think she could wait that long. She has been monitoring her O2 sats at home and reports it was 84% earlier today. She also complains of night sweats and hemoptysis. ? ?03/22 ?IMPRESSION: ?1. No lymphadenopathy within the chest, abdomen, or pelvis. ?2. Extensive multifocal bilateral ground-glass opacities with ?interstitial thickening, favored to reflect an infectious or ?inflammatory etiology. Recommend attention on short-term dedicated ?follow-up chest CT. ?3. Patulous thoracic esophagus with retained versus refluxed ?contrast material in the esophagus and mild diffuse esophageal wall ?thickening, suggestive of esophagitis. ?4. Moderate volume of formed stool throughout the colon suggestive ?of constipation. ?5. Mild hepatomegaly and hepatic steatosis. ?6. Scattered colonic diverticulosis without findings of acute ?diverticulitis. ?7.  Aortic Atherosclerosis (ICD10-I70.0). ?  ?04/05 ?IMPRESSION: ?1. Worsening peribronchovascular ground-glass with associated ?bronchiectasis, minimal consolidation and scattered volume loss, ?findings which may be due to an atypical or viral pneumonia. ?Organizing pneumonia is another consideration. Drug related toxicity ?is considered unlikely given last dose November 2022. Consider ?consultation with pulmonary medicine. ?2. Patulous fluid-filled esophagus, consistent with known achalasia. ?3. Hepatic steatosis. ?4. Aortic atherosclerosis (ICD10-I70.0). Coronary artery ?calcification. ? ?Review  of Systems  ?Positive: + cough, coughing up blood, SOB, night sweats ?Negative: - fevers, chest pain ? ?Physical Exam  ?BP 115/85 (BP Location: Right Arm)   Pulse (!) 109   Temp 98 ?F (36.7 ?C) (Oral)   Resp 20   SpO2 94%  ?Gen:   Awake, no distress   ?Resp:  Normal effort  ?MSK:   Moves extremities without difficulty  ?Other:   ? ?Medical Decision Making  ?Medically screening exam initiated at 1:38 PM.  Appropriate orders placed.  Meghan Alexander was informed that the remainder of the evaluation will be completed by another provider, this initial triage assessment does not replace that evaluation, and the importance of remaining in the ED until their evaluation is complete. ? ?  ?Eustaquio Maize, PA-C ?08/02/21 1340 ? ?

## 2021-08-02 NOTE — Assessment & Plan Note (Signed)
Controlled.  Continue home antihypertensives once med rec is complete ?

## 2021-08-02 NOTE — H&P (View-Only) (Signed)
? ?NAME:  Meghan Alexander, MRN:  [id number], DOB:  1956-01-03, LOS: 0 ?ADMISSION DATE:  08/02/2021, CONSULTATION DATE:  08/02/21 ?REFERRING MD:  Melina Copa- EM, CHIEF COMPLAINT:  SOB ? ?History of Present Illness:  ? ?66 yo F PMH follicular lymphoma sp chemo, achalasia, who presented to [facility] 4/10 with progressive SOB, cough.  ?Patient has actually been referred to pulm for evaluation of SOB and abnormal CT scans and is scheduled for initial appointment 08/11/21-- In 07/14/2021 pt CT chest with GGOs, and repeat CT chest 07/27/21 with progression of GGOs.  ? ?Regarding ED presentation -- cough has been longstanding, SOB more recent over past month and progressive the last week. Associated chills, night sweats, back pain, fatigue over last week. Endorses wt loss (34lb over 3 mo) but attributes to achalasia. No fever, sick contacts, travel. She has had 3 recent courses of abx for possible PNA as etiology of GGO and SOB, without improvement in sx ? ?Follows with Dr. Irene Limbo in [place] -- completed bendamustin, rituximab and subsequent 75morituximab maintenance. No further maintenance planned right now ? ?PCCM is consulted in this setting  ? ?Pertinent  Medical History  ?Follicular lymphoma ?Achalasia  ?NASH ? ?Significant Hospital Events: ?Including procedures, antibiotic start and stop dates in addition to other pertinent events   ?4/10 ED for progressive SOB + new night sweats, and known GGOs on CT  ? ?Interim History / Subjective:  ?Consulted  ? ?Objective   ?Blood pressure 124/65, pulse 97, temperature 98 ?F (36.7 ?C), temperature source Oral, resp. rate (!) 23, SpO2 95 %. ?   ?   ?No intake or output data in the 24 hours ending 08/02/21 1625 ?There were no vitals filed for this visit. ? ?Examination: ?General: WDWN older adult F ?HENT: NCAT pink mm Glasses ?Lungs: CTAb. Even and unlabored  ?Cardiovascular: rrr s1s2  ?Abdomen: round ndnt  ?Extremities: no acute joint deformity. No cyanosis or clubbing ?Neuro: AAOx3 following  commands ?GU: defer ? ?Resolved Hospital Problem list   ? ? ?Assessment & Plan:  ? ?Abnormal CT chest ?-GGOs have progressed from 06/2021 CT to 07/2021 CT.  Timing wise, unlikely to be lung tox from rituximab (last given 02/2021, had tolerated well during tx)  ?-?slow progressing infection -- still consider pt to be immunocompromised and at risk for less common infections. In infection present, this likely explains her associated presenting sx. Back pain is related to cough.  ?P ?-NPO at midnight for bronch 08/03/21 ?-RVP, fungitell, LDH, legionella, strep pneumo, aspergillus, GBM, quant gold  ?-would hold off on abx and steroids  ?-PRN robutussin  ? ?Achalasia  ?-essentially a liquid diet  ? ?Follicular lymphoma s/p chemo and immunotherapy ?-completed chemo and immunotherapy  ? ?Back pain  ?-r/t cough ?P ?-lido patch  ? ?Best Practice (right click and "Reselect all SmartList Selections" daily)  ? ?Per primary  ? ?Labs   ?CBC: ?Recent Labs  ?Lab 08/02/21 ?1348  ?WBC 8.4  ?NEUTROABS 7.2  ?HGB 11.3*  ?HCT 36.3  ?MCV 83.6  ?PLT 340  ? ? ?Basic Metabolic Panel: ?Recent Labs  ?Lab 08/02/21 ?1348  ?NA 137  ?K 3.1*  ?CL 98  ?CO2 28  ?GLUCOSE 237*  ?BUN 11  ?CREATININE 0.90  ?CALCIUM 9.1  ? ?GFR: ?CrCl cannot be calculated (Unknown ideal weight.). ?Recent Labs  ?Lab 08/02/21 ?1348  ?WBC 8.4  ? ? ?Liver Function Tests: ?Recent Labs  ?Lab 08/02/21 ?1348  ?AST 28  ?ALT 23  ?ALKPHOS 76  ?BILITOT 0.6  ?  PROT 6.5  ?ALBUMIN 3.0*  ? ?No results for input(s): LIPASE, AMYLASE in the last 168 hours. ?No results for input(s): AMMONIA in the last 168 hours. ? ?ABG ?No results found for: PHART, PCO2ART, PO2ART, HCO3, TCO2, ACIDBASEDEF, O2SAT  ? ?Coagulation Profile: ?Recent Labs  ?Lab 08/02/21 ?1538  ?INR 1.1  ? ? ?Cardiac Enzymes: ?No results for input(s): CKTOTAL, CKMB, CKMBINDEX, TROPONINI in the last 168 hours. ? ?HbA1C: ?No results found for: HGBA1C ? ?CBG: ?No results for input(s): GLUCAP in the last 168 hours. ? ?Review of Systems:    ?Review of Systems  ?Constitutional:  Positive for chills, diaphoresis, malaise/fatigue and weight loss.  ?HENT: Negative.    ?Eyes: Negative.   ?Respiratory:  Positive for cough, shortness of breath and wheezing. Negative for hemoptysis.   ?Cardiovascular: Negative.   ?Gastrointestinal:  Positive for nausea.  ?     Dry heaving  ?Genitourinary: Negative.   ?Musculoskeletal:  Positive for back pain and myalgias.  ?Skin: Negative.   ? ? 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  ? ?Surgical History:  ? ?Past Surgical History:  ?Procedure Laterality Date  ? ABDOMINAL HYSTERECTOMY    ? ADENOIDECTOMY    ? cosmetic surgeries    ? ESOPHAGEAL MANOMETRY N/A 12/02/2015  ? Procedure: ESOPHAGEAL MANOMETRY (EM);  Surgeon: [name], MD;  Location: [facility];  Service: Endoscopy;  Laterality: N/A;  ? ESOPHAGOGASTRODUODENOSCOPY N/A 12/02/2015  ? Procedure: ESOPHAGOGASTRODUODENOSCOPY (EGD);  Surgeon: [name], MD;  Location: [facility];  Service: Endoscopy;  Laterality: N/A;  ? lymph node removal left leg    ? cat scratch surgery   ? THROAT SURGERY    ? sleep apnea surgery   ? TONSILLECTOMY    ?  ? ?Social History:  ? reports that she has never smoked. She has never used smokeless tobacco. She reports that she does not drink alcohol and does not use drugs.  ? ?Family History:  ?Her family history includes Breast cancer in her cousin, maternal aunt, maternal grandmother, and paternal grandmother; Hernia in her mother; Lung cancer in her father. There is no history of Colon cancer, Colon polyps, Rectal cancer, or Stomach cancer.  ? ?Allergies ?Allergies  ?Allergen Reactions  ? Latex Other (See Comments)  ?  Blisters / skin peeling ?Blisters / skin  peeling ?BLISTERS RASH   ? Codeine Nausea And Vomiting and Other (See Comments)  ?  hallucinations ?hallucinations ?HALLUCINATIONS.   ? Atenolol Cough  ? Other Other (See Comments)  ?  DermaPlast? Used after surgery - caused swelling, itching, and wound broke open  ? Hydrocodone Nausea And Vomiting  ? Oxycodone Nausea And Vomiting  ?  ? ?Home Medications  ?Prior to Admission medications   ?Medication Sig Start Date End Date Taking? Authorizing Provider  ?acyclovir (ZOVIRAX) 400 MG tablet TAKE 1 TABLET(400 MG) BY MOUTH DAILY 03/24/21   Brunetta Genera, MD  ?amLODipine (NORVASC) 10 MG tablet Take 10 mg by mouth daily. 12/14/20   [provider]  ?aspirin EC 81 MG tablet Take 81 mg by mouth daily. 05/09/19   Brunetta Genera, MD  ?diphenhydrAMINE (BENADRYL) 25 MG tablet Take 25 mg by mouth at bedtime.    [provider]  ?levofloxacin (LEVAQUIN) 750 MG tablet Take 1 tablet (750 mg total) by  mouth daily. 07/16/21   Brunetta Genera, MD  ?lisinopril-hydrochlorothiazide (ZESTORETIC) 20-12.5 MG tablet Take 1 tablet by mouth daily. 06/18/19   [provider]  ?losartan-hydrochlorothiazide (HYZAAR) 50-12.5 MG tablet Take 1 tablet by mouth daily. 12/14/20   [provider]  ?Magnesium Hydroxide (DULCOLAX PO) Take 1 tablet by mouth 2 (two) times daily.    [provider]  ?Melatonin 10 MG CAPS Take 10 mg by mouth at bedtime.     [provider]  ?meloxicam (MOBIC) 15 MG tablet Take 15 mg by mouth daily.    [provider]  ?metFORMIN (GLUCOPHAGE) 500 MG tablet Take 500 mg by mouth daily. 07/23/19   [provider]  ?metoprolol succinate (TOPROL-XL) 50 MG 24 hr tablet Take 50 mg by mouth daily. 12/21/15   [provider]  ?Multiple Vitamin (MULTIVITAMIN) tablet Take 1 tablet by mouth daily.    [provider]  ?omeprazole (PRILOSEC) 20 MG capsule Take 20 mg by mouth 2 (two) times daily. 11/28/20   [provider]   ?rosuvastatin (CRESTOR) 10 MG tablet Take 10 mg by mouth daily.    [provider]  ?venlafaxine (EFFEXOR) 37.5 MG tablet Take 37.5 mg by mouth once.    [provider]  ?  ? ?Critical care time: n/a ?  ? ? ? ?Gr

## 2021-08-02 NOTE — ED Provider Notes (Signed)
?[facility] ?Provider Note ? ? ?CSN: [id number] ?Arrival date & time: 08/02/21  1246 ? ?  ? ?History ? ?Chief Complaint  ?Patient presents with  ? Shortness of Breath  ? ? ?Meghan Alexander is a 66 y.o. female.  She has a history of non-Hodgkin's lymphoma and just finished her chemotherapy and immunotherapy.  She has had increased shortness of breath since January.  Progressively worsening.  Dyspnea on exertion .cough nonproductive.  Sometimes has some chest pain.  Also has pain in her upper back.  Associated with night sweats.  No fever.  Has had a CAT scan that showed groundglass opacities that is progressive from the most recent CAT scan last week.  She has a first-time appointment with pulmonary next week.  She has been on multiple courses of antibiotics and breathing treatments without any improvement.  Does have a history of seasonal allergies although does not feel like that is been bad this year.  Has had nausea with her chemotherapy but no vomiting no diarrhea no urinary symptoms.  She said she checked her oxygen saturations over the last week and its been in the 80s The history is provided by the patient and the spouse.  ?Shortness of Breath ?Severity:  Severe ?Onset quality:  Gradual ?Duration:  3 months ?Timing:  Intermittent ?Progression:  Worsening ?Chronicity:  New ?Relieved by:  Nothing ?Worsened by:  Activity ?Ineffective treatments:  Inhaler ?Associated symptoms: chest pain and cough   ?Associated symptoms: no abdominal pain, no diaphoresis, no fever, no hemoptysis, no sputum production, no vomiting and no wheezing   ? ?  ? ?Home Medications ?Prior to Admission medications   ?Medication Sig Start Date End Date Taking? Authorizing Provider  ?acyclovir (ZOVIRAX) 400 MG tablet TAKE 1 TABLET(400 MG) BY MOUTH DAILY 03/24/21   Brunetta Genera, MD  ?amLODipine (NORVASC) 10 MG tablet Take 10 mg by mouth daily. 12/14/20   [provider]  ?aspirin EC 81 MG  tablet Take 81 mg by mouth daily. 05/09/19   Brunetta Genera, MD  ?diphenhydrAMINE (BENADRYL) 25 MG tablet Take 25 mg by mouth at bedtime.    [provider]  ?levofloxacin (LEVAQUIN) 750 MG tablet Take 1 tablet (750 mg total) by mouth daily. 07/16/21   Brunetta Genera, MD  ?lisinopril-hydrochlorothiazide (ZESTORETIC) 20-12.5 MG tablet Take 1 tablet by mouth daily. 06/18/19   [provider]  ?losartan-hydrochlorothiazide (HYZAAR) 50-12.5 MG tablet Take 1 tablet by mouth daily. 12/14/20   [provider]  ?Magnesium Hydroxide (DULCOLAX PO) Take 1 tablet by mouth 2 (two) times daily.    [provider]  ?Melatonin 10 MG CAPS Take 10 mg by mouth at bedtime.     [provider]  ?meloxicam (MOBIC) 15 MG tablet Take 15 mg by mouth daily.    [provider]  ?metFORMIN (GLUCOPHAGE) 500 MG tablet Take 500 mg by mouth daily. 07/23/19   [provider]  ?metoprolol succinate (TOPROL-XL) 50 MG 24 hr tablet Take 50 mg by mouth daily. 12/21/15   [provider]  ?Multiple Vitamin (MULTIVITAMIN) tablet Take 1 tablet by mouth daily.    [provider]  ?omeprazole (PRILOSEC) 20 MG capsule Take 20 mg by mouth 2 (two) times daily. 11/28/20   [provider]  ?rosuvastatin (CRESTOR) 10 MG tablet Take 10 mg by mouth daily.    [provider]  ?venlafaxine (EFFEXOR) 37.5 MG tablet Take 37.5 mg by mouth once.    [provider]  ?   ? ?Allergies    ?Latex, Codeine, Atenolol, Other, Hydrocodone, and Oxycodone   ? ?Review of Systems   ?Review of Systems  ?Constitutional:  Negative for diaphoresis and fever.  ?Respiratory:  Positive for cough and shortness of breath. Negative for hemoptysis, sputum production and wheezing.   ?Cardiovascular:  Positive for chest pain.  ?Gastrointestinal:  Negative for abdominal pain and vomiting.  ?Musculoskeletal:  Positive for back pain.  ? ?Physical Exam ?Updated Vital Signs ?BP 112/75   Pulse  93   Temp 98 ?F (36.7 ?C) (Oral)   Resp 18   SpO2 95%  ?Physical Exam ?Vitals and nursing note reviewed.  ?Constitutional:   ?   General: She is not in acute distress. ?   Appearance: She is well-developed.  ?HENT:  ?   Head: Normocephalic and atraumatic.  ?Eyes:  ?   Conjunctiva/sclera: Conjunctivae normal.  ?Cardiovascular:  ?   Rate and Rhythm: Normal rate and regular rhythm.  ?   Heart sounds: No murmur heard. ?Pulmonary:  ?   Effort: Pulmonary effort is normal. No respiratory distress.  ?   Breath sounds: Normal breath sounds.  ?Abdominal:  ?   Palpations: Abdomen is soft.  ?   Tenderness: There is no abdominal tenderness.  ?Musculoskeletal:     ?   General: No swelling. Normal range of motion.  ?   Cervical back: Neck supple.  ?   Right lower leg: No tenderness. No edema.  ?   Left lower leg: No tenderness. No edema.  ?Skin: ?   General: Skin is warm and dry.  ?   Capillary Refill: Capillary refill takes less than 2 seconds.  ?Neurological:  ?   General: No focal deficit present.  ?   Mental Status: She is alert.  ? ? ?ED Results / Procedures / Treatments   ?Labs ?(all labs ordered are listed, but only abnormal results are displayed) ?Labs Reviewed  ?CBC WITH DIFFERENTIAL/PLATELET - Abnormal; Notable for the following components:  ?    Result Value  ? Hemoglobin 11.3 (*)   ? Lymphs Abs 0.4 (*)   ? Abs Immature Granulocytes 0.09 (*)   ? All other components within normal limits  ?COMPREHENSIVE METABOLIC PANEL - Abnormal; Notable for the following components:  ? Potassium 3.1 (*)   ? Glucose, Bld 237 (*)   ? Albumin 3.0 (*)   ? All other components within normal limits  ?D-DIMER, QUANTITATIVE - Abnormal; Notable for the following components:  ? D-Dimer, Quant 0.66 (*)   ? All other components within normal limits  ?SEDIMENTATION RATE - Abnormal; Notable for the following components:  ? Sed Rate 75 (*)   ? All other components within normal limits  ?BASIC METABOLIC PANEL - Abnormal; Notable for the following  components:  ? Potassium 2.9 (*)   ? Glucose, Bld 163 (*)   ? Calcium 8.4 (*)   ? All other components within normal limits  ?CBC WITH DIFFERENTIAL/PLATELET - Abnormal; Notable for the following components:  ? Hemoglobin 11.0 (*)   ? HCT 34.9 (*)   ? Lymphs Abs 0.5 (*)   ? Abs Immature Granulocytes 0.09 (*)   ? All other components within normal limits  ?CBG MONITORING, ED - Abnormal; Notable for the following components:  ? Glucose-Capillary 166 (*)   ? All other components within normal limits  ?CBG MONITORING, ED - Abnormal; Notable for the following components:  ? Glucose-Capillary 156 (*)   ? All  other components within normal limits  ?CULTURE, BLOOD (ROUTINE X 2)  ?CULTURE, BLOOD (ROUTINE X 2)  ?RESP PANEL BY RT-PCR (FLU A&B, COVID) ARPGX2  ?RESPIRATORY PANEL BY PCR  ?ASPERGILLUS ANTIGEN, BAL/SERUM  ?VIRUS CULTURE  ?FUNGUS CULTURE WITH STAIN  ?AEROBIC/ANAEROBIC CULTURE W GRAM STAIN (SURGICAL/DEEP WOUND)  ?ACID FAST CULTURE WITH REFLEXED SENSITIVITIES (MYCOBACTERIA)  ?ACID FAST SMEAR (AFB, MYCOBACTERIA)  ?BRAIN NATRIURETIC PEPTIDE  ?PROTIME-INR  ?LACTATE DEHYDROGENASE  ?MAGNESIUM  ?PHOSPHORUS  ?FUNGITELL, SERUM  ?HISTOPLASMA ANTIGEN, URINE  ?LEGIONELLA PNEUMOPHILA SEROGP 1 UR AG  ?STREP PNEUMONIAE URINARY ANTIGEN  ?GLOMERULAR BASEMENT MEMBRANE ANTIBODIES  ?QUANTIFERON-TB GOLD PLUS  ?HEMOGLOBIN A1C  ?BODY FLUID CELL COUNT WITH DIFFERENTIAL  ?COMPREHENSIVE METABOLIC PANEL  ?MAGNESIUM  ?PHOSPHORUS  ?CBC WITH DIFFERENTIAL/PLATELET  ?CYTOLOGY - NON PAP  ?CYTOLOGY - NON PAP  ?TROPONIN I (HIGH SENSITIVITY)  ?TROPONIN I (HIGH SENSITIVITY)  ? ? ?EKG ?EKG Interpretation ? ?Date/Time:  Monday August 02 2021 13:16:09 EDT ?Ventricular Rate:  98 ?PR Interval:  146 ?QRS Duration: 80 ?QT Interval:  352 ?QTC Calculation: 449 ?R Axis:   56 ?Text Interpretation: Normal sinus rhythm Normal ECG When compared with ECG of 02-May-2019 12:26, No significant change since last tracing Confirmed by Aletta Edouard [phone]) on 08/02/2021  2:59:25 PM ? ?Radiology ?DG Chest 2 View ? ?Result Date: 08/02/2021 ?CLINICAL DATA:  Cough. EXAM: CHEST - 2 VIEW COMPARISON:  Chest XR, most recently 10/15/2015. CT chest, 07/27/2021. FINDINGS: Cardiomediastinal

## 2021-08-02 NOTE — H&P (Signed)
?History and Physical  ? ? ?Patient: Meghan Alexander WER:[id number] DOB: Jun 23, 1955 ?DOA: 08/02/2021 ?DOS: the patient was seen and examined on 08/02/2021 ?PCP: [name], MD  ?Patient coming from: Home ? ?Chief Complaint:  ?Chief Complaint  ?Patient presents with  ? Shortness of Breath  ? ?HPI: Meghan Alexander is a 66 y.o. female with medical history significant of Stage III/IV follicular lymphoma completed chemotherapy and immunotherapy, achalasia with chronic nausea S/p Heller myotomy with fundoplication in 1950, HTN, uncontrolled Type 2 diabetes who presents with worsening dyspnea on exertion and hypoxia.  ? ?Pt had symptoms of pneumonia in 04/2021 and treated with Keflex by PCP but continued to have persistent symptom of shortness of breath with exertion and nonproductive cough.  She later had a virtual visit for continued symptoms and was treated for allergies with no relief. She then had CT chest/abd/pelvis imaging obtained by oncology for lymphoma surveillance on 07/14/21 had incidental finding bilateral extensive multifocal ground glass opacity with interstitial thickening. 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 Referral was also made to pulmonary with appointment later this week.  ? ?She had repeat CT chest without contrast on 4/4 by GI to assess for a planned heller myotomy surgery for her achalasia and showed worsening peribronchovascular groundglass with associated bronchiectasis, minimal consolidation and scattered volume loss, finding which may be due to an atypical or viral pneumonia.  This was treated again with another course of antibiotics by her PCP.  However she continued to have worsening symptoms and this week also noted intermittent anterior chest pain and upper back pain. Night sweats daily requiring her to change clothes 3x in the night. Has noticed blood when blowing her nose.  Has had drops in her oxygen  saturation from 82 to 94% and decided to present to the ED. ?She denies history of tobacco use.  No recent travels or sick contacts. ? ?In the ED, she was afebrile normotensive and stable on room air.  No leukocytosis with mild anemia with hemoglobin 11.3.  Sodium 137, K of 3.1, glucose of 237, normal creatinine of 0.9.  Sed rate was elevated at 75, D-dimer mildly elevated 0.66, negative influenza PCR and COVID PCR. ?Pulmonology was consulted by ED physician who has plans to take her for bronchoscopy tomorrow. ? ?Review of Systems: As mentioned in the history of present illness. All other systems reviewed and are negative. ?Past Medical History:  ?Diagnosis Date  ? Allergy   ? year around allergies  ? Arthritis   ? fingers, knees, neck, back-osteoarthristis  ? Bronchitis   ? hx of  ? Complication of anesthesia   ? 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 GERD (gastroesophageal reflux disease)   ? hx of reflux-went away with elevating HOB   ? Hypertension   ? Pneumonia   ? hx of   ? PONV (postoperative nausea and vomiting)   ? Type 2 diabetes mellitus ([place]) 08/02/2021  ? ?Past Surgical History:  ?Procedure Laterality Date  ? ABDOMINAL HYSTERECTOMY    ? ADENOIDECTOMY    ? cosmetic surgeries    ? ESOPHAGEAL MANOMETRY N/A 12/02/2015  ? Procedure: ESOPHAGEAL MANOMETRY (EM);  Surgeon: [name], MD;  Location: [facility];  Service: Endoscopy;  Laterality: N/A;  ? ESOPHAGOGASTRODUODENOSCOPY N/A 12/02/2015  ? Procedure: ESOPHAGOGASTRODUODENOSCOPY (EGD);  Surgeon: [name], MD;  Location: [facility];  Service: Endoscopy;  Laterality: N/A;  ? lymph node  removal left leg    ? cat scratch surgery   ? THROAT SURGERY    ? sleep apnea surgery   ? TONSILLECTOMY    ? ?Social History:  reports that she has never smoked. She has never used smokeless tobacco. She reports that she does not drink alcohol and does not use drugs. ? ?Allergies  ?Allergen Reactions  ? Latex Other (See  Comments)  ?  Blisters / skin peeling ?Blisters / skin peeling ?BLISTERS RASH   ? Codeine Nausea And Vomiting and Other (See Comments)  ?  hallucinations ?hallucinations ?HALLUCINATIONS.   ? Atenolol Cough  ? Other Other (See Comments)  ?  DermaPlast? Used after surgery - caused swelling, itching, and wound broke open  ? Hydrocodone Nausea And Vomiting  ? Oxycodone Nausea And Vomiting  ? ? ?Family History  ?Problem Relation Age of Onset  ? Lung cancer Father   ?     1982 died from it  ? Hernia Mother   ? Breast cancer Maternal Aunt   ?     multiple   ? Breast cancer Maternal Grandmother   ? Breast cancer Paternal Grandmother   ? Breast cancer Cousin   ?     cousins multiple   ? Colon cancer Neg Hx   ? Colon polyps Neg Hx   ? Rectal cancer Neg Hx   ? Stomach cancer Neg Hx   ? ? ?Prior to Admission medications   ?Medication Sig Start Date End Date Taking? Authorizing Provider  ?acyclovir (ZOVIRAX) 400 MG tablet TAKE 1 TABLET(400 MG) BY MOUTH DAILY 03/24/21   Brunetta Genera, MD  ?amLODipine (NORVASC) 10 MG tablet Take 10 mg by mouth daily. 12/14/20   [provider]  ?aspirin EC 81 MG tablet Take 81 mg by mouth daily. 05/09/19   Brunetta Genera, MD  ?diphenhydrAMINE (BENADRYL) 25 MG tablet Take 25 mg by mouth at bedtime.    [provider]  ?levofloxacin (LEVAQUIN) 750 MG tablet Take 1 tablet (750 mg total) by mouth daily. 07/16/21   Brunetta Genera, MD  ?lisinopril-hydrochlorothiazide (ZESTORETIC) 20-12.5 MG tablet Take 1 tablet by mouth daily. 06/18/19   [provider]  ?losartan-hydrochlorothiazide (HYZAAR) 50-12.5 MG tablet Take 1 tablet by mouth daily. 12/14/20   [provider]  ?Magnesium Hydroxide (DULCOLAX PO) Take 1 tablet by mouth 2 (two) times daily.    [provider]  ?Melatonin 10 MG CAPS Take 10 mg by mouth at bedtime.     [provider]  ?meloxicam (MOBIC) 15 MG tablet Take 15 mg by mouth daily.    [provider]  ?metFORMIN  (GLUCOPHAGE) 500 MG tablet Take 500 mg by mouth daily. 07/23/19   [provider]  ?metoprolol succinate (TOPROL-XL) 50 MG 24 hr tablet Take 50 mg by mouth daily. 12/21/15   [provider]  ?Multiple Vitamin (MULTIVITAMIN) tablet Take 1 tablet by mouth daily.    [provider]  ?omeprazole (PRILOSEC) 20 MG capsule Take 20 mg by mouth 2 (two) times daily. 11/28/20   [provider]  ?rosuvastatin (CRESTOR) 10 MG tablet Take 10 mg by mouth daily.    [provider]  ?venlafaxine (EFFEXOR) 37.5 MG tablet Take 37.5 mg by mouth once.    [provider]  ? ? ?Physical Exam: ?Vitals:  ? 08/02/21 1645 08/02/21 1700 08/02/21 1715 08/02/21 1745  ?BP: 127/81 122/77 128/74 (!) 142/84  ?Pulse: 90 92 90 97  ?Resp: 17 (!) 23 (!)  27 19  ?Temp:      ?TempSrc:      ?SpO2: 95% 93% 93% 93%  ? ?Constitutional: NAD, calm, comfortable, well-appearing female appearing younger than stated age sitting upright in bed ?Eyes:  lids and conjunctivae normal ?ENMT: Mucous membranes are moist.  ?Neck: normal, supple ?Respiratory: Bibasilar crackles but no wheezing, Normal respiratory effort at rest on room air. No accessory muscle use.  ?Cardiovascular: Regular rate and rhythm, no murmurs / rubs / gallops. No extremity edema.  ?Abdomen: no tenderness, Bowel sounds positive.  ?Musculoskeletal: no clubbing / cyanosis. No joint deformity upper and lower extremities. Good ROM, no contractures. Normal muscle tone.  ?Skin: no rashes, lesions, ulcers.  ?Neurologic: CN 2-12 grossly intact. Strength 5/5 in all 4.  ?Psychiatric: Normal judgment and insight. Alert and oriented x 3.  Pleasant mood and smiling. ?Data Reviewed: ? ?See HPI ? ?Assessment and Plan: ?Acute respiratory failure with hypoxia ([place]) ?Presented with hypoxia down to 84% with ambulation but stable at rest.  Had symptoms of persistent exertional dyspnea with nonproductive cough since January and has been treated on 3 different courses of  antibiotics.  Had CT chest imaging on 07/14/2021 showing extensive bilateral multifocal groundglass opacity with interstitial thickening.  CT chest without contrast on 4/4 showed worsening peribronchovascular groundglass

## 2021-08-02 NOTE — ED Triage Notes (Signed)
Patient coming from home, complaint of shortness of breath. Pt endorses hx cancer, had CT on 3/22 that showed ground-glass opacities in lungs, another CT 4/5 showed worsening.  ?

## 2021-08-02 NOTE — Assessment & Plan Note (Addendum)
Presented with hypoxia down to 84% with ambulation but stable at rest.  Had symptoms of persistent exertional dyspnea with nonproductive cough since January and has been treated on 3 different courses of antibiotics.  Had CT chest imaging on 07/14/2021 showing extensive bilateral multifocal groundglass opacity with interstitial thickening.  CT chest without contrast on 4/4 showed worsening peribronchovascular groundglass with associated bronchiectasis.  Originally had outpatient pulmonology follow-up this week. ? ?-Pulmonology consulted and has obtained Quantiferon gold, glomerular basement member antibiotics, aspergillus Ag, Fungitell, strep pneumoniae urine antigen, Legionella pneumo and histoplasm antigen ?-planned bronchoscopy tomorrow with Dr. Wallene Dales.  N.p.o. past midnight ?-No antibiotics or steroids per pulmonology ?- Flu/COVID PCR negative.  Respiratory viral panel negative. ?

## 2021-08-02 NOTE — Consult Note (Addendum)
? ?NAME:  Meghan Alexander, MRN:  [id number], DOB:  10/02/1955, LOS: 0 ?ADMISSION DATE:  08/02/2021, CONSULTATION DATE:  08/02/21 ?REFERRING MD:  Melina Copa- EM, CHIEF COMPLAINT:  SOB ? ?History of Present Illness:  ? ?66 yo F PMH follicular lymphoma sp chemo, achalasia, who presented to [facility] 4/10 with progressive SOB, cough.  ?Patient has actually been referred to pulm for evaluation of SOB and abnormal CT scans and is scheduled for initial appointment 08/11/21-- In 07/14/2021 pt CT chest with GGOs, and repeat CT chest 07/27/21 with progression of GGOs.  ? ?Regarding ED presentation -- cough has been longstanding, SOB more recent over past month and progressive the last week. Associated chills, night sweats, back pain, fatigue over last week. Endorses wt loss (34lb over 3 mo) but attributes to achalasia. No fever, sick contacts, travel. She has had 3 recent courses of abx for possible PNA as etiology of GGO and SOB, without improvement in sx ? ?Follows with Dr. Irene Limbo in [place] -- completed bendamustin, rituximab and subsequent 36morituximab maintenance. No further maintenance planned right now ? ?PCCM is consulted in this setting  ? ?Pertinent  Medical History  ?Follicular lymphoma ?Achalasia  ?NASH ? ?Significant Hospital Events: ?Including procedures, antibiotic start and stop dates in addition to other pertinent events   ?4/10 ED for progressive SOB + new night sweats, and known GGOs on CT  ? ?Interim History / Subjective:  ?Consulted  ? ?Objective   ?Blood pressure 124/65, pulse 97, temperature 98 ?F (36.7 ?C), temperature source Oral, resp. rate (!) 23, SpO2 95 %. ?   ?   ?No intake or output data in the 24 hours ending 08/02/21 1625 ?There were no vitals filed for this visit. ? ?Examination: ?General: WDWN older adult F ?HENT: NCAT pink mm Glasses ?Lungs: CTAb. Even and unlabored  ?Cardiovascular: rrr s1s2  ?Abdomen: round ndnt  ?Extremities: no acute joint deformity. No cyanosis or clubbing ?Neuro: AAOx3 following  commands ?GU: defer ? ?Resolved Hospital Problem list   ? ? ?Assessment & Plan:  ? ?Abnormal CT chest ?-GGOs have progressed from 06/2021 CT to 07/2021 CT.  Timing wise, unlikely to be lung tox from rituximab (last given 02/2021, had tolerated well during tx)  ?-?slow progressing infection -- still consider pt to be immunocompromised and at risk for less common infections. In infection present, this likely explains her associated presenting sx. Back pain is related to cough.  ?P ?-NPO at midnight for bronch 08/03/21 ?-RVP, fungitell, LDH, legionella, strep pneumo, aspergillus, GBM, quant gold  ?-would hold off on abx and steroids  ?-PRN robutussin  ? ?Achalasia  ?-essentially a liquid diet  ? ?Follicular lymphoma s/p chemo and immunotherapy ?-completed chemo and immunotherapy  ? ?Back pain  ?-r/t cough ?P ?-lido patch  ? ?Best Practice (right click and "Reselect all SmartList Selections" daily)  ? ?Per primary  ? ?Labs   ?CBC: ?Recent Labs  ?Lab 08/02/21 ?1348  ?WBC 8.4  ?NEUTROABS 7.2  ?HGB 11.3*  ?HCT 36.3  ?MCV 83.6  ?PLT 340  ? ? ?Basic Metabolic Panel: ?Recent Labs  ?Lab 08/02/21 ?1348  ?NA 137  ?K 3.1*  ?CL 98  ?CO2 28  ?GLUCOSE 237*  ?BUN 11  ?CREATININE 0.90  ?CALCIUM 9.1  ? ?GFR: ?CrCl cannot be calculated (Unknown ideal weight.). ?Recent Labs  ?Lab 08/02/21 ?1348  ?WBC 8.4  ? ? ?Liver Function Tests: ?Recent Labs  ?Lab 08/02/21 ?1348  ?AST 28  ?ALT 23  ?ALKPHOS 76  ?BILITOT 0.6  ?  PROT 6.5  ?ALBUMIN 3.0*  ? ?No results for input(s): LIPASE, AMYLASE in the last 168 hours. ?No results for input(s): AMMONIA in the last 168 hours. ? ?ABG ?No results found for: PHART, PCO2ART, PO2ART, HCO3, TCO2, ACIDBASEDEF, O2SAT  ? ?Coagulation Profile: ?Recent Labs  ?Lab 08/02/21 ?1538  ?INR 1.1  ? ? ?Cardiac Enzymes: ?No results for input(s): CKTOTAL, CKMB, CKMBINDEX, TROPONINI in the last 168 hours. ? ?HbA1C: ?No results found for: HGBA1C ? ?CBG: ?No results for input(s): GLUCAP in the last 168 hours. ? ?Review of Systems:    ?Review of Systems  ?Constitutional:  Positive for chills, diaphoresis, malaise/fatigue and weight loss.  ?HENT: Negative.    ?Eyes: Negative.   ?Respiratory:  Positive for cough, shortness of breath and wheezing. Negative for hemoptysis.   ?Cardiovascular: Negative.   ?Gastrointestinal:  Positive for nausea.  ?     Dry heaving  ?Genitourinary: Negative.   ?Musculoskeletal:  Positive for back pain and myalgias.  ?Skin: Negative.   ? ? 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  ? ?Surgical History:  ? ?Past Surgical History:  ?Procedure Laterality Date  ? ABDOMINAL HYSTERECTOMY    ? ADENOIDECTOMY    ? cosmetic surgeries    ? ESOPHAGEAL MANOMETRY N/A 12/02/2015  ? Procedure: ESOPHAGEAL MANOMETRY (EM);  Surgeon: [name], MD;  Location: [facility];  Service: Endoscopy;  Laterality: N/A;  ? ESOPHAGOGASTRODUODENOSCOPY N/A 12/02/2015  ? Procedure: ESOPHAGOGASTRODUODENOSCOPY (EGD);  Surgeon: [name], MD;  Location: [facility];  Service: Endoscopy;  Laterality: N/A;  ? lymph node removal left leg    ? cat scratch surgery   ? THROAT SURGERY    ? sleep apnea surgery   ? TONSILLECTOMY    ?  ? ?Social History:  ? reports that she has never smoked. She has never used smokeless tobacco. She reports that she does not drink alcohol and does not use drugs.  ? ?Family History:  ?Her family history includes Breast cancer in her cousin, maternal aunt, maternal grandmother, and paternal grandmother; Hernia in her mother; Lung cancer in her father. There is no history of Colon cancer, Colon polyps, Rectal cancer, or Stomach cancer.  ? ?Allergies ?Allergies  ?Allergen Reactions  ? Latex Other (See Comments)  ?  Blisters / skin peeling ?Blisters / skin  peeling ?BLISTERS RASH   ? Codeine Nausea And Vomiting and Other (See Comments)  ?  hallucinations ?hallucinations ?HALLUCINATIONS.   ? Atenolol Cough  ? Other Other (See Comments)  ?  DermaPlast? Used after surgery - caused swelling, itching, and wound broke open  ? Hydrocodone Nausea And Vomiting  ? Oxycodone Nausea And Vomiting  ?  ? ?Home Medications  ?Prior to Admission medications   ?Medication Sig Start Date End Date Taking? Authorizing Provider  ?acyclovir (ZOVIRAX) 400 MG tablet TAKE 1 TABLET(400 MG) BY MOUTH DAILY 03/24/21   Brunetta Genera, MD  ?amLODipine (NORVASC) 10 MG tablet Take 10 mg by mouth daily. 12/14/20   [provider]  ?aspirin EC 81 MG tablet Take 81 mg by mouth daily. 05/09/19   Brunetta Genera, MD  ?diphenhydrAMINE (BENADRYL) 25 MG tablet Take 25 mg by mouth at bedtime.    [provider]  ?levofloxacin (LEVAQUIN) 750 MG tablet Take 1 tablet (750 mg total) by  mouth daily. 07/16/21   Brunetta Genera, MD  ?lisinopril-hydrochlorothiazide (ZESTORETIC) 20-12.5 MG tablet Take 1 tablet by mouth daily. 06/18/19   [provider]  ?losartan-hydrochlorothiazide (HYZAAR) 50-12.5 MG tablet Take 1 tablet by mouth daily. 12/14/20   [provider]  ?Magnesium Hydroxide (DULCOLAX PO) Take 1 tablet by mouth 2 (two) times daily.    [provider]  ?Melatonin 10 MG CAPS Take 10 mg by mouth at bedtime.     [provider]  ?meloxicam (MOBIC) 15 MG tablet Take 15 mg by mouth daily.    [provider]  ?metFORMIN (GLUCOPHAGE) 500 MG tablet Take 500 mg by mouth daily. 07/23/19   [provider]  ?metoprolol succinate (TOPROL-XL) 50 MG 24 hr tablet Take 50 mg by mouth daily. 12/21/15   [provider]  ?Multiple Vitamin (MULTIVITAMIN) tablet Take 1 tablet by mouth daily.    [provider]  ?omeprazole (PRILOSEC) 20 MG capsule Take 20 mg by mouth 2 (two) times daily. 11/28/20   [provider]   ?rosuvastatin (CRESTOR) 10 MG tablet Take 10 mg by mouth daily.    [provider]  ?venlafaxine (EFFEXOR) 37.5 MG tablet Take 37.5 mg by mouth once.    [provider]  ?  ? ?Critical care time: n/a ?  ? ? ? ?Gr

## 2021-08-02 NOTE — Assessment & Plan Note (Signed)
Follows with Dr. Irene Limbo with oncology ?-Completed Bendamustine Rituxan chemotherapy followed by 18 months of Rituxan maintenance with last dose 02/24/2021. No clinical or radiographic evidence of persistent follicular lymphoma as of recent scans 07/15/2020 ?

## 2021-08-03 ENCOUNTER — Observation Stay (HOSPITAL_COMMUNITY): Payer: Medicare Other | Admitting: Certified Registered Nurse Anesthetist

## 2021-08-03 ENCOUNTER — Encounter (HOSPITAL_COMMUNITY)
Admission: EM | Disposition: A | Discharge status: Home / Self Care | Payer: Self-pay | Source: Home / Self Care | Attending: Internal Medicine

## 2021-08-03 ENCOUNTER — Observation Stay (HOSPITAL_COMMUNITY): Payer: Medicare Other

## 2021-08-03 ENCOUNTER — Encounter (HOSPITAL_COMMUNITY): Payer: Self-pay | Admitting: Family Medicine

## 2021-08-03 DIAGNOSIS — Z7984 Long term (current) use of oral hypoglycemic drugs: Secondary | ICD-10-CM | POA: Diagnosis not present

## 2021-08-03 DIAGNOSIS — Z885 Allergy status to narcotic agent status: Secondary | ICD-10-CM | POA: Diagnosis not present

## 2021-08-03 DIAGNOSIS — I10 Essential (primary) hypertension: Secondary | ICD-10-CM

## 2021-08-03 DIAGNOSIS — K220 Achalasia of cardia: Secondary | ICD-10-CM | POA: Diagnosis present

## 2021-08-03 DIAGNOSIS — E1165 Type 2 diabetes mellitus with hyperglycemia: Secondary | ICD-10-CM | POA: Diagnosis present

## 2021-08-03 DIAGNOSIS — C8200 Follicular lymphoma grade I, unspecified site: Secondary | ICD-10-CM | POA: Diagnosis not present

## 2021-08-03 DIAGNOSIS — Z9221 Personal history of antineoplastic chemotherapy: Secondary | ICD-10-CM | POA: Diagnosis not present

## 2021-08-03 DIAGNOSIS — R0609 Other forms of dyspnea: Secondary | ICD-10-CM

## 2021-08-03 DIAGNOSIS — K219 Gastro-esophageal reflux disease without esophagitis: Secondary | ICD-10-CM | POA: Diagnosis present

## 2021-08-03 DIAGNOSIS — Z888 Allergy status to other drugs, medicaments and biological substances status: Secondary | ICD-10-CM | POA: Diagnosis not present

## 2021-08-03 DIAGNOSIS — J154 Pneumonia due to other streptococci: Secondary | ICD-10-CM | POA: Diagnosis present

## 2021-08-03 DIAGNOSIS — J9601 Acute respiratory failure with hypoxia: Secondary | ICD-10-CM

## 2021-08-03 DIAGNOSIS — R918 Other nonspecific abnormal finding of lung field: Secondary | ICD-10-CM | POA: Diagnosis not present

## 2021-08-03 DIAGNOSIS — Z79899 Other long term (current) drug therapy: Secondary | ICD-10-CM | POA: Diagnosis not present

## 2021-08-03 DIAGNOSIS — C8218 Follicular lymphoma grade II, lymph nodes of multiple sites: Secondary | ICD-10-CM | POA: Diagnosis present

## 2021-08-03 DIAGNOSIS — G4730 Sleep apnea, unspecified: Secondary | ICD-10-CM

## 2021-08-03 DIAGNOSIS — M1990 Unspecified osteoarthritis, unspecified site: Secondary | ICD-10-CM | POA: Diagnosis present

## 2021-08-03 DIAGNOSIS — D849 Immunodeficiency, unspecified: Secondary | ICD-10-CM | POA: Diagnosis present

## 2021-08-03 DIAGNOSIS — J302 Other seasonal allergic rhinitis: Secondary | ICD-10-CM | POA: Diagnosis present

## 2021-08-03 DIAGNOSIS — J479 Bronchiectasis, uncomplicated: Secondary | ICD-10-CM | POA: Diagnosis present

## 2021-08-03 DIAGNOSIS — E876 Hypokalemia: Secondary | ICD-10-CM | POA: Diagnosis present

## 2021-08-03 DIAGNOSIS — R0600 Dyspnea, unspecified: Secondary | ICD-10-CM | POA: Diagnosis not present

## 2021-08-03 DIAGNOSIS — R0602 Shortness of breath: Secondary | ICD-10-CM | POA: Diagnosis present

## 2021-08-03 DIAGNOSIS — Z90710 Acquired absence of both cervix and uterus: Secondary | ICD-10-CM | POA: Diagnosis not present

## 2021-08-03 DIAGNOSIS — R000 Tachycardia, unspecified: Secondary | ICD-10-CM | POA: Diagnosis present

## 2021-08-03 DIAGNOSIS — Z7982 Long term (current) use of aspirin: Secondary | ICD-10-CM | POA: Diagnosis not present

## 2021-08-03 DIAGNOSIS — Z91040 Latex allergy status: Secondary | ICD-10-CM | POA: Diagnosis not present

## 2021-08-03 DIAGNOSIS — Z20822 Contact with and (suspected) exposure to covid-19: Secondary | ICD-10-CM | POA: Diagnosis present

## 2021-08-03 DIAGNOSIS — Z791 Long term (current) use of non-steroidal anti-inflammatories (NSAID): Secondary | ICD-10-CM | POA: Diagnosis not present

## 2021-08-03 HISTORY — PX: BRONCHIAL BRUSHINGS: SHX5108

## 2021-08-03 HISTORY — PX: VIDEO BRONCHOSCOPY: SHX5072

## 2021-08-03 HISTORY — PX: BRONCHIAL WASHINGS: SHX5105

## 2021-08-03 LAB — BODY FLUID CELL COUNT WITH DIFFERENTIAL
Eos, Fluid: 0 %
Lymphs, Fluid: 53 %
Monocyte-Macrophage-Serous Fluid: 29 % — ABNORMAL LOW (ref 50–90)
Neutrophil Count, Fluid: 18 % (ref 0–25)
Total Nucleated Cell Count, Fluid: 29 cu mm (ref 0–1000)

## 2021-08-03 LAB — CBC WITH DIFFERENTIAL/PLATELET
Abs Immature Granulocytes: 0.09 10*3/uL — ABNORMAL HIGH (ref 0.00–0.07)
Basophils Absolute: 0 10*3/uL (ref 0.0–0.1)
Basophils Relative: 0 %
Eosinophils Absolute: 0.1 10*3/uL (ref 0.0–0.5)
Eosinophils Relative: 1 %
HCT: 34.9 % — ABNORMAL LOW (ref 36.0–46.0)
Hemoglobin: 11 g/dL — ABNORMAL LOW (ref 12.0–15.0)
Immature Granulocytes: 1 %
Lymphocytes Relative: 7 %
Lymphs Abs: 0.5 10*3/uL — ABNORMAL LOW (ref 0.7–4.0)
MCH: 26.4 pg (ref 26.0–34.0)
MCHC: 31.5 g/dL (ref 30.0–36.0)
MCV: 83.7 fL (ref 80.0–100.0)
Monocytes Absolute: 0.8 10*3/uL (ref 0.1–1.0)
Monocytes Relative: 10 %
Neutro Abs: 6.3 10*3/uL (ref 1.7–7.7)
Neutrophils Relative %: 81 %
Platelets: 329 10*3/uL (ref 150–400)
RBC: 4.17 MIL/uL (ref 3.87–5.11)
RDW: 14.7 % (ref 11.5–15.5)
WBC: 7.8 10*3/uL (ref 4.0–10.5)
nRBC: 0 % (ref 0.0–0.2)

## 2021-08-03 LAB — GLUCOSE, CAPILLARY: Glucose-Capillary: 153 mg/dL — ABNORMAL HIGH (ref 70–99)

## 2021-08-03 LAB — BASIC METABOLIC PANEL
Anion gap: 9 (ref 5–15)
BUN: 8 mg/dL (ref 8–23)
CO2: 30 mmol/L (ref 22–32)
Calcium: 8.4 mg/dL — ABNORMAL LOW (ref 8.9–10.3)
Chloride: 100 mmol/L (ref 98–111)
Creatinine, Ser: 0.73 mg/dL (ref 0.44–1.00)
GFR, Estimated: >60 mL/min (ref >60)
Glucose, Bld: 163 mg/dL — ABNORMAL HIGH (ref 70–99)
Potassium: 2.9 mmol/L — ABNORMAL LOW (ref 3.5–5.1)
Sodium: 139 mmol/L (ref 135–145)

## 2021-08-03 LAB — CBG MONITORING, ED
Glucose-Capillary: 166 mg/dL — ABNORMAL HIGH (ref 70–99)
Glucose-Capillary: 156 mg/dL — ABNORMAL HIGH (ref 70–99)

## 2021-08-03 LAB — PHOSPHORUS: Phosphorus: 3 mg/dL (ref 2.5–4.6)

## 2021-08-03 LAB — MAGNESIUM: Magnesium: 1.8 mg/dL (ref 1.7–2.4)

## 2021-08-03 SURGERY — BRONCHOSCOPY, WITH FLUOROSCOPY
Anesthesia: Monitor Anesthesia Care

## 2021-08-03 MED ORDER — LOSARTAN POTASSIUM 50 MG PO TABS
50.0000 mg | ORAL_TABLET | Freq: Every day | ORAL | Status: DC
  Administered 2021-08-03 – 2021-08-09 (×7): 50 mg via ORAL
  Filled 2021-08-03 (×7): qty 1

## 2021-08-03 MED ORDER — MAGNESIUM SULFATE 2 GM/50ML IV SOLN
2.0000 g | Freq: Once | INTRAVENOUS | Status: AC
Start: 2021-08-03 — End: 2021-08-03
  Administered 2021-08-03: 2 g via INTRAVENOUS
  Filled 2021-08-03: qty 50

## 2021-08-03 MED ORDER — LIDOCAINE HCL (PF) 1 % IJ SOLN
INTRAMUSCULAR | Status: AC
  Filled 2021-08-03: qty 30

## 2021-08-03 MED ORDER — POTASSIUM CHLORIDE 10 MEQ/100ML IV SOLN
10.0000 meq | INTRAVENOUS | Status: AC
  Administered 2021-08-03 (×4): 10 meq via INTRAVENOUS
  Filled 2021-08-03 (×5): qty 100

## 2021-08-03 MED ORDER — LIDOCAINE HCL 1 % IJ SOLN
INTRAMUSCULAR | Status: DC | PRN
  Administered 2021-08-03: 12 mL via INTRAPLEURAL

## 2021-08-03 MED ORDER — METOPROLOL SUCCINATE ER 50 MG PO TB24: 50.0000 mg | ORAL_TABLET | Freq: Every day | ORAL | Status: DC

## 2021-08-03 MED ORDER — LIDOCAINE HCL (PF) 4 % IJ SOLN
4.0000 mL | Freq: Once | INTRAMUSCULAR | Status: AC
  Administered 2021-08-03: 4 mL
  Filled 2021-08-03 (×2): qty 5

## 2021-08-03 MED ORDER — IPRATROPIUM-ALBUTEROL 0.5-2.5 (3) MG/3ML IN SOLN
3.0000 mL | Freq: Four times a day (QID) | RESPIRATORY_TRACT | Status: DC
  Administered 2021-08-03: 3 mL via RESPIRATORY_TRACT
  Filled 2021-08-03 (×2): qty 3

## 2021-08-03 MED ORDER — PROPOFOL 10 MG/ML IV BOLUS
INTRAVENOUS | Status: DC | PRN
  Administered 2021-08-03: 20 mg via INTRAVENOUS
  Administered 2021-08-03: 30 mg via INTRAVENOUS

## 2021-08-03 MED ORDER — POTASSIUM CHLORIDE 10 MEQ/100ML IV SOLN
10.0000 meq | INTRAVENOUS | Status: AC
  Administered 2021-08-03 (×5): 10 meq via INTRAVENOUS
  Filled 2021-08-03 (×5): qty 100

## 2021-08-03 MED ORDER — LOSARTAN POTASSIUM 50 MG PO TABS: 50.0000 mg | ORAL_TABLET | Freq: Every day | ORAL | Status: DC

## 2021-08-03 MED ORDER — METOPROLOL SUCCINATE ER 50 MG PO TB24
50.0000 mg | ORAL_TABLET | Freq: Every day | ORAL | Status: DC
  Administered 2021-08-03 – 2021-08-09 (×7): 50 mg via ORAL
  Filled 2021-08-03 (×7): qty 1

## 2021-08-03 MED ORDER — LACTATED RINGERS IV SOLN: INTRAVENOUS | Status: DC | PRN

## 2021-08-03 MED ORDER — MENTHOL 3 MG MT LOZG
1.0000 | LOZENGE | OROMUCOSAL | Status: DC | PRN
  Administered 2021-08-04 – 2021-08-06 (×3): 3 mg via ORAL
  Filled 2021-08-03 (×2): qty 9

## 2021-08-03 MED ORDER — POTASSIUM CHLORIDE 10 MEQ/100ML IV SOLN
10.0000 meq | Freq: Once | INTRAVENOUS | Status: AC
  Administered 2021-08-03: 10 meq via INTRAVENOUS
  Filled 2021-08-03: qty 100

## 2021-08-03 MED ORDER — PROPOFOL 500 MG/50ML IV EMUL
INTRAVENOUS | Status: DC | PRN
  Administered 2021-08-03: 75 ug/kg/min via INTRAVENOUS

## 2021-08-03 MED ORDER — ONDANSETRON HCL 4 MG/2ML IJ SOLN
INTRAMUSCULAR | Status: DC | PRN
Start: 2021-08-03 — End: 2021-08-03
  Administered 2021-08-03: 4 mg via INTRAVENOUS

## 2021-08-03 MED ORDER — POTASSIUM CHLORIDE 20 MEQ PO PACK
20.0000 meq | PACK | Freq: Two times a day (BID) | ORAL | Status: AC
  Administered 2021-08-03 – 2021-08-04 (×2): 20 meq via ORAL
  Filled 2021-08-03 (×2): qty 1

## 2021-08-03 NOTE — Op Note (Signed)
Video Bronchoscopy Procedure Note ? ?Date of Operation: 08/03/2021 ? ?Pre-op Diagnosis: Bilateral pulmonary infiltrates on chest CT ? ?Post-op Diagnosis: Same ? ?Surgeon: Baltazar Apo ? ?Assistants: none ? ?Anesthesia: Deep sedation with propofol ? ?Meds Given: Propofol per anesthesia record, 1% lidocaine 12 cc total 2 cords and neurosurgery ? ?Operation: Flexible video fiberoptic bronchoscopy and biopsies. ? ?Estimated Blood Loss: 0 cc ? ?Complications: none noted ? ?Indications and History: ?Meghan Alexander is 66 y.o. with history of follicular lymphoma previously on rituximab.  She has had progressive dyspnea and hypoxia, scattered wispy bilateral pulmonary infiltrates on imaging of unclear etiology.  Recommendation was to perform video fiberoptic bronchoscopy with BAL and brushings. The risks, benefits, complications, treatment options and expected outcomes were discussed with the patient.  The possibilities of pneumothorax, pneumonia, reaction to medication, pulmonary aspiration, perforation of a viscus, bleeding, failure to diagnose a condition and creating a complication requiring transfusion or operation were discussed with the patient who freely signed the consent.   ? ?Description of Procedure: ?The patient was seen in the Preoperative Area, was examined and was deemed appropriate to proceed.  The patient was taken to [facility] endoscopy room 3, identified as Meghan Alexander and the procedure verified as Flexible Video Fiberoptic Bronchoscopy.  A Time Out was held and the above information confirmed.  ? ?Conscious sedation was initiated as indicated above. The video fiberoptic bronchoscope was introduced via the oropharynx and a general inspection was performed which showed normal cords, normal trachea, normal main carina. The R sided airways were inspected and showed normal RUL, BI, RML and RLL. The L side was then inspected. The LLL, Lingular and LUL airways were normal.  ? ?BAL was performed in the right  middle lobe with 60 cc of normal saline instilled and approximately 15-20 cc returned.  Sent for microbiology, cytology, cell count. ? ?Under fluoroscopic guidance transbronchial brushings were performed in the right lower lobe for cytology. ? ?The patient tolerated the procedure well. The bronchoscope was removed. There were no obvious complications.  ? ?Samples: ?1.  Bronchoalveolar lavage from the right middle lobe ?2.  Transbronchial brushings from right lower lobe ? ? ?Plans:  ?Postprocedural chest x-ray is pending, will review.  We will review the cytology, pathology and microbiology results with the patient when they become available.  Patient will be transferred back to her hospital room when recovered from sedation. ? ? ?Baltazar Apo, MD, PhD ?08/03/2021, 2:12 PM ?[facility] ?[phone] or if no answer [phone] ? ?

## 2021-08-03 NOTE — Progress Notes (Signed)
Patient refused the 5th Potassium run. Her IV keeps on going off any time she bends her arm. She indicated the beeping is stressing her up. Called  the on call PCCM and talked to [employer] RN and she indicated to hold off the 5th infusion for the night till we check the labs in the morning. Will continue to monitor. ?

## 2021-08-03 NOTE — Anesthesia Preprocedure Evaluation (Signed)
Anesthesia Evaluation  ?Patient identified by MRN, date of birth, ID band ?Patient awake ? ? ? ?Reviewed: ?Allergy & Precautions, NPO status , Patient's Chart, lab work & pertinent test results, reviewed documented beta blocker date and time  ? ?History of Anesthesia Complications ?(+) PONV and history of anesthetic complications ? ?Airway ?Mallampati: III ? ?TM Distance: >3 FB ?Neck ROM: Full ? ? ? Dental ? ?(+) Teeth Intact, Dental Advisory Given ?  ?Pulmonary ?shortness of breath, sleep apnea , Recent URI ,  ?  ?Pulmonary exam normal ?breath sounds clear to auscultation ? ? ? ? ? ? Cardiovascular ?hypertension, Pt. on medications and Pt. on home beta blockers ?(-) angina(-) Past MI, (-) Cardiac Stents, (-) CABG and (-) Orthopnea  ?Rhythm:Regular Rate:Normal ? ? ?  ?Neuro/Psych ?neg Seizures negative neurological ROS ?   ? GI/Hepatic ?Neg liver ROS, GERD  Medicated,  ?Endo/Other  ?negative endocrine ROSdiabetes, Poorly Controlled ? Renal/GU ?negative Renal ROS  ? ?  ?Musculoskeletal ? ?(+) Arthritis ,  ? Abdominal ?(+) + obese,   ?Peds ? Hematology ?negative hematology ROS ?(+)   ?Anesthesia Other Findings ?Lymphoma ? Reproductive/Obstetrics ? ?  ? ? ? ? ? ? ? ? ? ? ? ? ? ?  ?  ? ? ? ? ? ? ? ? ?Anesthesia Physical ? ?Anesthesia Plan ? ?ASA: 3 ? ?Anesthesia Plan: MAC  ? ?Post-op Pain Management: Minimal or no pain anticipated  ? ?Induction: Intravenous ? ?PONV Risk Score and Plan: 3 and Ondansetron, Propofol infusion, Midazolam and Treatment may vary due to age or medical condition ? ?Airway Management Planned: Nasal Cannula ? ?Additional Equipment:  ? ?Intra-op Plan:  ? ?Post-operative Plan:  ? ?Informed Consent: I have reviewed the patients History and Physical, chart, labs and discussed the procedure including the risks, benefits and alternatives for the proposed anesthesia with the patient or authorized representative who has indicated his/her understanding and acceptance.   ? ? ? ?Dental advisory given ? ?Plan Discussed with:  ? ?Anesthesia Plan Comments:   ? ? ? ? ? ? ?Anesthesia Quick Evaluation ? ?

## 2021-08-03 NOTE — Anesthesia Postprocedure Evaluation (Signed)
Anesthesia Post Note ? ?Patient: Meghan Alexander ? ?Procedure(s) Performed: VIDEO BRONCHOSCOPY WITH FLUORO ?BRONCHIAL WASHINGS ?BRONCHIAL BRUSHINGS ? ?  ? ?Patient location during evaluation: PACU ?Anesthesia Type: MAC ?Level of consciousness: awake and alert ?Pain management: pain level controlled ?Vital Signs Assessment: post-procedure vital signs reviewed and stable ?Respiratory status: spontaneous breathing, nonlabored ventilation and respiratory function stable ?Cardiovascular status: blood pressure returned to baseline and stable ?Postop Assessment: no apparent nausea or vomiting ?Anesthetic complications: no ? ? ?No notable events documented. ? ?Last Vitals:  ?Vitals:  ? 08/03/21 1446 08/03/21 1455  ?BP: 120/78 132/75  ?Pulse: 98 98  ?Resp: (!) 26 (!) 24  ?Temp:    ?SpO2: 92% 93%  ?  ?Last Pain:  ?Vitals:  ? 08/03/21 1455  ?TempSrc:   ?PainSc: 0-No pain  ? ? ?  ?  ?  ?  ?  ?  ? ?Lynda Rainwater ? ? ? ? ?

## 2021-08-03 NOTE — Transfer of Care (Signed)
Immediate Anesthesia Transfer of Care Note ? ?Patient: Meghan Alexander ? ?Procedure(s) Performed: VIDEO BRONCHOSCOPY WITH FLUORO ? ?Patient Location: Endoscopy Unit ? ?Anesthesia Type:MAC ? ?Level of Consciousness: awake, alert  and oriented ? ?Airway & Oxygen Therapy: Patient Spontanous Breathing and Patient connected to nasal cannula oxygen ? ?Post-op Assessment: Report given to RN and Post -op Vital signs reviewed and stable ? ?Post vital signs: Reviewed and stable ? ?Last Vitals:  ?Vitals Value Taken Time  ?BP 119/72   ?Temp    ?Pulse 92   ?Resp 14   ?SpO2 96%   ? ? ?Last Pain:  ?Vitals:  ? 08/03/21 1251  ?TempSrc: Temporal  ?PainSc: 0-No pain  ?   ? ?  ? ?Complications: No notable events documented. ?

## 2021-08-03 NOTE — Progress Notes (Signed)
?   08/03/21 1727  ?Assess: MEWS Score  ?Temp 98 ?F (36.7 ?C)  ?BP (!) 149/80  ?Pulse Rate (!) 117  ?ECG Heart Rate (!) 116  ?Resp 18  ?Level of Consciousness Alert  ?SpO2 94 %  ?O2 Device Room Air  ?Assess: MEWS Score  ?MEWS Temp 0  ?MEWS Systolic 0  ?MEWS Pulse 2  ?MEWS RR 0  ?MEWS LOC 0  ?MEWS Score 2  ?MEWS Score Color Yellow  ?Assess: if the MEWS score is Yellow or Red  ?Were vital signs taken at a resting state? Yes  ?Focused Assessment No change from prior assessment  ?Early Detection of Sepsis Score *See Row Information* Low  ?MEWS guidelines implemented *See Row Information* Yes  ?Treat  ?Pain Scale 0-10  ?Pain Score 0  ?Take Vital Signs  ?Increase Vital Sign Frequency  Yellow: Q 2hr X 2 then Q 4hr X 2, if remains yellow, continue Q 4hrs  ?Escalate  ?MEWS: Escalate Yellow: discuss with charge nurse/RN and consider discussing with provider and RRT  ?Notify: Charge Nurse/RN  ?Name of Charge Nurse/RN Notified John P  ?Date Charge Nurse/RN Notified 08/03/21  ?Time Charge Nurse/RN Notified 1727  ?Notify: Provider  ?Provider Name/Title Dia Crawford  ?Date Provider Notified 08/03/21  ?Time Provider Notified [phone]  ?Notification Type Page  ?Document  ?Progress note created (see row info) Yes  ? ? ?

## 2021-08-03 NOTE — Progress Notes (Signed)
?PROGRESS NOTE ? ? ? ?Meghan Alexander  DZH:[id number] DOB: Jun 12, 1955 DOA: 08/02/2021 ?PCP: [name], MD  ? ? ? ?Brief Narrative:  ?66 y.o. 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 ?Presents with worsening dyspnea on exertion and hypoxia.  ?  ?Pt had symptoms of pneumonia in 04/2021 and treated with Keflex by PCP but continued to have persistent symptom of shortness of breath with exertion and nonproductive cough.  She later had a virtual visit for continued symptoms and was treated for allergies with no relief. She then had CT chest/abd/pelvis imaging obtained by oncology for lymphoma surveillance on 07/14/21 had incidental finding bilateral extensive multifocal ground glass opacity with interstitial thickening. 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 Referral was also made to pulmonary with appointment later this week.  ?  ?She had repeat CT chest without contrast on 4/4 by GI to assess for a planned heller myotomy surgery for her achalasia and showed worsening peribronchovascular groundglass with associated bronchiectasis, minimal consolidation and scattered volume loss, finding which may be due to an atypical or viral pneumonia.  This was treated again with another course of antibiotics by her PCP.  However she continued to have worsening symptoms and this week also noted intermittent anterior chest pain and upper back pain. Night sweats daily requiring her to change clothes 3x in the night. Has noticed blood when blowing her nose.  Has had drops in her oxygen saturation from 82 to 94% and decided to present to the ED. ?She denies history of tobacco use.  No recent travels or sick contacts. ?  ?In the ED, she was afebrile normotensive and stable on room air.  No leukocytosis with  mild anemia with hemoglobin 11.3.  Sodium 137, K of 3.1, glucose of 237, normal creatinine of 0.9.  Sed rate was elevated at 75, D-dimer mildly elevated 0.66, negative influenza PCR and COVID PCR. ?Pulmonology was consulted by ED physician who has plans to take her for bronchoscopy tomorrow. ? ? ?Subjective: ?A/O x4, positive conversational dyspnea.  Positive coughing paroxysms when she attempts to speak ? ? ?Assessment & Plan: ?Covid vaccination; vaccinated 4/4 ?  ?Principal Problem: ?  Acute respiratory failure with hypoxia ([place]) ?Active Problems: ?  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  Dyspnea ?  HTN (hypertension) ?  Type 2 diabetes mellitus ([place]) ?  Hypokalemia ?  Achalasia ?  Dyspnea on exertion ?  Uncontrolled type 2 diabetes mellitus with hyperglycemia ([place]) ? ?Acute respiratory failure with hypoxia (HCC)/Positive Conversational Dyspnea ?Presented with hypoxia down to 84% with ambulation but stable at rest.  Had symptoms of persistent exertional dyspnea with nonproductive cough since January and has been treated on 3 different courses of antibiotics.  Had CT chest imaging on 07/14/2021 showing extensive bilateral multifocal groundglass opacity with interstitial thickening.  CT chest without contrast on 4/4 showed worsening peribronchovascular groundglass with associated bronchiectasis.  Originally had outpatient pulmonology follow-up this week. ?-Pulmonology consulted and has obtained Quantiferon gold, glomerular basement member antibiotics, aspergillus Ag, Fungitell, strep pneumoniae urine antigen, Legionella pneumo and histoplasm antigen ?-No antibiotics or steroids per pulmonology ?- Flu/COVID PCR negative.  Respiratory viral panel negative. ?-4/11 s/p Bronchoscopy with BAL positive conversational dyspnea ?-4/11 DuoNeb QID ?-4/11 s/p continuous pulse ox ?- 4/11 titrate O2 to maintain SPO2>  92% ?-4/12 ambulatory SPO2 pending ? ?New onset Sinus Tachycardia/Essential  HTN ?-Losartan 50 mg daily ?- Toprol 50 mg daily ?  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 Irene Limbo with oncology ?-Completed Bendamustine Rituxan chemotherapy followed by 18 months of Rituxan maintenance with last dose 02/24/2021. No clinical or radiographic evidence of persistent follicular lymphoma as of recent scans 07/15/2020 ?-4/11 consult Dr. Irene Limbo Oncology [place] ?  ?Achalasia ?-S/p Heller myotomy with fundoplication in 9485 at [facility]. Had plans for repeat of the procedure but this has been postponed due to recent pulmonary symptoms ?-Continue with thickened diet ?  ?Hypokalemia ?-Potassium goal> 4 ?-4/11 potassium IV 60 mEq + Potassium p.o. 40 mEq ? ?Hypomagnesmia ?- Magnesium goal> 2 ?- 4/11 magnesium IV 2 g ? ?DM type II uncontrolled with hyperglycemia ?Uncontrolled with hyperglycemia ?-Placed on sensitive SSI ?  ?  ? ?Mobility Assessment (last 72 hours)   ? ? Mobility Assessment   ? ? Industry Name 08/03/21 1727  ?  ?  ?  ?  ? Does patient have an order for bedrest or is patient medically unstable No - Continue assessment      ? What is the highest level of mobility based on the progressive mobility assessment? Level 6 (Walks independently in room and hall) - Balance while walking in room without assist - Complete      ? ?  ?  ? ?  ? ? ?DVT prophylaxis:  ?Code Status:  ?Family Communication:  ?Status is: Inpatient ? ? ? ?Dispo: The patient is from:  ?             Anticipated d/c is to:  ?             Anticipated d/c date is:  ?             Patient currently is not medically stable to d/c. ? ? ? ? ? ?Consultants:  ?PCCM ? ? ?Procedures/Significant Events:  ?4/11 Bronchoscopy with BAL ? ? ? ?I have personally reviewed and interpreted all radiology studies and my findings are as above. ? ?VENTILATOR SETTINGS: ? ? ? ?Cultures ?4/10 Respiratory Virus Panel Negative ? ? ? ?Antimicrobials: ? ? ? ?Devices ?  ? ?LINES / TUBES:  ? ? ? ? ?Continuous Infusions: ? potassium  chloride 10 mEq (08/03/21 2010)  ? ? ? ?Objective: ?Vitals:  ? 08/03/21 2022 08/03/21 2038 08/03/21 2044 08/03/21 2120  ?BP: (!) 154/72   (!) 118/51  ?Pulse: (!) 117 (!) 119  (!) 116  ?Resp:    20  ?Temp:    98.2 ?F (36.8 ?C)  ?TempSrc:    Oral  ?SpO2: 91% 94% 95% 96%  ? ? ?Intake/Output Summary (Last 24 hours) at 08/03/2021 2235 ?Last data filed at 08/03/2021 1800 ?Gross per 24 hour  ?Intake 1300 ml  ?Output --  ?Net 1300 ml  ? ?There were no vitals filed for this visit. ? ?Examination: ? ?General: Positive acute respiratory distress with exertion. ?Eyes: negative scleral hemorrhage, negative anisocoria, negative icterus ?ENT: Negative Runny nose, negative gingival bleeding, ?Neck:  Negative scars, masses, torticollis, lymphadenopathy, JVD ?Lungs: Clear to auscultation bilaterally, positive crackles, positive conversational dyspnea, ?Cardiovascular: Regular rate and rhythm without murmur gallop or rub normal S1 and S2 ?Abdomen: negative abdominal pain, nondistended, positive soft, bowel sounds, no rebound, no ascites, no appreciable mass ?Extremities: No significant cyanosis, clubbing, or edema bilateral lower extremities ?Skin: Negative rashes, lesions, ulcers ?Psychiatric:  Negative depression, negative anxiety, negative fatigue, negative mania  ?Central nervous system:  Cranial nerves II through XII intact, tongue/uvula midline, all extremities muscle strength 5/5, sensation intact throughout, negative dysarthria, negative expressive aphasia, negative receptive aphasia. ? ?.  ? ? ? ?Data Reviewed: Care during the described time interval was provided by me .  I have reviewed this patient's available data, including medical history, events of note, physical examination, and all test results as part of my evaluation. ? ?CBC: ?Recent Labs  ?Lab 08/02/21 ?1348 08/03/21 ?0800  ?WBC 8.4 7.8  ?NEUTROABS 7.2 6.3  ?HGB 11.3* 11.0*  ?HCT 36.3 34.9*  ?MCV 83.6 83.7  ?PLT 340 329  ? ?Basic Metabolic Panel: ?Recent Labs  ?Lab  08/02/21 ?1348 08/03/21 ?0629 08/03/21 ?0800  ?NA 137 139  --   ?K 3.1* 2.9*  --   ?CL 98 100  --   ?CO2 28 30  --   ?GLUCOSE 237* 163*  --   ?BUN 11 8  --   ?CREATININE 0.90 0.73  --   ?CALCIUM 9.1 8.4*  --

## 2021-08-03 NOTE — Interval H&P Note (Signed)
History and Physical Interval Note: ? ?08/03/2021 ?11:00 AM ? ?Meghan Alexander  has presented today for surgery, with the diagnosis of abnormal chest CT.  The various methods of treatment have been discussed with the patient and family. After consideration of risks, benefits and other options for treatment, the patient has consented to  Procedure(s): ?VIDEO BRONCHOSCOPY WITHOUT FLUORO (N/A) as a surgical intervention.  The patient's history has been reviewed, patient examined, no change in status, stable for surgery.  I have reviewed the patient's chart and labs.  Questions were answered to the patient's satisfaction.   ? ? ?Rose Fillers [name] ? ? ?

## 2021-08-04 ENCOUNTER — Encounter (HOSPITAL_COMMUNITY): Payer: Self-pay | Admitting: Emergency Medicine

## 2021-08-04 DIAGNOSIS — R0609 Other forms of dyspnea: Secondary | ICD-10-CM | POA: Diagnosis not present

## 2021-08-04 DIAGNOSIS — J9601 Acute respiratory failure with hypoxia: Secondary | ICD-10-CM | POA: Diagnosis not present

## 2021-08-04 DIAGNOSIS — K220 Achalasia of cardia: Secondary | ICD-10-CM | POA: Diagnosis not present

## 2021-08-04 DIAGNOSIS — C8218 Follicular lymphoma grade II, lymph nodes of multiple sites: Secondary | ICD-10-CM | POA: Diagnosis not present

## 2021-08-04 LAB — COMPREHENSIVE METABOLIC PANEL
ALT: 17 U/L (ref 0–44)
AST: 20 U/L (ref 15–41)
Albumin: 2.5 g/dL — ABNORMAL LOW (ref 3.5–5.0)
Alkaline Phosphatase: 66 U/L (ref 38–126)
Anion gap: 7 (ref 5–15)
BUN: <5 mg/dL — ABNORMAL LOW (ref 8–23)
CO2: 28 mmol/L (ref 22–32)
Calcium: 8.1 mg/dL — ABNORMAL LOW (ref 8.9–10.3)
Chloride: 101 mmol/L (ref 98–111)
Creatinine, Ser: 0.81 mg/dL (ref 0.44–1.00)
GFR, Estimated: >60 mL/min (ref >60)
Glucose, Bld: 141 mg/dL — ABNORMAL HIGH (ref 70–99)
Potassium: 3.9 mmol/L (ref 3.5–5.1)
Sodium: 136 mmol/L (ref 135–145)
Total Bilirubin: 0.7 mg/dL (ref 0.3–1.2)
Total Protein: 5.2 g/dL — ABNORMAL LOW (ref 6.5–8.1)

## 2021-08-04 LAB — CBC WITH DIFFERENTIAL/PLATELET
Abs Immature Granulocytes: 0.07 10*3/uL (ref 0.00–0.07)
Basophils Absolute: 0 10*3/uL (ref 0.0–0.1)
Basophils Relative: 0 %
Eosinophils Absolute: 0 10*3/uL (ref 0.0–0.5)
Eosinophils Relative: 0 %
HCT: 28.9 % — ABNORMAL LOW (ref 36.0–46.0)
Hemoglobin: 9.3 g/dL — ABNORMAL LOW (ref 12.0–15.0)
Immature Granulocytes: 1 %
Lymphocytes Relative: 5 %
Lymphs Abs: 0.4 10*3/uL — ABNORMAL LOW (ref 0.7–4.0)
MCH: 26.6 pg (ref 26.0–34.0)
MCHC: 32.2 g/dL (ref 30.0–36.0)
MCV: 82.8 fL (ref 80.0–100.0)
Monocytes Absolute: 0.8 10*3/uL (ref 0.1–1.0)
Monocytes Relative: 10 %
Neutro Abs: 7.4 10*3/uL (ref 1.7–7.7)
Neutrophils Relative %: 84 %
Platelets: 229 10*3/uL (ref 150–400)
RBC: 3.49 MIL/uL — ABNORMAL LOW (ref 3.87–5.11)
RDW: 14.9 % (ref 11.5–15.5)
WBC: 8.8 10*3/uL (ref 4.0–10.5)
nRBC: 0 % (ref 0.0–0.2)

## 2021-08-04 LAB — GLUCOSE, CAPILLARY
Glucose-Capillary: 188 mg/dL — ABNORMAL HIGH (ref 70–99)
Glucose-Capillary: 173 mg/dL — ABNORMAL HIGH (ref 70–99)
Glucose-Capillary: 325 mg/dL — ABNORMAL HIGH (ref 70–99)
Glucose-Capillary: 197 mg/dL — ABNORMAL HIGH (ref 70–99)
Glucose-Capillary: 314 mg/dL — ABNORMAL HIGH (ref 70–99)

## 2021-08-04 LAB — ACID FAST SMEAR (AFB, MYCOBACTERIA): Acid Fast Smear: NEGATIVE

## 2021-08-04 LAB — PHOSPHORUS: Phosphorus: 2.3 mg/dL — ABNORMAL LOW (ref 2.5–4.6)

## 2021-08-04 LAB — CYTOLOGY - NON PAP

## 2021-08-04 LAB — GLOMERULAR BASEMENT MEMBRANE ANTIBODIES: GBM Ab: <0.2 units (ref 0.0–0.9)

## 2021-08-04 LAB — MAGNESIUM: Magnesium: 2 mg/dL (ref 1.7–2.4)

## 2021-08-04 MED ORDER — INSULIN DETEMIR 100 UNIT/ML ~~LOC~~ SOLN
5.0000 [IU] | Freq: Every day | SUBCUTANEOUS | Status: DC
  Administered 2021-08-04 – 2021-08-07 (×4): 5 [IU] via SUBCUTANEOUS
  Filled 2021-08-04 (×5): qty 0.05

## 2021-08-04 MED ORDER — INSULIN ASPART 100 UNIT/ML IJ SOLN
0.0000 [IU] | Freq: Every day | INTRAMUSCULAR | Status: DC
  Administered 2021-08-04: 4 [IU] via SUBCUTANEOUS
  Administered 2021-08-06: 3 [IU] via SUBCUTANEOUS
  Administered 2021-08-07: 5 [IU] via SUBCUTANEOUS

## 2021-08-04 MED ORDER — INSULIN ASPART 100 UNIT/ML IJ SOLN
0.0000 [IU] | Freq: Three times a day (TID) | INTRAMUSCULAR | Status: DC
  Administered 2021-08-05: 2 [IU] via SUBCUTANEOUS
  Administered 2021-08-05: 5 [IU] via SUBCUTANEOUS
  Administered 2021-08-05: 3 [IU] via SUBCUTANEOUS
  Administered 2021-08-06 (×3): 2 [IU] via SUBCUTANEOUS
  Administered 2021-08-07 (×2): 1 [IU] via SUBCUTANEOUS
  Administered 2021-08-07: 2 [IU] via SUBCUTANEOUS
  Administered 2021-08-08: 3 [IU] via SUBCUTANEOUS
  Administered 2021-08-08: 9 [IU] via SUBCUTANEOUS
  Administered 2021-08-08: 3 [IU] via SUBCUTANEOUS
  Administered 2021-08-09: 5 [IU] via SUBCUTANEOUS

## 2021-08-04 MED ORDER — ACETAMINOPHEN 325 MG PO TABS
650.0000 mg | ORAL_TABLET | Freq: Four times a day (QID) | ORAL | Status: DC | PRN
  Administered 2021-08-04 – 2021-08-09 (×9): 650 mg via ORAL
  Filled 2021-08-04 (×9): qty 2

## 2021-08-04 MED ORDER — IPRATROPIUM-ALBUTEROL 0.5-2.5 (3) MG/3ML IN SOLN
3.0000 mL | RESPIRATORY_TRACT | Status: DC | PRN
  Administered 2021-08-04: 3 mL via RESPIRATORY_TRACT
  Filled 2021-08-04: qty 3

## 2021-08-04 NOTE — Progress Notes (Signed)
eLink Physician-Brief Progress Note ?Patient Name: Meghan Alexander ?DOB: 1955-11-27 ?MRN: [id number] ? ? ?Date of Service ? 08/04/2021  ?HPI/Events of Note ? Hyperglycemia - Blood glucose = 314.  ?eICU Interventions ? Plan: ?Change to AC/HS sensitive Novolog SSI.  ? ? ? ?Intervention Category ?Major Interventions: Hyperglycemia - active titration of insulin therapy ? ?Salimatou Simone Cornelia Copa ?08/04/2021, 9:31 PM ?

## 2021-08-04 NOTE — Progress Notes (Signed)
SATURATION QUALIFICATIONS: (This note is used to comply with regulatory documentation for home oxygen) ? ?Patient Saturations on Room Air at Rest = 97% ? ?Patient Saturations on Room Air while Ambulating = 95% ? ?Patient Saturations on NA Liters of oxygen while Ambulating = NA% - Pt did not require O2 while ambulating.  ? ?Please briefly explain why patient needs home oxygen: ?

## 2021-08-04 NOTE — Progress Notes (Signed)
?  Transition of Care (TOC) Screening Note ? ? ?Patient Details  ?Name: Meghan Alexander ?Date of Birth: [birth year] ? ? ?Transition of Care (TOC) CM/SW Contact:    ?Cyndi Bender, RN ?Phone Number: ?08/04/2021, 8:34 AM ? ? ? ?Transition of Care Department [facility]) has reviewed patient and no TOC needs have been identified at this time. We will continue to monitor patient advancement through interdisciplinary progression rounds. If new patient transition needs arise, please place a TOC consult. ? ? ?

## 2021-08-04 NOTE — Progress Notes (Signed)
?PROGRESS NOTE ? ? ? ?Meghan Alexander  MBW:[id number] DOB: Mar 23, 1956 DOA: 08/02/2021 ?PCP: [name], MD  ? ? ? ?Brief Narrative:  ?66 y.o. 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 ?Presents with worsening dyspnea on exertion and hypoxia.  ?  ?Pt had symptoms of pneumonia in 04/2021 and treated with Keflex by PCP but continued to have persistent symptom of shortness of breath with exertion and nonproductive cough.  She later had a virtual visit for continued symptoms and was treated for allergies with no relief. She then had CT chest/abd/pelvis imaging obtained by oncology for lymphoma surveillance on 07/14/21 had incidental finding bilateral extensive multifocal ground glass opacity with interstitial thickening. 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 Referral was also made to pulmonary with appointment later this week.  ?  ?She had repeat CT chest without contrast on 4/4 by GI to assess for a planned heller myotomy surgery for her achalasia and showed worsening peribronchovascular groundglass with associated bronchiectasis, minimal consolidation and scattered volume loss, finding which may be due to an atypical or viral pneumonia.  This was treated again with another course of antibiotics by her PCP.  However she continued to have worsening symptoms and this week also noted intermittent anterior chest pain and upper back pain. Night sweats daily requiring her to change clothes 3x in the night. Has noticed blood when blowing her nose.  Has had drops in her oxygen saturation from 82 to 94% and decided to present to the ED. ?She denies history of tobacco use.  No recent travels or sick contacts. ?  ?In the ED, she was afebrile normotensive and stable on room air.  No leukocytosis with  mild anemia with hemoglobin 11.3.  Sodium 137, K of 3.1, glucose of 237, normal creatinine of 0.9.  Sed rate was elevated at 75, D-dimer mildly elevated 0.66, negative influenza PCR and COVID PCR. ?Pulmonology was consulted by ED physician who has plans to take her for bronchoscopy tomorrow. ? ? ?Subjective: ?4/12 afebrile overnight, sinus tachycardia resolved.  Positive Dyspnea on exertion ? ? ?Assessment & Plan: ?Covid vaccination; vaccinated 4/4 ?  ?Principal Problem: ?  Acute respiratory failure with hypoxia ([place]) ?Active Problems: ?  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  Dyspnea ?  HTN (hypertension) ?  Type 2 diabetes mellitus ([place]) ?  Hypokalemia ?  Achalasia ?  Dyspnea on exertion ?  Uncontrolled type 2 diabetes mellitus with hyperglycemia ([place]) ? ?Acute respiratory failure with hypoxia (HCC)/Positive Conversational Dyspnea ?Presented with hypoxia down to 84% with ambulation but stable at rest.  Had symptoms of persistent exertional dyspnea with nonproductive cough since January and has been treated on 3 different courses of antibiotics.  Had CT chest imaging on 07/14/2021 showing extensive bilateral multifocal groundglass opacity with interstitial thickening.  CT chest without contrast on 4/4 showed worsening peribronchovascular groundglass with associated bronchiectasis.  Originally had outpatient pulmonology follow-up this week. ?-Pulmonology consulted and has obtained Quantiferon gold, glomerular basement member antibiotics, aspergillus Ag, Fungitell, strep pneumoniae urine antigen, Legionella pneumo and histoplasm antigen ?-No antibiotics or steroids per pulmonology ?- Flu/COVID PCR negative.  Respiratory viral panel negative. ?-4/11 s/p Bronchoscopy with BAL positive conversational dyspnea ?-4/11 DuoNeb QID ?-4/11 s/p continuous pulse ox ?- 4/11 titrate O2 to maintain SPO2> 92% ?SATURATION QUALIFICATIONS: (  This note is used to comply with regulatory documentation for  home oxygen) ?Patient Saturations on Room Air at Rest = 97% ?Patient Saturations on Room Air while Ambulating = 95% ?Patient Saturations on NA Liters of oxygen while Ambulating = NA% - Pt did not require O2 while ambulating.  ?Please briefly explain why patient needs home oxygen: ?-4/12 patient does not meet requirements for home O2 ? ?New onset Sinus Tachycardia/Essential HTN ?-Losartan 50 mg daily ?- Toprol 50 mg daily ?-4/12 controlled ?  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 Irene Limbo with oncology ?-Completed Bendamustine Rituxan chemotherapy followed by 18 months of Rituxan maintenance with last dose 02/24/2021. No clinical or radiographic evidence of persistent follicular lymphoma as of recent scans 07/15/2020 ?-4/12 discussed case with Dr. Irene Limbo Oncology colleague and he felt that immunotherapy was too distant in the past to be causing current respiratory symptoms.  ?  ?Achalasia ?-S/p Heller myotomy with fundoplication in 1914 at [facility]. Had plans for repeat of the procedure but this has been postponed due to recent pulmonary symptoms ?-Continue with thickened diet ?  ?Hypokalemia ?-Potassium goal> 4 ?-4/11 potassium IV 60 mEq + Potassium p.o. 40 mEq ? ?Hypomagnesmia ?- Magnesium goal> 2 ?- 4/11 magnesium IV 2 g ? ?DM type II uncontrolled with hyperglycemia ?-4/12 Levemir 5 units nightly ?-4/12 increase placed on sensitive SSI ?  ?  ? ?Mobility Assessment (last 72 hours)   ? ? Mobility Assessment   ? ? [place] Name 08/03/21 1727  ?  ?  ?  ?  ? Does patient have an order for bedrest or is patient medically unstable No - Continue assessment      ? What is the highest level of mobility based on the progressive mobility assessment? Level 6 (Walks independently in room and hall) - Balance while walking in room without assist - Complete      ? ?  ?  ? ?  ? ? ?DVT prophylaxis: Lovenox ?Code Status: Full ?Family Communication:  ?Status is: Inpatient ? ? ? ?Dispo: The  patient is from: Home ?             Anticipated d/c is to: Home ?             Anticipated d/c date is: 1 day ?             Patient currently is not medically stable to d/c. ? ? ? ? ? ?Consultants:  ?PCCM ? ? ?Procedures/Significant Events:  ?4/11 Bronchoscopy with BAL ? ? ? ?I have personally reviewed and interpreted all radiology studies and my findings are as above. ? ?VENTILATOR SETTINGS: ?Nasal cannula 4/12 ?Flow 2 L/min ?SPO2 92% ? ? ?Cultures ?4/10 Respiratory Virus Panel Negative ?4/10 SARS coronavirus negative ? ? ?Antimicrobials: ?Anti-infectives (From admission, onward)  ? ? None  ? ?  ?  ? ? ?Devices ?  ? ?LINES / TUBES:  ? ? ? ? ?Continuous Infusions: ? ? ? ? ?Objective: ?Vitals:  ? 08/04/21 0345 08/04/21 0400 08/04/21 0500 08/04/21 0744  ?BP: 108/71   114/74  ?Pulse: 85 89 84 94  ?Resp: 18   16  ?Temp: 98.3 ?F (36.8 ?C)   97.6 ?F (36.4 ?C)  ?TempSrc: Oral   Oral  ?SpO2: 92% 92% 92% 95%  ? ? ?Intake/Output Summary (Last 24 hours) at 08/04/2021 0932 ?Last data filed at 08/03/2021 1800 ?Gross per 24 hour  ?Intake 1200 ml  ?Output --  ?Net  1200 ml  ? ? ?There were no vitals filed for this visit. ? ?Examination: ? ?General: Positive acute respiratory distress with exertion. ?Eyes: negative scleral hemorrhage, negative anisocoria, negative icterus ?ENT: Negative Runny nose, negative gingival bleeding, ?Neck:  Negative scars, masses, torticollis, lymphadenopathy, JVD ?Lungs: tachypnea with speech, poor air movement bilaterally, no crackles, positive conversational dyspnea, ?Cardiovascular: Regular rate and rhythm without murmur gallop or rub normal S1 and S2 ?Abdomen: negative abdominal pain, nondistended, positive soft, bowel sounds, no rebound, no ascites, no appreciable mass ?Extremities: No significant cyanosis, clubbing, or edema bilateral lower extremities ?Skin: Negative rashes, lesions, ulcers ?Psychiatric:  Negative depression, negative anxiety, negative fatigue, negative mania  ?Central nervous system:   Cranial nerves II through XII intact, tongue/uvula midline, all extremities muscle strength 5/5, sensation intact throughout, negative dysarthria, negative expressive aphasia, negative receptive aphasia. ? ?

## 2021-08-04 NOTE — Consult Note (Signed)
? ?NAME:  Matie Dimaano, MRN:  [id number], DOB:  1955-09-28, LOS: 1 ?ADMISSION DATE:  08/02/2021, CONSULTATION DATE:  08/02/21 ?REFERRING MD:  Melina Copa- EM, CHIEF COMPLAINT:  SOB ? ?History of Present Illness:  ? ?66 yo F PMH follicular lymphoma sp chemo, achalasia, who presented to [facility] 4/10 with progressive SOB, cough.  ?Patient has actually been referred to pulm for evaluation of SOB and abnormal CT scans and is scheduled for initial appointment 08/11/21-- In 07/14/2021 pt CT chest with GGOs, and repeat CT chest 07/27/21 with progression of GGOs.  ? ?Regarding ED presentation -- cough has been longstanding, SOB more recent over past month and progressive the last week. Associated chills, night sweats, back pain, fatigue over last week. Endorses wt loss (34lb over 3 mo) but attributes to achalasia. No fever, sick contacts, travel. She has had 3 recent courses of abx for possible PNA as etiology of GGO and SOB, without improvement in sx ? ?Follows with Dr. Irene Limbo in [place] -- completed bendamustin, rituximab and subsequent 29morituximab maintenance. No further maintenance planned right now ? ?PCCM is consulted in this setting  ? ?Pertinent  Medical History  ?Follicular lymphoma ?Achalasia  ?NASH ? ?Significant Hospital Events: ?Including procedures, antibiotic start and stop dates in addition to other pertinent events   ?4/10 ED for progressive SOB + new night sweats, and known GGOs on CT  ?Bronchoscopy with BAL and transbronchial brushings on 4/11 >>  ? ?Interim History / Subjective:  ?Tolerated bronchoscopy 4/11 ?On room air ?Still having shortness of breath ? ?Objective   ?Blood pressure 99/65, pulse 95, temperature 98 ?F (36.7 ?C), temperature source Oral, resp. rate 16, SpO2 95 %. ?   ?   ? ?Intake/Output Summary (Last 24 hours) at 08/04/2021 1322 ?Last data filed at 08/03/2021 1800 ?Gross per 24 hour  ?Intake 900 ml  ?Output --  ?Net 900 ml  ? ?There were no vitals filed for this visit. ? ?Examination: ?General:  Comfortable, laying in bed, no distress ?HENT: Clear, no stridor ?Lungs: Clear bilaterally ?Cardiovascular: Regular, no murmur ?Abdomen: Nondistended ?Extremities: No edema ?Neuro: Nonfocal ?GU: defer ? ?Resolved Hospital Problem list   ? ? ?Assessment & Plan:  ? ?Abnormal CT chest ?-GGOs have progressed from 06/2021 CT to 07/2021 CT.  Timing wise, unlikely to be lung tox from rituximab (last given 02/2021, had tolerated well during tx)  ?-?slow progressing infection -- still consider pt to be immunocompromised and at risk for less common infections. In infection present, this likely explains her associated presenting sx. Back pain is related to cough.  ?P ?-Await bronchoscopy cytologies, culture data.  Cell count reassuring ?-QuantiFERON gold pending, fungitell pending, RVP negative, Aspergillus antigen pending ?-Continue to hold off on antibiotics, steroids ?-Bronchodilators as needed ? ?Achalasia  ?-essentially a liquid diet  ? ?Follicular lymphoma s/p chemo and immunotherapy ?-completed chemo and immunotherapy  ? ?Back pain  ?-r/t cough ?P ?-lido patch  ? ?Best Practice (right click and "Reselect all SmartList Selections" daily)  ? ?Per primary  ? ?Labs   ?CBC: ?Recent Labs  ?Lab 08/02/21 ?1348 08/03/21 ?0800 08/04/21 ?0205  ?WBC 8.4 7.8 8.8  ?NEUTROABS 7.2 6.3 7.4  ?HGB 11.3* 11.0* 9.3*  ?HCT 36.3 34.9* 28.9*  ?MCV 83.6 83.7 82.8  ?PLT 340 329 229  ? ? ? ?Basic Metabolic Panel: ?Recent Labs  ?Lab 08/02/21 ?1348 08/03/21 ?0629 08/03/21 ?0800 08/04/21 ?0205  ?NA 137 139  --  136  ?K 3.1* 2.9*  --  3.9  ?CL  98 100  --  101  ?CO2 28 30  --  28  ?GLUCOSE 237* 163*  --  141*  ?BUN 11 8  --  <5*  ?CREATININE 0.90 0.73  --  0.81  ?CALCIUM 9.1 8.4*  --  8.1*  ?MG  --   --  1.8 2.0  ?PHOS  --   --  3.0 2.3*  ? ? ?GFR: ?CrCl cannot be calculated (Unknown ideal weight.). ?Recent Labs  ?Lab 08/02/21 ?1348 08/03/21 ?0800 08/04/21 ?0205  ?WBC 8.4 7.8 8.8  ? ? ? ?Liver Function Tests: ?Recent Labs  ?Lab 08/02/21 ?1348  08/04/21 ?0205  ?AST 28 20  ?ALT 23 17  ?ALKPHOS 76 66  ?BILITOT 0.6 0.7  ?PROT 6.5 5.2*  ?ALBUMIN 3.0* 2.5*  ? ? ?No results for input(s): LIPASE, AMYLASE in the last 168 hours. ?No results for input(s): AMMONIA in the last 168 hours. ? ?ABG ?No results found for: PHART, PCO2ART, PO2ART, HCO3, TCO2, ACIDBASEDEF, O2SAT  ? ?Coagulation Profile: ?Recent Labs  ?Lab 08/02/21 ?1538  ?INR 1.1  ? ? ? ?Cardiac Enzymes: ?No results for input(s): CKTOTAL, CKMB, CKMBINDEX, TROPONINI in the last 168 hours. ? ?HbA1C: ?No results found for: HGBA1C ? ?CBG: ?Recent Labs  ?Lab 08/03/21 ?1132 08/03/21 ?1730 08/03/21 ?2230 08/04/21 ?0743 08/04/21 ?1143  ?GLUCAP 156* 188* 153* 173* 325*  ? ? ? ?Critical care time: n/a ?  ? ?Baltazar Apo, MD, PhD ?08/04/2021, 1:28 PM ?[facility] ?[phone] or if no answer before 7:00PM call [phone] ?For any issues after 7:00PM please call eLink [phone] ? ? ?

## 2021-08-05 DIAGNOSIS — R0609 Other forms of dyspnea: Secondary | ICD-10-CM | POA: Diagnosis not present

## 2021-08-05 DIAGNOSIS — K220 Achalasia of cardia: Secondary | ICD-10-CM | POA: Diagnosis not present

## 2021-08-05 DIAGNOSIS — C8218 Follicular lymphoma grade II, lymph nodes of multiple sites: Secondary | ICD-10-CM | POA: Diagnosis not present

## 2021-08-05 DIAGNOSIS — J9601 Acute respiratory failure with hypoxia: Secondary | ICD-10-CM | POA: Diagnosis not present

## 2021-08-05 DIAGNOSIS — R918 Other nonspecific abnormal finding of lung field: Secondary | ICD-10-CM

## 2021-08-05 LAB — COMPREHENSIVE METABOLIC PANEL
ALT: 20 U/L (ref 0–44)
AST: 19 U/L (ref 15–41)
Albumin: 2.8 g/dL — ABNORMAL LOW (ref 3.5–5.0)
Alkaline Phosphatase: 80 U/L (ref 38–126)
Anion gap: 7 (ref 5–15)
BUN: 5 mg/dL — ABNORMAL LOW (ref 8–23)
CO2: 29 mmol/L (ref 22–32)
Calcium: 8.6 mg/dL — ABNORMAL LOW (ref 8.9–10.3)
Chloride: 101 mmol/L (ref 98–111)
Creatinine, Ser: 0.9 mg/dL (ref 0.44–1.00)
GFR, Estimated: >60 mL/min (ref >60)
Glucose, Bld: 188 mg/dL — ABNORMAL HIGH (ref 70–99)
Potassium: 4.2 mmol/L (ref 3.5–5.1)
Sodium: 137 mmol/L (ref 135–145)
Total Bilirubin: 0.3 mg/dL (ref 0.3–1.2)
Total Protein: 6.1 g/dL — ABNORMAL LOW (ref 6.5–8.1)

## 2021-08-05 LAB — FUNGITELL, SERUM: Fungitell Result: <31 pg/mL (ref <80)

## 2021-08-05 LAB — MAGNESIUM: Magnesium: 2 mg/dL (ref 1.7–2.4)

## 2021-08-05 LAB — CBC WITH DIFFERENTIAL/PLATELET
Abs Immature Granulocytes: 0.06 10*3/uL (ref 0.00–0.07)
Basophils Absolute: 0 10*3/uL (ref 0.0–0.1)
Basophils Relative: 0 %
Eosinophils Absolute: 0.1 10*3/uL (ref 0.0–0.5)
Eosinophils Relative: 1 %
HCT: 33.1 % — ABNORMAL LOW (ref 36.0–46.0)
Hemoglobin: 10.1 g/dL — ABNORMAL LOW (ref 12.0–15.0)
Immature Granulocytes: 1 %
Lymphocytes Relative: 5 %
Lymphs Abs: 0.4 10*3/uL — ABNORMAL LOW (ref 0.7–4.0)
MCH: 26 pg (ref 26.0–34.0)
MCHC: 30.5 g/dL (ref 30.0–36.0)
MCV: 85.1 fL (ref 80.0–100.0)
Monocytes Absolute: 0.7 10*3/uL (ref 0.1–1.0)
Monocytes Relative: 9 %
Neutro Abs: 6.4 10*3/uL (ref 1.7–7.7)
Neutrophils Relative %: 84 %
Platelets: 287 10*3/uL (ref 150–400)
RBC: 3.89 MIL/uL (ref 3.87–5.11)
RDW: 14.9 % (ref 11.5–15.5)
WBC: 7.7 10*3/uL (ref 4.0–10.5)
nRBC: 0 % (ref 0.0–0.2)

## 2021-08-05 LAB — GLUCOSE, CAPILLARY
Glucose-Capillary: 245 mg/dL — ABNORMAL HIGH (ref 70–99)
Glucose-Capillary: 282 mg/dL — ABNORMAL HIGH (ref 70–99)
Glucose-Capillary: 185 mg/dL — ABNORMAL HIGH (ref 70–99)
Glucose-Capillary: 172 mg/dL — ABNORMAL HIGH (ref 70–99)

## 2021-08-05 LAB — PHOSPHORUS: Phosphorus: 2.3 mg/dL — ABNORMAL LOW (ref 2.5–4.6)

## 2021-08-05 MED ORDER — ONDANSETRON HCL 4 MG/2ML IJ SOLN
4.0000 mg | Freq: Four times a day (QID) | INTRAMUSCULAR | Status: DC | PRN
  Administered 2021-08-05: 4 mg via INTRAVENOUS
  Filled 2021-08-05: qty 2

## 2021-08-05 MED ORDER — AMOXICILLIN-POT CLAVULANATE 875-125 MG PO TABS: 1.0000 | ORAL_TABLET | Freq: Two times a day (BID) | ORAL | Status: DC

## 2021-08-05 MED ORDER — SODIUM CHLORIDE 0.9 % IV SOLN
2.0000 g | INTRAVENOUS | Status: DC
  Administered 2021-08-05 – 2021-08-06 (×2): 2 g via INTRAVENOUS
  Filled 2021-08-05 (×2): qty 20

## 2021-08-05 NOTE — Progress Notes (Signed)
Regarding order for AFB placed on 4/13, per discussion w/ Dr Lamonte Sakai, the lab already has the sample and this was an add on order from the bronch a couple days ago.   ?

## 2021-08-05 NOTE — Consult Note (Addendum)
?  [name]  ? ? ?Date of Admission:  08/02/2021    ? ?Reason for Consult: Abnormal lung CT ?    ?Referring Physician: Dr Lamonte Sakai ? ?ASSESSMENT:   ? ?66 y.o. female admitted with ongoing pulmonary symptoms with CT imaging noting progressive groundglass opacities bilaterally.  She has been on rituximab infusions since January 2021 with her last infusion being in November 2022.  Differential diagnosis in the setting of this immunosuppression remains fairly broad to include atypical bacterial, fungal, and mycobacterial infections.  BAL and other studies are as noted below.  I would also consider pulmonary toxicity related to rituximab to remain on the differential despite her last dose being in November 2022 as she had symptoms dating back to that time without any imaging of her chest to assess for radiographic changes.  Additionally, rituximab causes significant B-cell depletion with recovery usually not starting to occur until 6 to 9 months after completion of therapy and this may take longer in her case as she was on therapy for approximately 2 years.  Nonetheless, would consider this a diagnosis of exclusion and believe that infectious etiologies need to be appropriately ruled out. ? ?Discussed her cytology findings with Dr. Thornell Sartorius in pathology who read her slides.  States that the organisms noted are typically consistent with what is seen on tonsillar scrapings and not consistent with invasive infection.  Suspect this is likely nonpathogenic as her presentation would be atypical of actinomyces infection and her CT would not support invasive actinomyces infection either.  Bacterial BAL cultures with Streptococcus mitis/oralis also of unclear significance given her overall presentation, however, will add ceftriaxone for now.  Incidentally, this will also provide actinomyces coverage although do not suspect this to be significant as noted above. ? ?Other potential etiologies in the  differential include TB which I think is less likely versus possible NTM.  QuantiFERON gold and AFB cultures are currently pending.  Nocardia also on the differential and hopefully if present will grow in cultures as Gram stain was negative and unclear if any correlation between her cytology findings.  Aspergillus galactomman from BAL is pending.  Doubt PCP given her lack of hypoxia and chronicity of symptoms as typically presents more fulminantly in non-HIV patients.  Additionally, LDH is negative but Fungitell should also be informative.  ? ?Reccs: ?- start Ceftriaxone ?- follow BAL studies and cultures ?- modified AFB staining to r/o nocardia ?- send histo, legionella, and strep urine antigens (ordered) ?- check blasto and cocci serologies ?- would consider immunotherapy adverse reaction despite last dose in November based on her symptom onset and duration of B-cell depletion with rituximab ?- will follow ? ? ?--------------------- ? ?BAL studies: ?-Aspergillus galactomannan pending ?-AFB smear negative ?-AFB culture pending ?-Viral culture negative ?-BAL bacterial cultures = Streptococcus mitis/oralis ?-Cytology with no malignant cells identified.  Microorganisms noted with features suggestive of actinomyces ? ?Micro: ?-Blood cultures x2 no growth to date (4/10) ?-COVID and flu PCR negative ?-Respiratory pathogen panel negative ?-Histoplasma urine antigen pending ?-Legionella urine antigen pending ?-Strep urine antigen pending ? ?Other studies: ?-ESR 75 ?-Fungitell pending ?-LDH 135 ?-QuantiFERON gold pending ? ? ? ?Principal Problem: ?  Acute respiratory failure with hypoxia ([place]) ?Active Problems: ?  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  Dyspnea ?  HTN (hypertension) ?  Type 2 diabetes mellitus ([place]) ?  Hypokalemia ?  Achalasia ?  Dyspnea on exertion ?  Uncontrolled type 2 diabetes mellitus  with hyperglycemia ([place]) ? ? ?MEDICATIONS:   ? ?Scheduled Meds: ? enoxaparin (LOVENOX)  injection  40 mg Subcutaneous Q24H  ? insulin aspart  0-5 Units Subcutaneous QHS  ? insulin aspart  0-9 Units Subcutaneous TID WC  ? insulin detemir  5 Units Subcutaneous QHS  ? losartan  50 mg Oral Daily  ? metoprolol succinate  50 mg Oral Daily  ? ?Continuous Infusions: ? cefTRIAXone (ROCEPHIN)  IV    ? ?PRN Meds:.acetaminophen, guaiFENesin, ipratropium-albuterol, menthol-cetylpyridinium ? ?HPI:   ? ?Meghan Alexander is a 66 y.o. female with a past medical history of follicular lymphoma previously treated with Bendamustine and rituximab chemotherapy followed by 18 months further of rituximab maintenance.  Her last dose of rituximab was on 02/25/2021.  This was held in the setting of concern for infections and diabetes per hematology office visit note from 07/16/2021. ? ?Patient is admitted at this time after presenting to the emergency department on 4/10 with a chief complaint of shortness of breath.  Patient states that she has been having respiratory issues since January 2023 that consists of mostly dry cough, shortness of breath particularly with exertion, and the inability to take a deep full inspiration.  She also reports night sweats during this period of time as well.  She was evaluated by her primary care physician in January and prescribed a course of antibiotics.  When her symptoms persisted, she was treated empirically for possible allergies.  However, her symptoms continued and she underwent imaging of her chest, abdomen, pelvis on 3/22.  The abdomen and pelvis imaging was obtained for the purposes of follow-up on her achalasia.  Chest imaging at that time noted extensive multifocal bilateral groundglass opacities with interstitial thickening favored to reflect an infection or inflammatory process.  She was seen the following day via telephone by her hematologist who prescribed a 5-day course of Levaquin and referred her to pulmonary.  She was actually scheduled for a pulmonary consultation in the clinic  on 4/19 prior to getting admitted.  She had a repeat CT of the chest on 4/4 as well to follow-up her March imaging which showed worsening of the peribronchovascular groundglass with associated bronchiectasis.  It was felt that drug-related toxicity was unlikely given her last dose being in November 2022. ? ?She was subsequently admitted from the emergency department.  Pulmonary was consulted.  She underwent bronchoscopy with Dr. Lamonte Sakai on 4/11.  Based on the operative note her right-sided and left-sided airways appeared normal.  Cultures and cytology was sent as noted above with other results pending.  We have been consulted for further recommendations. ? ?Of note, patient actually reports ongoing respiratory symptoms since at least October 2022.  There is a telephone note from 10/3 which indicates she was having trouble breathing and coughing for several days.  At that time she had tested negative for COVID x2.  During her follow-up visit with hematology on 11/3 it is noted noted that she had a significant bronchitis about approximately 4 to 6 weeks prior.  This was treated by her PCP with antibiotics.  Symptoms at that time were reportedly resolved, however, she believes they may have been persistent and may be she downplayed them due to her stress that she had at the time surrounding her [place] responsibilities.  She did not have any imaging of her chest during that time and reports the first recent imaging was not until March 2023.  She is also concerned regarding development of elevated temperatures overnight.  She reports  that her normal baseline temperature is 97 and she developed a temperature of 99.8 which subsequently returned back to her normal baseline. ? ?Patient reports that she is originally from the [place] in [place].  She has no significant history of travel abroad to TB endemic areas and has never lived abroad for prolonged period of time.  She has no known exposure to people with active  pulmonary TB.  She is retired Research scientist (life sciences).  She herself has never been homeless. ? ? ?Past Medical History:  ?Diagnosis Date  ? Allergy   ? year around allergies  ? Arthritis   ? fingers, knees, neck, back-osteoa

## 2021-08-05 NOTE — Progress Notes (Signed)
? ?NAME:  Meghan Alexander, MRN:  [id number], DOB:  1955-08-08, LOS: 2 ?ADMISSION DATE:  08/02/2021, CONSULTATION DATE:  08/02/21 ?REFERRING MD:  Melina Copa- EM, CHIEF COMPLAINT:  SOB ? ?History of Present Illness:  ? ?66 yo F PMH follicular lymphoma sp chemo, achalasia, who presented to [facility] 4/10 with progressive SOB, cough.  ?Patient has actually been referred to pulm for evaluation of SOB and abnormal CT scans and is scheduled for initial appointment 08/11/21-- In 07/14/2021 pt CT chest with GGOs, and repeat CT chest 07/27/21 with progression of GGOs.  ? ?Regarding ED presentation -- cough has been longstanding, SOB more recent over past month and progressive the last week. Associated chills, night sweats, back pain, fatigue over last week. Endorses wt loss (34lb over 3 mo) but attributes to achalasia. No fever, sick contacts, travel. She has had 3 recent courses of abx for possible PNA as etiology of GGO and SOB, without improvement in sx ? ?Follows with Dr. Irene Limbo in [place] -- completed bendamustin, rituximab and subsequent 28morituximab maintenance. No further maintenance planned right now ? ?PCCM is consulted in this setting  ? ?Pertinent  Medical History  ?Follicular lymphoma ?Achalasia  ?NASH ? ?Significant Hospital Events: ?Including procedures, antibiotic start and stop dates in addition to other pertinent events   ?4/10 ED for progressive SOB + new night sweats, and known GGOs on CT  ?Bronchoscopy with BAL and transbronchial brushings on 4/11 ?TBbrushings 4/11 >> lymphs, PMN's, no malignancy ?RML BAL 4/11 >> PMN, lymphs, suspected actinomyces ?RML AFB and fungal 4/11 >> negative so far ? ?Interim History / Subjective:  ?Tolerated bronchoscopy 4/11 ?On room air ?Still having shortness of breath ? ?Objective   ?Blood pressure 130/79, pulse (!) 107, temperature 99.2 ?F (37.3 ?C), temperature source Oral, resp. rate 18, SpO2 96 %. ?   ?   ?No intake or output data in the 24 hours ending 08/05/21 0955 ? ?There were no vitals  filed for this visit. ? ?Examination: ?General: Comfortable, laying in bed, no distress ?HENT: Clear, no stridor ?Lungs: Clear bilaterally ?Cardiovascular: Regular, no murmur ?Abdomen: Nondistended ?Extremities: No edema ?Neuro: Nonfocal ?GU: defer ? ?Resolved Hospital Problem list   ? ? ?Assessment & Plan:  ? ?Abnormal CT chest ?-GGOs have progressed from 06/2021 CT to 07/2021 CT.  Timing wise, unlikely to be lung tox from rituximab (last given 02/2021, had tolerated well during tx)  ?-Right middle lobe BAL 4/11 with organisms consistent with actinomyces, AFB smear negative, AFB culture pending.  We will need to speak with lab to ensure that this is followed for actinomyces vs nocardia > modified AFB staining ?P ?-Suspect we should initiate therapy for actinomyces vs nocardia based on the data we have obtained.  Will discuss with ID regarding abx - empiric therapy vs wait for confirmatory modified AFB staining. Ceftriaxone would be a reasonable choice to treat either nocardia or actinomyces while we figure this out ?-Hold off on corticosteroids ? ? ?Best Practice (right click and "Reselect all SmartList Selections" daily)  ? ?Per primary  ? ?Labs   ?CBC: ?Recent Labs  ?Lab 08/02/21 ?1348 08/03/21 ?0800 08/04/21 ?0205 08/05/21 ?04627 ?WBC 8.4 7.8 8.8 7.7  ?NEUTROABS 7.2 6.3 7.4 6.4  ?HGB 11.3* 11.0* 9.3* 10.1*  ?HCT 36.3 34.9* 28.9* 33.1*  ?MCV 83.6 83.7 82.8 85.1  ?PLT 340 329 229 287  ? ? ?Basic Metabolic Panel: ?Recent Labs  ?Lab 08/02/21 ?1348 08/03/21 ?0629 08/03/21 ?0800 08/04/21 ?0205 08/05/21 ?00350 ?NA 137 139  --  136 137  ?K 3.1* 2.9*  --  3.9 4.2  ?CL 98 100  --  101 101  ?CO2 28 30  --  28 29  ?GLUCOSE 237* 163*  --  141* 188*  ?BUN 11 8  --  <5* 5*  ?CREATININE 0.90 0.73  --  0.81 0.90  ?CALCIUM 9.1 8.4*  --  8.1* 8.6*  ?MG  --   --  1.8 2.0 2.0  ?PHOS  --   --  3.0 2.3* 2.3*  ? ?GFR: ?CrCl cannot be calculated (Unknown ideal weight.). ?Recent Labs  ?Lab 08/02/21 ?1348 08/03/21 ?0800 08/04/21 ?0205  08/05/21 ?8003  ?WBC 8.4 7.8 8.8 7.7  ? ? ?Liver Function Tests: ?Recent Labs  ?Lab 08/02/21 ?1348 08/04/21 ?0205 08/05/21 ?4917  ?AST _0 ?ALT _1 ?ALKPHOS 76 66 80  ?BILITOT 0.6 0.7 0.3  ?PROT 6.5 5.2* 6.1*  ?ALBUMIN 3.0* 2.5* 2.8*  ? ?No results for input(s): LIPASE, AMYLASE in the last 168 hours. ?No results for input(s): AMMONIA in the last 168 hours. ? ?ABG ?No results found for: PHART, PCO2ART, PO2ART, HCO3, TCO2, ACIDBASEDEF, O2SAT  ? ?Coagulation Profile: ?Recent Labs  ?Lab 08/02/21 ?1538  ?INR 1.1  ? ? ?Cardiac Enzymes: ?No results for input(s): CKTOTAL, CKMB, CKMBINDEX, TROPONINI in the last 168 hours. ? ?HbA1C: ?No results found for: HGBA1C ? ?CBG: ?Recent Labs  ?Lab 08/04/21 ?0743 08/04/21 ?1143 08/04/21 ?1537 08/04/21 ?2021 08/05/21 ?9150  ?GLUCAP 173* 325* 197* 314* 245*  ? ? ? ?Critical care time: n/a ?  ? ?Baltazar Apo, MD, PhD ?08/05/2021, 9:55 AM ?[facility] ?[phone] or if no answer before 7:00PM call [phone] ?For any issues after 7:00PM please call eLink [phone] ? ? ?

## 2021-08-05 NOTE — Progress Notes (Signed)
Inpatient Diabetes Program Recommendations ? ?AACE/ADA: New Consensus Statement on Inpatient Glycemic Control (2015) ? ?Target Ranges:  Prepandial:   less than 140 mg/dL ?     Peak postprandial:   less than 180 mg/dL (1-2 hours) ?     Critically ill patients:  140 - 180 mg/dL  ? ?Lab Results  ?Component Value Date  ? GLUCAP 245 (H) 08/05/2021  ? ? ?Review of Glycemic Control ? Latest Reference Range & Units 08/04/21 07:43 08/04/21 11:43 08/04/21 15:37 08/04/21 20:21 08/05/21 08:23  ?Glucose-Capillary 70 - 99 mg/dL 173 (H) 325 (H) 197 (H) 314 (H) 245 (H)  ? ?Diabetes history: DM 2 ?Outpatient Diabetes medications: Metformin 500 mg Daily ?Current orders for Inpatient glycemic control: Novolog 0-9 units tid + hs ?Levemir 5 units qhs ? ?Inpatient Diabetes Program Recommendations:   ? ?-  Add Novolog 4 units tid meal coverage if eating >50% of meals ? ?Thanks, ? ?Tama Headings RN, MSN, BC-ADM ?Inpatient Diabetes Coordinator ?Team Pager [phone] (8a-5p) ? ?

## 2021-08-05 NOTE — Progress Notes (Signed)
?PROGRESS NOTE ? ? ? ?Meghan Alexander  KKX:[id number] DOB: [birth year] DOA: 08/02/2021 ?PCP: [name], MD  ? ? ? ?Brief Narrative:  ?66 y.o. WF PMHx Stage III/IV follicular lymphoma completed chemotherapy and immunotherapy, achalasia with chronic nausea S/p Heller myotomy with fundoplication in 1696, HTN, uncontrolled Type 2 diabetes  ? ?Presents with worsening dyspnea on exertion and hypoxia.  ?  ?Pt had symptoms of pneumonia in 04/2021 and treated with Keflex by PCP but continued to have persistent symptom of shortness of breath with exertion and nonproductive cough.  She later had a virtual visit for continued symptoms and was treated for allergies with no relief. She then had CT chest/abd/pelvis imaging obtained by oncology for lymphoma surveillance on 07/14/21 had incidental finding bilateral extensive multifocal ground glass opacity with interstitial thickening. 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 Referral was also made to pulmonary with appointment later this week.  ?  ?She had repeat CT chest without contrast on 4/4 by GI to assess for a planned heller myotomy surgery for her achalasia and showed worsening peribronchovascular groundglass with associated bronchiectasis, minimal consolidation and scattered volume loss, finding which may be due to an atypical or viral pneumonia.  This was treated again with another course of antibiotics by her PCP.  However she continued to have worsening symptoms and this week also noted intermittent anterior chest pain and upper back pain. Night sweats daily requiring her to change clothes 3x in the night. Has noticed blood when blowing her nose.  Has had drops in her oxygen saturation from 82 to 94% and decided to present to the ED. ?She denies history of tobacco use.  No recent travels or sick contacts. ?  ?In the ED, she was afebrile normotensive and stable on room air.  No leukocytosis with  mild anemia with hemoglobin 11.3.  Sodium 137, K of 3.1, glucose of 237, normal creatinine of 0.9.  Sed rate was elevated at 75, D-dimer mildly elevated 0.66, negative influenza PCR and COVID PCR. ?Pulmonology was consulted by ED physician who has plans to take her for bronchoscopy tomorrow. ? ? ?Subjective: ?4/13 A/O x4.  Positive dyspnea on exertion.  Positive paroxysms of coughing with speaking. ? ? ?Assessment & Plan: ?Covid vaccination; vaccinated 4/4 ?  ?Principal Problem: ?  Acute respiratory failure with hypoxia ([place]) ?Active Problems: ?  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  Dyspnea ?  HTN (hypertension) ?  Type 2 diabetes mellitus ([place]) ?  Hypokalemia ?  Achalasia ?  Dyspnea on exertion ?  Uncontrolled type 2 diabetes mellitus with hyperglycemia ([place]) ? ?Acute respiratory failure with hypoxia ([facility])/Positive Conversational Dyspnea ?Presented with hypoxia down to 84% with ambulation but stable at rest.  Had symptoms of persistent exertional dyspnea with nonproductive cough since January and has been treated on 3 different courses of antibiotics.  Had CT chest imaging on 07/14/2021 showing extensive bilateral multifocal groundglass opacity with interstitial thickening.  CT chest without contrast on 4/4 showed worsening peribronchovascular groundglass with associated bronchiectasis.  Originally had outpatient pulmonology follow-up this week. ?-Pulmonology consulted and has obtained Quantiferon gold, glomerular basement member antibiotics, aspergillus Ag, Fungitell, strep pneumoniae urine antigen, Legionella pneumo and histoplasm antigen ?-No antibiotics or steroids per pulmonology ?- Flu/COVID PCR negative.  Respiratory viral panel negative. ?-4/11 s/p Bronchoscopy with BAL positive conversational dyspnea ?-4/11 DuoNeb QID ?-4/11 s/p continuous pulse ox ?- 4/11 titrate O2 to maintain  SPO2> 92% ?SATURATION QUALIFICATIONS: (This note is used to comply with regulatory documentation  for home oxygen) ?Patient Saturations on Room Air at Rest = 97% ?Patient Saturations on Room Air while Ambulating = 95% ?Patient Saturations on NA Liters of oxygen while Ambulating = NA% - Pt did not require O2 while ambulating.  ?Please briefly explain why patient needs home oxygen: ?-4/12 patient does not meet requirements for home O2 ? ?positive STREPTOCOCCUS MITIS/ORALIS pneumonia ?- 12/13 discussed case with Dr. Jule Ser, ID recommended Augmentin 14-day course. ?- 12/13 if patient tolerates Augmentin will DC on 12/14: Per ID not necessary to wait for remainder of infectious markers to return. ? ?New onset Sinus Tachycardia/Essential HTN ?-Losartan 50 mg daily ?- Toprol 50 mg daily ?-4/12 controlled ?  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 Irene Limbo with oncology ?-Completed Bendamustine Rituxan chemotherapy followed by 18 months of Rituxan maintenance with last dose 02/24/2021. No clinical or radiographic evidence of persistent follicular lymphoma as of recent scans 07/15/2020 ?-4/12 discussed case with Dr. Irene Limbo Oncology Dr N[facility] and he felt that immunotherapy was too distant in the past to be causing current respiratory symptoms.  ?  ?Achalasia ?-S/p Heller myotomy with fundoplication in 8315 at [facility]. Had plans for repeat of the procedure but this has been postponed due to recent pulmonary symptoms ?-Continue with thickened diet ?  ?Hypokalemia ?-Potassium goal> 4 ?-4/11 potassium IV 60 mEq + Potassium p.o. 40 mEq ? ?Hypomagnesmia ?- Magnesium goal> 2 ?- 4/11 magnesium IV 2 g ? ?DM type II uncontrolled with hyperglycemia ?-4/12 Levemir 5 units nightly ?-4/12 increase placed on sensitive SSI ?  ?  ? ?Mobility Assessment (last 72 hours)   ? ? Mobility Assessment   ? ? [place] Name 08/03/21 1727  ?  ?  ?  ?  ? Does patient have an order for bedrest or is patient medically unstable No - Continue assessment      ? What is the highest level of  mobility based on the progressive mobility assessment? Level 6 (Walks independently in room and hall) - Balance while walking in room without assist - Complete      ? ?  ?  ? ?  ? ? ?DVT prophylaxis: Lovenox ?Code Status: Full ?Family Communication:  ?Status is: Inpatient ? ? ? ?Dispo: The patient is from: Home ?             Anticipated d/c is to: Home ?             Anticipated d/c date is: 1 day ?             Patient currently is not medically stable to d/c. ? ? ? ? ? ?Consultants:  ?PCCM ? ? ?Procedures/Significant Events:  ?4/11 Bronchoscopy with BAL ? ? ? ?I have personally reviewed and interpreted all radiology studies and my findings are as above. ? ?VENTILATOR SETTINGS: ?Nasal cannula 4/12 ?Flow 2 L/min ?SPO2 92% ? ? ?Cultures ?4/10 Respiratory Virus Panel Negative ?4/10 SARS coronavirus negative ?4/11 BAL positive STREPTOCOCCUS MITIS/ORALIS  ? ? ? ? ?Antimicrobials: ?Anti-infectives (From admission, onward)  ? ? None  ? ?  ?  ? ? ?Devices ?  ? ?LINES / TUBES:  ? ? ? ? ?Continuous Infusions: ? ? ? ? ?Objective: ?Vitals:  ? 08/04/21 2018 08/04/21 2311 08/05/21 0352 08/05/21 0824  ?BP: 129/69 121/62 110/80 130/79  ?Pulse: 97 (!) 107 (!) 107   ?Resp:  _0 ?Temp: 99.5 ?F (37.5 ?C) 99.8 ?F (37.7 ?C) 99 ?F (37.2 ?C) 99.2 ?F (37.3 ?C)  ?TempSrc: Oral Oral Oral Oral  ?SpO2: 96% 96% 96%   ? ?No intake or output data in the 24 hours ending 08/05/21 1012 ? ?There were no vitals filed for this visit. ? ?Examination: ? ?General: Positive acute respiratory distress with exertion. ?Eyes: negative scleral hemorrhage, negative anisocoria, negative icterus ?ENT: Negative Runny nose, negative gingival bleeding, ?Neck:  Negative scars, masses, torticollis, lymphadenopathy, JVD ?Lungs: tachypnea with speech, poor air movement bilaterally, no crackles, positive conversational dyspnea, ?Cardiovascular: Regular rate and rhythm without murmur gallop or rub normal S1 and S2 ?Abdomen: negative abdominal pain, nondistended,  positive soft, bowel sounds, no rebound, no ascites, no appreciable mass ?Extremities: No significant cyanosis, clubbing, or edema bilateral lower extremities ?Skin: Negative rashes, lesions, ulcers ?Psychiatri

## 2021-08-06 DIAGNOSIS — K220 Achalasia of cardia: Secondary | ICD-10-CM | POA: Diagnosis not present

## 2021-08-06 DIAGNOSIS — C8200 Follicular lymphoma grade I, unspecified site: Secondary | ICD-10-CM | POA: Diagnosis not present

## 2021-08-06 DIAGNOSIS — J9601 Acute respiratory failure with hypoxia: Secondary | ICD-10-CM | POA: Diagnosis not present

## 2021-08-06 DIAGNOSIS — R0609 Other forms of dyspnea: Secondary | ICD-10-CM | POA: Diagnosis not present

## 2021-08-06 DIAGNOSIS — C8218 Follicular lymphoma grade II, lymph nodes of multiple sites: Secondary | ICD-10-CM | POA: Diagnosis not present

## 2021-08-06 LAB — CBC WITH DIFFERENTIAL/PLATELET
Abs Immature Granulocytes: 0.06 10*3/uL (ref 0.00–0.07)
Basophils Absolute: 0 10*3/uL (ref 0.0–0.1)
Basophils Relative: 0 %
Eosinophils Absolute: 0.1 10*3/uL (ref 0.0–0.5)
Eosinophils Relative: 1 %
HCT: 29.5 % — ABNORMAL LOW (ref 36.0–46.0)
Hemoglobin: 9.1 g/dL — ABNORMAL LOW (ref 12.0–15.0)
Immature Granulocytes: 1 %
Lymphocytes Relative: 6 %
Lymphs Abs: 0.3 10*3/uL — ABNORMAL LOW (ref 0.7–4.0)
MCH: 25.9 pg — ABNORMAL LOW (ref 26.0–34.0)
MCHC: 30.8 g/dL (ref 30.0–36.0)
MCV: 83.8 fL (ref 80.0–100.0)
Monocytes Absolute: 0.8 10*3/uL (ref 0.1–1.0)
Monocytes Relative: 16 %
Neutro Abs: 3.8 10*3/uL (ref 1.7–7.7)
Neutrophils Relative %: 76 %
Platelets: 242 10*3/uL (ref 150–400)
RBC: 3.52 MIL/uL — ABNORMAL LOW (ref 3.87–5.11)
RDW: 15 % (ref 11.5–15.5)
WBC: 5.1 10*3/uL (ref 4.0–10.5)
nRBC: 0 % (ref 0.0–0.2)

## 2021-08-06 LAB — AEROBIC/ANAEROBIC CULTURE W GRAM STAIN (SURGICAL/DEEP WOUND)

## 2021-08-06 LAB — COMPREHENSIVE METABOLIC PANEL
ALT: 25 U/L (ref 0–44)
AST: 21 U/L (ref 15–41)
Albumin: 2.5 g/dL — ABNORMAL LOW (ref 3.5–5.0)
Alkaline Phosphatase: 69 U/L (ref 38–126)
Anion gap: 6 (ref 5–15)
BUN: <5 mg/dL — ABNORMAL LOW (ref 8–23)
CO2: 30 mmol/L (ref 22–32)
Calcium: 8.7 mg/dL — ABNORMAL LOW (ref 8.9–10.3)
Chloride: 102 mmol/L (ref 98–111)
Creatinine, Ser: 0.72 mg/dL (ref 0.44–1.00)
GFR, Estimated: >60 mL/min (ref >60)
Glucose, Bld: 140 mg/dL — ABNORMAL HIGH (ref 70–99)
Potassium: 3.5 mmol/L (ref 3.5–5.1)
Sodium: 138 mmol/L (ref 135–145)
Total Bilirubin: 0.4 mg/dL (ref 0.3–1.2)
Total Protein: 5.7 g/dL — ABNORMAL LOW (ref 6.5–8.1)

## 2021-08-06 LAB — QUANTIFERON-TB GOLD PLUS (RQFGPL)
QuantiFERON Mitogen Value: >10 IU/mL
QuantiFERON Nil Value: 0.09 IU/mL
QuantiFERON TB1 Ag Value: 0.11 IU/mL
QuantiFERON TB2 Ag Value: 0.1 IU/mL

## 2021-08-06 LAB — ACID FAST SMEAR (AFB, MYCOBACTERIA): Acid Fast Smear: NEGATIVE

## 2021-08-06 LAB — PHOSPHORUS: Phosphorus: 3.7 mg/dL (ref 2.5–4.6)

## 2021-08-06 LAB — GLUCOSE, CAPILLARY
Glucose-Capillary: 152 mg/dL — ABNORMAL HIGH (ref 70–99)
Glucose-Capillary: 168 mg/dL — ABNORMAL HIGH (ref 70–99)
Glucose-Capillary: 169 mg/dL — ABNORMAL HIGH (ref 70–99)
Glucose-Capillary: 265 mg/dL — ABNORMAL HIGH (ref 70–99)

## 2021-08-06 LAB — QUANTIFERON-TB GOLD PLUS: QuantiFERON-TB Gold Plus: NEGATIVE

## 2021-08-06 LAB — MAGNESIUM: Magnesium: 2 mg/dL (ref 1.7–2.4)

## 2021-08-06 MED ORDER — PHENOL 1.4 % MT LIQD
1.0000 | OROMUCOSAL | Status: DC | PRN
  Administered 2021-08-07: 1 via OROMUCOSAL
  Filled 2021-08-06: qty 177

## 2021-08-06 MED ORDER — IPRATROPIUM-ALBUTEROL 0.5-2.5 (3) MG/3ML IN SOLN
3.0000 mL | Freq: Four times a day (QID) | RESPIRATORY_TRACT | Status: DC
  Administered 2021-08-06 – 2021-08-08 (×6): 3 mL via RESPIRATORY_TRACT
  Filled 2021-08-06 (×13): qty 3

## 2021-08-06 MED ORDER — ALBUTEROL SULFATE (2.5 MG/3ML) 0.083% IN NEBU: 2.5000 mg | INHALATION_SOLUTION | Freq: Four times a day (QID) | RESPIRATORY_TRACT | Status: DC | PRN

## 2021-08-06 MED ORDER — METHYLPREDNISOLONE SODIUM SUCC 125 MG IJ SOLR
60.0000 mg | INTRAMUSCULAR | Status: DC
  Administered 2021-08-06 – 2021-08-07 (×2): 60 mg via INTRAVENOUS
  Filled 2021-08-06 (×2): qty 2

## 2021-08-06 NOTE — Progress Notes (Signed)
?   ? ?[facility] for [facility]ious Disease ? ?Date of Admission:  08/02/2021    ?       ?Reason for visit: Follow up on abnormal lung CT ? ?Current antibiotics: ?Ceftriaxone ? ?ASSESSMENT:   ? ?66 y.o. female admitted with pulmonary symptoms and CT imaging that showed progressive groundglass opacities bilaterally.  This is in the setting of rituximab infusions since January 2001 through November 9562 for follicular lymphoma with her symptoms of dry cough and shortness of breath starting approximately October 2022. ? ?The differential diagnosis in this setting of immunosuppression remains fairly broad and would include typical and atypical bacterial infection, fungal, and mycobacterial infections. ? ?BAL and other studies are as noted below.  Would also consider the possibility of pulmonary toxicity secondary to rituximab on the differential despite her last dose having been in November 2022 given that she had symptoms at that time without imaging of her chest to assess for any radiographic changes.  Additionally rituximab causes significant B-cell depletion with recovery usually not starting for several months after completion which may even take longer in her case as she was on therapy for nearly 2 years.  Nonetheless, consider this to be a diagnosis of exclusion and believe that infectious etiologies need to be ruled out. ? ?I did discuss her cytology findings with pathology yesterday who read her slides.  They state that the organisms noted in cytology are typically consistent with what they see on tonsillar scrapings and would not be consistent with invasive infection.  Her imaging does not appear consistent with classic actinomyces crossing tissue planes.  Although actinomyces and nocardia can be indistinguishable on Gram stain I am not sure this correlates to what was seen on cytology.  Modified acid-fast staining is attempting to be obtained.  Her BAL Gram stain did indicate gram-positive rods as well as  gram-positive cocci.  Culture at this time has yielded Streptococcus mitis/oralis and is holding for potential anaerobe.  This could potentially be actinomyces. ? ?AFB cultures are pending for possible NTM infection and would consider MTB to be unlikely with her presentation and negative QuantiFERON.  Aspergillus galactomannan from BAL is pending but Fungitell is negative which is reassuring. ? ? ?RECOMMENDATIONS:   ? ?Continue ceftriaxone ?Follow BAL cultures for possible anaerobe identification ?Ceftriaxone is currently covering strep mitis as well as possible actinomyces ?Pending histo, Legionella, strep urine antigens ?Pending blasto and cocci serologies although would consider this less likely ?Consider immunotherapy adverse reaction ?Will follow.  Dr. Candiss Norse available as needed over the weekend.  Otherwise I will return on Monday if she remains inpatient. ?If she were to discharge, I think transitioning to Augmentin x14 days is reasonable.  This would cover actinomyces (if it grows) and I believe will also provide adequate strep mitis coverage given the intermediate MIC is on the low side ?Don't think that she would need months of PCN as is typically given for classic actinomyces as her presentation appears more c/w non-classic actinomyces (polymicrobial infection, absence of involvement of the chest wall or other contiguous structures, lack of fibrotic reaction/sulfur granules) ? ? ?Principal Problem: ?  Acute respiratory failure with hypoxia ([place]) ?Active Problems: ?  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) ?  Dyspnea ?  HTN (hypertension) ?  Type 2 diabetes mellitus ([place]) ?  Hypokalemia ?  Achalasia ?  Dyspnea on exertion ?  Uncontrolled type 2 diabetes mellitus with hyperglycemia ([place]) ? ? ? ?MEDICATIONS:   ? ?  Scheduled Meds: ? enoxaparin (LOVENOX) injection  40 mg Subcutaneous Q24H  ? insulin aspart  0-5 Units Subcutaneous QHS  ? insulin aspart  0-9 Units Subcutaneous TID WC  ?  insulin detemir  5 Units Subcutaneous QHS  ? losartan  50 mg Oral Daily  ? metoprolol succinate  50 mg Oral Daily  ? ?Continuous Infusions: ? cefTRIAXone (ROCEPHIN)  IV 2 g (08/05/21 1439)  ? ?PRN Meds:.acetaminophen, guaiFENesin, ipratropium-albuterol, menthol-cetylpyridinium, ondansetron (ZOFRAN) IV ? ?SUBJECTIVE:  ? ?24 hour events:  ?No overnight events noted ?Stable vitals ?Saturating 93 to 96% on room air ?Tmax 99.8 ?Normal WBC unremarkable CMP ?Coccidioides and Blastomyces labs sent out to [facility] ?BAL cultures holding for possible anaerobe in addition to already identified Streptococcus mitis ?QuantiFERON was negative ?Aspergillus galactomannan still pending ?Fungitell was negative ?She is currently on ceftriaxone which was started yesterday ? ?She reports feeling hot and that she was going to have a fever yesterday when her temperature was 99.  She reports that she thinks her breathing is improved today.  She still has some cough with conversation. ? ?Review of Systems  ?All other systems reviewed and are negative. ? ?  ?OBJECTIVE:  ? ?Blood pressure 126/80, pulse 96, temperature 98.2 ?F (36.8 ?C), temperature source Oral, resp. rate 18, SpO2 94 %. ?There is no height or weight on file to calculate BMI. ? ?Physical Exam ?Constitutional:   ?   General: She is not in acute distress. ?   Appearance: Normal appearance.  ?HENT:  ?   Head: Normocephalic and atraumatic.  ?Eyes:  ?   Extraocular Movements: Extraocular movements intact.  ?   Conjunctiva/sclera: Conjunctivae normal.  ?Pulmonary:  ?   Effort: Pulmonary effort is normal. No respiratory distress.  ?Abdominal:  ?   General: There is no distension.  ?   Palpations: Abdomen is soft.  ?Musculoskeletal:     ?   General: Normal range of motion.  ?   Cervical back: Normal range of motion and neck supple.  ?Skin: ?   General: Skin is warm and dry.  ?   Findings: No rash.  ?Neurological:  ?   General: No focal deficit present.  ?   Mental Status: She is alert and  oriented to person, place, and time.  ?Psychiatric:     ?   Mood and Affect: Mood normal.     ?   Behavior: Behavior normal.  ? ? ? ?Lab Results: ?Lab Results  ?Component Value Date  ? WBC 5.1 08/06/2021  ? HGB 9.1 (L) 08/06/2021  ? HCT 29.5 (L) 08/06/2021  ? MCV 83.8 08/06/2021  ? PLT 242 08/06/2021  ?  ?Lab Results  ?Component Value Date  ? NA 138 08/06/2021  ? K 3.5 08/06/2021  ? CO2 30 08/06/2021  ? GLUCOSE 140 (H) 08/06/2021  ? BUN <5 (L) 08/06/2021  ? CREATININE 0.72 08/06/2021  ? CALCIUM 8.7 (L) 08/06/2021  ? GFRNONAA >60 08/06/2021  ? GFRAA >60 12/05/2019  ?  ?Lab Results  ?Component Value Date  ? ALT 25 08/06/2021  ? AST 21 08/06/2021  ? ALKPHOS 69 08/06/2021  ? BILITOT 0.4 08/06/2021  ? ? ?No results found for: CRP ? ?   ?Component Value Date/Time  ? ESRSEDRATE 75 (H) 08/02/2021 1538  ? ?  ?I have reviewed the micro and lab results in Epic. ? ?Imaging: ?No results found.  ? ?Imaging independently reviewed in Epic.  ? ? ?Mignon Pine ?[place] for Infectious Disease ?[facility] ?[phone] pager ?08/06/2021, 8:28 AM ? ?I have personally spent 50 minutes involved in face-to-face and non-face-to-face activities for this patient on the day of the visit. Professional time spent includes the following activities: Preparing to see the patient (review of tests), Obtaining and/or reviewing separately obtained history (admission/discharge record), Performing a medically appropriate examination and/or evaluation , Ordering medications/tests/procedures, referring and communicating with other health care professionals, Documenting clinical information in the EMR, Independently interpreting results (not separately reported), Communicating results to the patient/family/caregiver, Counseling and educating the patient/family/caregiver and Care coordination (not separately reported).  ? ? ?

## 2021-08-06 NOTE — Progress Notes (Addendum)
? ?NAME:  Meghan Alexander, MRN:  [id number], DOB:  02-22-1956, LOS: 3 ?ADMISSION DATE:  08/02/2021, CONSULTATION DATE:  08/02/21 ?REFERRING MD:  Meghan Alexander- EM, CHIEF COMPLAINT:  SOB ? ?History of Present Illness:  ? ?66 yo F PMH follicular lymphoma sp chemo, achalasia, who presented to [facility] 4/10 with progressive SOB, cough.  ?Patient has actually been referred to pulm for evaluation of SOB and abnormal CT scans and is scheduled for initial appointment 08/11/21-- In 07/14/2021 pt CT chest with GGOs, and repeat CT chest 07/27/21 with progression of GGOs.  ? ?Regarding ED presentation -- cough has been longstanding, SOB more recent over past month and progressive the last week. Associated chills, night sweats, back pain, fatigue over last week. Endorses wt loss (34lb over 3 mo) but attributes to achalasia. No fever, sick contacts, travel. She has had 3 recent courses of abx for possible PNA as etiology of GGO and SOB, without improvement in sx ? ?Follows with Dr. Irene Limbo in [place] -- completed bendamustin, rituximab and subsequent 46morituximab maintenance. No further maintenance planned right now ? ?PCCM is consulted in this setting  ? ?Pertinent  Medical History  ?Follicular lymphoma ?Achalasia  ?NASH ? ?Significant Hospital Events: ?Including procedures, antibiotic start and stop dates in addition to other pertinent events   ?4/10 ED for progressive SOB + new night sweats, and known GGOs on CT  ?Bronchoscopy with BAL and transbronchial brushings on 4/11 ?TBbrushings 4/11 >> lymphs, PMN's, no malignancy ?RML BAL 4/11 >> PMN, lymphs, suspected actinomyces ?RML AFB and fungal 4/11 >> negative so far ? ?Interim History / Subjective:  ?Tolerated bronchoscopy 4/11 ?On room air ?Still having shortness of breath ? ?Objective   ?Blood pressure 126/80, pulse 96, temperature 98.2 ?F (36.8 ?C), temperature source Oral, resp. rate 18, SpO2 94 %. ?   ?   ?No intake or output data in the 24 hours ending 08/06/21 1055 ? ?There were no vitals filed  for this visit. ? ?Examination: ?General: Well-nourished well-developed ?HEENT: MM pink/moist no JVD ?Neuro: Grossly intact without focal defect very upbeat today ?CV: Heart sounds are distant ?PULM: Decreased in the bases ?GI: soft, bsx4 active  ?GU: Amber urine ?Extremities: warm/dry, negative edema  ?Skin: no rashes or lesions ? ?Resolved Hospital Problem list   ? ? ?Assessment & Plan:  ? ?Abnormal CT chest ?-GGOs have progressed from 06/2021 CT to 07/2021 CT.  Timing wise, unlikely to be lung tox from rituximab (last given 02/2021, had tolerated well during tx)  ?-Right middle lobe BAL 4/11 with organisms consistent with actinomyces, AFB smear negative, AFB culture pending.  We will need to speak with lab to ensure that this is followed for actinomyces vs nocardia > modified AFB staining ?P ?Continue current therapy with antibiotics ceftriaxone ?No steroids at this time ?Appreciate IDs and input for treatment of actinomyces vs nocardia ? ? ? ?Best Practice (right click and "Reselect all SmartList Selections" daily)  ? ?Per primary  ? ?Labs   ?CBC: ?Recent Labs  ?Lab 08/02/21 ?1348 08/03/21 ?0800 08/04/21 ?0205 08/05/21 ?0102704/14/23 ?0145  ?WBC 8.4 7.8 8.8 7.7 5.1  ?NEUTROABS 7.2 6.3 7.4 6.4 3.8  ?HGB 11.3* 11.0* 9.3* 10.1* 9.1*  ?HCT 36.3 34.9* 28.9* 33.1* 29.5*  ?MCV 83.6 83.7 82.8 85.1 83.8  ?PLT 340 329 229 287 242  ? ? ?Basic Metabolic Panel: ?Recent Labs  ?Lab 08/02/21 ?1348 08/03/21 ?0629 08/03/21 ?0800 08/04/21 ?0205 08/05/21 ?0253604/14/23 ?0145  ?NA 137 139  --  136 137 138  ?  K 3.1* 2.9*  --  3.9 4.2 3.5  ?CL 98 100  --  101 101 102  ?CO2 28 30  --  _0 ?GLUCOSE 237* 163*  --  141* 188* 140*  ?BUN 11 8  --  <5* 5* <5*  ?CREATININE 0.90 0.73  --  0.81 0.90 0.72  ?CALCIUM 9.1 8.4*  --  8.1* 8.6* 8.7*  ?MG  --   --  1.8 2.0 2.0 2.0  ?PHOS  --   --  3.0 2.3* 2.3* 3.7  ? ?GFR: ?CrCl cannot be calculated (Unknown ideal weight.). ?Recent Labs  ?Lab 08/03/21 ?0800 08/04/21 ?0205 08/05/21 ?8768  08/06/21 ?0145  ?WBC 7.8 8.8 7.7 5.1  ? ? ?Liver Function Tests: ?Recent Labs  ?Lab 08/02/21 ?1348 08/04/21 ?0205 08/05/21 ?1157 08/06/21 ?0145  ?AST _1 ?ALT _2 ?ALKPHOS 76 66 80 69  ?BILITOT 0.6 0.7 0.3 0.4  ?PROT 6.5 5.2* 6.1* 5.7*  ?ALBUMIN 3.0* 2.5* 2.8* 2.5*  ? ?No results for input(s): LIPASE, AMYLASE in the last 168 hours. ?No results for input(s): AMMONIA in the last 168 hours. ? ?ABG ?No results found for: PHART, PCO2ART, PO2ART, HCO3, TCO2, ACIDBASEDEF, O2SAT  ? ?Coagulation Profile: ?Recent Labs  ?Lab 08/02/21 ?1538  ?INR 1.1  ? ? ?Cardiac Enzymes: ?No results for input(s): CKTOTAL, CKMB, CKMBINDEX, TROPONINI in the last 168 hours. ? ?HbA1C: ?No results found for: HGBA1C ? ?CBG: ?Recent Labs  ?Lab 08/05/21 ?2620 08/05/21 ?1243 08/05/21 ?1628 08/05/21 ?2120 08/06/21 ?3559  ?GLUCAP 245* 282* 185* 172* 152*  ? ? ? ?Richardson Landry Minor ACNP ?Acute Care Nurse Practitioner ?[name] ?Please consult Amion ?08/06/2021, 10:55 AM ? ? ?I have taken an interval history, reviewed the chart and examined the patient. I agree with the Advanced Practitioner's note, impression, and recommendations as outlined.  ? ?Briefly, 66 year old female with CT imaging demonstrating GGO bilaterally. Hx of progressive shortness and unproductive cough. History of rituximab for follicular lymphoma, last given in Nov 2022.  ? ?S: No complaints. Working on her IS.  ? ?On exam, patient well-appearing, nontoxic, NAD. Lungs diminished, no rhonchi or wheezing. Heart RRR, no murmur. Extremities no edema, warm to touch. ? ?Abnormal CT concerning for infectious etiology versus drug toxicity (rituximab). RML concerning for actinomyces vs nocardia. AFB pending. BAL washing with strep mitis/oralis. ID following. Continue Ceftriaxone. F/u remaining cultures. ? ?Outpatient follow-up in 1 month for repeat imaging if indicated ? ?Pulmonary will sign off. Available as needed. ? ? Independent Critical Care Time: 25  Minutes.  ? ?Rodman Pickle, M.D. ?[facility] ?08/06/2021 6:50 PM  ? ?Please see Amion for pager number to reach on-call Pulmonary and Critical Care Team. ? ?

## 2021-08-06 NOTE — Progress Notes (Addendum)
?PROGRESS NOTE ? ? ? ?Meghan Alexander  NAT:[id number] DOB: Nov 10, 1955 DOA: 08/02/2021 ?PCP: [name], MD  ? ? ? ?Brief Narrative:  ?66 y.o. WF PMHx Stage III/IV follicular lymphoma completed chemotherapy and immunotherapy, achalasia with chronic nausea S/p Heller myotomy with fundoplication in 4270, HTN, uncontrolled Type 2 diabetes  ? ?Presents with worsening dyspnea on exertion and hypoxia.  ?  ?Pt had symptoms of pneumonia in 04/2021 and treated with Keflex by PCP but continued to have persistent symptom of shortness of breath with exertion and nonproductive cough.  She later had a virtual visit for continued symptoms and was treated for allergies with no relief. She then had CT chest/abd/pelvis imaging obtained by oncology for lymphoma surveillance on 07/14/21 had incidental finding bilateral extensive multifocal ground glass opacity with interstitial thickening. 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 Referral was also made to pulmonary with appointment later this week.  ?  ?She had repeat CT chest without contrast on 4/4 by GI to assess for a planned heller myotomy surgery for her achalasia and showed worsening peribronchovascular groundglass with associated bronchiectasis, minimal consolidation and scattered volume loss, finding which may be due to an atypical or viral pneumonia.  This was treated again with another course of antibiotics by her PCP.  However she continued to have worsening symptoms and this week also noted intermittent anterior chest pain and upper back pain. Night sweats daily requiring her to change clothes 3x in the night. Has noticed blood when blowing her nose.  Has had drops in her oxygen saturation from 82 to 94% and decided to present to the ED. ?She denies history of tobacco use.  No recent travels or sick contacts. ?  ?In the ED, she was afebrile normotensive and stable on room air.  No leukocytosis with  mild anemia with hemoglobin 11.3.  Sodium 137, K of 3.1, glucose of 237, normal creatinine of 0.9.  Sed rate was elevated at 75, D-dimer mildly elevated 0.66, negative influenza PCR and COVID PCR. ?Pulmonology was consulted by ED physician who has plans to take her for bronchoscopy tomorrow. ? ? ?Subjective: ?4/14 afebrile overnight, continued dyspnea on exertion. ? ? ?Assessment & Plan: ?Covid vaccination; vaccinated 4/4 ?  ?Principal Problem: ?  Acute respiratory failure with hypoxia ([place]) ?Active Problems: ?  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  Dyspnea ?  HTN (hypertension) ?  Type 2 diabetes mellitus ([place]) ?  Hypokalemia ?  Achalasia ?  Dyspnea on exertion ?  Uncontrolled type 2 diabetes mellitus with hyperglycemia ([place]) ? ?Acute respiratory failure with hypoxia (H[facility])/Positive Conversational Dyspnea ?Presented with hypoxia down to 84% with ambulation but stable at rest.  Had symptoms of persistent exertional dyspnea with nonproductive cough since January and has been treated on 3 different courses of antibiotics.  Had CT chest imaging on 07/14/2021 showing extensive bilateral multifocal groundglass opacity with interstitial thickening.  CT chest without contrast on 4/4 showed worsening peribronchovascular groundglass with associated bronchiectasis.  Originally had outpatient pulmonology follow-up this week. ?-Pulmonology consulted and has obtained Quantiferon gold, glomerular basement member antibiotics, aspergillus Ag, Fungitell, strep pneumoniae urine antigen, Legionella pneumo and histoplasm antigen ?-No antibiotics or steroids per pulmonology ?- Flu/COVID PCR negative.  Respiratory viral panel negative. ?-4/11 s/p Bronchoscopy with BAL positive conversational dyspnea ?-4/11 DuoNeb QID ?-4/11 s/p continuous pulse ox ?- 4/11 titrate O2 to maintain SPO2> 92% ?SATURATION QUALIFICATIONS: (This note is used  to comply with regulatory documentation for home oxygen) ?Patient  Saturations on Room Air at Rest = 97% ?Patient Saturations on Room Air while Ambulating = 95% ?Patient Saturations on NA Liters of oxygen while Ambulating = NA% - Pt did not require O2 while ambulating.  ?Please briefly explain why patient needs home oxygen: ?-4/12 patient does not meet requirements for home O2 ?-4/14 DuoNeb QID ?-4/14 Solu-Medrol 60 mg daily ?- 4/14 Chloraseptic spray PRN throat pain ? ?positive STREPTOCOCCUS MITIS/ORALIS pneumonia ?- 12/13 discussed case with Dr. Jule Ser, ID recommended Augmentin 14-day course. ?- 12/13 if patient tolerates Augmentin will DC on 12/14: Per ID not necessary to wait for remainder of infectious markers to return. ? ?New onset Sinus Tachycardia/Essential HTN ?-Losartan 50 mg daily ?- Toprol 50 mg daily ?-4/12 controlled ?  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 Irene Limbo with oncology ?-Completed Bendamustine Rituxan chemotherapy followed by 18 months of Rituxan maintenance with last dose 02/24/2021. No clinical or radiographic evidence of persistent follicular lymphoma as of recent scans 07/15/2020 ?-4/12 discussed case with Dr. Irene Limbo Oncology Dr [facility] and he felt that immunotherapy was too distant in the past to be causing current respiratory symptoms.  ?  ?Achalasia ?-S/p Heller myotomy with fundoplication in 8841 at [facility]. Had plans for repeat of the procedure but this has been postponed due to recent pulmonary symptoms ?-Continue with thickened diet ?  ?Hypokalemia ?-Potassium goal> 4 ?-4/11 potassium IV 60 mEq + Potassium p.o. 40 mEq ? ?Hypomagnesmia ?- Magnesium goal> 2 ?- 4/11 magnesium IV 2 g ? ?DM type II uncontrolled with hyperglycemia ?-4/12 Levemir 5 units nightly ?-4/12 increase placed on sensitive SSI ?  ?  ? ?Mobility Assessment (last 72 hours)   ? ? Mobility Assessment   ? ? [place] Name 08/06/21 0800 08/05/21 0800 08/03/21 1727  ?  ?  ? Does patient have an order for bedrest or is  patient medically unstable No - Continue assessment No - Continue assessment No - Continue assessment    ? What is the highest level of mobility based on the progressive mobility assessment? Level 6 (Walks independently in room and hall) - Balance while walking in room without assist - Complete Level 6 (Walks independently in room and hall) - Balance while walking in room without assist - Complete Level 6 (Walks independently in room and hall) - Balance while walking in room without assist - Complete    ? ?  ?  ? ?  ? ? ?DVT prophylaxis: Lovenox ?Code Status: Full ?Family Communication:  ?Status is: Inpatient ? ? ? ?Dispo: The patient is from: Home ?             Anticipated d/c is to: Home ?             Anticipated d/c date is: 1 day ?             Patient currently is not medically stable to d/c. ? ? ? ? ? ?Consultants:  ?PCCM ? ? ?Procedures/Significant Events:  ?4/11 Bronchoscopy with BAL ? ? ? ?I have personally reviewed and interpreted all radiology studies and my findings are as above. ? ?VENTILATOR SETTINGS: ?Room air 4/14 ?SPO2 94% ? ? ?Cultures ?4/10 Respiratory Virus Panel Negative ?4/10 SARS coronavirus negative ?4/11 BAL positive STREPTOCOCCUS MITIS/ORALIS  ? ? ? ? ?Antimicrobials: ?Anti-infectives (From admission, onward)  ? ? Start     Dose/Rate Route Frequency Ordered Stop  ?  08/05/21 1230  cefTRIAXone (ROCEPHIN) 2 g in sodium chloride 0.9 % 100 mL IVPB       ? 2 g ?200 mL/hr over 30 Minutes Intravenous Every 24 hours 08/05/21 1138    ? 08/05/21 1115  amoxicillin-clavulanate (AUGMENTIN) 875-125 MG per tablet 1 tablet  Status:  Discontinued       ? 1 tablet Oral Every 12 hours 08/05/21 1023 08/05/21 1044  ? ?  ?  ? ? ?Devices ?  ? ?LINES / TUBES:  ? ? ? ? ?Continuous Infusions: ? cefTRIAXone (ROCEPHIN)  IV 2 g (08/06/21 1212)  ? ? ? ? ?Objective: ?Vitals:  ? 08/06/21 0325 08/06/21 0743 08/06/21 0807 08/06/21 1157  ?BP: 125/75 126/80  126/76  ?Pulse: 85 (!) 102 96 (!) 102  ?Resp: _0 ?Temp: 97.7  ?F (36.5 ?C) 98.2 ?F (36.8 ?C)  98.6 ?F (37 ?C)  ?TempSrc: Oral Oral  Oral  ?SpO2: 93% 94%  94%  ? ?No intake or output data in the 24 hours ending 08/06/21 1444 ? ?There were no vitals filed for this visit. ? ?[place]

## 2021-08-06 NOTE — Care Management Important Message (Signed)
Important Message ? ?Patient Details  ?Name: Meghan Alexander ?MRN: [id number] ?Date of Birth: 11-May-1955 ? ? ?Medicare Important Message Given:  Yes ? ? ? ? ?Chandani Rogowski ?08/06/2021, 2:28 PM ?

## 2021-08-07 DIAGNOSIS — R0609 Other forms of dyspnea: Secondary | ICD-10-CM | POA: Diagnosis not present

## 2021-08-07 DIAGNOSIS — J9601 Acute respiratory failure with hypoxia: Secondary | ICD-10-CM | POA: Diagnosis not present

## 2021-08-07 DIAGNOSIS — K220 Achalasia of cardia: Secondary | ICD-10-CM | POA: Diagnosis not present

## 2021-08-07 DIAGNOSIS — I10 Essential (primary) hypertension: Secondary | ICD-10-CM | POA: Diagnosis present

## 2021-08-07 DIAGNOSIS — C8218 Follicular lymphoma grade II, lymph nodes of multiple sites: Secondary | ICD-10-CM | POA: Diagnosis not present

## 2021-08-07 DIAGNOSIS — R000 Tachycardia, unspecified: Secondary | ICD-10-CM | POA: Diagnosis present

## 2021-08-07 LAB — CULTURE, BLOOD (ROUTINE X 2)
Culture: NO GROWTH
Culture: NO GROWTH
Special Requests: ADEQUATE

## 2021-08-07 LAB — CBC WITH DIFFERENTIAL/PLATELET
Abs Immature Granulocytes: 0.04 10*3/uL (ref 0.00–0.07)
Basophils Absolute: 0 10*3/uL (ref 0.0–0.1)
Basophils Relative: 0 %
Eosinophils Absolute: 0 10*3/uL (ref 0.0–0.5)
Eosinophils Relative: 0 %
HCT: 29.7 % — ABNORMAL LOW (ref 36.0–46.0)
Hemoglobin: 9.5 g/dL — ABNORMAL LOW (ref 12.0–15.0)
Immature Granulocytes: 1 %
Lymphocytes Relative: 6 %
Lymphs Abs: 0.4 10*3/uL — ABNORMAL LOW (ref 0.7–4.0)
MCH: 26.2 pg (ref 26.0–34.0)
MCHC: 32 g/dL (ref 30.0–36.0)
MCV: 82 fL (ref 80.0–100.0)
Monocytes Absolute: 0.4 10*3/uL (ref 0.1–1.0)
Monocytes Relative: 6 %
Neutro Abs: 5.1 10*3/uL (ref 1.7–7.7)
Neutrophils Relative %: 87 %
Platelets: 267 10*3/uL (ref 150–400)
RBC: 3.62 MIL/uL — ABNORMAL LOW (ref 3.87–5.11)
RDW: 14.8 % (ref 11.5–15.5)
WBC: 5.9 10*3/uL (ref 4.0–10.5)
nRBC: 0 % (ref 0.0–0.2)

## 2021-08-07 LAB — COMPREHENSIVE METABOLIC PANEL
ALT: 28 U/L (ref 0–44)
AST: 26 U/L (ref 15–41)
Albumin: 2.7 g/dL — ABNORMAL LOW (ref 3.5–5.0)
Alkaline Phosphatase: 72 U/L (ref 38–126)
Anion gap: 10 (ref 5–15)
BUN: 6 mg/dL — ABNORMAL LOW (ref 8–23)
CO2: 24 mmol/L (ref 22–32)
Calcium: 8.7 mg/dL — ABNORMAL LOW (ref 8.9–10.3)
Chloride: 106 mmol/L (ref 98–111)
Creatinine, Ser: 0.76 mg/dL (ref 0.44–1.00)
GFR, Estimated: >60 mL/min (ref >60)
Glucose, Bld: 218 mg/dL — ABNORMAL HIGH (ref 70–99)
Potassium: 4.2 mmol/L (ref 3.5–5.1)
Sodium: 140 mmol/L (ref 135–145)
Total Bilirubin: 0.2 mg/dL — ABNORMAL LOW (ref 0.3–1.2)
Total Protein: 5.9 g/dL — ABNORMAL LOW (ref 6.5–8.1)

## 2021-08-07 LAB — GLUCOSE, CAPILLARY
Glucose-Capillary: 145 mg/dL — ABNORMAL HIGH (ref 70–99)
Glucose-Capillary: 143 mg/dL — ABNORMAL HIGH (ref 70–99)
Glucose-Capillary: 188 mg/dL — ABNORMAL HIGH (ref 70–99)
Glucose-Capillary: 310 mg/dL — ABNORMAL HIGH (ref 70–99)
Glucose-Capillary: 355 mg/dL — ABNORMAL HIGH (ref 70–99)

## 2021-08-07 LAB — MAGNESIUM: Magnesium: 2 mg/dL (ref 1.7–2.4)

## 2021-08-07 LAB — PHOSPHORUS: Phosphorus: 3.6 mg/dL (ref 2.5–4.6)

## 2021-08-07 MED ORDER — AMOXICILLIN-POT CLAVULANATE 875-125 MG PO TABS
1.0000 | ORAL_TABLET | Freq: Two times a day (BID) | ORAL | Status: DC
  Administered 2021-08-07 – 2021-08-09 (×5): 1 via ORAL
  Filled 2021-08-07 (×5): qty 1

## 2021-08-07 NOTE — Progress Notes (Signed)
?PROGRESS NOTE ? ? ? ?Meghan Alexander  WPV:[id number] DOB: 10-12-1955 DOA: 08/02/2021 ?PCP: [name], MD  ? ? ? ?Brief Narrative:  ?66 y.o. 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 ?Presents with worsening dyspnea on exertion and hypoxia.  ?  ?Pt had symptoms of pneumonia in 04/2021 and treated with Keflex by PCP but continued to have persistent symptom of shortness of breath with exertion and nonproductive cough.  She later had a virtual visit for continued symptoms and was treated for allergies with no relief. She then had CT chest/abd/pelvis imaging obtained by oncology for lymphoma surveillance on 07/14/21 had incidental finding bilateral extensive multifocal ground glass opacity with interstitial thickening. 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 Referral was also made to pulmonary with appointment later this week.  ?  ?She had repeat CT chest without contrast on 4/4 by GI to assess for a planned heller myotomy surgery for her achalasia and showed worsening peribronchovascular groundglass with associated bronchiectasis, minimal consolidation and scattered volume loss, finding which may be due to an atypical or viral pneumonia.  This was treated again with another course of antibiotics by her PCP.  However she continued to have worsening symptoms and this week also noted intermittent anterior chest pain and upper back pain. Night sweats daily requiring her to change clothes 3x in the night. Has noticed blood when blowing her nose.  Has had drops in her oxygen saturation from 82 to 94% and decided to present to the ED. ?She denies history of tobacco use.  No recent travels or sick contacts. ?  ?In the ED, she was afebrile normotensive and stable on room air.  No leukocytosis with  mild anemia with hemoglobin 11.3.  Sodium 137, K of 3.1, glucose of 237, normal creatinine of 0.9.  Sed rate was elevated at 75, D-dimer mildly elevated 0.66, negative influenza PCR and COVID PCR. ?Pulmonology was consulted by ED physician who has plans to take her for bronchoscopy tomorrow. ? ? ?Subjective: ?4/15 elevated temp overnight (37.7 ?C). Dyspnea on exertion. ? ? ?Assessment & Plan: ?Covid vaccination; vaccinated 4/4 ?  ?Principal Problem: ?  Acute respiratory failure with hypoxia ([place]) ?Active Problems: ?  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  Dyspnea ?  HTN (hypertension) ?  Type 2 diabetes mellitus ([place]) ?  Hypokalemia ?  Achalasia ?  Dyspnea on exertion ?  Uncontrolled type 2 diabetes mellitus with hyperglycemia ([place]) ?  Exertional dyspnea ?  Sinus tachycardia ?  Essential hypertension ? ?Acute respiratory failure with hypoxia (HCC)/Positive Conversational Dyspnea ?Presented with hypoxia down to 84% with ambulation but stable at rest.  Had symptoms of persistent exertional dyspnea with nonproductive cough since January and has been treated on 3 different courses of antibiotics.  Had CT chest imaging on 07/14/2021 showing extensive bilateral multifocal groundglass opacity with interstitial thickening.  CT chest without contrast on 4/4 showed worsening peribronchovascular groundglass with associated bronchiectasis.  Originally had outpatient pulmonology follow-up this week. ?-Pulmonology consulted and has obtained Quantiferon gold, glomerular basement member antibiotics, aspergillus Ag, Fungitell, strep pneumoniae urine antigen, Legionella pneumo and histoplasm antigen ?-No antibiotics or steroids per pulmonology ?- Flu/COVID PCR negative.  Respiratory viral panel negative. ?-4/11 s/p Bronchoscopy with BAL positive conversational dyspnea ?-4/11 DuoNeb QID ?-4/11 s/p continuous pulse ox ?-  4/11 titrate O2 to maintain SPO2> 92% ?SATURATION QUALIFICATIONS: (This note is used  to comply with regulatory documentation for home oxygen) ?Patient Saturations on Room Air at Rest = 97% ?Patient Saturations on Room Air while Ambulating = 95% ?Patient Saturations on NA Liters of oxygen while Ambulating = NA% - Pt did not require O2 while ambulating.  ?Please briefly explain why patient needs home oxygen: ?-4/12 patient does not meet requirements for home O2 ?-4/14 DuoNeb QID ?-4/14 Solu-Medrol 60 mg daily ?- 4/14 Chloraseptic spray PRN throat pain ?-4/15 discussed case with ID Dr. Loanne Drilling she is going to schedule one follow-up appointment with PCCM.  After that they will defer to ID. ?-4/15 restart Augmentin x14-day if patient tolerates could discharge in next 24 to 48 hours dependent upon respiratory status. ? ?positive STREPTOCOCCUS MITIS/ORALIS pneumonia ?- 12/13 discussed case with Dr. Jule Ser, ID recommended Augmentin 14-day course. ?- See acute respiratory failure ? ?New onset Sinus Tachycardia/Essential HTN ?-Losartan 50 mg daily ?- Toprol 50 mg daily ?-4/12 controlled ?  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 Irene Limbo with oncology ?-Completed Bendamustine Rituxan chemotherapy followed by 18 months of Rituxan maintenance with last dose 02/24/2021. No clinical or radiographic evidence of persistent follicular lymphoma as of recent scans 07/15/2020 ?-4/12 discussed case with Dr. Irene Limbo Oncology Dr Narda [facility] and he felt that immunotherapy was too distant in the past to be causing current respiratory symptoms.  ?  ?Achalasia ?-S/p Heller myotomy with fundoplication in 5573 at [facility]. Had plans for repeat of the procedure but this has been postponed due to recent pulmonary symptoms ?-Continue with thickened diet ?  ?Hypokalemia ?-Potassium goal> 4 ?-4/11 potassium IV 60 mEq + Potassium p.o. 40 mEq ? ?Hypomagnesmia ?- Magnesium goal> 2 ?- 4/11 magnesium IV 2 g ? ?DM type II uncontrolled with hyperglycemia ?-4/12 Levemir 5 units  nightly ?-4/12 increase placed on sensitive SSI ?  ?  ? ?Mobility Assessment (last 72 hours)   ? ? Mobility Assessment   ? ? Row Name 08/07/21 0824 08/06/21 0800 08/05/21 0800  ?  ?  ? Does patient have an order for bedrest or is patient medically unstable No - Continue assessment No - Continue assessment No - Continue assessment    ? What is the highest level of mobility based on the progressive mobility assessment? Level 6 (Walks independently in room and hall) - Balance while walking in room without assist - Complete Level 6 (Walks independently in room and hall) - Balance while walking in room without assist - Complete Level 6 (Walks independently in room and hall) - Balance while walking in room without assist - Complete    ? ?  ?  ? ?  ? ? ?DVT prophylaxis: Lovenox ?Code Status: Full ?Family Communication:  ?Status is: Inpatient ? ? ? ?Dispo: The patient is from: Home ?             Anticipated d/c is to: Home ?             Anticipated d/c date is: > 3 days ?             Patient currently is not medically stable to d/c. ? ? ? ? ? ?Consultants:  ?PCCM ?ID ? ?Procedures/Significant Events:  ?4/11 Bronchoscopy with BAL ? ? ? ?I have personally reviewed and interpreted all radiology studies and my findings are as above. ? ?VENTILATOR SETTINGS: ?Room air 4/15 ?SPO2 98% ? ? ?  Cultures ?4/10 Respiratory Virus Panel Negative ?4/10 SARS coronavirus negative ?4/11 BAL positive STREPTOCOCCUS MITIS/ORALIS  ? ? ? ? ?Antimicrobials: ?Anti-infectives (From admission, onward)  ? ? Start     Dose/Rate Route Frequency Ordered Stop  ? 08/07/21 1315  amoxicillin-clavulanate (AUGMENTIN) 875-125 MG per tablet 1 tablet       ? 1 tablet Oral Every 12 hours 08/07/21 1215    ? 08/05/21 1230  cefTRIAXone (ROCEPHIN) 2 g in sodium chloride 0.9 % 100 mL IVPB  Status:  Discontinued       ? 2 g ?200 mL/hr over 30 Minutes Intravenous Every 24 hours 08/05/21 1138 08/07/21 1215  ? 08/05/21 1115  amoxicillin-clavulanate (AUGMENTIN) 875-125 MG per  tablet 1 tablet  Status:  Discontinued       ? 1 tablet Oral Every 12 hours 08/05/21 1023 08/05/21 1044  ? ?  ?  ? ? ?Devices ?  ? ?LINES / TUBES:  ? ? ? ? ?Continuous Infusions: ? ? ? ? ? ?Objective: ?Vit

## 2021-08-08 DIAGNOSIS — R0609 Other forms of dyspnea: Secondary | ICD-10-CM | POA: Diagnosis not present

## 2021-08-08 DIAGNOSIS — C8218 Follicular lymphoma grade II, lymph nodes of multiple sites: Secondary | ICD-10-CM | POA: Diagnosis not present

## 2021-08-08 DIAGNOSIS — J9601 Acute respiratory failure with hypoxia: Secondary | ICD-10-CM | POA: Diagnosis not present

## 2021-08-08 DIAGNOSIS — K220 Achalasia of cardia: Secondary | ICD-10-CM | POA: Diagnosis not present

## 2021-08-08 LAB — COMPREHENSIVE METABOLIC PANEL
ALT: 30 U/L (ref 0–44)
AST: 27 U/L (ref 15–41)
Albumin: 2.8 g/dL — ABNORMAL LOW (ref 3.5–5.0)
Alkaline Phosphatase: 81 U/L (ref 38–126)
Anion gap: 10 (ref 5–15)
BUN: 10 mg/dL (ref 8–23)
CO2: 28 mmol/L (ref 22–32)
Calcium: 9 mg/dL (ref 8.9–10.3)
Chloride: 99 mmol/L (ref 98–111)
Creatinine, Ser: 0.95 mg/dL (ref 0.44–1.00)
GFR, Estimated: >60 mL/min (ref >60)
Glucose, Bld: 289 mg/dL — ABNORMAL HIGH (ref 70–99)
Potassium: 4.1 mmol/L (ref 3.5–5.1)
Sodium: 137 mmol/L (ref 135–145)
Total Bilirubin: 0.2 mg/dL — ABNORMAL LOW (ref 0.3–1.2)
Total Protein: 6.4 g/dL — ABNORMAL LOW (ref 6.5–8.1)

## 2021-08-08 LAB — CBC WITH DIFFERENTIAL/PLATELET
Abs Immature Granulocytes: 0.08 10*3/uL — ABNORMAL HIGH (ref 0.00–0.07)
Basophils Absolute: 0 10*3/uL (ref 0.0–0.1)
Basophils Relative: 0 %
Eosinophils Absolute: 0 10*3/uL (ref 0.0–0.5)
Eosinophils Relative: 0 %
HCT: 32.4 % — ABNORMAL LOW (ref 36.0–46.0)
Hemoglobin: 10.2 g/dL — ABNORMAL LOW (ref 12.0–15.0)
Immature Granulocytes: 1 %
Lymphocytes Relative: 4 %
Lymphs Abs: 0.3 10*3/uL — ABNORMAL LOW (ref 0.7–4.0)
MCH: 26.2 pg (ref 26.0–34.0)
MCHC: 31.5 g/dL (ref 30.0–36.0)
MCV: 83.1 fL (ref 80.0–100.0)
Monocytes Absolute: 0.1 10*3/uL (ref 0.1–1.0)
Monocytes Relative: 1 %
Neutro Abs: 6.5 10*3/uL (ref 1.7–7.7)
Neutrophils Relative %: 94 %
Platelets: 306 10*3/uL (ref 150–400)
RBC: 3.9 MIL/uL (ref 3.87–5.11)
RDW: 15 % (ref 11.5–15.5)
WBC: 6.9 10*3/uL (ref 4.0–10.5)
nRBC: 0 % (ref 0.0–0.2)

## 2021-08-08 LAB — MAGNESIUM: Magnesium: 2.1 mg/dL (ref 1.7–2.4)

## 2021-08-08 LAB — LIPID PANEL
Cholesterol: 178 mg/dL (ref 0–200)
HDL: 40 mg/dL — ABNORMAL LOW (ref >40)
LDL Cholesterol: 113 mg/dL — ABNORMAL HIGH (ref 0–99)
Total CHOL/HDL Ratio: 4.5 RATIO
Triglycerides: 126 mg/dL (ref <150)
VLDL: 25 mg/dL (ref 0–40)

## 2021-08-08 LAB — GLUCOSE, CAPILLARY
Glucose-Capillary: 314 mg/dL — ABNORMAL HIGH (ref 70–99)
Glucose-Capillary: 229 mg/dL — ABNORMAL HIGH (ref 70–99)
Glucose-Capillary: 231 mg/dL — ABNORMAL HIGH (ref 70–99)
Glucose-Capillary: 165 mg/dL — ABNORMAL HIGH (ref 70–99)

## 2021-08-08 LAB — PHOSPHORUS: Phosphorus: 3.1 mg/dL (ref 2.5–4.6)

## 2021-08-08 LAB — HEMOGLOBIN A1C
Hgb A1c MFr Bld: 7.5 % — ABNORMAL HIGH (ref 4.8–5.6)
Mean Plasma Glucose: 168.55 mg/dL

## 2021-08-08 MED ORDER — PREDNISONE 20 MG PO TABS
40.0000 mg | ORAL_TABLET | Freq: Every day | ORAL | Status: DC
  Administered 2021-08-09: 40 mg via ORAL
  Filled 2021-08-08: qty 2

## 2021-08-08 MED ORDER — INSULIN DETEMIR 100 UNIT/ML ~~LOC~~ SOLN
12.0000 [IU] | Freq: Every day | SUBCUTANEOUS | Status: DC
  Administered 2021-08-09: 12 [IU] via SUBCUTANEOUS
  Filled 2021-08-08 (×2): qty 0.12

## 2021-08-08 NOTE — Progress Notes (Signed)
?PROGRESS NOTE ? ? ? ?Meghan Alexander  DVV:[id number] DOB: [birth year] DOA: 08/02/2021 ?PCP: [name], MD  ? ? ? ?Brief Narrative:  ?66 y.o. 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 ?Presents with worsening dyspnea on exertion and hypoxia.  ?  ?Pt had symptoms of pneumonia in 04/2021 and treated with Keflex by PCP but continued to have persistent symptom of shortness of breath with exertion and nonproductive cough.  She later had a virtual visit for continued symptoms and was treated for allergies with no relief. She then had CT chest/abd/pelvis imaging obtained by oncology for lymphoma surveillance on 07/14/21 had incidental finding bilateral extensive multifocal ground glass opacity with interstitial thickening. 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 Referral was also made to pulmonary with appointment later this week.  ?  ?She had repeat CT chest without contrast on 4/4 by GI to assess for a planned heller myotomy surgery for her achalasia and showed worsening peribronchovascular groundglass with associated bronchiectasis, minimal consolidation and scattered volume loss, finding which may be due to an atypical or viral pneumonia.  This was treated again with another course of antibiotics by her PCP.  However she continued to have worsening symptoms and this week also noted intermittent anterior chest pain and upper back pain. Night sweats daily requiring her to change clothes 3x in the night. Has noticed blood when blowing her nose.  Has had drops in her oxygen saturation from 82 to 94% and decided to present to the ED. ?She denies history of tobacco use.  No recent travels or sick contacts. ?  ?In the ED, she was afebrile normotensive and stable on room air.  No leukocytosis with  mild anemia with hemoglobin 11.3.  Sodium 137, K of 3.1, glucose of 237, normal creatinine of 0.9.  Sed rate was elevated at 75, D-dimer mildly elevated 0.66, negative influenza PCR and COVID PCR. ?Pulmonology was consulted by ED physician who has plans to take her for bronchoscopy tomorrow. ? ? ?Subjective: ?4/16 afebrile overnight (mildly elevated temp 37.4 C).  A/O x4, patient feels significantly improved. ? ? ?Assessment & Plan: ?Covid vaccination; vaccinated 4/4 ?  ?Principal Problem: ?  Acute respiratory failure with hypoxia ([place]) ?Active Problems: ?  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  Dyspnea ?  HTN (hypertension) ?  Type 2 diabetes mellitus ([place]) ?  Hypokalemia ?  Achalasia ?  Dyspnea on exertion ?  Uncontrolled type 2 diabetes mellitus with hyperglycemia ([place]) ?  Exertional dyspnea ?  Sinus tachycardia ?  Essential hypertension ? ?Acute respiratory failure with hypoxia (HCC)/Positive Conversational Dyspnea ?Presented with hypoxia down to 84% with ambulation but stable at rest.  Had symptoms of persistent exertional dyspnea with nonproductive cough since January and has been treated on 3 different courses of antibiotics.  Had CT chest imaging on 07/14/2021 showing extensive bilateral multifocal groundglass opacity with interstitial thickening.  CT chest without contrast on 4/4 showed worsening peribronchovascular groundglass with associated bronchiectasis.  Originally had outpatient pulmonology follow-up this week. ?-Pulmonology consulted and has obtained Quantiferon gold, glomerular basement member antibiotics, aspergillus Ag, Fungitell, strep pneumoniae urine antigen, Legionella pneumo and histoplasm antigen ?-No antibiotics or steroids per pulmonology ?- Flu/COVID PCR negative.  Respiratory viral panel negative. ?-4/11 s/p Bronchoscopy with BAL positive conversational dyspnea ?-4/11 DuoNeb  QID ?-4/11 s/p continuous pulse ox ?- 4/11 titrate O2 to maintain SPO2>  92% ?SATURATION QUALIFICATIONS: (This note is used to comply with regulatory documentation for home oxygen) ?Patient Saturations on Room Air at Rest = 97% ?Patient Saturations on Room Air while Ambulating = 95% ?Patient Saturations on NA Liters of oxygen while Ambulating = NA% - Pt did not require O2 while ambulating.  ?Please briefly explain why patient needs home oxygen: ?-4/12 patient does not meet requirements for home O2 ?-4/14 DuoNeb QID ?- 4/14 Chloraseptic spray PRN throat pain ?-4/15 discussed case with ID Dr. Loanne Drilling she is going to schedule one follow-up appointment with PCCM.  After that they will defer to ID. ?-4/15 restart Augmentin x14-day if patient tolerates could discharge in next 24 to 48 hours dependent upon respiratory status. ?-4/16 DC Solu-Medrol---> Prednisone 40 mg daily ? ?positive STREPTOCOCCUS MITIS/ORALIS pneumonia ?- 12/13 discussed case with Dr. Jule Ser, ID recommended Augmentin 14-day course. ?- See acute respiratory failure ? ?New onset Sinus Tachycardia/Essential HTN ?-Losartan 50 mg daily ?- Toprol 50 mg daily ?-4/12 controlled ?  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 Irene Limbo with oncology ?-Completed Bendamustine Rituxan chemotherapy followed by 18 months of Rituxan maintenance with last dose 02/24/2021. No clinical or radiographic evidence of persistent follicular lymphoma as of recent scans 07/15/2020 ?-4/12 discussed case with Dr. Irene Limbo Oncology Dr Narda [facility] and he felt that immunotherapy was too distant in the past to be causing current respiratory symptoms.  ?  ?Achalasia ?-S/p Heller myotomy with fundoplication in 2952 at [facility]. Had plans for repeat of the procedure but this has been postponed due to recent pulmonary symptoms ?-Continue with thickened diet ?  ?Hypokalemia ?-Potassium goal> 4 ?-4/11 potassium IV 60 mEq + Potassium p.o. 40 mEq ? ?Hypomagnesmia ?- Magnesium goal> 2 ?- 4/11 magnesium IV 2  g ? ?DM type II uncontrolled with hyperglycemia ?- 4/16 increase Levemir 12 units nightly ?-4/12 increase placed on sensitive SSI ?  ?  ? ?Mobility Assessment (last 72 hours)   ? ? Mobility Assessment   ? ? Row Name 08/08/21 8413 08/07/21 1910 08/07/21 0824 08/06/21 0800  ?  ? Does patient have an order for bedrest or is patient medically unstable No - Continue assessment No - Continue assessment No - Continue assessment No - Continue assessment   ? What is the highest level of mobility based on the progressive mobility assessment? Level 6 (Walks independently in room and hall) - Balance while walking in room without assist - Complete Level 6 (Walks independently in room and hall) - Balance while walking in room without assist - Complete Level 6 (Walks independently in room and hall) - Balance while walking in room without assist - Complete Level 6 (Walks independently in room and hall) - Balance while walking in room without assist - Complete   ? ?  ?  ? ?  ? ? ?DVT prophylaxis: Lovenox ?Code Status: Full ?Family Communication:  ?Status is: Inpatient ? ? ? ?Dispo: The patient is from: Home ?             Anticipated d/c is to: Home ?             Anticipated d/c date is: > 3 days ?             Patient currently is not medically stable to d/c. ? ? ? ? ? ?Consultants:  ?PCCM ?ID ? ?Procedures/Significant Events:  ?  4/11 Bronchoscopy with BAL ? ? ? ?I have personally reviewed and interpreted all radiology studies and my findings are as above. ? ?VENTILATOR SETTINGS: ?Room air 4/15 ?SPO2 98% ? ? ?Cultures ?4/10 Respiratory Virus Panel Negative ?4/10 SARS coronavirus negative ?4/11 BAL positive STREPTOCOCCUS MITIS/ORALIS  ? ? ? ? ?Antimicrobials: ?Anti-infectives (From admission, onward)  ? ? Start     Dose/Rate Route Frequency Ordered Stop  ? 08/07/21 1315  amoxicillin-clavulanate (AUGMENTIN) 875-125 MG per tablet 1 tablet       ? 1 tablet Oral Every 12 hours 08/07/21 1215    ? 08/05/21 1230  cefTRIAXone (ROCEPHIN) 2 g in  sodium chloride 0.9 % 100 mL IVPB  Status:  Discontinued       ? 2 g ?200 mL/hr over 30 Minutes Intravenous Every 24 hours 08/05/21 1138 08/07/21 1215  ? 08/05/21 1115  amoxicillin-clavulanate (AUGMENTIN) 875-125 MG

## 2021-08-09 ENCOUNTER — Encounter: Payer: Self-pay | Admitting: Hematology

## 2021-08-09 ENCOUNTER — Other Ambulatory Visit (HOSPITAL_COMMUNITY): Payer: Self-pay

## 2021-08-09 DIAGNOSIS — K220 Achalasia of cardia: Secondary | ICD-10-CM | POA: Diagnosis not present

## 2021-08-09 DIAGNOSIS — R0600 Dyspnea, unspecified: Secondary | ICD-10-CM

## 2021-08-09 DIAGNOSIS — R0609 Other forms of dyspnea: Secondary | ICD-10-CM | POA: Diagnosis not present

## 2021-08-09 DIAGNOSIS — J9601 Acute respiratory failure with hypoxia: Secondary | ICD-10-CM | POA: Diagnosis not present

## 2021-08-09 LAB — CBC WITH DIFFERENTIAL/PLATELET
Abs Immature Granulocytes: 0.13 10*3/uL — ABNORMAL HIGH (ref 0.00–0.07)
Basophils Absolute: 0 10*3/uL (ref 0.0–0.1)
Basophils Relative: 0 %
Eosinophils Absolute: 0 10*3/uL (ref 0.0–0.5)
Eosinophils Relative: 0 %
HCT: 30.5 % — ABNORMAL LOW (ref 36.0–46.0)
Hemoglobin: 9.5 g/dL — ABNORMAL LOW (ref 12.0–15.0)
Immature Granulocytes: 2 %
Lymphocytes Relative: 3 %
Lymphs Abs: 0.2 10*3/uL — ABNORMAL LOW (ref 0.7–4.0)
MCH: 26.2 pg (ref 26.0–34.0)
MCHC: 31.1 g/dL (ref 30.0–36.0)
MCV: 84.3 fL (ref 80.0–100.0)
Monocytes Absolute: 0.8 10*3/uL (ref 0.1–1.0)
Monocytes Relative: 10 %
Neutro Abs: 7.5 10*3/uL (ref 1.7–7.7)
Neutrophils Relative %: 85 %
Platelets: 325 10*3/uL (ref 150–400)
RBC: 3.62 MIL/uL — ABNORMAL LOW (ref 3.87–5.11)
RDW: 15.3 % (ref 11.5–15.5)
WBC: 8.7 10*3/uL (ref 4.0–10.5)
nRBC: 0 % (ref 0.0–0.2)

## 2021-08-09 LAB — MAGNESIUM: Magnesium: 1.9 mg/dL (ref 1.7–2.4)

## 2021-08-09 LAB — PHOSPHORUS: Phosphorus: 3.9 mg/dL (ref 2.5–4.6)

## 2021-08-09 LAB — GLUCOSE, CAPILLARY
Glucose-Capillary: 116 mg/dL — ABNORMAL HIGH (ref 70–99)
Glucose-Capillary: 254 mg/dL — ABNORMAL HIGH (ref 70–99)

## 2021-08-09 MED ORDER — AMOXICILLIN-POT CLAVULANATE 875-125 MG PO TABS
1.0000 | ORAL_TABLET | Freq: Two times a day (BID) | ORAL | 0 refills | Status: DC
  Filled 2021-08-09: qty 28, 14d supply, fill #0

## 2021-08-09 MED ORDER — PREDNISONE 20 MG PO TABS
40.0000 mg | ORAL_TABLET | Freq: Every day | ORAL | 0 refills | Status: DC
  Filled 2021-08-09: qty 10, 5d supply, fill #0

## 2021-08-09 MED ORDER — IPRATROPIUM-ALBUTEROL 20-100 MCG/ACT IN AERS
1.0000 | INHALATION_SPRAY | Freq: Four times a day (QID) | RESPIRATORY_TRACT | 0 refills | Status: DC | PRN
  Filled 2021-08-09: qty 4, 30d supply, fill #0

## 2021-08-09 MED ORDER — ACCU-CHEK GUIDE VI STRP
ORAL_STRIP | 0 refills | Status: AC
  Filled 2021-08-09: qty 100, 25d supply, fill #0

## 2021-08-09 MED ORDER — ACCU-CHEK SOFTCLIX LANCETS MISC
0 refills | Status: DC
Start: 2021-08-09 — End: 2023-07-14
  Filled 2021-08-09: qty 100, 25d supply, fill #0

## 2021-08-09 MED ORDER — INSULIN DETEMIR 100 UNIT/ML FLEXPEN
12.0000 [IU] | PEN_INJECTOR | Freq: Every day | SUBCUTANEOUS | 0 refills | Status: DC
  Filled 2021-08-09: qty 3, 25d supply, fill #0

## 2021-08-09 MED ORDER — BLOOD GLUCOSE MONITOR SYSTEM W/DEVICE KIT
PACK | 0 refills | Status: DC
  Filled 2021-08-09: qty 1, 30d supply, fill #0

## 2021-08-09 MED ORDER — INSULIN STARTER KIT- PEN NEEDLES (ENGLISH)
1.0000 | Freq: Once | 0 refills | Status: AC
  Filled 2021-08-09: qty 1, 1d supply, fill #0

## 2021-08-09 MED ORDER — PHENOL 1.4 % MT LIQD
1.0000 | OROMUCOSAL | 0 refills | Status: DC | PRN
  Filled 2021-08-09: qty 20, fill #0

## 2021-08-09 MED ORDER — INSULIN PEN NEEDLE 32G X 4 MM MISC
0 refills | Status: DC
  Filled 2021-08-09: qty 100, 25d supply, fill #0

## 2021-08-09 MED ORDER — GUAIFENESIN 100 MG/5ML PO LIQD
5.0000 mL | ORAL | 0 refills | Status: DC | PRN
Start: 2021-08-09 — End: 2021-09-08
  Filled 2021-08-09: qty 236, 8d supply, fill #0

## 2021-08-09 NOTE — Progress Notes (Signed)
Discharge instructions reviewed with pt and family member at bedside.  ?Copy of instructions given to pt, pt has meds in room delivered by [facility].  ? ?Pt has a list of questions, this RN able to answer a few of them. Pt/family also asking to see the doctor, pt states she has not seen the doctor this am.  Pt's primary nurse, Junie Panning notified and she will reach out to the doctor.   ?           ?

## 2021-08-09 NOTE — Discharge Summary (Addendum)
Physician Discharge Summary  ?Meghan Alexander KVQ:[id number] DOB: [birth year] DOA: 08/02/2021 ? ?PCP: [name], MD ? ?Admit date: 08/02/2021 ?Discharge date: 08/09/2021 ? ?Time spent: 35 minutes ? ?Recommendations for Outpatient Follow-up: ? ? ? Acute respiratory failure with hypoxia (HCC)/Positive Conversational Dyspnea ?Presented with hypoxia down to 84% with ambulation but stable at rest.  Had symptoms of persistent exertional dyspnea with nonproductive cough since January and has been treated on 3 different courses of antibiotics.  Had CT chest imaging on 07/14/2021 showing extensive bilateral multifocal groundglass opacity with interstitial thickening.  CT chest without contrast on 4/4 showed worsening peribronchovascular groundglass with associated bronchiectasis.  Originally had outpatient pulmonology follow-up this week. ?-Pulmonology consulted and has obtained Quantiferon gold, glomerular basement member antibiotics, aspergillus Ag, Fungitell, strep pneumoniae urine antigen, Legionella pneumo and histoplasm antigen ?-No antibiotics or steroids per pulmonology ?- Flu/COVID PCR negative.  Respiratory viral panel negative. ?-4/11 s/p Bronchoscopy with BAL positive conversational dyspnea ?-4/11 DuoNeb QID ?-4/11 s/p continuous pulse ox ?- 4/11 titrate O2 to maintain SPO2> 92% ?SATURATION QUALIFICATIONS: (This note is used to comply with regulatory documentation for home oxygen) ?Patient Saturations on Room Air at Rest = 97% ?Patient Saturations on Room Air while Ambulating = 95% ?Patient Saturations on NA Liters of oxygen while Ambulating = NA% - Pt did not require O2 while ambulating.  ?Please briefly explain why patient needs home oxygen: ?-4/12 patient does not meet requirements for home O2 ?-4/14 DuoNeb QID ?- 4/14 Chloraseptic spray PRN throat pain ?-4/15 discussed case with ID Dr. Loanne Drilling she is going to schedule one follow-up appointment with PCCM.  After that they will defer to ID. ?-4/15 restart  Augmentin x14-day if patient tolerates could discharge in next 24 to 48 hours dependent upon respiratory status. ?-4/16 DC Solu-Medrol---> Prednisone 40 mg daily x 5 days ?- Schedule follow-up with Dr. Baltazar Apo PCCM ASAP within 1 month.  Review labs. ?  ?positive STREPTOCOCCUS MITIS/ORALIS pneumonia ?- 12/13 discussed case with Dr. Jule Ser, ID recommended Augmentin 14-day course. ?- See acute respiratory failure ?-Follow-up with Dr. Jule Ser and ID on 4 May@  0845 ?  ?New onset Sinus Tachycardia/Essential HTN ?-Losartan 50 mg daily ?- Toprol 50 mg daily ?-4/12 controlled ?  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 Irene Limbo with oncology ?-Completed Bendamustine Rituxan chemotherapy followed by 18 months of Rituxan maintenance with last dose 02/24/2021. No clinical or radiographic evidence of persistent follicular lymphoma as of recent scans 07/15/2020 ?-4/12 discussed case with Dr. Irene Limbo Oncology Dr Narda [facility] and he felt that immunotherapy was too distant in the past to be causing current respiratory symptoms.  ?  ?Achalasia ?-S/p Heller myotomy with fundoplication in 6433 at [facility]. Had plans for repeat of the procedure but this has been postponed due to recent pulmonary symptoms ?-Continue with thickened diet ?  ?Hypokalemia ?-Potassium goal> 4 ?-4/11 potassium IV 60 mEq + Potassium p.o. 40 mEq ?  ?Hypomagnesmia ?- Magnesium goal> 2 ?- 4/11 magnesium IV 2 g ? ?DM type II uncontrolled with hyperglycemia ?- 4/16 increase Levemir 12 units nightly ? ? ? ? ?Discharge Diagnoses:  ?Principal Problem: ?  Acute respiratory failure with hypoxia ([place]) ?Active Problems: ?  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  Dyspnea ?  HTN (hypertension) ?  Type 2 diabetes mellitus ([place]) ?  Hypokalemia ?  Achalasia ?  Dyspnea on exertion ?  Uncontrolled type 2 diabetes mellitus with  hyperglycemia ([place]) ?  Exertional dyspnea ?  Sinus  tachycardia ?  Essential hypertension ? ? ?Discharge Condition: Stable ? ?Diet recommendation: Carb modified ? ?There were no vitals filed for this visit. ? ?History of present illness:  ?66 y.o. WF PMHx Stage III/IV follicular lymphoma completed chemotherapy and immunotherapy, achalasia with chronic nausea S/p Heller myotomy with fundoplication in 5329, HTN, uncontrolled Type 2 diabetes  ?  ?Presents with worsening dyspnea on exertion and hypoxia.  ?  ?Pt had symptoms of pneumonia in 04/2021 and treated with Keflex by PCP but continued to have persistent symptom of shortness of breath with exertion and nonproductive cough.  She later had a virtual visit for continued symptoms and was treated for allergies with no relief. She then had CT chest/abd/pelvis imaging obtained by oncology for lymphoma surveillance on 07/14/21 had incidental finding bilateral extensive multifocal ground glass opacity with interstitial thickening. 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 Referral was also made to pulmonary with appointment later this week.  ?  ?She had repeat CT chest without contrast on 4/4 by GI to assess for a planned heller myotomy surgery for her achalasia and showed worsening peribronchovascular groundglass with associated bronchiectasis, minimal consolidation and scattered volume loss, finding which may be due to an atypical or viral pneumonia.  This was treated again with another course of antibiotics by her PCP.  However she continued to have worsening symptoms and this week also noted intermittent anterior chest pain and upper back pain. Night sweats daily requiring her to change clothes 3x in the night. Has noticed blood when blowing her nose.  Has had drops in her oxygen saturation from 82 to 94% and decided to present to the ED. ?She denies history of tobacco use.  No recent travels or sick contacts. ?  ?In the ED, she was afebrile  normotensive and stable on room air.  No leukocytosis with mild anemia with hemoglobin 11.3.  Sodium 137, K of 3.1, glucose of 237, normal creatinine of 0.9.  Sed rate was elevated at 75, D-dimer mildly elevated 0.66, negative influenza PCR and COVID PCR. ?Pulmonology was consulted by ED physician who has plans to take her for bronchoscopy tomorrow. ? ?Hospital Course:  ?See above ? ?Procedures: ?4/11 Bronchoscopy with BAL ? ?Consultations: ?PCCM ?ID ? ? ?Cultures  ?4/10 Respiratory Virus Panel Negative ?4/10 SARS coronavirus negative ?4/11 BAL positive STREPTOCOCCUS MITIS/ORALIS  ?4/11Coccidioides and Blastomyces labs sent out to [facility] ?4/11 QuantiFERON was negative ?4/11 Aspergillus galactomannan still pending ?4/11 Fungitell was negative ? ? ?Antibiotics ?Anti-infectives (From admission, onward)  ? ? Start     Ordered Stop  ? 08/07/21 1315  amoxicillin-clavulanate (AUGMENTIN) 875-125 MG per tablet 1 tablet       ? 08/07/21 1215    ? 08/05/21 1230  cefTRIAXone (ROCEPHIN) 2 g in sodium chloride 0.9 % 100 mL IVPB  Status:  Discontinued       ? 08/05/21 1138 08/07/21 1215  ? 08/05/21 1115  amoxicillin-clavulanate (AUGMENTIN) 875-125 MG per tablet 1 tablet  Status:  Discontinued       ? 08/05/21 1023 08/05/21 1044  ? ?  ?  ? ? ?Discharge Exam: ?Vitals:  ? 08/08/21 2148 08/09/21 0001 08/09/21 0421 08/09/21 0751  ?BP: 140/78 129/65 124/87 123/72  ?Pulse: (!) 106 91 (!) 106 100  ?Resp: (!) 30 (!) _0 ?Temp: 99.7 ?F (37.6 ?C) 99.2 ?F (37.3 ?C) (!) 100.4 ?F (  38 ?C) 98.6 ?F (37 ?C)  ?TempSrc: Oral Oral Oral Oral  ?SpO2: 92% 94% 96% 96%  ? ? ?General: Positive acute respiratory distress with exertion. ?Eyes: negative scleral hemorrhage, negative anisocoria, negative icterus ?ENT: Negative Runny nose, negative gingival bleeding, ?Neck:  Negative scars, masses, torticollis, lymphadenopathy, JVD ?Lungs: poor air movement bilaterally lower lungs improved from 4/14, positive bibasilar crackles, positive conversational  dyspnea (significantly improved), ? ?Discharge Instructions ? ? ?Allergies as of 08/09/2021   ? ?   Reactions  ? Latex Other (See Comments)  ? Blisters / skin peeling ?Blisters / skin peeling ?BLISTERS RASH   ? Codeine Nau

## 2021-08-11 ENCOUNTER — Ambulatory Visit: Payer: Medicare Other | Admitting: Pulmonary Disease

## 2021-08-11 ENCOUNTER — Encounter: Payer: Self-pay | Admitting: Pulmonary Disease

## 2021-08-11 VITALS — BP 124/76 | HR 92 | Ht 62.0 in | Wt 164.6 lb

## 2021-08-11 DIAGNOSIS — J849 Interstitial pulmonary disease, unspecified: Secondary | ICD-10-CM | POA: Diagnosis not present

## 2021-08-11 DIAGNOSIS — J189 Pneumonia, unspecified organism: Secondary | ICD-10-CM | POA: Diagnosis not present

## 2021-08-11 LAB — MISC LABCORP TEST (SEND OUT)
Labcorp test code: 138002
Labcorp test code: 164798

## 2021-08-11 LAB — VIRUS CULTURE

## 2021-08-11 MED ORDER — PREDNISONE 20 MG PO TABS: 40.0000 mg | ORAL_TABLET | Freq: Every day | ORAL | 1 refills | Status: DC

## 2021-08-11 MED ORDER — SULFAMETHOXAZOLE-TRIMETHOPRIM 800-160 MG PO TABS: 1.0000 | ORAL_TABLET | ORAL | 1 refills | Status: DC

## 2021-08-11 NOTE — Progress Notes (Signed)
? ?Synopsis: Referred in April 2023 for abnormal CT chest findings by Brunetta Genera, MD ? ?Subjective:  ? ?PATIENT ID: Meghan Alexander GENDER: female DOB: 08-02-1955, MRN: [id number] ? ? ?HPI ? ?Chief Complaint  ?Patient presents with  ? Consult  ?  F/U on bronch done on 08/03/21. Has not noticed an improvement in her SOB.   ? ?Meghan Alexander is a 66 year old woman, never smoker with allergies, achalasia, GERD, hypertension, DM II and follicular lymphoma who is referred to pulmonary clinic for abnormal CT scan.  ? ?She was admitted 4/10 to 4/17 for acute respiratory failure with diffuse ground glass opacities noted on her CT Chest scan. She had progressive cough, night sweats, chills and dyspnea. She completed 3 courses of outpatient antibiotics without improvement. She underwent bronchoscopy with BAL of the RML and transbronchial brushings of the RLL on 4/11. BAL cell count was predominantly lymphocytic. Pathology did not indicate malignant cells on brushings. There was report of concern for actinomyces on path report. BAL culture grew strep mitis. Fungtitell and quantiferon gold are negative. ID was consulted. Patient was treated with ceftriaxone and azithromycin and placed on augmentin for 14 day course and treated with high dose IV steroids and discharged on 5 days of prednisone 86m daily. ESR was 75 on 08/02/21 ? ?She reports noticing improvement in her breathing after receiving the IV steroids. She did not require supplemental oxygen at time of discharge.  ? ?Upon chart review, she received her rituximab infusion on 02/25/21 and was seen again in Oncology clinic 05/11/21 where it was noted she had bronchitis and treated for pneumonia since the infusion. There is no chest imaging for review from late 2022 or early 2023.  ? ? ? ?Past Medical History:  ?Diagnosis Date  ? Allergy   ? year around allergies  ? Arthritis   ? fingers, knees, neck, back-osteoarthristis  ? Bronchitis   ? hx of  ? Complication of  anesthesia   ? 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/02/[place]  ? GERD (gastroesophageal reflux disease)   ? hx of reflux-went away with elevating HOB   ? Hypertension   ? Pneumonia   ? hx of   ? PONV (postoperative nausea and vomiting)   ? Type 2 diabetes mellitus ([place] 08/02/2021  ?  ? ?Family History  ?Problem Relation Age of Onset  ? Lung cancer Father   ?     1982 died from it  ? Hernia Mother   ? Breast cancer Maternal Aunt   ?     multiple   ? Breast cancer Maternal Grandmother   ? Breast cancer Paternal Grandmother   ? Breast cancer Cousin   ?     cousins multiple   ? Colon cancer Neg Hx   ? Colon polyps Neg Hx   ? Rectal cancer Neg Hx   ? Stomach cancer Neg Hx   ?  ? ?Social History  ? ?Socioeconomic History  ? Marital status: Married  ?  Spouse name: Not on file  ? Number of children: Not on file  ? Years of education: Not on file  ? Highest education level: Not on file  ?Occupational History  ? Not on file  ?Tobacco Use  ? Smoking status: Never  ? Smokeless tobacco: Never  ?Vaping Use  ? Vaping Use: Never used  ?Substance and Sexual Activity  ? Alcohol use: No  ?  Alcohol/week: 0.0 standard drinks  ? Drug  use: No  ? Sexual activity: Not on file  ?Other Topics Concern  ? Not on file  ?Social History Narrative  ? 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 Never smoker never drinker  ? No drugs  ? Multiple allergies  ? Lives at home with her husband of 8 years since 2008  ? ?Social Determinants of Health  ? ?Financial Resource Strain: Not on file  ?Food Insecurity: Not on file  ?Transportation Needs: Not on file  ?Physical Activity: Not on file  ?Stress: Not on file  ?Social Connections: Not on file  ?Intimate Partner Violence: Not on file  ?  ? ?Allergies  ?Allergen Reactions  ? Latex Other (See Comments)  ?  Blisters / skin peeling ?Blisters / skin peeling ?BLISTERS RASH   ? Codeine Nausea And Vomiting and Other (See Comments)  ?   hallucinations ?hallucinations ?HALLUCINATIONS.   ? Atenolol Cough  ? Other Other (See Comments)  ?  DermaPlast? Used after surgery - caused swelling, itching, and wound broke open  ? Hydrocodone Nausea And Vomiting  ? Oxycodone Nausea And Vomiting  ?  ? ?Outpatient Medications Prior to Visit  ?Medication Sig Dispense Refill  ? Accu-Chek Softclix Lancets lancets Use to test blood sugars up to 4 times daily as directed. 100 each 0  ? acyclovir (ZOVIRAX) 400 MG tablet TAKE 1 TABLET(400 MG) BY MOUTH DAILY (Patient taking differently: Take 400 mg by mouth daily.) 30 tablet 6  ? amoxicillin-clavulanate (AUGMENTIN) 875-125 MG tablet Take 1 tablet by mouth every 12 (twelve) hours. 28 tablet 0  ? Blood Glucose Monitoring Suppl (BLOOD GLUCOSE MONITOR SYSTEM) w/Device KIT Use to test blood sugars up to 4 times daily. 1 kit 0  ? Cholecalciferol (VITAMIN D3 PO) Take 1 tablet by mouth daily.    ? diphenhydrAMINE (BENADRYL) 25 MG tablet Take 25 mg by mouth at bedtime.    ? glucose blood (ACCU-CHEK GUIDE) test strip Use to test blood sugars up to 4 times daily as directed. 100 each 0  ? guaiFENesin (ROBITUSSIN) 100 MG/5ML liquid Take 5 mLs by mouth every 4 (four) hours as needed for cough or to loosen phlegm. 236 mL 0  ? insulin detemir (LEVEMIR) 100 UNIT/ML FlexPen Inject 12 Units into the skin daily. 15 mL 0  ? Insulin Pen Needle 32G X 4 MM MISC Use to inject insulin up to 4 times daily as directed. 100 each 0  ? Ipratropium-Albuterol (COMBIVENT) 20-100 MCG/ACT AERS respimat Inhale 1 puff into the lungs every 6 (six) hours as needed for wheezing. 4 g 0  ? losartan (COZAAR) 50 MG tablet Take 50 mg by mouth daily.    ? Melatonin 10 MG CAPS Take 10 mg by mouth at bedtime.     ? meloxicam (MOBIC) 15 MG tablet Take 15 mg by mouth daily as needed for pain.    ? metFORMIN (GLUCOPHAGE) 500 MG tablet Take 500 mg by mouth daily.    ? metoprolol succinate (TOPROL-XL) 50 MG 24 hr tablet Take 50 mg by mouth daily.    ? Multiple Vitamin  (MULTIVITAMIN) tablet Take 1 tablet by mouth daily.    ? omeprazole (PRILOSEC) 20 MG capsule Take 20 mg by mouth 2 (two) times daily.    ? phenol (CHLORASEPTIC) 1.4 % LIQD Use as directed 1 spray in the mouth or throat as needed for throat irritation / pain. 20 mL 0  ? predniSONE (DELTASONE) 20 MG tablet Take 2 tablets (  40 mg total) by mouth daily with breakfast. 10 tablet 0  ? rosuvastatin (CRESTOR) 10 MG tablet Take 10 mg by mouth daily.    ? venlafaxine (EFFEXOR) 37.5 MG tablet Take 37.5 mg by mouth daily.    ? ?No facility-administered medications prior to visit.  ? ?Review of Systems  ?Constitutional:  Negative for chills, fever, malaise/fatigue and weight loss.  ?HENT:  Negative for congestion, sinus pain and sore throat.   ?Eyes: Negative.   ?Respiratory:  Positive for cough and shortness of breath. Negative for hemoptysis, sputum production and wheezing.   ?Cardiovascular:  Negative for chest pain, palpitations, orthopnea, claudication and leg swelling.  ?Gastrointestinal:  Negative for abdominal pain, heartburn, nausea and vomiting.  ?Genitourinary: Negative.   ?Musculoskeletal:  Negative for joint pain and myalgias.  ?Skin:  Negative for rash.  ?Neurological:  Negative for weakness.  ?Endo/Heme/Allergies: Negative.   ?Psychiatric/Behavioral: Negative.    ? ?Objective:  ? ?Vitals:  ? 08/11/21 1632  ?BP: 124/76  ?Pulse: 92  ?SpO2: 100%  ?Weight: 164 lb 9.6 oz (74.7 kg)  ?Height: _0  (1.575 m)  ? ? ?Physical Exam ?Constitutional:   ?   General: She is not in acute distress. ?   Appearance: She is not ill-appearing.  ?HENT:  ?   Head: Normocephalic and atraumatic.  ?Eyes:  ?   General: No scleral icterus. ?   Conjunctiva/sclera: Conjunctivae normal.  ?   Pupils: Pupils are equal, round, and reactive to light.  ?Cardiovascular:  ?   Rate and Rhythm: Normal rate and regular rhythm.  ?   Pulses: Normal pulses.  ?   Heart sounds: Normal heart sounds. No murmur heard. ?Pulmonary:  ?   Effort: Pulmonary effort is  normal.  ?   Breath sounds: Normal breath sounds. No wheezing, rhonchi or rales.  ?Abdominal:  ?   General: Bowel sounds are normal.  ?   Palpations: Abdomen is soft.  ?Musculoskeletal:  ?   Right lower leg: No edema.  ?

## 2021-08-11 NOTE — Patient Instructions (Addendum)
Continue prednisone '40mg'$  daily ? ?I am concern you have pneumonitis reaction to the rituximab since your symptoms started just over a month after your infusion. ? ?Take bactrim 1 tab Mon- Wed- Fri while on prednisone until further directions apply ? ?Follow up in 4 weeks with pulmonary function tests ? ? ?

## 2021-08-16 ENCOUNTER — Other Ambulatory Visit (HOSPITAL_COMMUNITY): Payer: Self-pay

## 2021-08-17 ENCOUNTER — Other Ambulatory Visit: Payer: Self-pay

## 2021-08-17 ENCOUNTER — Inpatient Hospital Stay: Payer: Medicare Other

## 2021-08-17 ENCOUNTER — Inpatient Hospital Stay: Payer: Medicare Other | Attending: Hematology | Admitting: Hematology

## 2021-08-17 ENCOUNTER — Other Ambulatory Visit: Payer: Medicare Other

## 2021-08-17 VITALS — BP 145/79 | HR 97 | Temp 97.9°F | Resp 20 | Wt 164.5 lb

## 2021-08-17 DIAGNOSIS — I700 Atherosclerosis of aorta: Secondary | ICD-10-CM | POA: Diagnosis not present

## 2021-08-17 DIAGNOSIS — K760 Fatty (change of) liver, not elsewhere classified: Secondary | ICD-10-CM | POA: Insufficient documentation

## 2021-08-17 DIAGNOSIS — Z801 Family history of malignant neoplasm of trachea, bronchus and lung: Secondary | ICD-10-CM | POA: Insufficient documentation

## 2021-08-17 DIAGNOSIS — I10 Essential (primary) hypertension: Secondary | ICD-10-CM | POA: Diagnosis not present

## 2021-08-17 DIAGNOSIS — Z803 Family history of malignant neoplasm of breast: Secondary | ICD-10-CM | POA: Insufficient documentation

## 2021-08-17 DIAGNOSIS — E119 Type 2 diabetes mellitus without complications: Secondary | ICD-10-CM | POA: Diagnosis not present

## 2021-08-17 DIAGNOSIS — Z8379 Family history of other diseases of the digestive system: Secondary | ICD-10-CM | POA: Insufficient documentation

## 2021-08-17 DIAGNOSIS — C8218 Follicular lymphoma grade II, lymph nodes of multiple sites: Secondary | ICD-10-CM

## 2021-08-17 DIAGNOSIS — Z5112 Encounter for antineoplastic immunotherapy: Secondary | ICD-10-CM | POA: Diagnosis not present

## 2021-08-17 DIAGNOSIS — Z79899 Other long term (current) drug therapy: Secondary | ICD-10-CM | POA: Diagnosis not present

## 2021-08-17 LAB — CBC WITH DIFFERENTIAL/PLATELET
Abs Immature Granulocytes: 0.32 10*3/uL — ABNORMAL HIGH (ref 0.00–0.07)
Basophils Absolute: 0 10*3/uL (ref 0.0–0.1)
Basophils Relative: 0 %
Eosinophils Absolute: 0 10*3/uL (ref 0.0–0.5)
Eosinophils Relative: 0 %
HCT: 32.9 % — ABNORMAL LOW (ref 36.0–46.0)
Hemoglobin: 10.2 g/dL — ABNORMAL LOW (ref 12.0–15.0)
Immature Granulocytes: 3 %
Lymphocytes Relative: 3 %
Lymphs Abs: 0.3 10*3/uL — ABNORMAL LOW (ref 0.7–4.0)
MCH: 25.4 pg — ABNORMAL LOW (ref 26.0–34.0)
MCHC: 31 g/dL (ref 30.0–36.0)
MCV: 82 fL (ref 80.0–100.0)
Monocytes Absolute: 0.6 10*3/uL (ref 0.1–1.0)
Monocytes Relative: 6 %
Neutro Abs: 8.7 10*3/uL — ABNORMAL HIGH (ref 1.7–7.7)
Neutrophils Relative %: 88 %
Platelets: 278 10*3/uL (ref 150–400)
RBC: 4.01 MIL/uL (ref 3.87–5.11)
RDW: 15.1 % (ref 11.5–15.5)
WBC: 9.9 10*3/uL (ref 4.0–10.5)
nRBC: 0 % (ref 0.0–0.2)

## 2021-08-17 LAB — CMP (CANCER CENTER ONLY)
ALT: 19 U/L (ref 0–44)
AST: 15 U/L (ref 15–41)
Albumin: 3.5 g/dL (ref 3.5–5.0)
Alkaline Phosphatase: 77 U/L (ref 38–126)
Anion gap: 10 (ref 5–15)
BUN: 9 mg/dL (ref 8–23)
CO2: 29 mmol/L (ref 22–32)
Calcium: 9.1 mg/dL (ref 8.9–10.3)
Chloride: 98 mmol/L (ref 98–111)
Creatinine: 0.76 mg/dL (ref 0.44–1.00)
GFR, Estimated: >60 mL/min (ref >60)
Glucose, Bld: 236 mg/dL — ABNORMAL HIGH (ref 70–99)
Potassium: 3.1 mmol/L — ABNORMAL LOW (ref 3.5–5.1)
Sodium: 137 mmol/L (ref 135–145)
Total Bilirubin: 0.3 mg/dL (ref 0.3–1.2)
Total Protein: 6.4 g/dL — ABNORMAL LOW (ref 6.5–8.1)

## 2021-08-17 NOTE — Progress Notes (Signed)
? ? ?HEMATOLOGY/ONCOLOGY CLINIC NOTE ? ?Date of Service: 08/17/2021 ? ? ?Patient Care Team: ?Burton Apley, MD as PCP - General (Internal Medicine) ? 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 ?. 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 ?  ?04/04/2019 -  Chemotherapy  ? Patient is on Treatment Plan : NON-HODGKINS LYMPHOMA Rituximab D1 / Bendamustine D1,2 q28d  ? ?  ?  ? ? ?HISTORY OF PRESENTING ILLNESS:  ?Please see previous notes for details on initial presentation.  ? ?INTERVAL HISTORY ? ?[name] is a 66 y.o. female here to follow-up for continued evaluation and management on her follicular lymphoma and Patient notes that she had a pneumonia in January 2023 which was treated with antibiotics by her primary care physician.  She thinks that antibiotic was Keflex. ? ?She reports that after her recenthospitalization, steroids and amoxicillin only worked for about 3 days and she now feels that her symptoms have worsened. She notes that she has maintained a consistent cough and been experiencing fatigue and SOB. ? ?She is taking folic acid and not currently on iron supplement.  ? ?We discussed taking a potassium supplement which she was agreeable to.  ? ?She reports issues with her achalasia and difficulty eating. She notes that she feels she can only eat food if it is through a straw or very chewed. She notes she has a little weight because of it. ? ?We discussed that there are no findings consistent with lymphoma progression but she will f/u with pulmonology to continue f/u on lung infiltrates and for further evaluation and management  ? ?No fever, chills, night sweats. ?No signs of aspiration. ?No other new or acute focal symptoms. ? ?We discussed labs done today 08/17/2021. ?CBC  shows decreased Hgb of 10.2 g/dL and HCT of 53.7%. Elevated ANC of 8700. Decreased ALC of 0.3 K/uL. Elevated AIG of 0.32 K/uL. ?CMP WNL except potassium decreased at 3.1, elevated bld glucose of 236 mg/dL.  ? ?We discussed recent CT scan done 07/28/2021 which revealed "1. Worsening peribronchovascular ground-glass with associated ?bronchiectasis, minimal consolidation and scattered volume loss, ?findings which may be due to an atypical or viral pneumonia. ?Organizing pneumonia is another consideration. Drug related toxicity ?is considered unlikely given last dose November 2022. Consider ?consultation with pulmonary medicine. ?2. Patulous fluid-filled esophagus, consistent with known achalasia. ?3. Hepatic steatosis. ?4. Aortic atherosclerosis (ICD10-I70.0). Coronary artery ?calcification." ? ?We discussed Blastomyces dermitidis Abs  lab done 08/05/2021 which is negative. ? ?MEDICAL HISTORY:  ?Past Medical History:  ?Diagnosis Date  ? Allergy   ? year around allergies  ? Arthritis   ? fingers, knees, neck, back-osteoarthristis  ? Bronchitis   ? hx of  ? Complication of anesthesia   ? 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 GERD (gastroesophageal reflux disease)   ? hx of reflux-went away with elevating HOB   ? Hypertension   ? Pneumonia   ? hx of   ? PONV (postoperative nausea and vomiting)   ? Type 2 diabetes mellitus (HCC) 08/02/2021  ? ? ?SURGICAL HISTORY: ?Past Surgical History:  ?Procedure Laterality Date  ? ABDOMINAL HYSTERECTOMY    ? ADENOIDECTOMY    ? BRONCHIAL BRUSHINGS  08/03/2021  ? Procedure: BRONCHIAL BRUSHINGS;  Surgeon: Leslye Peer, MD;  Location: [facility] ENDOSCOPY;  Service: Cardiopulmonary;;  ? BRONCHIAL WASHINGS  08/03/2021  ? Procedure: BRONCHIAL WASHINGS;  Surgeon: Collene Gobble, MD;  Location: [facility] ENDOSCOPY;  Service: Cardiopulmonary;;  ? cosmetic surgeries    ? ESOPHAGEAL MANOMETRY N/A 12/02/2015  ? Procedure: ESOPHAGEAL MANOMETRY (EM);  Surgeon: [name], MD;  Location: [facility] ENDOSCOPY;  Service: Endoscopy;  Laterality: N/A;  ? ESOPHAGOGASTRODUODENOSCOPY N/A 12/02/2015  ? Procedure: ESOPHAGOGASTRODUODENOSCOPY (EGD);  Surgeon: [name], MD;  Location: [facility];  Service: Endoscopy;  Laterality: N/A;  ? lymph node removal left leg    ? cat scratch surgery   ? THROAT SURGERY    ? sleep apnea surgery   ? TONSILLECTOMY    ? VIDEO BRONCHOSCOPY  08/03/2021  ? Procedure: VIDEO BRONCHOSCOPY WITH FLUORO;  Surgeon: Collene Gobble, MD;  Location: [facility] ENDOSCOPY;  Service: Cardiopulmonary;;  ? ? ?SOCIAL HISTORY: ?Social History  ? ?Socioeconomic History  ? Marital status: Married  ?  Spouse name: Not on file  ? Number of children: Not on file  ? Years of education: Not on file  ? Highest education level: Not on file  ?Occupational History  ? Not on file  ?Tobacco Use  ? Smoking status: Never  ? Smokeless tobacco: Never  ?Vaping Use  ? Vaping Use: Never used  ?Substance and Sexual Activity  ? Alcohol use: No  ?  Alcohol/week: 0.0 standard drinks  ? Drug use: No  ? Sexual activity: Not on file  ?Other Topics Concern  ? Not on file  ?Social History Narrative  ? 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 Never smoker never drinker  ? No drugs  ? Multiple allergies  ? Lives at home with her husband of 8 years since 2008  ? ?Social Determinants of Health  ? ?Financial Resource Strain: Not on file  ?Food Insecurity: Not on file  ?Transportation Needs: Not on file  ?Physical Activity: Not on file  ?Stress: Not on file  ?Social Connections: Not on file  ?Intimate Partner Violence: Not on file  ? ? ?FAMILY HISTORY: ?Family History  ?Problem Relation Age of Onset  ? Lung cancer Father   ?     1982 died from it  ? Hernia Mother   ? Breast cancer Maternal Aunt   ?     multiple   ? Breast cancer Maternal Grandmother   ? Breast cancer Paternal Grandmother   ? Breast cancer Cousin   ?     cousins multiple   ? Colon cancer Neg Hx   ? Colon polyps Neg  Hx   ? Rectal cancer Neg Hx   ? Stomach cancer Neg Hx   ? ? ?ALLERGIES:  is allergic to latex, codeine, atenolol, other, hydrocodone, and oxycodone. ? ?MEDICATIONS:  ?Current Outpatient Medications  ?Medication Sig Dispense Refill  ? acyclovir (ZOVIRAX) 400 MG tablet TAKE 1 TABLET(400 MG) BY MOUTH DAILY (Patient taking differently: Take 400 mg by mouth daily.) 30 tablet 6  ? amoxicillin-clavulanate (AUGMENTIN) 875-125 MG tablet Take 1 tablet by mouth every 12 (twelve) hours. 28 tablet 0  ? diphenhydrAMINE (BENADRYL) 25 MG tablet Take 25 mg by mouth at bedtime.    ? insulin detemir (LEVEMIR) 100 UNIT/ML FlexPen Inject 12 Units into the skin daily. 15 mL 0  ? Ipratropium-Albuterol (COMBIVENT) 20-100 MCG/ACT AERS respimat Inhale 1 puff into the lungs every 6 (six) hours as needed for wheezing. 4 g 0  ? losartan (COZAAR) 50 MG  tablet Take 50 mg by mouth daily.    ? Melatonin 10 MG CAPS Take 10 mg by mouth at bedtime.     ? meloxicam (MOBIC) 15 MG tablet Take 15 mg by mouth daily as needed for pain.    ? metFORMIN (GLUCOPHAGE) 500 MG tablet Take 500 mg by mouth daily.    ? metoprolol succinate (TOPROL-XL) 50 MG 24 hr tablet Take 50 mg by mouth daily.    ? omeprazole (PRILOSEC) 20 MG capsule Take 20 mg by mouth 2 (two) times daily.    ? phenol (CHLORASEPTIC) 1.4 % LIQD Use as directed 1 spray in the mouth or throat as needed for throat irritation / pain. 20 mL 0  ? predniSONE (DELTASONE) 20 MG tablet Take 2 tablets (40 mg total) by mouth daily with breakfast. 60 tablet 1  ? rosuvastatin (CRESTOR) 10 MG tablet Take 10 mg by mouth daily.    ? sulfamethoxazole-trimethoprim (BACTRIM DS) 800-160 MG tablet Take 1 tablet by mouth 3 (three) times a week. 12 tablet 1  ? venlafaxine (EFFEXOR) 37.5 MG tablet Take 37.5 mg by mouth daily.    ? Accu-Chek Softclix Lancets lancets Use to test blood sugars up to 4 times daily as directed. 100 each 0  ? Blood Glucose Monitoring Suppl (BLOOD GLUCOSE MONITOR SYSTEM) w/Device KIT Use to test  blood sugars up to 4 times daily. 1 kit 0  ? Cholecalciferol (VITAMIN D3 PO) Take 1 tablet by mouth daily. (Patient not taking: Reported on 08/17/2021)    ? glucose blood (ACCU-CHEK GUIDE) test strip Use t

## 2021-08-18 ENCOUNTER — Telehealth: Payer: Self-pay | Admitting: Hematology

## 2021-08-18 NOTE — Telephone Encounter (Signed)
Scheduled follow-up appointment per 4/25 los. Patient is aware. ?

## 2021-08-20 ENCOUNTER — Encounter: Payer: Self-pay | Admitting: Hematology

## 2021-08-24 ENCOUNTER — Telehealth: Payer: Self-pay | Admitting: Pulmonary Disease

## 2021-08-24 NOTE — Telephone Encounter (Signed)
Surgeons are calling to see if we can taper this patients prednisone before she goes into surgery. You currently have her on '40mg'$  of prednisone daily and her surgery is scheduled for 09/03/2021. ? ?Dr Erin Fulling please advise ?

## 2021-08-25 NOTE — Progress Notes (Signed)
?  ? ? ? ? ?[facility] for Infectious Disease ? ?CHIEF COMPLAINT:   ? ?Follow up for abnormal CT scan ? ?SUBJECTIVE:   ? ?Meghan Alexander is a 66 y.o. female with PMHx as below who presents to the clinic for abnormal chest CT scan.  ? ?Patient is here today for scheduled follow-up after recent hospitalization at [facility] from 4/10 through 08/09/2021 for worsening respiratory symptoms with diffuse groundglass opacities noted on CT of her chest.  She had been followed as an outpatient and prescribed 3 courses of antibiotics without improvement.  She was subsequently admitted.  She underwent bronchoscopy with BAL on 4/11.  BAL cell count was primarily lymphocytic and pathology did not indicate malignant cells on brushings.  There was initial concern for actinomyces on cytology report, however, after discussion with the pathologist who read her slides this was felt more likely to be a contaminant and not consistent with invasive infection.  Her BAL cultures grew strep mitis of unclear significance as well.  She was prescribed an empiric 14-day course of Augmentin at discharge.  She was also started on high-dose steroids and discharged with prednisone 40 mg daily.  The rest of her infectious work-up was negative with negative QuantiFERON, negative Fungitell, negative serology for cocci and blasto.  Her AFB smears were negative and cultures are still pending for 6 weeks. ? ? ?The possibility of rituximab induced lung toxicity was also entertained as she had been on this for over 1 year with her last infusion having been in November 2022.  She was seen by pulmonology as an outpatient by Dr. Erin Fulling on 4/19.  It was felt that her respiratory failure seemed secondary to an inflammatory process.  At that time she reported feeling better after starting high-dose steroids and the plan is for prolonged course with repeat follow-up imaging.  She continues to take her prednisone and was also prescribed Bactrim  prophylaxis. ? ? ?Today, she reports continued slow improvement with steroids.  She feels that her respiratory symptoms are improving daily.  She is having surgery on her ankle in a couple weeks and pulmonary is managing her steroids.  No fevers, chills. ? ?Please see A&P for the details of today's visit and status of the patient's medical problems.  ? ?Patient's Medications  ?New Prescriptions  ? No medications on file  ?Previous Medications  ? ACCU-CHEK SOFTCLIX LANCETS LANCETS    Use to test blood sugars up to 4 times daily as directed.  ? ACYCLOVIR (ZOVIRAX) 400 MG TABLET    TAKE 1 TABLET(400 MG) BY MOUTH DAILY  ? BLOOD GLUCOSE MONITORING SUPPL (BLOOD GLUCOSE MONITOR SYSTEM) W/DEVICE KIT    Use to test blood sugars up to 4 times daily.  ? CHOLECALCIFEROL (VITAMIN D3 PO)    Take 1 tablet by mouth daily.  ? DIPHENHYDRAMINE (BENADRYL) 25 MG TABLET    Take 25 mg by mouth at bedtime.  ? GLUCOSE BLOOD (ACCU-CHEK GUIDE) TEST STRIP    Use to test blood sugars up to 4 times daily as directed.  ? GUAIFENESIN (ROBITUSSIN) 100 MG/5ML LIQUID    Take 5 mLs by mouth every 4 (four) hours as needed for cough or to loosen phlegm.  ? INSULIN DETEMIR (LEVEMIR) 100 UNIT/ML FLEXPEN    Inject 12 Units into the skin daily.  ? INSULIN PEN NEEDLE 32G X 4 MM MISC    Use to inject insulin up to 4 times daily as directed.  ? IPRATROPIUM-ALBUTEROL (COMBIVENT) 20-100 MCG/ACT AERS  RESPIMAT    Inhale 1 puff into the lungs every 6 (six) hours as needed for wheezing.  ? LOSARTAN (COZAAR) 50 MG TABLET    Take 50 mg by mouth daily.  ? MELATONIN 10 MG CAPS    Take 10 mg by mouth at bedtime.   ? MELOXICAM (MOBIC) 15 MG TABLET    Take 15 mg by mouth daily as needed for pain.  ? METFORMIN (GLUCOPHAGE) 500 MG TABLET    Take 500 mg by mouth daily.  ? METOPROLOL SUCCINATE (TOPROL-XL) 50 MG 24 HR TABLET    Take 50 mg by mouth daily.  ? MULTIPLE VITAMIN (MULTIVITAMIN) TABLET    Take 1 tablet by mouth daily.  ? OMEPRAZOLE (PRILOSEC) 20 MG CAPSULE    Take 20  mg by mouth 2 (two) times daily.  ? PHENOL (CHLORASEPTIC) 1.4 % LIQD    Use as directed 1 spray in the mouth or throat as needed for throat irritation / pain.  ? PREDNISONE (DELTASONE) 20 MG TABLET    Take 2 tablets (40 mg total) by mouth daily with breakfast.  ? ROSUVASTATIN (CRESTOR) 10 MG TABLET    Take 10 mg by mouth daily.  ? VENLAFAXINE (EFFEXOR) 37.5 MG TABLET    Take 37.5 mg by mouth daily.  ?Modified Medications  ? Modified Medication Previous Medication  ? SULFAMETHOXAZOLE-TRIMETHOPRIM (BACTRIM DS) 800-160 MG TABLET sulfamethoxazole-trimethoprim (BACTRIM DS) 800-160 MG tablet  ?    Take 1 tablet by mouth 3 (three) times a week.    Take 1 tablet by mouth 3 (three) times a week.  ?Discontinued Medications  ? AMOXICILLIN-CLAVULANATE (AUGMENTIN) 875-125 MG TABLET    Take 1 tablet by mouth every 12 (twelve) hours.  ?   ? ?Past Medical History:  ?Diagnosis Date  ? Allergy   ? year around allergies  ? Arthritis   ? fingers, knees, neck, back-osteoarthristis  ? Bronchitis   ? hx of  ? Complication of anesthesia   ? 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 GERD (gastroesophageal reflux disease)   ? hx of reflux-went away with elevating HOB   ? Hypertension   ? Pneumonia   ? hx of   ? PONV (postoperative nausea and vomiting)   ? Type 2 diabetes mellitus ([place]) 08/02/2021  ? ? ?Social History  ? ?Tobacco Use  ? Smoking status: Never  ? Smokeless tobacco: Never  ?Vaping Use  ? Vaping Use: Never used  ?Substance Use Topics  ? Alcohol use: No  ?  Alcohol/week: 0.0 standard drinks  ? Drug use: No  ? ? ?Family History  ?Problem Relation Age of Onset  ? Lung cancer Father   ?     1982 died from it  ? Hernia Mother   ? Breast cancer Maternal Aunt   ?     multiple   ? Breast cancer Maternal Grandmother   ? Breast cancer Paternal Grandmother   ? Breast cancer Cousin   ?     cousins multiple   ? Colon cancer Neg Hx   ? Colon polyps Neg Hx   ? Rectal cancer Neg Hx   ? Stomach cancer Neg Hx    ? ? ?Allergies  ?Allergen Reactions  ? Latex Other (See Comments)  ?  Blisters / skin peeling ?Blisters / skin peeling ?BLISTERS RASH   ? Codeine Nausea And Vomiting and Other (See Comments)  ?  hallucinations ?hallucinations ?HALLUCINATIONS.   ? Atenolol Cough  ?  Other Other (See Comments)  ?  DermaPlast? Used after surgery - caused swelling, itching, and wound broke open  ? Hydrocodone Nausea And Vomiting  ? Oxycodone Nausea And Vomiting  ? ? ?Review of Systems  ?All other systems reviewed and are negative. Except as noted above.  ? ? ?OBJECTIVE:   ? ?Vitals:  ? 08/26/21 0839  ?BP: 122/74  ?Pulse: 97  ?Temp: 98.4 ?F (36.9 ?C)  ?TempSrc: Oral  ?SpO2: 92%  ? ?There is no height or weight on file to calculate BMI. ? ?Physical Exam ?Constitutional:   ?   General: She is not in acute distress. ?   Appearance: Normal appearance.  ?HENT:  ?   Head: Normocephalic and atraumatic.  ?Eyes:  ?   Extraocular Movements: Extraocular movements intact.  ?   Conjunctiva/sclera: Conjunctivae normal.  ?Pulmonary:  ?   Effort: Pulmonary effort is normal. No respiratory distress.  ?Abdominal:  ?   General: There is no distension.  ?   Palpations: Abdomen is soft.  ?Musculoskeletal:     ?   General: Normal range of motion.  ?   Cervical back: Normal range of motion and neck supple.  ?   Comments: Right ankle in soft cast.   ?Neurological:  ?   General: No focal deficit present.  ?   Mental Status: She is alert and oriented to person, place, and time.  ?Psychiatric:     ?   Mood and Affect: Mood normal.     ?   Behavior: Behavior normal.  ? ? ? ?Labs and Microbiology: ? ?  Latest Ref Rng & Units 08/17/2021  ?  1:15 PM 08/09/2021  ?  3:35 AM 08/08/2021  ?  1:34 AM  ?CBC  ?WBC 4.0 - 10.5 K/uL 9.9   8.7   6.9    ?Hemoglobin 12.0 - 15.0 g/dL 10.2   9.5   10.2    ?Hematocrit 36.0 - 46.0 % 32.9   30.5   32.4    ?Platelets 150 - 400 K/uL 278   325   306    ? ? ?  Latest Ref Rng & Units 08/17/2021  ?  1:15 PM 08/08/2021  ?  1:34 AM 08/07/2021  ?   1:23 AM  ?CMP  ?Glucose 70 - 99 mg/dL 236   289   218    ?BUN 8 - 23 mg/dL _0 ?Creatinine 0.44 - 1.00 mg/dL 0.76   0.95   0.76    ?Sodium 135 - 145 mmol/L 137   137   140    ?Potassium 3.5 - 5.1 mmol/L 3.1

## 2021-08-26 ENCOUNTER — Other Ambulatory Visit: Payer: Self-pay

## 2021-08-26 ENCOUNTER — Encounter: Payer: Self-pay | Admitting: Internal Medicine

## 2021-08-26 ENCOUNTER — Ambulatory Visit: Payer: Medicare Other | Admitting: Internal Medicine

## 2021-08-26 DIAGNOSIS — J189 Pneumonia, unspecified organism: Secondary | ICD-10-CM | POA: Diagnosis not present

## 2021-08-26 MED ORDER — SULFAMETHOXAZOLE-TRIMETHOPRIM 800-160 MG PO TABS: 1.0000 | ORAL_TABLET | ORAL | 1 refills | Status: DC

## 2021-08-26 NOTE — Assessment & Plan Note (Signed)
Patient here today for follow-up of abnormal chest imaging showing diffuse groundglass opacities.  BAL was unremarkable for an infectious etiology.  Her BAL cultures grew strep mitis and she was treated with a course of Augmentin, however, I do not think this would be contributory to her CT chest findings and was likely just a bystander.  Her AFB cultures are pending but smears were negative.  Testing for pneumocystis was not done, however, Fungitell and LDH were negative making this diagnosis less likely.  She is being treated presumptively for an inflammatory process with high-dose steroids per pulmonology and has had improvement on high dose prednisone.  Hematology thinks rituximab induced pneumonitis is unlikely given the timing, however, her symptoms developed within about a month of her last rituximab infusion.  CT chest imaging also noted a patulous fluid-filled esophagus consistent with her known history of achalasia.  Would possibly consider silent aspiration events to be contributing to her abnormal CT chest findings.  If she shows no improvement on follow-up imaging with high-dose steroids, query whether she would benefit from GI evaluation and possible Bravo placement.  Agree with Bactrim for PCP prophylaxis while on high-dose steroids and will send refill today.  She will continue to follow-up with pulmonology and hematology/oncology.  Given that her ID work-up has been unremarkable, will follow-up with [place] as needed. ?

## 2021-08-26 NOTE — Telephone Encounter (Signed)
Lm x1 for patient.  

## 2021-08-26 NOTE — Telephone Encounter (Signed)
I spoke with her orthopedic surgeon. Please let patient know to drop her prednisone dose to '20mg'$  daily (1 tablet daily) over the next 2 weeks and we will see in her follow up as scheduled. She is to continue Bactrim 3 times per week as well. ? ?Thanks, ?JD ? ?

## 2021-08-26 NOTE — Patient Instructions (Signed)
I refilled Bactrim 1 DS tablet to take Mon, Wed, Fri while you are on higher dose prednisone of '20mg'$  or more daily ?

## 2021-08-27 NOTE — Telephone Encounter (Signed)
Called and spoke with patient. Advised patient to drop her prednisone to 20 mg daily and to continue her bactrim 3 times a week. Patient verbalized understanding. Nothing further needed.  ?

## 2021-09-01 ENCOUNTER — Other Ambulatory Visit: Payer: Self-pay

## 2021-09-01 ENCOUNTER — Encounter (HOSPITAL_COMMUNITY): Payer: Self-pay | Admitting: Orthopedic Surgery

## 2021-09-01 NOTE — Progress Notes (Signed)
PCP - Dr. Mancel Bale ?Cardiologist - denies ?EKG - 08/03/21 ?Chest x-ray - 08/02/21 ?ECHO -  ?Cardiac Cath -  ?CPAP -  ? ?ERAS Protcol - 0900 ?COVID TEST-  ? ?Anesthesia review: n/a ? ?------------- ? ?SDW INSTRUCTIONS: ? ?Your procedure is scheduled on Friday 5/12. Please report to [facility] Main Entrance "A" at 0930 A.M., and check in at the Admitting office. Call this number if you have problems the morning of surgery: [phone] ? ? ?Remember: Do not eat after midnight the night before your surgery ? ?You may drink clear liquids until 0900 AM the morning of your surgery.   ?Clear liquids allowed are: Water, Non-Citrus Juices (without pulp), Carbonated Beverages, Clear Tea, Black Coffee Only, and Gatorade (do not add anything to drinks)  ?  ?Medications to take morning of surgery with a sip of water include: ?metoprolol succinate (TOPROL-XL)  ?omeprazole (PRILOSEC)  ?predniSONE (DELTASONE)  ?rosuvastatin (CRESTOR)  ?venlafaxine [facility])  ? ?** PLEASE check your blood sugar the morning of your surgery when you wake up and every 2 hours until you get to the Short Stay unit. ? ?If your blood sugar is less than 70 mg/dL, you will need to treat for low blood sugar: ?Do not take insulin. ?Treat a low blood sugar (less than 70 mg/dL) with ? cup of clear juice (cranberry or apple), 4 glucose tablets, OR glucose gel. ?Recheck blood sugar in 15 minutes after treatment (to make sure it is greater than 70 mg/dL). If your blood sugar is not greater than 70 mg/dL on recheck, call [phone] for further instructions. ? ? ?insulin detemir (LEVEMIR) (pt takes 20 units usually daily) ?5/11: take as usual ?5/12: none ? ?metFORMIN (GLUCOPHAGE)  ?5/11: usual ?5/12: none ? ? ?As of today, STOP taking any Aspirin (unless otherwise instructed by your surgeon), Aleve, Naproxen, Ibuprofen, Motrin, Advil, Goody's, BC's, all herbal medications, fish oil, and all vitamins. ? ?  ?The Morning of Surgery ?Do not wear jewelry, make-up or nail  polish. ?Do not wear lotions, powders, or perfumes, or deodorant ?Do not bring valuables to the hospital. ? is not responsible for any belongings or valuables. ? ?If you are a smoker, DO NOT Smoke 24 hours prior to surgery ? ?If you wear a CPAP at night please bring your mask the morning of surgery  ? ?Remember that you must have someone to transport you home after your surgery, and remain with you for 24 hours if you are discharged the same day. ? ?Please bring cases for contacts, glasses, hearing aids, dentures or bridgework because it cannot be worn into surgery.  ? ?Patients discharged the day of surgery will not be allowed to drive home.  ? ?Please shower the NIGHT BEFORE/MORNING OF SURGERY (use antibacterial soap like DIAL soap if possible). Wear comfortable clothes the morning of surgery. Oral Hygiene is also important to reduce your risk of infection.  Remember - BRUSH YOUR TEETH THE MORNING OF SURGERY WITH YOUR REGULAR TOOTHPASTE ? ?Patient denies shortness of breath, fever, cough and chest pain.  ? ? ?   ? ?

## 2021-09-03 ENCOUNTER — Ambulatory Visit (HOSPITAL_COMMUNITY): Payer: Medicare Other

## 2021-09-03 ENCOUNTER — Ambulatory Visit (HOSPITAL_COMMUNITY): Payer: Medicare Other | Admitting: Certified Registered Nurse Anesthetist

## 2021-09-03 ENCOUNTER — Encounter (HOSPITAL_COMMUNITY)
Admission: RE | Disposition: A | Discharge status: Home / Self Care | Payer: Self-pay | Source: Home / Self Care | Attending: Orthopedic Surgery

## 2021-09-03 ENCOUNTER — Ambulatory Visit (HOSPITAL_BASED_OUTPATIENT_CLINIC_OR_DEPARTMENT_OTHER): Payer: Medicare Other | Admitting: Certified Registered Nurse Anesthetist

## 2021-09-03 ENCOUNTER — Encounter (HOSPITAL_COMMUNITY): Payer: Self-pay | Admitting: Orthopedic Surgery

## 2021-09-03 ENCOUNTER — Ambulatory Visit (HOSPITAL_COMMUNITY)
Admission: RE | Admit: 2021-09-03 | Discharge: 2021-09-04 | Disposition: A | Discharge status: Home / Self Care | Payer: Medicare Other | Attending: Orthopedic Surgery | Admitting: Orthopedic Surgery

## 2021-09-03 ENCOUNTER — Other Ambulatory Visit: Payer: Self-pay

## 2021-09-03 DIAGNOSIS — S82891A Other fracture of right lower leg, initial encounter for closed fracture: Secondary | ICD-10-CM | POA: Diagnosis present

## 2021-09-03 DIAGNOSIS — J849 Interstitial pulmonary disease, unspecified: Secondary | ICD-10-CM | POA: Insufficient documentation

## 2021-09-03 DIAGNOSIS — I10 Essential (primary) hypertension: Secondary | ICD-10-CM | POA: Insufficient documentation

## 2021-09-03 DIAGNOSIS — Z8572 Personal history of non-Hodgkin lymphomas: Secondary | ICD-10-CM | POA: Insufficient documentation

## 2021-09-03 DIAGNOSIS — J189 Pneumonia, unspecified organism: Secondary | ICD-10-CM

## 2021-09-03 DIAGNOSIS — W07XXXA Fall from chair, initial encounter: Secondary | ICD-10-CM | POA: Insufficient documentation

## 2021-09-03 DIAGNOSIS — E119 Type 2 diabetes mellitus without complications: Secondary | ICD-10-CM | POA: Insufficient documentation

## 2021-09-03 DIAGNOSIS — Z7984 Long term (current) use of oral hypoglycemic drugs: Secondary | ICD-10-CM | POA: Insufficient documentation

## 2021-09-03 DIAGNOSIS — Z9221 Personal history of antineoplastic chemotherapy: Secondary | ICD-10-CM | POA: Diagnosis not present

## 2021-09-03 DIAGNOSIS — M1990 Unspecified osteoarthritis, unspecified site: Secondary | ICD-10-CM | POA: Diagnosis not present

## 2021-09-03 DIAGNOSIS — Z794 Long term (current) use of insulin: Secondary | ICD-10-CM | POA: Diagnosis not present

## 2021-09-03 DIAGNOSIS — S82841A Displaced bimalleolar fracture of right lower leg, initial encounter for closed fracture: Secondary | ICD-10-CM | POA: Insufficient documentation

## 2021-09-03 DIAGNOSIS — E1165 Type 2 diabetes mellitus with hyperglycemia: Secondary | ICD-10-CM

## 2021-09-03 DIAGNOSIS — K219 Gastro-esophageal reflux disease without esophagitis: Secondary | ICD-10-CM | POA: Diagnosis not present

## 2021-09-03 HISTORY — PX: ORIF ANKLE FRACTURE: SHX5408

## 2021-09-03 LAB — GLUCOSE, CAPILLARY
Glucose-Capillary: 179 mg/dL — ABNORMAL HIGH (ref 70–99)
Glucose-Capillary: 214 mg/dL — ABNORMAL HIGH (ref 70–99)
Glucose-Capillary: 224 mg/dL — ABNORMAL HIGH (ref 70–99)
Glucose-Capillary: 193 mg/dL — ABNORMAL HIGH (ref 70–99)
Glucose-Capillary: 346 mg/dL — ABNORMAL HIGH (ref 70–99)

## 2021-09-03 LAB — FUNGUS CULTURE WITH STAIN

## 2021-09-03 LAB — FUNGUS CULTURE RESULT

## 2021-09-03 LAB — SURGICAL PCR SCREEN
MRSA, PCR: NEGATIVE
Staphylococcus aureus: NEGATIVE

## 2021-09-03 LAB — FUNGAL ORGANISM REFLEX

## 2021-09-03 SURGERY — OPEN REDUCTION INTERNAL FIXATION (ORIF) ANKLE FRACTURE
Anesthesia: General | Site: Ankle | Laterality: Right

## 2021-09-03 MED ORDER — DOCUSATE SODIUM 100 MG PO CAPS
100.0000 mg | ORAL_CAPSULE | Freq: Two times a day (BID) | ORAL | Status: DC
  Administered 2021-09-03 – 2021-09-04 (×2): 100 mg via ORAL
  Filled 2021-09-03 (×2): qty 1

## 2021-09-03 MED ORDER — ONDANSETRON HCL 4 MG/2ML IJ SOLN
INTRAMUSCULAR | Status: DC | PRN
  Administered 2021-09-03: 4 mg via INTRAVENOUS

## 2021-09-03 MED ORDER — ONDANSETRON HCL 4 MG PO TABS: 4.0000 mg | ORAL_TABLET | Freq: Three times a day (TID) | ORAL | 0 refills | Status: AC | PRN

## 2021-09-03 MED ORDER — INSULIN ASPART 100 UNIT/ML IJ SOLN
0.0000 [IU] | Freq: Every day | INTRAMUSCULAR | Status: DC
  Administered 2021-09-03: 4 [IU] via SUBCUTANEOUS

## 2021-09-03 MED ORDER — PREDNISONE 20 MG PO TABS
20.0000 mg | ORAL_TABLET | Freq: Every day | ORAL | Status: DC
  Administered 2021-09-04: 20 mg via ORAL
  Filled 2021-09-03: qty 1

## 2021-09-03 MED ORDER — TRAMADOL HCL 50 MG PO TABS: 50.0000 mg | ORAL_TABLET | Freq: Four times a day (QID) | ORAL | 0 refills | Status: AC | PRN

## 2021-09-03 MED ORDER — ASPIRIN EC 81 MG PO TBEC: 81.0000 mg | DELAYED_RELEASE_TABLET | Freq: Two times a day (BID) | ORAL | 0 refills | Status: AC

## 2021-09-03 MED ORDER — DIPHENHYDRAMINE HCL 25 MG PO CAPS
25.0000 mg | ORAL_CAPSULE | Freq: Every day | ORAL | Status: DC
  Administered 2021-09-03: 25 mg via ORAL
  Filled 2021-09-03: qty 1

## 2021-09-03 MED ORDER — CHLORHEXIDINE GLUCONATE 0.12 % MT SOLN
OROMUCOSAL | Status: AC
Start: 2021-09-03 — End: 2021-09-03
  Administered 2021-09-03: 15 mL via OROMUCOSAL
  Filled 2021-09-03: qty 15

## 2021-09-03 MED ORDER — CEFAZOLIN SODIUM-DEXTROSE 2-4 GM/100ML-% IV SOLN
2.0000 g | Freq: Four times a day (QID) | INTRAVENOUS | Status: AC
  Administered 2021-09-03 – 2021-09-04 (×3): 2 g via INTRAVENOUS
  Filled 2021-09-03 (×3): qty 100

## 2021-09-03 MED ORDER — VENLAFAXINE HCL ER 37.5 MG PO CP24
37.5000 mg | ORAL_CAPSULE | Freq: Every day | ORAL | Status: DC
  Administered 2021-09-04: 37.5 mg via ORAL
  Filled 2021-09-03: qty 1

## 2021-09-03 MED ORDER — PREDNISONE 20 MG PO TABS: 20.0000 mg | ORAL_TABLET | Freq: Every day | ORAL | 1 refills | Status: DC

## 2021-09-03 MED ORDER — ASPIRIN EC 81 MG PO TBEC
81.0000 mg | DELAYED_RELEASE_TABLET | Freq: Two times a day (BID) | ORAL | Status: DC
Start: 2021-09-04 — End: 2021-09-04
  Administered 2021-09-04: 81 mg via ORAL
  Filled 2021-09-03: qty 1

## 2021-09-03 MED ORDER — MIDAZOLAM HCL 2 MG/2ML IJ SOLN
INTRAMUSCULAR | Status: AC
  Administered 2021-09-03: 2 mg via INTRAVENOUS
  Filled 2021-09-03: qty 2

## 2021-09-03 MED ORDER — TRAMADOL HCL 50 MG PO TABS
50.0000 mg | ORAL_TABLET | Freq: Four times a day (QID) | ORAL | Status: DC | PRN
  Administered 2021-09-03 – 2021-09-04 (×3): 50 mg via ORAL
  Filled 2021-09-03 (×3): qty 1

## 2021-09-03 MED ORDER — BUPIVACAINE-EPINEPHRINE (PF) 0.5% -1:200000 IJ SOLN
INTRAMUSCULAR | Status: DC | PRN
  Administered 2021-09-03: 15 mL via PERINEURAL

## 2021-09-03 MED ORDER — ACETAMINOPHEN 500 MG PO TABS
1000.0000 mg | ORAL_TABLET | Freq: Four times a day (QID) | ORAL | Status: DC
  Administered 2021-09-03 – 2021-09-04 (×3): 1000 mg via ORAL
  Filled 2021-09-03 (×3): qty 2

## 2021-09-03 MED ORDER — PANTOPRAZOLE SODIUM 40 MG PO TBEC
40.0000 mg | DELAYED_RELEASE_TABLET | Freq: Every day | ORAL | Status: DC
  Administered 2021-09-04: 40 mg via ORAL
  Filled 2021-09-03: qty 1

## 2021-09-03 MED ORDER — MELATONIN 5 MG PO TABS
10.0000 mg | ORAL_TABLET | Freq: Every day | ORAL | Status: DC
  Administered 2021-09-03: 10 mg via ORAL
  Filled 2021-09-03: qty 2

## 2021-09-03 MED ORDER — METOPROLOL SUCCINATE ER 50 MG PO TB24
50.0000 mg | ORAL_TABLET | Freq: Every day | ORAL | Status: DC
  Administered 2021-09-04: 50 mg via ORAL
  Filled 2021-09-03 (×2): qty 1

## 2021-09-03 MED ORDER — ACYCLOVIR 400 MG PO TABS: 400.0000 mg | ORAL_TABLET | Freq: Every day | ORAL | Status: DC

## 2021-09-03 MED ORDER — METFORMIN HCL 500 MG PO TABS
500.0000 mg | ORAL_TABLET | Freq: Every day | ORAL | Status: DC
  Administered 2021-09-04: 500 mg via ORAL
  Filled 2021-09-03: qty 1

## 2021-09-03 MED ORDER — ADULT MULTIVITAMIN W/MINERALS CH
1.0000 | ORAL_TABLET | Freq: Every day | ORAL | Status: DC
  Administered 2021-09-04: 1 via ORAL
  Filled 2021-09-03: qty 1

## 2021-09-03 MED ORDER — ALBUTEROL SULFATE HFA 108 (90 BASE) MCG/ACT IN AERS
INHALATION_SPRAY | RESPIRATORY_TRACT | Status: AC
  Filled 2021-09-03: qty 6.7

## 2021-09-03 MED ORDER — VANCOMYCIN HCL 500 MG IV SOLR
INTRAVENOUS | Status: AC
  Filled 2021-09-03: qty 10

## 2021-09-03 MED ORDER — FENTANYL CITRATE (PF) 100 MCG/2ML IJ SOLN: 25.0000 ug | INTRAMUSCULAR | Status: DC | PRN

## 2021-09-03 MED ORDER — BUPIVACAINE-EPINEPHRINE (PF) 0.5% -1:200000 IJ SOLN: INTRAMUSCULAR | Status: DC | PRN

## 2021-09-03 MED ORDER — LACTATED RINGERS IV SOLN: INTRAVENOUS | Status: DC

## 2021-09-03 MED ORDER — CEFAZOLIN SODIUM-DEXTROSE 2-4 GM/100ML-% IV SOLN
2.0000 g | INTRAVENOUS | Status: AC
  Administered 2021-09-03: 2 g via INTRAVENOUS

## 2021-09-03 MED ORDER — INSULIN ASPART 100 UNIT/ML IJ SOLN
0.0000 [IU] | INTRAMUSCULAR | Status: AC | PRN
  Administered 2021-09-03: 2 [IU] via SUBCUTANEOUS

## 2021-09-03 MED ORDER — ALBUTEROL SULFATE HFA 108 (90 BASE) MCG/ACT IN AERS
INHALATION_SPRAY | RESPIRATORY_TRACT | Status: DC | PRN
  Administered 2021-09-03: 4 via RESPIRATORY_TRACT

## 2021-09-03 MED ORDER — DEXAMETHASONE SODIUM PHOSPHATE 10 MG/ML IJ SOLN
INTRAMUSCULAR | Status: DC | PRN
Start: 2021-09-03 — End: 2021-09-03
  Administered 2021-09-03: 10 mg via INTRAVENOUS

## 2021-09-03 MED ORDER — LOSARTAN POTASSIUM 50 MG PO TABS
50.0000 mg | ORAL_TABLET | Freq: Every day | ORAL | Status: DC
  Administered 2021-09-04: 50 mg via ORAL
  Filled 2021-09-03 (×2): qty 1

## 2021-09-03 MED ORDER — POLYETHYLENE GLYCOL 3350 17 G PO PACK: 17.0000 g | PACK | Freq: Every day | ORAL | Status: DC | PRN

## 2021-09-03 MED ORDER — ONDANSETRON HCL 4 MG PO TABS: 4.0000 mg | ORAL_TABLET | Freq: Four times a day (QID) | ORAL | Status: DC | PRN

## 2021-09-03 MED ORDER — CHLORHEXIDINE GLUCONATE 0.12 % MT SOLN
15.0000 mL | OROMUCOSAL | Status: AC
  Filled 2021-09-03: qty 15

## 2021-09-03 MED ORDER — INSULIN ASPART 100 UNIT/ML IJ SOLN
0.0000 [IU] | Freq: Three times a day (TID) | INTRAMUSCULAR | Status: DC
  Administered 2021-09-04: 5 [IU] via SUBCUTANEOUS

## 2021-09-03 MED ORDER — INSULIN ASPART 100 UNIT/ML IJ SOLN
6.0000 [IU] | Freq: Once | INTRAMUSCULAR | Status: AC
  Administered 2021-09-03: 6 [IU] via SUBCUTANEOUS

## 2021-09-03 MED ORDER — FENTANYL CITRATE (PF) 100 MCG/2ML IJ SOLN
INTRAMUSCULAR | Status: AC
  Administered 2021-09-03: 50 ug via INTRAVENOUS
  Filled 2021-09-03: qty 2

## 2021-09-03 MED ORDER — ACETAMINOPHEN 500 MG PO TABS: 1000.0000 mg | ORAL_TABLET | Freq: Three times a day (TID) | ORAL | 0 refills | Status: DC | PRN

## 2021-09-03 MED ORDER — CEFAZOLIN SODIUM-DEXTROSE 2-4 GM/100ML-% IV SOLN
INTRAVENOUS | Status: AC
  Filled 2021-09-03: qty 100

## 2021-09-03 MED ORDER — PROPOFOL 10 MG/ML IV BOLUS
INTRAVENOUS | Status: AC
  Filled 2021-09-03: qty 20

## 2021-09-03 MED ORDER — ONDANSETRON HCL 4 MG/2ML IJ SOLN: 4.0000 mg | Freq: Four times a day (QID) | INTRAMUSCULAR | Status: DC | PRN

## 2021-09-03 MED ORDER — PHENYLEPHRINE HCL-NACL 20-0.9 MG/250ML-% IV SOLN
INTRAVENOUS | Status: DC | PRN
  Administered 2021-09-03: 30 ug/min via INTRAVENOUS

## 2021-09-03 MED ORDER — BUPIVACAINE-EPINEPHRINE (PF) 0.5% -1:200000 IJ SOLN
INTRAMUSCULAR | Status: DC | PRN
  Administered 2021-09-03: 20 mL via PERINEURAL

## 2021-09-03 MED ORDER — VANCOMYCIN HCL 500 MG IV SOLR
INTRAVENOUS | Status: DC | PRN
  Administered 2021-09-03: 500 mg

## 2021-09-03 MED ORDER — ACETAMINOPHEN 325 MG PO TABS: 325.0000 mg | ORAL_TABLET | Freq: Four times a day (QID) | ORAL | Status: DC | PRN

## 2021-09-03 MED ORDER — FENTANYL CITRATE (PF) 250 MCG/5ML IJ SOLN
INTRAMUSCULAR | Status: AC
  Filled 2021-09-03: qty 5

## 2021-09-03 MED ORDER — INSULIN ASPART 100 UNIT/ML IJ SOLN
INTRAMUSCULAR | Status: AC
Start: 2021-09-03 — End: 2021-09-03
  Administered 2021-09-03: 2 [IU] via SUBCUTANEOUS
  Filled 2021-09-03: qty 1

## 2021-09-03 MED ORDER — PHENOL 1.4 % MT LIQD: 1.0000 | OROMUCOSAL | Status: DC | PRN

## 2021-09-03 MED ORDER — PROPOFOL 10 MG/ML IV BOLUS
INTRAVENOUS | Status: DC | PRN
  Administered 2021-09-03: 160 mg via INTRAVENOUS

## 2021-09-03 MED ORDER — FENTANYL CITRATE (PF) 250 MCG/5ML IJ SOLN
INTRAMUSCULAR | Status: DC | PRN
  Administered 2021-09-03 (×2): 25 ug via INTRAVENOUS

## 2021-09-03 MED ORDER — VENLAFAXINE HCL 37.5 MG PO TABS: 37.5000 mg | ORAL_TABLET | Freq: Every day | ORAL | Status: DC

## 2021-09-03 MED ORDER — 0.9 % SODIUM CHLORIDE (POUR BTL) OPTIME
TOPICAL | Status: DC | PRN
  Administered 2021-09-03: 1000 mL

## 2021-09-03 MED ORDER — MIDAZOLAM HCL 2 MG/2ML IJ SOLN: 2.0000 mg | Freq: Once | INTRAMUSCULAR | Status: AC

## 2021-09-03 MED ORDER — ROSUVASTATIN CALCIUM 5 MG PO TABS
10.0000 mg | ORAL_TABLET | Freq: Every day | ORAL | Status: DC
  Administered 2021-09-04: 10 mg via ORAL
  Filled 2021-09-03: qty 2

## 2021-09-03 MED ORDER — LIDOCAINE 2% (20 MG/ML) 5 ML SYRINGE
INTRAMUSCULAR | Status: DC | PRN
  Administered 2021-09-03: 60 mg via INTRAVENOUS

## 2021-09-03 MED ORDER — FENTANYL CITRATE (PF) 100 MCG/2ML IJ SOLN: 50.0000 ug | Freq: Once | INTRAMUSCULAR | Status: AC

## 2021-09-03 SURGICAL SUPPLY — 101 items
ADH SKN CLS APL DERMABOND .7 (GAUZE/BANDAGES/DRESSINGS)
ALCOHOL 70% 16 OZ (MISCELLANEOUS) ×2 IMPLANT
BAG COUNTER SPONGE SURGICOUNT (BAG) ×2 IMPLANT
BAG SPNG CNTER NS LX DISP (BAG) ×1
BANDAGE ESMARK 6X9 LF (GAUZE/BANDAGES/DRESSINGS) IMPLANT
BIT DRILL 2 CANN GRADUATED (BIT) ×1 IMPLANT
BIT DRILL 2.5 CANN LNG (BIT) ×1 IMPLANT
BIT DRILL 2.6 CANN (BIT) ×1 IMPLANT
BIT DRILL 2.7 (BIT) ×2
BIT DRILL 2.7X2.7/3XSCR ANKL (BIT) IMPLANT
BIT DRILL 3.5 CANN STRL (BIT) ×1 IMPLANT
BIT DRL 2.7X2.7/3XSCR ANKL (BIT) ×1
BNDG CMPR 9X6 STRL LF SNTH (GAUZE/BANDAGES/DRESSINGS)
BNDG COHESIVE 6X5 TAN NS LF (GAUZE/BANDAGES/DRESSINGS) ×1 IMPLANT
BNDG ELASTIC 4X5.8 VLCR STR LF (GAUZE/BANDAGES/DRESSINGS) ×1 IMPLANT
BNDG ELASTIC 6X5.8 VLCR STR LF (GAUZE/BANDAGES/DRESSINGS) ×1 IMPLANT
BNDG ESMARK 6X9 LF (GAUZE/BANDAGES/DRESSINGS)
CANISTER SUCT 3000ML PPV (MISCELLANEOUS) ×2 IMPLANT
COVER SURGICAL LIGHT HANDLE (MISCELLANEOUS) ×2 IMPLANT
CUFF TOURN SGL QUICK 34 (TOURNIQUET CUFF)
CUFF TRNQT CYL 34X4.125X (TOURNIQUET CUFF) IMPLANT
DERMABOND ADVANCED (GAUZE/BANDAGES/DRESSINGS)
DERMABOND ADVANCED .7 DNX12 (GAUZE/BANDAGES/DRESSINGS) ×1 IMPLANT
DRAPE C-ARM 42X72 X-RAY (DRAPES) ×2 IMPLANT
DRAPE C-ARMOR (DRAPES) ×2 IMPLANT
DRAPE HIP W/POCKET STRL (MISCELLANEOUS) ×1 IMPLANT
DRAPE IMP U-DRAPE 54X76 (DRAPES) ×4 IMPLANT
DRAPE INCISE 23X17 IOBAN STRL (DRAPES) ×1
DRAPE INCISE 23X17 STRL (DRAPES) ×1 IMPLANT
DRAPE INCISE IOBAN 23X17 STRL (DRAPES) ×1 IMPLANT
DRAPE INCISE IOBAN 66X45 STRL (DRAPES) ×2 IMPLANT
DRAPE U-SHAPE 47X51 STRL (DRAPES) ×2 IMPLANT
DRSG ADAPTIC 3X8 NADH LF (GAUZE/BANDAGES/DRESSINGS) ×1 IMPLANT
DRSG AQUACEL AG ADV 3.5X 6 (GAUZE/BANDAGES/DRESSINGS) ×1 IMPLANT
DRSG PAD ABDOMINAL 8X10 ST (GAUZE/BANDAGES/DRESSINGS) ×1 IMPLANT
ELECT REM PT RETURN 9FT ADLT (ELECTROSURGICAL) ×2
ELECTRODE REM PT RTRN 9FT ADLT (ELECTROSURGICAL) ×1 IMPLANT
GAUZE SPONGE 4X4 12PLY STRL (GAUZE/BANDAGES/DRESSINGS) ×1 IMPLANT
GLOVE BIO SURGEON STRL SZ7.5 (GLOVE) ×4 IMPLANT
GLOVE BIOGEL PI IND STRL 7.5 (GLOVE) ×1 IMPLANT
GLOVE BIOGEL PI IND STRL 8 (GLOVE) ×1 IMPLANT
GLOVE BIOGEL PI INDICATOR 7.5 (GLOVE) ×1
GLOVE BIOGEL PI INDICATOR 8 (GLOVE) ×1
GLOVE SRG 8 PF TXTR STRL LF DI (GLOVE) ×1 IMPLANT
GLOVE SURG ENC MOIS LTX SZ8 (GLOVE) ×4 IMPLANT
GLOVE SURG UNDER POLY LF SZ8 (GLOVE) ×2
GOWN STRL REUS W/ TWL LRG LVL3 (GOWN DISPOSABLE) ×2 IMPLANT
GOWN STRL REUS W/ TWL XL LVL3 (GOWN DISPOSABLE) ×1 IMPLANT
GOWN STRL REUS W/TWL LRG LVL3 (GOWN DISPOSABLE) ×4
GOWN STRL REUS W/TWL XL LVL3 (GOWN DISPOSABLE) ×2
GUIDEWIRE 1.35MM (WIRE) ×2 IMPLANT
HOOD PEEL AWAY FLYTE STAYCOOL (MISCELLANEOUS) ×2 IMPLANT
K-WIRE BB-TAK (WIRE) ×4
KIT BASIN OR (CUSTOM PROCEDURE TRAY) ×2 IMPLANT
KIT TURNOVER KIT B (KITS) ×2 IMPLANT
KWIRE BB-TAK (WIRE) IMPLANT
MANIFOLD NEPTUNE II (INSTRUMENTS) IMPLANT
NEEDLE 22X1 1/2 (OR ONLY) (NEEDLE) IMPLANT
NS IRRIG 1000ML POUR BTL (IV SOLUTION) ×2 IMPLANT
PACK ORTHO EXTREMITY (CUSTOM PROCEDURE TRAY) ×2 IMPLANT
PAD ARMBOARD 7.5X6 YLW CONV (MISCELLANEOUS) ×4 IMPLANT
PAD CAST 4YDX4 CTTN HI CHSV (CAST SUPPLIES) IMPLANT
PADDING CAST COTTON 4X4 STRL (CAST SUPPLIES) ×2
PLATE 5HOLE LOCKING 91MML (Plate) ×1 IMPLANT
SCREW COMP KREULOCK 2.7X14 (Screw) ×3 IMPLANT
SCREW COMP KREULOCK 2.7X16 (Screw) ×2 IMPLANT
SCREW LOW PROFILE 3.5X14 (Screw) ×2 IMPLANT
SCREW LOW PROFILE 4.0X40 (Screw) ×2 IMPLANT
SCREW NON-LOCKING 3.5X12MM (Screw) ×1 IMPLANT
SCREW NON-LOCKING 3.5X20 ANKLE (Screw) ×1 IMPLANT
SEALER BIPOLAR AQUA 2.3 (INSTRUMENTS) ×1 IMPLANT
SET INTERPULSE LAVAGE W/TIP (ORTHOPEDIC DISPOSABLE SUPPLIES) ×1 IMPLANT
SPONGE T-LAP 18X18 ~~LOC~~+RFID (SPONGE) ×3 IMPLANT
STOCKINETTE IMPERVIOUS 9X36 MD (GAUZE/BANDAGES/DRESSINGS) ×1 IMPLANT
STOCKINETTE IMPERVIOUS LG (DRAPES) ×2 IMPLANT
STRIP CLOSURE SKIN 1/2X4 (GAUZE/BANDAGES/DRESSINGS) IMPLANT
SUCTION FRAZIER HANDLE 10FR (MISCELLANEOUS) ×1
SUCTION TUBE FRAZIER 10FR DISP (MISCELLANEOUS) ×1 IMPLANT
SUT ETHIBOND 2 V 37 (SUTURE) ×4 IMPLANT
SUT ETHILON 3 0 FSL (SUTURE) ×2 IMPLANT
SUT ETHILON 3 0 PS 1 (SUTURE) ×1 IMPLANT
SUT MNCRL AB 4-0 PS2 18 (SUTURE) IMPLANT
SUT MON AB 2-0 CT1 27 (SUTURE) IMPLANT
SUT VIC AB 0 CT1 27 (SUTURE) ×2
SUT VIC AB 0 CT1 27XBRD ANBCTR (SUTURE) ×1 IMPLANT
SUT VIC AB 1 CT1 27 (SUTURE) ×2
SUT VIC AB 1 CT1 27XBRD ANTBC (SUTURE) ×1 IMPLANT
SUT VIC AB 2-0 CT2 27 (SUTURE) ×1 IMPLANT
SUT VIC AB 2-0 SH 27 (SUTURE) ×2
SUT VIC AB 2-0 SH 27XBRD (SUTURE) ×1 IMPLANT
SUT VLOC 180 0 9IN  GS21 (SUTURE) ×1
SUT VLOC 180 0 9IN GS21 (SUTURE) ×1 IMPLANT
SYR BULB IRRIG 60ML STRL (SYRINGE) ×2 IMPLANT
SYR CONTROL 10ML LL (SYRINGE) IMPLANT
TOWEL GREEN STERILE (TOWEL DISPOSABLE) ×2 IMPLANT
TOWEL GREEN STERILE FF (TOWEL DISPOSABLE) ×2 IMPLANT
TUBE CONNECTING 12X1/4 (SUCTIONS) ×2 IMPLANT
TUBE SUCT ARGYLE STRL (TUBING) ×2 IMPLANT
UNDERPAD 30X36 HEAVY ABSORB (UNDERPADS AND DIAPERS) ×2 IMPLANT
WATER STERILE IRR 1000ML POUR (IV SOLUTION) ×1 IMPLANT
YANKAUER SUCT BULB TIP NO VENT (SUCTIONS) IMPLANT

## 2021-09-03 NOTE — H&P (Addendum)
RE: Meghan Alexander, Meghan Alexander   ?PROGRESS NOTE: ?Meghan Alexander is a 66 year-old female who presents for evaluation of a right ankle injury.  She states that she fell off a chair on Saturday, April 29th. She was seen at Urgent Care, diagnosed with an ankle fracture and placed into an air cast.  She also had pain and discomfort in her left ankle on the lateral side, but she has been weight bearing on it for the past few days without any significant issues.  She denies pain in other joints or extremities at this time.  She does have a significant past medical history for non-Hodgkin's lymphoma, currently in remission.  She has also recently been hospitalized just a month ago after completing chemotherapy and immunotherapy where she was diagnosed with a pneumococcal pneumonia, and is currently being treated with high dose prednisone and antibiotics.  She denies any shortness of breath at this point and states that the symptoms she had when she was first admitted last month were much more severe.   ? ?Past Medical History:  ?Diagnosis Date  ? Allergy   ? year around allergies  ? Arthritis   ? fingers, knees, neck, back-osteoarthristis  ? Bronchitis   ? hx of  ? Complication of anesthesia   ? 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 GERD (gastroesophageal reflux disease)   ? hx of reflux-went away with elevating HOB   ? Hypertension   ? Pneumonia   ? hx of   ? PONV (postoperative nausea and vomiting)   ? Type 2 diabetes mellitus ([place]) 08/02/2021  ? ? ?Past Surgical History:  ?Procedure Laterality Date  ? ABDOMINAL HYSTERECTOMY    ? ADENOIDECTOMY    ? BRONCHIAL BRUSHINGS  08/03/2021  ? Procedure: BRONCHIAL BRUSHINGS;  Surgeon: Collene Gobble, MD;  Location: [facility] ENDOSCOPY;  Service: Cardiopulmonary;;  ? BRONCHIAL WASHINGS  08/03/2021  ? Procedure: BRONCHIAL WASHINGS;  Surgeon: Collene Gobble, MD;  Location: [facility] ENDOSCOPY;  Service: Cardiopulmonary;;  ? cosmetic surgeries    ? ESOPHAGEAL MANOMETRY N/A  12/02/2015  ? Procedure: ESOPHAGEAL MANOMETRY (EM);  Surgeon: [name], MD;  Location: [facility] ENDOSCOPY;  Service: Endoscopy;  Laterality: N/A;  ? ESOPHAGOGASTRODUODENOSCOPY N/A 12/02/2015  ? Procedure: ESOPHAGOGASTRODUODENOSCOPY (EGD);  Surgeon: [name], MD;  Location: [facility] ENDOSCOPY;  Service: Endoscopy;  Laterality: N/A;  ? lymph node removal left leg    ? cat scratch surgery   ? THROAT SURGERY    ? sleep apnea surgery   ? TONSILLECTOMY    ? VIDEO BRONCHOSCOPY  08/03/2021  ? Procedure: VIDEO BRONCHOSCOPY WITH FLUORO;  Surgeon: Collene Gobble, MD;  Location: [facility] ENDOSCOPY;  Service: Cardiopulmonary;;  ? ? ? ?EXAMINATION: ?On examination she has moderate swelling about the right ankle.  Tenderness on the medial and lateral sides.  Skin is intact.  Intact sensation to the dorsal and plantar aspects of the foot.  Able to demonstrate intact toe flexion and extension.  On the left ankle she has mild about the left distal fibula.  There is no medial tenderness.  Able to demonstrate active ankle plantarflexion and dorsiflexion.  Sensation intact distally in the foot.  Feet are warm and well perfused.   ? ?X-RAYS: ?X-rays of the right ankle from Urgent Care were reviewed and demonstrate a right bimalleolar ankle fracture with mild displacement of both fractures.  Ankle remains well reduced.  ?Left ankle x-rays demonstrate no obvious ankle fracture, dislocation or other osseous abnormalities.   ? ?  IMPRESSION: ?Right bimalleolar ankle fracture. ? ?PLAN: ?Discussed with the patient that her x-rays demonstrate an unstable ankle fracture pattern.  Would recommend surgical treatment to address this fracture, as non-operative treatment would put her at increased risk for malunion, nonunion and posttraumatic arthritis.  I have concerns regarding her undergoing anesthesia given her recent pneumococcal pneumonia and respiratory issues, as well as her current use of high dose Prednisone.  Discussed the Prednisone would put her at  increased risk for wound healing problems and infection.  We will reach out to her pulmonologist regarding her safety to undergo anesthesia at this point, as well as the possibility of tapering from her steroid to a lower dose before surgery.  Since the fracture was just sustained last Thursday we can even wait to as long as the end of next week before doing surgery to allow her lungs time to recover and to be able to wean from the Prednisone if allowed by her pulmonologist.  The risks of surgery including hardware prominence, nonunion, malunion, nerve injury, bleeding, risk of needing revision surgery and posttraumatic arthritis were discussed.  Patient expressed understanding and agreement with the plan to proceed with right ankle open reduction internal fixation surgery.       ? ?Discussed her case with her pulmonologist Dr. Erin Fulling, who felt it would be safe to reduce her prednisone dose from '40mg'$  a day to '20mg'$  a day but reducing lower would compromise the treatment of her interstitial lung disease/pneumonitis. The patient denies feeling any new respiratory symptoms since reducing her prednisone dose this past week. ? ?

## 2021-09-03 NOTE — Anesthesia Procedure Notes (Signed)
Anesthesia Regional Block: Popliteal block  ? ?Pre-Anesthetic Checklist: , timeout performed,  Correct Patient, Correct Site, Correct Laterality,  Correct Procedure, Correct Position, site marked,  Risks and benefits discussed,  Surgical consent,  Pre-op evaluation,  At surgeon's request and post-op pain management ? ?Laterality: Right ? ?Prep: chloraprep     ?  ?Needles:  ?Injection technique: Single-shot ? ? ? ? ? ?Additional Needles: ? ? ?Procedures: Doppler guided,,,, ultrasound used (permanent image in chart),,    ? ?Nerve Stimulator or Paresthesia:  ?Response: 0.5 mA ? ?Additional Responses:  ? ?Narrative:  ?Start time: 09/03/2021 11:40 AM ?End time: 09/03/2021 11:55 AM ?Injection made incrementally with aspirations every 5 mL. ? ?Performed by: Other  ?Anesthesiologist: Belinda Block, MD ? ? ? ? ?

## 2021-09-03 NOTE — Interval H&P Note (Signed)
The patient has been re-examined, and the chart reviewed, and there have been no interval changes to the documented history and physical.   ? ?Plan for ORIF R bimal ankle fracture. ?Patient down to '20mg'$  prednisone per pumonary recs. Discussed that the steroid will put her at increased perip risk of complication including infection, wound healing problems, and nonunion. ?Plan to stay overnight for observation. ? ?Splint cut down in preop.   ?The operative side was examined and the patient was confirmed to have. Sens DPN, SPN, TN intact, Motor EHL, ext, flex 5/5, and DP 2+, PT 2+, No significant edema. ? ? ?The risks, benefits, and alternatives have been discussed at length with patient, and the patient is willing to proceed.  Right ankle. marked. Consent has been signed. ? ?

## 2021-09-03 NOTE — Op Note (Signed)
09/03/2021 ? ?PATIENT:  Meghan Alexander   ? ?PRE-OPERATIVE DIAGNOSIS: Right bimalleolar ankle fracture ? ?POST-OPERATIVE DIAGNOSIS:  Same ? ?PROCEDURE:  OPEN REDUCTION INTERNAL FIXATION (ORIF) ANKLE FRACTURE ? ?SURGEON:  Willaim Sheng, MD ? ?PHYSICIAN ASSISTANT: Izola Price, RNFA, present and scrubbed throughout the case, critical for completion in a timely fashion, and for retraction, instrumentation, and closure. ? ?ANESTHESIA:   General ? ?ESTIMATED BLOOD LOSS: 20cc ? ?PREOPERATIVE INDICATIONS:  Meghan Alexander is a  66 y.o. female with a diagnosis of Right bimalleolar ankle fracture who elected for surgical management to minimize the risk for malunion and nonunion and post-traumatic arthritis.   ? ?The risks benefits and alternatives were discussed with the patient preoperatively including but not limited to the risks of infection, bleeding, nerve injury, cardiopulmonary complications, the need for revision surgery, the need for hardware removal, among others, and the patient was willing to proceed. ? ?OPERATIVE IMPLANTS:  ?Implant Name Type Inv. Item Serial No. Manufacturer Lot No. LRB No. Used Action  ?SCREW NON-LOCKING 3.5X20 ANKLE - [id number] Screw SCREW NON-LOCKING 3.5X20 ANKLE  [employer]  Right 1 Implanted  ?PLATE 5HOLE LOCKING 64QIH - [id number] Plate PLATE 5HOLE LOCKING 87FIE  [employer]  Right 1 Implanted  ?SCREW COMP KREULOCK 2.7X14 - [id number] Screw SCREW COMP KREULOCK 2.7X14  [employer]  Right 3 Implanted  ?SCREW COMP KREULOCK 2.7X16 - [id number] Screw SCREW COMP KREULOCK 2.7X16  [employer]  Right 2 Implanted  ?SCREW LOW PROFILE 3.5X14 - [id number] Screw SCREW LOW PROFILE 3.5X14  [employer]  Right 2 Implanted  ?SCREW NON-LOCKING 3.5X12MM - [id number] Screw SCREW NON-LOCKING 3.5X12MM  [employer]  Right 1 Implanted  ?SCREW LOW PROFILE 4.0X40 - [id number] Screw SCREW LOW PROFILE 4.0X40  [employer]  Right 2 Implanted  ? ? ?OPERATIVE PROCEDURE: The patient was brought to the operating room and  placed in the supine position. All bony prominences were padded. Non sterile Tourniquet was placed but not inflated. General anesthesia was administered. The lower extremity was prepped and draped in the usual sterile fashion.  Time out was performed.  ? ?Incision was made over the distal fibula and the fracture was exposed and reduced anatomically with a clamp. A lag screw was placed. Dissection for the plate proximally was performed with metz to bluntly spread and to protect crossing branches of the superficial peroneal nerve. I then applied an appropriately sized distal fibular locking plate and secured it proximally with non locking screws and distally with locking screws. Bone quality was okay. I used c-arm to confirm satisfactory reduction and fixation.  ? ?I then turned my attention to the medial malleolus. Incision was made over the medial malleolus and the fracture exposed, cleaned, reduced, and held provisionally with a clamp.  Two  guide wires were placed placed for the 4.0 mm cannulated screw and then confirmation of reduction was made with fluoroscopy. I then placed the two 23m screw swhich had satisfactory fixation.  ? ?The syndesmosis was stressed using live fluoroscopy and found to be stable.  ? ?The wounds were irrigated, vancomycin powder was placed in the wounds, and closed with 0 vicryl, 2-0 vicryl.  3-0 nylon closure for the skin. Adaptic and Sterile gauze was applied followed by a posterior splint. She was awakened and returned to the PACU in stable and satisfactory condition. There were no complications. ? ?Post op recs: ?WB: NWB RLE in splint ?Imaging: PACU xrays ?Dressing: keep splint intact until follow up ?DVT prophylaxis: aspirin '81mg'$  BID x4 weeks ?  Follow up: 2 weeks after surgery for a wound check and suture removal with Dr. Zachery Dakins at [facility].  ?Address: [address] [place], [place], [place] [address]  ?Office Phone: [phone] ? ?Charlies Constable,  MD ?Orthopaedic Surgery ? ?  ?

## 2021-09-03 NOTE — Transfer of Care (Signed)
Immediate Anesthesia Transfer of Care Note ? ?Patient: Meghan Alexander ? ?Procedure(s) Performed: OPEN REDUCTION INTERNAL FIXATION (ORIF) ANKLE FRACTURE (Right: Ankle) ? ?Patient Location: PACU ? ?Anesthesia Type:GA combined with regional for post-op pain ? ?Level of Consciousness: awake and alert  ? ?Airway & Oxygen Therapy: Patient Spontanous Breathing ? ?Post-op Assessment: Report given to RN and Post -op Vital signs reviewed and stable ? ?Post vital signs: Reviewed and stable ? ?Last Vitals:  ?Vitals Value Taken Time  ?BP    ?Temp    ?Pulse    ?Resp    ?SpO2    ? ? ?Last Pain:  ?Vitals:  ? 09/03/21 1145  ?TempSrc:   ?PainSc: 0-No pain  ?   ? ?Patients Stated Pain Goal: 0 (09/03/21 0954) ? ?Complications: No notable events documented. ?

## 2021-09-03 NOTE — Discharge Instructions (Signed)
Diet: As you were doing prior to hospitalization  ? ?Shower:  May shower but keep the wounds dry, use an occlusive plastic wrap, NO SOAKING IN TUB.   If you have a splint on, leave the splint in place and keep the splint dry with a plastic bag. ? ?Dressing:  If you have a splint, then just leave the splint in place and we will change your bandages during your first follow-up appointment.  If water gets in the splint or the splint gets saturated please call the clinic and we can see you to change your splint. ? ? ?Activity:  Increase activity slowly as tolerated, but follow the weight bearing instructions below.  The rules on driving is that you can not be taking narcotics while you drive, and you must feel in control of the vehicle.   ? ?Weight Bearing:   Non weight bearing right leg in splint until follow up.   ? ?Blood clot prevention (DVT Prophylaxis): After surgery you are at an increased risk for a blood clot. you were prescribed a blood thinner, aspirin '81mg'$ , to be taken twice daily for a total of 4 weeks from surgery to help reduce your risk of getting a blood clot. This will help prevent a blood clot. Signs of a pulmonary embolus (blood clot in the lungs) include sudden short of breath, feeling lightheaded or dizzy, chest pain with a deep breath, rapid pulse rapid breathing. Signs of a blood clot in your arms or legs include new unexplained swelling and cramping, warm, red or darkened skin around the painful area. Please call the office or 911 right away if these signs or symptoms develop. ?To prevent constipation: you may use a stool softener such as - ? ?Colace (over the counter) 100 mg by mouth twice a day  ?Drink plenty of fluids (prune juice may be helpful) and high fiber foods ?Miralax (over the counter) for constipation as needed.   ? ?Itching:  If you experience itching with your medications, try taking only a single pain pill, or even half a pain pill at a time.  You may take up to 10 pain pills per  day, and you can also use benadryl over the counter for itching or also to help with sleep.  ? ?Precautions:  If you experience chest pain or shortness of breath - call 911 immediately for transfer to the hospital emergency department!! ? ? ?Call office [phone]) for the following: ?Temperature greater than 101F ?Persistent nausea and vomiting ?Severe uncontrolled pain ?Redness, tenderness, or signs of infection (pain, swelling, redness, odor or green/yellow discharge around the site) ?Difficulty breathing, headache or visual disturbances ?Hives ?Persistent dizziness or light-headedness ?Extreme fatigue ?Any other questions or concerns you may have after discharge ? ?In an emergency, call 911 or go to an Emergency Department at a nearby hospital ? ?Follow- Up Appointment:  Please call for an appointment to be seen approximately 2-3 week after surgery in [facility] with your surgeon Dr. Charlies Constable - [phone] ?Address: [address] [place], [place], [place] [address] ? ? ?  ?

## 2021-09-03 NOTE — Anesthesia Procedure Notes (Signed)
Procedure Name: LMA Insertion ?Date/Time: 09/03/2021 11:56 AM ?Performed by: Terrence Dupont, CRNA ?Pre-anesthesia Checklist: Patient identified, Emergency Drugs available, Suction available and Patient being monitored ?Patient Re-evaluated:Patient Re-evaluated prior to induction ?Oxygen Delivery Method: Circle system utilized ?Preoxygenation: Pre-oxygenation with 100% oxygen ?Induction Type: IV induction ?Ventilation: Mask ventilation without difficulty ?LMA: LMA inserted ?LMA Size: 4.0 ?Tube type: Oral ?Number of attempts: 1 ?Airway Equipment and Method: Stylet and Oral airway ?Placement Confirmation: ETT inserted through vocal cords under direct vision, positive ETCO2 and breath sounds checked- equal and bilateral ?Tube secured with: Tape ?Dental Injury: Teeth and Oropharynx as per pre-operative assessment  ? ? ? ? ?

## 2021-09-03 NOTE — Progress Notes (Signed)
? ? ? ?  Subjective: ? ?Patient seen in PACU. Reports pain well controlled. Denies distal n/t. Foot elevated in pillows. Questions regarding surgery answered. Plan to stay overnight for observation given patient's underlying interstitial lung disease and pneumonitis undergoing treatment per pulmonology. ? ?Objective:  ? ?VITALS:   ?Vitals:  ? 09/03/21 1715 09/03/21 1747 09/03/21 1812 09/03/21 1929  ?BP: (!) 155/81 (!) 150/85 (!) 156/91 140/75  ?Pulse: 73 77 76 91  ?Resp: '19 17 18 18  '$ ?Temp:  98 ?F (36.7 ?C) 97.8 ?F (36.6 ?C) 98.2 ?F (36.8 ?C)  ?TempSrc:    Oral  ?SpO2: 98% 95% 93% 94%  ?Weight:      ?Height:      ? ? ?Sensation intact distally ?Dorsiflexion/Plantar flexion intact ?Incision: dressing C/D/I ?Compartment soft ? ? ?Lab Results  ?Component Value Date  ? WBC 9.9 08/17/2021  ? HGB 10.2 (L) 08/17/2021  ? HCT 32.9 (L) 08/17/2021  ? MCV 82.0 08/17/2021  ? PLT 278 08/17/2021  ? ?BMET ?   ?Component Value Date/Time  ? NA 137 08/17/2021 1315  ? K 3.1 (L) 08/17/2021 1315  ? CL 98 08/17/2021 1315  ? CO2 29 08/17/2021 1315  ? GLUCOSE 236 (H) 08/17/2021 1315  ? BUN 9 08/17/2021 1315  ? CREATININE 0.76 08/17/2021 1315  ? CALCIUM 9.1 08/17/2021 1315  ? GFRNONAA >60 08/17/2021 1315  ? ? ? ? ?Xray: PACU xrays show good alignment of ankle following ORIF without adverse hardware features. ? ?Assessment/Plan: ?Day of Surgery  ? ?Principal Problem: ?  Closed right ankle fracture ? ?S/p R bimal fx ORIF 09/02/21 ? ?Post op recs: ?WB: NWB RLE in splint ?Imaging: PACU xrays ?Dressing: keep splint intact until follow up ?DVT prophylaxis: aspirin '81mg'$  BID x4 weeks ?Follow up: 2 weeks after surgery for a wound check and suture removal with Dr. Zachery Dakins at [facility].  ?Address: [address] [place], [place], [place] [address]  ?Office Phone: [phone] ?  ? ? ? ?Meghan Alexander ?09/03/2021, 9:39 PM ? ? ?Charlies Constable, MD ? ?Contact information:   ?Weekdays 7am-5pm epic message Dr. Zachery Dakins, or call  office for patient follow up: [phone] ?After hours and holidays please check [email or web address] for group call information for [facility] ? ?  ?

## 2021-09-03 NOTE — Anesthesia Preprocedure Evaluation (Signed)
Anesthesia Evaluation  ?Patient identified by MRN, date of birth, ID band ?Patient awake ? ? ? ?History of Anesthesia Complications ?(+) PONV and history of anesthetic complications ? ?Airway ?Mallampati: II ? ?TM Distance: >3 FB ? ? ? ? Dental ?  ?Pulmonary ?shortness of breath, pneumonia,  ?  ?breath sounds clear to auscultation ? ? ? ? ? ? Cardiovascular ?hypertension,  ?Rhythm:Regular Rate:Normal ? ? ?  ?Neuro/Psych ?  ? GI/Hepatic ?Neg liver ROS, GERD  ,  ?Endo/Other  ?diabetes ? Renal/GU ?negative Renal ROS  ? ?  ?Musculoskeletal ? ?(+) Arthritis ,  ? Abdominal ?  ?Peds ? Hematology ?  ?Anesthesia Other Findings ? ? Reproductive/Obstetrics ? ?  ? ? ? ? ? ? ? ? ? ? ? ? ? ?  ?  ? ? ? ? ? ? ? ? ?Anesthesia Physical ?Anesthesia Plan ? ?ASA: 3 ? ?Anesthesia Plan: General  ? ?Post-op Pain Management: Regional block*  ? ?Induction:  ? ?PONV Risk Score and Plan: 4 or greater and Ondansetron, Dexamethasone and Midazolam ? ?Airway Management Planned: Oral ETT ? ?Additional Equipment:  ? ?Intra-op Plan:  ? ?Post-operative Plan: Extubation in OR ? ?Informed Consent: I have reviewed the patients History and Physical, chart, labs and discussed the procedure including the risks, benefits and alternatives for the proposed anesthesia with the patient or authorized representative who has indicated his/her understanding and acceptance.  ? ? ? ?Dental advisory given ? ?Plan Discussed with: CRNA and Anesthesiologist ? ?Anesthesia Plan Comments:   ? ? ? ? ? ? ?Anesthesia Quick Evaluation ? ?

## 2021-09-04 ENCOUNTER — Other Ambulatory Visit: Payer: Self-pay

## 2021-09-04 DIAGNOSIS — U071 COVID-19: Secondary | ICD-10-CM | POA: Diagnosis not present

## 2021-09-04 DIAGNOSIS — R0602 Shortness of breath: Secondary | ICD-10-CM | POA: Diagnosis not present

## 2021-09-04 LAB — BASIC METABOLIC PANEL
Anion gap: 11 (ref 5–15)
BUN: 15 mg/dL (ref 8–23)
CO2: 28 mmol/L (ref 22–32)
Calcium: 8.3 mg/dL — ABNORMAL LOW (ref 8.9–10.3)
Chloride: 99 mmol/L (ref 98–111)
Creatinine, Ser: 0.94 mg/dL (ref 0.44–1.00)
GFR, Estimated: >60 mL/min (ref >60)
Glucose, Bld: 258 mg/dL — ABNORMAL HIGH (ref 70–99)
Potassium: 4.1 mmol/L (ref 3.5–5.1)
Sodium: 138 mmol/L (ref 135–145)

## 2021-09-04 LAB — CBC
HCT: 28 % — ABNORMAL LOW (ref 36.0–46.0)
Hemoglobin: 8.6 g/dL — ABNORMAL LOW (ref 12.0–15.0)
MCH: 25.1 pg — ABNORMAL LOW (ref 26.0–34.0)
MCHC: 30.7 g/dL (ref 30.0–36.0)
MCV: 81.9 fL (ref 80.0–100.0)
Platelets: 257 10*3/uL (ref 150–400)
RBC: 3.42 MIL/uL — ABNORMAL LOW (ref 3.87–5.11)
RDW: 15.7 % — ABNORMAL HIGH (ref 11.5–15.5)
WBC: 8.4 10*3/uL (ref 4.0–10.5)
nRBC: 0 % (ref 0.0–0.2)

## 2021-09-04 LAB — GLUCOSE, CAPILLARY: Glucose-Capillary: 250 mg/dL — ABNORMAL HIGH (ref 70–99)

## 2021-09-04 NOTE — Discharge Summary (Signed)
? ?Orthopaedic Trauma Service (OTS) Discharge Summary  ? ?Patient ID: ?Meghan Alexander ?MRN: [id number] ?DOB/AGE: 68/09/1955 68 y.o. ? ?Admit date: 09/03/2021 ?Discharge date: 09/04/2021 ? ?Admission Diagnoses: Right ankle fracture ? ?Discharge Diagnoses:  ?Principal Problem: ?  Closed right ankle fracture ? ? ?Past Medical History:  ?Diagnosis Date  ? Allergy   ? year around allergies  ? Arthritis   ? fingers, knees, neck, back-osteoarthristis  ? Bronchitis   ? hx of  ? Complication of anesthesia   ? 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 GERD (gastroesophageal reflux disease)   ? hx of reflux-went away with elevating HOB   ? Hypertension   ? Pneumonia   ? hx of   ? PONV (postoperative nausea and vomiting)   ? Type 2 diabetes mellitus ([place]) 08/02/2021  ? ? ? ?Procedures Performed: ORIF right ankle ? ?Discharged Condition: good ? ?Hospital Course: Patient presented to [facility] on 09/03/2021 for scheduled procedure on right lower extremity.  Patient was taken to the operating room by Dr. Zachery Dakins for the above procedure and tolerated this well without complications.  Patient was admitted for observation overnight given her underlying interstitial lung disease and pneumonitis.  Patient was evaluated by physical therapy on postoperative day #1, no additional physical therapy follow-up recommended at this time.  On 09/04/2021, the patient was tolerating diet, working well with therapies, pain well controlled, vital signs stable, dressings clean, dry, intact and felt stable for discharge to home. Patient will follow up as below and knows to call with questions or concerns.  ?  ? ?Consults: None ? ?Significant Diagnostic Studies:  ? ?Results for orders placed or performed during the hospital encounter of 09/03/21 (from the past 168 hour(s))  ?Glucose, capillary  ? Collection Time: 09/03/21  9:45 AM  ?Result Value Ref Range  ? Glucose-Capillary 179 (H) 70 - 99 mg/dL  ?Surgical  pcr screen  ? Collection Time: 09/03/21 10:00 AM  ? Specimen: Nasal Mucosa; Nasal Swab  ?Result Value Ref Range  ? MRSA, PCR NEGATIVE NEGATIVE  ? Staphylococcus aureus NEGATIVE NEGATIVE  ?Glucose, capillary  ? Collection Time: 09/03/21 12:30 PM  ?Result Value Ref Range  ? Glucose-Capillary 214 (H) 70 - 99 mg/dL  ?Glucose, capillary  ? Collection Time: 09/03/21  1:55 PM  ?Result Value Ref Range  ? Glucose-Capillary 224 (H) 70 - 99 mg/dL  ? Comment 1 Notify RN   ?Glucose, capillary  ? Collection Time: 09/03/21  5:47 PM  ?Result Value Ref Range  ? Glucose-Capillary 193 (H) 70 - 99 mg/dL  ?Glucose, capillary  ? Collection Time: 09/03/21  9:09 PM  ?Result Value Ref Range  ? Glucose-Capillary 346 (H) 70 - 99 mg/dL  ? Comment 1 Notify RN   ? Comment 2 Document in Chart   ?Glucose, capillary  ? Collection Time: 09/04/21  6:07 AM  ?Result Value Ref Range  ? Glucose-Capillary 250 (H) 70 - 99 mg/dL  ? Comment 1 Notify RN   ? Comment 2 Document in Chart   ?Basic metabolic panel  ? Collection Time: 09/04/21  6:28 AM  ?Result Value Ref Range  ? Sodium 138 135 - 145 mmol/L  ? Potassium 4.1 3.5 - 5.1 mmol/L  ? Chloride 99 98 - 111 mmol/L  ? CO2 28 22 - 32 mmol/L  ? Glucose, Bld 258 (H) 70 - 99 mg/dL  ? BUN 15 8 - 23 mg/dL  ? Creatinine, Ser 0.94 0.44 - 1.00  mg/dL  ? Calcium 8.3 (L) 8.9 - 10.3 mg/dL  ? GFR, Estimated >60 >60 mL/min  ? Anion gap 11 5 - 15  ?CBC  ? Collection Time: 09/04/21  6:28 AM  ?Result Value Ref Range  ? WBC 8.4 4.0 - 10.5 K/uL  ? RBC 3.42 (L) 3.87 - 5.11 MIL/uL  ? Hemoglobin 8.6 (L) 12.0 - 15.0 g/dL  ? HCT 28.0 (L) 36.0 - 46.0 %  ? MCV 81.9 80.0 - 100.0 fL  ? MCH 25.1 (L) 26.0 - 34.0 pg  ? MCHC 30.7 30.0 - 36.0 g/dL  ? RDW 15.7 (H) 11.5 - 15.5 %  ? Platelets 257 150 - 400 K/uL  ? nRBC 0.0 0.0 - 0.2 %  ? ? ? ?Treatments: IV hydration, antibiotics: Ancef, analgesia: acetaminophen and tramadol, anticoagulation: ASA, insulin: regular, therapies: PT, and surgery: As above ? ?Discharge Exam: ?General: Patient sitting  up in bed, no acute distress ?Respiratory: No increased work of breathing at rest ?Right lower extremity: Well-padded, well fitting short leg splint in place.  Nontender above splint.  Endorses sensation of light touch over the toes.  Able to wiggle the toes on her own.  Foot warm, brisk cap refill ? ?Disposition: Discharge disposition: 01-Home or Self Care ? ? ? ? ? ? ? ?Allergies as of 09/04/2021   ? ?   Reactions  ? Latex Other (See Comments)  ? Blisters / skin peeling  ? Codeine Nausea And Vomiting, Other (See Comments)  ? hallucinations  ? Atenolol Cough  ? Other Other (See Comments)  ? DermaPlast? Used after surgery - caused swelling, itching, and wound broke open  ? Hydrocodone Nausea And Vomiting  ? Oxycodone Nausea And Vomiting  ? ?  ? ?  ?Medication List  ?  ? ?TAKE these medications   ? ?Accu-Chek Guide test strip ?Generic drug: glucose blood ?Use to test blood sugars up to 4 times daily as directed. ?  ?Accu-Chek Guide w/Device Kit ?Use to test blood sugars up to 4 times daily. ?  ?Accu-Chek Softclix Lancets lancets ?Use to test blood sugars up to 4 times daily as directed. ?  ?acetaminophen 500 MG tablet ?Commonly known as: TYLENOL ?Take 2 tablets (1,000 mg total) by mouth every 8 (eight) hours as needed. ?  ?acyclovir 400 MG tablet ?Commonly known as: ZOVIRAX ?TAKE 1 TABLET(400 MG) BY MOUTH DAILY ?What changed: See the new instructions. ?  ?albuterol 108 (90 Base) MCG/ACT inhaler ?Commonly known as: VENTOLIN HFA ?Inhale 2 puffs into the lungs daily as needed for shortness of breath. ?  ?aspirin EC 81 MG tablet ?Take 1 tablet (81 mg total) by mouth 2 (two) times daily for 28 days. Swallow whole. ?  ?Combivent Respimat 20-100 MCG/ACT Aers respimat ?Generic drug: Ipratropium-Albuterol ?Inhale 1 puff into the lungs every 6 (six) hours as needed for wheezing. ?  ?diphenhydrAMINE 25 MG tablet ?Commonly known as: BENADRYL ?Take 25 mg by mouth at bedtime. ?  ?guaiFENesin 100 MG/5ML liquid ?Commonly known as:  ROBITUSSIN ?Take 5 mLs by mouth every 4 (four) hours as needed for cough or to loosen phlegm. ?  ?insulin lispro 100 UNIT/ML injection ?Commonly known as: HUMALOG ?Inject 10 Units into the skin 3 (three) times daily before meals. ?  ?ipratropium 0.03 % nasal spray ?Commonly known as: ATROVENT ?Place 1 spray into both nostrils daily. ?  ?Levemir FlexPen 100 UNIT/ML FlexPen ?Generic drug: insulin detemir ?Inject 12 Units into the skin daily. ?What changed: how much to take ?  ?  losartan 50 MG tablet ?Commonly known as: COZAAR ?Take 50 mg by mouth daily. ?  ?Melatonin 10 MG Caps ?Take 10 mg by mouth at bedtime. ?  ?meloxicam 15 MG tablet ?Commonly known as: MOBIC ?Take 15 mg by mouth daily as needed for pain. ?  ?metFORMIN 500 MG tablet ?Commonly known as: GLUCOPHAGE ?Take 500 mg by mouth daily. ?  ?metoprolol succinate 50 MG 24 hr tablet ?Commonly known as: TOPROL-XL ?Take 50 mg by mouth daily. ?  ?multivitamin tablet ?Take 1 tablet by mouth daily. ?  ?omeprazole 20 MG capsule ?Commonly known as: PRILOSEC ?Take 20 mg by mouth 2 (two) times daily. ?  ?ondansetron 4 MG tablet ?Commonly known as: Zofran ?Take 1 tablet (4 mg total) by mouth every 8 (eight) hours as needed for up to 14 days for nausea or vomiting. ?  ?Pentips 32G X 4 MM Misc ?Generic drug: Insulin Pen Needle ?Use to inject insulin up to 4 times daily as directed. ?  ?phenol 1.4 % Liqd ?Commonly known as: CHLORASEPTIC ?Use as directed 1 spray in the mouth or throat as needed for throat irritation / pain. ?  ?predniSONE 20 MG tablet ?Commonly known as: DELTASONE ?Take 1 tablet (20 mg total) by mouth daily with breakfast. ?What changed: how much to take ?  ?rosuvastatin 10 MG tablet ?Commonly known as: CRESTOR ?Take 10 mg by mouth daily. ?  ?sulfamethoxazole-trimethoprim 800-160 MG tablet ?Commonly known as: BACTRIM DS ?Take 1 tablet by mouth 3 (three) times a week. ?  ?traMADol 50 MG tablet ?Commonly known as: Ultram ?Take 1-2 tablets (50-100 mg total) by  mouth every 6 (six) hours as needed for up to 7 days. ?  ?venlafaxine XR 37.5 MG 24 hr capsule ?Commonly known as: EFFEXOR-XR ?Take 37.5 mg by mouth daily with breakfast. ?  ?VITAMIN B-3 PO ?Take 1 tablet

## 2021-09-04 NOTE — Progress Notes (Signed)
Patient is discharged from room 3C04 at this time. Alert and in stable condition. IV site d/c'd and instructions read to patient and spouse with understanding verbalized and all questions answered. Left unit via wheelchair with all belongings at side. 

## 2021-09-04 NOTE — Evaluation (Signed)
Physical Therapy Evaluation & Discharge ?Patient Details ?Name: Meghan Alexander ?MRN: [id number] ?DOB: [birth year] ?Today's Date: 09/04/2021 ? ?History of Present Illness ? 66 y/o female admitted on 09/03/21 following R ankle ORIF after sustaining fx from fall on 4/29. PMH: T2DM, HTN, pneumococcal PNA  ?Clinical Impression ? Patient admitted following the above procedure. Educated patient on NWB status on R LE, patient demonstrated understanding. Patient able to mobilize at modI level with use of RW. Patient declined stair training as she has been managing at home with crutches and maintaining NWB and does not feel like she needs to practice. Instructed patient on SLR, hip abduction/adduction, and hip flexion to maintain strength in R hip and knee. No further skilled PT needs identified acutely. No PT follow up recommended at this time, however would recommend OPPT once able to bear weight through R LE.    ?   ? ?Recommendations for follow up therapy are one component of a multi-disciplinary discharge planning process, led by the attending physician.  Recommendations may be updated based on patient status, additional functional criteria and insurance authorization. ? ?Follow Up Recommendations No PT follow up (OPPT once able to bear weight on R LE) ? ?  ?Assistance Recommended at Discharge Intermittent Supervision/Assistance  ?Patient can return home with the following ?   ? ?  ?Equipment Recommendations Rolling Meghan Alexander (2 wheels)  ?Recommendations for Other Services ?    ?  ?Functional Status Assessment Patient has had a recent decline in their functional status and demonstrates the ability to make significant improvements in function in a reasonable and predictable amount of time.  ? ?  ?Precautions / Restrictions Precautions ?Precautions: Fall ?Restrictions ?Weight Bearing Restrictions: Yes ?RLE Weight Bearing: Non weight bearing  ? ?  ? ?Mobility ? Bed Mobility ?Overal bed mobility: Modified Independent ?  ?  ?  ?  ?  ?   ?  ?  ? ?Transfers ?Overall transfer level: Modified independent ?Equipment used: Rolling Meghan Alexander (2 wheels) ?  ?  ?  ?  ?  ?  ?  ?General transfer comment: able to maintain NWB to stand ?  ? ?Ambulation/Gait ?Ambulation/Gait assistance: Modified independent (Device/Increase time) ?Gait Distance (Feet): 10 Feet ?Assistive device: Rolling Meghan Alexander (2 wheels) ?Gait Pattern/deviations:  (hop to) ?  ?  ?  ?General Gait Details: good ability to maintain NWB on R LE ? ?Stairs ?Stairs:  (patient declines stair training. States she has been doing them with crutches and not putting weight through her R LE) ?  ?  ?  ?  ? ?Wheelchair Mobility ?  ? ?Modified Rankin (Stroke Patients Only) ?  ? ?  ? ?Balance Overall balance assessment: Needs assistance ?Sitting-balance support: No upper extremity supported, Feet supported ?Sitting balance-[name]: Good ?  ?  ?Standing balance support: Bilateral upper extremity supported, Reliant on assistive device for balance ?Standing balance-[name]: Poor ?Standing balance comment: reliant on RW to maintain NWB ?  ?  ?  ?  ?  ?  ?  ?  ?  ?  ?  ?   ? ? ? ?Pertinent Vitals/Pain Pain Assessment ?Pain Assessment: Faces ?Faces Pain Scale: Hurts little more ?Pain Location: R ankle ?Pain Descriptors / Indicators: Grimacing, Discomfort ?Pain Intervention(s): Monitored during session, Repositioned  ? ? ?Home Living Family/patient expects to be discharged to:: Private residence ?Living Arrangements: Spouse/significant other ?Available Help at Discharge: Family;Available 24 hours/day ?Type of Home: House ?Home Access: Stairs to enter ?  ?Entrance Stairs-Number of Steps: 2 ?  ?  Home Layout: Two level;Able to live on main level with bedroom/bathroom ?Home Equipment: Rollator (4 wheels);Crutches ?   ?  ?Prior Function Prior Level of Function : Independent/Modified Independent ?  ?  ?  ?  ?  ?  ?Mobility Comments: independent prior to fx. Recently using rollator as "wheelchair" to get around house. Has  crutches that she uses to navigate stairs ?  ?  ? ? ?Hand Dominance  ?   ? ?  ?Extremity/Trunk Assessment  ? Upper Extremity Assessment ?Upper Extremity Assessment: Overall WFL for tasks assessed ?  ? ?Lower Extremity Assessment ?Lower Extremity Assessment: RLE deficits/detail ?RLE Deficits / Details: 4/5 strength above knee ?RLE: Unable to fully assess due to immobilization ?  ? ?Cervical / Trunk Assessment ?Cervical / Trunk Assessment: Normal  ?Communication  ? Communication: No difficulties  ?Cognition Arousal/Alertness: Awake/alert ?Behavior During Therapy: [name] for tasks assessed/performed ?Overall Cognitive Status: Within Functional Limits for tasks assessed ?  ?  ?  ?  ?  ?  ?  ?  ?  ?  ?  ?  ?  ?  ?  ?  ?  ?  ?  ? ?  ?General Comments   ? ?  ?Exercises Other Exercises ?Other Exercises: instructed on hip abduction/adduction, hip flexion, SLR  ? ?Assessment/Plan  ?  ?PT Assessment Patient does not need any further PT services  ?PT Problem List   ? ?   ?  ?PT Treatment Interventions     ? ?PT Goals (Current goals can be found in the Care Plan section)  ?Acute Rehab PT Goals ?Patient Stated Goal: to go home ?PT Goal Formulation: All assessment and education complete, DC therapy ? ?  ?Frequency   ?  ? ? ?Co-evaluation   ?  ?  ?  ?  ? ? ?  ?AM-PAC PT "6 Clicks" Mobility  ?Outcome Measure Help needed turning from your back to your side while in a flat bed without using bedrails?: None ?Help needed moving from lying on your back to sitting on the side of a flat bed without using bedrails?: None ?Help needed moving to and from a bed to a chair (including a wheelchair)?: None ?Help needed standing up from a chair using your arms (e.g., wheelchair or bedside chair)?: None ?Help needed to walk in hospital room?: None ?Help needed climbing 3-5 steps with a railing? : A Little ?6 Click Score: 23 ? ?  ?End of Session   ?Activity Tolerance: Patient tolerated treatment well ?Patient left: in bed;with call bell/phone within  reach ?Nurse Communication: Mobility status ?PT Visit Diagnosis: Muscle weakness (generalized) (M62.81);History of falling (Z91.81) ?  ? ?Time: 0812-0830 ?PT Time Calculation (min) (ACUTE ONLY): 18 min ? ? ?Charges:   PT Evaluation ?$PT Eval Low Complexity: 1 Low ?  ?  ?   ? ? ?Taitum Menton A. Gilford Rile, PT, DPT ?Acute Rehabilitation Services ?Pager [phone] ?Office [phone] ? ? ?Lamees Gable A Franco Duley ?09/04/2021, 9:57 AM ? ?

## 2021-09-06 ENCOUNTER — Inpatient Hospital Stay (HOSPITAL_COMMUNITY)
Admission: EM | Admit: 2021-09-06 | Discharge: 2021-09-08 | DRG: 177 | Disposition: A | Discharge status: Home / Self Care | Payer: Medicare Other | Attending: Family Medicine | Admitting: Family Medicine

## 2021-09-06 ENCOUNTER — Other Ambulatory Visit: Payer: Self-pay

## 2021-09-06 ENCOUNTER — Encounter (HOSPITAL_COMMUNITY): Payer: Self-pay

## 2021-09-06 ENCOUNTER — Emergency Department (HOSPITAL_COMMUNITY): Payer: Medicare Other

## 2021-09-06 DIAGNOSIS — Z801 Family history of malignant neoplasm of trachea, bronchus and lung: Secondary | ICD-10-CM

## 2021-09-06 DIAGNOSIS — M19041 Primary osteoarthritis, right hand: Secondary | ICD-10-CM | POA: Diagnosis present

## 2021-09-06 DIAGNOSIS — Z79899 Other long term (current) drug therapy: Secondary | ICD-10-CM

## 2021-09-06 DIAGNOSIS — Z91040 Latex allergy status: Secondary | ICD-10-CM

## 2021-09-06 DIAGNOSIS — S82841A Displaced bimalleolar fracture of right lower leg, initial encounter for closed fracture: Secondary | ICD-10-CM | POA: Diagnosis present

## 2021-09-06 DIAGNOSIS — K219 Gastro-esophageal reflux disease without esophagitis: Secondary | ICD-10-CM | POA: Diagnosis present

## 2021-09-06 DIAGNOSIS — J849 Interstitial pulmonary disease, unspecified: Secondary | ICD-10-CM | POA: Diagnosis present

## 2021-09-06 DIAGNOSIS — T451X5A Adverse effect of antineoplastic and immunosuppressive drugs, initial encounter: Secondary | ICD-10-CM | POA: Diagnosis present

## 2021-09-06 DIAGNOSIS — E1165 Type 2 diabetes mellitus with hyperglycemia: Secondary | ICD-10-CM | POA: Diagnosis not present

## 2021-09-06 DIAGNOSIS — Z7984 Long term (current) use of oral hypoglycemic drugs: Secondary | ICD-10-CM

## 2021-09-06 DIAGNOSIS — C8218 Follicular lymphoma grade II, lymph nodes of multiple sites: Secondary | ICD-10-CM | POA: Diagnosis present

## 2021-09-06 DIAGNOSIS — J9601 Acute respiratory failure with hypoxia: Secondary | ICD-10-CM | POA: Diagnosis present

## 2021-09-06 DIAGNOSIS — M479 Spondylosis, unspecified: Secondary | ICD-10-CM | POA: Diagnosis present

## 2021-09-06 DIAGNOSIS — C8590 Non-Hodgkin lymphoma, unspecified, unspecified site: Secondary | ICD-10-CM

## 2021-09-06 DIAGNOSIS — Z7982 Long term (current) use of aspirin: Secondary | ICD-10-CM

## 2021-09-06 DIAGNOSIS — M19042 Primary osteoarthritis, left hand: Secondary | ICD-10-CM | POA: Diagnosis present

## 2021-09-06 DIAGNOSIS — M170 Bilateral primary osteoarthritis of knee: Secondary | ICD-10-CM | POA: Diagnosis present

## 2021-09-06 DIAGNOSIS — N390 Urinary tract infection, site not specified: Secondary | ICD-10-CM | POA: Diagnosis present

## 2021-09-06 DIAGNOSIS — Z794 Long term (current) use of insulin: Secondary | ICD-10-CM

## 2021-09-06 DIAGNOSIS — D649 Anemia, unspecified: Secondary | ICD-10-CM | POA: Diagnosis present

## 2021-09-06 DIAGNOSIS — S82891D Other fracture of right lower leg, subsequent encounter for closed fracture with routine healing: Secondary | ICD-10-CM

## 2021-09-06 DIAGNOSIS — Z803 Family history of malignant neoplasm of breast: Secondary | ICD-10-CM

## 2021-09-06 DIAGNOSIS — I10 Essential (primary) hypertension: Secondary | ICD-10-CM | POA: Diagnosis present

## 2021-09-06 DIAGNOSIS — W1789XD Other fall from one level to another, subsequent encounter: Secondary | ICD-10-CM

## 2021-09-06 DIAGNOSIS — J679 Hypersensitivity pneumonitis due to unspecified organic dust: Secondary | ICD-10-CM | POA: Diagnosis present

## 2021-09-06 DIAGNOSIS — R0902 Hypoxemia: Secondary | ICD-10-CM | POA: Diagnosis not present

## 2021-09-06 DIAGNOSIS — T380X5A Adverse effect of glucocorticoids and synthetic analogues, initial encounter: Secondary | ICD-10-CM | POA: Diagnosis not present

## 2021-09-06 DIAGNOSIS — E119 Type 2 diabetes mellitus without complications: Secondary | ICD-10-CM | POA: Diagnosis present

## 2021-09-06 DIAGNOSIS — U071 COVID-19: Principal | ICD-10-CM | POA: Diagnosis present

## 2021-09-06 DIAGNOSIS — E876 Hypokalemia: Secondary | ICD-10-CM | POA: Diagnosis present

## 2021-09-06 DIAGNOSIS — Z885 Allergy status to narcotic agent status: Secondary | ICD-10-CM

## 2021-09-06 LAB — CBC WITH DIFFERENTIAL/PLATELET
Abs Immature Granulocytes: 0.38 10*3/uL — ABNORMAL HIGH (ref 0.00–0.07)
Basophils Absolute: 0 10*3/uL (ref 0.0–0.1)
Basophils Relative: 0 %
Eosinophils Absolute: 0 10*3/uL (ref 0.0–0.5)
Eosinophils Relative: 1 %
HCT: 27.8 % — ABNORMAL LOW (ref 36.0–46.0)
Hemoglobin: 8.4 g/dL — ABNORMAL LOW (ref 12.0–15.0)
Immature Granulocytes: 5 %
Lymphocytes Relative: 3 %
Lymphs Abs: 0.2 10*3/uL — ABNORMAL LOW (ref 0.7–4.0)
MCH: 25.2 pg — ABNORMAL LOW (ref 26.0–34.0)
MCHC: 30.2 g/dL (ref 30.0–36.0)
MCV: 83.5 fL (ref 80.0–100.0)
Monocytes Absolute: 0.4 10*3/uL (ref 0.1–1.0)
Monocytes Relative: 5 %
Neutro Abs: 7 10*3/uL (ref 1.7–7.7)
Neutrophils Relative %: 86 %
Platelets: 292 10*3/uL (ref 150–400)
RBC: 3.33 MIL/uL — ABNORMAL LOW (ref 3.87–5.11)
RDW: 16.1 % — ABNORMAL HIGH (ref 11.5–15.5)
WBC: 8.1 10*3/uL (ref 4.0–10.5)
nRBC: 0.5 % — ABNORMAL HIGH (ref 0.0–0.2)

## 2021-09-06 LAB — RESPIRATORY PANEL BY PCR

## 2021-09-06 LAB — COMPREHENSIVE METABOLIC PANEL
ALT: 20 U/L (ref 0–44)
AST: 22 U/L (ref 15–41)
Albumin: 2.6 g/dL — ABNORMAL LOW (ref 3.5–5.0)
Alkaline Phosphatase: 73 U/L (ref 38–126)
Anion gap: 12 (ref 5–15)
BUN: 12 mg/dL (ref 8–23)
CO2: 31 mmol/L (ref 22–32)
Calcium: 8.5 mg/dL — ABNORMAL LOW (ref 8.9–10.3)
Chloride: 97 mmol/L — ABNORMAL LOW (ref 98–111)
Creatinine, Ser: 0.85 mg/dL (ref 0.44–1.00)
GFR, Estimated: >60 mL/min (ref >60)
Glucose, Bld: 113 mg/dL — ABNORMAL HIGH (ref 70–99)
Potassium: 3.2 mmol/L — ABNORMAL LOW (ref 3.5–5.1)
Sodium: 140 mmol/L (ref 135–145)
Total Bilirubin: 0.3 mg/dL (ref 0.3–1.2)
Total Protein: 5.4 g/dL — ABNORMAL LOW (ref 6.5–8.1)

## 2021-09-06 LAB — GLUCOSE, CAPILLARY
Glucose-Capillary: 190 mg/dL — ABNORMAL HIGH (ref 70–99)
Glucose-Capillary: 172 mg/dL — ABNORMAL HIGH (ref 70–99)

## 2021-09-06 LAB — RESP PANEL BY RT-PCR (FLU A&B, COVID) ARPGX2
Influenza A by PCR: NEGATIVE
Influenza B by PCR: NEGATIVE
SARS Coronavirus 2 by RT PCR: POSITIVE — AB

## 2021-09-06 LAB — PROCALCITONIN: Procalcitonin: <0.1 ng/mL

## 2021-09-06 LAB — LACTIC ACID, PLASMA: Lactic Acid, Venous: 1.2 mmol/L (ref 0.5–1.9)

## 2021-09-06 LAB — SEDIMENTATION RATE: Sed Rate: 80 mm/hr — ABNORMAL HIGH (ref 0–22)

## 2021-09-06 LAB — MAGNESIUM: Magnesium: 1.8 mg/dL (ref 1.7–2.4)

## 2021-09-06 MED ORDER — ASPIRIN EC 81 MG PO TBEC: 81.0000 mg | DELAYED_RELEASE_TABLET | Freq: Two times a day (BID) | ORAL | Status: DC

## 2021-09-06 MED ORDER — INSULIN GLARGINE-YFGN 100 UNIT/ML ~~LOC~~ SOLN
10.0000 [IU] | Freq: Every day | SUBCUTANEOUS | Status: DC
Start: 2021-09-06 — End: 2021-09-08
  Administered 2021-09-06 – 2021-09-08 (×3): 10 [IU] via SUBCUTANEOUS
  Filled 2021-09-06 (×3): qty 0.1

## 2021-09-06 MED ORDER — LOSARTAN POTASSIUM 50 MG PO TABS
50.0000 mg | ORAL_TABLET | Freq: Every day | ORAL | Status: DC
  Administered 2021-09-06 – 2021-09-08 (×3): 50 mg via ORAL
  Filled 2021-09-06 (×3): qty 1

## 2021-09-06 MED ORDER — VENLAFAXINE HCL ER 37.5 MG PO CP24
37.5000 mg | ORAL_CAPSULE | Freq: Every day | ORAL | Status: DC
  Administered 2021-09-07 – 2021-09-08 (×2): 37.5 mg via ORAL
  Filled 2021-09-06 (×2): qty 1

## 2021-09-06 MED ORDER — METHYLPREDNISOLONE SODIUM SUCC 40 MG IJ SOLR: 40.0000 mg | Freq: Two times a day (BID) | INTRAMUSCULAR | Status: DC

## 2021-09-06 MED ORDER — IOHEXOL 350 MG/ML SOLN
61.0000 mL | Freq: Once | INTRAVENOUS | Status: AC | PRN
  Administered 2021-09-06: 61 mL via INTRAVENOUS

## 2021-09-06 MED ORDER — PIPERACILLIN-TAZOBACTAM 3.375 G IVPB
3.3750 g | Freq: Four times a day (QID) | INTRAVENOUS | Status: DC
Start: 2021-09-06 — End: 2021-09-06

## 2021-09-06 MED ORDER — METOPROLOL SUCCINATE ER 50 MG PO TB24
50.0000 mg | ORAL_TABLET | Freq: Every day | ORAL | Status: DC
  Administered 2021-09-06 – 2021-09-08 (×3): 50 mg via ORAL
  Filled 2021-09-06 (×3): qty 1

## 2021-09-06 MED ORDER — ROSUVASTATIN CALCIUM 5 MG PO TABS
10.0000 mg | ORAL_TABLET | Freq: Every day | ORAL | Status: DC
  Administered 2021-09-06 – 2021-09-08 (×3): 10 mg via ORAL
  Filled 2021-09-06 (×3): qty 2

## 2021-09-06 MED ORDER — INSULIN ASPART 100 UNIT/ML IJ SOLN
0.0000 [IU] | Freq: Three times a day (TID) | INTRAMUSCULAR | Status: DC
  Administered 2021-09-07: 15 [IU] via SUBCUTANEOUS
  Administered 2021-09-07: 3 [IU] via SUBCUTANEOUS
  Administered 2021-09-07: 5 [IU] via SUBCUTANEOUS
  Administered 2021-09-08: 11 [IU] via SUBCUTANEOUS
  Administered 2021-09-08: 3 [IU] via SUBCUTANEOUS

## 2021-09-06 MED ORDER — IPRATROPIUM-ALBUTEROL 20-100 MCG/ACT IN AERS: 1.0000 | INHALATION_SPRAY | Freq: Four times a day (QID) | RESPIRATORY_TRACT | Status: DC | PRN

## 2021-09-06 MED ORDER — PANTOPRAZOLE SODIUM 40 MG PO TBEC
40.0000 mg | DELAYED_RELEASE_TABLET | Freq: Every day | ORAL | Status: DC
  Administered 2021-09-06 – 2021-09-08 (×3): 40 mg via ORAL
  Filled 2021-09-06 (×3): qty 1

## 2021-09-06 MED ORDER — MELATONIN 5 MG PO TABS
10.0000 mg | ORAL_TABLET | Freq: Every day | ORAL | Status: DC
  Administered 2021-09-06 – 2021-09-07 (×2): 10 mg via ORAL
  Filled 2021-09-06 (×2): qty 2

## 2021-09-06 MED ORDER — INSULIN ASPART 100 UNIT/ML IJ SOLN
4.0000 [IU] | Freq: Three times a day (TID) | INTRAMUSCULAR | Status: DC
  Administered 2021-09-07 – 2021-09-08 (×3): 4 [IU] via SUBCUTANEOUS

## 2021-09-06 MED ORDER — POTASSIUM CHLORIDE CRYS ER 20 MEQ PO TBCR
40.0000 meq | EXTENDED_RELEASE_TABLET | Freq: Once | ORAL | Status: AC
  Administered 2021-09-06: 40 meq via ORAL
  Filled 2021-09-06: qty 2

## 2021-09-06 MED ORDER — PREDNISONE 20 MG PO TABS
20.0000 mg | ORAL_TABLET | Freq: Every day | ORAL | Status: DC
Start: 2021-09-07 — End: 2021-09-08
  Administered 2021-09-07 – 2021-09-08 (×2): 20 mg via ORAL
  Filled 2021-09-06 (×2): qty 1

## 2021-09-06 MED ORDER — AMLODIPINE BESYLATE 10 MG PO TABS
10.0000 mg | ORAL_TABLET | Freq: Every day | ORAL | Status: DC
  Administered 2021-09-06 – 2021-09-08 (×3): 10 mg via ORAL
  Filled 2021-09-06 (×3): qty 1

## 2021-09-06 MED ORDER — SULFAMETHOXAZOLE-TRIMETHOPRIM 800-160 MG PO TABS
1.0000 | ORAL_TABLET | ORAL | Status: DC
  Administered 2021-09-06 – 2021-09-08 (×2): 1 via ORAL
  Filled 2021-09-06 (×2): qty 1

## 2021-09-06 MED ORDER — IPRATROPIUM BROMIDE 0.03 % NA SOLN
1.0000 | Freq: Every day | NASAL | Status: DC
  Filled 2021-09-06: qty 30

## 2021-09-06 MED ORDER — ENOXAPARIN SODIUM 40 MG/0.4ML IJ SOSY
40.0000 mg | PREFILLED_SYRINGE | INTRAMUSCULAR | Status: DC
  Administered 2021-09-06 – 2021-09-07 (×2): 40 mg via SUBCUTANEOUS
  Filled 2021-09-06 (×2): qty 0.4

## 2021-09-06 MED ORDER — ACYCLOVIR 400 MG PO TABS
400.0000 mg | ORAL_TABLET | Freq: Every day | ORAL | Status: DC
  Administered 2021-09-06 – 2021-09-08 (×3): 400 mg via ORAL
  Filled 2021-09-06 (×3): qty 1

## 2021-09-06 MED ORDER — IPRATROPIUM-ALBUTEROL 0.5-2.5 (3) MG/3ML IN SOLN: 3.0000 mL | Freq: Four times a day (QID) | RESPIRATORY_TRACT | Status: DC | PRN

## 2021-09-06 MED ORDER — IPRATROPIUM BROMIDE 0.06 % NA SOLN
2.0000 | Freq: Every day | NASAL | Status: DC
  Administered 2021-09-07 – 2021-09-08 (×2): 2 via NASAL
  Filled 2021-09-06: qty 15

## 2021-09-06 NOTE — ED Provider Notes (Signed)
?[facility] ?Provider Note ? ? ?CSN: [id number] ?Arrival date & time: 09/06/21  0277 ? ?  ? ?History ? ?Chief Complaint  ?Patient presents with  ? Shortness of Breath  ? ? ?Meghan Alexander is a 66 y.o. female. ? ?HPI ?She reports she is here for evaluation of low oxygen and shortness of breath, which is primarily dyspnea on exertion.  She does not have orthopnea or periods of awakening at night for shortness of breath.  She follows her oxygen regularly and has been gradually decreasing over the last several weeks from "90% to 40%."  She has a follow-up appointment with her pulmonologist scheduled tomorrow.  She denies fever, productive cough, sensation of reflux or aspiration, chest pain, focal weakness or paresthesia. ?  ?Patient has an ongoing respiratory illness, for several. ?Hospitalization 08/02/2021 through 08/09/2021 for worsening respiratory symptoms with abnormal CT indicating groundglass opacities.  This has been treated with multiple rounds of antibiotics.  She has had a bronchoscopy, 08/03/2021, nondiagnostic but indicating elevated lymphocytes.  During this time she has been treated with high-dose steroids.  She felt somewhat better after steroids.  She has had bronchoalveolar lavage with cell count and culture, and AFB cultures.  She has been treated with Bactrim for PCP prophylaxis while on high-dose steroids.  ID does not plan on further evaluations.  Last follow-up with oncology for follicular lymphoma was on 08/17/2021.  At that time there is no progression of lymphoma.  Consideration has been given for possible reflux secondary to achalasia, causing chronic lung disease. ? ?Last GI evaluation was in 2017. ? ? ?Home Medications ?Prior to Admission medications   ?Medication Sig Start Date End Date Taking? Authorizing Provider  ?acetaminophen (TYLENOL) 500 MG tablet Take 2 tablets (1,000 mg total) by mouth every 8 (eight) hours as needed. 09/03/21 10/03/21 Yes  Marchwiany, Virgina Norfolk, MD  ?acyclovir (ZOVIRAX) 400 MG tablet TAKE 1 TABLET(400 MG) BY MOUTH DAILY ?Patient taking differently: Take 400 mg by mouth daily. 03/24/21  Yes Brunetta Genera, MD  ?albuterol (VENTOLIN HFA) 108 (90 Base) MCG/ACT inhaler Inhale 2 puffs into the lungs daily as needed for shortness of breath. 06/11/21  Yes [provider]  ?amLODipine (NORVASC) 10 MG tablet Take 10 mg by mouth daily. 09/02/21  Yes [provider]  ?aspirin EC 81 MG tablet Take 1 tablet (81 mg total) by mouth 2 (two) times daily for 28 days. Swallow whole. 09/04/21 10/02/21 Yes Marchwiany, Virgina Norfolk, MD  ?diphenhydrAMINE (BENADRYL) 25 MG tablet Take 25 mg by mouth at bedtime.   Yes [provider]  ?insulin detemir (LEVEMIR) 100 UNIT/ML FlexPen Inject 12 Units into the skin daily. ?Patient taking differently: Inject 20 Units into the skin daily. 08/09/21  Yes Allie Bossier, MD  ?ipratropium (ATROVENT) 0.03 % nasal spray Place 1 spray into both nostrils daily. 06/11/21  Yes [provider]  ?Ipratropium-Albuterol (COMBIVENT) 20-100 MCG/ACT AERS respimat Inhale 1 puff into the lungs every 6 (six) hours as needed for wheezing. 08/09/21  Yes Allie Bossier, MD  ?losartan (COZAAR) 50 MG tablet Take 50 mg by mouth daily.   Yes [provider]  ?Melatonin 10 MG CAPS Take 10 mg by mouth at bedtime.    Yes [provider]  ?metFORMIN (GLUCOPHAGE) 500 MG tablet Take 500 mg by mouth 2 (two) times daily with a meal. 07/23/19  Yes [provider]  ?metoprolol succinate (TOPROL-XL) 50 MG 24 hr tablet Take 50 mg by mouth  daily. 12/21/15  Yes [provider]  ?Multiple Vitamin (MULTIVITAMIN) tablet Take 1 tablet by mouth daily.   Yes [provider]  ?Niacin (VITAMIN B-3 PO) Take 1 tablet by mouth daily.   Yes [provider]  ?omeprazole (PRILOSEC) 20 MG capsule Take 20 mg by mouth 2 (two) times daily. 11/28/20  Yes [provider]  ?ondansetron  (ZOFRAN) 4 MG tablet Take 1 tablet (4 mg total) by mouth every 8 (eight) hours as needed for up to 14 days for nausea or vomiting. 09/03/21 09/17/21 Yes Marchwiany, Virgina Norfolk, MD  ?predniSONE (DELTASONE) 20 MG tablet Take 1 tablet (20 mg total) by mouth daily with breakfast. 09/03/21  Yes Willaim Sheng, MD  ?rosuvastatin (CRESTOR) 10 MG tablet Take 10 mg by mouth daily.   Yes [provider]  ?traMADol (ULTRAM) 50 MG tablet Take 1-2 tablets (50-100 mg total) by mouth every 6 (six) hours as needed for up to 7 days. 09/03/21 09/10/21 Yes Willaim Sheng, MD  ?venlafaxine XR (EFFEXOR-XR) 37.5 MG 24 hr capsule Take 37.5 mg by mouth daily with breakfast.   Yes [provider]  ?Accu-Chek Softclix Lancets lancets Use to test blood sugars up to 4 times daily as directed. 08/09/21   Allie Bossier, MD  ?Blood Glucose Monitoring Suppl (BLOOD GLUCOSE MONITOR SYSTEM) w/Device KIT Use to test blood sugars up to 4 times daily. 08/09/21   Allie Bossier, MD  ?glucose blood (ACCU-CHEK GUIDE) test strip Use to test blood sugars up to 4 times daily as directed. 08/09/21   Allie Bossier, MD  ?guaiFENesin (ROBITUSSIN) 100 MG/5ML liquid Take 5 mLs by mouth every 4 (four) hours as needed for cough or to loosen phlegm. ?Patient not taking: Reported on 08/17/2021 08/09/21   Allie Bossier, MD  ?HUMALOG KWIKPEN 100 UNIT/ML KwikPen Inject 10 Units into the skin in the morning, at noon, and at bedtime. ?Patient not taking: Reported on 09/06/2021 09/01/21   [provider]  ?Insulin Pen Needle 32G X 4 MM MISC Use to inject insulin up to 4 times daily as directed. 08/09/21   Allie Bossier, MD  ?phenol (CHLORASEPTIC) 1.4 % LIQD Use as directed 1 spray in the mouth or throat as needed for throat irritation / pain. ?Patient not taking: Reported on 09/06/2021 08/09/21   Allie Bossier, MD  ?sulfamethoxazole-trimethoprim (BACTRIM DS) 800-160 MG tablet Take 1 tablet by mouth 3 (three) times a week. ?Patient taking  differently: Take 1 tablet by mouth every Monday, Wednesday, and Friday. 08/27/21   Mignon Pine, DO  ?   ? ?Allergies    ?Latex, Codeine, Atenolol, Other, Hydrocodone, and Oxycodone   ? ?Review of Systems   ?Review of Systems ? ?Physical Exam ?Updated Vital Signs ?BP (!) 147/84   Pulse 68   Temp 98.1 ?F (36.7 ?C) (Oral)   Resp 17   Ht _0  (1.575 m)   Wt 72.6 kg   SpO2 98%   BMI 29.26 kg/m?  ?Physical Exam ? ?ED Results / Procedures / Treatments   ?Labs ?(all labs ordered are listed, but only abnormal results are displayed) ?Labs Reviewed  ?COMPREHENSIVE METABOLIC PANEL - Abnormal; Notable for the following components:  ?    Result Value  ? Potassium 3.2 (*)   ? Chloride 97 (*)   ? Glucose, Bld 113 (*)   ? Calcium 8.5 (*)   ? Total Protein 5.4 (*)   ? Albumin 2.6 (*)   ?  All other components within normal limits  ?CBC WITH DIFFERENTIAL/PLATELET - Abnormal; Notable for the following components:  ? RBC 3.33 (*)   ? Hemoglobin 8.4 (*)   ? HCT 27.8 (*)   ? MCH 25.2 (*)   ? RDW 16.1 (*)   ? nRBC 0.5 (*)   ? Lymphs Abs 0.2 (*)   ? Abs Immature Granulocytes 0.38 (*)   ? All other components within normal limits  ?CULTURE, BLOOD (ROUTINE X 2)  ?CULTURE, BLOOD (ROUTINE X 2)  ?RESPIRATORY PANEL BY PCR  ?RESP PANEL BY RT-PCR (FLU A&B, COVID) [id number]  ?LACTIC ACID, PLASMA  ?MAGNESIUM  ?SEDIMENTATION RATE  ?PROCALCITONIN  ? ? ?EKG ?EKG Interpretation ? ?Date/Time:  Monday Sep 06 2021 08:43:37 EDT ?Ventricular Rate:  82 ?PR Interval:  156 ?QRS Duration: 91 ?QT Interval:  409 ?QTC Calculation: 478 ?R Axis:   59 ?Text Interpretation: Sinus rhythm Since last tracing rate slower Otherwise no significant change Confirmed by Daleen Bo [phone]) on 09/06/2021 9:13:04 AM ? ?Radiology ?DG Chest 2 View ? ?Result Date: 09/06/2021 ?CLINICAL DATA:  66 year old female with history of hypoxia. Increasing shortness of breath since January. EXAM: CHEST - 2 VIEW COMPARISON:  Chest x-ray 08/03/2021. FINDINGS: Patchy ill-defined nodular  appearing areas are noted throughout the lungs bilaterally. In addition, there is more diffuse airspace consolidation throughout the left mid to lower lung. No definite pleural effusions. No pneumothorax. No evidence of pu

## 2021-09-06 NOTE — ED Notes (Signed)
Pt transported to xray 

## 2021-09-06 NOTE — Plan of Care (Signed)

## 2021-09-06 NOTE — ED Notes (Signed)
Called lab to have sed rate and procalcitonin added to previous sample ?

## 2021-09-06 NOTE — ED Notes (Signed)
Called lab to have magnesium added to previous collection ?

## 2021-09-06 NOTE — Plan of Care (Signed)
  Problem: Education: Goal: Knowledge of General Education information will improve Description Including pain rating scale, medication(s)/side effects and non-pharmacologic comfort measures Outcome: Progressing   Problem: Activity: Goal: Risk for activity intolerance will decrease Outcome: Progressing   Problem: Safety: Goal: Ability to remain free from injury will improve Outcome: Progressing   

## 2021-09-06 NOTE — ED Triage Notes (Signed)
Pt arrived to ED via EMS from home w/ c/o shob x 3 days. 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 Last week pt was diagnosed w/ ground glass opacities, but has been covid negative. Pt doesn't wear oxygen at baseline. Pt reports RA O2 this morning was 40% so she called 911. Fire arrived and got a RA sat of 87%. Fire placed pt on 6L O2 via [employer]. EMS placed pt on 5L. EMS VS: BP 172/94 (has not had BP meds yet), CBG 168. A&Ox4 ?

## 2021-09-06 NOTE — Consult Note (Addendum)
? ?NAME:  Meghan Alexander, MRN:  [id number], DOB:  30-Jul-1955, LOS: 0 ?ADMISSION DATE:  09/06/2021, CONSULTATION DATE:  09/06/21 ?REFERRING MD:  Eulis Foster - EM, CHIEF COMPLAINT:  SOB   ? ?History of Present Illness:  ? 66 yo F PMH follicular lymphoma grade II, ILD, achalasia, chronic rituximab use who presented to ED 5/15 with SOB. She does not wear O2 at baseline but on morning of presentation, she had sats in the 40s; therefore, she called 911. When fire first arrived, sats were up to 87.  She was placed on 5L O2 and had improvement to high 90s. She denies any recent fevers, chills, cough, wheeze, chest pain, N/V/D, abd pain, myalgias. She does have ongoing night sweats (had this when seen by pulm previously). She has had a negative quant gold on 08/02/21. ? ?She has been seen by pulm, ID and onc within the past month -- had a bronch 4/17 -- actinomyces felt to be contaminant, + strep mitis and given 14d Augmentin as well as prednisone along with Bactrim for prophylaxis. In 5/4 ID appointment, she said that she felt better after starting steroid course.  ? ?Per last office note from Dr. 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  ? ?On 5/12 pt  had ORIF R ankle. Pred 40 qD reduced to '20mg'$  qD. She states that ever since starting steroids from prior admission, she has felt better from both respiratory and GI  standpoint.  Prior to steroids, she had moderate dysphagia and early satiety with feelings of difficulty swallowing food.  She had esophogram in March 2023 that showed recurrent smooth narrowing of distal esophagus s/p laparoscopic fundoplication with prevention of passage of 24m barium tablet. She had apparently been seen by GI with plans for EGD with esophageal dilatation in the next week. ?Since the steroids, her dysphagia has improved and she is able to eat normal meals now without any sensation of dysphagia or food getting stuck. ? ? ?CTA in ED was negative for PE but demonstrated  patchy opacities bilaterally with upper lobe predominance and findings c/w achalasia. ? ?Pertinent  Medical History  ? 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  ? ?Significant Hospital Events: ?Including procedures, antibiotic start and stop dates in addition to other pertinent events   ? ? ?Interim History / Subjective:  ?Comfortable on 4L O2. Able to speak in full sentences. Feels better compared to when first arrived in ED. ? ?Objective   ?Blood pressure (!) 147/84, pulse 68, temperature 98.1 ?F (36.7 ?C), temperature source Oral, resp. rate 17, height '5\' 2"'$  (1.575 m), weight 72.6 kg, SpO2 98 %. ?   ?   ?No intake or output data in the 24 hours ending 09/06/21 1302 ?Filed Weights  ? 09/06/21 0852  ?Weight: 72.6 kg  ? ? ?Examination: ?General: Adult female, resting in bed, in NAD. ?Neuro: A&O x 3, no deficits. ?HEENT: [place]/AT. Sclerae anicteric. EOMI. ?Cardiovascular: RRR, no M/R/G.  ?Lungs: Respirations even and unlabored.  Coarse crackles bilaterally. ?Abdomen: BS x 4, soft, NT/ND.  ?Musculoskeletal: RLE dressings C/D/I from recent ankle surgery. No edema on left. ?Skin: Intact, warm, no rashes. ? ? ?Assessment & Plan:  ? ?Acute hypoxic respiratory failure - presumed 2/2 pneumonitis. Question whether Rituximab related vs ongoing aspiration given her hx  of achalasia and dysphagia with probable esophageal stricture.  Of note, she has felt much better ever since starting steroids (including dysphagia which is a bit puzzling as wouldn't necessarily expect to see this). ?- Continue supplemental O2 as needed to maintain SpO2 > 92%. ?- Continue steroids at home dose of '20mg'$  Pred (avoiding increasing and IV given recent ankle surgery) ?-  Continue empiric Bactrim for prophylaxis. ?- Would repeat esophogram now to compare to prior. ?- Consider getting GI involved this admit (she was supposedly scheduled for EGD dilatation next week). ?- F/u on RVP, sputum culture. ? ?PCCM will follow along. ? ? ?Best Practice (right click and "Reselect all SmartList Selections" daily)  ?Per primary team. ? ?Labs   ?CBC: ?Recent Labs  ?Lab 09/04/21 ?0628 09/06/21 ?2683  ?WBC 8.4 8.1  ?NEUTROABS  --  7.0  ?HGB 8.6* 8.4*  ?HCT 28.0* 27.8*  ?MCV 81.9 83.5  ?PLT 257 292  ? ? ?Basic Metabolic Panel: ?Recent Labs  ?Lab 09/04/21 ?0628 09/06/21 ?4196  ?NA 138 140  ?K 4.1 3.2*  ?CL 99 97*  ?CO2 28 31  ?GLUCOSE 258* 113*  ?BUN 15 12  ?CREATININE 0.94 0.85  ?CALCIUM 8.3* 8.5*  ?MG  --  1.8  ? ?GFR: ?Estimated Creatinine Clearance: 59.1 mL/min (by C-G formula based on SCr of 0.85 mg/dL). ?Recent Labs  ?Lab 09/04/21 ?0628 09/06/21 ?2229 09/06/21 ?7989  ?WBC 8.4 8.1  --   ?LATICACIDVEN  --   --  1.2  ? ? ?Liver Function Tests: ?Recent Labs  ?Lab 09/06/21 ?2119  ?AST 22  ?ALT 20  ?ALKPHOS 73  ?BILITOT 0.3  ?PROT 5.4*  ?ALBUMIN 2.6*  ? ?No results for input(s): LIPASE, AMYLASE in the last 168 hours. ?No results for input(s): AMMONIA in the last 168 hours. ? ?ABG ?No results found for: PHART, PCO2ART, PO2ART, HCO3, TCO2, ACIDBASEDEF, O2SAT  ? ?Coagulation Profile: ?No results for input(s): INR, PROTIME in the last 168 hours. ? ?Cardiac Enzymes: ?No results for input(s): CKTOTAL, CKMB, CKMBINDEX, TROPONINI in the last 168 hours. ? ?HbA1C: ?Hgb A1c MFr Bld  ?Date/Time Value Ref Range Status  ?08/08/2021 01:34 AM 7.5 (H) 4.8 - 5.6 % Final  ?  Comment:  ?  (NOTE) ?Pre diabetes:          5.7%-6.4% ? ?Diabetes:              >6.4% ? ?Glycemic control for   <7.0% ?adults with diabetes ?  ? ? ?CBG: ?Recent Labs  ?Lab 09/03/21 ?1230 09/03/21 ?1355 09/03/21 ?1747 09/03/21 ?2109 09/04/21 ?4174  ?GLUCAP 214* 224* 193* 346* 250*  ? ? ?Review of Systems:   ?All negative; except for those that are  bolded, which indicate positives. ? ?Constitutional: weight loss, weight gain, night sweats, fevers, chills, fatigue, weakness.  ?HEENT: headaches, sore throat, sneezing, nasal congestion, post nasal drip, difficulty swallowing, tooth/dental problems, visual complaints, visual changes, ear aches. ?Neuro: difficulty with speech, weakness, numbness, ataxia. ?CV:  chest pain, orthopnea, PND, swelling in lower extremities, dizziness, palpitations, syncope.  ?Resp: cough, hemoptysis, dyspnea, wheezing. ?GI: heartburn, indigestion, abdominal pain, nausea, vomiting, diarrhea, constipation, change in bowel habits, loss of appetite, hematemesis, melena, hematochezia.  ?GU: dysuria, change in color of urine, urgency or frequency, flank pain, hematuria. ?MSK: joint pain or swelling, decreased range of motion. ?Psych: change in mood or affect, depression, anxiety, suicidal ideations, homicidal ideations. ?Skin: rash, itching, bruising. ? ? 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  ? ?Surgical History:  ? ?Past Surgical History:  ?Procedure Laterality Date  ? ABDOMINAL HYSTERECTOMY    ? ADENOIDECTOMY    ? BRONCHIAL BRUSHINGS  08/03/2021  ? Procedure: BRONCHIAL BRUSHINGS;  Surgeon: Collene Gobble, MD;  Location[facility] ENDOSCOPY;  Service: Cardiopulmonary;;  ? BRONCHIAL WASHINGS  08/03/2021  ? Procedure: BRONCHIAL WASHINGS;  Surgeon: Collene Gobble, MD;  [facility] ENDOSCOPY;  Service: Cardiopulmonary;;  ? cosmetic surgeries    ? ESOPHAGEAL MANOMETRY N/A 12/02/2015  ? Procedure: ESOPHAGEAL MANOMETRY (EM);  Surgeon: [name], MD;  Location: [facility] ENDOSCOPY;  Service: Endoscopy;  Laterality: N/A;  ?  ESOPHAGOGASTRODUODENOSCOPY N/A 12/02/2015  ? Procedure: ESOPHAGOGASTRODUODENOSCOPY (EGD);  Surgeon: [name], MD;  Location: [facility] ENDOSCOPY;  Service: Endoscopy;  Laterality: N/A;  ? lymph node removal left leg    ? cat scratch surge

## 2021-09-06 NOTE — H&P (Addendum)
Family Medicine Teaching Service ?Hospital Admission History and Physical ?Service Pager: [phone] ? ?Patient name: Meghan Alexander Medical record number: [id number] ?Date of birth: June 01, 1955 Age: 66 y.o. Gender: female ? ?Primary Care Provider: [name], MD ?Consultants: Pulmonology ?Code Status: Full  ?Preferred Emergency Contact: Ri Prjman [phone] ? ?Chief Complaint: Hypoxia ? ?Assessment and Plan: ?Meghan Alexander is a 66 y.o. female presenting with hypoxia, dyspnea on exertion. PMH is significant for HTN, DM2, Achalasia, Hx of Follicular Lymphoma (grade II) in remission ? ?Acute hypoxic respiratory failure  COVID 19 ?DDX Pneumenitis vs changes 2/2 Onc Tx? ?Patient presents to ED with hypoxia at baseline and dyspnea on exertion.  Patient vital significant for O2 sat 86% on room air, and 95% on 4 L nasal cannula, with BP 145/86, RR 19, and HR 82.  Labs were significant for a K of 3.2 WBC WNL, Hgb 8.4, MCV 83.5, ESR 80, Pro-Cal normal, RPP 20 negative, COVID 19 positive. CXR demonstrated grossly abnormal appearance of lungs with nodular appearance concerning for widespread metastatic disease, with pneumonia in the left mid to lower lung/lingula.  CTA chest showed no evidence of PE with patchy opacities seen in bilateral lungs, more prominent in upper lobes, significantly worsen in the interim, concerning for pneumonia.  Patient admitted in April for similar episode with inconclusive work-up.  Pulmonology wanting to admit patient for further work-up, and concern for GI involvement possibly causing aspiration pneumonitis.  Differential includes COVID-19 infection, given patient recent lab finding and respiratory issues.  Patient could also be suffering from pneumonitis secondary to immunotherapy for her lymphoma, although less likely given oncologist felt delayed hypersensitivity pneumonitis from Rituxan was less likely, given the time course of her presentation.  Patient could be suffering from PE, though  less likely as CTPA showed no evidence of PE.  Also unlikely to be atypical pneumonia given multiple courses of antibiotics with no resolution, and negative work-up at last hospital admission. ?-Admit to FPTS, attending Dr. Talbert Cage ?- Pulmonology following, appreciate recs ?- GI following, appreciate recs ?- Vitals per unit ?- SPO2 greater than 90% ?- Up with assistance ?- Fall precautions ?- Droplet/contact precautions ?- Lovenox for DVT PPx ?- Carb modified diet/n.p.o. at midnight ?- AM BMP ?- DG esophagus w single CM ?- Prednisone 20 mg daily ?- Bactrim 800-160 mg qM-W-F ?- Follow-up blood cultures ?- Follow-up respiratory sputum culture ?- PT/OT eval and treat ? ?Normocytic Anemia ?Hgb 8.4 with MCV 83.5, was appreciated to be 10.2 w/ oncology 08/17/21 ?Patient noted to have dark stools per chart review, in note 09/06/21 GI note. ?-AM CBC ?-GI consult ? ?Hypokalemia ?K 3.2 on admission, repleted ?-AM BMP ? ?HTN ?BP 130-140's/70-80's on admission. Home meds Amlodipine 10 mg, Metoprolol succinate 50 mg, Losartan 50 mg daily. ?-continue home meds ? ? ?DM2 ?Patient on metformin 500 BID, 20 units Levemir daily, 10 units Humalog before meals. Hgb A1c 7.5 on 08/08/21 ?- 10 units Lantus daily ?- 4 units meal correction ?- mSSI ?- CBG checks qac & qhs ? ?Achalasia ?-S/p Heller myotomy with fundoplication in 8502 at [facility]. Had plans for repeat of the procedure but this has been postponed due to recent pulmonary symptoms. Patient to have follow up procedure next month. ? ?Follicular lymphoma grade II, multiple sites (remission) ?Patient follows with Dr. Irene Limbo, last seen 08/17/21. Patient has completed Bendamustine Rituxan chemotherapy followed by 18 months of Rituxan maintenance with the last dose on 02/25/2021. No findings consistent with lymphoma progression.  ? ?Broken Ankle: ?Patient  broke ankle stepping off of curve and had surgical repair on 5/12. RLE bandaged. On ASA 81 BID for DVT PPX ?-Hold ASA in  setting of lovenox ? ?FEN/GI: Carb Modified / NPO at midnight ?Prophylaxis: Lovenox ? ?Disposition: Med surg ? ?History of Present Illness:  Meghan Alexander is a 66 y.o. female presenting with hypoxia.  Breathing issues began in January, her doctors cannot figure out why she was having shortness of breath with activity and it was only visualized on x-ray or CT that she had groundglass opacities in her lungs.  She was hospitalized here in April 10-17 th, for 8 days where they did a full work-up that was negative.  Her PCP thinks this is secondary to a reaction to her last immunotherapy treatment in November for her lymphoma.  After speaking with her cancer doctor her lymphoma is in remission. Cancer doctors disagrees with immunotherapy as cause of respiratory symptoms. ? ?Patient notes breathing issues began around a week ago.  Since hospital discharge she has been on steroids that have helped with her breathing, however she had to use cut her steroid dose in half in preparation for surgery for a ankle fracture.  Since steroids have been cut patient appreciates she has had dyspnea with exertion and shortness of breath at baseline.  She denies any coughing, chest pain, fever, nausea, vomiting, diarrhea, or sick symptoms. She only reports shortness of breath. ? ?4/10-4/17 Hospitalization ?She was admitted 4/10 to 4/17 for acute respiratory failure with diffuse ground glass opacities noted on her CT Chest scan. She had progressive cough, night sweats, chills and dyspnea. She completed 3 courses of outpatient antibiotics without improvement. She underwent bronchoscopy with BAL of the RML and transbronchial brushings of the RLL on 4/11. BAL cell count was predominantly lymphocytic. Pathology did not indicate malignant cells on brushings. There was report of concern for actinomyces on path report. BAL culture grew strep mitis. Fungtitell and quantiferon gold were negative. ID was consulted. Patient was treated with  ceftriaxone and azithromycin and later placed on augmentin for 14 day course, as well as treated with high dose IV steroids and discharged on prednisone 48m daily, that was later continued my her pulmonologist.  ? ? ? ?Review Of Systems: Per HPI with the following additions:  ? ?Review of Systems  ?Constitutional:  Negative for fever.  ?Respiratory:  Positive for shortness of breath. Negative for chest tightness.   ?Cardiovascular:  Negative for chest pain.  ?Gastrointestinal:  Negative for abdominal pain, diarrhea, nausea and vomiting.   ? ?Patient Active Problem List  ? Diagnosis Date Noted  ? Hypoxia 09/06/2021  ? Closed right ankle fracture 09/03/2021  ? Pneumonitis 08/26/2021  ? Exertional dyspnea 08/07/2021  ? Sinus tachycardia 08/07/2021  ? Essential hypertension 08/07/2021  ? Dyspnea on exertion 08/03/2021  ? Uncontrolled type 2 diabetes mellitus with hyperglycemia ([facility] [place] 08/03/2021  ? Dyspnea 08/02/2021  ? Acute respiratory failure with hypoxia ([facility] 08/02/2021  ? HTN (hypertension) 08/02/2021  ? Type 2 diabetes mellitus ([facility] 08/02/2021  ? Hypokalemia 08/02/2021  ? Achalasia 08/02/2021  ? Elevated serum free T4 level 05/21/2019  ? 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 Counseling regarding advanced care planning and goals of care 04/02/2019  ? Sepsis-Biolateral PNa c Mod Risk PE in differential 10/17/2014  ? CAP (community acquired pneumonia) 10/17/2014  ? Pain in right foot 06/28/2013  ? Posterior tibial tendonitis of right leg 06/28/2013  ? ? ?Past Medical History: ?Past  Medical History:  ?Diagnosis Date  ? Allergy   ? year around allergies  ? Arthritis   ? fingers, knees, neck, back-osteoarthristis  ? Bronchitis   ? hx of  ? Complication of anesthesia   ? 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 GERD (gastroesophageal reflux disease)   ? hx of reflux-went away with elevating HOB   ? Hypertension   ? Pneumonia   ? hx of    ? PONV (postoperative nausea and vomiting)   ? Type 2 diabetes mellitus ([place]) 08/02/2021  ? ? ?Past Surgical History: ?Past Surgical History:  ?Procedure Laterality Date  ? ABDOMINAL HYSTERECTOMY    ? ADENOIDECTOMY

## 2021-09-07 DIAGNOSIS — Z79899 Other long term (current) drug therapy: Secondary | ICD-10-CM | POA: Diagnosis not present

## 2021-09-07 DIAGNOSIS — Z794 Long term (current) use of insulin: Secondary | ICD-10-CM | POA: Diagnosis not present

## 2021-09-07 DIAGNOSIS — Z801 Family history of malignant neoplasm of trachea, bronchus and lung: Secondary | ICD-10-CM | POA: Diagnosis not present

## 2021-09-07 DIAGNOSIS — M170 Bilateral primary osteoarthritis of knee: Secondary | ICD-10-CM | POA: Diagnosis present

## 2021-09-07 DIAGNOSIS — T451X5A Adverse effect of antineoplastic and immunosuppressive drugs, initial encounter: Secondary | ICD-10-CM | POA: Diagnosis present

## 2021-09-07 DIAGNOSIS — R609 Edema, unspecified: Secondary | ICD-10-CM | POA: Diagnosis not present

## 2021-09-07 DIAGNOSIS — E1165 Type 2 diabetes mellitus with hyperglycemia: Secondary | ICD-10-CM | POA: Diagnosis not present

## 2021-09-07 DIAGNOSIS — K219 Gastro-esophageal reflux disease without esophagitis: Secondary | ICD-10-CM | POA: Diagnosis present

## 2021-09-07 DIAGNOSIS — Z803 Family history of malignant neoplasm of breast: Secondary | ICD-10-CM | POA: Diagnosis not present

## 2021-09-07 DIAGNOSIS — U071 COVID-19: Secondary | ICD-10-CM | POA: Diagnosis present

## 2021-09-07 DIAGNOSIS — D649 Anemia, unspecified: Secondary | ICD-10-CM | POA: Diagnosis present

## 2021-09-07 DIAGNOSIS — E119 Type 2 diabetes mellitus without complications: Secondary | ICD-10-CM | POA: Diagnosis present

## 2021-09-07 DIAGNOSIS — M19041 Primary osteoarthritis, right hand: Secondary | ICD-10-CM | POA: Diagnosis present

## 2021-09-07 DIAGNOSIS — E876 Hypokalemia: Secondary | ICD-10-CM | POA: Diagnosis present

## 2021-09-07 DIAGNOSIS — J849 Interstitial pulmonary disease, unspecified: Secondary | ICD-10-CM | POA: Diagnosis present

## 2021-09-07 DIAGNOSIS — J9601 Acute respiratory failure with hypoxia: Secondary | ICD-10-CM | POA: Diagnosis present

## 2021-09-07 DIAGNOSIS — M479 Spondylosis, unspecified: Secondary | ICD-10-CM | POA: Diagnosis present

## 2021-09-07 DIAGNOSIS — T380X5A Adverse effect of glucocorticoids and synthetic analogues, initial encounter: Secondary | ICD-10-CM | POA: Diagnosis not present

## 2021-09-07 DIAGNOSIS — R0602 Shortness of breath: Secondary | ICD-10-CM | POA: Diagnosis present

## 2021-09-07 DIAGNOSIS — Z7984 Long term (current) use of oral hypoglycemic drugs: Secondary | ICD-10-CM | POA: Diagnosis not present

## 2021-09-07 DIAGNOSIS — Z7982 Long term (current) use of aspirin: Secondary | ICD-10-CM | POA: Diagnosis not present

## 2021-09-07 DIAGNOSIS — C8218 Follicular lymphoma grade II, lymph nodes of multiple sites: Secondary | ICD-10-CM | POA: Diagnosis present

## 2021-09-07 DIAGNOSIS — I10 Essential (primary) hypertension: Secondary | ICD-10-CM | POA: Diagnosis present

## 2021-09-07 DIAGNOSIS — M19042 Primary osteoarthritis, left hand: Secondary | ICD-10-CM | POA: Diagnosis present

## 2021-09-07 DIAGNOSIS — W1789XD Other fall from one level to another, subsequent encounter: Secondary | ICD-10-CM | POA: Diagnosis not present

## 2021-09-07 DIAGNOSIS — J679 Hypersensitivity pneumonitis due to unspecified organic dust: Secondary | ICD-10-CM | POA: Diagnosis present

## 2021-09-07 DIAGNOSIS — N390 Urinary tract infection, site not specified: Secondary | ICD-10-CM | POA: Diagnosis present

## 2021-09-07 DIAGNOSIS — S82841A Displaced bimalleolar fracture of right lower leg, initial encounter for closed fracture: Secondary | ICD-10-CM | POA: Diagnosis present

## 2021-09-07 DIAGNOSIS — R0902 Hypoxemia: Secondary | ICD-10-CM | POA: Diagnosis not present

## 2021-09-07 LAB — CBC
HCT: 28.5 % — ABNORMAL LOW (ref 36.0–46.0)
Hemoglobin: 8.9 g/dL — ABNORMAL LOW (ref 12.0–15.0)
MCH: 25.4 pg — ABNORMAL LOW (ref 26.0–34.0)
MCHC: 31.2 g/dL (ref 30.0–36.0)
MCV: 81.2 fL (ref 80.0–100.0)
Platelets: 311 10*3/uL (ref 150–400)
RBC: 3.51 MIL/uL — ABNORMAL LOW (ref 3.87–5.11)
RDW: 16.5 % — ABNORMAL HIGH (ref 11.5–15.5)
WBC: 7.8 10*3/uL (ref 4.0–10.5)
nRBC: 0 % (ref 0.0–0.2)

## 2021-09-07 LAB — BASIC METABOLIC PANEL
Anion gap: 10 (ref 5–15)
BUN: 8 mg/dL (ref 8–23)
CO2: 30 mmol/L (ref 22–32)
Calcium: 8.3 mg/dL — ABNORMAL LOW (ref 8.9–10.3)
Chloride: 93 mmol/L — ABNORMAL LOW (ref 98–111)
Creatinine, Ser: 0.77 mg/dL (ref 0.44–1.00)
GFR, Estimated: >60 mL/min (ref >60)
Glucose, Bld: 206 mg/dL — ABNORMAL HIGH (ref 70–99)
Potassium: 3.4 mmol/L — ABNORMAL LOW (ref 3.5–5.1)
Sodium: 133 mmol/L — ABNORMAL LOW (ref 135–145)

## 2021-09-07 LAB — GLUCOSE, CAPILLARY
Glucose-Capillary: 171 mg/dL — ABNORMAL HIGH (ref 70–99)
Glucose-Capillary: 247 mg/dL — ABNORMAL HIGH (ref 70–99)
Glucose-Capillary: 374 mg/dL — ABNORMAL HIGH (ref 70–99)
Glucose-Capillary: 245 mg/dL — ABNORMAL HIGH (ref 70–99)

## 2021-09-07 LAB — HIV ANTIBODY (ROUTINE TESTING W REFLEX): HIV Screen 4th Generation wRfx: NONREACTIVE

## 2021-09-07 MED ORDER — POTASSIUM CHLORIDE 10 MEQ/100ML IV SOLN
10.0000 meq | INTRAVENOUS | Status: DC
  Administered 2021-09-07: 10 meq via INTRAVENOUS
  Filled 2021-09-07 (×2): qty 100

## 2021-09-07 MED ORDER — ENSURE ENLIVE PO LIQD
237.0000 mL | Freq: Three times a day (TID) | ORAL | Status: DC
  Administered 2021-09-07 – 2021-09-08 (×3): 237 mL via ORAL

## 2021-09-07 MED ORDER — TRAMADOL HCL 50 MG PO TABS: 50.0000 mg | ORAL_TABLET | Freq: Four times a day (QID) | ORAL | Status: DC | PRN

## 2021-09-07 MED ORDER — ACETAMINOPHEN 325 MG PO TABS
650.0000 mg | ORAL_TABLET | Freq: Four times a day (QID) | ORAL | Status: DC
  Administered 2021-09-07 – 2021-09-08 (×6): 650 mg via ORAL
  Filled 2021-09-07 (×6): qty 2

## 2021-09-07 MED ORDER — ADULT MULTIVITAMIN W/MINERALS CH
1.0000 | ORAL_TABLET | Freq: Every day | ORAL | Status: DC
  Administered 2021-09-07 – 2021-09-08 (×2): 1 via ORAL
  Filled 2021-09-07 (×2): qty 1

## 2021-09-07 MED ORDER — POTASSIUM CHLORIDE CRYS ER 20 MEQ PO TBCR
20.0000 meq | EXTENDED_RELEASE_TABLET | Freq: Once | ORAL | Status: AC
  Administered 2021-09-07: 20 meq via ORAL
  Filled 2021-09-07: qty 1

## 2021-09-07 MED ORDER — TRAMADOL HCL 50 MG PO TABS
50.0000 mg | ORAL_TABLET | Freq: Four times a day (QID) | ORAL | Status: DC
  Administered 2021-09-07 – 2021-09-08 (×6): 50 mg via ORAL
  Filled 2021-09-07 (×6): qty 1

## 2021-09-07 NOTE — Progress Notes (Signed)
Family Medicine Teaching Service ?Daily Progress Note ?Intern Pager: [phone] ? ?Patient name: Meghan Alexander Medical record number: [id number] ?Date of birth: [birth year] Age: 66 y.o. Gender: female ? ?Primary Care Provider: [name], MD ?Consultants: GI, PCCM ?Code Status: Full Code  ? ?Pt Overview and Major Events to Date:  ?5/15: Intial encounter ? ?Assessment and Plan: ?Meghan Alexander is a 66 y.o. female with PMH significant for HTN, DM2, Achalasia, Hx of Follicular Lymphoma (grade II) in remission, presented to [facility] (09/06/2021) for acute RF and dyspnea. ?HD 0  ? ?Acute hypoxic respiratory failure 2/2 COVID 19 in the setting of ILD, improving ?Ddx: recovering rituximab induced pneumenitis flare, acute COVID, ILD flare. No PE on CTA. Currently on 4L [place] from 5 L. PCCM recommended continuing her home meds per below, ?PCCM consulted, following PRN ?Continue home prednisone 20 mg daily and Bactrim 1 tablet qMWF ?Wean supplemental O2 as tolerated, goal SPO2 >90% ?Outpatient pulmonology follow-up with Dr. Erin Fulling in 2 weeks arranged by PCCM ? ?Normocytic Anemia, stable ?Hb 8.9 < 8.6. GI has low suspicion for GI bleed along with stable Hb and recommended not having the EGD for  ?Follow-up CBC to trend ?  ?Hypokalemia ?K 3.2 on admission, repleted ?AM BMP  ?  ?HTN ?BP 140s/80s ?Home meds Amlodipine 10 mg, Metoprolol succinate 50 mg, Losartan 50 mg daily. ?  ?DM2, hyperglycemic 2/2 prednisone ?Fasting  cbg 171, cbgs 170s-200s. No short acting yd.  ?Home: metformin 500 BID, 20 units Levemir daily, 10 units Humalog before meals. Hgb A1c 7.5 on 08/08/21  ?10 units Lantus daily ?4 units meal correction ?CBG checks qac & qhs and SSI moderate ?  ?Achalasia s/p Heller myotomy with fundoplication in 9628, stable ?Heller myotomy with fundoplication in 3662 at [facility]. Denied difficulty swallowing. GI saw her and recommended against EGD currently given she has no issues swallowing currently and to keep her outpatient  appointment ?Patient to have follow up procedure next month.  ?  ?Follicular lymphoma grade II, multiple sites (remission) ?Patient follows with Dr. Irene Limbo outpatient, last seen 08/17/21, encourage follow-up ?  ?Broken Ankle, stable ?Continued to Hold ASA in setting of lovenox ? ? ?FEN/GI: Carb modified  ?PPx: enoxaparin (LOVENOX) injection 40 mg Start: 09/06/21 1615  ? ?Dispo: Pending PT recs, likely home  , No OT follow up    ?DME: TBA  (09/07/21) ? ? ?Subjective:  ?ON: No acute events ? ?No family at bedside. ? ?Still complaining of dyspnea, although much improved since yesterday.  She has no other concerns or acute complaints. ? ? ?Objective: ?Temp:  [97.8 ?F (36.6 ?C)-98.7 ?F (37.1 ?C)] 97.8 ?F (36.6 ?C) (05/16 0753) ?Pulse Rate:  [76-99] 76 (05/16 1123) ?Resp:  [16-24] 20 (05/16 1123) ?BP: (131-167)/(57-84) 131/70 (05/16 1123) ?SpO2:  [90 %-100 %] 93 % (05/16 1648) ?Physical Exam ?Vitals and nursing note reviewed.  ?Constitutional:   ?   General: She is awake.  ?   Appearance: She is not ill-appearing.  ?   Interventions: Nasal cannula in place.  ?HENT:  ?   Head: Normocephalic and atraumatic.  ?Cardiovascular:  ?   Rate and Rhythm: Normal rate and regular rhythm.  ?   Heart sounds: No murmur heard. ?  No gallop.  ?Pulmonary:  ?   Effort: Pulmonary effort is normal. No tachypnea or respiratory distress.  ?   Breath sounds: Decreased air movement present. No decreased breath sounds, wheezing or rales.  ?Abdominal:  ?   Palpations: Abdomen is soft.  ?  Tenderness: There is no abdominal tenderness. There is no guarding.  ?Musculoskeletal:  ?   Right lower leg: No tenderness. 1+ Pitting Edema present.  ?   Left lower leg: No tenderness. 1+ Pitting Edema present.  ?Skin: ?   General: Skin is warm and dry.  ?Neurological:  ?   General: No focal deficit present.  ?   Mental Status: She is alert and oriented to person, place, and time.  ?  ? ?Laboratory: ?Recent Labs  ?Lab 09/04/21 ?0628 09/06/21 ?2542 09/07/21 ?0222   ?WBC 8.4 8.1 7.8  ?HGB 8.6* 8.4* 8.9*  ?HCT 28.0* 27.8* 28.5*  ?PLT 257 292 311  ? ?Recent Labs  ?Lab 09/04/21 ?0628 09/06/21 ?7062 09/07/21 ?0222  ?NA 138 140 133*  ?K 4.1 3.2* 3.4*  ?CL 99 97* 93*  ?CO2 '28 31 30  '$ ?BUN '15 12 8  '$ ?CREATININE 0.94 0.85 0.77  ?CALCIUM 8.3* 8.5* 8.3*  ?PROT  --  5.4*  --   ?BILITOT  --  0.3  --   ?ALKPHOS  --  73  --   ?ALT  --  20  --   ?AST  --  22  --   ?GLUCOSE 258* 113* 206*  ? ? ?Imaging/Diagnostic Tests: ?EKG showed Sinus rhythm Since last tracing rate slower Otherwise no significant change Confirmed by Daleen Bo [phone]) on 09/06/2021 9:13:04 AM, HR 82, QTc 478  ?No results found.  ? ?Merrily Brittle, DO ?09/07/2021, 4:55 PM ?PGY-1, [place] ?[facility] Intern pager: [phone], text pages welcome  ?

## 2021-09-07 NOTE — Progress Notes (Signed)
? ?NAME:  Meghan Alexander, MRN:  [id number], DOB:  1955/05/10, LOS: 0 ?ADMISSION DATE:  09/06/2021, CONSULTATION DATE:  09/06/21 ?REFERRING MD:  Eulis Foster - EM, CHIEF COMPLAINT:  SOB   ? ?History of Present Illness:  ? 66 yo F PMH follicular lymphoma grade II, ILD, achalasia, chronic rituximab use who presented to ED 5/15 with SOB. She does not wear O2 at baseline but on morning of presentation, she had sats in the 40s; therefore, she called 911. When fire first arrived, sats were up to 87.  She was placed on 5L O2 and had improvement to high 90s. She denies any recent fevers, chills, cough, wheeze, chest pain, N/V/D, abd pain, myalgias. She does have ongoing night sweats (had this when seen by pulm previously). She has had a negative quant gold on 08/02/21. ? ?She has been seen by pulm, ID and onc within the past month -- had a bronch 4/17 -- actinomyces felt to be contaminant, + strep mitis and given 14d Augmentin as well as prednisone along with Bactrim for prophylaxis. In 5/4 ID appointment, she said that she felt better after starting steroid course.  ? ?Per last office note from Dr. 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  ? ?On 5/12 pt  had ORIF R ankle. Pred 40 qD reduced to '20mg'$  qD. She states that ever since starting steroids from prior admission, she has felt better from both respiratory and GI  standpoint.  Prior to steroids, she had moderate dysphagia and early satiety with feelings of difficulty swallowing food.  She had esophogram in March 2023 that showed recurrent smooth narrowing of distal esophagus s/p laparoscopic fundoplication with prevention of passage of 61m barium tablet. She had apparently been seen by GI with plans for EGD with esophageal dilatation in the next week. ?Since the steroids, her dysphagia has improved and she is able to eat normal meals now without any sensation of dysphagia or food getting stuck. ? ?CTA in ED was negative for PE but demonstrated patchy  opacities bilaterally with upper lobe predominance and findings c/w achalasia. ? ?Pertinent  Medical History  ? 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  ? ?Significant Hospital Events: ?Including procedures, antibiotic start and stop dates in addition to other pertinent events   ? ? ?Interim History / Subjective:  ?Having some ankle pain and HA this AM. ?Sats good on 4LPM ? ?Objective   ?Blood pressure (!) 144/80, pulse 79, temperature 97.8 ?F (36.6 ?C), temperature source Oral, resp. rate 18, height '5\' 2"'$  (1.575 m), weight 72.6 kg, SpO2 95 %. ?   ?   ? ?Intake/Output Summary (Last 24 hours) at 09/07/2021 0914 ?Last data filed at 09/06/2021 2100 ?Gross per 24 hour  ?Intake 60 ml  ?Output --  ?Net 60 ml  ? ?Filed Weights  ? 09/06/21 0852  ?Weight: 72.6 kg  ? ? ?Examination: ?No distress ?Lung exam actually pretty benign ?Moves all 4 ext to command ?R ankle wrapped ? ?COVID+ ?Pct neg ?Sed rate up ? ?Assessment & Plan:  ?See discussion yesterday. ?Now seems like she was recovering from an inflammatory ILD and then caught COVID.  Hopefully with newer variants this will be quick recovery although she is immunosuppressed so high risk for complications. ? ?Do not think we need  to chase down achalasia given new finding.  Given healing ankle injury and DM would also hold off on higher steroids. ? ?- Prednisone '20mg'$  daily, Bactrim MWF ?- Check LE duplex, high risk for VTE complicating; if + tx with 3 mo eliquis ?- Supportive care, she will let us know when feeling up to heading home ?- Moro titrated to sats > 90% ?- Encourage IS ?- Adjusted pain regimen for her ankle (see orders) ?- Will arrange f/u in 2-3 weeks with Dr. Erin Fulling in office ?- Sent  recs to [facility] by securechat ?- PCCM available PRN ? ?Best Practice (right click and "Reselect all SmartList Selections" daily)  ?Per primary team. ? ? ? ?

## 2021-09-07 NOTE — Hospital Course (Addendum)
Meghan Alexander is a 66 y.o. female PMH significant for ILD, Follicular Lymphoma (grade II) in remission, HTN, DM2, h/o Achalasia,  presented to [facility] (09/06/2021) for acute RF and dyspnea 2/2 COVID in the setting of recovering ILD flare. ? ?Acute hypoxic respiratory failure 2/2 COVID 19 in the setting of recovering ILD ?Other contributors are rituximab induced pneumonitis that may be contributing, given timeline and recent decrease in prednisone from 40 mg to 20 mg daily.  CT PE showed no pulmonary embolism but did show bilateral patchy opacities. Pulmonology was consulted and her COVID was treated conservatively, no additional treatment.DVT ultrasound was obtained which was negative for acute DVT.  Required 4 L nasal cannula on admission, at baseline she is on room air. She was discharged on 2 L nasal cannula and home DME O2 was provided at discharge. Continued home prednisone 20 mg daily and Bactrim 1 tablet qMWF for ppx with follow-up outpatient pulmonology in 2 weeks. ? ?Normocytic Anemia, Achalasia s/p laparoscopic fundoplication with prevention of passage of 51m barium tablet ?Hb 8.9. GI was consulted for concern of upper GI bleed relating to her achalasia and recent procedure.  Her hemoglobin stable during hospitalization. She had no difficulty swallowing and recommended that she continues to follow her outpatient gastroenterologist and to proceed with outpatient EGD.  Hemoglobin at discharge was 8.9.  Patient was started on ferrous sulfate 325 mg twice daily and will follow-up with PCP for anemia work-up. ? ?Other chronic conditions: ?Follicular lymphoma grade II, multiple sites (remission)-Patient follows with Dr. KIrene Limbooutpatient, last seen 08/17/21 ?Broken Ankle-ASA for ppx restarted at discharge ?DM2 ?HTN ? ?

## 2021-09-07 NOTE — Consult Note (Signed)
[facility] Consultation Note ? ?Referring Provider: No ref. provider found ?Primary Care Physician:  [name], MD ? ?Reason for Consultation:  achalasia ? ?HPI: Meghan Alexander is a 66 y.o. female admitted respiratory troubles.  Found to have [place].  Has history of achalasia with prior Heller myotomy.  Has prior issues, post-operatively over the past several months, with dysphagia, but of late she's had no troubles and is currently able to swallow well.  No abdominal pain, blood in stool, hematemesis. ? ? ?Past Medical History:  ?Diagnosis Date  ? Allergy   ? year around allergies  ? Arthritis   ? fingers, knees, neck, back-osteoarthristis  ? Bronchitis   ? hx of  ? Complication of anesthesia   ? 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 GERD (gastroesophageal reflux disease)   ? hx of reflux-went away with elevating HOB   ? Hypertension   ? Pneumonia   ? hx of   ? PONV (postoperative nausea and vomiting)   ? Type 2 diabetes mellitus ([place]) 08/02/2021  ? ? ?Past Surgical History:  ?Procedure Laterality Date  ? ABDOMINAL HYSTERECTOMY    ? ADENOIDECTOMY    ? BRONCHIAL BRUSHINGS  08/03/2021  ? Procedure: BRONCHIAL BRUSHINGS;  Surgeon: Collene Gobble, MD;  Location: [facility] ENDOSCOPY;  Service: Cardiopulmonary;;  ? BRONCHIAL WASHINGS  08/03/2021  ? Procedure: BRONCHIAL WASHINGS;  Surgeon: Collene Gobble, MD;  [facility] END[facility]COPY;  Service: Cardiopulmonary;;  ? cosmetic surgeries    ? ESOPHAGEAL MANOMETRY N/A 12/02/2015  ? Procedure: ESOPHAGEAL MANOMETRY (EM);  Surgeon: [name], MD;  Location: [facility] ENDOSCOPY;  Service: Endoscopy;  Laterality: N/A;  ? ESOPHAGOGASTRODUODENOSCOPY N/A 12/02/2015  ? Procedure: ESOPHAGOGASTRODUODENOSCOPY (EGD);  Surgeon: [name], MD;  Location: [facility] ENDOSCOPY;  Service: Endoscopy;  Laterality: N/A;  ? lymph node removal left leg    ? cat scratch surgery   ? ORIF ANKLE FRACTURE Right 09/03/2021  ? Procedure: OPEN REDUCTION INTERNAL  FIXATION (ORIF) ANKLE FRACTURE;  Surgeon: Willaim Sheng, MD;  Location: [place];  Service: Orthopedics;  Laterality: Right;  ? THROAT SURGERY    ? sleep apnea surgery   ? TONSILLECTOMY    ? VIDEO BRONCHOSCOPY  08/03/2021  ? Procedure: VIDEO BRONCHOSCOPY WITH FLUORO;  Surgeon: Collene Gobble, MD;  Location: [facility] ENDOSCOPY;  Service: Cardiopulmonary;;  ? ? ?Prior to Admission medications   ?Medication Sig Start Date End Date Taking? Authorizing Provider  ?acetaminophen (TYLENOL) 500 MG tablet Take 2 tablets (1,000 mg total) by mouth every 8 (eight) hours as needed. 09/03/21 10/03/21 Yes Marchwiany, Virgina Norfolk, MD  ?acyclovir (ZOVIRAX) 400 MG tablet TAKE 1 TABLET(400 MG) BY MOUTH DAILY ?Patient taking differently: Take 400 mg by mouth daily. 03/24/21  Yes Brunetta Genera, MD  ?albuterol (VENTOLIN HFA) 108 (90 Base) MCG/ACT inhaler Inhale 2 puffs into the lungs daily as needed for shortness of breath. 06/11/21  Yes [provider]  ?amLODipine (NORVASC) 10 MG tablet Take 10 mg by mouth daily. 09/02/21  Yes [provider]  ?aspirin EC 81 MG tablet Take 1 tablet (81 mg total) by mouth 2 (two) times daily for 28 days. Swallow whole. 09/04/21 10/02/21 Yes Marchwiany, Virgina Norfolk, MD  ?diphenhydrAMINE (BENADRYL) 25 MG tablet Take 25 mg by mouth at bedtime.   Yes [provider]  ?insulin detemir (LEVEMIR) 100 UNIT/ML FlexPen Inject 12 Units into the skin daily. ?Patient taking differently: Inject 20 Units into the skin daily. 08/09/21  Yes  Allie Bossier, MD  ?ipratropium (ATROVENT) 0.03 % nasal spray Place 1 spray into both nostrils daily. 06/11/21  Yes [provider]  ?Ipratropium-Albuterol (COMBIVENT) 20-100 MCG/ACT AERS respimat Inhale 1 puff into the lungs every 6 (six) hours as needed for wheezing. 08/09/21  Yes Allie Bossier, MD  ?losartan (COZAAR) 50 MG tablet Take 50 mg by mouth daily.   Yes [provider]  ?Melatonin 10 MG CAPS Take 10 mg by mouth at bedtime.    Yes  [provider]  ?metFORMIN (GLUCOPHAGE) 500 MG tablet Take 500 mg by mouth 2 (two) times daily with a meal. 07/23/19  Yes [provider]  ?metoprolol succinate (TOPROL-XL) 50 MG 24 hr tablet Take 50 mg by mouth daily. 12/21/15  Yes [provider]  ?Multiple Vitamin (MULTIVITAMIN) tablet Take 1 tablet by mouth daily.   Yes [provider]  ?Niacin (VITAMIN B-3 PO) Take 1 tablet by mouth daily.   Yes [provider]  ?omeprazole (PRILOSEC) 20 MG capsule Take 20 mg by mouth 2 (two) times daily. 11/28/20  Yes [provider]  ?ondansetron (ZOFRAN) 4 MG tablet Take 1 tablet (4 mg total) by mouth every 8 (eight) hours as needed for up to 14 days for nausea or vomiting. 09/03/21 09/17/21 Yes Marchwiany, Virgina Norfolk, MD  ?predniSONE (DELTASONE) 20 MG tablet Take 1 tablet (20 mg total) by mouth daily with breakfast. 09/03/21  Yes Willaim Sheng, MD  ?rosuvastatin (CRESTOR) 10 MG tablet Take 10 mg by mouth daily.   Yes [provider]  ?traMADol (ULTRAM) 50 MG tablet Take 1-2 tablets (50-100 mg total) by mouth every 6 (six) hours as needed for up to 7 days. 09/03/21 09/10/21 Yes Willaim Sheng, MD  ?venlafaxine XR (EFFEXOR-XR) 37.5 MG 24 hr capsule Take 37.5 mg by mouth daily with breakfast.   Yes [provider]  ?Accu-Chek Softclix Lancets lancets Use to test blood sugars up to 4 times daily as directed. 08/09/21   Allie Bossier, MD  ?Blood Glucose Monitoring Suppl (BLOOD GLUCOSE MONITOR SYSTEM) w/Device KIT Use to test blood sugars up to 4 times daily. 08/09/21   Allie Bossier, MD  ?glucose blood (ACCU-CHEK GUIDE) test strip Use to test blood sugars up to 4 times daily as directed. 08/09/21   Allie Bossier, MD  ?guaiFENesin (ROBITUSSIN) 100 MG/5ML liquid Take 5 mLs by mouth every 4 (four) hours as needed for cough or to loosen phlegm. ?Patient not taking: Reported on 08/17/2021 08/09/21   Allie Bossier, MD  ?HUMALOG KWIKPEN 100 UNIT/ML KwikPen  Inject 10 Units into the skin in the morning, at noon, and at bedtime. ?Patient not taking: Reported on 09/06/2021 09/01/21   [provider]  ?Insulin Pen Needle 32G X 4 MM MISC Use to inject insulin up to 4 times daily as directed. 08/09/21   Allie Bossier, MD  ?phenol (CHLORASEPTIC) 1.4 % LIQD Use as directed 1 spray in the mouth or throat as needed for throat irritation / pain. ?Patient not taking: Reported on 09/06/2021 08/09/21   Allie Bossier, MD  ?sulfamethoxazole-trimethoprim (BACTRIM DS) 800-160 MG tablet Take 1 tablet by mouth 3 (three) times a week. ?Patient taking differently: Take 1 tablet by mouth every Monday, Wednesday, and Friday. 08/27/21   Mignon Pine, DO  ? ? ?Current Facility-Administered Medications  ?Medication Dose Route Frequency Provider Last Rate Last Admin  ? acetaminophen (TYLENOL) tablet 650 mg  650 mg Oral Q6H WA Smith,  Darnelle Maffucci, MD   650 mg at 09/07/21 0634  ? acyclovir (ZOVIRAX) tablet 400 mg  400 mg Oral Daily Holley Bouche, MD   400 mg at 09/07/21 0816  ? amLODipine (NORVASC) tablet 10 mg  10 mg Oral Daily Holley Bouche, MD   10 mg at 09/07/21 9494  ? enoxaparin (LOVENOX) injection 40 mg  40 mg Subcutaneous Q24H Holley Bouche, MD   40 mg at 09/06/21 1811  ? insulin aspart (novoLOG) injection 0-15 Units  0-15 Units Subcutaneous TID WC Holley Bouche, MD   3 Units at 09/07/21 4739  ? insulin aspart (novoLOG) injection 4 Units  4 Units Subcutaneous TID WC Sowell, Brandon, MD      ? insulin glargine-yfgn (SEMGLEE) injection 10 Units  10 Units Subcutaneous Daily Holley Bouche, MD   10 Units at 09/07/21 0818  ? ipratropium (ATROVENT) 0.06 % nasal spray 2 spray  2 spray Each Nare Daily Lind Covert, MD   2 spray at 09/07/21 0818  ? ipratropium-albuterol (DUONEB) 0.5-2.5 (3) MG/3ML nebulizer solution 3 mL  3 mL Nebulization Q6H PRN Chambliss, Jeb Levering, MD      ? losartan (COZAAR) tablet 50 mg  50 mg Oral Daily Holley Bouche, MD   50 mg at 09/07/21 5844  ?  melatonin tablet 10 mg  10 mg Oral QHS Holley Bouche, MD   10 mg at 09/06/21 2140  ? metoprolol succinate (TOPROL-XL) 24 hr tablet 50 mg  50 mg Oral Daily Holley Bouche, MD   50 mg at 09/07/21 0806  ? panto

## 2021-09-07 NOTE — Progress Notes (Signed)
Initial Nutrition Assessment ? ?DOCUMENTATION CODES:  ? ?Not applicable ? ?INTERVENTION:  ? ?Ensure Plus High Protein po TID, each supplement provides 350 kcal and 20 grams of protein. ? ?MVI with minerals daily. ? ?NUTRITION DIAGNOSIS:  ? ?Increased nutrient needs related to acute illness (COVID) as evidenced by estimated needs. ? ?GOAL:  ? ?Patient will meet greater than or equal to 90% of their needs ? ?MONITOR:  ? ?PO intake, Supplement acceptance, Labs ? ?REASON FOR ASSESSMENT:  ? ?Malnutrition Screening Tool ?  ? ?ASSESSMENT:  ? ?66 yo female admitted with hypoxia, tested COVID positive. PMH includes HTN, DM-2, achalasia with prior Heller myotomy with Dor fundoplication, follicular lymphoma (grade II) in remission, GERD. ? ?Unable to speak with patient today as she was receiving nursing care.   ?Per nutrition screen on admission, patient has had unintentional weight loss, but has not been eating poorly PTA. Weight history reviewed. She has had a severe 15% weight loss within the past 4 months.  ?Per GI consult today, patient is not having any current trouble swallowing. EGD not needed at this time.  ? ?Currently on a carb modified diet. ?Meal intakes not recorded. ? ?Patient has increased nutrition needs d/t recent weight loss and for COVID-19 and would benefit from a PO supplement to ensure adequate protein and calorie intake. ? ?Labs reviewed. Na 133, K 3.4 ?CBG: 171-247 ? ?Medications reviewed and include Novolog, Semglee, prednisone. ? ?NUTRITION - FOCUSED PHYSICAL EXAM: ? ?Unable to complete ? ?Diet Order:   ?Diet Order   ? ?       ?  Diet Carb Modified Fluid consistency: Thin; Room service appropriate? Yes  Diet effective now       ?  ? ?  ?  ? ?  ? ? ?EDUCATION NEEDS:  ? ?No education needs have been identified at this time ? ?Skin:  Skin Assessment: Reviewed RN Assessment ? ?Last BM:  5/12 ? ?Height:  ? ?Ht Readings from Last 1 Encounters:  ?09/06/21 '5\' 2"'$  (1.575 m)  ? ? ?Weight:  ? ?Wt Readings from  Last 1 Encounters:  ?09/06/21 72.6 kg  ? ? ? ?BMI:  Body mass index is 29.26 kg/m?. ? ?Estimated Nutritional Needs:  ? ?Kcal:  1900-2100 ? ?Protein:  90-110 gm ? ?Fluid:  1.9-2.1 L ? ? ? ?Lucas Mallow RD, LDN, CNSC ?Please refer to Amion for contact information.                                                       ? ?

## 2021-09-07 NOTE — Progress Notes (Signed)
FPTS Brief Progress Note ? ?S Sleeping ? ?O: ?BP 121/60 (BP Location: Right Arm)   Pulse 79   Temp 98.5 ?F (36.9 ?C) (Oral)   Resp 19   Ht '5\' 2"'$  (1.575 m)   Wt 72.6 kg   SpO2 95%   BMI 29.26 kg/m?   ? ? ?A/P: ?VSS. ?Plan per day team  ? ?Orvis Brill, DO ?09/07/2021, 9:58 PM ?PGY-3, [place] [facility] Night Resident  ?Please page [phone] with questions.  ? ?

## 2021-09-07 NOTE — Evaluation (Signed)
Occupational Therapy Evaluation/Discharge ?Patient Details ?Name: Meghan Alexander ?MRN: [id number] ?DOB: 1956/02/08 ?Today's Date: 09/07/2021 ? ? ?History of Present Illness Meghan Alexander is a 66 y.o. female presenting with hypoxia, dyspnea on exertion - found be be COVID-19+. PMH:  R ankle fx 4/29 and ORIF of ankle on 5/13, T2DM, HTN, pneumococcal PNA  ? ?Clinical Impression ?  ?PTA, pt lives with spouse, reports ambulatory with RW/Rollator at home and maintaining NWB R LE status well. Pt reports able to complete most ADLs without assistance but spouse available to assist as needed. Pt presents now with new supplemental O2 needs and mild DOE with activity (questionable desat to 87% on 4 L O2 with activity but poor sensor signal throughout session). Overall, pt moving functionally at baseline for ADLs/mobility since recent R ankle fx. Educated on energy conservation strategies and UE HEP to maximize strength during admission (handouts provided). Anticipate pt to wean to RA and return home without need for skilled therapy follow-up at this time. OT to sign off.   ?   ? ?Recommendations for follow up therapy are one component of a multi-disciplinary discharge planning process, led by the attending physician.  Recommendations may be updated based on patient status, additional functional criteria and insurance authorization.  ? ?Follow Up Recommendations ? No OT follow up  ?  ?Assistance Recommended at Discharge PRN  ?Patient can return home with the following Assistance with cooking/housework;Assist for transportation;Help with stairs or ramp for entrance ? ?  ?Functional Status Assessment ? Patient has had a recent decline in their functional status and demonstrates the ability to make significant improvements in function in a reasonable and predictable amount of time.  ?Equipment Recommendations ? None recommended by OT  ?  ?Recommendations for Other Services   ? ? ?  ?Precautions / Restrictions Precautions ?Precautions:  Fall ?Precaution Comments: monitor O2 ?Restrictions ?Weight Bearing Restrictions: Yes ?RLE Weight Bearing: Non weight bearing  ? ?  ? ?Mobility Bed Mobility ?Overal bed mobility: Modified Independent ?  ?  ?  ?  ?  ?  ?  ?  ? ?Transfers ?Overall transfer level: Modified independent ?Equipment used: Rollator (4 wheels) ?  ?  ?  ?  ?  ?  ?  ?  ?  ? ?  ?Balance Overall balance assessment: Needs assistance ?Sitting-balance support: No upper extremity supported, Feet supported ?Sitting balance-[name]: Good ?  ?  ?Standing balance support: Bilateral upper extremity supported, Reliant on assistive device for balance ?Standing balance-[name]: Fair ?Standing balance comment: fair static standing on L LE, requires DME for mobility due to NWB R LE ?  ?  ?  ?  ?  ?  ?  ?  ?  ?  ?  ?   ? ?ADL either performed or assessed with clinical judgement  ? ?ADL Overall ADL's : Modified independent ?  ?  ?  ?  ?  ?  ?  ?  ?  ?  ?  ?  ?  ?  ?  ?  ?  ?  ?  ?General ADL Comments: able to complete sponge bathing with setup assist per NT this AM, has been using Rollator to mobilize in room to/from [facility] as needed. Able to demo NWB R LE with mobility using Rollator in room. Educated on energy conservation strategies due to mild SOB with mobility (questionable desat on 4 L O2) with quick recovery with seated rest break and pursed lip breathing. Provided ED handout, UE HEP  with theraband for review  ? ? ? ?Vision Ability to See in Adequate Light: 0 Adequate ?Patient Visual Report: No change from baseline ?Vision Assessment?: No apparent visual deficits  ?   ?Perception   ?  ?Praxis   ?  ? ?Pertinent Vitals/Pain Pain Assessment ?Pain Assessment: No/denies pain  ? ? ? ?Hand Dominance Right ?  ?Extremity/Trunk Assessment Upper Extremity Assessment ?Upper Extremity Assessment: Overall WFL for tasks assessed ?  ?Lower Extremity Assessment ?Lower Extremity Assessment: RLE deficits/detail ?RLE Deficits / Details: s/p ORIF, casted mid foot to  below knee ?  ?Cervical / Trunk Assessment ?Cervical / Trunk Assessment: Normal ?  ?Communication Communication ?Communication: No difficulties ?  ?Cognition Arousal/Alertness: Awake/alert ?Behavior During Therapy: [facility] for tasks assessed/performed ?Overall Cognitive Status: Within Functional Limits for tasks assessed ?  ?  ?  ?  ?  ?  ?  ?  ?  ?  ?  ?  ?  ?  ?  ?  ?  ?  ?  ?General Comments  SpO2 98% on 4 L O2, poor signal with questionable desat to 87% on 4 L O2. ? ?  ?Exercises   ?  ?Shoulder Instructions    ? ? ?Home Living Family/patient expects to be discharged to:: Private residence ?Living Arrangements: Spouse/significant other ?Available Help at Discharge: Family;Available 24 hours/day ?Type of Home: House ?Home Access: Stairs to enter ?Entrance Stairs-Number of Steps: 2 ?  ?Home Layout: Two level;Able to live on main level with bedroom/bathroom ?  ?  ?Bathroom Shower/Tub: Tub/shower unit ?  ?Bathroom Toilet: Standard ?Bathroom Accessibility: Yes ?How Accessible: Accessible via walker ?Home Equipment: Rollator (4 wheels);Crutches;Rolling Walker (2 wheels);Shower seat ?  ?  ?  ? ?  ?Prior Functioning/Environment Prior Level of Function : Independent/Modified Independent ?  ?  ?  ?  ?  ?  ?Mobility Comments: independent prior to fx. Recently using rollator as "wheelchair" to get around house. Has crutches that she uses to navigate stairs ?ADLs Comments: Has been managing ADLs with Setup assist at most since ankle fx. ordering DME and other devices (wedge pillow for LE, cast cover for shower) on [place] as needed ?  ? ?  ?  ?OT Problem List: Cardiopulmonary status limiting activity;Impaired balance (sitting and/or standing);Decreased activity tolerance ?  ?   ?OT Treatment/Interventions:    ?  ?OT Goals(Current goals can be found in the care plan section) Acute Rehab OT Goals ?Patient Stated Goal: reduce O2 needs ?OT Goal Formulation: All assessment and education complete, DC therapy  ?OT Frequency:   ?   ? ?Co-evaluation   ?  ?  ?  ?  ? ?  ?AM-PAC OT "6 Clicks" Daily Activity     ?Outcome Measure Help from another person eating meals?: None ?Help from another person taking care of personal grooming?: None ?Help from another person toileting, which includes using toliet, bedpan, or urinal?: A Little ?Help from another person bathing (including washing, rinsing, drying)?: A Little ?Help from another person to put on and taking off regular upper body clothing?: None ?Help from another person to put on and taking off regular lower body clothing?: None ?6 Click Score: 22 ?  ?End of Session Equipment Utilized During Treatment: Rollator (4 wheels);Oxygen ?Nurse Communication: Mobility status ? ?Activity Tolerance: Patient tolerated treatment well ?Patient left: in bed;with call bell/phone within reach;with family/visitor present ? ?OT Visit Diagnosis: Other abnormalities of gait and mobility (R26.89)  ?              ?  Time: 3295-1884 ?OT Time Calculation (min): 28 min ?Charges:  OT General Charges ?$OT Visit: 1 Visit ?OT Evaluation ?$OT Eval Low Complexity: 1 Low ?OT Treatments ?$Therapeutic Activity: 8-22 mins ? ?Malachy Chamber, OTR/L ?Acute Rehab Services ?Office: [phone]  ? ?Meghan Alexander ?09/07/2021, 2:30 PM ?

## 2021-09-07 NOTE — Progress Notes (Signed)
PT Cancellation Note ? ?Patient Details ?Name: Meghan Alexander ?MRN: [id number] ?DOB: 11-19-1955 ? ? ?Cancelled Treatment:    Reason Eval/Treat Not Completed: PT screened, no needs identified, will sign off Per OT, pt performing ADL's ModI-set up, ambulating with Rollator/RW and maintaining NWB without physical assist. Will sign off. Please re-consult if needs change. ? ?Wyona Almas, PT, DPT ?Acute Rehabilitation Services ?Pager [phone] ?Office [phone] ? ? ? ?Meghan Alexander ?09/07/2021, 2:32 PM ?

## 2021-09-07 NOTE — Progress Notes (Signed)
FPTS Brief Progress Note ? ?S: Sleeping ? ?O: ?BP (!) 167/84 (BP Location: Right Arm)   Pulse 99   Temp 98 ?F (36.7 ?C) (Oral)   Resp 16   Ht '5\' 2"'$  (1.575 m)   Wt 72.6 kg   SpO2 90%   BMI 29.26 kg/m?   ? ? ?A/P: ?Acute hypoxic respiratory failure, COVID 19 ?On 2.5L [place], w/ SpO2 >90%. Other VSS. ? ?Plan per day team  ?- Orders reviewed. Labs for AM ordered, which was adjusted as needed.  ?- If condition changes, plan includes starting treatment for COVID-19 if requiring HFNC (10L).  ? ?Orvis Brill, DO ?09/07/2021, 3:31 AM ?PGY-3, [facility] Night Resident  ?Please page [phone] with questions.  ? ?

## 2021-09-07 NOTE — Progress Notes (Signed)
Family Medicine Teaching Service ?Daily Progress Note ?Intern Pager: [phone] ? ?Patient name: Meghan Alexander Medical record number: [id number] ?Date of birth: 12-Jul-1955 Age: 66 y.o. Gender: female ? ?Primary Care Provider: [name], MD ?Consultants: GI, PCCM ?Code Status: Full ? ?Pt Overview and Major Events to Date:  ?5/15 admitted ? ?Assessment and Plan: ? ?Meghan Alexander is a 66 y.o. female with PMH significant for HTN, DM2, Achalasia, Hx of Follicular Lymphoma (grade II) in remission, presented to [facility] (09/06/2021) for acute RF and dyspnea. ? ?Acute hypoxic respiratory failure 2/2 COVID 19 in the setting of ILD, improving ?Currently on 2 L . PCCM saw yesterday, said if improving would be stable for discharge from their standpoint. DVT u/s obtained yesterday by PCCM and pending ?-PCCM consulted, following PRN ?-continue home prednisone 20 mg daily and bactrim 1 tablet qMWF ?-Keep O2 sats >92% ?-outpatient pulm f/u in 2 weeks arranged ?-Droplet precautions ? ?Normocytic anemia, stable ?Hgb 8.9 yesterday. GI has low suspicion for bleed, signed off yesterday.  ?-CBC ?-ferrous sulfate every other day ? ?Hypokalemia ?K 3.8 today ?-monitor ? ?HTN ?BP ranges of 116-145/60-82. Home medications of amlodipine 10 mg, metooprolol succinate 50 mg, losartan 50 mg ? ?T2DM ?CBGs are 171-374 (back to mid day). On prednisone currently.  Says that at home takes 20 units of long-acting and is supposed to take 10 units short acting with meals but has not been able to start that given she is in the hospital. ?-10 units lantus daily ?-4 units mealtime coverage ?-mSSI ?-CBG ?-continue home meds ? ?Achalasia s/p Heller myotomy with fundoplication in 9702, stable ?GI signed off yesetrday, recommended outpatient appointment follow-up and no EGD in hospital ?-monitor for dysphagia ? ?Follicular lymphoma grade II, multiple sites (remission ?-f/u outpatient ? ?R Broken Ankle,stable ?Was on ASA 81 BID for DVT ppx.  ?-hold home ASA,  continue lovenox in hospital ? ?FEN/GI: Carb modified ?PPx: Lovenox ?Dispo: Home pending home O2 needs ? ?Subjective:  ?Patient feels short of breath when moving, has been able to come down on oxygen overnight ? ?Objective: ?Temp:  [97.8 ?F (36.6 ?C)-98.5 ?F (36.9 ?C)] 98.5 ?F (36.9 ?C) (05/16 2021) ?Pulse Rate:  [65-94] 79 (05/16 2021) ?Resp:  [16-20] 19 (05/16 2021) ?BP: (116-145)/(57-80) 121/60 (05/16 2021) ?SpO2:  [93 %-96 %] 95 % (05/16 2021) ?Physical Exam: ?General: NAD, sitting upright in bed, eating ?Cardiovascular: RRR no murmurs rubs or gallops ?Respiratory: Crackles throughout posterior lung fields, speaking full sentences, no dyspnea on 2 L nasal cannula ?Abdomen: Nontender palpation, soft ?Extremities: Right lower extremity with boot in place and elevated, left lower extremity with trace edema ?Laboratory: ?Recent Labs  ?Lab 09/04/21 ?0628 09/06/21 ?6378 09/07/21 ?0222  ?WBC 8.4 8.1 7.8  ?HGB 8.6* 8.4* 8.9*  ?HCT 28.0* 27.8* 28.5*  ?PLT 257 292 311  ? ?Recent Labs  ?Lab 09/04/21 ?0628 09/06/21 ?5885 09/07/21 ?0222  ?NA 138 140 133*  ?K 4.1 3.2* 3.4*  ?CL 99 97* 93*  ?CO2 '28 31 30  '$ ?BUN '15 12 8  '$ ?CREATININE 0.94 0.85 0.77  ?CALCIUM 8.3* 8.5* 8.3*  ?PROT  --  5.4*  --   ?BILITOT  --  0.3  --   ?ALKPHOS  --  73  --   ?ALT  --  20  --   ?AST  --  22  --   ?GLUCOSE 258* 113* 206*  ? ? ?Imaging/Diagnostic Tests: ?No results found.   ? ?Gerrit Heck, MD ?09/07/2021, 10:48 PM ?PGY-1, [place] ?FPTS  Intern pager: [phone], text pages welcome  ?

## 2021-09-08 ENCOUNTER — Inpatient Hospital Stay (HOSPITAL_COMMUNITY): Payer: Medicare Other

## 2021-09-08 ENCOUNTER — Ambulatory Visit: Payer: Medicare Other | Admitting: Pulmonary Disease

## 2021-09-08 DIAGNOSIS — R609 Edema, unspecified: Secondary | ICD-10-CM

## 2021-09-08 DIAGNOSIS — R0902 Hypoxemia: Secondary | ICD-10-CM | POA: Diagnosis not present

## 2021-09-08 LAB — BASIC METABOLIC PANEL
Anion gap: 9 (ref 5–15)
BUN: 14 mg/dL (ref 8–23)
CO2: 33 mmol/L — ABNORMAL HIGH (ref 22–32)
Calcium: 9 mg/dL (ref 8.9–10.3)
Chloride: 98 mmol/L (ref 98–111)
Creatinine, Ser: 0.76 mg/dL (ref 0.44–1.00)
GFR, Estimated: >60 mL/min (ref >60)
Glucose, Bld: 162 mg/dL — ABNORMAL HIGH (ref 70–99)
Potassium: 3.8 mmol/L (ref 3.5–5.1)
Sodium: 140 mmol/L (ref 135–145)

## 2021-09-08 LAB — GLUCOSE, CAPILLARY
Glucose-Capillary: 158 mg/dL — ABNORMAL HIGH (ref 70–99)
Glucose-Capillary: 154 mg/dL — ABNORMAL HIGH (ref 70–99)
Glucose-Capillary: 317 mg/dL — ABNORMAL HIGH (ref 70–99)

## 2021-09-08 MED ORDER — FERROUS SULFATE 325 (65 FE) MG PO TABS: 325.0000 mg | ORAL_TABLET | ORAL | Status: DC

## 2021-09-08 MED ORDER — ACETAMINOPHEN 325 MG PO TABS: 650.0000 mg | ORAL_TABLET | Freq: Three times a day (TID) | ORAL | 0 refills | Status: DC | PRN

## 2021-09-08 MED ORDER — HUMALOG KWIKPEN 100 UNIT/ML ~~LOC~~ SOPN: 4.0000 [IU] | PEN_INJECTOR | Freq: Three times a day (TID) | SUBCUTANEOUS | 11 refills | Status: DC

## 2021-09-08 MED ORDER — ENSURE ENLIVE PO LIQD: 237.0000 mL | Freq: Three times a day (TID) | ORAL | 12 refills | Status: DC

## 2021-09-08 MED ORDER — INSULIN DETEMIR 100 UNIT/ML FLEXPEN: 10.0000 [IU] | PEN_INJECTOR | Freq: Every day | SUBCUTANEOUS | 0 refills | Status: DC

## 2021-09-08 MED ORDER — FERROUS SULFATE 325 (65 FE) MG PO TABS
325.0000 mg | ORAL_TABLET | ORAL | 0 refills | Status: DC
Start: 2021-09-08 — End: 2022-09-15

## 2021-09-08 MED ORDER — FERROUS SULFATE 325 (65 FE) MG PO TABS: 325.0000 mg | ORAL_TABLET | ORAL | 3 refills | Status: DC

## 2021-09-08 NOTE — Progress Notes (Signed)
Bilateral lower extremity venous duplex has been completed. ?Preliminary results can be found in CV Proc through chart review.  ? ?09/08/21 1:26 PM ?Carlos Levering RVT   ?

## 2021-09-08 NOTE — Progress Notes (Signed)
SATURATION QUALIFICATIONS: (This note is used to comply with regulatory documentation for home oxygen) ? ?Patient Saturations on Room Air at Rest = 89% ? ?Patient Saturations on Room Air while Ambulating = 86% ? ?Patient Saturations on 2 Liters of oxygen while Ambulating = 94% ? ?Please briefly explain why patient needs home oxygen: SOB at rest and with activity ?

## 2021-09-08 NOTE — Discharge Summary (Addendum)
Family Medicine Teaching Service Hospital Discharge Summary  Patient name: Meghan Alexander Medical record number: [id number] Date of birth: 02/09/1956 Age: 66 y.o. Gender: female Date of Admission: 09/06/2021  Date of Discharge: 09/08/2021 Admitting Physician: Marshall L Chambliss, MD  Primary Care Provider: Roberts, Ronald, MD Consultants: GI, PCCM  Indication for Hospitalization: Hypoxia  Discharge Diagnoses/Problem List:  Principal Problem:   Hypoxia  Disposition: Home   Discharge Condition: Stable  Discharge Exam:  General: NAD, sitting upright in bed, eating, alert and responsive to all questions Cardiovascular: RRR no murmurs rubs or gallops Respiratory: Crackles throughout posterior lung fields, speaking full sentences, no dyspnea on 2 L nasal cannula Abdomen: Nontender palpation, soft, nondistended Extremities: Right lower extremity with boot in place and elevated, left lower extremity with trace edema, no calf swelling/erythema  Laboratory:  Brief Hospital Course:  Meghan Alexander is a 66 y.o. female PMH significant for ILD, Follicular Lymphoma (grade II) in remission, HTN, DM2, h/o Achalasia,  presented to [facility] (09/06/2021) for acute RF and dyspnea 2/2 COVID in the setting of recovering ILD flare.  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  CT PE showed no pulmonary embolism but did show bilateral patchy opacities. Pulmonology was consulted and her COVID was treated conservatively, no additional treatment.DVT ultrasound was obtained which was negative for acute DVT.  Required 4 L nasal cannula on admission, at baseline she is on room air. She was discharged on 2 L nasal cannula and home DME O2 was provided at discharge. Continued home prednisone 20 mg daily and Bactrim 1 tablet qMWF for ppx  with follow-up outpatient pulmonology in 2 weeks.  Normocytic Anemia, Achalasia s/p laparoscopic fundoplication with prevention of passage of 13mm barium tablet Hb 8.9. GI was consulted for concern of upper GI bleed relating to her achalasia and recent procedure.  Her hemoglobin stable during hospitalization. She had no difficulty swallowing and recommended that she continues to follow her outpatient gastroenterologist and to proceed with outpatient EGD.  Hemoglobin at discharge was 8.9.  Patient was started on ferrous sulfate 325 mg twice daily and will follow-up with PCP for anemia work-up.  Other chronic conditions: Follicular lymphoma grade II, multiple sites (remission)-Patient follows with Dr. Kale outpatient, last seen 08/17/21 Broken Ankle-ASA for ppx restarted at discharge DM2 HTN  Issues for Follow Up:  Unclear anemic etiology. Repeat CBC to trend Hb, consider anemia work-up. PCP to consider trial of PO iron.  DM2, consider adjusting home meds given on prednisone and recent surgery for optimal healing Acute hypoxic respiratory failure, reassess need for home O2 EGD per her outpatient GI  Significant Procedures:   Significant Labs and Imaging:  Recent Labs  Lab 09/04/21 0628 09/06/21 0933 09/07/21 0222  WBC 8.4 8.1 7.8  HGB 8.6* 8.4* 8.9*  HCT 28.0* 27.8* 28.5*  PLT 257 292 311   Recent Labs  Lab 09/04/21 0628 09/06/21 0933 09/07/21 0222 09/08/21 0641  NA 138 140 133* 140  K 4.1 3.2* 3.4* 3.8  CL 99 97* 93* 98  CO2 28 31 30 33*  GLUCOSE 258* 113* 206* 162*  BUN 15 12 8 14  CREATININE 0.94 0.85 0.77 0.76  CALCIUM 8.3* 8.5* 8.3* 9.0  MG  --  1.8  --   --   ALKPHOS  --  73  --   --     AST  --  22  --   --   ALT  --  20  --   --   ALBUMIN  --  2.6*  --   --     Results/Tests Pending at Time of Discharge:   Discharge Medications:  Allergies as of 09/08/2021       Reactions   Latex Other (See Comments)   Blisters / skin peeling   Codeine Nausea And Vomiting,  Other (See Comments)   hallucinations   Atenolol Cough   Other Other (See Comments)   DermaPlast? Used after surgery - caused swelling, itching, and wound broke open   Hydrocodone Nausea And Vomiting   Oxycodone Nausea And Vomiting        Medication List     STOP taking these medications    guaiFENesin 100 MG/5ML liquid Commonly known as: ROBITUSSIN   phenol 1.4 % Liqd Commonly known as: CHLORASEPTIC       TAKE these medications    Accu-Chek Guide test strip Generic drug: glucose blood Use to test blood sugars up to 4 times daily as directed.   Accu-Chek Guide w/Device Kit Use to test blood sugars up to 4 times daily.   Accu-Chek Softclix Lancets lancets Use to test blood sugars up to 4 times daily as directed.   acetaminophen 325 MG tablet Commonly known as: TYLENOL Take 2 tablets (650 mg total) by mouth every 8 (eight) hours as needed. What changed:  medication strength how much to take   acyclovir 400 MG tablet Commonly known as: ZOVIRAX TAKE 1 TABLET(400 MG) BY MOUTH DAILY What changed: See the new instructions.   albuterol 108 (90 Base) MCG/ACT inhaler Commonly known as: VENTOLIN HFA Inhale 2 puffs into the lungs daily as needed for shortness of breath.   amLODipine 10 MG tablet Commonly known as: NORVASC Take 10 mg by mouth daily.   aspirin EC 81 MG tablet Take 1 tablet (81 mg total) by mouth 2 (two) times daily for 28 days. Swallow whole.   Combivent Respimat 20-100 MCG/ACT Aers respimat Generic drug: Ipratropium-Albuterol Inhale 1 puff into the lungs every 6 (six) hours as needed for wheezing.   diphenhydrAMINE 25 MG tablet Commonly known as: BENADRYL Take 25 mg by mouth at bedtime.   feeding supplement Liqd Take 237 mLs by mouth 3 (three) times daily between meals.   ferrous sulfate 325 (65 FE) MG tablet Take 1 tablet (325 mg total) by mouth every other day.   ferrous sulfate 325 (65 FE) MG tablet Take 1 tablet (325 mg total) by  mouth every other day.   HumaLOG KwikPen 100 UNIT/ML KwikPen Generic drug: insulin lispro Inject 4 Units into the skin with breakfast, with lunch, and with evening meal. What changed:  how much to take when to take this   insulin detemir 100 UNIT/ML FlexPen Commonly known as: LEVEMIR Inject 10 Units into the skin daily. What changed: how much to take   ipratropium 0.03 % nasal spray Commonly known as: ATROVENT Place 1 spray into both nostrils daily.   losartan 50 MG tablet Commonly known as: COZAAR Take 50 mg by mouth daily.   Melatonin 10 MG Caps Take 10 mg by mouth at bedtime.   metFORMIN 500 MG tablet Commonly known as: GLUCOPHAGE Take 500 mg by mouth 2 (two) times daily with a meal.   metoprolol succinate 50 MG 24 hr tablet Commonly known as: TOPROL-XL Take 50 mg by mouth daily.   multivitamin tablet Take 1  tablet by mouth daily.   omeprazole 20 MG capsule Commonly known as: PRILOSEC Take 20 mg by mouth 2 (two) times daily.   ondansetron 4 MG tablet Commonly known as: Zofran Take 1 tablet (4 mg total) by mouth every 8 (eight) hours as needed for up to 14 days for nausea or vomiting.   Pentips 32G X 4 MM Misc Generic drug: Insulin Pen Needle Use to inject insulin up to 4 times daily as directed.   predniSONE 20 MG tablet Commonly known as: DELTASONE Take 1 tablet (20 mg total) by mouth daily with breakfast.   rosuvastatin 10 MG tablet Commonly known as: CRESTOR Take 10 mg by mouth daily.   sulfamethoxazole-trimethoprim 800-160 MG tablet Commonly known as: BACTRIM DS Take 1 tablet by mouth 3 (three) times a week. What changed: when to take this   traMADol 50 MG tablet Commonly known as: Ultram Take 1-2 tablets (50-100 mg total) by mouth every 6 (six) hours as needed for up to 7 days.   venlafaxine XR 37.5 MG 24 hr capsule Commonly known as: EFFEXOR-XR Take 37.5 mg by mouth daily with breakfast.   VITAMIN B-3 PO Take 1 tablet by mouth daily.                Durable Medical Equipment  (From admission, onward)           Start     Ordered   09/08/21 1338  For home use only DME oxygen  Once       Question Answer Comment  Length of Need 6 Months   Mode or (Route) Nasal cannula   Liters per Minute 2   Frequency Continuous (stationary and portable oxygen unit needed)   Oxygen delivery system Gas      09/08/21 1337            Discharge Instructions: Please refer to Patient Instructions section of EMR for full details.  Patient was counseled important signs and symptoms that should prompt return to medical care, changes in medications, dietary instructions, activity restrictions, and follow up appointments.   Follow-Up Appointments:  Follow-up Information     Dewald, Jonathan B, MD. Schedule an appointment as soon as possible for a visit in 2 week(s).   Specialty: Pulmonary Disease Why: Make an appointment to follow up with your pulmonologist in 2-3 weeks after hospital discharge. Contact information: [address] [place] [address] [phone]         Roberts, Ronald, MD. Schedule an appointment as soon as possible for a visit in 1 week(s).   Specialty: Internal Medicine Why: Make a hospital follow up appointment with your primary care doctor. Should be about 1 week after hospital discharge. Contact information: [address] [place] [address] [phone]                 , , MD 09/08/2021, 4:29 PM PGY-1, [facility]  

## 2021-09-08 NOTE — Discharge Instructions (Addendum)
Dear Meghan Alexander, ? ?Thank you for letting us participate in your care. You were hospitalized for hypoxia and diagnosed with COVID.  ? ?POST-HOSPITAL & CARE INSTRUCTIONS ?Please continue your home medicines without any changes.  ?You were sent home on oxygen to use until you no longer need it. You will know this when your finger oxygen measurements are 95% and above without oxygen.  ?Make follow up appointments with your pulmonologist and primary care doctor as instructed below.  ?Go to your follow up appointments (listed below) ? ? ?DOCTOR'S APPOINTMENT   ?Future Appointments  ?Date Time Provider [place]  ?09/08/2021  2:00 PM [facility] US 5 [place] [facility]  ?11/16/2021  1:00 PM [facility] None  ?11/16/2021  1:30 PM Brunetta Genera, MD [facility] None  ? ? Follow-up Information   ? ? Freddi Starr, MD. Schedule an appointment as soon as possible for a visit in 2 week(s).   ?Specialty: Pulmonary Disease ?Why: Make an appointment to follow up with your pulmonologist in 2-3 weeks after hospital discharge. ?Contact information: ?[address] ?[address] ?[place] [address] ?[phone] ? ? ?  ?  ? ? [name], MD. Schedule an appointment as soon as possible for a visit in 1 week(s).   ?Specialty: Internal Medicine ?Why: Make a hospital follow up appointment with your primary care doctor. Should be about 1 week after hospital discharge. ?Contact information: ?[address] [place] [address] ?[place] [address] ?[phone] ? ? ?  ?  ? ?  ?  ? ?  ? ?Take care and be well! ? ?Family Medicine Teaching Service Inpatient Team ?[place]  ?[facility]  ?[address] [place], [place] [address] ?[phone] ?

## 2021-09-08 NOTE — Plan of Care (Signed)

## 2021-09-08 NOTE — TOC Transition Note (Signed)
Transition of Care (TOC) - CM/SW Discharge Note ? ? ?Patient Details  ?Name: Meghan Alexander ?MRN: [id number] ?Date of Birth: 03/07/1956 ? ?Transition of Care [facility]) CM/SW Contact:  ?Sharin Mons, RN ?Phone Number: ?09/08/2021, 12:28 PM ? ? ?Clinical Narrative:    ?Patient will DC to: home ?Anticipated DC date: 09/08/2021 ?Family notified: yes, husband ?Transport by: car ? ?Readmitted with c/o SOB, hx of Non-Hodgkin's Lymphoma . From home with husband. Previous hospitalization, R ankle fx ( s/p ORIF 5/12). ?Per MD patient ready for DC today. RN, patient, and patient's husband notified of DC.  Order noted for DME oxygen. Referral made with [employer]. Equipment will be delivered to bedside prior to d/c. Post hospital follow ups noted on AVS. ?Pt woithout Rx MED concerns. ?Husband to provide transportation to home. ? ?RNCM will sign off for now as intervention is no longer needed. Please consult [place] again if new needs arise.  ? ? ?Final next level of care: Home/Self Care ?Barriers to Discharge: No Barriers Identified ? ? ?Patient Goals and CMS Choice ?  ?  ?Choice offered to / list presented to : Patient ? ?Discharge Placement ?  ?           ?  ?  ?  ?  ? ?Discharge Plan and Services ?  ?  ?           ?DME Arranged: Oxygen ?DME Agency: [facility] ?Date DME Agency Contacted: 09/08/21 ?Time DME Agency Contacted: [phone] ?Representative spoke with at DME Agency: Letta Kocher ?  ?  ?  ?  ?  ? ?Social Determinants of Health (SDOH) Interventions ?  ? ? ?Readmission Risk Interventions ?   ? View : No data to display.  ?  ?  ?  ? ? ? ? ? ?

## 2021-09-08 NOTE — Progress Notes (Cosign Needed)
?  ?  Durable Medical Equipment  ?(From admission, onward)  ?  ? ? ?  ? ?  Start     Ordered  ? 09/07/21 1439  For home use only DME oxygen  Once       ?Question Answer Comment  ?Length of Need 6 Months   ?Mode or (Route) Nasal cannula   ?Frequency Continuous (stationary and portable oxygen unit needed)   ?Oxygen delivery system Gas   ?  ? 09/07/21 1439  ? ?  ?  ? ?  ?  ?

## 2021-09-10 ENCOUNTER — Other Ambulatory Visit: Payer: Medicare Other

## 2021-09-10 ENCOUNTER — Ambulatory Visit: Payer: Medicare Other | Admitting: Hematology

## 2021-09-10 ENCOUNTER — Ambulatory Visit: Payer: Medicare Other

## 2021-09-10 NOTE — Anesthesia Postprocedure Evaluation (Signed)
Anesthesia Post Note  Patient: Meghan Alexander  Procedure(s) Performed: OPEN REDUCTION INTERNAL FIXATION (ORIF) ANKLE FRACTURE (Right: Ankle)     Patient location during evaluation: PACU Anesthesia Type: General Level of consciousness: awake Pain management: pain level controlled Vital Signs Assessment: post-procedure vital signs reviewed and stable Respiratory status: spontaneous breathing Cardiovascular status: stable Postop Assessment: no apparent nausea or vomiting Anesthetic complications: no   No notable events documented.  Last Vitals:  Vitals:   09/04/21 0534 09/04/21 0727  BP: (!) 159/101 (!) 171/89  Pulse: 69 72  Resp:  18  Temp:  37.2 C  SpO2:  94%    Last Pain:  Vitals:   09/04/21 0945  TempSrc:   PainSc: 4                  Javier Gell

## 2021-09-11 LAB — CULTURE, BLOOD (ROUTINE X 2): Culture: NO GROWTH

## 2021-09-12 LAB — CULTURE, BLOOD (ROUTINE X 2)
Culture: NO GROWTH
Special Requests: ADEQUATE

## 2021-09-14 ENCOUNTER — Telehealth: Payer: Self-pay

## 2021-09-14 NOTE — Telephone Encounter (Signed)
Patient's appointment from 5/17 was pushed to 09/24/2021- Will mail appointment reminder to address on file. Nothing further needed.

## 2021-09-14 NOTE — Telephone Encounter (Signed)
-----   Message from Freddi Starr, MD sent at 09/07/2021 12:17 PM EDT ----- Regarding: RE: Meghan Alexander patient with COVID Yes, pushing out appointment sounds good.   Thanks, [name]  ----- Message ----- From: Candee Furbish, MD Sent: 09/07/2021   8:02 AM EDT To: Freddi Starr, MD, [facility] Triage Pool Subject: Meghan Alexander patient with COVID                          Hi can we push back this patient's appt 2 weeks.  She caught covid presumably during her ankle repair.  Currently in hospital but doing okay.   Thanks, Linna Hoff

## 2021-09-20 LAB — ACID FAST CULTURE WITH REFLEXED SENSITIVITIES (MYCOBACTERIA): Acid Fast Culture: NEGATIVE

## 2021-09-21 LAB — MISC LABCORP TEST (SEND OUT): Labcorp test code: 183526

## 2021-09-24 ENCOUNTER — Ambulatory Visit (INDEPENDENT_AMBULATORY_CARE_PROVIDER_SITE_OTHER): Payer: Medicare Other

## 2021-09-24 ENCOUNTER — Ambulatory Visit (INDEPENDENT_AMBULATORY_CARE_PROVIDER_SITE_OTHER): Payer: Medicare Other | Admitting: Pulmonary Disease

## 2021-09-24 ENCOUNTER — Encounter: Payer: Self-pay | Admitting: Pulmonary Disease

## 2021-09-24 VITALS — BP 116/70 | HR 90 | Ht 62.0 in

## 2021-09-24 DIAGNOSIS — J189 Pneumonia, unspecified organism: Secondary | ICD-10-CM | POA: Diagnosis not present

## 2021-09-24 DIAGNOSIS — K220 Achalasia of cardia: Secondary | ICD-10-CM | POA: Diagnosis not present

## 2021-09-24 DIAGNOSIS — J849 Interstitial pulmonary disease, unspecified: Secondary | ICD-10-CM | POA: Diagnosis not present

## 2021-09-24 MED ORDER — PREDNISONE 10 MG PO TABS: 10.0000 mg | ORAL_TABLET | Freq: Every day | ORAL | 2 refills | Status: DC

## 2021-09-24 MED ORDER — SULFAMETHOXAZOLE-TRIMETHOPRIM 800-160 MG PO TABS: 1.0000 | ORAL_TABLET | ORAL | 0 refills | Status: DC

## 2021-09-24 NOTE — Patient Instructions (Addendum)
We will check your oxygen levels today while walking  We will check inflammatory lab markers today  We will check a chest x-ray today  Prednisone taper: '30mg'$  daily 6/3 - 6/17 '20mg'$  daily 6/18 - 7/2 '10mg'$  daily 7/3 - 7/17  Stop bactrim on 7/3 after decreasing below '20mg'$  of prednisone daily  Follow up in 6 weeks

## 2021-09-24 NOTE — Progress Notes (Signed)
Synopsis: Referred in April 2023 for abnormal CT chest findings by Brunetta Genera, MD  Subjective:   PATIENT ID: Meghan Alexander GENDER: female DOB: [birth year], MRN: [id number]  HPI  Chief Complaint  Patient presents with   Hospitalization Follow-up    HFU for hypoxia. Was discharged home with O2 but hasn't used it in the past 2 weeks. States her breathing has improved.    Meghan Alexander is a 66 year old woman, never smoker with allergies, achalasia, GERD, hypertension, DM II and follicular lymphoma who returns to pulmonary clinic for pneumonitis.   She was started on 14m prednisone daily on 4/19 which was reduced to 269mdaily on 5/2 prior to her ankle surgery on 09/03/21. She was admitted 5/15 to 5/17 for acute hypoxemic respiratory failure. She had an increase in bilateral opacities on CTA Chest scan 5/15. She was seen by inpatient pulmonary team with concern for peri-operative aspiration vs acute infiltrates from covid 19 infection on chronic interstitial findings. She was discharged on home O2 as she desatted with ambulation.  She has not been using oxygen over the past couple of weeks. She resumed 4061mf prednisone daily and is taking bactrim 3 days per week. She denies cough, joint pains or fevers. She continues to have night sweats. She reports her achalasia symptoms have significantly improved since taking steroids.   Initial OV 08/11/21 She was admitted 4/10 to 4/17 for acute respiratory failure with diffuse ground glass opacities noted on her CT Chest scan. She had progressive cough, night sweats, chills and dyspnea. She completed 3 courses of outpatient antibiotics without improvement. She underwent bronchoscopy with BAL of the RML and transbronchial brushings of the RLL on 4/11. BAL cell count was predominantly lymphocytic. Pathology did not indicate malignant cells on brushings. There was report of concern for actinomyces on path report. BAL culture grew strep mitis. Fungtitell  and quantiferon gold are negative. ID was consulted. Patient was treated with ceftriaxone and azithromycin and placed on augmentin for 14 day course and treated with high dose IV steroids and discharged on 5 days of prednisone 4m41mily. ESR was 75 on 08/02/21  She reports noticing improvement in her breathing after receiving the IV steroids. She did not require supplemental oxygen at time of discharge.   Upon chart review, she received her rituximab infusion on 02/25/21 and was seen again in Oncology clinic 05/11/21 where it was noted she had bronchitis and treated for pneumonia since the infusion. 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)[place]/11/2018   GERD (gastroesophageal reflux disease)    hx of reflux-went away with elevating HOB    Hypertension    Pneumonia    hx of    PONV (postoperative nausea and vomiting)    Type 2 diabetes mellitus (HCC)[place]10/2023     Family History  Problem Relation Age of Onset   Lung cancer Father        [id number] from it   Hernia Mother    Breast cancer Maternal Aunt        multiple    Breast cancer Maternal Grandmother    Breast cancer Paternal Grandmother  Breast cancer Cousin        cousins multiple    Colon cancer Neg Hx    Colon polyps Neg Hx    Rectal cancer Neg Hx    Stomach cancer Neg Hx      Social History   Socioeconomic History   Marital status: Married    Spouse name: Not on file   Number of children: Not on file   Years of education: Not on file   Highest education level: Not on file  Occupational History   Not on file  Tobacco Use   Smoking status: Never   Smokeless tobacco: Never  Vaping Use   Vaping Use: Never used  Substance and Sexual  Activity   Alcohol use: No    Alcohol/week: 0.0 standard drinks   Drug use: No   Sexual activity: Not on file  Other Topics Concern   Not on file  Social History Narrative   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 [employer]   Never smoker never drinker   No drugs   Multiple allergies   Lives at home with her husband of 8 years since 2008   Social Determinants of Health   Financial Resource Strain: Not on file  Food Insecurity: Not on file  Transportation Needs: Not on file  Physical Activity: Not on file  Stress: Not on file  Social Connections: Not on file  Intimate Partner Violence: Not on file     Allergies  Allergen Reactions   Latex Other (See Comments)    Blisters / skin peeling   Codeine Nausea And Vomiting and Other (See Comments)    hallucinations   Atenolol Cough   Other Other (See Comments)    DermaPlast? Used after surgery - caused swelling, itching, and wound broke open   Hydrocodone Nausea And Vomiting   Oxycodone Nausea And Vomiting     Outpatient Medications Prior to Visit  Medication Sig Dispense Refill   Accu-Chek Softclix Lancets lancets Use to test blood sugars up to 4 times daily as directed. 100 each 0   acetaminophen (TYLENOL) 325 MG tablet Take 2 tablets (650 mg total) by mouth every 8 (eight) hours as needed. 90 tablet 0   acyclovir (ZOVIRAX) 400 MG tablet TAKE 1 TABLET(400 MG) BY MOUTH DAILY (Patient taking differently: Take 400 mg by mouth daily.) 30 tablet 6   albuterol (VENTOLIN HFA) 108 (90 Base) MCG/ACT inhaler Inhale 2 puffs into the lungs daily as needed for shortness of breath.     amLODipine (NORVASC) 10 MG tablet Take 10 mg by mouth daily.     aspirin EC 81 MG tablet Take 1 tablet (81 mg total) by mouth 2 (two) times daily for 28 days. Swallow whole. 56 tablet 0   Blood Glucose Monitoring Suppl (BLOOD GLUCOSE MONITOR SYSTEM) w/Device KIT Use to test blood sugars up to 4 times daily. 1 kit 0   diphenhydrAMINE  (BENADRYL) 25 MG tablet Take 25 mg by mouth at bedtime.     feeding supplement (ENSURE ENLIVE / ENSURE PLUS) LIQD Take 237 mLs by mouth 3 (three) times daily between meals. 237 mL 12   ferrous sulfate 325 (65 FE) MG tablet Take 1 tablet (325 mg total) by mouth every other day. 30 tablet 0   glucose blood (ACCU-CHEK GUIDE) test strip Use to test blood sugars up to 4 times daily as directed. 100 each 0   HUMALOG KWIKPEN 100 UNIT/ML KwikPen Inject 4 Units into the skin  with breakfast, with lunch, and with evening meal. (Patient taking differently: Inject 4 Units into the skin with breakfast, with lunch, and with evening meal. 15 units with breakfast and lunch. 10 units before bed.) 15 mL 11   insulin detemir (LEVEMIR) 100 UNIT/ML FlexPen Inject 10 Units into the skin daily. (Patient taking differently: Inject 20 Units into the skin daily.) 15 mL 0   Insulin Pen Needle 32G X 4 MM MISC Use to inject insulin up to 4 times daily as directed. 100 each 0   ipratropium (ATROVENT) 0.03 % nasal spray Place 1 spray into both nostrils daily.     Ipratropium-Albuterol (COMBIVENT) 20-100 MCG/ACT AERS respimat Inhale 1 puff into the lungs every 6 (six) hours as needed for wheezing. 4 g 0   losartan (COZAAR) 50 MG tablet Take 50 mg by mouth daily.     Melatonin 10 MG CAPS Take 10 mg by mouth at bedtime.      metFORMIN (GLUCOPHAGE) 500 MG tablet Take 500 mg by mouth 2 (two) times daily with a meal.     metoprolol succinate (TOPROL-XL) 50 MG 24 hr tablet Take 50 mg by mouth daily.     Multiple Vitamin (MULTIVITAMIN) tablet Take 1 tablet by mouth daily.     Niacin (VITAMIN B-3 PO) Take 1 tablet by mouth daily.     omeprazole (PRILOSEC) 20 MG capsule Take 20 mg by mouth 2 (two) times daily.     predniSONE (DELTASONE) 20 MG tablet Take 1 tablet (20 mg total) by mouth daily with breakfast. (Patient taking differently: Take 40 mg by mouth daily with breakfast.) 60 tablet 1   rosuvastatin (CRESTOR) 10 MG tablet Take 10 mg by  mouth daily.     venlafaxine XR (EFFEXOR-XR) 37.5 MG 24 hr capsule Take 37.5 mg by mouth daily with breakfast.     sulfamethoxazole-trimethoprim (BACTRIM DS) 800-160 MG tablet Take 1 tablet by mouth 3 (three) times a week. (Patient taking differently: Take 1 tablet by mouth every Monday, Wednesday, and Friday.) 12 tablet 1   ferrous sulfate 325 (65 FE) MG tablet Take 1 tablet (325 mg total) by mouth every other day. 30 tablet 3   No facility-administered medications prior to visit.   Review of Systems  Constitutional:  Negative for chills, fever, malaise/fatigue and weight loss.  HENT:  Negative for congestion, sinus pain and sore throat.   Eyes: Negative.   Respiratory:  Negative for cough, hemoptysis, sputum production, shortness of breath and wheezing.   Cardiovascular:  Negative for chest pain, palpitations, orthopnea, claudication and leg swelling.  Gastrointestinal:  Negative for abdominal pain, heartburn, nausea and vomiting.  Genitourinary: Negative.   Musculoskeletal:  Negative for joint pain and myalgias.  Skin:  Negative for rash.  Neurological:  Negative for weakness.  Endo/Heme/Allergies: Negative.   Psychiatric/Behavioral: Negative.     Objective:   Vitals:   09/24/21 1529  BP: 116/70  Pulse: 90  SpO2: 97%  Height: _0  (1.575 m)    Physical Exam Constitutional:      General: She is not in acute distress.    Appearance: She is not ill-appearing.  HENT:     Head: Normocephalic and atraumatic.  Eyes:     General: No scleral icterus.    Conjunctiva/sclera: Conjunctivae normal.  Cardiovascular:     Rate and Rhythm: Normal rate and regular rhythm.     Pulses: Normal pulses.     Heart sounds: Normal heart sounds. No murmur heard. Pulmonary:  Effort: Pulmonary effort is normal.     Breath sounds: Normal breath sounds. No wheezing, rhonchi or rales.  Musculoskeletal:     Right lower leg: No edema.     Left lower leg: No edema.  Skin:    General: Skin is  warm and dry.  Neurological:     General: No focal deficit present.     Mental Status: She is alert.  Psychiatric:        Mood and Affect: Mood normal.        Behavior: Behavior normal.        Thought Content: Thought content normal.        Judgment: Judgment normal.   CBC    Component Value Date/Time   WBC 7.8 09/07/2021 0222   RBC 3.51 (L) 09/07/2021 0222   HGB 8.9 (L) 09/07/2021 0222   HGB 12.6 05/11/2021 1005   HCT 28.5 (L) 09/07/2021 0222   PLT 311 09/07/2021 0222   PLT 148 (L) 05/11/2021 1005   MCV 81.2 09/07/2021 0222   MCH 25.4 (L) 09/07/2021 0222   MCHC 31.2 09/07/2021 0222   RDW 16.5 (H) 09/07/2021 0222   LYMPHSABS 0.2 (L) 09/06/2021 0933   MONOABS 0.4 09/06/2021 0933   EOSABS 0.0 09/06/2021 0933   BASOSABS 0.0 09/06/2021 0933      Latest Ref Rng & Units 09/08/2021    6:41 AM 09/07/2021    2:22 AM 09/06/2021    9:33 AM  BMP  Glucose 70 - 99 mg/dL 162   206   113    BUN 8 - 23 mg/dL _0 Creatinine 0.44 - 1.00 mg/dL 0.76   0.77   0.85    Sodium 135 - 145 mmol/L 140   133   140    Potassium 3.5 - 5.1 mmol/L 3.8   3.4   3.2    Chloride 98 - 111 mmol/L 98   93   97    CO2 22 - 32 mmol/L 33   30   31    Calcium 8.9 - 10.3 mg/dL 9.0   8.3   8.5     Chest imaging: CTA Chest 09/06/21 Mediastinum/Nodes: No enlarged mediastinal, hilar, or axillary lymph nodes. Thyroid gland, and trachea demonstrate no significant findings. As before, again seen is the moderately large fluid-filled dilatation of the esophagus consistent with history of achalasia.   Lungs/Pleura: There are confluent patchy opacities seen throughout the lungs and are more prominent in the upper lobes and have significantly progressed in the interim. Mild bibasilar pleural thickening and was present on the previous study as well.  CXR 09/06/21 Patchy ill-defined nodular appearing areas are noted throughout the lungs bilaterally. In addition, there is more diffuse airspace consolidation  throughout the left mid to lower lung. No definite pleural effusions. No pneumothorax. No evidence of pulmonary edema. Heart size is normal. Upper mediastinal contours are within normal limits. Atherosclerotic calcifications are noted in the thoracic aorta.  CT 08/02/21 No definite evidence of pulmonary embolus. Continued presence patchy airspace opacities throughout both lungs which may be slightly more prominent compared to prior exam, concerning for multifocal pneumonia. Hepatic steatosis. Stable dilated esophagus is noted consistent with history of achalasia. Aortic Atherosclerosis  CT 07/14/21 Mediastinum/Nodes: No discrete thyroid nodule. No pathologically enlarged mediastinal, hilar or axillary lymph nodes. Patulous thoracic esophagus with retained versus refluxed contrast material in the esophagus and mild diffuse esophageal wall thickening.   Lungs/Pleura: Extensive multifocal  bilateral ground-glass opacities with interstitial thickening, favored to reflect an infectious or inflammatory etiology. No pleural effusion. No pneumothorax.  PFT:     View : No data to display.          Labs:  Path:  Echo:  Heart Catheterization:    Assessment & Plan:   ILD (interstitial lung disease) ([place]) - Plan: DG Chest 2 View, MyoMarker 3 Plus Profile (RDL), Rheumatoid factor, RNP Antibodies, Cyclic citrul peptide antibody, IgG, Anti-scleroderma antibody, ANA, ANCA screen with reflex titer, Anti-DNA antibody, double-stranded, Anti-Smith antibody, Centromere Antibodies, Sjogren's syndrome antibods(ssa + ssb), Sedimentation rate, Ambulatory Referral for DME  Pneumonitis - Plan: DG Chest 2 View, sulfamethoxazole-trimethoprim (BACTRIM DS) 800-160 MG tablet, predniSONE (DELTASONE) 10 MG tablet  Achalasia  Discussion: Laketa Sandoz is a 66 year old woman, never smoker with allergies, achalasia, GERD, hypertension, DM II and follicular lymphoma who returns to pulmonary clinic for  hospital follow up after acute respiratory failure.   Her recent hospitalization may have been due to covid 19 pneumonia on top of pneumonitis from rituximab vs flare in disease with drop in steroid or aspiration event peri-operatively.   She has recovered well from the admission with increasing her steroids to 33m daily. We will begin to taper her steroids by 1110mevery 2 weeks.   307maily 6/3 - 6/17 61m42mily 6/18 - 7/2 10mg65mly 7/3 - 7/17  She is to continue taking bactrim through 7/3.   It is surprising that her achalasia symptoms have improved with steroids, but this would be the case in eosinophilic esophagitis contributing to her achalasia. We will continue to monitor for any changes in her esophageal symptoms.   We will check inflammatory labs today as these have not been checked in the past.  Follow up in 6 weeks  [facility]: [phone] Current Outpatient Medications:    Accu-Chek Softclix Lancets lancets, Use to test blood sugars up to 4 times daily as directed., Disp: 100 each, Rfl: 0   acetaminophen (TYLENOL) 325 MG tablet, Take 2 tablets (650 mg total) by mouth every 8 (eight) hours as needed., Disp: 90 tablet, Rfl: 0   acyclovir (ZOVIRAX) 400 MG tablet, TAKE 1 TABLET(400 MG) BY MOUTH DAILY (Patient taking differently: Take 400 mg by mouth daily.), Disp: 30 tablet, Rfl: 6   albuterol (VENTOLIN HFA) 108 (90 Base) MCG/ACT inhaler, Inhale 2 puffs into the lungs daily as needed for shortness of breath., Disp: , Rfl:    amLODipine (NORVASC) 10 MG tablet, Take 10 mg by mouth daily., Disp: , Rfl:    aspirin EC 81 MG tablet, Take 1 tablet (81 mg total) by mouth 2 (two) times daily for 28 days. Swallow whole., Disp: 56 tablet, Rfl: 0   Blood Glucose Monitoring Suppl (BLOOD GLUCOSE MONITOR SYSTEM) w/Device KIT, Use to test blood sugars up to 4 times daily., Disp: 1 kit, Rfl: 0   diphenhydrAMINE (BENADRYL) 25 MG  tablet, Take 25 mg by mouth at bedtime., Disp: , Rfl:    feeding supplement (ENSURE ENLIVE / ENSURE PLUS) LIQD, Take 237 mLs by mouth 3 (three) times daily between meals., Disp: 237 mL, Rfl: 12   ferrous sulfate 325 (65 FE) MG tablet, Take 1 tablet (325 mg total) by mouth every other day., Disp: 30 tablet, Rfl: 0   glucose blood (ACCU-CHEK GUIDE) test strip, Use to test blood sugars up to 4 times daily as directed., Disp: 100 each,  Rfl: 0   HUMALOG KWIKPEN 100 UNIT/ML KwikPen, Inject 4 Units into the skin with breakfast, with lunch, and with evening meal. (Patient taking differently: Inject 4 Units into the skin with breakfast, with lunch, and with evening meal. 15 units with breakfast and lunch. 10 units before bed.), Disp: 15 mL, Rfl: 11   insulin detemir (LEVEMIR) 100 UNIT/ML FlexPen, Inject 10 Units into the skin daily. (Patient taking differently: Inject 20 Units into the skin daily.), Disp: 15 mL, Rfl: 0   Insulin Pen Needle 32G X 4 MM MISC, Use to inject insulin up to 4 times daily as directed., Disp: 100 each, Rfl: 0   ipratropium (ATROVENT) 0.03 % nasal spray, Place 1 spray into both nostrils daily., Disp: , Rfl:    Ipratropium-Albuterol (COMBIVENT) 20-100 MCG/ACT AERS respimat, Inhale 1 puff into the lungs every 6 (six) hours as needed for wheezing., Disp: 4 g, Rfl: 0   losartan (COZAAR) 50 MG tablet, Take 50 mg by mouth daily., Disp: , Rfl:    Melatonin 10 MG CAPS, Take 10 mg by mouth at bedtime. , Disp: , Rfl:    metFORMIN (GLUCOPHAGE) 500 MG tablet, Take 500 mg by mouth 2 (two) times daily with a meal., Disp: , Rfl:    metoprolol succinate (TOPROL-XL) 50 MG 24 hr tablet, Take 50 mg by mouth daily., Disp: , Rfl:    Multiple Vitamin (MULTIVITAMIN) tablet, Take 1 tablet by mouth daily., Disp: , Rfl:    Niacin (VITAMIN B-3 PO), Take 1 tablet by mouth daily., Disp: , Rfl:    omeprazole (PRILOSEC) 20 MG capsule, Take 20 mg by mouth 2 (two) times daily., Disp: , Rfl:    predniSONE (DELTASONE)  10 MG tablet, Take 1 tablet (10 mg total) by mouth daily with breakfast., Disp: 30 tablet, Rfl: 2   predniSONE (DELTASONE) 20 MG tablet, Take 1 tablet (20 mg total) by mouth daily with breakfast. (Patient taking differently: Take 40 mg by mouth daily with breakfast.), Disp: 60 tablet, Rfl: 1   rosuvastatin (CRESTOR) 10 MG tablet, Take 10 mg by mouth daily., Disp: , Rfl:    venlafaxine XR (EFFEXOR-XR) 37.5 MG 24 hr capsule, Take 37.5 mg by mouth daily with breakfast., Disp: , Rfl:    sulfamethoxazole-trimethoprim (BACTRIM DS) 800-160 MG tablet, Take 1 tablet by mouth every Monday, Wednesday, and Friday., Disp: 16 tablet, Rfl: 0

## 2021-10-07 ENCOUNTER — Other Ambulatory Visit: Payer: Self-pay | Admitting: Hematology

## 2021-10-08 ENCOUNTER — Inpatient Hospital Stay (HOSPITAL_COMMUNITY)
Admission: EM | Admit: 2021-10-08 | Discharge: 2021-10-11 | DRG: 309 | Disposition: A | Discharge status: Home / Self Care | Payer: Medicare Other | Attending: Family Medicine | Admitting: Family Medicine

## 2021-10-08 ENCOUNTER — Encounter (HOSPITAL_COMMUNITY): Payer: Self-pay

## 2021-10-08 ENCOUNTER — Other Ambulatory Visit: Payer: Self-pay

## 2021-10-08 ENCOUNTER — Inpatient Hospital Stay (HOSPITAL_COMMUNITY): Payer: Medicare Other

## 2021-10-08 ENCOUNTER — Emergency Department (HOSPITAL_COMMUNITY): Payer: Medicare Other

## 2021-10-08 ENCOUNTER — Other Ambulatory Visit (HOSPITAL_COMMUNITY): Payer: Self-pay

## 2021-10-08 DIAGNOSIS — D72829 Elevated white blood cell count, unspecified: Secondary | ICD-10-CM

## 2021-10-08 DIAGNOSIS — Y92239 Unspecified place in hospital as the place of occurrence of the external cause: Secondary | ICD-10-CM | POA: Diagnosis present

## 2021-10-08 DIAGNOSIS — T383X5A Adverse effect of insulin and oral hypoglycemic [antidiabetic] drugs, initial encounter: Secondary | ICD-10-CM | POA: Diagnosis present

## 2021-10-08 DIAGNOSIS — J189 Pneumonia, unspecified organism: Secondary | ICD-10-CM | POA: Diagnosis present

## 2021-10-08 DIAGNOSIS — Z801 Family history of malignant neoplasm of trachea, bronchus and lung: Secondary | ICD-10-CM

## 2021-10-08 DIAGNOSIS — D849 Immunodeficiency, unspecified: Secondary | ICD-10-CM | POA: Diagnosis present

## 2021-10-08 DIAGNOSIS — X58XXXD Exposure to other specified factors, subsequent encounter: Secondary | ICD-10-CM | POA: Diagnosis present

## 2021-10-08 DIAGNOSIS — Z794 Long term (current) use of insulin: Secondary | ICD-10-CM

## 2021-10-08 DIAGNOSIS — I480 Paroxysmal atrial fibrillation: Secondary | ICD-10-CM | POA: Diagnosis present

## 2021-10-08 DIAGNOSIS — U071 COVID-19: Secondary | ICD-10-CM | POA: Diagnosis not present

## 2021-10-08 DIAGNOSIS — Z7951 Long term (current) use of inhaled steroids: Secondary | ICD-10-CM

## 2021-10-08 DIAGNOSIS — Z79890 Hormone replacement therapy (postmenopausal): Secondary | ICD-10-CM | POA: Diagnosis not present

## 2021-10-08 DIAGNOSIS — D72823 Leukemoid reaction: Secondary | ICD-10-CM | POA: Diagnosis present

## 2021-10-08 DIAGNOSIS — S8251XD Displaced fracture of medial malleolus of right tibia, subsequent encounter for closed fracture with routine healing: Secondary | ICD-10-CM | POA: Diagnosis not present

## 2021-10-08 DIAGNOSIS — E162 Hypoglycemia, unspecified: Secondary | ICD-10-CM

## 2021-10-08 DIAGNOSIS — J849 Interstitial pulmonary disease, unspecified: Secondary | ICD-10-CM | POA: Diagnosis present

## 2021-10-08 DIAGNOSIS — I10 Essential (primary) hypertension: Secondary | ICD-10-CM

## 2021-10-08 DIAGNOSIS — K219 Gastro-esophageal reflux disease without esophagitis: Secondary | ICD-10-CM | POA: Diagnosis present

## 2021-10-08 DIAGNOSIS — J1282 Pneumonia due to coronavirus disease 2019: Secondary | ICD-10-CM | POA: Diagnosis not present

## 2021-10-08 DIAGNOSIS — E1165 Type 2 diabetes mellitus with hyperglycemia: Secondary | ICD-10-CM | POA: Diagnosis present

## 2021-10-08 DIAGNOSIS — R9431 Abnormal electrocardiogram [ECG] [EKG]: Secondary | ICD-10-CM | POA: Diagnosis not present

## 2021-10-08 DIAGNOSIS — K220 Achalasia of cardia: Secondary | ICD-10-CM | POA: Diagnosis present

## 2021-10-08 DIAGNOSIS — Z885 Allergy status to narcotic agent status: Secondary | ICD-10-CM | POA: Diagnosis not present

## 2021-10-08 DIAGNOSIS — Z6830 Body mass index (BMI) 30.0-30.9, adult: Secondary | ICD-10-CM

## 2021-10-08 DIAGNOSIS — E11649 Type 2 diabetes mellitus with hypoglycemia without coma: Secondary | ICD-10-CM | POA: Diagnosis present

## 2021-10-08 DIAGNOSIS — T380X5A Adverse effect of glucocorticoids and synthetic analogues, initial encounter: Secondary | ICD-10-CM | POA: Diagnosis present

## 2021-10-08 DIAGNOSIS — I4891 Unspecified atrial fibrillation: Secondary | ICD-10-CM | POA: Diagnosis not present

## 2021-10-08 DIAGNOSIS — R000 Tachycardia, unspecified: Secondary | ICD-10-CM | POA: Diagnosis present

## 2021-10-08 DIAGNOSIS — S82891A Other fracture of right lower leg, initial encounter for closed fracture: Secondary | ICD-10-CM | POA: Diagnosis present

## 2021-10-08 DIAGNOSIS — Z7952 Long term (current) use of systemic steroids: Secondary | ICD-10-CM | POA: Diagnosis not present

## 2021-10-08 DIAGNOSIS — Z8572 Personal history of non-Hodgkin lymphomas: Secondary | ICD-10-CM

## 2021-10-08 DIAGNOSIS — F32A Depression, unspecified: Secondary | ICD-10-CM | POA: Diagnosis present

## 2021-10-08 DIAGNOSIS — Z79899 Other long term (current) drug therapy: Secondary | ICD-10-CM

## 2021-10-08 DIAGNOSIS — Z8616 Personal history of COVID-19: Secondary | ICD-10-CM | POA: Diagnosis not present

## 2021-10-08 DIAGNOSIS — E785 Hyperlipidemia, unspecified: Secondary | ICD-10-CM

## 2021-10-08 DIAGNOSIS — Z803 Family history of malignant neoplasm of breast: Secondary | ICD-10-CM

## 2021-10-08 DIAGNOSIS — S82831D Other fracture of upper and lower end of right fibula, subsequent encounter for closed fracture with routine healing: Secondary | ICD-10-CM | POA: Diagnosis not present

## 2021-10-08 DIAGNOSIS — Z888 Allergy status to other drugs, medicaments and biological substances status: Secondary | ICD-10-CM

## 2021-10-08 DIAGNOSIS — E669 Obesity, unspecified: Secondary | ICD-10-CM | POA: Diagnosis present

## 2021-10-08 DIAGNOSIS — Z91040 Latex allergy status: Secondary | ICD-10-CM

## 2021-10-08 DIAGNOSIS — Z90710 Acquired absence of both cervix and uterus: Secondary | ICD-10-CM

## 2021-10-08 DIAGNOSIS — E1110 Type 2 diabetes mellitus with ketoacidosis without coma: Principal | ICD-10-CM

## 2021-10-08 LAB — RESPIRATORY PANEL BY PCR

## 2021-10-08 LAB — CBC
HCT: 39.7 % (ref 36.0–46.0)
Hemoglobin: 12 g/dL (ref 12.0–15.0)
MCH: 24.6 pg — ABNORMAL LOW (ref 26.0–34.0)
MCHC: 30.2 g/dL (ref 30.0–36.0)
MCV: 81.4 fL (ref 80.0–100.0)
Platelets: 268 10*3/uL (ref 150–400)
RBC: 4.88 MIL/uL (ref 3.87–5.11)
RDW: 16.9 % — ABNORMAL HIGH (ref 11.5–15.5)
WBC: 16.6 10*3/uL — ABNORMAL HIGH (ref 4.0–10.5)
nRBC: 0 % (ref 0.0–0.2)

## 2021-10-08 LAB — COMPREHENSIVE METABOLIC PANEL
ALT: 29 U/L (ref 0–44)
AST: 27 U/L (ref 15–41)
Albumin: 3.4 g/dL — ABNORMAL LOW (ref 3.5–5.0)
Alkaline Phosphatase: 81 U/L (ref 38–126)
Anion gap: 15 (ref 5–15)
BUN: 18 mg/dL (ref 8–23)
CO2: 26 mmol/L (ref 22–32)
Calcium: 9.9 mg/dL (ref 8.9–10.3)
Chloride: 100 mmol/L (ref 98–111)
Creatinine, Ser: 0.91 mg/dL (ref 0.44–1.00)
GFR, Estimated: >60 mL/min (ref >60)
Glucose, Bld: 113 mg/dL — ABNORMAL HIGH (ref 70–99)
Potassium: 4.2 mmol/L (ref 3.5–5.1)
Sodium: 141 mmol/L (ref 135–145)
Total Bilirubin: 0.5 mg/dL (ref 0.3–1.2)
Total Protein: 6.9 g/dL (ref 6.5–8.1)

## 2021-10-08 LAB — GLUCOSE, CAPILLARY
Glucose-Capillary: 185 mg/dL — ABNORMAL HIGH (ref 70–99)
Glucose-Capillary: 322 mg/dL — ABNORMAL HIGH (ref 70–99)
Glucose-Capillary: 324 mg/dL — ABNORMAL HIGH (ref 70–99)
Glucose-Capillary: 263 mg/dL — ABNORMAL HIGH (ref 70–99)

## 2021-10-08 LAB — TROPONIN I (HIGH SENSITIVITY)
Troponin I (High Sensitivity): 9 ng/L (ref <18)
Troponin I (High Sensitivity): 16 ng/L (ref <18)

## 2021-10-08 LAB — ECHOCARDIOGRAM COMPLETE
Height: 62 in
S' Lateral: 2.7 cm
Weight: 2560 oz

## 2021-10-08 LAB — CBG MONITORING, ED
Glucose-Capillary: 85 mg/dL (ref 70–99)
Glucose-Capillary: 159 mg/dL — ABNORMAL HIGH (ref 70–99)
Glucose-Capillary: 162 mg/dL — ABNORMAL HIGH (ref 70–99)
Glucose-Capillary: 142 mg/dL — ABNORMAL HIGH (ref 70–99)
Glucose-Capillary: 220 mg/dL — ABNORMAL HIGH (ref 70–99)
Glucose-Capillary: 200 mg/dL — ABNORMAL HIGH (ref 70–99)

## 2021-10-08 LAB — SEDIMENTATION RATE: Sed Rate: 88 mm/hr — ABNORMAL HIGH (ref 0–22)

## 2021-10-08 LAB — C-REACTIVE PROTEIN: CRP: 6 mg/dL — ABNORMAL HIGH (ref <1.0)

## 2021-10-08 MED ORDER — ACETAMINOPHEN 325 MG PO TABS
650.0000 mg | ORAL_TABLET | Freq: Four times a day (QID) | ORAL | Status: DC | PRN
  Administered 2021-10-08: 650 mg via ORAL
  Filled 2021-10-08: qty 2

## 2021-10-08 MED ORDER — CEFTRIAXONE SODIUM 1 G IJ SOLR
1.0000 g | Freq: Once | INTRAMUSCULAR | Status: AC
  Administered 2021-10-08: 1 g via INTRAVENOUS
  Filled 2021-10-08: qty 10

## 2021-10-08 MED ORDER — DILTIAZEM HCL-DEXTROSE 125-5 MG/125ML-% IV SOLN (PREMIX)
5.0000 mg/h | INTRAVENOUS | Status: DC
  Administered 2021-10-08: 5 mg/h via INTRAVENOUS
  Filled 2021-10-08: qty 125

## 2021-10-08 MED ORDER — SULFAMETHOXAZOLE-TRIMETHOPRIM 800-160 MG PO TABS
1.0000 | ORAL_TABLET | ORAL | Status: DC
  Administered 2021-10-08 – 2021-10-11 (×2): 1 via ORAL
  Filled 2021-10-08 (×2): qty 1

## 2021-10-08 MED ORDER — SODIUM CHLORIDE 0.9 % IV SOLN: 1.0000 g | INTRAVENOUS | Status: DC

## 2021-10-08 MED ORDER — ALBUTEROL SULFATE (2.5 MG/3ML) 0.083% IN NEBU: 2.5000 mg | INHALATION_SOLUTION | RESPIRATORY_TRACT | Status: DC | PRN

## 2021-10-08 MED ORDER — PREDNISONE 20 MG PO TABS
30.0000 mg | ORAL_TABLET | Freq: Every day | ORAL | Status: AC
  Administered 2021-10-09: 30 mg via ORAL
  Filled 2021-10-08: qty 1

## 2021-10-08 MED ORDER — INSULIN GLARGINE-YFGN 100 UNIT/ML ~~LOC~~ SOLN
10.0000 [IU] | Freq: Every day | SUBCUTANEOUS | Status: DC
  Administered 2021-10-08 – 2021-10-10 (×3): 10 [IU] via SUBCUTANEOUS
  Filled 2021-10-08 (×6): qty 0.1

## 2021-10-08 MED ORDER — AMLODIPINE BESYLATE 10 MG PO TABS
10.0000 mg | ORAL_TABLET | Freq: Every day | ORAL | Status: DC
  Administered 2021-10-08 – 2021-10-09 (×2): 10 mg via ORAL
  Filled 2021-10-08: qty 2
  Filled 2021-10-08: qty 1

## 2021-10-08 MED ORDER — SODIUM CHLORIDE 0.9 % IV SOLN
500.0000 mg | Freq: Once | INTRAVENOUS | Status: AC
  Administered 2021-10-08: 500 mg via INTRAVENOUS
  Filled 2021-10-08: qty 5

## 2021-10-08 MED ORDER — ASPIRIN 81 MG PO CHEW
324.0000 mg | CHEWABLE_TABLET | Freq: Once | ORAL | Status: AC
Start: 2021-10-08 — End: 2021-10-08
  Administered 2021-10-08: 324 mg via ORAL
  Filled 2021-10-08: qty 4

## 2021-10-08 MED ORDER — AZITHROMYCIN 250 MG PO TABS: 500.0000 mg | ORAL_TABLET | Freq: Every day | ORAL | Status: DC

## 2021-10-08 MED ORDER — PREDNISONE 20 MG PO TABS
40.0000 mg | ORAL_TABLET | Freq: Every day | ORAL | Status: DC
  Administered 2021-10-08: 40 mg via ORAL
  Filled 2021-10-08: qty 2

## 2021-10-08 MED ORDER — INSULIN ASPART 100 UNIT/ML IJ SOLN
0.0000 [IU] | Freq: Three times a day (TID) | INTRAMUSCULAR | Status: DC
  Administered 2021-10-08: 11 [IU] via SUBCUTANEOUS
  Administered 2021-10-08: 5 [IU] via SUBCUTANEOUS
  Administered 2021-10-08: 3 [IU] via SUBCUTANEOUS
  Administered 2021-10-09: 11 [IU] via SUBCUTANEOUS
  Administered 2021-10-09: 3 [IU] via SUBCUTANEOUS
  Administered 2021-10-10: 2 [IU] via SUBCUTANEOUS

## 2021-10-08 MED ORDER — LOSARTAN POTASSIUM 50 MG PO TABS
50.0000 mg | ORAL_TABLET | Freq: Every day | ORAL | Status: DC
  Administered 2021-10-08 – 2021-10-11 (×4): 50 mg via ORAL
  Filled 2021-10-08 (×4): qty 1

## 2021-10-08 MED ORDER — DILTIAZEM HCL-DEXTROSE 125-5 MG/125ML-% IV SOLN (PREMIX)
5.0000 mg/h | INTRAVENOUS | Status: DC
  Administered 2021-10-08 – 2021-10-10 (×5): 10 mg/h via INTRAVENOUS
  Filled 2021-10-08 (×4): qty 125

## 2021-10-08 MED ORDER — ACETAMINOPHEN 650 MG RE SUPP: 650.0000 mg | Freq: Four times a day (QID) | RECTAL | Status: DC | PRN

## 2021-10-08 MED ORDER — IOHEXOL 350 MG/ML SOLN
50.0000 mL | Freq: Once | INTRAVENOUS | Status: AC | PRN
  Administered 2021-10-08: 50 mL via INTRAVENOUS

## 2021-10-08 MED ORDER — AZITHROMYCIN 250 MG PO TABS
500.0000 mg | ORAL_TABLET | Freq: Every day | ORAL | Status: AC
  Administered 2021-10-09 – 2021-10-10 (×2): 500 mg via ORAL
  Filled 2021-10-08 (×2): qty 2

## 2021-10-08 MED ORDER — ACYCLOVIR 400 MG PO TABS
400.0000 mg | ORAL_TABLET | Freq: Every day | ORAL | Status: DC
  Administered 2021-10-08 – 2021-10-11 (×4): 400 mg via ORAL
  Filled 2021-10-08 (×4): qty 1

## 2021-10-08 MED ORDER — ROSUVASTATIN CALCIUM 5 MG PO TABS
10.0000 mg | ORAL_TABLET | Freq: Every day | ORAL | Status: DC
  Administered 2021-10-08 – 2021-10-11 (×4): 10 mg via ORAL
  Filled 2021-10-08 (×4): qty 2

## 2021-10-08 MED ORDER — VENLAFAXINE HCL ER 37.5 MG PO CP24
37.5000 mg | ORAL_CAPSULE | Freq: Every day | ORAL | Status: DC
  Administered 2021-10-08 – 2021-10-11 (×4): 37.5 mg via ORAL
  Filled 2021-10-08 (×5): qty 1

## 2021-10-08 MED ORDER — APIXABAN 5 MG PO TABS
5.0000 mg | ORAL_TABLET | Freq: Two times a day (BID) | ORAL | Status: DC
  Administered 2021-10-08 – 2021-10-11 (×7): 5 mg via ORAL
  Filled 2021-10-08 (×7): qty 1

## 2021-10-08 MED ORDER — SODIUM CHLORIDE 0.9 % IV SOLN
2.0000 g | INTRAVENOUS | Status: DC
  Administered 2021-10-09 – 2021-10-11 (×3): 2 g via INTRAVENOUS
  Filled 2021-10-08 (×3): qty 20

## 2021-10-08 MED ORDER — PANTOPRAZOLE SODIUM 40 MG PO TBEC
40.0000 mg | DELAYED_RELEASE_TABLET | Freq: Every day | ORAL | Status: DC
  Administered 2021-10-08 – 2021-10-11 (×4): 40 mg via ORAL
  Filled 2021-10-08 (×4): qty 1

## 2021-10-08 NOTE — Assessment & Plan Note (Addendum)
Patient with DM2 on home lantus 20U daily and recently increased meal time coverage of 15U TID AC. Patient was hypoglycemic to 40s per EMS report. In ED, CBGs appropriate to 159 after eating food. Patient has had hyperglycemia in recent weeks due to prednisone steroid usage.  - CBG TID AC QHS - Semglee 10U - SSI moderate - Consider adding prandial coverage if uncontrolled - Carb modified diet

## 2021-10-08 NOTE — Consult Note (Signed)
[facility]  Total days of antibiotics 2/ceftiraxone and azithro  Reason for Consult:immunocompromised host with recurrent pneumonia   Referring Physician: chambliss  Principal Problem:   Atrial fibrillation with RVR ([place]) Active Problems:   CAP (community acquired pneumonia)   Achalasia   Uncontrolled type 2 diabetes mellitus with hyperglycemia ([place])   Closed right ankle fracture    HPI: Ayala Ribble is a 66 y.o. female with hx of follicular lymphoma in remission, HTN, DM2, ILD on pred '40mg'$  with bactrim oi proph followed by dr dewald in pulm clinic. This is her 3rd hospitalization in recent months. Treated for streptococcal pneumonia in April, re admitted in May for acute respiratory failure 2/2 COVID, where she was discharged on 5/17 on pred '20mg'$  daily and TIW bactrim. On follow up in early June with Dr Erin Fulling, she was no longer using oxygen, but increased pred up to '40mg'$  daily. She was instructed to start pred taper down by '10mg'$  per 2 wk for which she is on '30mg'$  daily, and her Last rituximab infusion in Nov 2022.  She reports that she had a change in her insulin regimen, increased doses that just started 2 days prior to admission, the night prior to admission she started to have chills but did not check BS at that time. Then on admission, had significant diaphoresis, chills, fever in the setting of hypoglycemia. She denies cough.  EMS brought patient in for low blood sugar in setting of 42. EMS found her to have elevated HR at 145 with afib with rvr with associated shortness of breath. She was found to have leukocytosis. Chest cxr showed bilateral patchy infiltrate similar to prior cxr per my read ,and chest ct showing fluctuating GGO as have seen on recent scans.   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 ([place]) 04/02/2019   GERD (gastroesophageal reflux disease)    hx of reflux-went away with elevating HOB    Hypertension    Pneumonia    hx of    PONV (postoperative nausea and vomiting)    Type 2 diabetes mellitus ([place]) 08/02/2021    Allergies:  Allergies  Allergen Reactions   Latex Other (See Comments)    Blisters / skin peeling   Codeine Nausea And Vomiting and Other (See Comments)    hallucinations   Atenolol Cough   Other Other (See Comments)    DermaPlast; Used after surgery - caused swelling, itching, and wound broke open   Hydrocodone Nausea And Vomiting   Oxycodone Nausea And Vomiting    MEDICATIONS:  acyclovir  400 mg Oral Daily   amLODipine  10 mg Oral Daily   apixaban  5 mg Oral BID   [START ON 10/09/2021] azithromycin  500 mg Oral Daily   insulin aspart  0-15 Units Subcutaneous TID WC   insulin glargine-yfgn  10 Units Subcutaneous QHS   losartan  50 mg Oral Daily   pantoprazole  40 mg Oral Daily   predniSONE  40 mg Oral Q breakfast   rosuvastatin  10 mg Oral Daily   venlafaxine XR  37.5 mg Oral Q breakfast    Social History  Tobacco Use   Smoking status: Never   Smokeless tobacco: Never  Vaping Use   Vaping Use: Never used  Substance Use Topics   Alcohol use: No    Alcohol/week: 0.0 standard drinks of alcohol   Drug use: No    Family History  Problem Relation Age of Onset   Lung cancer Father        1980/07/25 died from it   Hernia Mother    Breast cancer Maternal Aunt        multiple    Breast cancer Maternal Grandmother    Breast cancer Paternal Grandmother    Breast cancer Cousin        cousins multiple    Colon cancer Neg Hx    Colon polyps Neg Hx    Rectal cancer Neg Hx    Stomach cancer Neg Hx     Review of Systems  Constitutional: Negative for fever,+ chills, +diaphoresis, activity change, appetite  change, fatigue and unexpected weight change.  HENT: Negative for congestion, sore throat, rhinorrhea, sneezing, trouble swallowing and sinus pressure.  Eyes: Negative for photophobia and visual disturbance.  Respiratory: Negative for cough, chest tightness, shortness of breath, wheezing and stridor.  Cardiovascular: Negative for chest pain, palpitations and leg swelling.  Gastrointestinal: Negative for nausea, vomiting, abdominal pain, diarrhea, constipation, blood in stool, abdominal distention and anal bleeding.  Genitourinary: Negative for dysuria, hematuria, flank pain and difficulty urinating.  Musculoskeletal: Negative for myalgias, back pain, joint swelling, arthralgias and gait problem.  Skin: Negative for color change, pallor, rash and wound.  Neurological: Negative for dizziness, tremors, weakness and light-headedness.  Hematological: Negative for adenopathy. Does not bruise/bleed easily.  Psychiatric/Behavioral: Negative for behavioral problems, confusion, sleep disturbance, dysphoric mood, decreased concentration and agitation.    OBJECTIVE: Temp:  [97.8 F (36.6 C)-98.5 F (36.9 C)] 98.3 F (36.8 C) (06/16 1158) Pulse Rate:  [34-146] 102 (06/16 1158) Resp:  [14-42] 19 (06/16 1158) BP: (91-153)/(66-103) 123/90 (06/16 1158) SpO2:  [90 %-98 %] 95 % (06/16 1158) Weight:  [72.6 kg-75.7 kg] 75.7 kg (06/16 1115) Physical Exam  Constitutional:  oriented to person, place, and time. appears well-developed and well-nourished. No distress.  HENT: [place]/AT, PERRLA, no scleral icterus Mouth/Throat: Oropharynx is clear and moist. No oropharyngeal exudate.  Cardiovascular: Normal rate, regular rhythm and normal heart sounds. Exam reveals no gallop and no friction rub.  No murmur heard.  Pulmonary/Chest: Effort normal and breath sounds normal. No respiratory distress.  has no wheezes.  Neck = supple, no nuchal rigidity Abdominal: Soft. Bowel sounds are normal.  exhibits no distension. There  is no tenderness.  Lymphadenopathy: no cervical adenopathy. No axillary adenopathy Neurological: alert and oriented to person, place, and time.  Skin: Skin is warm and dry. No rash noted. No erythema.  Psychiatric: a normal mood and affect.  behavior is normal.     LABS: Results for orders placed or performed during the hospital encounter of 10/08/21 (from the past 48 hour(s))  Troponin I (High Sensitivity)     Status: None   Collection Time: 10/08/21  5:07 AM  Result Value Ref Range   Troponin I (High Sensitivity) 9 <18 ng/L    Comment: (NOTE) Elevated high sensitivity troponin I (hsTnI) values and significant  changes across serial measurements may suggest ACS but many other  chronic and acute conditions are known to elevate hsTnI results.  Refer to the "Links" section for chest pain algorithms and additional  guidance. Performed at [facility], [address] [place], [place] [address]   CBC     Status: Abnormal   Collection Time: 10/08/21  5:07 AM  Result Value Ref Range   WBC 16.6 (H) 4.0 - 10.5 K/uL   RBC 4.88 3.87 - 5.11 MIL/uL   Hemoglobin 12.0 12.0 - 15.0 g/dL   HCT 39.7 36.0 - 46.0 %   MCV 81.4 80.0 - 100.0 fL   MCH 24.6 (L) 26.0 - 34.0 pg   MCHC 30.2 30.0 - 36.0 g/dL   RDW 16.9 (H) 11.5 - 15.5 %   Platelets 268 150 - 400 K/uL   nRBC 0.0 0.0 - 0.2 %    Comment: Performed at [facility], [place] [address] [place], [place] [address]  Comprehensive metabolic panel     Status: Abnormal   Collection Time: 10/08/21  5:07 AM  Result Value Ref Range   Sodium 141 135 - 145 mmol/L   Potassium 4.2 3.5 - 5.1 mmol/L   Chloride 100 98 - 111 mmol/L   CO2 26 22 - 32 mmol/L   Glucose, Bld 113 (H) 70 - 99 mg/dL    Comment: Glucose reference range applies only to samples taken after fasting for at least 8 hours.   BUN 18 8 - 23 mg/dL   Creatinine, Ser 0.91 0.44 - 1.00 mg/dL   Calcium 9.9 8.9 - 10.3 mg/dL   Total Protein 6.9 6.5 - 8.1 g/dL   Albumin 3.4 (L) 3.5 -  5.0 g/dL   AST 27 15 - 41 U/L   ALT 29 0 - 44 U/L   Alkaline Phosphatase 81 38 - 126 U/L   Total Bilirubin 0.5 0.3 - 1.2 mg/dL   GFR, Estimated >60 >60 mL/min    Comment: (NOTE) Calculated using the CKD-EPI Creatinine Equation (2021)    Anion gap 15 5 - 15    Comment: Performed at [facility] [address] [place], [place] [address]  CBG monitoring, ED     Status: None   Collection Time: 10/08/21  5:23 AM  Result Value Ref Range   Glucose-Capillary 85 70 - 99 mg/dL    Comment: Glucose reference range applies only to samples taken after fasting for at least 8 hours.  Troponin I (High Sensitivity)     Status: None   Collection Time: 10/08/21  6:00 AM  Result Value Ref Range   Troponin I (High Sensitivity) 16 <18 ng/L    Comment: (NOTE) Elevated high sensitivity troponin I (hsTnI) values and significant  changes across serial measurements may suggest ACS but many other  chronic and acute conditions are known to elevate hsTnI results.  Refer to the "Links" section for chest pain algorithms and additional  guidance. Performed at [facility], [place] [address], [place], [place] [address]   Respiratory (~20 pathogens) panel by PCR     Status: None   Collection Time: 10/08/21  6:36 AM   Specimen: Respiratory  Result Value Ref Range   Adenovirus NOT DETECTED NOT DETECTED   Coronavirus 229E NOT DETECTED NOT DETECTED    Comment: (NOTE) The Coronavirus on the Respiratory Panel, DOES NOT test for the novel  Coronavirus (2019 nCoV)    Coronavirus HKU1 NOT DETECTED NOT DETECTED   Coronavirus NL63 NOT DETECTED NOT DETECTED   Coronavirus OC43 NOT DETECTED NOT DETECTED   Metapneumovirus NOT DETECTED NOT DETECTED   Rhinovirus / Enterovirus NOT DETECTED NOT DETECTED   Influenza A NOT DETECTED NOT DETECTED   Influenza  B NOT DETECTED NOT DETECTED   Parainfluenza Virus 1 NOT DETECTED NOT DETECTED   Parainfluenza Virus 2 NOT DETECTED NOT DETECTED   Parainfluenza Virus 3 NOT  DETECTED NOT DETECTED   Parainfluenza Virus 4 NOT DETECTED NOT DETECTED   Respiratory Syncytial Virus NOT DETECTED NOT DETECTED   Bordetella pertussis NOT DETECTED NOT DETECTED   Bordetella Parapertussis NOT DETECTED NOT DETECTED   Chlamydophila pneumoniae NOT DETECTED NOT DETECTED   Mycoplasma pneumoniae NOT DETECTED NOT DETECTED    Comment: Performed at [facility], [place] [address], [place], [place] [address]  CBG monitoring, ED     Status: Abnormal   Collection Time: 10/08/21  7:23 AM  Result Value Ref Range   Glucose-Capillary 159 (H) 70 - 99 mg/dL    Comment: Glucose reference range applies only to samples taken after fasting for at least 8 hours.   Comment 1 Notify RN    Comment 2 Document in Chart   CBG monitoring, ED     Status: Abnormal   Collection Time: 10/08/21  7:57 AM  Result Value Ref Range   Glucose-Capillary 162 (H) 70 - 99 mg/dL    Comment: Glucose reference range applies only to samples taken after fasting for at least 8 hours.   Comment 1 Notify RN    Comment 2 Document in Chart   CBG monitoring, ED     Status: Abnormal   Collection Time: 10/08/21  9:08 AM  Result Value Ref Range   Glucose-Capillary 142 (H) 70 - 99 mg/dL    Comment: Glucose reference range applies only to samples taken after fasting for at least 8 hours.   Comment 1 Notify RN    Comment 2 Document in Chart   CBG monitoring, ED     Status: Abnormal   Collection Time: 10/08/21 10:29 AM  Result Value Ref Range   Glucose-Capillary 220 (H) 70 - 99 mg/dL    Comment: Glucose reference range applies only to samples taken after fasting for at least 8 hours.   Comment 1 Notify RN    Comment 2 Document in Chart   CBG monitoring, ED     Status: Abnormal   Collection Time: 10/08/21 10:56 AM  Result Value Ref Range   Glucose-Capillary 200 (H) 70 - 99 mg/dL    Comment: Glucose reference range applies only to samples taken after fasting for at least 8 hours.   Comment 1 Notify RN    Comment 2  Document in Chart   Glucose, capillary     Status: Abnormal   Collection Time: 10/08/21 11:54 AM  Result Value Ref Range   Glucose-Capillary 185 (H) 70 - 99 mg/dL    Comment: Glucose reference range applies only to samples taken after fasting for at least 8 hours.    MICRO: reviewed IMAGING: ECHOCARDIOGRAM COMPLETE  Result Date: 10/08/2021    ECHOCARDIOGRAM REPORT   Patient Name:   BENTLIE Kurihara Date of Exam: 10/08/2021 Medical Rec #:  [id number]      Height:       62.0 in Accession #:    [id number]     Weight:       160.0 lb Date of Birth:  Jun 04, 1955       BSA:          1.739 m Patient Age:    30 years       BP:           114/85 mmHg Patient Gender: F  HR:           105 bpm. Exam Location:  Inpatient Procedure: 2D Echo, Color Doppler and Cardiac Doppler Indications:    R94.31 Abnormal EKG  History:        Patient has no prior history of Echocardiogram examinations.                 Arrythmias:Atrial Fibrillation; Risk Factors:Hypertension and                 Diabetes.  Sonographer:    Raquel Sarna Senior RDCS Referring Phys: [place]  1. Left ventricular ejection fraction, by estimation, is 55 to 60%. The left ventricle has normal function. The left ventricle has no regional wall motion abnormalities. Left ventricular diastolic parameters are indeterminate.  2. Right ventricular systolic function is normal. The right ventricular size is normal.  3. The mitral valve is abnormal. Trivial mitral valve regurgitation. No evidence of mitral stenosis.  4. The aortic valve is tricuspid. Aortic valve regurgitation is not visualized. No aortic stenosis is present.  5. The inferior vena cava is dilated in size with >50% respiratory variability, suggesting right atrial pressure of 8 mmHg. FINDINGS  Left Ventricle: Left ventricular ejection fraction, by estimation, is 55 to 60%. The left ventricle has normal function. The left ventricle has no regional wall motion abnormalities. The left  ventricular internal cavity size was normal in size. There is  no left ventricular hypertrophy. Left ventricular diastolic parameters are indeterminate. Right Ventricle: The right ventricular size is normal. No increase in right ventricular wall thickness. Right ventricular systolic function is normal. Left Atrium: Left atrial size was normal in size. Right Atrium: Right atrial size was normal in size. Pericardium: There is no evidence of pericardial effusion. Mitral Valve: The mitral valve is abnormal. There is mild thickening of the mitral valve leaflet(s). There is mild calcification of the mitral valve leaflet(s). Trivial mitral valve regurgitation. No evidence of mitral valve stenosis. Tricuspid Valve: The tricuspid valve is normal in structure. Tricuspid valve regurgitation is trivial. No evidence of tricuspid stenosis. Aortic Valve: The aortic valve is tricuspid. Aortic valve regurgitation is not visualized. No aortic stenosis is present. Pulmonic Valve: The pulmonic valve was normal in structure. Pulmonic valve regurgitation is not visualized. No evidence of pulmonic stenosis. Aorta: The aortic root is normal in size and structure. Venous: The inferior vena cava is dilated in size with greater than 50% respiratory variability, suggesting right atrial pressure of 8 mmHg. IAS/Shunts: No atrial level shunt detected by color flow Doppler.  LEFT VENTRICLE PLAX 2D LVIDd:         3.50 cm LVIDs:         2.70 cm LV PW:         1.50 cm LV IVS:        1.10 cm LVOT diam:     2.00 cm LV SV:         34 LV SV Index:   19 LVOT Area:     3.14 cm  RIGHT VENTRICLE RV S prime:     9.61 cm/s TAPSE (M-mode): 1.2 cm LEFT ATRIUM           Index LA diam:      3.60 cm 2.07 cm/m LA Vol (A2C): 40.7 ml 23.41 ml/m LA Vol (A4C): 46.8 ml 26.92 ml/m  AORTIC VALVE LVOT Vmax:   69.47 cm/s LVOT Vmean:  50.067 cm/s LVOT VTI:    0.107 m  AORTA Ao Root diam:  3.00 cm Ao Asc diam:  3.00 cm  SHUNTS Systemic VTI:  0.11 m Systemic Diam: 2.00 cm  Jenkins Rouge MD Electronically signed by Jenkins Rouge MD Signature Date/Time: 10/08/2021/11:27:49 AM    Final    CT ANGIO CHEST PE W OR WO CONTRAST  Result Date: 10/08/2021 CLINICAL DATA:  Irregular heartbeat.  Pulmonary embolism suspected. EXAM: CT ANGIOGRAPHY CHEST WITH CONTRAST TECHNIQUE: Multidetector CT imaging of the chest was performed using the standard protocol during bolus administration of intravenous contrast. Multiplanar CT image reconstructions and MIPs were obtained to evaluate the vascular anatomy. RADIATION DOSE REDUCTION: This exam was performed according to the departmental dose-optimization program which includes automated exposure control, adjustment of the mA and/or kV according to patient size and/or use of iterative reconstruction technique. CONTRAST:  59m OMNIPAQUE IOHEXOL 350 MG/ML SOLN COMPARISON:  Radiographs 10/08/2021.  CTA 09/06/2021. FINDINGS: Cardiovascular: The pulmonary arteries are well opacified with contrast to the level of the subsegmental branches. There is no evidence of acute pulmonary embolism. There is limited opacification of the systemic vessels which demonstrate mild atherosclerosis, but no acute abnormality. The heart size is normal. There is no pericardial effusion. Mediastinum/Nodes: There are no enlarged mediastinal, hilar or axillary lymph nodes.Diffuse esophageal dilatation again noted with associated air-fluid levels. The thyroid gland and trachea appear unremarkable. Lungs/Pleura: No pleural effusion or pneumothorax. Again demonstrated are patchy airspace and ground-glass opacities throughout both lungs. Compared with the previous CT, overall improvement is demonstrated. However, these opacities had partially cleared on radiographs 09/24/2021 and have progressed since then. There is some associated architectural distortion. No confluent airspace opacity or suspicious pulmonary nodule. Upper abdomen:  Calcified splenic granulomas.  No acute findings.  Musculoskeletal/Chest wall: There is no chest wall mass or suspicious osseous finding. Bilateral breast implants. Mild degenerative changes in the spine. Review of the MIP images confirms the above findings. IMPRESSION: 1. No evidence of acute pulmonary embolism or other acute vascular findings in the chest. 2. Recurrent/fluctuating bilateral ground-glass and airspace opacities, overall improved compared with prior CT of 1 month ago, but worsened from radiographs of 2 weeks ago. Differential considerations include atypical infection, non infectious inflammation, drug reaction and atypical edema. Given the fluctuation, lymphomatous involvement less likely. Radiographic follow up recommended. 3. No adenopathy or pleural effusion. 4. Dilated esophagus with air-fluid levels consistent with achalasia. Electronically Signed   By: WRichardean SaleM.D.   On: 10/08/2021 07:59   DG Chest Portable 1 View  Result Date: 10/08/2021 CLINICAL DATA:  66year old female with chest pain. History of lymphoma. EXAM: PORTABLE CHEST 1 VIEW COMPARISON:  Chest radiograph 09/24/2021 and earlier. FINDINGS: Widespread bilateral Patchy and confluent abnormal lung opacity by CTA in May appear significantly improved on radiographs earlier this month. However, on this Portable AP semi upright view at 0504 hours there is recurrent confluent bilateral perihilar opacity, including in the right upper lobe layering along the minor fissure. Mildly lower lung volumes now. Mediastinal contours are stable. Dilated thoracic esophagus again evident. Visualized tracheal air column is within normal limits. No pneumothorax or pulmonary edema. No air bronchograms now, streaky lung base opacity persists since last month. Stable visible upper abdomen with small epigastric clips. No acute osseous abnormality identified. IMPRESSION: 1. Recurrent bilateral perihilar lung opacity since June 2nd, and probable unresolved bilateral lower lung opacity seen by CTA last  month. This is indeterminate for recurrent infectious versus non infectious inflammatory, and less likely neoplastic (Lymphoma) etiology. Aspiration (#2) also a consideration. No pulmonary edema or significant pleural  effusion. 2. Dilated esophagus as seen on CTA last month. Electronically Signed   By: Genevie Ann M.D.   On: 10/08/2021 05:32     Assessment/Plan:  66yo F with ILD, recently admitted for ILD flare with covid pneumonia being tapered from high dose pred, currently on pred '30mg'$  plus bactrim DS TIW, admitted for chills/fever/hypoglycemia in the setting of recent increase in insulin. CXR not convincingly having new changes c/w recurrent pneumonia  - can continue on current abtx and if no growth on cx on day 3- would d/c abtx - in case of other + cultures,  - her clinical presentation  could be consistent with hypoglycemia, - causing mild leukamoid reaction with WBC of 17K - in terms of ILD, was on pred '30mg'$  daily, would continue for the time being and continue with oi proph with bactrim DS TIW  Dr Linus Salmons available for questions over the weekend. Elzie Rings [facility] [phone]

## 2021-10-08 NOTE — Evaluation (Signed)
Occupational Therapy Evaluation Patient Details Name: Meghan Alexander MRN: [id number] DOB: [birth year] Today's Date: 10/08/2021   History of Present Illness 66 y.o. female presenting with new onset a fib with rvr likely due to developing pneumonia.  Past medical history significant for ILD status post rituximab, follicular lymphoma grade 2, GERD, achalasia, hypertension, type 2 diabetes, right ankle fracture.   Clinical Impression   Patient admitted for the diagnosis above.  PTA she lives with her spouse, who assists as needed.  She has been managing at home with R ankle/foot fx with RW, supplemental O2 as needed, and other DME for ADL performance.  She is essentially at her baseline, no OT needs identified.  No post acute OT anticipated.         Recommendations for follow up therapy are one component of a multi-disciplinary discharge planning process, led by the attending physician.  Recommendations may be updated based on patient status, additional functional criteria and insurance authorization.   Follow Up Recommendations  No OT follow up    Assistance Recommended at Discharge PRN  Patient can return home with the following      Functional Status Assessment  Patient has not had a recent decline in their functional status  Equipment Recommendations  None recommended by OT    Recommendations for Other Services  PT Consult     Precautions / Restrictions Precautions Precautions: Other (comment) Precaution Comments: Watch HR and O2 Restrictions Weight Bearing Restrictions: Yes RLE Weight Bearing: Partial weight bearing RLE Partial Weight Bearing Percentage or Pounds: Patient maintainig close to TDWB.  Has a CAM walker at home, not in the hospital.      Mobility Bed Mobility Overal bed mobility: Modified Independent                  Transfers Overall transfer level: Modified independent Equipment used: Rolling walker (2 wheels)               General transfer  comment: In room mobility/toileting.  SOB noted with HR to 105.      Balance Overall balance assessment: Needs assistance Sitting-balance support: Feet supported Sitting balance-[name]: Normal     Standing balance support: Bilateral upper extremity supported Standing balance-[name]: Fair                             ADL either performed or assessed with clinical judgement   ADL Overall ADL's : At baseline                                             Vision Baseline Vision/History: 1 Wears glasses Patient Visual Report: No change from baseline       Perception Perception Perception: Not tested   Praxis Praxis Praxis: Not tested    Pertinent Vitals/Pain Pain Assessment Pain Assessment: No/denies pain     Hand Dominance Right   Extremity/Trunk Assessment Upper Extremity Assessment Upper Extremity Assessment: Overall WFL for tasks assessed   Lower Extremity Assessment Lower Extremity Assessment: Defer to PT evaluation   Cervical / Trunk Assessment Cervical / Trunk Assessment: Normal   Communication Communication Communication: No difficulties   Cognition Arousal/Alertness: Awake/alert Behavior During Therapy: WFL for tasks assessed/performed Overall Cognitive Status: Within Functional Limits for tasks assessed  General Comments   HR to 105 and O2 sat 87% on RA with mobility to/from bathroom    Exercises     Shoulder Instructions      Home Living Family/patient expects to be discharged to:: Private residence Living Arrangements: Spouse/significant other Available Help at Discharge: Family;Available 24 hours/day Type of Home: House Home Access: Stairs to enter [employer] of Steps: 2   Home Layout: Two level;Able to live on main level with bedroom/bathroom Alternate Level Stairs-Number of Steps: full flight.  Recently started sleeping upstairs.    Bathroom Shower/Tub: Advertising copywriter: Yes   Home Equipment: Rollator (4 wheels);Crutches;Rolling Walker (2 wheels);Shower seat          Prior Functioning/Environment               Mobility Comments: independent prior to fx. Recently using rollator as "wheelchair" to get around house. Has crutches that she uses to navigate stairs ADLs Comments: Has been managing ADLs with Setup assist at most since ankle fx.  Assists as able to with home management and meal prep.        OT Problem List: Decreased activity tolerance      OT Treatment/Interventions:      OT Goals(Current goals can be found in the care plan section) Acute Rehab OT Goals Patient Stated Goal: Hoping to return home in a few days. OT Goal Formulation: With patient Time For Goal Achievement: 10/15/21 Potential to Achieve Goals: Good  OT Frequency:      Co-evaluation              AM-PAC OT "6 Clicks" Daily Activity     Outcome Measure Help from another person eating meals?: None Help from another person taking care of personal grooming?: None Help from another person toileting, which includes using toliet, bedpan, or urinal?: None Help from another person bathing (including washing, rinsing, drying)?: None Help from another person to put on and taking off regular upper body clothing?: None Help from another person to put on and taking off regular lower body clothing?: None 6 Click Score: 24   End of Session Equipment Utilized During Treatment: Rolling walker (2 wheels) Nurse Communication: Mobility status  Activity Tolerance: Patient tolerated treatment well Patient left: in bed;with call bell/phone within reach;with family/visitor present  OT Visit Diagnosis: Other symptoms and signs involving cognitive function                Time: 1532-1550 OT Time Calculation (min): 18 min Charges:  OT General Charges $OT Visit: 1 Visit OT Evaluation $OT  Eval Moderate Complexity: 1 Mod  10/08/2021  RP, OTR/L  Acute Rehabilitation Services  Office:  [phone]   Metta Clines 10/08/2021, 4:01 PM

## 2021-10-08 NOTE — ED Notes (Signed)
Transported to echo

## 2021-10-08 NOTE — ED Notes (Signed)
ED TO INPATIENT HANDOFF REPORT  ED Nurse Name and Phone #:   S Name/Age/Gender Meghan Alexander 66 y.o. female Room/Bed: 025C/025C  Code Status   Code Status: Full Code  Home/SNF/Other Home Patient oriented to: self, place, time, and situation Is this baseline? Yes   Triage Complete: Triage complete  Chief Complaint Atrial fibrillation with RVR ([place]) [I48.91]  Triage Note No notes on file   Allergies Allergies  Allergen Reactions   Latex Other (See Comments)    Blisters / skin peeling   Codeine Nausea And Vomiting and Other (See Comments)    hallucinations   Atenolol Cough   Other Other (See Comments)    DermaPlast; Used after surgery - caused swelling, itching, and wound broke open   Hydrocodone Nausea And Vomiting   Oxycodone Nausea And Vomiting    Level of Care/Admitting Diagnosis ED Disposition     ED Disposition  Admit   Condition  --   [place]: [place] [[id number]]  Level of Care: Progressive [[id number]]  Admit to Progressive based on following criteria: CARDIOVASCULAR & THORACIC of moderate stability with acute coronary syndrome symptoms/low risk myocardial infarction/hypertensive urgency/arrhythmias/heart failure potentially compromising stability and stable post cardiovascular intervention patients.  May admit patient to [facility] or [facility] if equivalent level of care is available:: No  Covid Evaluation: Recent COVID positive no isolation required infection day 21-90  Diagnosis: Atrial fibrillation with RVR [facility]) [[id number]]  Admitting Physician: Gladys Damme [[id number]]  Attending Physician: Lind Covert [[id number]]  Estimated length of stay: past midnight tomorrow  Certification:: I certify this patient will need inpatient services for at least 2 midnights          B Medical/Surgery History Past Medical History:  Diagnosis Date   Allergy    year around allergies   Arthritis    fingers, knees, neck,  back-osteoarthristis   Bronchitis    hx of   Complication of anesthesia    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 ([place]) 04/02/2019   GERD (gastroesophageal reflux disease)    hx of reflux-went away with elevating HOB    Hypertension    Pneumonia    hx of    PONV (postoperative nausea and vomiting)    Type 2 diabetes mellitus ([place]) 08/02/2021   Past Surgical History:  Procedure Laterality Date   ABDOMINAL HYSTERECTOMY     ADENOIDECTOMY     BRONCHIAL BRUSHINGS  08/03/2021   Procedure: BRONCHIAL BRUSHINGS;  Surgeon: Collene Gobble, MD;  Location: [place];  Service: Cardiopulmonary;;   BRONCHIAL WASHINGS  08/03/2021   Procedure: BRONCHIAL WASHINGS;  Surgeon: Collene Gobble, MD;  Location: [place];  Service: Cardiopulmonary;;   cosmetic surgeries     ESOPHAGEAL MANOMETRY N/A 12/02/2015   Procedure: ESOPHAGEAL MANOMETRY (EM);  Surgeon: [name], MD;  Location: [facility];  Service: Endoscopy;  Laterality: N/A;   ESOPHAGOGASTRODUODENOSCOPY N/A 12/02/2015   Procedure: ESOPHAGOGASTRODUODENOSCOPY (EGD);  Surgeon: [name], MD;  Location: [facility];  Service: Endoscopy;  Laterality: N/A;   lymph node removal left leg     cat scratch surgery    ORIF ANKLE FRACTURE Right 09/03/2021   Procedure: OPEN REDUCTION INTERNAL FIXATION (ORIF) ANKLE FRACTURE;  Surgeon: Willaim Sheng, MD;  Location: [place];  Service: Orthopedics;  Laterality: Right;   THROAT SURGERY     sleep apnea surgery    TONSILLECTOMY     VIDEO BRONCHOSCOPY  08/03/2021   Procedure: VIDEO BRONCHOSCOPY  WITH FLUORO;  Surgeon: Collene Gobble, MD;  Location[facility] END[facility];  Service: Cardiopulmonary;;     A IV Location/Drains/Wounds Patient Lines/Drains/Airways Status     Active Line/Drains/Airways     Name Placement date Placement time Site Days   Peripheral IV 10/08/21 20 G 1.75" Left Antecubital 10/08/21  0502  Antecubital  less than 1   Incision (Closed) 12/18/18 Back Mid;Lower  12/18/18  0902  -- 1025   Incision (Closed) 09/03/21 Ankle Right 09/03/21  1318  -- 35            Intake/Output Last 24 hours No intake or output data in the 24 hours ending 10/08/21 1038  Labs/Imaging Results for orders placed or performed during the hospital encounter of 10/08/21 (from the past 48 hour(s))  Troponin I (High Sensitivity)     Status: None   Collection Time: 10/08/21  5:07 AM  Result Value Ref Range   Troponin I (High Sensitivity) 9 <18 ng/L    Comment: (NOTE) Elevated high sensitivity troponin I (hsTnI) values and significant  changes across serial measurements may suggest ACS but many other  chronic and acute conditions are known to elevate hsTnI results.  Refer to the "Links" section for chest pain algorithms and additional  guidance. Performed at [facility], [place] [address] [place], [place] [address]   CBC     Status: Abnormal   Collection Time: 10/08/21  5:07 AM  Result Value Ref Range   WBC 16.6 (H) 4.0 - 10.5 K/uL   RBC 4.88 3.87 - 5.11 MIL/uL   Hemoglobin 12.0 12.0 - 15.0 g/dL   HCT 39.7 36.0 - 46.0 %   MCV 81.4 80.0 - 100.0 fL   MCH 24.6 (L) 26.0 - 34.0 pg   MCHC 30.2 30.0 - 36.0 g/dL   RDW 16.9 (H) 11.5 - 15.5 %   Platelets 268 150 - 400 K/uL   nRBC 0.0 0.0 - 0.2 %    Comment: Performed at [facility], [place] [address], [place], [place] [address]  Comprehensive metabolic panel     Status: Abnormal   Collection Time: 10/08/21  5:07 AM  Result Value Ref Range   Sodium 141 135 - 145 mmol/L   Potassium 4.2 3.5 - 5.1 mmol/L   Chloride 100 98 - 111 mmol/L   CO2 26 22 - 32 mmol/L   Glucose, Bld 113 (H) 70 - 99 mg/dL    Comment: Glucose reference range applies only to samples taken after fasting for at least 8 hours.   BUN 18 8 - 23 mg/dL   Creatinine, Ser 0.91 0.44 - 1.00 mg/dL   Calcium 9.9 8.9 - 10.3 mg/dL   Total Protein 6.9 6.5 - 8.1 g/dL   Albumin 3.4 (L) 3.5 - 5.0 g/dL   AST 27 15 - 41 U/L   ALT 29 0 - 44 U/L   Alkaline  Phosphatase 81 38 - 126 U/L   Total Bilirubin 0.5 0.3 - 1.2 mg/dL   GFR, Estimated >60 >60 mL/min    Comment: (NOTE) Calculated using the CKD-EPI Creatinine Equation (2021)    Anion gap 15 5 - 15    Comment: Performed at [facility] [address], [place], [place] [address]  CBG monitoring, ED     Status: None   Collection Time: 10/08/21  5:23 AM  Result Value Ref Range   Glucose-Capillary 85 70 - 99 mg/dL    Comment: Glucose reference range applies only to  samples taken after fasting for at least 8 hours.  Troponin I (High Sensitivity)     Status: None   Collection Time: 10/08/21  6:00 AM  Result Value Ref Range   Troponin I (High Sensitivity) 16 <18 ng/L    Comment: (NOTE) Elevated high sensitivity troponin I (hsTnI) values and significant  changes across serial measurements may suggest ACS but many other  chronic and acute conditions are known to elevate hsTnI results.  Refer to the "Links" section for chest pain algorithms and additional  guidance. Performed at [facility], [place] [address] [place], [place] [address]   Respiratory (~20 pathogens) panel by PCR     Status: None   Collection Time: 10/08/21  6:36 AM   Specimen: Respiratory  Result Value Ref Range   Adenovirus NOT DETECTED NOT DETECTED   Coronavirus 229E NOT DETECTED NOT DETECTED    Comment: (NOTE) The Coronavirus on the Respiratory Panel, DOES NOT test for the novel  Coronavirus (2019 nCoV)    Coronavirus HKU1 NOT DETECTED NOT DETECTED   Coronavirus NL63 NOT DETECTED NOT DETECTED   Coronavirus OC43 NOT DETECTED NOT DETECTED   Metapneumovirus NOT DETECTED NOT DETECTED   Rhinovirus / Enterovirus NOT DETECTED NOT DETECTED   Influenza A NOT DETECTED NOT DETECTED   Influenza B NOT DETECTED NOT DETECTED   Parainfluenza Virus 1 NOT DETECTED NOT DETECTED   Parainfluenza Virus 2 NOT DETECTED NOT DETECTED   Parainfluenza Virus 3 NOT DETECTED NOT DETECTED   Parainfluenza Virus 4 NOT DETECTED NOT  DETECTED   Respiratory Syncytial Virus NOT DETECTED NOT DETECTED   Bordetella pertussis NOT DETECTED NOT DETECTED   Bordetella Parapertussis NOT DETECTED NOT DETECTED   Chlamydophila pneumoniae NOT DETECTED NOT DETECTED   Mycoplasma pneumoniae NOT DETECTED NOT DETECTED    Comment: Performed at [facility], [place] [address], [place], [place] [address]  CBG monitoring, ED     Status: Abnormal   Collection Time: 10/08/21  7:23 AM  Result Value Ref Range   Glucose-Capillary 159 (H) 70 - 99 mg/dL    Comment: Glucose reference range applies only to samples taken after fasting for at least 8 hours.   Comment 1 Notify RN    Comment 2 Document in Chart   CBG monitoring, ED     Status: Abnormal   Collection Time: 10/08/21  7:57 AM  Result Value Ref Range   Glucose-Capillary 162 (H) 70 - 99 mg/dL    Comment: Glucose reference range applies only to samples taken after fasting for at least 8 hours.   Comment 1 Notify RN    Comment 2 Document in Chart   CBG monitoring, ED     Status: Abnormal   Collection Time: 10/08/21  9:08 AM  Result Value Ref Range   Glucose-Capillary 142 (H) 70 - 99 mg/dL    Comment: Glucose reference range applies only to samples taken after fasting for at least 8 hours.   Comment 1 Notify RN    Comment 2 Document in Chart   CBG monitoring, ED     Status: Abnormal   Collection Time: 10/08/21 10:29 AM  Result Value Ref Range   Glucose-Capillary 220 (H) 70 - 99 mg/dL    Comment: Glucose reference range applies only to samples taken after fasting for at least 8 hours.   Comment 1 Notify RN    Comment 2 Document in Chart    CT ANGIO CHEST PE W OR WO CONTRAST  Result Date:  10/08/2021 CLINICAL DATA:  Irregular heartbeat.  Pulmonary embolism suspected. EXAM: CT ANGIOGRAPHY CHEST WITH CONTRAST TECHNIQUE: Multidetector CT imaging of the chest was performed using the standard protocol during bolus administration of intravenous contrast. Multiplanar CT image reconstructions  and MIPs were obtained to evaluate the vascular anatomy. RADIATION DOSE REDUCTION: This exam was performed according to the departmental dose-optimization program which includes automated exposure control, adjustment of the mA and/or kV according to patient size and/or use of iterative reconstruction technique. CONTRAST:  42m OMNIPAQUE IOHEXOL 350 MG/ML SOLN COMPARISON:  Radiographs 10/08/2021.  CTA 09/06/2021. FINDINGS: Cardiovascular: The pulmonary arteries are well opacified with contrast to the level of the subsegmental branches. There is no evidence of acute pulmonary embolism. There is limited opacification of the systemic vessels which demonstrate mild atherosclerosis, but no acute abnormality. The heart size is normal. There is no pericardial effusion. Mediastinum/Nodes: There are no enlarged mediastinal, hilar or axillary lymph nodes.Diffuse esophageal dilatation again noted with associated air-fluid levels. The thyroid gland and trachea appear unremarkable. Lungs/Pleura: No pleural effusion or pneumothorax. Again demonstrated are patchy airspace and ground-glass opacities throughout both lungs. Compared with the previous CT, overall improvement is demonstrated. However, these opacities had partially cleared on radiographs 09/24/2021 and have progressed since then. There is some associated architectural distortion. No confluent airspace opacity or suspicious pulmonary nodule. Upper abdomen:  Calcified splenic granulomas.  No acute findings. Musculoskeletal/Chest wall: There is no chest wall mass or suspicious osseous finding. Bilateral breast implants. Mild degenerative changes in the spine. Review of the MIP images confirms the above findings. IMPRESSION: 1. No evidence of acute pulmonary embolism or other acute vascular findings in the chest. 2. Recurrent/fluctuating bilateral ground-glass and airspace opacities, overall improved compared with prior CT of 1 month ago, but worsened from radiographs of 2  weeks ago. Differential considerations include atypical infection, non infectious inflammation, drug reaction and atypical edema. Given the fluctuation, lymphomatous involvement less likely. Radiographic follow up recommended. 3. No adenopathy or pleural effusion. 4. Dilated esophagus with air-fluid levels consistent with achalasia. Electronically Signed   By: WRichardean SaleM.D.   On: 10/08/2021 07:59   DG Chest Portable 1 View  Result Date: 10/08/2021 CLINICAL DATA:  66year old female with chest pain. History of lymphoma. EXAM: PORTABLE CHEST 1 VIEW COMPARISON:  Chest radiograph 09/24/2021 and earlier. FINDINGS: Widespread bilateral Patchy and confluent abnormal lung opacity by CTA in May appear significantly improved on radiographs earlier this month. However, on this Portable AP semi upright view at 0504 hours there is recurrent confluent bilateral perihilar opacity, including in the right upper lobe layering along the minor fissure. Mildly lower lung volumes now. Mediastinal contours are stable. Dilated thoracic esophagus again evident. Visualized tracheal air column is within normal limits. No pneumothorax or pulmonary edema. No air bronchograms now, streaky lung base opacity persists since last month. Stable visible upper abdomen with small epigastric clips. No acute osseous abnormality identified. IMPRESSION: 1. Recurrent bilateral perihilar lung opacity since June 2nd, and probable unresolved bilateral lower lung opacity seen by CTA last month. This is indeterminate for recurrent infectious versus non infectious inflammatory, and less likely neoplastic (Lymphoma) etiology. Aspiration (#2) also a consideration. No pulmonary edema or significant pleural effusion. 2. Dilated esophagus as seen on CTA last month. Electronically Signed   By: HGenevie AnnM.D.   On: 10/08/2021 05:32    Pending Labs Unresulted Labs (From admission, onward)     Start     Ordered   10/09/21 00086 Basic metabolic  panel   Tomorrow morning,   R        10/08/21 0650   10/09/21 0500  CBC  Tomorrow morning,   R        10/08/21 0650   10/08/21 0636  MRSA Next Gen by PCR, Nasal  (COPD / Pneumonia / Cellulitis / Lower Extremity Wound)  Once,   R        10/08/21 0650   10/08/21 0636  Strep pneumoniae urinary antigen  (COPD / Pneumonia / Cellulitis / Lower Extremity Wound)  Once,   R        10/08/21 0650   10/08/21 0636  Legionella Pneumophila Serogp 1 Ur Ag  (COPD / Pneumonia / Cellulitis / Lower Extremity Wound)  Once,   R        10/08/21 0650   10/08/21 0635  Culture, blood (routine x 2) Call MD if unable to obtain prior to antibiotics being given  (COPD / Pneumonia / Cellulitis / Lower Extremity Wound)  BLOOD CULTURE X 2,   R     Comments: If blood cultures drawn in Emergency Department - Do not draw and cancel order    10/08/21 0650   10/08/21 0635  Expectorated Sputum Assessment w Gram Stain, Rflx to Resp Cult  (COPD / Pneumonia / Cellulitis / Lower Extremity Wound)  Once,   R        10/08/21 0650            Vitals/Pain Today's Vitals   10/08/21 0845 10/08/21 0900 10/08/21 0909 10/08/21 0915  BP: 91/67 99/66  111/70  Pulse: (!) 34 (!) 47  77  Resp: (!) 22 (!) 27  (!) 29  Temp:      TempSrc:      SpO2: 90% 93%  94%  Weight:      Height:      PainSc:   6      Isolation Precautions Droplet precaution  Medications Medications  amLODipine (NORVASC) tablet 10 mg (10 mg Oral Given 10/08/21 1034)  losartan (COZAAR) tablet 50 mg (50 mg Oral Given 10/08/21 1034)  rosuvastatin (CRESTOR) tablet 10 mg (10 mg Oral Given 10/08/21 1034)  venlafaxine XR (EFFEXOR-XR) 24 hr capsule 37.5 mg (37.5 mg Oral Given 10/08/21 0759)  pantoprazole (PROTONIX) EC tablet 40 mg (40 mg Oral Given 10/08/21 1034)  acyclovir (ZOVIRAX) tablet 400 mg (400 mg Oral Given 10/08/21 1034)  predniSONE (DELTASONE) tablet 40 mg (40 mg Oral Given 10/08/21 0759)  albuterol (PROVENTIL) (2.5 MG/3ML) 0.083% nebulizer solution 2.5 mg (has no  administration in time range)  apixaban (ELIQUIS) tablet 5 mg (5 mg Oral Given 10/08/21 1034)  insulin aspart (novoLOG) injection 0-15 Units (5 Units Subcutaneous Given 10/08/21 1034)  insulin glargine-yfgn (SEMGLEE) injection 10 Units (has no administration in time range)  acetaminophen (TYLENOL) tablet 650 mg (650 mg Oral Given 10/08/21 0759)    Or  acetaminophen (TYLENOL) suppository 650 mg ( Rectal See Alternative 10/08/21 0759)  diltiazem (CARDIZEM) 125 mg in dextrose 5% 125 mL (1 mg/mL) infusion (10 mg/hr Intravenous New Bag/Given 10/08/21 1028)  cefTRIAXone (ROCEPHIN) 1 g in sodium chloride 0.9 % 100 mL IVPB (has no administration in time range)  azithromycin (ZITHROMAX) tablet 500 mg (has no administration in time range)  aspirin chewable tablet 324 mg (324 mg Oral Given 10/08/21 0519)  cefTRIAXone (ROCEPHIN) 1 g in sodium chloride 0.9 % 100 mL IVPB (0 g Intravenous Stopped 10/08/21 0715)  azithromycin (ZITHROMAX) 500 mg in sodium chloride 0.9 %  250 mL IVPB (0 mg Intravenous Stopped 10/08/21 0908)  iohexol (OMNIPAQUE) 350 MG/ML injection 50 mL (50 mLs Intravenous Contrast Given 10/08/21 0746)    Mobility walks Low fall risk   Focused Assessments Cardiac Assessment Handoff:  Cardiac Rhythm: Atrial fibrillation Lab Results  Component Value Date   TROPONINI <0.30 08/15/2013   Lab Results  Component Value Date   DDIMER 0.66 (H) 08/02/2021   Does the Patient currently have chest pain? No    R Recommendations: See Admitting Provider Note  Report given to:   Additional Notes: Ambulatory to bathroom, on room air, AOx4

## 2021-10-08 NOTE — H&P (Signed)
Hospital Admission History and Physical Service Pager: [phone]  Patient name: Meghan Alexander Medical record number: [id number] Date of Birth: 26-Jan-1956 Age: 66 y.o. Gender: female  Primary Care Provider: [name], MD Consultants: Pulmonology, cardiology Code Status: Full Preferred Emergency Contact: Husband, Alfonse Spruce  Chief Complaint: fever, hypoglycemia, tachycardia  Assessment and Plan: Meghan Alexander is a 66 y.o. female presenting with new onset a fib with rvr likely due to developing pneumonia.  Past medical history significant for ILD status post rituximab, follicular lymphoma grade 2, GERD, achalasia, hypertension, type 2 diabetes, right ankle fracture.   * Atrial fibrillation with RVR ([place]) New onset atrial fibrillation found on presentation to the ED. Patient is currently on cardizem gtt and while otherwise stable, is continuing to have tachycardia to 150s. CHADS2-VAS2C score of 4, indicating need for anticoagulation, started apixaban. Suspect new onset afib is most likely due to underlying infectious cause of pneumonia (see below). As patient has complex history and is not easily controlled on cardizem gtt, will place cardiology consult. - Admit to [facility], Dr. Erin Hearing, progressive - Cardizem gtt, titration goal of HR 90-105, MAP >65 - Consult cardiology, appreciate recommendations - Continuous cardiac monitoring - Start apixaban 5 mg BID  CAP (community acquired pneumonia) 66 yo woman presenting with symptom constellation of fever with chills and sweats at home tonight with leukocytosis to 16.6, and new findings of bilateral opacities in RML and LUL on CXR. Patient has complicated pulmonary history: Patient was admitted from April 10 to April 17 for acute respiratory failure with diffuse groundglass opacities with progressive cough, night sweats, chills and dyspnea.  She completed 3 courses of outpatient antibiotics without improvement.  She then underwent a  bronchoscopy with BAL of the right middle lobe and transbronchial brushings of the right lower lobe on April 11.  BAL did not indicate malignant cells, culture grew strep mitis.  Fungitell and quantiferon gold were negative during that admission.  She was treated with ceftriaxone and azithromycin and then de-escalated to Augmentin for 14-day course with high-dose steroids, discharged on 5 days of prednisone 40 mg daily. In May she was managed for acute hypoxic respiratory failure again, she was found to have COVID pneumonia on 5/15. She went home on 2L [place] O2, has not needed oxygen for about 2 weeks. She does have clear sputum production since COVID infection, no change in sputum quality or volume. Patient saw pulmonology outpatient on 09/24/21 and prednisone decreased to 30 mg daily, bactrim MWF. Last admission, hypoxia also thought to be due to combination of ILD pneumonitis s/p rituximab vs infectious etiology vs aspiration pna given h/o achalasia.  Most likely etiology for current symptoms is pneumonia given fever, leukocytosis and new opacities, however given significant history with unusual infectious agents, will consult ID. Will obtain CTA chest to assess for PE, which is possible, and to better visualize new opacities. Will consult pulmonology given patient's complex history. She is currently stable on RA with SpO2 in mid 90s, though tachypnea to mid 20s.  - CTA chest - Continuous pulse oximetry, maintain SpO2 >92% - Ceftriaxone IV, Azithromycin IV - Increase prednisone to 40 mg daily - Albuterol prn for wheezing - Obtain blood culture - Influenza panel, legionella pneumophilia, strep pneumo urinary antigen, sputum culture - AM CBC - Consult ID, appreciate recommendations - Consult pulmonology, appreciate recommendations    Uncontrolled type 2 diabetes mellitus with hyperglycemia ([place]) Patient with DM2 on home lantus 20U daily and recently increased meal time coverage of  15U TID AC. Patient  was hypoglycemic to 40s per EMS report. In ED, CBGs appropriate to 159 after eating food. Patient has had hyperglycemia in recent weeks due to prednisone steroid usage.  - CBG TID AC QHS - Semglee 10U - SSI moderate - Consider adding prandial coverage if uncontrolled - Carb modified diet  Achalasia Patient has a history of achalasia and prior to last admission was unable to tolerate swallowing pills.  However when she started the prednisone she reports that she was easily able to eat and take her pills orally.  She has had no problem with dysphagia or achalasia since last discharge.  She was supposed to have an outpatient EGD with GI yesterday, however this was canceled given the improvement in her status.  Patient followed up with pulmonology on June 2, who postulated possibly esophageal esophagitis as patient improved with glucocorticoids. - Continue to monitor - Protonix 40 mg qd - Prednisone 40 mg qd  Closed right ankle fracture S/p ORIF for distal fibular and medial malleolar fracture, operated on 09/03/21 with Dr. Ephraim Hamburger. Patient is out of boot, able to bear weight, reports limited walking at home. Patient was on 8 weeks of ASA 81 mg daily- held in setting of anticoagulation as above. - WBAT - Tylenol 650 mg q6h prn  - PT/OT eval and treat  Other chronic conditions: Hypertension: Continue home medications losartan 50 mg, amlodipine 10 mg p.o. daily Hyperlipidemia: Continue crestor 10 mg qd PO HSV: acyclovir mg qd PO Depression: Venlafaxine 37.61m qd PO  FEN/GI: protonix, carb restricted diet VTE Prophylaxis: apixaban  Disposition: to progressive pending further medical work up and stabilization  History of Present Illness:  Meghan Curringtonis a 66y.o. female presenting with fever, chills, hypoglycemia that started tonight.  Patient reports that she was in her usual state of health until yesterday when her physician increased her insulin due to prednisone usage.  She reports she  did not feel well last night, "felt like I would die."  Then tonight patient was not making sense, appeared pale and sweaty with a fever.  Her husband got worried and checked her blood sugar, which was 42.  He called EMS.  Blood sugar improved after patient ate something.  She denies nausea, vomiting, diarrhea, no cough, does endorse sputum and dyspnea.  She has been coughing up sputum since her last admission, no change in volume or purulence/color.  In the ED, had notable tachycardia to the 150s, A-fib on EKG.  Chest x-ray with new opacities in right middle lobe and left upper lobe.  Leukocytosis to 16.6.  All other labs within normal limits.  CBG improved to 150s.  Review Of Systems: Per HPI with the following additions: See above  Pertinent Past Medical History: See above in assessment and plan Remainder reviewed in history tab.   Pertinent Past Surgical History: ORIF right ankle Remainder reviewed in history tab.   Pertinent Social History: Tobacco use: No Alcohol use: None Other Substance use: None Lives with husband  Pertinent Family History: Not relevant  Remainder reviewed in history tab.   Important Outpatient Medications: See above Remainder reviewed in medication history.   Objective: BP 107/78   Pulse 71   Temp 97.8 F (36.6 C) (Oral)   Resp (!) 24   Ht _0  (1.575 m)   Wt 72.6 kg   SpO2 95%   BMI 29.26 kg/m  Exam: General: Age-appropriate, WW, resting comfortably in bed, NAD, WN WD Eyes: Anicteric, PERRLA ENTM: Clear  oropharynx, MMM Neck: Supple Cardiovascular: Irregularly irregular rhythm tachycardic rate, no murmurs rubs or gallops, 2+ radial pulse Respiratory: Crackles present in bilateral middle and lower lobes, no accessory muscle usage, tachypneic to 20s, no wheezing Gastrointestinal: Soft, NT, ND MSK: Bilateral lower extremities are warm, dry, slight swelling in right ankle, compression bandage in place.  2+ DPs Derm: Warm, dry, intact Neuro: AO  x4 Psych: Pleasant and appropriate  Labs:  CBC BMET  Recent Labs  Lab 10/08/21 0507  WBC 16.6*  HGB 12.0  HCT 39.7  PLT 268   Recent Labs  Lab 10/08/21 0507  NA 141  K 4.2  CL 100  CO2 26  BUN 18  CREATININE 0.91  GLUCOSE 113*  CALCIUM 9.9    Pertinent additional labs- none. EKG: My own interpretation (not copied from electronic read) atrial fibrillation with RVR  Imaging Studies Performed:  Imaging Study (ie. Chest x-ray) Impression from Radiologist: Recurrent bilateral perihilar lung opacity since June 2 and probable unresolved bilateral lower lung opacity seen by CTA last month.  Indeterminate for recurrent infectious versus noninfectious inflammatory and less likely neoplastic (lymphoma) etiology.  Aspiration also consideration.  No pulmonary edema or significant pleural effusion.  Dilated esophagus as seen on CT last month.  My Interpretation: New opacities in right middle lobe and left upper lobe as compared to AP on June 2  Gladys Damme, MD 10/08/2021, 7:58 AM PGY-3, [facility] pager: [phone], text pages welcome Secure chat group [facility]

## 2021-10-08 NOTE — ED Provider Notes (Signed)
[facility] EMERGENCY DEPARTMENT Provider Note   CSN: [id number] Arrival date & time: 10/08/21  0407     History  Chief Complaint  Patient presents with   Irregular Heart Beat  Level 5 caveat due to acuity of condition  Meghan Alexander is a 66 y.o. female.  The history is provided by the patient.  Palpitations Palpitations quality:  Fast Onset quality:  Sudden Timing:  Constant Progression:  Unchanged Chronicity:  New Relieved by:  Nothing Worsened by:  Nothing Associated symptoms: shortness of breath   Associated symptoms: no chest pain   Patient history of diabetes, hypertension, interstitial lung disease presents with palpitations and confusion.  Apparently husband noted the patient appeared confused and blood glucose was low.  Initial glucose was 42.  Patient was noted to be pale and diaphoretic she began to improve after eating food.  Patient also noted to be tachycardic was noted to be in atrial fibrillation with RVR, patient reported palpitations.  She has never had A-fib before and is unclear when this started  Patient reports ORIF last month of right ankle.  She is supposed to be on aspirin but has been cutting back on this.    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 ([place]) 04/02/2019   GERD (gastroesophageal reflux disease)    hx of reflux-went away with elevating HOB    Hypertension    Pneumonia    hx of    PONV (postoperative nausea and vomiting)    Type 2 diabetes mellitus ([place]) 08/02/2021    Home Medications Prior to Admission medications   Medication Sig Start Date End Date Taking? Authorizing Provider  Accu-Chek Softclix Lancets lancets Use to test blood sugars up to 4 times daily as directed. 08/09/21   Allie Bossier, MD   acetaminophen (TYLENOL) 325 MG tablet Take 2 tablets (650 mg total) by mouth every 8 (eight) hours as needed. 09/08/21 10/08/21  Ezequiel Essex, MD  acyclovir (ZOVIRAX) 400 MG tablet TAKE 1 TABLET(400 MG) BY MOUTH DAILY Patient taking differently: Take 400 mg by mouth daily. 03/24/21   Brunetta Genera, MD  albuterol (VENTOLIN HFA) 108 (90 Base) MCG/ACT inhaler Inhale 2 puffs into the lungs daily as needed for shortness of breath. 06/11/21   [provider]  amLODipine (NORVASC) 10 MG tablet Take 10 mg by mouth daily. 09/02/21   [provider]  Blood Glucose Monitoring Suppl (BLOOD GLUCOSE MONITOR SYSTEM) w/Device KIT Use to test blood sugars up to 4 times daily. 08/09/21   Allie Bossier, MD  diphenhydrAMINE (BENADRYL) 25 MG tablet Take 25 mg by mouth at bedtime.    [provider]  feeding supplement (ENSURE ENLIVE / ENSURE PLUS) LIQD Take 237 mLs by mouth 3 (three) times daily between meals. 09/08/21   Ezequiel Essex, MD  ferrous sulfate 325 (65 FE) MG tablet Take 1 tablet (325 mg total) by mouth every other day. 09/08/21 11/07/21  Gerrit Heck, MD  glucose blood (ACCU-CHEK GUIDE) test strip Use to test blood sugars up to 4 times daily as directed. 08/09/21   Allie Bossier, MD  HUMALOG KWIKPEN 100 UNIT/ML KwikPen Inject 4 Units into the skin with breakfast, with lunch, and with evening meal. Patient taking differently:  Inject 4 Units into the skin with breakfast, with lunch, and with evening meal. 15 units with breakfast and lunch. 10 units before bed. 09/08/21   Ezequiel Essex, MD  insulin detemir (LEVEMIR) 100 UNIT/ML FlexPen Inject 10 Units into the skin daily. Patient taking differently: Inject 20 Units into the skin daily. 09/08/21   Ezequiel Essex, MD  Insulin Pen Needle 32G X 4 MM MISC Use to inject insulin up to 4 times daily as directed. 08/09/21   Allie Bossier, MD  ipratropium (ATROVENT) 0.03 % nasal spray Place 1 spray into both nostrils daily. 06/11/21    [provider]  Ipratropium-Albuterol (COMBIVENT) 20-100 MCG/ACT AERS respimat Inhale 1 puff into the lungs every 6 (six) hours as needed for wheezing. 08/09/21   Allie Bossier, MD  losartan (COZAAR) 50 MG tablet Take 50 mg by mouth daily.    [provider]  Melatonin 10 MG CAPS Take 10 mg by mouth at bedtime.     [provider]  metFORMIN (GLUCOPHAGE) 500 MG tablet Take 500 mg by mouth 2 (two) times daily with a meal. 07/23/19   [provider]  metoprolol succinate (TOPROL-XL) 50 MG 24 hr tablet Take 50 mg by mouth daily. 12/21/15   [provider]  Multiple Vitamin (MULTIVITAMIN) tablet Take 1 tablet by mouth daily.    [provider]  Niacin (VITAMIN B-3 PO) Take 1 tablet by mouth daily.    [provider]  omeprazole (PRILOSEC) 20 MG capsule Take 20 mg by mouth 2 (two) times daily. 11/28/20   [provider]  predniSONE (DELTASONE) 10 MG tablet Take 1 tablet (10 mg total) by mouth daily with breakfast. 09/24/21   Freddi Starr, MD  predniSONE (DELTASONE) 20 MG tablet Take 1 tablet (20 mg total) by mouth daily with breakfast. Patient taking differently: Take 40 mg by mouth daily with breakfast. 09/03/21   Willaim Sheng, MD  rosuvastatin (CRESTOR) 10 MG tablet Take 10 mg by mouth daily.    [provider]  sulfamethoxazole-trimethoprim (BACTRIM DS) 800-160 MG tablet Take 1 tablet by mouth every Monday, Wednesday, and Friday. 09/24/21   Freddi Starr, MD  venlafaxine XR (EFFEXOR-XR) 37.5 MG 24 hr capsule Take 37.5 mg by mouth daily with breakfast.    [provider]      Allergies    Latex, Codeine, Atenolol, Other, Hydrocodone, and Oxycodone    Review of Systems   Review of Systems  Unable to perform ROS: Acuity of condition  Respiratory:  Positive for shortness of breath.   Cardiovascular:  Positive for palpitations. Negative for chest pain.    Physical Exam Updated Vital Signs BP  115/89 (BP Location: Right Arm)   Pulse (!) 146   Resp 18   Ht 1.575 m (5' 2")   Wt 72.6 kg   SpO2 96%   BMI 29.26 kg/m  Physical Exam CONSTITUTIONAL: Anxious HEAD: Normocephalic/atraumatic EYES: EOMI/PERRL ENMT: Mucous membranes moist NECK: supple no meningeal signs SPINE/BACK:entire spine nontender CV: Tachycardic and irregular LUNGS: Crackles bilaterally ABDOMEN: soft, nontender, no rebound or guarding, bowel sounds noted throughout abdomen GU:no cva tenderness NEURO: Pt is awake/alert/appropriate, moves all extremitiesx4.  No facial droop.   EXTREMITIES: pulses normal/equal, full ROM, splint noted to right lower extremity SKIN: warm, color normal PSYCH: Anxious ED Results / Procedures / Treatments   Labs (all labs ordered are listed, but only abnormal results are displayed) Labs Reviewed  CBC - Abnormal; Notable for the following components:  Result Value   WBC 16.6 (*)    MCH 24.6 (*)    RDW 16.9 (*)    All other components within normal limits  COMPREHENSIVE METABOLIC PANEL - Abnormal; Notable for the following components:   Glucose, Bld 113 (*)    Albumin 3.4 (*)    All other components within normal limits  CBG MONITORING, ED  CBG MONITORING, ED  TROPONIN I (HIGH SENSITIVITY)  TROPONIN I (HIGH SENSITIVITY)    EKG EKG Interpretation  Date/Time:  Friday October 08 2021 04:13:41 EDT Ventricular Rate:  137 PR Interval:    QRS Duration: 77 QT Interval:  333 QTC Calculation: 503 R Axis:   53 Text Interpretation: Atrial fibrillation Borderline T wave abnormalities Prolonged QT interval Confirmed by Ripley Fraise [phone]) on 10/08/2021 4:15:25 AM  Radiology DG Chest Portable 1 View  Result Date: 10/08/2021 CLINICAL DATA:  66 year old female with chest pain. History of lymphoma. EXAM: PORTABLE CHEST 1 VIEW COMPARISON:  Chest radiograph 09/24/2021 and earlier. FINDINGS: Widespread bilateral Patchy and confluent abnormal lung opacity by CTA in May appear  significantly improved on radiographs earlier this month. However, on this Portable AP semi upright view at 0504 hours there is recurrent confluent bilateral perihilar opacity, including in the right upper lobe layering along the minor fissure. Mildly lower lung volumes now. Mediastinal contours are stable. Dilated thoracic esophagus again evident. Visualized tracheal air column is within normal limits. No pneumothorax or pulmonary edema. No air bronchograms now, streaky lung base opacity persists since last month. Stable visible upper abdomen with small epigastric clips. No acute osseous abnormality identified. IMPRESSION: 1. Recurrent bilateral perihilar lung opacity since June 2nd, and probable unresolved bilateral lower lung opacity seen by CTA last month. This is indeterminate for recurrent infectious versus non infectious inflammatory, and less likely neoplastic (Lymphoma) etiology. Aspiration (#2) also a consideration. No pulmonary edema or significant pleural effusion. 2. Dilated esophagus as seen on CTA last month. Electronically Signed   By: Genevie Ann M.D.   On: 10/08/2021 05:32    Procedures .Critical Care  Performed by: Ripley Fraise, MD Authorized by: Ripley Fraise, MD   Critical care provider statement:    Critical care time (minutes):  51   Critical care start time:  10/08/2021 4:39 AM   Critical care end time:  10/08/2021 5:30 AM   Critical care time was exclusive of:  Separately billable procedures and treating other patients   Critical care was necessary to treat or prevent imminent or life-threatening deterioration of the following conditions:  Cardiac failure, respiratory failure, metabolic crisis and endocrine crisis   Critical care was time spent personally by me on the following activities:  Evaluation of patient's response to treatment, examination of patient, review of old charts, re-evaluation of patient's condition, pulse oximetry, ordering and review of radiographic  studies, ordering and review of laboratory studies and ordering and performing treatments and interventions   I assumed direction of critical care for this patient from another provider in my specialty: no     Care discussed with: admitting provider       Medications Ordered in ED Medications  diltiazem (CARDIZEM) 125 mg in dextrose 5% 125 mL (1 mg/mL) infusion (has no administration in time range)  cefTRIAXone (ROCEPHIN) 1 g in sodium chloride 0.9 % 100 mL IVPB (has no administration in time range)  azithromycin (ZITHROMAX) 500 mg in sodium chloride 0.9 % 250 mL IVPB (has no administration in time range)  aspirin chewable tablet 324 mg (324 mg  Oral Given 10/08/21 0519)    ED Course/ Medical Decision Making/ A&P Clinical Course as of 10/08/21 8657  Fri Oct 08, 2021  0439 Patient with extensive history presenting with 2 issues.  She is noted to be in A-fib unclear when this started.  Patient also was hypoglycemic that is now improving.  We will follow closely [DW]  0607 Patient found to have worsening infiltrates on x-ray.  She is not on current antibiotics.  She has a history of follicular lymphoma as well as ILD and has been seen by pulmonology.  Due to worsening infiltrates on x-ray as well as leukocytosis, will start with IV antibiotics.  IV Cardizem is also been ordered for A-fib with RVR. [DW]  0608 Patient was last admitted to the family medicine service less than 30 days ago.  Discussed with family medicine team for admit [DW]  0608 Clinically patient is improving, she is not septic appearing. [DW]    Clinical Course User Index [DW] Ripley Fraise, MD       CHA2DS2-VASc Score: 4                    Medical Decision Making Amount and/or Complexity of Data Reviewed Labs: ordered. Radiology: ordered.  Risk OTC drugs. Prescription drug management. Decision regarding hospitalization.   This patient presents to the ED for concern of palpitation, this involves an extensive  number of treatment options, and is a complaint that carries with it a high risk of complications and morbidity.  The differential diagnosis includes but is not limited to atrial fibrillation, ventricular tachycardia, sinus tachycardia, SVT  Comorbidities that complicate the patient evaluation: Patient's presentation is complicated by their history of interstitial lung disease and diabetes   Additional history obtained: Records reviewed previous admission documents  Lab Tests: I Ordered, and personally interpreted labs.  The pertinent results include: Leukocytosis  Imaging Studies ordered: I ordered imaging studies including X-ray chest   I independently visualized and interpreted imaging which showed bilateral infiltrate I agree with the radiologist interpretation  Cardiac Monitoring: The patient was maintained on a cardiac monitor.  I personally viewed and interpreted the cardiac monitor which showed an underlying rhythm of:  Atrial Fibrillation  Medicines ordered and prescription drug management: I ordered medication including IV Cardizem for atrial fibrillation Reevaluation of the patient after these medicines showed that the patient    stayed the same   Critical Interventions:  IV Cardizem, IV antibiotics for pneumonia  Consultations Obtained: I requested consultation with the admitting physician family medicine team , and discussed  findings as well as pertinent plan - they recommend: Admit  Reevaluation: After the interventions noted above, I reevaluated the patient and found that they have :improved  Complexity of problems addressed: Patient's presentation is most consistent with  acute presentation with potential threat to life or bodily function  Disposition: After consideration of the diagnostic results and the patient's response to treatment,  I feel that the patent would benefit from admission   .           Final Clinical Impression(s) / ED  Diagnoses Final diagnoses:  Atrial fibrillation with RVR ([place])  Community acquired pneumonia, unspecified laterality  Hypoglycemia    Rx / DC Orders ED Discharge Orders     None         Ripley Fraise, MD 10/08/21 [phone]

## 2021-10-08 NOTE — Assessment & Plan Note (Addendum)
Remains afebrile without leukocytosis. -Continue ceftriaxone and azithromycin -Follow-up blood culture results (NGTD x2 days) -Home Albuterol prn for wheezing

## 2021-10-08 NOTE — Consult Note (Cosign Needed)
Cardiology Consultation:   Patient ID: Meghan Alexander MRN: [id number]; DOB: May 23, 1955  Admit date: 10/08/2021 Date of Consult: 10/08/2021  PCP:  Meghan Dy, MD   [facility] Providers Cardiologist:  New to [facility] - Dr. Margaretann Alexander   Patient Profile:    Meghan Alexander is a 66 y.o. 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 Erin Alexander.  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  Patient does not have any prior history of cardiac issues and has never seen by cardiology service.  She was admitted in April with acute respiratory failure.  CT imaging showed diffuse groundglass opacity.  She completed a course of outpatient antibiotic without improvement.  Bronchoscopy with BAL of right middle lobe and the right lower lobe showed no malignancy, culture grew strep mitis.  Patient has been treated with antibiotic.  She was admitted in May for acute hypoxic respiratory failure related to COVID-pneumonia.  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  Patient returned back to the ED early this morning due to concern of altered mental status and the hypoglycemia with initial glucose 42.  On arrival, patient was noted to be in A-fib with RVR.  She was started on IV diltiazem.  Chest x-ray showed recurrent up bilateral perihilar lung opacity.  CT of the chest showed no evidence of acute PE, recurrent bilateral groundglass and airspace opacity, dilated esophagus with air-fluid level consistent with achalasia.  Patient was admitted by family medicine service, due to  concern of community-acquired pneumonia, she has been started on antibiotic and the pulmonology service has been consulted.  Cardiology service consulted for A-fib with RVR.   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 (Meghan Alexander) 04/02/2019   GERD (gastroesophageal reflux disease)    hx of reflux-went away with elevating HOB    Hypertension    Pneumonia    hx of    PONV (postoperative nausea and vomiting)    Type 2 diabetes mellitus (Meghan Alexander) 08/02/2021    Past Surgical History:  Procedure Laterality Date   ABDOMINAL HYSTERECTOMY     ADENOIDECTOMY     BRONCHIAL BRUSHINGS  08/03/2021   Procedure: BRONCHIAL BRUSHINGS;  Surgeon: Meghan Gobble, MD;  Location: [place];  Service: Cardiopulmonary;;   BRONCHIAL WASHINGS  08/03/2021   Procedure: BRONCHIAL WASHINGS;  Surgeon: Meghan Gobble, MD;  Location: [place];  Service: Cardiopulmonary;;   cosmetic surgeries     ESOPHAGEAL MANOMETRY N/A 12/02/2015   Procedure: ESOPHAGEAL MANOMETRY (EM);  Surgeon: Meghan Silence, MD;  Location: [facility];  Service: Endoscopy;  Laterality: N/A;   ESOPHAGOGASTRODUODENOSCOPY N/A 12/02/2015   Procedure: ESOPHAGOGASTRODUODENOSCOPY (EGD);  Surgeon: Meghan Silence, MD;  Location: [facility];  Service: Endoscopy;  Laterality: N/A;   lymph node removal left leg     cat scratch surgery    ORIF ANKLE FRACTURE Right 09/03/2021   Procedure: OPEN REDUCTION INTERNAL FIXATION (  ORIF) ANKLE FRACTURE;  Surgeon: Meghan Sheng, MD;  Location: [place];  Service: Orthopedics;  Laterality: Right;   THROAT SURGERY     sleep apnea surgery    TONSILLECTOMY     VIDEO BRONCHOSCOPY  08/03/2021   Procedure: VIDEO BRONCHOSCOPY WITH FLUORO;  Surgeon: Meghan Gobble, MD;  Location: [facility];  Service: Cardiopulmonary;;      Home Medications:  Prior to Admission medications   Medication Sig Start Date End Date Taking? Authorizing Provider  acetaminophen (TYLENOL) 325 MG tablet Take 2 tablets (650 mg total) by mouth every 8 (eight) hours as needed. 09/08/21 10/08/21 Yes Meghan Essex, MD  acyclovir (ZOVIRAX) 400 MG tablet TAKE 1 TABLET(400 MG) BY MOUTH DAILY Patient taking differently: Take 400 mg by mouth daily. 03/24/21  Yes Meghan Genera, MD  albuterol (VENTOLIN HFA) 108 (90 Base) MCG/ACT inhaler Inhale 2 puffs into the lungs daily as needed for shortness of breath. 06/11/21  Yes [provider]  amLODipine (NORVASC) 10 MG tablet Take 10 mg by mouth daily. 09/02/21  Yes [provider]  diphenhydrAMINE (BENADRYL) 25 MG tablet Take 25 mg by mouth at bedtime.   Yes [provider]  feeding supplement (ENSURE ENLIVE / ENSURE PLUS) LIQD Take 237 mLs by mouth 3 (three) times daily between meals. Patient taking differently: Take 237 mLs by mouth 2 (two) times daily between meals. 09/08/21  Yes Meghan Essex, MD  ferrous sulfate 325 (65 FE) MG tablet Take 1 tablet (325 mg total) by mouth every other day. 09/08/21 11/07/21 Yes Meghan Heck, MD  fluticasone (FLONASE) 50 MCG/ACT nasal spray Place 2 sprays into both nostrils daily. 09/24/21  Yes [provider]  HUMALOG KWIKPEN 100 UNIT/ML KwikPen Inject 4 Units into the skin with breakfast, with lunch, and with evening meal. Patient taking differently: Inject 4 Units into the skin with breakfast, with lunch, and with evening meal. 15 units with breakfast and lunch. 10 units before bed. 09/08/21  Yes Meghan Essex, MD  insulin detemir (LEVEMIR) 100 UNIT/ML FlexPen Inject 10 Units into the skin daily. Patient taking differently: Inject 20 Units into the skin daily. 09/08/21  Yes Meghan Essex, MD  ipratropium (ATROVENT) 0.03 % nasal spray Place 1 spray into both nostrils daily. 06/11/21  Yes [provider]   Ipratropium-Albuterol (COMBIVENT) 20-100 MCG/ACT AERS respimat Inhale 1 puff into the lungs every 6 (six) hours as needed for wheezing. 08/09/21  Yes Allie Bossier, MD  losartan (COZAAR) 50 MG tablet Take 50 mg by mouth daily.   Yes [provider]  Melatonin 10 MG CAPS Take 10 mg by mouth at bedtime.    Yes [provider]  metFORMIN (GLUCOPHAGE) 500 MG tablet Take 500 mg by mouth daily with breakfast. 07/23/19  Yes [provider]  Multiple Vitamin (MULTIVITAMIN) tablet Take 1 tablet by mouth daily.   Yes [provider]  Niacin (VITAMIN B-3 PO) Take 1 tablet by mouth daily.   Yes [provider]  omeprazole (PRILOSEC) 20 MG capsule Take 20 mg by mouth daily. 11/28/20  Yes [provider]  predniSONE (DELTASONE) 10 MG tablet Take 1 tablet (10 mg total) by mouth daily with breakfast. 09/24/21  Yes Dewald, Cheryle Horsfall, MD  predniSONE (DELTASONE) 20 MG tablet Take 1 tablet (20 mg total) by mouth daily with breakfast. Patient taking differently: Take 40 mg by mouth daily with breakfast. 09/03/21  Yes Meghan Sheng, MD  rosuvastatin (CRESTOR) 10 MG tablet Take 10 mg by  mouth daily.   Yes [provider]  sulfamethoxazole-trimethoprim (BACTRIM DS) 800-160 MG tablet Take 1 tablet by mouth every Monday, Wednesday, and Friday. 09/24/21  Yes Freddi Starr, MD  venlafaxine XR (EFFEXOR-XR) 37.5 MG 24 hr capsule Take 37.5 mg by mouth daily with breakfast.   Yes [provider]  Accu-Chek Softclix Lancets lancets Use to test blood sugars up to 4 times daily as directed. 08/09/21   Allie Bossier, MD  Blood Glucose Monitoring Suppl (BLOOD GLUCOSE MONITOR SYSTEM) w/Device KIT Use to test blood sugars up to 4 times daily. 08/09/21   Allie Bossier, MD  glucose blood (ACCU-CHEK GUIDE) test strip Use to test blood sugars up to 4 times daily as directed. 08/09/21   Allie Bossier, MD  Insulin Pen Needle 32G X 4 MM MISC Use to inject insulin  up to 4 times daily as directed. 08/09/21   Allie Bossier, MD    Inpatient Medications: Scheduled Meds:  acyclovir  400 mg Oral Daily   amLODipine  10 mg Oral Daily   apixaban  5 mg Oral BID   [START ON 10/09/2021] azithromycin  500 mg Oral Daily   insulin aspart  0-15 Units Subcutaneous TID WC   insulin glargine-yfgn  10 Units Subcutaneous QHS   losartan  50 mg Oral Daily   pantoprazole  40 mg Oral Daily   predniSONE  40 mg Oral Q breakfast   rosuvastatin  10 mg Oral Daily   venlafaxine XR  37.5 mg Oral Q breakfast   Continuous Infusions:  [START ON 10/09/2021] cefTRIAXone (ROCEPHIN)  IV     diltiazem (CARDIZEM) infusion 10 mg/hr (10/08/21 1028)   PRN Meds: acetaminophen **OR** acetaminophen, albuterol  Allergies:    Allergies  Allergen Reactions   Latex Other (See Comments)    Blisters / skin peeling   Codeine Nausea And Vomiting and Other (See Comments)    hallucinations   Atenolol Cough   Other Other (See Comments)    DermaPlast; Used after surgery - caused swelling, itching, and wound broke open   Hydrocodone Nausea And Vomiting   Oxycodone Nausea And Vomiting    Social History:   Social History   Socioeconomic History   Marital status: Married    Spouse name: Not on file   Number of children: Not on file   Years of education: Not on file   Highest education level: Not on file  Occupational History   Not on file  Tobacco Use   Smoking status: Never   Smokeless tobacco: Never  Vaping Use   Vaping Use: Never used  Substance and Sexual Activity   Alcohol use: No    Alcohol/week: 0.0 standard drinks of alcohol   Drug use: No   Sexual activity: Not on file  Other Topics Concern   Not on file  Social History Narrative   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 [employer]   Never smoker never drinker   No drugs   Multiple allergies   Lives at home with her husband of 8 years since 2008   Social Determinants of Health    Financial Resource Strain: Not on file  Food Insecurity: Not on file  Transportation Needs: Not on file  Physical Activity: Not on file  Stress: Not on file  Social Connections: Not on file  Intimate Partner Violence: Not on file    Family History:    Family History  Problem Relation Age of Onset  Lung cancer Father        1982 died from it   Hernia Mother    Breast cancer Maternal Aunt        multiple    Breast cancer Maternal Grandmother    Breast cancer Paternal Grandmother    Breast cancer Cousin        cousins multiple    Colon cancer Neg Hx    Colon polyps Neg Hx    Rectal cancer Neg Hx    Stomach cancer Neg Hx      ROS:  Please see the history of present illness.   All other ROS reviewed and negative.     Physical Exam/Data:   Vitals:   10/08/21 1107 10/08/21 1109 10/08/21 1115 10/08/21 1158  BP: 108/68  109/73 123/90  Pulse: 82  90 (!) 102  Resp: 14  (!) 26 19  Temp:  98.5 F (36.9 C)  98.3 F (36.8 C)  TempSrc:  Oral  Oral  SpO2: 93%  92% 95%  Weight:   75.7 kg   Height:   5' 2.01" (1.575 m)    No intake or output data in the 24 hours ending 10/08/21 1551    10/08/2021   11:15 AM 10/08/2021    4:16 AM 09/06/2021    8:52 AM  Last 3 Weights  Weight (lbs) 166 lb 14.2 oz 160 lb 160 lb  Weight (kg) 75.7 kg 72.576 kg 72.576 kg     Body mass index is 30.52 kg/m.  General:  Well nourished, well developed, in no acute distress HEENT: normal Neck: no JVD Vascular: No carotid bruits; Distal pulses 2+ bilaterally Cardiac:  normal S1, S2; RRR; no murmur  Lungs:  clear to auscultation bilaterally, no wheezing, rhonchi or rales  Abd: soft, nontender, no hepatomegaly  Ext: no edema Musculoskeletal:  No deformities, BUE and BLE strength normal and equal Skin: warm and dry  Neuro:  CNs 2-12 intact, no focal abnormalities noted Psych:  Normal affect   EKG:  The EKG was personally reviewed and demonstrates: Atrial fibrillation with RVR Telemetry:   Telemetry was personally reviewed and demonstrates: Atrial fibrillation with RVR, patient converted to sinus rhythm with heart rate in the upper 80s and 90s around 1:07 PM today.  Relevant CV Studies:  Echo 10/08/2021  1. Left ventricular ejection fraction, by estimation, is 55 to 60%. The  left ventricle has normal function. The left ventricle has no regional  wall motion abnormalities. Left ventricular diastolic parameters are  indeterminate.   2. Right ventricular systolic function is normal. The right ventricular  size is normal.   3. The mitral valve is abnormal. Trivial mitral valve regurgitation. No  evidence of mitral stenosis.   4. The aortic valve is tricuspid. Aortic valve regurgitation is not  visualized. No aortic stenosis is present.   5. The inferior vena cava is dilated in size with >50% respiratory  variability, suggesting right atrial pressure of 8 mmHg.   Laboratory Data:  High Sensitivity Troponin:   Recent Labs  Lab 10/08/21 0507 10/08/21 0600  TROPONINIHS 9 16     Chemistry Recent Labs  Lab 10/08/21 0507  NA 141  K 4.2  CL 100  CO2 26  GLUCOSE 113*  BUN 18  CREATININE 0.91  CALCIUM 9.9  GFRNONAA >60  ANIONGAP 15    Recent Labs  Lab 10/08/21 0507  PROT 6.9  ALBUMIN 3.4*  AST 27  ALT 29  ALKPHOS 81  BILITOT 0.5  Lipids No results for input(s): "CHOL", "TRIG", "HDL", "LABVLDL", "LDLCALC", "CHOLHDL" in the last 168 hours.  Hematology Recent Labs  Lab 10/08/21 0507  WBC 16.6*  RBC 4.88  HGB 12.0  HCT 39.7  MCV 81.4  MCH 24.6*  MCHC 30.2  RDW 16.9*  PLT 268   Thyroid No results for input(s): "TSH", "FREET4" in the last 168 hours.  BNPNo results for input(s): "BNP", "PROBNP" in the last 168 hours.  DDimer No results for input(s): "DDIMER" in the last 168 hours.   Radiology/Studies:  ECHOCARDIOGRAM COMPLETE  Result Date: 10/08/2021    ECHOCARDIOGRAM REPORT   Patient Name:   CIJI Hott Date of Exam: 10/08/2021 Medical Rec #:   [id number]      Height:       62.0 in Accession #:    [id number]     Weight:       160.0 lb Date of Birth:  [birth year]       BSA:          1.739 m Patient Age:    41 years       BP:           114/85 mmHg Patient Gender: F              HR:           105 bpm. Exam Location:  Inpatient Procedure: 2D Echo, Color Doppler and Cardiac Doppler Indications:    R94.31 Abnormal EKG  History:        Patient has no prior history of Echocardiogram examinations.                 Arrythmias:Atrial Fibrillation; Risk Factors:Hypertension and                 Diabetes.  Sonographer:    Raquel Sarna Senior RDCS Referring Phys: [place]  1. Left ventricular ejection fraction, by estimation, is 55 to 60%. The left ventricle has normal function. The left ventricle has no regional wall motion abnormalities. Left ventricular diastolic parameters are indeterminate.  2. Right ventricular systolic function is normal. The right ventricular size is normal.  3. The mitral valve is abnormal. Trivial mitral valve regurgitation. No evidence of mitral stenosis.  4. The aortic valve is tricuspid. Aortic valve regurgitation is not visualized. No aortic stenosis is present.  5. The inferior vena cava is dilated in size with >50% respiratory variability, suggesting right atrial pressure of 8 mmHg. FINDINGS  Left Ventricle: Left ventricular ejection fraction, by estimation, is 55 to 60%. The left ventricle has normal function. The left ventricle has no regional wall motion abnormalities. The left ventricular internal cavity size was normal in size. There is  no left ventricular hypertrophy. Left ventricular diastolic parameters are indeterminate. Right Ventricle: The right ventricular size is normal. No increase in right ventricular wall thickness. Right ventricular systolic function is normal. Left Atrium: Left atrial size was normal in size. Right Atrium: Right atrial size was normal in size. Pericardium: There is no evidence of  pericardial effusion. Mitral Valve: The mitral valve is abnormal. There is mild thickening of the mitral valve leaflet(s). There is mild calcification of the mitral valve leaflet(s). Trivial mitral valve regurgitation. No evidence of mitral valve stenosis. Tricuspid Valve: The tricuspid valve is normal in structure. Tricuspid valve regurgitation is trivial. No evidence of tricuspid stenosis. Aortic Valve: The aortic valve is tricuspid. Aortic valve regurgitation is not visualized. No aortic stenosis is present. Pulmonic Valve: The pulmonic valve  was normal in structure. Pulmonic valve regurgitation is not visualized. No evidence of pulmonic stenosis. Aorta: The aortic root is normal in size and structure. Venous: The inferior vena cava is dilated in size with greater than 50% respiratory variability, suggesting right atrial pressure of 8 mmHg. IAS/Shunts: No atrial level shunt detected by color flow Doppler.  LEFT VENTRICLE PLAX 2D LVIDd:         3.50 cm LVIDs:         2.70 cm LV PW:         1.50 cm LV IVS:        1.10 cm LVOT diam:     2.00 cm LV SV:         34 LV SV Index:   19 LVOT Area:     3.14 cm  RIGHT VENTRICLE RV S prime:     9.61 cm/s TAPSE (M-mode): 1.2 cm LEFT ATRIUM           Index LA diam:      3.60 cm 2.07 cm/m LA Vol (A2C): 40.7 ml 23.41 ml/m LA Vol (A4C): 46.8 ml 26.92 ml/m  AORTIC VALVE LVOT Vmax:   69.47 cm/s LVOT Vmean:  50.067 cm/s LVOT VTI:    0.107 m  AORTA Ao Root diam: 3.00 cm Ao Asc diam:  3.00 cm  SHUNTS Systemic VTI:  0.11 m Systemic Diam: 2.00 cm Jenkins Rouge MD Electronically signed by Jenkins Rouge MD Signature Date/Time: 10/08/2021/11:27:49 AM    Final    CT ANGIO CHEST PE W OR WO CONTRAST  Result Date: 10/08/2021 CLINICAL DATA:  Irregular heartbeat.  Pulmonary embolism suspected. EXAM: CT ANGIOGRAPHY CHEST WITH CONTRAST TECHNIQUE: Multidetector CT imaging of the chest was performed using the standard protocol during bolus administration of intravenous contrast. Multiplanar CT  image reconstructions and MIPs were obtained to evaluate the vascular anatomy. RADIATION DOSE REDUCTION: This exam was performed according to the departmental dose-optimization program which includes automated exposure control, adjustment of the mA and/or kV according to patient size and/or use of iterative reconstruction technique. CONTRAST:  26m OMNIPAQUE IOHEXOL 350 MG/ML SOLN COMPARISON:  Radiographs 10/08/2021.  CTA 09/06/2021. FINDINGS: Cardiovascular: The pulmonary arteries are well opacified with contrast to the level of the subsegmental branches. There is no evidence of acute pulmonary embolism. There is limited opacification of the systemic vessels which demonstrate mild atherosclerosis, but no acute abnormality. The heart size is normal. There is no pericardial effusion. Mediastinum/Nodes: There are no enlarged mediastinal, hilar or axillary lymph nodes.Diffuse esophageal dilatation again noted with associated air-fluid levels. The thyroid gland and trachea appear unremarkable. Lungs/Pleura: No pleural effusion or pneumothorax. Again demonstrated are patchy airspace and ground-glass opacities throughout both lungs. Compared with the previous CT, overall improvement is demonstrated. However, these opacities had partially cleared on radiographs 09/24/2021 and have progressed since then. There is some associated architectural distortion. No confluent airspace opacity or suspicious pulmonary nodule. Upper abdomen:  Calcified splenic granulomas.  No acute findings. Musculoskeletal/Chest wall: There is no chest wall mass or suspicious osseous finding. Bilateral breast implants. Mild degenerative changes in the spine. Review of the MIP images confirms the above findings. IMPRESSION: 1. No evidence of acute pulmonary embolism or other acute vascular findings in the chest. 2. Recurrent/fluctuating bilateral ground-glass and airspace opacities, overall improved compared with prior CT of 1 month ago, but worsened  from radiographs of 2 weeks ago. Differential considerations include atypical infection, non infectious inflammation, drug reaction and atypical edema. Given the fluctuation, lymphomatous involvement less  likely. Radiographic follow up recommended. 3. No adenopathy or pleural effusion. 4. Dilated esophagus with air-fluid levels consistent with achalasia. Electronically Signed   By: Richardean Sale M.D.   On: 10/08/2021 07:59   DG Chest Portable 1 View  Result Date: 10/08/2021 CLINICAL DATA:  66 year old female with chest pain. History of lymphoma. EXAM: PORTABLE CHEST 1 VIEW COMPARISON:  Chest radiograph 09/24/2021 and earlier. FINDINGS: Widespread bilateral Patchy and confluent abnormal lung opacity by CTA in May appear significantly improved on radiographs earlier this month. However, on this Portable AP semi upright view at 0504 hours there is recurrent confluent bilateral perihilar opacity, including in the right upper lobe layering along the minor fissure. Mildly lower lung volumes now. Mediastinal contours are stable. Dilated thoracic esophagus again evident. Visualized tracheal air column is within normal limits. No pneumothorax or pulmonary edema. No air bronchograms now, streaky lung base opacity persists since last month. Stable visible upper abdomen with small epigastric clips. No acute osseous abnormality identified. IMPRESSION: 1. Recurrent bilateral perihilar lung opacity since June 2nd, and probable unresolved bilateral lower lung opacity seen by CTA last month. This is indeterminate for recurrent infectious versus non infectious inflammatory, and less likely neoplastic (Lymphoma) etiology. Aspiration (#2) also a consideration. No pulmonary edema or significant pleural effusion. 2. Dilated esophagus as seen on CTA last month. Electronically Signed   By: Genevie Ann M.D.   On: 10/08/2021 05:32     Assessment and Plan:   Paroxysmal atrial fibrillation with RVR  -Likely related to pulmonary issue.   Started on Eliquis given CHA2DS2-VASc score of 4. -Echocardiogram showed normal EF.  Obtain TSH.  -Hesitant to use beta-blocker given significant pulmonary issue.  Fortunately, she self converted on rate control medication around 1:07 PM this afternoon.  Plan to continue Eliquis and the IV diltiazem.  Once pulmonary issue improved, will change IV diltiazem to PO prior to discharge.  Community-acquired pneumonia: Per primary team  Hypertension: On amlodipine and losartan  Hyperlipidemia: On Crestor 10 mg daily.  Previous LDL 113 on 08/08/2021  DM2  History of ILD pneumonitis: Followed by PCCM as outpatient.  History of achalasia improved on steroid   Risk Assessment/Risk Scores:  =        CHA2DS2-VASc Score = 4   This indicates a 4.8% annual risk of stroke. The patient's score is based upon: CHF History: 0 HTN History: 1 Diabetes History: 1 Stroke History: 0 Vascular Disease History: 0 Age Score: 1 Gender Score: 1         For questions or updates, please contact [place] Please consult [email or web address] for contact info under    Signed, Almyra Deforest, [place]  10/08/2021 3:51 PM  Patient seen and examined with Almyra Deforest PA.  Agree as above, with the following exceptions and changes as noted below. Extremely pleasant 66 yo female with recent ILD flare and PNA on whom we were consulted for atrial fibrillation which has since resolved. Gen: NAD, CV: RRR, no murmurs, Lungs: clear, Abd: soft, Extrem: Warm, well perfused, no edema, Neuro/Psych: alert and oriented x 3, normal mood and affect. All available labs, radiology testing, previous records reviewed. Afib is seen in setting of ILD flare and has resolved. She was on dilt gtt and has been started on eliquis. Ok to continue eliquis. Would consider holding diltiazem now that she is in SR, however low threshold to resume for RVR.   Cardiology will follow as needed over the weekend and through her hospital stay.  Tationna Fullard A  Meghan Loveless,  MD 10/08/21 11:01 PM

## 2021-10-08 NOTE — Progress Notes (Signed)
Echocardiogram 2D Echocardiogram has been performed.  Oneal Deputy Kitara Hebb RDCS 10/08/2021, 10:06 AM

## 2021-10-08 NOTE — Consult Note (Incomplete Revision)
Cardiology Consultation:   Patient ID: Carron Mcmurry MRN: [id number]; DOB: 12-01-1955  Admit date: 10/08/2021 Date of Consult: 10/08/2021  PCP:  [name], MD   [facility] Providers Cardiologist:  New to [facility] - Dr. Margaretann Loveless   Patient Profile:   Diesha Rostad is a 66 y.o. 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 Erin Hearing.  History of Present Illness:   Ms. 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  Patient does not have any prior history of cardiac issues and has never seen by cardiology service.  She was admitted in April with acute respiratory failure.  CT imaging showed diffuse groundglass opacity.  She completed a course of outpatient antibiotic without improvement.  Bronchoscopy with BAL of right middle lobe and the right lower lobe showed no malignancy, culture grew strep mitis.  Patient has been treated with antibiotic.  She was admitted in May for acute hypoxic respiratory failure related to COVID-pneumonia.  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  Patient returned back to the ED early this morning due to concern of altered mental status and the hypoglycemia with initial glucose 42.  On arrival, patient was noted to be in A-fib with RVR.  She was started on IV diltiazem.  Chest x-ray showed recurrent up bilateral perihilar lung opacity.  CT of the chest showed no evidence of acute PE, recurrent bilateral groundglass and airspace opacity, dilated esophagus with air-fluid level consistent with achalasia.  Patient was admitted by family medicine service, due to  concern of community-acquired pneumonia, she has been started on antibiotic and the pulmonology service has been consulted.  Cardiology service consulted for A-fib with RVR.   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 ([place]) 04/02/2019   GERD (gastroesophageal reflux disease)    hx of reflux-went away with elevating HOB    Hypertension    Pneumonia    hx of    PONV (postoperative nausea and vomiting)    Type 2 diabetes mellitus ([place]) 08/02/2021    Past Surgical History:  Procedure Laterality Date   ABDOMINAL HYSTERECTOMY     ADENOIDECTOMY     BRONCHIAL BRUSHINGS  08/03/2021   Procedure: BRONCHIAL BRUSHINGS;  Surgeon: Collene Gobble, MD;  Location: [place];  Service: Cardiopulmonary;;   BRONCHIAL WASHINGS  08/03/2021   Procedure: BRONCHIAL WASHINGS;  Surgeon: Collene Gobble, MD;  Location: [place];  Service: Cardiopulmonary;;   cosmetic surgeries     ESOPHAGEAL MANOMETRY N/A 12/02/2015   Procedure: ESOPHAGEAL MANOMETRY (EM);  Surgeon: [name], MD;  Location: [facility];  Service: Endoscopy;  Laterality: N/A;   ESOPHAGOGASTRODUODENOSCOPY N/A 12/02/2015   Procedure: ESOPHAGOGASTRODUODENOSCOPY (EGD);  Surgeon: [name], MD;  Location: [facility];  Service: Endoscopy;  Laterality: N/A;   lymph node removal left leg     cat scratch surgery    ORIF ANKLE FRACTURE Right 09/03/2021   Procedure: OPEN REDUCTION INTERNAL FIXATION (  ORIF) ANKLE FRACTURE;  Surgeon: Willaim Sheng, MD;  Location: [place];  Service: Orthopedics;  Laterality: Right;   THROAT SURGERY     sleep apnea surgery    TONSILLECTOMY     VIDEO BRONCHOSCOPY  08/03/2021   Procedure: VIDEO BRONCHOSCOPY WITH FLUORO;  Surgeon: Collene Gobble, MD;  Location: [facility];  Service: Cardiopulmonary;;      Home Medications:  Prior to Admission medications   Medication Sig Start Date End Date Taking? Authorizing Provider  acetaminophen (TYLENOL) 325 MG tablet Take 2 tablets (650 mg total) by mouth every 8 (eight) hours as needed. 09/08/21 10/08/21 Yes Ezequiel Essex, MD  acyclovir (ZOVIRAX) 400 MG tablet TAKE 1 TABLET(400 MG) BY MOUTH DAILY Patient taking differently: Take 400 mg by mouth daily. 03/24/21  Yes Brunetta Genera, MD  albuterol (VENTOLIN HFA) 108 (90 Base) MCG/ACT inhaler Inhale 2 puffs into the lungs daily as needed for shortness of breath. 06/11/21  Yes [provider]  amLODipine (NORVASC) 10 MG tablet Take 10 mg by mouth daily. 09/02/21  Yes [provider]  diphenhydrAMINE (BENADRYL) 25 MG tablet Take 25 mg by mouth at bedtime.   Yes [provider]  feeding supplement (ENSURE ENLIVE / ENSURE PLUS) LIQD Take 237 mLs by mouth 3 (three) times daily between meals. Patient taking differently: Take 237 mLs by mouth 2 (two) times daily between meals. 09/08/21  Yes Ezequiel Essex, MD  ferrous sulfate 325 (65 FE) MG tablet Take 1 tablet (325 mg total) by mouth every other day. 09/08/21 11/07/21 Yes Gerrit Heck, MD  fluticasone (FLONASE) 50 MCG/ACT nasal spray Place 2 sprays into both nostrils daily. 09/24/21  Yes [provider]  HUMALOG KWIKPEN 100 UNIT/ML KwikPen Inject 4 Units into the skin with breakfast, with lunch, and with evening meal. Patient taking differently: Inject 4 Units into the skin with breakfast, with lunch, and with evening meal. 15 units with breakfast and lunch. 10 units before bed. 09/08/21  Yes Ezequiel Essex, MD  insulin detemir (LEVEMIR) 100 UNIT/ML FlexPen Inject 10 Units into the skin daily. Patient taking differently: Inject 20 Units into the skin daily. 09/08/21  Yes Ezequiel Essex, MD  ipratropium (ATROVENT) 0.03 % nasal spray Place 1 spray into both nostrils daily. 06/11/21  Yes [provider]   Ipratropium-Albuterol (COMBIVENT) 20-100 MCG/ACT AERS respimat Inhale 1 puff into the lungs every 6 (six) hours as needed for wheezing. 08/09/21  Yes Allie Bossier, MD  losartan (COZAAR) 50 MG tablet Take 50 mg by mouth daily.   Yes [provider]  Melatonin 10 MG CAPS Take 10 mg by mouth at bedtime.    Yes [provider]  metFORMIN (GLUCOPHAGE) 500 MG tablet Take 500 mg by mouth daily with breakfast. 07/23/19  Yes [provider]  Multiple Vitamin (MULTIVITAMIN) tablet Take 1 tablet by mouth daily.   Yes [provider]  Niacin (VITAMIN B-3 PO) Take 1 tablet by mouth daily.   Yes [provider]  omeprazole (PRILOSEC) 20 MG capsule Take 20 mg by mouth daily. 11/28/20  Yes [provider]  predniSONE (DELTASONE) 10 MG tablet Take 1 tablet (10 mg total) by mouth daily with breakfast. 09/24/21  Yes Dewald, Cheryle Horsfall, MD  predniSONE (DELTASONE) 20 MG tablet Take 1 tablet (20 mg total) by mouth daily with breakfast. Patient taking differently: Take 40 mg by mouth daily with breakfast. 09/03/21  Yes Willaim Sheng, MD  rosuvastatin (CRESTOR) 10 MG tablet Take 10 mg by  mouth daily.   Yes [provider]  sulfamethoxazole-trimethoprim (BACTRIM DS) 800-160 MG tablet Take 1 tablet by mouth every Monday, Wednesday, and Friday. 09/24/21  Yes Freddi Starr, MD  venlafaxine XR (EFFEXOR-XR) 37.5 MG 24 hr capsule Take 37.5 mg by mouth daily with breakfast.   Yes [provider]  Accu-Chek Softclix Lancets lancets Use to test blood sugars up to 4 times daily as directed. 08/09/21   Allie Bossier, MD  Blood Glucose Monitoring Suppl (BLOOD GLUCOSE MONITOR SYSTEM) w/Device KIT Use to test blood sugars up to 4 times daily. 08/09/21   Allie Bossier, MD  glucose blood (ACCU-CHEK GUIDE) test strip Use to test blood sugars up to 4 times daily as directed. 08/09/21   Allie Bossier, MD  Insulin Pen Needle 32G X 4 MM MISC Use to inject insulin  up to 4 times daily as directed. 08/09/21   Allie Bossier, MD    Inpatient Medications: Scheduled Meds:  acyclovir  400 mg Oral Daily   amLODipine  10 mg Oral Daily   apixaban  5 mg Oral BID   [START ON 10/09/2021] azithromycin  500 mg Oral Daily   insulin aspart  0-15 Units Subcutaneous TID WC   insulin glargine-yfgn  10 Units Subcutaneous QHS   losartan  50 mg Oral Daily   pantoprazole  40 mg Oral Daily   predniSONE  40 mg Oral Q breakfast   rosuvastatin  10 mg Oral Daily   venlafaxine XR  37.5 mg Oral Q breakfast   Continuous Infusions:  [START ON 10/09/2021] cefTRIAXone (ROCEPHIN)  IV     diltiazem (CARDIZEM) infusion 10 mg/hr (10/08/21 1028)   PRN Meds: acetaminophen **OR** acetaminophen, albuterol  Allergies:    Allergies  Allergen Reactions   Latex Other (See Comments)    Blisters / skin peeling   Codeine Nausea And Vomiting and Other (See Comments)    hallucinations   Atenolol Cough   Other Other (See Comments)    DermaPlast; Used after surgery - caused swelling, itching, and wound broke open   Hydrocodone Nausea And Vomiting   Oxycodone Nausea And Vomiting    Social History:   Social History   Socioeconomic History   Marital status: Married    Spouse name: Not on file   Number of children: Not on file   Years of education: Not on file   Highest education level: Not on file  Occupational History   Not on file  Tobacco Use   Smoking status: Never   Smokeless tobacco: Never  Vaping Use   Vaping Use: Never used  Substance and Sexual Activity   Alcohol use: No    Alcohol/week: 0.0 standard drinks of alcohol   Drug use: No   Sexual activity: Not on file  Other Topics Concern   Not on file  Social History Narrative   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 [employer]   Never smoker never drinker   No drugs   Multiple allergies   Lives at home with her husband of 8 years since 2008   Social Determinants of Health    Financial Resource Strain: Not on file  Food Insecurity: Not on file  Transportation Needs: Not on file  Physical Activity: Not on file  Stress: Not on file  Social Connections: Not on file  Intimate Partner Violence: Not on file    Family History:    Family History  Problem Relation Age of Onset  Lung cancer Father        1982 died from it   Hernia Mother    Breast cancer Maternal Aunt        multiple    Breast cancer Maternal Grandmother    Breast cancer Paternal Grandmother    Breast cancer Cousin        cousins multiple    Colon cancer Neg Hx    Colon polyps Neg Hx    Rectal cancer Neg Hx    Stomach cancer Neg Hx      ROS:  Please see the history of present illness.   All other ROS reviewed and negative.     Physical Exam/Data:   Vitals:   10/08/21 1107 10/08/21 1109 10/08/21 1115 10/08/21 1158  BP: 108/68  109/73 123/90  Pulse: 82  90 (!) 102  Resp: 14  (!) 26 19  Temp:  98.5 F (36.9 C)  98.3 F (36.8 C)  TempSrc:  Oral  Oral  SpO2: 93%  92% 95%  Weight:   75.7 kg   Height:   5' 2.01" (1.575 m)    No intake or output data in the 24 hours ending 10/08/21 1551    10/08/2021   11:15 AM 10/08/2021    4:16 AM 09/06/2021    8:52 AM  Last 3 Weights  Weight (lbs) 166 lb 14.2 oz 160 lb 160 lb  Weight (kg) 75.7 kg 72.576 kg 72.576 kg     Body mass index is 30.52 kg/m.  General:  Well nourished, well developed, in no acute distress HEENT: normal Neck: no JVD Vascular: No carotid bruits; Distal pulses 2+ bilaterally Cardiac:  normal S1, S2; RRR; no murmur  Lungs:  clear to auscultation bilaterally, no wheezing, rhonchi or rales  Abd: soft, nontender, no hepatomegaly  Ext: no edema Musculoskeletal:  No deformities, BUE and BLE strength normal and equal Skin: warm and dry  Neuro:  CNs 2-12 intact, no focal abnormalities noted Psych:  Normal affect   EKG:  The EKG was personally reviewed and demonstrates: Atrial fibrillation with RVR Telemetry:   Telemetry was personally reviewed and demonstrates: Atrial fibrillation with RVR, patient converted to sinus rhythm with heart rate in the upper 80s and 90s around 1:07 PM today.  Relevant CV Studies:  Echo 10/08/2021  1. Left ventricular ejection fraction, by estimation, is 55 to 60%. The  left ventricle has normal function. The left ventricle has no regional  wall motion abnormalities. Left ventricular diastolic parameters are  indeterminate.   2. Right ventricular systolic function is normal. The right ventricular  size is normal.   3. The mitral valve is abnormal. Trivial mitral valve regurgitation. No  evidence of mitral stenosis.   4. The aortic valve is tricuspid. Aortic valve regurgitation is not  visualized. No aortic stenosis is present.   5. The inferior vena cava is dilated in size with >50% respiratory  variability, suggesting right atrial pressure of 8 mmHg.   Laboratory Data:  High Sensitivity Troponin:   Recent Labs  Lab 10/08/21 0507 10/08/21 0600  TROPONINIHS 9 16     Chemistry Recent Labs  Lab 10/08/21 0507  NA 141  K 4.2  CL 100  CO2 26  GLUCOSE 113*  BUN 18  CREATININE 0.91  CALCIUM 9.9  GFRNONAA >60  ANIONGAP 15    Recent Labs  Lab 10/08/21 0507  PROT 6.9  ALBUMIN 3.4*  AST 27  ALT 29  ALKPHOS 81  BILITOT 0.5  Lipids No results for input(s): "CHOL", "TRIG", "HDL", "LABVLDL", "LDLCALC", "CHOLHDL" in the last 168 hours.  Hematology Recent Labs  Lab 10/08/21 0507  WBC 16.6*  RBC 4.88  HGB 12.0  HCT 39.7  MCV 81.4  MCH 24.6*  MCHC 30.2  RDW 16.9*  PLT 268   Thyroid No results for input(s): "TSH", "FREET4" in the last 168 hours.  BNPNo results for input(s): "BNP", "PROBNP" in the last 168 hours.  DDimer No results for input(s): "DDIMER" in the last 168 hours.   Radiology/Studies:  ECHOCARDIOGRAM COMPLETE  Result Date: 10/08/2021    ECHOCARDIOGRAM REPORT   Patient Name:   TEIGEN Tugwell Date of Exam: 10/08/2021 Medical Rec #:   [id number]      Height:       62.0 in Accession #:    [id number]     Weight:       160.0 lb Date of Birth:  Dec 13, 1955       BSA:          1.739 m Patient Age:    1 years       BP:           114/85 mmHg Patient Gender: F              HR:           105 bpm. Exam Location:  Inpatient Procedure: 2D Echo, Color Doppler and Cardiac Doppler Indications:    R94.31 Abnormal EKG  History:        Patient has no prior history of Echocardiogram examinations.                 Arrythmias:Atrial Fibrillation; Risk Factors:Hypertension and                 Diabetes.  Sonographer:    Raquel Sarna Senior RDCS Referring Phys: [place]  1. Left ventricular ejection fraction, by estimation, is 55 to 60%. The left ventricle has normal function. The left ventricle has no regional wall motion abnormalities. Left ventricular diastolic parameters are indeterminate.  2. Right ventricular systolic function is normal. The right ventricular size is normal.  3. The mitral valve is abnormal. Trivial mitral valve regurgitation. No evidence of mitral stenosis.  4. The aortic valve is tricuspid. Aortic valve regurgitation is not visualized. No aortic stenosis is present.  5. The inferior vena cava is dilated in size with >50% respiratory variability, suggesting right atrial pressure of 8 mmHg. FINDINGS  Left Ventricle: Left ventricular ejection fraction, by estimation, is 55 to 60%. The left ventricle has normal function. The left ventricle has no regional wall motion abnormalities. The left ventricular internal cavity size was normal in size. There is  no left ventricular hypertrophy. Left ventricular diastolic parameters are indeterminate. Right Ventricle: The right ventricular size is normal. No increase in right ventricular wall thickness. Right ventricular systolic function is normal. Left Atrium: Left atrial size was normal in size. Right Atrium: Right atrial size was normal in size. Pericardium: There is no evidence of  pericardial effusion. Mitral Valve: The mitral valve is abnormal. There is mild thickening of the mitral valve leaflet(s). There is mild calcification of the mitral valve leaflet(s). Trivial mitral valve regurgitation. No evidence of mitral valve stenosis. Tricuspid Valve: The tricuspid valve is normal in structure. Tricuspid valve regurgitation is trivial. No evidence of tricuspid stenosis. Aortic Valve: The aortic valve is tricuspid. Aortic valve regurgitation is not visualized. No aortic stenosis is present. Pulmonic Valve: The pulmonic valve  was normal in structure. Pulmonic valve regurgitation is not visualized. No evidence of pulmonic stenosis. Aorta: The aortic root is normal in size and structure. Venous: The inferior vena cava is dilated in size with greater than 50% respiratory variability, suggesting right atrial pressure of 8 mmHg. IAS/Shunts: No atrial level shunt detected by color flow Doppler.  LEFT VENTRICLE PLAX 2D LVIDd:         3.50 cm LVIDs:         2.70 cm LV PW:         1.50 cm LV IVS:        1.10 cm LVOT diam:     2.00 cm LV SV:         34 LV SV Index:   19 LVOT Area:     3.14 cm  RIGHT VENTRICLE RV S prime:     9.61 cm/s TAPSE (M-mode): 1.2 cm LEFT ATRIUM           Index LA diam:      3.60 cm 2.07 cm/m LA Vol (A2C): 40.7 ml 23.41 ml/m LA Vol (A4C): 46.8 ml 26.92 ml/m  AORTIC VALVE LVOT Vmax:   69.47 cm/s LVOT Vmean:  50.067 cm/s LVOT VTI:    0.107 m  AORTA Ao Root diam: 3.00 cm Ao Asc diam:  3.00 cm  SHUNTS Systemic VTI:  0.11 m Systemic Diam: 2.00 cm Jenkins Rouge MD Electronically signed by Jenkins Rouge MD Signature Date/Time: 10/08/2021/11:27:49 AM    Final    CT ANGIO CHEST PE W OR WO CONTRAST  Result Date: 10/08/2021 CLINICAL DATA:  Irregular heartbeat.  Pulmonary embolism suspected. EXAM: CT ANGIOGRAPHY CHEST WITH CONTRAST TECHNIQUE: Multidetector CT imaging of the chest was performed using the standard protocol during bolus administration of intravenous contrast. Multiplanar CT  image reconstructions and MIPs were obtained to evaluate the vascular anatomy. RADIATION DOSE REDUCTION: This exam was performed according to the departmental dose-optimization program which includes automated exposure control, adjustment of the mA and/or kV according to patient size and/or use of iterative reconstruction technique. CONTRAST:  26m OMNIPAQUE IOHEXOL 350 MG/ML SOLN COMPARISON:  Radiographs 10/08/2021.  CTA 09/06/2021. FINDINGS: Cardiovascular: The pulmonary arteries are well opacified with contrast to the level of the subsegmental branches. There is no evidence of acute pulmonary embolism. There is limited opacification of the systemic vessels which demonstrate mild atherosclerosis, but no acute abnormality. The heart size is normal. There is no pericardial effusion. Mediastinum/Nodes: There are no enlarged mediastinal, hilar or axillary lymph nodes.Diffuse esophageal dilatation again noted with associated air-fluid levels. The thyroid gland and trachea appear unremarkable. Lungs/Pleura: No pleural effusion or pneumothorax. Again demonstrated are patchy airspace and ground-glass opacities throughout both lungs. Compared with the previous CT, overall improvement is demonstrated. However, these opacities had partially cleared on radiographs 09/24/2021 and have progressed since then. There is some associated architectural distortion. No confluent airspace opacity or suspicious pulmonary nodule. Upper abdomen:  Calcified splenic granulomas.  No acute findings. Musculoskeletal/Chest wall: There is no chest wall mass or suspicious osseous finding. Bilateral breast implants. Mild degenerative changes in the spine. Review of the MIP images confirms the above findings. IMPRESSION: 1. No evidence of acute pulmonary embolism or other acute vascular findings in the chest. 2. Recurrent/fluctuating bilateral ground-glass and airspace opacities, overall improved compared with prior CT of 1 month ago, but worsened  from radiographs of 2 weeks ago. Differential considerations include atypical infection, non infectious inflammation, drug reaction and atypical edema. Given the fluctuation, lymphomatous involvement less  likely. Radiographic follow up recommended. 3. No adenopathy or pleural effusion. 4. Dilated esophagus with air-fluid levels consistent with achalasia. Electronically Signed   By: Richardean Sale M.D.   On: 10/08/2021 07:59   DG Chest Portable 1 View  Result Date: 10/08/2021 CLINICAL DATA:  66 year old female with chest pain. History of lymphoma. EXAM: PORTABLE CHEST 1 VIEW COMPARISON:  Chest radiograph 09/24/2021 and earlier. FINDINGS: Widespread bilateral Patchy and confluent abnormal lung opacity by CTA in May appear significantly improved on radiographs earlier this month. However, on this Portable AP semi upright view at 0504 hours there is recurrent confluent bilateral perihilar opacity, including in the right upper lobe layering along the minor fissure. Mildly lower lung volumes now. Mediastinal contours are stable. Dilated thoracic esophagus again evident. Visualized tracheal air column is within normal limits. No pneumothorax or pulmonary edema. No air bronchograms now, streaky lung base opacity persists since last month. Stable visible upper abdomen with small epigastric clips. No acute osseous abnormality identified. IMPRESSION: 1. Recurrent bilateral perihilar lung opacity since June 2nd, and probable unresolved bilateral lower lung opacity seen by CTA last month. This is indeterminate for recurrent infectious versus non infectious inflammatory, and less likely neoplastic (Lymphoma) etiology. Aspiration (#2) also a consideration. No pulmonary edema or significant pleural effusion. 2. Dilated esophagus as seen on CTA last month. Electronically Signed   By: Genevie Ann M.D.   On: 10/08/2021 05:32     Assessment and Plan:   Paroxysmal atrial fibrillation with RVR  -Likely related to pulmonary issue.   Started on Eliquis given CHA2DS2-VASc score of 4. -Echocardiogram showed normal EF.  Obtain TSH.  -Hesitant to use beta-blocker given significant pulmonary issue.  Fortunately, she self converted on rate control medication around 1:07 PM this afternoon.  Plan to continue Eliquis and the IV diltiazem.  Once pulmonary issue improved, will change IV diltiazem to PO prior to discharge.  Community-acquired pneumonia: Per primary team  Hypertension: On amlodipine and losartan  Hyperlipidemia: On Crestor 10 mg daily.  Previous LDL 113 on 08/08/2021  DM2  History of ILD pneumonitis: Followed by PCCM as outpatient.  History of achalasia improved on steroid   Risk Assessment/Risk Scores:  =        CHA2DS2-VASc Score = 4   This indicates a 4.8% annual risk of stroke. The patient's score is based upon: CHF History: 0 HTN History: 1 Diabetes History: 1 Stroke History: 0 Vascular Disease History: 0 Age Score: 1 Gender Score: 1         For questions or updates, please contact [place] Please consult [email or web address] for contact info under    Signed, Almyra Deforest, [place]  10/08/2021 3:51 PM  Patient seen and examined with Almyra Deforest PA.  Agree as above, with the following exceptions and changes as noted below. Extremely pleasant 66 yo female with recent ILD flare on whom we were consulted for . Gen: NAD, CV: RRR, no murmurs, Lungs: clear, Abd: soft, Extrem: Warm, well perfused, no edema, Neuro/Psych: alert and oriented x 3, normal mood and affect. All available labs, radiology testing, previous records reviewed. ***  Elouise Munroe, MD 10/08/21 11:01 PM

## 2021-10-08 NOTE — Assessment & Plan Note (Addendum)
New onset atrial fibrillation found on presentation to the ED. Patient is currently on cardizem gtt and while otherwise stable, is continuing to have tachycardia to 150s. CHADS2-VAS2C score of 4, indicating need for anticoagulation, started apixaban. Suspect new onset afib is most likely due to underlying infectious cause of pneumonia (see below). As patient has complex history and is not easily controlled on cardizem gtt, will place cardiology consult. - Admit to [facility], Dr. Erin Hearing, progressive - Cardizem gtt, titration goal of HR 90-105, MAP >65 - Consult cardiology, appreciate recommendations - Continuous cardiac monitoring - Start apixaban 5 mg BID

## 2021-10-08 NOTE — TOC Benefit Eligibility Note (Signed)
Patient Advocate Encounter ? ?Insurance verification completed.   ? ?The patient is currently admitted and upon discharge could be taking Eliquis 5 mg. ? ?The current 30 day co-pay is, $47.00.  ? ?The patient is currently admitted and upon discharge could be taking Xarelto 20 mg. ? ?The current 30 day co-pay is, $47.00.  ? ?The patient is insured through [employer] Medicare Part D  ? ? ? ?Meghan Alexander, CPhT ?Pharmacy Patient Advocate Specialist ?[facility] Patient Advocate Team ?Direct Number: [phone]  Fax: [phone] ? ? ? ? ? ?  ?

## 2021-10-08 NOTE — Assessment & Plan Note (Signed)
Stable and non weight bearing for now. - WBAT - Tylenol 650 mg q6h prn  - PT/OT eval and treat

## 2021-10-08 NOTE — Consult Note (Signed)
NAME:  Meghan Alexander, MRN:  [id number], DOB:  04/06/1956, LOS: 0 ADMISSION DATE:  10/08/2021, CONSULTATION DATE: 10/08/2021 REFERRING MD: [facility], CHIEF COMPLAINT: Atrial fibrillation ventricular response  History of Present Illness:  66 year old female with multiple health issues and complex past pulmonary history.  She is recently been intubated for COVID, community-acquired pneumonia with oxygen dependent.  She is followed by Dr. Madie Reno.  She has several days of fevers chills hypoxia and confusion.  Her husband called EMS she is transported to [facility] with failure to be in atrial febrile ventricular response at the time of pickup.  She has been treated with Cardizem chest x-ray revealed infection and therefore she was continued and broadened on antimicrobial therapy.  Due to the complexity of her past medical pulmonary history pulmonary was asked to evaluate and follow along with this admission.  She will be seen by Dr. Ina Homes.  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 rate (!) 29, height _0  (1.575 m), weight 72.6 kg, SpO2 94 %.       No intake or output data  in the 24 hours ending 10/08/21 1036 Filed Weights   10/08/21 0416  Weight: 72.6 kg    Examination: General: Obese female no acute distress at rest HENT: No JVD or lymphadenopathy is appreciated Lungs: Coarse rhonchi bilaterally Cardiovascular: Heart sounds are irregular Abdomen: Soft and nontender Extremities: Right lower extremity with soft Neuro: Grossly intact without focal defect GU: [facility] Problem list   Is at  Assessment & Plan:  Recurrent atrial fibrillation with rapid ventricular response coupled with hypoxia with a complex past medical history especially pulmonary. Cardizem drip per primary Blood pressure is adequate Sats are adequate  Complex pulmonary history which includes interstitial lung disease, groundglass opacities, history of multiple pneumonias refractory to treatment complicated by follicular lymphoma and achalasia.  She was scheduled for surgical intervention for achalasia 09/07/2021 but notes that she started prednisone she said no complications of achalasia and can eat anything and everything she wants.  She has a history of COVID.  She has been O2 dependent in the past.  She is followed by Dr. Madie Reno of the pulmonary division.  We have been asked to follow her in the hospital. Agree with antimicrobial therapy she was having chills and sweats prior to admission Panculture Continue steroids Serial chest x-rays she has had a recent fiberoptic bronchoscopy that did not show malignant cells.  Diabetes mellitus poorly controlled secondary to steroids CBG (last 3)  Recent Labs    10/08/21 0908 10/08/21 1029 10/08/21 1056  GLUCAP 142* 220* 200*    Sliding-scale insulin protocol    Right-sided ankle fracture dressing intact Per primary  Best Practice (right click and "Reselect all SmartList Selections" daily)   Diet/type: dysphagia diet (see orders) DVT prophylaxis: not indicated GI prophylaxis: PPI Lines: N/A Foley:   N/A Code Status:  full code Last date of multidisciplinary goals of care discussion [tbd]  Labs   CBC: Recent Labs  Lab 10/08/21 0507  WBC 16.6*  HGB 12.0  HCT 39.7  MCV 81.4  PLT 193    Basic Metabolic Panel: Recent Labs  Lab 10/08/21 0507  NA 141  K 4.2  CL 100  CO2 26  GLUCOSE 113*  BUN 18  CREATININE 0.91  CALCIUM 9.9   GFR: Estimated Creatinine Clearance: 55.2 mL/min (by C-G formula based on SCr of 0.91 mg/dL). Recent Labs  Lab 10/08/21 0507  WBC 16.6*    Liver Function Tests: Recent Labs  Lab 10/08/21 0507  AST 27  ALT 29  ALKPHOS 81  BILITOT 0.5  PROT 6.9  ALBUMIN 3.4*   No results for input(s): "LIPASE", "AMYLASE" in the last 168 hours. No results for input(s): "AMMONIA" in the last 168 hours.  ABG No results found for: "PHART", "PCO2ART", "PO2ART", "HCO3", "TCO2", "ACIDBASEDEF", "O2SAT"   Coagulation Profile: No results for input(s): "INR", "PROTIME" in the last 168 hours.  Cardiac Enzymes: No results for input(s): "CKTOTAL", "CKMB", "CKMBINDEX", "TROPONINI" in the last 168 hours.  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 ([place]) 04/02/2019   GERD (gastroesophageal reflux disease)    hx of reflux-went away with elevating HOB    Hypertension    Pneumonia    hx of    PONV (postoperative nausea and vomiting)    Type 2  diabetes mellitus ([place]) 08/02/2021     Past Medical History:  She,  has a past medical history of Allergy, Arthritis, Bronchitis, Complication of anesthesia, 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   Surgical History:   Past Surgical History:  Procedure Laterality Date   ABDOMINAL HYSTERECTOMY     ADENOIDECTOMY     BRONCHIAL BRUSHINGS  08/03/2021   Procedure: BRONCHIAL BRUSHINGS;  Surgeon: Collene Gobble, MD;  Location: [place];  Service: Cardiopulmonary;;   BRONCHIAL WASHINGS  08/03/2021   Procedure: BRONCHIAL WASHINGS;  Surgeon: Collene Gobble, MD;  Location: [facility] ENDOSCOPY;  Service: Cardiopulmonary;;   cosmetic surgeries     ESOPHAGEAL MANOMETRY N/A 12/02/2015   Procedure: ESOPHAGEAL MANOMETRY (EM);  Surgeon: [name],  MD;  Location: [facility] ENDOSCOPY;  Service: Endoscopy;  Laterality: N/A;   ESOPHAGOGASTRODUODENOSCOPY N/A 12/02/2015   Procedure: ESOPHAGOGASTRODUODENOSCOPY (EGD);  Surgeon: [name], MD;  Location: [facility] ENDOSCOPY;  Service: Endoscopy;  Laterality: N/A;   lymph node removal left leg     cat scratch surgery    ORIF ANKLE FRACTURE Right 09/03/2021   Procedure: OPEN REDUCTION INTERNAL FIXATION (ORIF) ANKLE FRACTURE;  Surgeon: Willaim Sheng, MD;  Location: [place];  Service: Orthopedics;  Laterality: Right;   THROAT SURGERY     sleep apnea surgery    TONSILLECTOMY     VIDEO BRONCHOSCOPY  08/03/2021   Procedure: VIDEO BRONCHOSCOPY WITH FLUORO;  Surgeon: Collene Gobble, MD;  Location: [facility] ENDOSCOPY;  Service: Cardiopulmonary;;     Social History:   reports that she has never smoked. She has never used smokeless tobacco. She reports that she does not drink alcohol and does not use drugs.   Family History:  Her family history includes Breast cancer in her cousin,  maternal aunt, maternal grandmother, and paternal grandmother; Hernia in her mother; Lung cancer in her father. There is no history of Colon cancer, Colon polyps, Rectal cancer, or Stomach cancer.   Allergies Allergies  Allergen Reactions   Latex Other (See Comments)    Blisters / skin peeling   Codeine Nausea And Vomiting and Other (See Comments)    hallucinations   Atenolol Cough   Other Other (See Comments)    DermaPlast; Used after surgery - caused swelling, itching, and wound broke open   Hydrocodone Nausea And Vomiting   Oxycodone Nausea And Vomiting     Home Medications  Prior to Admission medications   Medication Sig Start Date End Date Taking? Authorizing Provider  acetaminophen (TYLENOL) 325 MG tablet Take 2 tablets (650 mg total) by mouth every 8 (eight) hours as needed. 09/08/21 10/08/21 Yes Ezequiel Essex, MD  acyclovir (ZOVIRAX) 400 MG tablet TAKE 1 TABLET(400 MG) BY MOUTH DAILY Patient taking differently: Take 400 mg by mouth daily. 03/24/21  Yes Brunetta Genera, MD  albuterol (VENTOLIN HFA) 108 (90 Base) MCG/ACT inhaler Inhale 2 puffs into the lungs daily as needed for shortness of breath. 06/11/21  Yes [provider]  amLODipine (NORVASC) 10 MG tablet Take 10 mg by mouth daily. 09/02/21  Yes [provider]  diphenhydrAMINE (BENADRYL) 25 MG tablet Take 25 mg by mouth at bedtime.   Yes [provider]  feeding supplement (ENSURE ENLIVE / ENSURE PLUS) LIQD Take 237 mLs by mouth 3 (three) times daily between meals. Patient taking differently: Take 237 mLs by mouth 2 (two) times daily between meals. 09/08/21  Yes Ezequiel Essex, MD  ferrous sulfate 325 (65 FE) MG tablet Take 1 tablet (325 mg total) by mouth every other day. 09/08/21 11/07/21 Yes Gerrit Heck, MD  fluticasone (FLONASE) 50 MCG/ACT nasal spray Place 2 sprays into both nostrils daily. 09/24/21  Yes [provider]  HUMALOG KWIKPEN 100 UNIT/ML KwikPen Inject 4 Units into the  skin with breakfast, with lunch, and with evening meal. Patient taking differently: Inject 4 Units into the skin with breakfast, with lunch, and with evening meal. 15 units with breakfast and lunch. 10 units before bed. 09/08/21  Yes Ezequiel Essex, MD  insulin detemir (LEVEMIR) 100 UNIT/ML FlexPen Inject 10 Units into the skin daily. Patient taking differently: Inject 20 Units into the skin daily. 09/08/21  Yes Ezequiel Essex, MD  ipratropium (ATROVENT) 0.03 % nasal spray Place 1 spray into both nostrils  daily. 06/11/21  Yes [provider]  Ipratropium-Albuterol (COMBIVENT) 20-100 MCG/ACT AERS respimat Inhale 1 puff into the lungs every 6 (six) hours as needed for wheezing. 08/09/21  Yes Allie Bossier, MD  losartan (COZAAR) 50 MG tablet Take 50 mg by mouth daily.   Yes [provider]  Melatonin 10 MG CAPS Take 10 mg by mouth at bedtime.    Yes [provider]  metFORMIN (GLUCOPHAGE) 500 MG tablet Take 500 mg by mouth daily with breakfast. 07/23/19  Yes [provider]  Multiple Vitamin (MULTIVITAMIN) tablet Take 1 tablet by mouth daily.   Yes [provider]  Niacin (VITAMIN B-3 PO) Take 1 tablet by mouth daily.   Yes [provider]  omeprazole (PRILOSEC) 20 MG capsule Take 20 mg by mouth daily. 11/28/20  Yes [provider]  predniSONE (DELTASONE) 10 MG tablet Take 1 tablet (10 mg total) by mouth daily with breakfast. 09/24/21  Yes Dewald, Cheryle Horsfall, MD  predniSONE (DELTASONE) 20 MG tablet Take 1 tablet (20 mg total) by mouth daily with breakfast. Patient taking differently: Take 40 mg by mouth daily with breakfast. 09/03/21  Yes Willaim Sheng, MD  rosuvastatin (CRESTOR) 10 MG tablet Take 10 mg by mouth daily.   Yes [provider]  sulfamethoxazole-trimethoprim (BACTRIM DS) 800-160 MG tablet Take 1 tablet by mouth every Monday, Wednesday, and Friday. 09/24/21  Yes Freddi Starr, MD  venlafaxine XR (EFFEXOR-XR) 37.5 MG  24 hr capsule Take 37.5 mg by mouth daily with breakfast.   Yes [provider]  Accu-Chek Softclix Lancets lancets Use to test blood sugars up to 4 times daily as directed. 08/09/21   Allie Bossier, MD  Blood Glucose Monitoring Suppl (BLOOD GLUCOSE MONITOR SYSTEM) w/Device KIT Use to test blood sugars up to 4 times daily. 08/09/21   Allie Bossier, MD  glucose blood (ACCU-CHEK GUIDE) test strip Use to test blood sugars up to 4 times daily as directed. 08/09/21   Allie Bossier, MD  Insulin Pen Needle 32G X 4 MM MISC Use to inject insulin up to 4 times daily as directed. 08/09/21   Allie Bossier, MD     Gaylyn Lambert ACNP Acute Care Nurse Practitioner [place] Please consult Amion 10/08/2021, 10:36 AM

## 2021-10-08 NOTE — Assessment & Plan Note (Deleted)
Stable on prednisone treatment.  - Continue to monitor - Protonix 40 mg qd - Continue home rrednisone 30 mg qd with taper plan

## 2021-10-09 DIAGNOSIS — I4891 Unspecified atrial fibrillation: Secondary | ICD-10-CM | POA: Diagnosis not present

## 2021-10-09 LAB — CBC
HCT: 32 % — ABNORMAL LOW (ref 36.0–46.0)
Hemoglobin: 10.1 g/dL — ABNORMAL LOW (ref 12.0–15.0)
MCH: 25 pg — ABNORMAL LOW (ref 26.0–34.0)
MCHC: 31.6 g/dL (ref 30.0–36.0)
MCV: 79.2 fL — ABNORMAL LOW (ref 80.0–100.0)
Platelets: 227 10*3/uL (ref 150–400)
RBC: 4.04 MIL/uL (ref 3.87–5.11)
RDW: 16.7 % — ABNORMAL HIGH (ref 11.5–15.5)
WBC: 9 10*3/uL (ref 4.0–10.5)
nRBC: 0 % (ref 0.0–0.2)

## 2021-10-09 LAB — BASIC METABOLIC PANEL
Anion gap: 12 (ref 5–15)
BUN: 16 mg/dL (ref 8–23)
CO2: 23 mmol/L (ref 22–32)
Calcium: 8.9 mg/dL (ref 8.9–10.3)
Chloride: 99 mmol/L (ref 98–111)
Creatinine, Ser: 0.94 mg/dL (ref 0.44–1.00)
GFR, Estimated: >60 mL/min (ref >60)
Glucose, Bld: 131 mg/dL — ABNORMAL HIGH (ref 70–99)
Potassium: 3.7 mmol/L (ref 3.5–5.1)
Sodium: 134 mmol/L — ABNORMAL LOW (ref 135–145)

## 2021-10-09 LAB — TSH: TSH: 0.585 u[IU]/mL (ref 0.350–4.500)

## 2021-10-09 LAB — GLUCOSE, CAPILLARY
Glucose-Capillary: 113 mg/dL — ABNORMAL HIGH (ref 70–99)
Glucose-Capillary: 150 mg/dL — ABNORMAL HIGH (ref 70–99)
Glucose-Capillary: 187 mg/dL — ABNORMAL HIGH (ref 70–99)
Glucose-Capillary: 201 mg/dL — ABNORMAL HIGH (ref 70–99)
Glucose-Capillary: 223 mg/dL — ABNORMAL HIGH (ref 70–99)
Glucose-Capillary: 333 mg/dL — ABNORMAL HIGH (ref 70–99)

## 2021-10-09 LAB — ANTI-DNA ANTIBODY, DOUBLE-STRANDED: ds DNA Ab: <1 IU/mL (ref 0–9)

## 2021-10-09 NOTE — Progress Notes (Signed)
   10/09/21 1140  Assess: MEWS Score  Temp 98.5 F (36.9 C)  BP 133/83  MAP (mmHg) 96  Pulse Rate (!) 113  ECG Heart Rate (!) 113  Resp 19  SpO2 92 %  O2 Device Room Air  Assess: MEWS Score  MEWS Temp 0  MEWS Systolic 0  MEWS Pulse 2  MEWS RR 0  MEWS LOC 0  MEWS Score 2  MEWS Score Color Yellow  Assess: if the MEWS score is Yellow or Red  Were vital signs taken at a resting state? Yes  Focused Assessment No change from prior assessment  Does the patient meet 2 or more of the SIRS criteria? No  MEWS guidelines implemented *See Row Information* Yes  Treat  Pain Scale 0-10  Pain Score 0  Take Vital Signs  Increase Vital Sign Frequency  Yellow: Q 2hr X 2 then Q 4hr X 2, if remains yellow, continue Q 4hrs  Escalate  MEWS: Escalate Yellow: discuss with charge nurse/RN and consider discussing with provider and RRT  Notify: Charge Nurse/RN  Name of Charge Nurse/RN Notified t  Date Charge Nurse/RN Notified 10/09/21  Time Charge Nurse/RN Notified 1152  Notify: Provider  Provider Name/Title Family med  Date Provider Notified 10/09/21  Time Provider Notified 1152  Method of Notification Page  Notification Reason Other (Comment) (Heart rate 120's)  Provider response No new orders  Date of Provider Response 10/09/21  Time of Provider Response 1152  Document  Patient Outcome Not stable and remains on department  Assess: SIRS CRITERIA  SIRS Temperature  0  SIRS Pulse 1  SIRS Respirations  0  SIRS WBC 0  SIRS Score Sum  1

## 2021-10-09 NOTE — Progress Notes (Addendum)
FPTS Brief Progress Note  S Saw patient at bedside this evening. Patient reports little dyspnea otherwise no concerns.   O: BP 115/75 (BP Location: Left Arm)   Pulse (!) 112   Temp 98.3 F (36.8 C) (Oral)   Resp 18   Ht 5' 2.01" (1.575 m)   Wt 74.5 kg   SpO2 95%   BMI 30.04 kg/m    General: Alert, no acute distress Cardio: tachycardic, irregular rhythm Pulm: bibasilar crackles, normal work of breathing Extremities: No peripheral edema.  Neuro: Cranial nerves grossly intact   A/P: Plan per day team   Lattie Haw, MD 10/09/2021, 10:05 PM PGY-3, [facility] Night Resident  Please page [phone] with questions.

## 2021-10-09 NOTE — Progress Notes (Signed)
PT Cancellation Note  Patient Details Name: Meghan Alexander MRN: [id number] DOB: [birth year]   Cancelled Treatment:    Reason Eval/Treat Not Completed: PT screened, no needs identified, will sign off. Pt and OT report that pt is at baseline. Mobility hampered by rt foot fx which she has been compensating for. No skilled needs at this time.   Shary Decamp [facility] 10/09/2021, 12:18 PM [place] Office [phone]

## 2021-10-09 NOTE — Progress Notes (Addendum)
Daily Progress Note Intern Pager: [phone]  Patient name: Meghan Alexander Medical record number: [id number] Date of birth: 12-20-1955 Age: 66 y.o. Gender: female  Primary Care Provider: [name], MD Consultants: Cardiology, pulmonology, ID Code Status: Full  Pt Overview and Major Events to Date:  6/16: Admitted  Assessment and Plan: Meghan Alexander is a 66 year old female admitted with new onset A-fib with RVR.   Pertinent PMH/PSH includes ILD s/p rituximab, grade 2 follicular lymphoma, GERD, achalasia, HTN, T2DM, recent right ankle fracture.   * Atrial fibrillation with RVR ([place]) Patient successfully converted to sinus rhythm yesterday on diltiazem gtt , however she has had intermittent tachycardia in the 110s worse with exertion up to 140s. Echo yesterday showed normal LV. Currently on Cardizem gtt., will monitor HR closely. - Cardiology following, appreciate recommendations - Cardizem gtt, titration goal of HR 90-105, MAP >65 - Continuous cardiac monitoring -Continue titrating IV Cardizem -Continue apixaban 5 mg BID -Stopped amlodipine  CAP (community acquired pneumonia) Patient started on antibiotics due to concern for pneumonia given leukocytosis, chest x-ray findings and fever on admission.  She has been afebrile since admission and on exam has coarse breath sounds. WBC this morning was normal and we will continue antibiotics and if no growth in 3 days will discontinue antibiotics.  Currently on prednisone taper for ILD. -  ID following , appreciate recommendations - Pulmonology following , appreciate recommendations - Maintain SpO2 >92% - Continue Ceftriaxone IV, Azithromycin IV - Albuterol prn for wheezing -Follow-up sputum culture, blood culture. - AM CBC with morning lab -Continue prednisone taper '30mg'$  daily 6/3 - 6/17 '20mg'$  daily 6/18 - 7/2 '10mg'$  daily 7/3 - 7/17   Uncontrolled type 2 diabetes mellitus with hyperglycemia (HCC) CBG in the last 24 hours have  ranged from 113-324.  This morning CBG was 113.  - CBG TID AC QHS -Continue daily Semglee 10u - SSI moderate - Carb modified diet  Achalasia Stable on prednisone treatment.  - Continue to monitor - Protonix 40 mg qd - Continue home rrednisone 30 mg qd with taper plan  Closed right ankle fracture Stable and non weight bearing for now. - WBAT - Tylenol 650 mg q6h prn  - PT/OT eval and treat   FEN/GI: Carb modified diet PPx: Eliquis Dispo:Home pending clinical improvement . Barriers include medical status.   Subjective:  Ms. Weare was laying in bed comfortably.  She says she is doing well and feeling much better. Mildly concerned about her tachycardia with exertion.  Objective: Temp:  [98.1 F (36.7 C)-98.5 F (36.9 C)] 98.5 F (36.9 C) (06/17 1140) Pulse Rate:  [89-114] 113 (06/17 1140) Resp:  [16-19] 19 (06/17 1140) BP: (112-133)/(70-83) 133/83 (06/17 1140) SpO2:  [92 %-97 %] 92 % (06/17 1140) Weight:  [74.5 kg] 74.5 kg (06/17 0500) Physical Exam: General: Alert, well appearing, NAD HEENT: Atraumatic, MMM, No sclera icterus CV: Regular rhythm, tachycardic. Pulm: CTAB, good WOB on RA, no crackles or wheezing Abd: Soft, no distension, no tenderness Skin: dry, warm Ext: No BLE edema, right foot in ortho dressing.   Laboratory: Most recent CBC Lab Results  Component Value Date   WBC 9.0 10/09/2021   HGB 10.1 (L) 10/09/2021   HCT 32.0 (L) 10/09/2021   MCV 79.2 (L) 10/09/2021   PLT 227 10/09/2021   Most recent BMP    Latest Ref Rng & Units 10/09/2021    1:57 AM  BMP  Glucose 70 - 99 mg/dL 131   BUN 8 - 23  mg/dL 16   Creatinine 0.44 - 1.00 mg/dL 0.94   Sodium 135 - 145 mmol/L 134   Potassium 3.5 - 5.1 mmol/L 3.7   Chloride 98 - 111 mmol/L 99   CO2 22 - 32 mmol/L 23   Calcium 8.9 - 10.3 mg/dL 8.9      Imaging/Diagnostic Tests: No new images  Alen Bleacher, MD 10/09/2021, 12:49 PM  PGY-1, [place] Intern pager: [phone], text  pages welcome Secure chat group [place]

## 2021-10-09 NOTE — Progress Notes (Signed)
Progress Note  Patient Name: Meghan Alexander Date of Encounter: 10/09/2021  [facility] Cardiologist: None   Subjective   She converted to sinus rhythm yesterday but has been persistently tachycardic at rest in the 110 range but with any movement goes up in the 120s.  Inpatient Medications    Scheduled Meds:  acyclovir  400 mg Oral Daily   amLODipine  10 mg Oral Daily   apixaban  5 mg Oral BID   azithromycin  500 mg Oral Daily   insulin aspart  0-15 Units Subcutaneous TID WC   insulin glargine-yfgn  10 Units Subcutaneous QHS   losartan  50 mg Oral Daily   pantoprazole  40 mg Oral Daily   rosuvastatin  10 mg Oral Daily   sulfamethoxazole-trimethoprim  1 tablet Oral Q M,W,F   venlafaxine XR  37.5 mg Oral Q breakfast   Continuous Infusions:  cefTRIAXone (ROCEPHIN)  IV 2 g (10/09/21 0516)   diltiazem (CARDIZEM) infusion 10 mg/hr (10/09/21 0517)   PRN Meds: acetaminophen **OR** acetaminophen, albuterol   Vital Signs    Vitals:   10/09/21 0100 10/09/21 0500 10/09/21 0814 10/09/21 0911  BP: 126/72 129/70 130/75 125/80  Pulse: 91 (!) 103 (!) 114 (!) 105  Resp: '18 18 18 19  '$ Temp: 98.5 F (36.9 C) 98.4 F (36.9 C) 98.3 F (36.8 C) 98.2 F (36.8 C)  TempSrc: Oral Oral Oral Oral  SpO2: 93% 95% 95% 95%  Weight:  74.5 kg    Height:        Intake/Output Summary (Last 24 hours) at 10/09/2021 0947 Last data filed at 10/09/2021 0600 Gross per 24 hour  Intake 915.89 ml  Output --  Net 915.89 ml      10/09/2021    5:00 AM 10/08/2021   11:15 AM 10/08/2021    4:16 AM  Last 3 Weights  Weight (lbs) 164 lb 4.8 oz 166 lb 14.2 oz 160 lb  Weight (kg) 74.526 kg 75.7 kg 72.576 kg      Telemetry    Atrial fibrillation with RVR- Personally Reviewed  ECG    No new EKG to review- Personally Reviewed  Physical Exam   GEN: Well nourished, well developed in no acute distress HEENT: Normal NECK: No JVD; No carotid bruits LYMPHATICS: No lymphadenopathy CARDIAC:, no murmurs,  rubs, gallops regular but tachycardic RESPIRATORY:  crackles at bases bilaterally crackles at bases bilaterally ABDOMEN: Soft, non-tender, non-distended MUSCULOSKELETAL: Edema in her toes in the right lower extremity which is wrapped from recent surgery No deformity  SKIN: Warm and dry NEUROLOGIC:  Alert and oriented x 3 PSYCHIATRIC:  Normal affect   Labs    High Sensitivity Troponin:   Recent Labs  Lab 10/08/21 0507 10/08/21 0600  TROPONINIHS 9 16      Chemistry Recent Labs  Lab 10/08/21 0507 10/09/21 0157  NA 141 134*  K 4.2 3.7  CL 100 99  CO2 26 23  GLUCOSE 113* 131*  BUN 18 16  CREATININE 0.91 0.94  CALCIUM 9.9 8.9  PROT 6.9  --   ALBUMIN 3.4*  --   AST 27  --   ALT 29  --   ALKPHOS 81  --   BILITOT 0.5  --   GFRNONAA >60 >60  ANIONGAP 15 12     Hematology Recent Labs  Lab 10/08/21 0507 10/09/21 0157  WBC 16.6* 9.0  RBC 4.88 4.04  HGB 12.0 10.1*  HCT 39.7 32.0*  MCV 81.4 79.2*  MCH 24.6* 25.0*  MCHC 30.2 31.6  RDW 16.9* 16.7*  PLT 268 227    BNPNo results for input(s): "BNP", "PROBNP" in the last 168 hours.   DDimer No results for input(s): "DDIMER" in the last 168 hours.   CHA2DS2-VASc Score = 4   This indicates a 4.8% annual risk of stroke. The patient's score is based upon: CHF History: 0 HTN History: 1 Diabetes History: 1 Stroke History: 0 Vascular Disease History: 0 Age Score: 1 Gender Score: 1    Radiology    ECHOCARDIOGRAM COMPLETE  Result Date: 10/08/2021    ECHOCARDIOGRAM REPORT   Patient Name:   [name] Date of Exam: 10/08/2021 Medical Rec #:  [id number]      Height:       62.0 in Accession #:    [id number]     Weight:       160.0 lb Date of Birth:  [birth year]       BSA:          1.739 m Patient Age:    66 years       BP:           114/85 mmHg Patient Gender: F              HR:           105 bpm. Exam Location:  Inpatient Procedure: 2D Echo, Color Doppler and Cardiac Doppler Indications:    R94.31 Abnormal EKG  History:         Patient has no prior history of Echocardiogram examinations.                 Arrythmias:Atrial Fibrillation; Risk Factors:Hypertension and                 Diabetes.  Sonographer:    Raquel Sarna Senior RDCS Referring Phys: [place]  1. Left ventricular ejection fraction, by estimation, is 55 to 60%. The left ventricle has normal function. The left ventricle has no regional wall motion abnormalities. Left ventricular diastolic parameters are indeterminate.  2. Right ventricular systolic function is normal. The right ventricular size is normal.  3. The mitral valve is abnormal. Trivial mitral valve regurgitation. No evidence of mitral stenosis.  4. The aortic valve is tricuspid. Aortic valve regurgitation is not visualized. No aortic stenosis is present.  5. The inferior vena cava is dilated in size with >50% respiratory variability, suggesting right atrial pressure of 8 mmHg. FINDINGS  Left Ventricle: Left ventricular ejection fraction, by estimation, is 55 to 60%. The left ventricle has normal function. The left ventricle has no regional wall motion abnormalities. The left ventricular internal cavity size was normal in size. There is  no left ventricular hypertrophy. Left ventricular diastolic parameters are indeterminate. Right Ventricle: The right ventricular size is normal. No increase in right ventricular wall thickness. Right ventricular systolic function is normal. Left Atrium: Left atrial size was normal in size. Right Atrium: Right atrial size was normal in size. Pericardium: There is no evidence of pericardial effusion. Mitral Valve: The mitral valve is abnormal. There is mild thickening of the mitral valve leaflet(s). There is mild calcification of the mitral valve leaflet(s). Trivial mitral valve regurgitation. No evidence of mitral valve stenosis. Tricuspid Valve: The tricuspid valve is normal in structure. Tricuspid valve regurgitation is trivial. No evidence of tricuspid stenosis.  Aortic Valve: The aortic valve is tricuspid. Aortic valve regurgitation is not visualized. No aortic stenosis is present. Pulmonic Valve: The pulmonic valve was normal in structure.  Pulmonic valve regurgitation is not visualized. No evidence of pulmonic stenosis. Aorta: The aortic root is normal in size and structure. Venous: The inferior vena cava is dilated in size with greater than 50% respiratory variability, suggesting right atrial pressure of 8 mmHg. IAS/Shunts: No atrial level shunt detected by color flow Doppler.  LEFT VENTRICLE PLAX 2D LVIDd:         3.50 cm LVIDs:         2.70 cm LV PW:         1.50 cm LV IVS:        1.10 cm LVOT diam:     2.00 cm LV SV:         34 LV SV Index:   19 LVOT Area:     3.14 cm  RIGHT VENTRICLE RV S prime:     9.61 cm/s TAPSE (M-mode): 1.2 cm LEFT ATRIUM           Index LA diam:      3.60 cm 2.07 cm/m LA Vol (A2C): 40.7 ml 23.41 ml/m LA Vol (A4C): 46.8 ml 26.92 ml/m  AORTIC VALVE LVOT Vmax:   69.47 cm/s LVOT Vmean:  50.067 cm/s LVOT VTI:    0.107 m  AORTA Ao Root diam: 3.00 cm Ao Asc diam:  3.00 cm  SHUNTS Systemic VTI:  0.11 m Systemic Diam: 2.00 cm Jenkins Rouge MD Electronically signed by Jenkins Rouge MD Signature Date/Time: 10/08/2021/11:27:49 AM    Final    CT ANGIO CHEST PE W OR WO CONTRAST  Result Date: 10/08/2021 CLINICAL DATA:  Irregular heartbeat.  Pulmonary embolism suspected. EXAM: CT ANGIOGRAPHY CHEST WITH CONTRAST TECHNIQUE: Multidetector CT imaging of the chest was performed using the standard protocol during bolus administration of intravenous contrast. Multiplanar CT image reconstructions and MIPs were obtained to evaluate the vascular anatomy. RADIATION DOSE REDUCTION: This exam was performed according to the departmental dose-optimization program which includes automated exposure control, adjustment of the mA and/or kV according to patient size and/or use of iterative reconstruction technique. CONTRAST:  49m OMNIPAQUE IOHEXOL 350 MG/ML SOLN COMPARISON:   Radiographs 10/08/2021.  CTA 09/06/2021. FINDINGS: Cardiovascular: The pulmonary arteries are well opacified with contrast to the level of the subsegmental branches. There is no evidence of acute pulmonary embolism. There is limited opacification of the systemic vessels which demonstrate mild atherosclerosis, but no acute abnormality. The heart size is normal. There is no pericardial effusion. Mediastinum/Nodes: There are no enlarged mediastinal, hilar or axillary lymph nodes.Diffuse esophageal dilatation again noted with associated air-fluid levels. The thyroid gland and trachea appear unremarkable. Lungs/Pleura: No pleural effusion or pneumothorax. Again demonstrated are patchy airspace and ground-glass opacities throughout both lungs. Compared with the previous CT, overall improvement is demonstrated. However, these opacities had partially cleared on radiographs 09/24/2021 and have progressed since then. There is some associated architectural distortion. No confluent airspace opacity or suspicious pulmonary nodule. Upper abdomen:  Calcified splenic granulomas.  No acute findings. Musculoskeletal/Chest wall: There is no chest wall mass or suspicious osseous finding. Bilateral breast implants. Mild degenerative changes in the spine. Review of the MIP images confirms the above findings. IMPRESSION: 1. No evidence of acute pulmonary embolism or other acute vascular findings in the chest. 2. Recurrent/fluctuating bilateral ground-glass and airspace opacities, overall improved compared with prior CT of 1 month ago, but worsened from radiographs of 2 weeks ago. Differential considerations include atypical infection, non infectious inflammation, drug reaction and atypical edema. Given the fluctuation, lymphomatous involvement less likely. Radiographic follow up recommended.  3. No adenopathy or pleural effusion. 4. Dilated esophagus with air-fluid levels consistent with achalasia. Electronically Signed   By: Richardean Sale M.D.   On: 10/08/2021 07:59   DG Chest Portable 1 View  Result Date: 10/08/2021 CLINICAL DATA:  66 year old female with chest pain. History of lymphoma. EXAM: PORTABLE CHEST 1 VIEW COMPARISON:  Chest radiograph 09/24/2021 and earlier. FINDINGS: Widespread bilateral Patchy and confluent abnormal lung opacity by CTA in May appear significantly improved on radiographs earlier this month. However, on this Portable AP semi upright view at 0504 hours there is recurrent confluent bilateral perihilar opacity, including in the right upper lobe layering along the minor fissure. Mildly lower lung volumes now. Mediastinal contours are stable. Dilated thoracic esophagus again evident. Visualized tracheal air column is within normal limits. No pneumothorax or pulmonary edema. No air bronchograms now, streaky lung base opacity persists since last month. Stable visible upper abdomen with small epigastric clips. No acute osseous abnormality identified. IMPRESSION: 1. Recurrent bilateral perihilar lung opacity since June 2nd, and probable unresolved bilateral lower lung opacity seen by CTA last month. This is indeterminate for recurrent infectious versus non infectious inflammatory, and less likely neoplastic (Lymphoma) etiology. Aspiration (#2) also a consideration. No pulmonary edema or significant pleural effusion. 2. Dilated esophagus as seen on CTA last month. Electronically Signed   By: Genevie Ann M.D.   On: 10/08/2021 05:32    Cardiac Studies   2D echo 10/08/2021 IMPRESSIONS    1. Left ventricular ejection fraction, by estimation, is 55 to 60%. The  left ventricle has normal function. The left ventricle has no regional  wall motion abnormalities. Left ventricular diastolic parameters are  indeterminate.   2. Right ventricular systolic function is normal. The right ventricular  size is normal.   3. The mitral valve is abnormal. Trivial mitral valve regurgitation. No  evidence of mitral stenosis.   4. The  aortic valve is tricuspid. Aortic valve regurgitation is not  visualized. No aortic stenosis is present.   5. The inferior vena cava is dilated in size with >50% respiratory  variability, suggesting right atrial pressure of 8 mmHg.   Patient Profile     66 y.o. 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 Erin Hearing.  Assessment & Plan    Paroxysmal atrial fibrillation with RVR -Likely related to pulmonary issue.   -She converted to sinus rhythm yesterday but has remained tachycardic with a heart rate in the 110 range. -Unclear why she is remaining tachycardic despite IV Cardizem drip at 10 mg/h -Chest CTA yesterday showed no evidence of PE but did show groundglass opacities although overall improved from a month ago but worsened from chest x-ray 2 weeks ago and felt to likely represent pneumonia -Started on Eliquis given CHA2DS2-VASc score of 4 -Echo this admission showed normal LV function -TSH normal at 0.585 -Hesitant to use beta-blocker given significant pulmonary issue.   -Continue Eliquis 5 mg twice daily  -Stop amlodipine continue to titrate IV Cardizem drip for heart rate control -Hopefully heart rate will improve with treatment of pneumonia   Community-acquired pneumonia -Per [facility]   Hypertension -BP controlled at 125/80 mmHg this morning  -stop amlodipine since she is now on IV Cardizem drip and will convert IV Cardizem to p.o. prior to discharge -Continue losartan 50 mg daily  Hyperlipidemia: -On Crestor 10 mg daily.  - Previous LDL 113 on 08/08/2021   History of ILD pneumonitis:  -Followed by PCCM as outpatient.      For questions or updates, please contact [place] Please consult [email or web address] for contact info under        Signed, Fransico Him, MD   10/09/2021, 9:47 AM

## 2021-10-09 NOTE — Progress Notes (Signed)
  S:Went to bedside to check on patient. She awakened as I entered the room. Reports that she had a headache earlier today but this has resolved. Denies chest pain, dyspnea or other concerns.   O: BP 123/81 (BP Location: Right Arm)   Pulse 90   Temp 98.2 F (36.8 C) (Oral)   Resp 18   Ht 5' 2.01" (1.575 m)   Wt 75.7 kg   SpO2 97%   BMI 30.52 kg/m   General: Patient laying comfortably in bed, in no acute distress. CV: irregularly irregular rhythm, regular rate, no murmurs or gallops auscultated Resp: CTAB, no wheezing, rales or rhonchi noted Psych: mood appropriate, very pleasant   A/P: Patient presents with CAP and also incidentally noted to develop new onset atrial fibrillation. Cardiology consulted and following, on diltiazem infusion and may transition to oral tomorrow. Vitals stable and telemetry appropriate. Orders reviewed. Continue to appreciate cardiology recommendations. Remainder of plan per day team.   Donney Dice, DO 10/09/2021, 12:04 AM PGY-2, [place] [facility] pager [phone]

## 2021-10-10 DIAGNOSIS — R000 Tachycardia, unspecified: Secondary | ICD-10-CM

## 2021-10-10 DIAGNOSIS — J189 Pneumonia, unspecified organism: Secondary | ICD-10-CM | POA: Diagnosis not present

## 2021-10-10 DIAGNOSIS — J849 Interstitial pulmonary disease, unspecified: Secondary | ICD-10-CM

## 2021-10-10 DIAGNOSIS — I4891 Unspecified atrial fibrillation: Secondary | ICD-10-CM | POA: Diagnosis not present

## 2021-10-10 LAB — BASIC METABOLIC PANEL
Anion gap: 13 (ref 5–15)
BUN: 11 mg/dL (ref 8–23)
CO2: 26 mmol/L (ref 22–32)
Calcium: 9.2 mg/dL (ref 8.9–10.3)
Chloride: 100 mmol/L (ref 98–111)
Creatinine, Ser: 0.84 mg/dL (ref 0.44–1.00)
GFR, Estimated: >60 mL/min (ref >60)
Glucose, Bld: 141 mg/dL — ABNORMAL HIGH (ref 70–99)
Potassium: 3.9 mmol/L (ref 3.5–5.1)
Sodium: 139 mmol/L (ref 135–145)

## 2021-10-10 LAB — CBC
HCT: 31.6 % — ABNORMAL LOW (ref 36.0–46.0)
Hemoglobin: 10.3 g/dL — ABNORMAL LOW (ref 12.0–15.0)
MCH: 25.8 pg — ABNORMAL LOW (ref 26.0–34.0)
MCHC: 32.6 g/dL (ref 30.0–36.0)
MCV: 79 fL — ABNORMAL LOW (ref 80.0–100.0)
Platelets: 231 10*3/uL (ref 150–400)
RBC: 4 MIL/uL (ref 3.87–5.11)
RDW: 16.5 % — ABNORMAL HIGH (ref 11.5–15.5)
WBC: 8.6 10*3/uL (ref 4.0–10.5)
nRBC: 0 % (ref 0.0–0.2)

## 2021-10-10 LAB — GLUCOSE, CAPILLARY
Glucose-Capillary: 125 mg/dL — ABNORMAL HIGH (ref 70–99)
Glucose-Capillary: 280 mg/dL — ABNORMAL HIGH (ref 70–99)
Glucose-Capillary: 301 mg/dL — ABNORMAL HIGH (ref 70–99)

## 2021-10-10 MED ORDER — INSULIN ASPART 100 UNIT/ML IJ SOLN
5.0000 [IU] | Freq: Three times a day (TID) | INTRAMUSCULAR | Status: DC
  Administered 2021-10-10 – 2021-10-11 (×3): 5 [IU] via SUBCUTANEOUS

## 2021-10-10 MED ORDER — LIP MEDEX EX OINT
TOPICAL_OINTMENT | CUTANEOUS | Status: DC | PRN
  Filled 2021-10-10: qty 7

## 2021-10-10 MED ORDER — PREDNISONE 20 MG PO TABS
20.0000 mg | ORAL_TABLET | Freq: Every day | ORAL | Status: DC
  Administered 2021-10-10 – 2021-10-11 (×2): 20 mg via ORAL
  Filled 2021-10-10 (×2): qty 1

## 2021-10-10 MED ORDER — PREDNISONE 20 MG PO TABS: 20.0000 mg | ORAL_TABLET | Freq: Every day | ORAL | Status: DC

## 2021-10-10 MED ORDER — DILTIAZEM HCL 60 MG PO TABS
60.0000 mg | ORAL_TABLET | Freq: Four times a day (QID) | ORAL | Status: DC
  Administered 2021-10-10 – 2021-10-11 (×4): 60 mg via ORAL
  Filled 2021-10-10 (×4): qty 1

## 2021-10-10 MED ORDER — METOPROLOL SUCCINATE ER 25 MG PO TB24
25.0000 mg | ORAL_TABLET | Freq: Every day | ORAL | Status: DC
Start: 2021-10-10 — End: 2021-10-11
  Administered 2021-10-10 – 2021-10-11 (×2): 25 mg via ORAL
  Filled 2021-10-10 (×2): qty 1

## 2021-10-10 MED ORDER — INSULIN ASPART 100 UNIT/ML IJ SOLN
0.0000 [IU] | Freq: Three times a day (TID) | INTRAMUSCULAR | Status: DC
  Administered 2021-10-10: 5 [IU] via SUBCUTANEOUS
  Administered 2021-10-11 (×2): 2 [IU] via SUBCUTANEOUS

## 2021-10-10 NOTE — Hospital Course (Signed)
Meghan Alexander is a 66 y.o.female with a history of *** who was admitted to the *** Teaching Service at [facility] for ***. {Blank single:19197::"His","Her"} hospital course is detailed below:   Other chronic conditions were medically managed with home medications and formulary alternatives as necessary (***)  PCP Follow-up Recommendations:

## 2021-10-10 NOTE — Assessment & Plan Note (Signed)
Not currently in flare per pulm based on CT imaging on admission. -Continue prednisone taper as below '20mg'$  daily 6/18 - 7/2 '10mg'$  daily 7/3 - 7/17  -Continue Bactrim ppx -Outpatient pulm f/u

## 2021-10-10 NOTE — Progress Notes (Signed)
     Daily Progress Note Intern Pager: [phone]  Patient name: Meghan Alexander Medical record number: [id number] Date of birth: 1955/05/14 Age: 66 y.o. Gender: female  Primary Care Provider: [name], MD Consultants: Cardiology, pulmonology (s/o), ID (s/o) Code Status: Full  Pt Overview and Major Events to Date:  6/16: Admitted; converted to NSR  Assessment and Plan:  Meghan Alexander is a 66 year old female admitted with new onset A-fib with RVR in the setting of CAP. Pertinent PMH/PSH includes ILD s/p rituximab, grade 2 follicular lymphoma, GERD, achalasia, T2DM, HTN and recent R ankle fracture.   * Atrial fibrillation with RVR (HCC) Converted to NSR on 6/16. -Cardiology following, appreciate recommendations -Remains on Cardizem gtt, titration goal of HR 90-105, MAP >65 -Continue apixaban 5 mg BID -Cardiac monitoring  Sinus tachycardia Remains in sinus tach with rates 110-120s at rest. She was taking Metoprolol '50mg'$  daily prior to admission. -Cardiology following, appreciate recommendations -Continue diltiazem gtt for now -Favor resuming home metoprolol but will defer to cardiology  CAP (community acquired pneumonia) Remains afebrile without leukocytosis. -Continue ceftriaxone and azithromycin -Follow-up blood culture results (NGTD x2 days) -Home Albuterol prn for wheezing   Uncontrolled type 2 diabetes mellitus with hyperglycemia (HCC) Glucose range 113-333 over past 24h. Required 16 units sliding scale coverage yesterday. Initially came in with hypoglycemia after home mealtime coverage was increased. -CBG monitoring qAC and qHS -Continue Semglee 10u daily -5u Aspart with meals -Sensitive SSI -Carb modified diet  ILD (interstitial lung disease) ([place]) Not currently in flare per pulm based on CT imaging on admission. -Continue prednisone taper as below '20mg'$  daily 6/18 - 7/2 '10mg'$  daily 7/3 - 7/17  -Continue Bactrim ppx -Outpatient pulm f/u   FEN/GI: Carb  modified diet PPx: Eliquis Dispo:Home pending clinical improvement . Barriers include sinus tachycardia on dilt gtt.   Subjective:  Patient reports ongoing heart racing sensation and feels like she can't catch her breath. Otherwise has no complaints.  Objective: Temp:  [98.2 F (36.8 C)-98.5 F (36.9 C)] 98.4 F (36.9 C) (06/18 0016) Pulse Rate:  [102-120] 120 (06/18 0825) Resp:  [18-19] 18 (06/18 0825) BP: (115-133)/(66-83) 132/73 (06/18 0825) SpO2:  [92 %-97 %] 94 % (06/18 0825) Weight:  [74.8 kg] 74.8 kg (06/18 0016) Physical Exam: General: alert, well-appearing, NAD Cardiovascular: tachycardic, regular rhythm, normal S1/S2 without m/r/g Respiratory: normal effort, crackles in bilateral lower and mid lung fields Abdomen: soft, nontender, nondistended Extremities: no peripheral edema. ACE in place on R ankle  Laboratory: Most recent CBC Lab Results  Component Value Date   WBC 8.6 10/10/2021   HGB 10.3 (L) 10/10/2021   HCT 31.6 (L) 10/10/2021   MCV 79.0 (L) 10/10/2021   PLT 231 10/10/2021   Most recent BMP    Latest Ref Rng & Units 10/10/2021    3:38 AM  BMP  Glucose 70 - 99 mg/dL 141   BUN 8 - 23 mg/dL 11   Creatinine 0.44 - 1.00 mg/dL 0.84   Sodium 135 - 145 mmol/L 139   Potassium 3.5 - 5.1 mmol/L 3.9   Chloride 98 - 111 mmol/L 100   CO2 22 - 32 mmol/L 26   Calcium 8.9 - 10.3 mg/dL 9.2     Other pertinent labs: None  Imaging/Diagnostic Tests: No new imaging/diagnostic tests over past 24h.  Alcus Dad, MD 10/10/2021, 10:39 AM  PGY-2, [place] Intern pager: [phone], text pages welcome Secure chat group [place]

## 2021-10-10 NOTE — Progress Notes (Addendum)
Progress Note  Patient Name: Meghan Alexander Date of Encounter: 10/10/2021  [facility] Cardiologist: None   Subjective   Remains sinus tachycardia after converting from A-fib.  She remains tachycardic.  TSH normal and chest CTA showed no PE.  Hemoglobin 10.3.   Found out today that she had been on beta-blockers at home but did not tell the admitting physician so she is likely having beta-blocker withdrawal as the reason for her tachycardia Inpatient Medications    Scheduled Meds:  acyclovir  400 mg Oral Daily   apixaban  5 mg Oral BID   insulin aspart  0-9 Units Subcutaneous TID WC   insulin aspart  5 Units Subcutaneous TID WC   insulin glargine-yfgn  10 Units Subcutaneous QHS   losartan  50 mg Oral Daily   pantoprazole  40 mg Oral Daily   [START ON 10/11/2021] predniSONE  20 mg Oral Q breakfast   rosuvastatin  10 mg Oral Daily   sulfamethoxazole-trimethoprim  1 tablet Oral Q M,W,F   venlafaxine XR  37.5 mg Oral Q breakfast   Continuous Infusions:  cefTRIAXone (ROCEPHIN)  IV 2 g (10/10/21 0619)   diltiazem (CARDIZEM) infusion 10 mg/hr (10/10/21 0616)   PRN Meds: acetaminophen **OR** acetaminophen, albuterol   Vital Signs    Vitals:   10/09/21 1344 10/09/21 1634 10/10/21 0016 10/10/21 0825  BP: 126/66 115/75 133/74 132/73  Pulse: (!) 115 (!) 112 (!) 102 (!) 120  Resp: '19 18 18 18  '$ Temp: 98.2 F (36.8 C) 98.3 F (36.8 C) 98.4 F (36.9 C)   TempSrc: Oral Oral Oral   SpO2: 93% 95% 97% 94%  Weight:   74.8 kg   Height:        Intake/Output Summary (Last 24 hours) at 10/10/2021 1033 Last data filed at 10/10/2021 0825 Gross per 24 hour  Intake 568.29 ml  Output --  Net 568.29 ml       10/10/2021   12:16 AM 10/09/2021    5:00 AM 10/08/2021   11:15 AM  Last 3 Weights  Weight (lbs) 165 lb 164 lb 4.8 oz 166 lb 14.2 oz  Weight (kg) 74.844 kg 74.526 kg 75.7 kg      Telemetry    Sinus tachycardia in the 100's but increases to 130's with movement- Personally  Reviewed  ECG    No new EKG to review- Personally Reviewed  Physical Exam   GEN: Well nourished, well developed in no acute distress HEENT: Normal NECK: No JVD; No carotid bruits LYMPHATICS: No lymphadenopathy CARDIAC: Regular but tachycardic, no murmurs, rubs, gallops RESPIRATORY:  crackles at bases bilaterally ABDOMEN: Soft, non-tender, non-distended MUSCULOSKELETAL:  No edema; right leg is wrapped from recent surgery SKIN: Warm and dry NEUROLOGIC:  Alert and oriented x 3 PSYCHIATRIC:  Normal affect   Labs    High Sensitivity Troponin:   Recent Labs  Lab 10/08/21 0507 10/08/21 0600  TROPONINIHS 9 16       Chemistry Recent Labs  Lab 10/08/21 0507 10/09/21 0157 10/10/21 0338  NA 141 134* 139  K 4.2 3.7 3.9  CL 100 99 100  CO2 '26 23 26  '$ GLUCOSE 113* 131* 141*  BUN '18 16 11  '$ CREATININE 0.91 0.94 0.84  CALCIUM 9.9 8.9 9.2  PROT 6.9  --   --   ALBUMIN 3.4*  --   --   AST 27  --   --   ALT 29  --   --   ALKPHOS 81  --   --  BILITOT 0.5  --   --   GFRNONAA >60 >60 >60  ANIONGAP '15 12 13      '$ Hematology Recent Labs  Lab 10/08/21 0507 10/09/21 0157 10/10/21 0338  WBC 16.6* 9.0 8.6  RBC 4.88 4.04 4.00  HGB 12.0 10.1* 10.3*  HCT 39.7 32.0* 31.6*  MCV 81.4 79.2* 79.0*  MCH 24.6* 25.0* 25.8*  MCHC 30.2 31.6 32.6  RDW 16.9* 16.7* 16.5*  PLT 268 227 231     BNPNo results for input(s): "BNP", "PROBNP" in the last 168 hours.   DDimer No results for input(s): "DDIMER" in the last 168 hours.   CHA2DS2-VASc Score = 4   This indicates a 4.8% annual risk of stroke. The patient's score is based upon: CHF History: 0 HTN History: 1 Diabetes History: 1 Stroke History: 0 Vascular Disease History: 0 Age Score: 1 Gender Score: 1    Radiology    No results found.  Cardiac Studies   2D echo 10/08/2021 IMPRESSIONS    1. Left ventricular ejection fraction, by estimation, is 55 to 60%. The  left ventricle has normal function. The left ventricle has  no regional  wall motion abnormalities. Left ventricular diastolic parameters are  indeterminate.   2. Right ventricular systolic function is normal. The right ventricular  size is normal.   3. The mitral valve is abnormal. Trivial mitral valve regurgitation. No  evidence of mitral stenosis.   4. The aortic valve is tricuspid. Aortic valve regurgitation is not  visualized. No aortic stenosis is present.   5. The inferior vena cava is dilated in size with >50% respiratory  variability, suggesting right atrial pressure of 8 mmHg.   Patient Profile     66 y.o. 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 Erin Hearing.  Assessment & Plan    Paroxysmal atrial fibrillation with RVR -Likely related to pulmonary issue.   -She converted to sinus rhythm but has remained tachycardic with a heart rate in the 110 range. -She was apparently on beta-blockers at home but did not tell the admitting physician and have was not ordered here and is the likely reason she is now having rebound tachycardia from missing her beta-blocker -Chest CTA  showed no evidence of PE but did show groundglass opacities although overall improved from a month ago but worsened from chest x-ray 2 weeks ago and felt to likely represent pneumonia -TSH is normal -Started on Eliquis given CHA2DS2-VASc score of 4 -Echo this admission showed normal LV function -Apparently she was taking Lopressor 50 mg twice daily but recently changed down to 50 mg daily>> will start on Toprol-XL 25 mg daily and titrate as needed for heart rate control.  This is a beta-1 selective beta-blocker and will be better to use with her underlying lung disease -Continue Eliquis 5 mg twice daily and -Amlodipine was stopped and she is on IV Cardizem drip at '10mg'$ /hr>>will  change to Cardizem PO '60mg'$  q6 hours and consolidate to CD at discharge -Hopefully heart rate will improve with treatment of pneumonia and restarting BB   Community-acquired pneumonia -Per TR[facility]   Hypertension -BP controlled at 132/73 mmHg this morning -Continue on IV Cardizem drip and will convert IV Cardizem to p.o. prior to discharge -Continue losartan 50 mg daily    Hyperlipidemia: -On Crestor 10 mg daily.  -  Previous LDL 113 on 08/08/2021   History of ILD pneumonitis:  -Followed by PCCM as outpatient.      For questions or updates, please contact [place] Please consult [email or web address] for contact info under        Signed, Fransico Him, MD  10/10/2021, 10:33 AM

## 2021-10-10 NOTE — Assessment & Plan Note (Addendum)
HR this morning is stable 88-92. She was taking Metoprolol 50mg  daily prior to admission. -Cardiology following, appreciate recommendations -Started p.o. Cardizem 60 mg every 6 hours -Started metoprolol XL 25 mg  Daily with plan to titrate as needed

## 2021-10-10 NOTE — Plan of Care (Signed)
  Problem: Activity: Goal: Ability to tolerate increased activity will improve Outcome: Progressing   Problem: Activity: Goal: Ability to tolerate increased activity will improve Outcome: Progressing   Problem: Activity: Goal: Ability to tolerate increased activity will improve Outcome: Progressing   Problem: Activity: Goal: Risk for activity intolerance will decrease Outcome: Progressing

## 2021-10-11 ENCOUNTER — Other Ambulatory Visit (HOSPITAL_COMMUNITY): Payer: Self-pay

## 2021-10-11 ENCOUNTER — Encounter: Payer: Self-pay | Admitting: Hematology

## 2021-10-11 DIAGNOSIS — I4891 Unspecified atrial fibrillation: Secondary | ICD-10-CM | POA: Diagnosis not present

## 2021-10-11 LAB — GLUCOSE, CAPILLARY
Glucose-Capillary: 174 mg/dL — ABNORMAL HIGH (ref 70–99)
Glucose-Capillary: 184 mg/dL — ABNORMAL HIGH (ref 70–99)

## 2021-10-11 LAB — CBC
HCT: 32.9 % — ABNORMAL LOW (ref 36.0–46.0)
Hemoglobin: 10.5 g/dL — ABNORMAL LOW (ref 12.0–15.0)
MCH: 25.1 pg — ABNORMAL LOW (ref 26.0–34.0)
MCHC: 31.9 g/dL (ref 30.0–36.0)
MCV: 78.5 fL — ABNORMAL LOW (ref 80.0–100.0)
Platelets: 242 10*3/uL (ref 150–400)
RBC: 4.19 MIL/uL (ref 3.87–5.11)
RDW: 16.5 % — ABNORMAL HIGH (ref 11.5–15.5)
WBC: 7.3 10*3/uL (ref 4.0–10.5)
nRBC: 0 % (ref 0.0–0.2)

## 2021-10-11 LAB — BASIC METABOLIC PANEL
Anion gap: 13 (ref 5–15)
BUN: 15 mg/dL (ref 8–23)
CO2: 25 mmol/L (ref 22–32)
Calcium: 9.2 mg/dL (ref 8.9–10.3)
Chloride: 100 mmol/L (ref 98–111)
Creatinine, Ser: 0.88 mg/dL (ref 0.44–1.00)
GFR, Estimated: >60 mL/min (ref >60)
Glucose, Bld: 207 mg/dL — ABNORMAL HIGH (ref 70–99)
Potassium: 3.9 mmol/L (ref 3.5–5.1)
Sodium: 138 mmol/L (ref 135–145)

## 2021-10-11 MED ORDER — DILTIAZEM HCL ER COATED BEADS 240 MG PO CP24
240.0000 mg | ORAL_CAPSULE | Freq: Every day | ORAL | 0 refills | Status: DC
  Filled 2021-10-11: qty 30, 30d supply, fill #0

## 2021-10-11 MED ORDER — ACETAMINOPHEN 325 MG PO TABS
650.0000 mg | ORAL_TABLET | Freq: Three times a day (TID) | ORAL | 0 refills | Status: AC | PRN
Start: 2021-10-11 — End: 2021-11-10

## 2021-10-11 MED ORDER — SULFAMETHOXAZOLE-TRIMETHOPRIM 800-160 MG PO TABS: 1.0000 | ORAL_TABLET | ORAL | Status: AC

## 2021-10-11 MED ORDER — DILTIAZEM HCL ER COATED BEADS 240 MG PO CP24
240.0000 mg | ORAL_CAPSULE | Freq: Every day | ORAL | Status: DC
  Administered 2021-10-11: 240 mg via ORAL
  Filled 2021-10-11: qty 1

## 2021-10-11 MED ORDER — HUMALOG KWIKPEN 100 UNIT/ML ~~LOC~~ SOPN: 5.0000 [IU] | PEN_INJECTOR | Freq: Three times a day (TID) | SUBCUTANEOUS | 11 refills | Status: AC

## 2021-10-11 MED ORDER — LIVING WELL WITH DIABETES BOOK
Freq: Once | Status: AC
Start: 2021-10-11 — End: 2021-10-11
  Filled 2021-10-11: qty 1

## 2021-10-11 MED ORDER — INSULIN DETEMIR 100 UNIT/ML FLEXPEN: 10.0000 [IU] | PEN_INJECTOR | Freq: Every day | SUBCUTANEOUS | 0 refills | Status: DC

## 2021-10-11 MED ORDER — PREDNISONE 10 MG PO TABS: 10.0000 mg | ORAL_TABLET | Freq: Every day | ORAL | 2 refills | Status: DC

## 2021-10-11 MED ORDER — METOPROLOL SUCCINATE ER 25 MG PO TB24
25.0000 mg | ORAL_TABLET | Freq: Every day | ORAL | 0 refills | Status: DC
  Filled 2021-10-11: qty 30, 30d supply, fill #0

## 2021-10-11 MED ORDER — APIXABAN 5 MG PO TABS
5.0000 mg | ORAL_TABLET | Freq: Two times a day (BID) | ORAL | 0 refills | Status: DC
  Filled 2021-10-11: qty 60, 30d supply, fill #0

## 2021-10-11 MED ORDER — PREDNISONE 20 MG PO TABS: 20.0000 mg | ORAL_TABLET | Freq: Every day | ORAL | Status: AC

## 2021-10-11 NOTE — TOC Initial Note (Signed)
Transition of Care [facility]) - Initial/Assessment Note    Patient Details  Name: Meghan Alexander MRN: [id number] Date of Birth: Mar 16, 1956  Transition of Care [facility]) CM/SW Contact:    Zenon Mayo, RN Phone Number: 10/11/2021, 1:23 PM  Clinical Narrative:                 From home with spouse, pretty indep, she has a R ankle fx, which she is seeing her orthopedic MD for that and she has w/chair and crutches and home oxygen at home, she also has a walking boot which she is using now at home.  Will start on eliquis , co pay is 47.00.  Patient states her spouse will transport her home today. She only wears oxygen on exertion she states.    Expected Discharge Plan: Home/Self Care Barriers to Discharge: No Barriers Identified   Patient Goals and CMS Choice Patient states their goals for this hospitalization and ongoing recovery are:: return home with spouse   Choice offered to / list presented to : NA  Expected Discharge Plan and Services Expected Discharge Plan: Home/Self Care In-house Referral: NA Discharge Planning Services: CM Consult Post Acute Care Choice: NA Living arrangements for the past 2 months: Single Family Home Expected Discharge Date: 10/11/21                 DME Agency: NA       HH Arranged: NA          Prior Living Arrangements/Services Living arrangements for the past 2 months: Single Family Home Lives with:: Spouse Patient language and need for interpreter reviewed:: Yes Do you feel safe going back to the place where you live?: Yes      Need for Family Participation in Patient Care: Yes (Comment) Care giver support system in place?: Yes (comment) Current home services: DME (crutches, w/chair,home oxygen- adapt 2 liters prn) Criminal Activity/Legal Involvement Pertinent to Current Situation/Hospitalization: No - Comment as needed  Activities of Daily Living Home Assistive Devices/Equipment: Walker (specify type) (walker) ADL Screening (condition  at time of admission) Patient's cognitive ability adequate to safely complete daily activities?: Yes Is the patient deaf or have difficulty hearing?: No Does the patient have difficulty seeing, even when wearing glasses/contacts?: No Does the patient have difficulty concentrating, remembering, or making decisions?: No Patient able to express need for assistance with ADLs?: Yes Does the patient have difficulty dressing or bathing?: Yes Independently performs ADLs?: Yes (appropriate for developmental age) Does the patient have difficulty walking or climbing stairs?: Yes Weakness of Legs: None Weakness of Arms/Hands: None  Permission Sought/Granted                  Emotional Assessment Appearance:: Appears stated age Attitude/Demeanor/Rapport: Engaged Affect (typically observed): Appropriate Orientation: : Oriented to Situation, Oriented to Place, Oriented to  Time, Oriented to Self Alcohol / Substance Use: Not Applicable    Admission diagnosis:  Hypoglycemia [E16.2] Atrial fibrillation with RVR ([place]) [I48.91] Community acquired pneumonia, unspecified laterality [J18.9] Patient Active Problem List   Diagnosis Date Noted   ILD (interstitial lung disease) ([place]) 10/10/2021   Atrial fibrillation with RVR ([place]) 10/08/2021   UTI (urinary tract infection) 09/07/2021   Hypoxia 09/06/2021   Closed right ankle fracture 09/03/2021   Pneumonitis 08/26/2021   Exertional dyspnea 08/07/2021   Sinus tachycardia 08/07/2021   Essential hypertension 08/07/2021   Dyspnea on exertion 08/03/2021   Uncontrolled type 2 diabetes mellitus with hyperglycemia ([place]) 08/03/2021   Dyspnea 08/02/2021  Acute respiratory failure with hypoxia ([place]) 08/02/2021   HTN (hypertension) 08/02/2021   Type 2 diabetes mellitus ([place]) 08/02/2021   Hypokalemia 08/02/2021   Achalasia 08/02/2021   Elevated serum free T4 level 30/10/6224   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 [place] [address] Phone: [phone] Fax: [phone]     Social Determinants of Health (SDOH) Interventions    Readmission Risk Interventions    10/11/2021    1:21 PM  Readmission Risk Prevention Plan  PCP or Specialist Appt within 3-5 Days Complete  HRI or Home Care Consult Complete  Medication Review (RN Care Manager) Complete

## 2021-10-11 NOTE — Care Management Important Message (Signed)
Important Message  Patient Details  Name: Meghan Alexander MRN: [id number] Date of Birth: 1955-07-23   Medicare Important Message Given:        [name] 10/11/2021, 1:52 PM

## 2021-10-11 NOTE — Discharge Instructions (Addendum)
Dear Meghan Alexander,   Thank you for letting us participate in your care! In this section, you will find a brief summary of why you were admitted to the hospital, what happened during your admission, your diagnosis/diagnoses, and recommended follow up.  You were admitted because you were experiencing low blood sugar and fast, irregular heart rate.  You were diagnosed with atrial fibrillation. You were treated with medications to help control your heart rate. You were also treated with antibiotics for a possible pneumonia.  You were seen by the cardiologists and pulmonologists. Your symptoms improved and you were discharged from the hospital for meeting this goal.    POST-HOSPITAL & CARE INSTRUCTIONS Please see medications section of this packet for any medication changes. There are several. Please let PCP/Specialists know of any changes in medications that were made.   DOCTOR'S APPOINTMENTS & FOLLOW UP Future Appointments  Date Time Provider [place]  11/05/2021 10:30 AM Freddi Starr, MD [facility] None  11/16/2021  1:00 PM [facility] None  11/16/2021  1:30 PM Brunetta Genera, MD [facility] None     Thank you for choosing [facility]! Take care and be well!  [facility]  [place], [place] [address] [phone]    Information on my medicine - ELIQUIS (apixaban)  Why was Eliquis prescribed for you? Eliquis was prescribed for you to reduce the risk of a blood clot forming that can cause a stroke if you have a medical condition called atrial fibrillation (a type of irregular heartbeat).  What do You need to know about Eliquis ? Take your Eliquis TWICE DAILY - one tablet in the morning and one tablet in the evening with or without food. If you have difficulty swallowing the tablet whole please discuss with your pharmacist how to take the  medication safely.  Take Eliquis exactly as prescribed by your doctor and DO NOT stop taking Eliquis without talking to the doctor who prescribed the medication.  Stopping may increase your risk of developing a stroke.  Refill your prescription before you run out.  After discharge, you should have regular check-up appointments with your healthcare provider that is prescribing your Eliquis.  In the future your dose may need to be changed if your kidney function or weight changes by a significant amount or as you get older.  What do you do if you miss a dose? If you miss a dose, take it as soon as you remember on the same day and resume taking twice daily.  Do not take more than one dose of ELIQUIS at the same time to make up a missed dose.  Important Safety Information A possible side effect of Eliquis is bleeding. You should call your healthcare provider right away if you experience any of the following: Bleeding from an injury or your nose that does not stop. Unusual colored urine (red or dark brown) or unusual colored stools (red or black). Unusual bruising for unknown reasons. A serious fall or if you hit your head (even if there is no bleeding).  Some medicines may interact with Eliquis and might increase your risk of bleeding or clotting while on Eliquis. To help avoid this, consult your healthcare provider or pharmacist prior to using any new prescription or non-prescription medications, including herbals, vitamins, non-steroidal anti-inflammatory drugs (NSAIDs) and supplements.  This website has more information on Eliquis (apixaban): http://www.eliquis.com/eliquis/home

## 2021-10-11 NOTE — Progress Notes (Signed)
[facility]   Reason for visit: Follow up on shortness of breath  Interval History: no positive growth from cultures.  WBC 7.3.  No fever.     Physical Exam: Constitutional:  Vitals:   10/11/21 1122 10/11/21 1138  BP:  130/66  Pulse: 89 98  Resp:  18  Temp:  98 F (36.7 C)  SpO2: 93% 90%   patient appears in NAD Respiratory: Normal respiratory effort; CTA B   Review of Systems: Constitutional: negative for fevers and chills  Lab Results  Component Value Date   WBC 7.3 10/11/2021   HGB 10.5 (L) 10/11/2021   HCT 32.9 (L) 10/11/2021   MCV 78.5 (L) 10/11/2021   PLT 242 10/11/2021    Lab Results  Component Value Date   CREATININE 0.88 10/11/2021   BUN 15 10/11/2021   NA 138 10/11/2021   K 3.9 10/11/2021   CL 100 10/11/2021   CO2 25 10/11/2021    Lab Results  Component Value Date   ALT 29 10/08/2021   AST 27 10/08/2021   ALKPHOS 81 10/08/2021     Microbiology: Recent Results (from the past 240 hour(s))  Respiratory (~20 pathogens) panel by PCR     Status: None   Collection Time: 10/08/21  6:36 AM   Specimen: Respiratory  Result Value Ref Range Status   Adenovirus NOT DETECTED NOT DETECTED Final   Coronavirus 229E NOT DETECTED NOT DETECTED Final    Comment: (NOTE) The Coronavirus on the Respiratory Panel, DOES NOT test for the novel  Coronavirus (2019 nCoV)    Coronavirus HKU1 NOT DETECTED NOT DETECTED Final   Coronavirus NL63 NOT DETECTED NOT DETECTED Final   Coronavirus OC43 NOT DETECTED NOT DETECTED Final   Metapneumovirus NOT DETECTED NOT DETECTED Final   Rhinovirus / Enterovirus NOT DETECTED NOT DETECTED Final   Influenza A NOT DETECTED NOT DETECTED Final   Influenza B NOT DETECTED NOT DETECTED Final   Parainfluenza Virus 1 NOT DETECTED NOT DETECTED Final   Parainfluenza Virus 2 NOT DETECTED NOT DETECTED Final   Parainfluenza Virus 3 NOT DETECTED NOT DETECTED Final   Parainfluenza Virus 4 NOT DETECTED NOT DETECTED  Final   Respiratory Syncytial Virus NOT DETECTED NOT DETECTED Final   Bordetella pertussis NOT DETECTED NOT DETECTED Final   Bordetella Parapertussis NOT DETECTED NOT DETECTED Final   Chlamydophila pneumoniae NOT DETECTED NOT DETECTED Final   Mycoplasma pneumoniae NOT DETECTED NOT DETECTED Final    Comment: Performed at [facility], [place] [address] [place], [place] [address]  Culture, blood (routine x 2) Call MD if unable to obtain prior to antibiotics being given     Status: None (Preliminary result)   Collection Time: 10/08/21 11:09 AM   Specimen: BLOOD  Result Value Ref Range Status   Specimen Description BLOOD RIGHT ANTECUBITAL  Final   Special Requests   Final    BOTTLES DRAWN AEROBIC AND ANAEROBIC Blood Culture adequate volume   Culture   Final    NO GROWTH 3 DAYS Performed at [facility], [place] [address] [place], [place] [address]    Report Status PENDING  Incomplete    Impression/Plan:  1. ILD flare - at this point, no new concerns for infection with no findings on respiratory panel, negative blood cultures, no fever, no leukocytosis.  CT with typical ground glass opacities with her recent COVID-19 infection.   Will stop antibiotics  2.  Opportunistic infection risk - on bactrim  for pneumocystis risk and can continue with that while on prednisone.    I will sign off, call with questions.

## 2021-10-11 NOTE — Progress Notes (Signed)
Progress Note  Patient Name: Meghan Alexander Date of Encounter: 10/11/2021  [facility] Cardiologist: Dr. Christinia Gully   Subjective   Heart rate significantly improved and no longer sinus tachycardia after we placed her back on her home dose of beta-blocker that she had forgotten to tell the admitting doctor she was on at home.  She had beta-blocker withdrawal with persistent significant tachycardia that now has resolved.  She denies any chest pain or shortness of breath.   Scheduled Meds:  acyclovir  400 mg Oral Daily   apixaban  5 mg Oral BID   diltiazem  60 mg Oral Q6H   insulin aspart  0-9 Units Subcutaneous TID WC   insulin aspart  5 Units Subcutaneous TID WC   insulin glargine-yfgn  10 Units Subcutaneous QHS   losartan  50 mg Oral Daily   metoprolol succinate  25 mg Oral Daily   pantoprazole  40 mg Oral Daily   predniSONE  20 mg Oral Q breakfast   rosuvastatin  10 mg Oral Daily   sulfamethoxazole-trimethoprim  1 tablet Oral Q M,W,F   venlafaxine XR  37.5 mg Oral Q breakfast   Continuous Infusions:  cefTRIAXone (ROCEPHIN)  IV 2 g (10/11/21 0600)   PRN Meds: acetaminophen **OR** acetaminophen, albuterol, lip balm   Vital Signs    Vitals:   10/10/21 2300 10/11/21 0100 10/11/21 0605 10/11/21 0727  BP:  (!) 119/51 133/76 (!) 107/94  Pulse: (!) 109 85 88 92  Resp:  20 (!) 24 18  Temp:  98.6 F (37 C) 98 F (36.7 C) 98 F (36.7 C)  TempSrc:  Oral Oral Oral  SpO2:  97% 97% 96%  Weight:   73.6 kg   Height:        Intake/Output Summary (Last 24 hours) at 10/11/2021 0938 Last data filed at 10/11/2021 0903 Gross per 24 hour  Intake 783.99 ml  Output --  Net 783.99 ml       10/11/2021    6:05 AM 10/10/2021   12:16 AM 10/09/2021    5:00 AM  Last 3 Weights  Weight (lbs) 162 lb 4.8 oz 165 lb 164 lb 4.8 oz  Weight (kg) 73.619 kg 74.844 kg 74.526 kg      Telemetry    Normal sinus rhythm- Personally Reviewed  ECG    No new EKG to review- Personally  Reviewed  Physical Exam   GEN: Well nourished, well developed in no acute distress HEENT: Normal NECK: No JVD; No carotid bruits LYMPHATICS: No lymphadenopathy CARDIAC:RRR, no murmurs, rubs, gallops RESPIRATORY:  Clear to auscultation without rales, wheezing or rhonchi  ABDOMEN: Soft, non-tender, non-distended MUSCULOSKELETAL:  No edema; No deformity  SKIN: Warm and dry NEUROLOGIC:  Alert and oriented x 3 PSYCHIATRIC:  Normal affect   Labs    High Sensitivity Troponin:   Recent Labs  Lab 10/08/21 0507 10/08/21 0600  TROPONINIHS 9 16       Chemistry Recent Labs  Lab 10/08/21 0507 10/09/21 0157 10/10/21 0338 10/11/21 0432  NA 141 134* 139 138  K 4.2 3.7 3.9 3.9  CL 100 99 100 100  CO2 '26 23 26 25  '$ GLUCOSE 113* 131* 141* 207*  BUN '18 16 11 15  '$ CREATININE 0.91 0.94 0.84 0.88  CALCIUM 9.9 8.9 9.2 9.2  PROT 6.9  --   --   --   ALBUMIN 3.4*  --   --   --   AST 27  --   --   --  ALT 29  --   --   --   ALKPHOS 81  --   --   --   BILITOT 0.5  --   --   --   GFRNONAA >60 >60 >60 >60  ANIONGAP '15 12 13 13      '$ Hematology Recent Labs  Lab 10/09/21 0157 10/10/21 0338 10/11/21 0432  WBC 9.0 8.6 7.3  RBC 4.04 4.00 4.19  HGB 10.1* 10.3* 10.5*  HCT 32.0* 31.6* 32.9*  MCV 79.2* 79.0* 78.5*  MCH 25.0* 25.8* 25.1*  MCHC 31.6 32.6 31.9  RDW 16.7* 16.5* 16.5*  PLT 227 231 242     BNPNo results for input(s): "BNP", "PROBNP" in the last 168 hours.   DDimer No results for input(s): "DDIMER" in the last 168 hours.   CHA2DS2-VASc Score = 4   This indicates a 4.8% annual risk of stroke. The patient's score is based upon: CHF History: 0 HTN History: 1 Diabetes History: 1 Stroke History: 0 Vascular Disease History: 0 Age Score: 1 Gender Score: 1    Radiology    No results found.  Cardiac Studies   2D echo 10/08/2021 IMPRESSIONS    1. Left ventricular ejection fraction, by estimation, is 55 to 60%. The  left ventricle has normal function. The left  ventricle has no regional  wall motion abnormalities. Left ventricular diastolic parameters are  indeterminate.   2. Right ventricular systolic function is normal. The right ventricular  size is normal.   3. The mitral valve is abnormal. Trivial mitral valve regurgitation. No  evidence of mitral stenosis.   4. The aortic valve is tricuspid. Aortic valve regurgitation is not  visualized. No aortic stenosis is present.   5. The inferior vena cava is dilated in size with >50% respiratory  variability, suggesting right atrial pressure of 8 mmHg.   Patient Profile     66 y.o. 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 Erin Hearing.  Assessment & Plan    Paroxysmal atrial fibrillation with RVR -Likely related to pulmonary issue.   -She converted to sinus rhythm  -She was persistently tachycardic after converting to sinus but then we found out that she had been on a beta-blocker at home and forgot to tell the admitting physician so she developed persistent tachycardia related to beta-blocker withdrawal which has resolved after starting back on Toprol -Chest CTA showed no evidence of PE but did show groundglass opacities although overall improved from a month ago but worsened from chest x-ray 2 weeks ago and felt to likely represent pneumonia -TSH is normal -Started on Eliquis given CHA2DS2-VASc score of 4 -Echo this admission showed normal LV function -Continue Eliquis 5 mg twice daily, Toprol-XL 25 mg daily and transition Cardizem short-acting to Cardizem CD 240 mg daily  Community-acquired pneumonia -Per [facility]   Hypertension -BP controlled at 107/94 mmHg -Short acting Cardizem to Cardizem CD 240 mg daily, continue Lopressor 25 mg a day and losartan 50 mg daily   Hyperlipidemia: -On Crestor 10 mg  daily.  - Previous LDL 113 on 08/08/2021   History of ILD pneumonitis:  -Followed by PCCM as outpatient.  [facility] will sign off.   Medication Recommendations: Cardizem CD 240 mg daily, Toprol-XL 25 mg daily, losartan 50 mg daily, apixaban 5 mg daily, Crestor 10 mg daily Other recommendations (  labs, testing, etc): None Follow up as an outpatient: Outpatient follow-up with Dr. Margaretann Loveless or extender in 2 weeks      For questions or updates, please contact [place] Please consult [email or web address] for contact info under        Signed, Fransico Him, MD  10/11/2021, 9:38 AM

## 2021-10-11 NOTE — Progress Notes (Signed)
6/19 Spoke to patient via telephone, patient rcv'd initial letter. Letter has been explained and acknowledged.

## 2021-10-11 NOTE — TOC Transition Note (Signed)
Transition of Care [facility]) - CM/SW Discharge Note   Patient Details  Name: Meghan Alexander MRN: [id number] Date of Birth: June 11, 1955  [facility] of [facility] CM/SW Contact:  Zenon Mayo, RN Phone Number: 10/11/2021, 1:26 PM   Clinical Narrative:    Patient is for dc today, her spouse will transport her home today.  She has w/chair, walking boot, crutches and home oxygen 2 liters with [employer] on exertion.     Final next level of care: Home/Self Care Barriers to Discharge: No Barriers Identified   Patient Goals and CMS Choice Patient states their goals for this hospitalization and ongoing recovery are:: return home with spouse   Choice offered to / list presented to : NA  Discharge Placement                       Discharge Plan and Services In-house Referral: NA Discharge Planning Services: CM Consult Post Acute Care Choice: NA            DME Agency: NA       HH Arranged: NA          Social Determinants of Health (SDOH) Interventions     Readmission Risk Interventions    10/11/2021    1:21 PM  Readmission Risk Prevention Plan  PCP or Specialist Appt within 3-5 Days Complete  HRI or Home Care Consult Complete  Medication Review (RN Care Manager) Complete

## 2021-10-11 NOTE — Plan of Care (Signed)
DISCHARGE NOTE HOME Meghan Alexander to be discharged home per MD order. Discussed prescriptions and follow up appointments with the patient. Prescriptions given to patient; medication list explained in detail. Patient verbalized understanding.  Skin clean, dry and intact without evidence of skin break down, no evidence of skin tears noted. IV catheter discontinued intact. Site without signs and symptoms of complications. Dressing and pressure applied. Pt denies pain at the site currently. No complaints noted.  Patient free of lines, drains, and wounds.   An After Visit Summary (AVS) was printed and given to the patient. Patient escorted via wheelchair, and discharged home via private auto.  Stephan Minister, RN

## 2021-10-11 NOTE — Inpatient Diabetes Management (Addendum)
Inpatient Diabetes Program Recommendations  AACE/ADA: New Consensus Statement on Inpatient Glycemic Control (2015)  Target Ranges:  Prepandial:   less than 140 mg/dL      Peak postprandial:   less than 180 mg/dL (1-2 hours)      Critically ill patients:  140 - 180 mg/dL   Lab Results  Component Value Date   GLUCAP 184 (H) 10/11/2021   HGBA1C 7.5 (H) 08/08/2021    Review of Glycemic Control  Latest Reference Range & Units 10/10/21 06:01 10/10/21 15:35 10/10/21 21:44 10/11/21 05:46 10/11/21 11:37  Glucose-Capillary 70 - 99 mg/dL 125 (H) 280 (H) 301 (H) 174 (H) 184 (H)  (H): Data is abnormally high  Diabetes history: DM2 Outpatient Diabetes medications: Levemir 20 units qd, Humalog 30 units tid meal coverage Current orders for Inpatient glycemic control: Semglee 10 units qd, Novolog 5 units tid meal coverage, Novolog 0-15 units tid  Inpatient Diabetes Program Recommendations:   Spoke with patient @ bedside regarding diabetes management. Patient had hypoglycemia after increasing meal coverage to 30 units tid. Patient is interested in carbohydrate counting and starting outpatient diabetes education. Patient has lost 30 lbs since January of this year. Discussed basic carbohydrate counting and CBGs with patient. Patient would like to try Libre glucose sensor for home use if ordered by physician. # [id number]  Thank you, Nani Gasser. Jacquis Paxton, RN, MSN, CDE  Diabetes Coordinator Inpatient Glycemic Control Team Team Pager [phone] (8am-5pm) 10/11/2021 2:16 PM

## 2021-10-11 NOTE — Plan of Care (Signed)
  Problem: Activity: Goal: Ability to tolerate increased activity will improve Outcome: Progressing   Problem: Respiratory: Goal: Ability to maintain a clear airway will improve Outcome: Progressing   Problem: Activity: Goal: Ability to tolerate increased activity will improve Outcome: Progressing   Problem: Respiratory: Goal: Ability to maintain adequate ventilation will improve Outcome: Progressing   Problem: Activity: Goal: Ability to tolerate increased activity will improve Outcome: Progressing

## 2021-10-11 NOTE — Discharge Summary (Addendum)
[facility] Discharge Summary  Patient name: Meghan Alexander Medical record number: [id number] Date of birth: [birth year] Age: 66 y.o. Gender: female Date of Admission: 10/08/2021  Date of Discharge: 10/11/2021 Admitting Physician: Gladys Damme, MD  Primary Care Provider: [name], MD Consultants: Cardiology  Indication for Hospitalization: A-fib RVR  Discharge Diagnoses/Problem List:  Principal Problem:   Atrial fibrillation with RVR [facility]) Active Problems:   Sinus tachycardia   CAP (community acquired pneumonia)   Uncontrolled type 2 diabetes mellitus with hyperglycemia ([place])   ILD (interstitial lung disease) ([place])    Disposition: Home  Discharge Condition: Stable  Discharge Exam:  Blood pressure 130/66, pulse 98, temperature 98 F (36.7 C), temperature source Oral, resp. rate 18, height 5' 2.01" (1.575 m), weight 73.6 kg, SpO2 90 %.  General: awake, alert, NAD CV: RRR, no murmurs auscultated Pulm: CTAB, normal WOB Abdomen: soft, nondistended, nontender, normoactive BS  Brief Hospital Course:  Meghan Alexander is a 66 y.o.female who was admitted to the [facility] Teaching Service at [facility] for a-fib with RVR.   A-Fib with RVR Patient presented with new onset a-fib with RVR. She was started on diltiazem drip and then converted to sinus rhythm. She remained in sinus tachycardia with rates in the 120s until it was discovered she was taking metoprolol at home. After starting Toprol-XL 25mg  daily she was in NSR. Patient also started on Eliquis for CHADSVASC of 4. Cardiology followed her throughout admission.  CAP Patient had leukocytosis and CXR suspicious for pneumonia on admission. She was treated with azithromycin x3 days and ceftriaxone x4 days for possible CAP. Ultimately her leukocytosis was felt to be leukamoid reaction to hypoglycemia as her CT chest was actually improved from prior imaging with no evidence of acute pneumonia.  Type 2  Diabetes Patient initially presented with hypoglycemia to 40 after her home insulin regimen had been increased. Her CBGs were monitored throughout admission and she was discharged on 10 units long acting insulin and 5 units short acting TID with meals.  ILD Maintained on home prednisone taper with Bactrim prophylaxis as per Dr Erin Fulling (outpatient pulm)  PCP Follow-up Recommendations:  Follow up about diarrhea  Started on Cardizem 60mg  4 times daily, Toprol- XL 25mg  and Eliquis 5 mg daily. Changes to long acting Discontinued amlodipine   Significant Procedures: None  Significant Labs and Imaging:  Recent Labs  Lab 10/09/21 0157 10/10/21 0338 10/11/21 0432  WBC 9.0 8.6 7.3  HGB 10.1* 10.3* 10.5*  HCT 32.0* 31.6* 32.9*  PLT 227 231 242   Recent Labs  Lab 10/08/21 0507 10/09/21 0157 10/10/21 0338 10/11/21 0432  NA 141 134* 139 138  K 4.2 3.7 3.9 3.9  CL 100 99 100 100  CO2 26 23 26 25   GLUCOSE 113* 131* 141* 207*  BUN 18 16 11 15   CREATININE 0.91 0.94 0.84 0.88  CALCIUM 9.9 8.9 9.2 9.2  ALKPHOS 81  --   --   --   AST 27  --   --   --   ALT 29  --   --   --   ALBUMIN 3.4*  --   --   --      Results/Tests Pending at Time of Discharge: None  Discharge Medications:  Allergies as of 10/11/2021       Reactions   Latex Other (See Comments)   Blisters / skin peeling   Codeine Nausea And Vomiting, Other (See Comments)   hallucinations   Atenolol Cough  Other Other (See Comments)   DermaPlast; Used after surgery - caused swelling, itching, and wound broke open   Hydrocodone Nausea And Vomiting   Oxycodone Nausea And Vomiting        Medication List     STOP taking these medications    amLODipine 10 MG tablet Commonly known as: NORVASC       TAKE these medications    Accu-Chek Guide test strip Generic drug: glucose blood Use to test blood sugars up to 4 times daily as directed.   Accu-Chek Guide w/Device Kit Use to test blood sugars up to 4 times  daily.   Accu-Chek Softclix Lancets lancets Use to test blood sugars up to 4 times daily as directed.   acetaminophen 325 MG tablet Commonly known as: TYLENOL Take 2 tablets (650 mg total) by mouth every 8 (eight) hours as needed.   acyclovir 400 MG tablet Commonly known as: ZOVIRAX TAKE 1 TABLET(400 MG) BY MOUTH DAILY What changed: See the new instructions.   albuterol 108 (90 Base) MCG/ACT inhaler Commonly known as: VENTOLIN HFA Inhale 2 puffs into the lungs daily as needed for shortness of breath.   Cartia XT 240 MG 24 hr capsule Generic drug: diltiazem Take 1 capsule (240 mg total) by mouth daily.   Combivent Respimat 20-100 MCG/ACT Aers respimat Generic drug: Ipratropium-Albuterol Inhale 1 puff into the lungs every 6 (six) hours as needed for wheezing.   diphenhydrAMINE 25 MG tablet Commonly known as: BENADRYL Take 25 mg by mouth at bedtime.   Eliquis 5 MG Tabs tablet Generic drug: apixaban Take 1 tablet (5 mg total) by mouth 2 (two) times daily.   feeding supplement Liqd Take 237 mLs by mouth 3 (three) times daily between meals. What changed: when to take this   ferrous sulfate 325 (65 FE) MG tablet Take 1 tablet (325 mg total) by mouth every other day.   fluticasone 50 MCG/ACT nasal spray Commonly known as: FLONASE Place 2 sprays into both nostrils daily.   HumaLOG KwikPen 100 UNIT/ML KwikPen Generic drug: insulin lispro Inject 5 Units into the skin with breakfast, with lunch, and with evening meal. What changed: how much to take   insulin detemir 100 UNIT/ML FlexPen Commonly known as: LEVEMIR Inject 10 Units into the skin daily. What changed: how much to take   ipratropium 0.03 % nasal spray Commonly known as: ATROVENT Place 1 spray into both nostrils daily.   losartan 50 MG tablet Commonly known as: COZAAR Take 50 mg by mouth daily.   Melatonin 10 MG Caps Take 10 mg by mouth at bedtime.   metFORMIN 500 MG tablet Commonly known as:  GLUCOPHAGE Take 500 mg by mouth daily with breakfast.   metoprolol succinate 25 MG 24 hr tablet Commonly known as: TOPROL-XL Take 1 tablet (25 mg total) by mouth daily. Start taking on: October 12, 2021   multivitamin tablet Take 1 tablet by mouth daily.   omeprazole 20 MG capsule Commonly known as: PRILOSEC Take 20 mg by mouth daily.   Pentips 32G X 4 MM Misc Generic drug: Insulin Pen Needle Use to inject insulin up to 4 times daily as directed.   predniSONE 20 MG tablet Commonly known as: DELTASONE Take 1 tablet (20 mg total) by mouth daily with breakfast for 13 days. What changed: how much to take   predniSONE 10 MG tablet Commonly known as: DELTASONE Take 1 tablet (10 mg total) by mouth daily with breakfast. Start taking on: October 25, 2021 What  changed: These instructions start on October 25, 2021. If you are unsure what to do until then, ask your doctor or other care provider.   rosuvastatin 10 MG tablet Commonly known as: CRESTOR Take 10 mg by mouth daily.   sulfamethoxazole-trimethoprim 800-160 MG tablet Commonly known as: BACTRIM DS Take 1 tablet by mouth every Monday, Wednesday, and Friday for 13 days.   venlafaxine XR 37.5 MG 24 hr capsule Commonly known as: EFFEXOR-XR Take 37.5 mg by mouth daily with breakfast.   VITAMIN B-3 PO Take 1 tablet by mouth daily.        Discharge Instructions: Please refer to Patient Instructions section of EMR for full details.  Patient was counseled important signs and symptoms that should prompt return to medical care, changes in medications, dietary instructions, activity restrictions, and follow up appointments.   Follow-Up Appointments: Future Appointments  Date Time Provider [place]  11/05/2021 10:30 AM Freddi Starr, MD [facility] None  11/16/2021  1:00 PM [facility] None  11/16/2021  1:30 PM Brunetta Genera, MD [facility] None    Alen Bleacher, MD 10/11/2021, 2:39 PM PGY-1, [place]

## 2021-10-11 NOTE — Progress Notes (Signed)
FPTS Interim Progress Note  S:Went to bedside to check on patient, she was sleeping so I did not wake her.   O: BP (!) 119/51 (BP Location: Left Arm)   Pulse 85   Temp 98.6 F (37 C) (Oral)   Resp 20   Ht 5' 2.01" (1.575 m)   Wt 74.8 kg   SpO2 97%   BMI 30.17 kg/m   General: Patient resting comfortably in bed, in no acute distress.  A/P: Vitals stable. Cardiac telemetry reviewed and appropriate. Orders reviewed. Continue plan per day team.   Donney Dice, DO 10/11/2021, 2:36 AM PGY-2, [place] [facility] pager [phone]

## 2021-10-12 LAB — SJOGRENS SYNDROME-B EXTRACTABLE NUCLEAR ANTIBODY: SSB (La) (ENA) Antibody, IgG: <0.2 AI (ref 0.0–0.9)

## 2021-10-12 LAB — ANA W/REFLEX IF POSITIVE: Anti Nuclear Antibody (ANA): NEGATIVE

## 2021-10-12 LAB — ANTI-SCLERODERMA ANTIBODY: Scleroderma (Scl-70) (ENA) Antibody, IgG: <0.2 AI (ref 0.0–0.9)

## 2021-10-12 LAB — CENTROMERE ANTIBODIES: Centromere Ab Screen: <0.2 AI (ref 0.0–0.9)

## 2021-10-12 LAB — CYCLIC CITRUL PEPTIDE ANTIBODY, IGG/IGA: CCP Antibodies IgG/IgA: 1 units (ref 0–19)

## 2021-10-12 LAB — SJOGRENS SYNDROME-A EXTRACTABLE NUCLEAR ANTIBODY: SSA (Ro) (ENA) Antibody, IgG: <0.2 AI (ref 0.0–0.9)

## 2021-10-13 LAB — CULTURE, BLOOD (ROUTINE X 2)
Culture: NO GROWTH
Special Requests: ADEQUATE

## 2021-10-13 LAB — ANCA TITERS
Atypical P-ANCA titer: <1:20 {titer}
C-ANCA: <1:20 {titer}
P-ANCA: <1:20 {titer}

## 2021-10-19 NOTE — Progress Notes (Signed)
Cardiology Clinic Note   Patient Name: Meghan Alexander Date of Encounter: 10/21/2021  Primary Care Provider:  [name], MD Primary Cardiologist:  Dr. Margaretann Loveless   Patient Profile    66 year old female with known history of achalasia with steroid therapy, HTN, DM2,follicular lymphedema. 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 Bronchoscopy with BAL right middle lobe negative for malignancy.   Came back to the ED early this morning due to concern of altered mental status and the hypoglycemia with initial glucose 42.  On arrival, patient was noted to be in A-fib with RVR.  She was started on IV diltiazem. Transitioned to Toprol XL once in NSR. Placed on apixaban 5 mg BID, CHADS VASC Score of 4 (HTN, Age, Gender, Diabetes). . 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 ([place]) 04/02/2019   GERD (gastroesophageal reflux disease)    hx of reflux-went away with elevating HOB    Hypertension    Pneumonia    hx of    PONV (postoperative nausea and vomiting)    Type 2 diabetes mellitus ([place]) 08/02/2021   Past Surgical History:  Procedure Laterality Date   ABDOMINAL HYSTERECTOMY     ADENOIDECTOMY     BRONCHIAL BRUSHINGS  08/03/2021   Procedure: BRONCHIAL BRUSHINGS;  Surgeon: Collene Gobble, MD;  Location: [facility];  Service: Cardiopulmonary;;   BRONCHIAL WASHINGS  08/03/2021   Procedure: BRONCHIAL WASHINGS;  Surgeon: Collene Gobble, MD;  Location: [facility];  Service: Cardiopulmonary;;   cosmetic surgeries     ESOPHAGEAL MANOMETRY N/A 12/02/2015   Procedure: ESOPHAGEAL MANOMETRY (EM);  Surgeon: [name], MD;  Location: [facility];  Service: Endoscopy;  Laterality: N/A;    ESOPHAGOGASTRODUODENOSCOPY N/A 12/02/2015   Procedure: ESOPHAGOGASTRODUODENOSCOPY (EGD);  Surgeon: [name], MD;  Location: [facility];  Service: Endoscopy;  Laterality: N/A;   lymph node removal left leg     cat scratch surgery    ORIF ANKLE FRACTURE Right 09/03/2021   Procedure: OPEN REDUCTION INTERNAL FIXATION (ORIF) ANKLE FRACTURE;  Surgeon: Willaim Sheng, MD;  Location: [facility];  Service: Orthopedics;  Laterality: Right;   THROAT SURGERY     sleep apnea surgery    TONSILLECTOMY     VIDEO BRONCHOSCOPY  08/03/2021   Procedure: VIDEO BRONCHOSCOPY WITH FLUORO;  Surgeon: Collene Gobble, MD;  Location: [facility];  Service: Cardiopulmonary;;    Allergies  Allergies  Allergen Reactions   Latex Other (See Comments)    Blisters / skin peeling   Codeine Nausea And Vomiting and Other (See Comments)    hallucinations   Atenolol Cough   Other Other (See Comments)    DermaPlast; Used after surgery - caused swelling, itching, and wound broke open   Hydrocodone Nausea And Vomiting   Oxycodone Nausea And Vomiting    History of Present Illness    [name]  new onset a-fib with RVR. She was started on diltiazem drip and then converted to sinus rhythm. She remained in sinus tachycardia with rates in the 120s until it was discovered she was taking metoprolol at home. After starting Toprol-XL 49m daily she was in NSR. Patient also started on Eliquis for CHADSVASC of 4. Recent  admission for CAP, Type Diabetes.    She comes today feeling much better.  Breathing is improved.  She could not tell that she was in atrial fibrillation.  She continues to deal with multiple medical issues noncardiac related.  She has a very good attitude about all of this.  She denies chest pain, melena, or epistaxis on Eliquis.  She is medically compliant.  Home Medications    Current Outpatient Medications  Medication Sig Dispense Refill   Accu-Chek Softclix Lancets lancets Use to test blood sugars up to  4 times daily as directed. 100 each 0   acetaminophen (TYLENOL) 325 MG tablet Take 2 tablets (650 mg total) by mouth every 8 (eight) hours as needed. 90 tablet 0   acyclovir (ZOVIRAX) 400 MG tablet TAKE 1 TABLET(400 MG) BY MOUTH DAILY (Patient taking differently: Take 400 mg by mouth daily.) 30 tablet 6   albuterol (VENTOLIN HFA) 108 (90 Base) MCG/ACT inhaler Inhale 2 puffs into the lungs daily as needed for shortness of breath.     Blood Glucose Monitoring Suppl (BLOOD GLUCOSE MONITOR SYSTEM) w/Device KIT Use to test blood sugars up to 4 times daily. 1 kit 0   diltiazem (CARDIZEM CD) 240 MG 24 hr capsule Take 1 capsule (240 mg total) by mouth daily. 30 capsule 0   diphenhydrAMINE (BENADRYL) 25 MG tablet Take 25 mg by mouth at bedtime.     feeding supplement (ENSURE ENLIVE / ENSURE PLUS) LIQD Take 237 mLs by mouth 3 (three) times daily between meals. (Patient taking differently: Take 237 mLs by mouth 2 (two) times daily between meals.) 237 mL 12   ferrous sulfate 325 (65 FE) MG tablet Take 1 tablet (325 mg total) by mouth every other day. 30 tablet 0   fluticasone (FLONASE) 50 MCG/ACT nasal spray Place 2 sprays into both nostrils daily.     glucose blood (ACCU-CHEK GUIDE) test strip Use to test blood sugars up to 4 times daily as directed. 100 each 0   HUMALOG KWIKPEN 100 UNIT/ML KwikPen Inject 5 Units into the skin with breakfast, with lunch, and with evening meal. 15 mL 11   insulin detemir (LEVEMIR) 100 UNIT/ML FlexPen Inject 10 Units into the skin daily. 15 mL 0   Insulin Pen Needle 32G X 4 MM MISC Use to inject insulin up to 4 times daily as directed. 100 each 0   ipratropium (ATROVENT) 0.03 % nasal spray Place 1 spray into both nostrils daily.     Ipratropium-Albuterol (COMBIVENT) 20-100 MCG/ACT AERS respimat Inhale 1 puff into the lungs every 6 (six) hours as needed for wheezing. 4 g 0   losartan (COZAAR) 50 MG tablet Take 50 mg by mouth daily.     Melatonin 10 MG CAPS Take 10 mg by mouth at  bedtime.      metFORMIN (GLUCOPHAGE) 500 MG tablet Take 500 mg by mouth daily with breakfast.     metoprolol succinate (TOPROL-XL) 25 MG 24 hr tablet Take 1 tablet (25 mg total) by mouth daily. 30 tablet 0   Multiple Vitamin (MULTIVITAMIN) tablet Take 1 tablet by mouth daily.     Niacin (VITAMIN B-3 PO) Take 1 tablet by mouth daily.     omeprazole (PRILOSEC) 20 MG capsule Take 20 mg by mouth daily.     [START ON 10/25/2021] predniSONE (DELTASONE) 10 MG tablet Take 1 tablet (10 mg total) by mouth daily with breakfast. 30 tablet 2   predniSONE (DELTASONE) 20 MG tablet Take 1 tablet (20 mg total)  by mouth daily with breakfast for 13 days.     rosuvastatin (CRESTOR) 10 MG tablet Take 10 mg by mouth daily.     sulfamethoxazole-trimethoprim (BACTRIM DS) 800-160 MG tablet Take 1 tablet by mouth every Monday, Wednesday, and Friday for 13 days.     venlafaxine XR (EFFEXOR-XR) 37.5 MG 24 hr capsule Take 37.5 mg by mouth daily with breakfast.     apixaban (ELIQUIS) 5 MG TABS tablet Take 1 tablet (5 mg total) by mouth 2 (two) times daily. 60 tablet 3   No current facility-administered medications for this visit.     Family History    Family History  Problem Relation Age of Onset   Lung cancer Father        1982 died from it   Hernia Mother    Breast cancer Maternal Aunt        multiple    Breast cancer Maternal Grandmother    Breast cancer Paternal Grandmother    Breast cancer Cousin        cousins multiple    Colon cancer Neg Hx    Colon polyps Neg Hx    Rectal cancer Neg Hx    Stomach cancer Neg Hx    She indicated that the status of her mother is unknown. She indicated that the status of her father is unknown. She indicated that the status of her maternal grandmother is unknown. She indicated that the status of her paternal grandmother is unknown. She indicated that the status of her maternal aunt is unknown. She indicated that the status of her cousin is unknown. She indicated that the status  of her neg hx is unknown.  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  Cardiovascular:  No chest pain, dyspnea on exertion, edema, orthopnea, palpitations, paroxysmal nocturnal dyspnea. Dermatological: No rash, lesions/masses Respiratory: No cough, dyspnea Urologic: No hematuria, dysuria Abdominal:   No nausea, vomiting, diarrhea, bright red blood per rectum, melena, or hematemesis Neurologic:  No visual changes, wkns, changes in mental status. All other systems reviewed and are otherwise negative except as noted above.     Physical Exam    VS:  BP 132/75   Pulse  83   Ht _0  (1.575 m)   Wt 168 lb (76.2 kg)   SpO2 98%   BMI 30.73 kg/m  , BMI Body mass index is 30.73 kg/m.     GEN: Well nourished, well developed, in no acute distress. HEENT: normal. Neck: Supple, no JVD, soft left carotid bruits, or  masses. Cardiac: RRR, no murmurs, rubs, or gallops. No clubbing, cyanosis, edema.  Radials/DP/PT 2+ and equal bilaterally.  Respiratory:  Respirations regular and unlabored, clear to auscultation bilaterally. GI: Soft, nontender, nondistended, BS + x 4. MS: no deformity or atrophy. Skin: warm and dry, no rash. Neuro:  Strength and sensation are intact. Psych: Normal affect.  Accessory Clinical Findings    ECG personally reviewed by me today-sinus rhythm with first-degree AV block, PR interval 210 ms.  Heart rate of 91 bpm- No acute changes  Lab Results  Component Value Date   WBC 7.3 10/11/2021   HGB 10.5 (L) 10/11/2021   HCT 32.9 (L) 10/11/2021   MCV 78.5 (L) 10/11/2021   PLT 242 10/11/2021   Lab Results  Component Value Date   CREATININE 0.88 10/11/2021   BUN 15 10/11/2021   NA 138 10/11/2021   K 3.9 10/11/2021   CL 100 10/11/2021   CO2 25 10/11/2021   Lab Results  Component Value Date   ALT 29 10/08/2021   AST 27 10/08/2021   ALKPHOS 81 10/08/2021   BILITOT 0.5 10/08/2021   Lab Results  Component Value Date   CHOL 178 08/08/2021   HDL 40 (L) 08/08/2021   LDLCALC 113 (H) 08/08/2021   TRIG 126 08/08/2021   CHOLHDL 4.5 08/08/2021    Lab Results  Component Value Date   HGBA1C 7.5 (H) 08/08/2021    Review of Prior Studies: Echo 10/08/2021  1. Left ventricular ejection fraction, by estimation, is 55 to 60%. The  left ventricle has normal function. The left ventricle has no regional  wall motion abnormalities. Left ventricular diastolic parameters are  indeterminate.   2. Right ventricular systolic function is normal. The right ventricular  size is normal.   3. The mitral valve is abnormal. Trivial mitral valve regurgitation. No  evidence of mitral stenosis.   4. The aortic valve is tricuspid. Aortic valve regurgitation is not  visualized. No aortic stenosis is present.   5. The inferior vena cava is dilated in size with >50% respiratory  variability,  suggesting right atrial pressure of 8 mmHg.   Assessment & Plan   1.  Paroxysmal atrial fibrillation: Status post pharmacological conversion with diltiazem.  She remains on metoprolol XL 25 mg daily along with diltiazem 240 mg daily.  CHA2DS2-VASc score of 4 and remains on Eliquis 5 mg twice daily.  She is doing well remains in normal sinus rhythm.  Heart rate was elevated today on EKG, but on apical assessment heart rate 74 bpm.  Continue current medication regimen including Eliquis for an additional 6 weeks.  She will follow-up with Dr. Margaretann Loveless at which time recommendations concerning stopping Eliquis can be made.  2.  Hypertension: Blood pressures well controlled today on office visit.  No changes in her current medication regimen at this time.  3.  Hyperlipidemia: Continue statin therapy.  With rosuvastatin 10 mg daily.  Goal of LDL less than 70.  Consider dropping to less than 50 as she is high risk with hypertension, CAD, diabetes, and age.  4.  Type 2 diabetes: Now wearing subcu monitor.  Had hypoglycemia during recent hospitalization.  Diabetes is being followed closely by primary care.   Current medicines are reviewed at length with the patient today.  I have spent 30 min's  dedicated to the care of this patient on the date of this encounter to include pre-visit review of records, assessment, management and diagnostic testing,with shared decision making.  Signed, Phill Myron. West Pugh, ANP, AACC   10/21/2021 1:07 PM    [facility] [address] Office [phone] Fax [phone]  Notice: This dictation was prepared with Dragon dictation along with smaller phrase technology. Any transcriptional errors that result from this process are unintentional and may not be corrected upon review.

## 2021-10-21 ENCOUNTER — Encounter: Payer: Self-pay | Admitting: Adult Health

## 2021-10-21 ENCOUNTER — Ambulatory Visit: Payer: Medicare Other | Admitting: Adult Health

## 2021-10-21 VITALS — BP 132/75 | HR 83 | Ht 62.0 in | Wt 168.0 lb

## 2021-10-21 DIAGNOSIS — I4891 Unspecified atrial fibrillation: Secondary | ICD-10-CM | POA: Diagnosis not present

## 2021-10-21 DIAGNOSIS — Z79899 Other long term (current) drug therapy: Secondary | ICD-10-CM

## 2021-10-21 DIAGNOSIS — I10 Essential (primary) hypertension: Secondary | ICD-10-CM | POA: Diagnosis not present

## 2021-10-21 DIAGNOSIS — E876 Hypokalemia: Secondary | ICD-10-CM

## 2021-10-21 DIAGNOSIS — E1165 Type 2 diabetes mellitus with hyperglycemia: Secondary | ICD-10-CM

## 2021-10-21 MED ORDER — APIXABAN 5 MG PO TABS: 5.0000 mg | ORAL_TABLET | Freq: Two times a day (BID) | ORAL | 3 refills | Status: DC

## 2021-10-21 NOTE — Patient Instructions (Signed)
Medication Instructions:  Your physician recommends that you continue on your current medications as directed. Please refer to the Current Medication list given to you today.  *If you need a refill on your cardiac medications before your next appointment, please call your pharmacy*   Lab Work: CBC and BMET prior to 6 week follow up appointment If you have labs (blood work) drawn today and your tests are completely normal, you will receive your results only by: [place] (if you have MyChart) OR A paper copy in the mail If you have any lab test that is abnormal or we need to change your treatment, we will call you to review the results.  Follow-Up: At [facility], you and your health needs are our priority.  As part of our continuing mission to provide you with exceptional heart care, we have created designated Provider Care Teams.  These Care Teams include your primary Cardiologist (physician) and Advanced Practice Providers (APPs -  Physician Assistants and Nurse Practitioners) who all work together to provide you with the care you need, when you need it.  Your next appointment:   6 week(s)  The format for your next appointment:   In Person  Provider:   Dr Margaretann Loveless or Jory Sims, DNP, ANP    {  Important Information About Sugar

## 2021-10-21 NOTE — Addendum Note (Signed)
Addended by: Juventino Slovak on: 10/21/2021 05:09 PM   Modules accepted: Orders

## 2021-10-29 ENCOUNTER — Other Ambulatory Visit (HOSPITAL_COMMUNITY): Payer: Self-pay

## 2021-10-29 ENCOUNTER — Other Ambulatory Visit (HOSPITAL_BASED_OUTPATIENT_CLINIC_OR_DEPARTMENT_OTHER): Payer: Self-pay

## 2021-10-29 ENCOUNTER — Telehealth (HOSPITAL_COMMUNITY): Payer: Self-pay

## 2021-10-29 NOTE — Telephone Encounter (Signed)
Transitions of Care Pharmacy   Call attempted for a pharmacy transitions of care follow-up. HIPAA appropriate voicemail was left with call back information provided.   Call attempt #1. Will follow-up in 2-3 days.    Lind Guest, PharmD Clinical Pharmacy Resident  [facility] 10/29/2021 9:22 AM

## 2021-11-01 ENCOUNTER — Telehealth (HOSPITAL_COMMUNITY): Payer: Self-pay

## 2021-11-01 ENCOUNTER — Other Ambulatory Visit (HOSPITAL_COMMUNITY): Payer: Self-pay

## 2021-11-01 NOTE — Telephone Encounter (Signed)
Pharmacy Transitions of Care Follow-up Telephone Call  Date of discharge: 10/11/21  Discharge Diagnosis: Afib  How have you been since you were released from the hospital? She has been doing well without any complaints. Patient does not report any problems affording her medication and it was all transferred out to her home pharmacy. Re-emphasized the importance of daily adherence and signs/symptoms of bleeding.    Medication changes made at discharge: START taking these medications  START taking these medications  Cartia XT 240 MG 24 hr capsule Generic drug: diltiazem Take 1 capsule (240 mg total) by mouth daily.  metoprolol succinate 25 MG 24 hr tablet Commonly known as: TOPROL-XL Take 1 tablet (25 mg total) by mouth daily.  Eliquis $Remove'5mg'IajwdBU$  BID   CHANGE how you take these medications  CHANGE how you take these medications  acyclovir 400 MG tablet Commonly known as: ZOVIRAX TAKE 1 TABLET(400 MG) BY MOUTH DAILY What changed: See the new instructions.  feeding supplement Liqd Take 237 mLs by mouth 3 (three) times daily between meals. What changed: when to take this  HumaLOG KwikPen 100 UNIT/ML KwikPen Generic drug: insulin lispro Inject 5 Units into the skin with breakfast, with lunch, and with evening meal. What changed: how much to take  insulin detemir 100 UNIT/ML FlexPen Commonly known as: LEVEMIR Inject 10 Units into the skin daily. What changed: how much to take   CONTINUE taking these medications  CONTINUE taking these medications  Accu-Chek Guide test strip Generic drug: glucose blood Use to test blood sugars up to 4 times daily as directed.  Accu-Chek Guide w/Device Kit Use to test blood sugars up to 4 times daily.  Accu-Chek Softclix Lancets lancets Use to test blood sugars up to 4 times daily as directed.  acetaminophen 325 MG tablet Commonly known as: TYLENOL Take 2 tablets (650 mg total) by mouth every 8 (eight) hours as needed.  albuterol 108 (90 Base)  MCG/ACT inhaler Commonly known as: VENTOLIN HFA  Combivent Respimat 20-100 MCG/ACT Aers respimat Generic drug: Ipratropium-Albuterol Inhale 1 puff into the lungs every 6 (six) hours as needed for wheezing.  diphenhydrAMINE 25 MG tablet Commonly known as: BENADRYL  ferrous sulfate 325 (65 FE) MG tablet Take 1 tablet (325 mg total) by mouth every other day.  fluticasone 50 MCG/ACT nasal spray Commonly known as: FLONASE  ipratropium 0.03 % nasal spray Commonly known as: ATROVENT  losartan 50 MG tablet Commonly known as: COZAAR  Melatonin 10 MG Caps  metFORMIN 500 MG tablet Commonly known as: GLUCOPHAGE  multivitamin tablet  omeprazole 20 MG capsule Commonly known as: PRILOSEC  Pentips 32G X 4 MM Misc Generic drug: Insulin Pen Needle Use to inject insulin up to 4 times daily as directed.  * predniSONE 10 MG tablet Commonly known as: DELTASONE Take 1 tablet (10 mg total) by mouth daily with breakfast.  rosuvastatin 10 MG tablet Commonly known as: CRESTOR  venlafaxine XR 37.5 MG 24 hr capsule Commonly known as: EFFEXOR-XR  VITAMIN B-3 PO   very important  * This list has 1 medication(s) that are the same as other medications prescribed for you. Read the directions carefully, and ask your doctor or other care provider to review them with you.    STOP taking these medications  STOP taking these medications  amLODipine 10 MG tablet Commonly known as: NORVASC  predniSONE 20 MG tablet Commonly known as: DELTASONE  sulfamethoxazole-trimethoprim 800-160 MG tablet Commonly known as: BACTRIM DS     Medication Accessibility:  Home  Pharmacy: [facility]   Was the patient provided with refills on discharged medications? yes   Have all prescriptions been transferred from [facility] to home pharmacy? yes   Medication Review: APIXABAN (ELIQUIS)  Apixaban 5 mg BID initiated on 10/11/21.  - Discussed importance of taking medication around the same time everyday  - Reviewed potential DDIs  with patient  - Advised patient of medications to avoid (NSAIDs, ASA)  - Educated that Tylenol (acetaminophen) will be the preferred analgesic to prevent risk of bleeding  - Emphasized importance of monitoring for signs and symptoms of bleeding (abnormal bruising, prolonged bleeding, nose bleeds, bleeding from gums, discolored urine, black tarry stools)  - Advised patient to alert all providers of anticoagulation therapy prior to starting a new medication or having a procedure   Follow-up Appointments:   Date Visit Type Length Department   11/05/2021 10:30 AM OFFICE VISIT 15 min [facility] [[id number]]  Patient Instructions:   Please arrive 15 minutes prior to your appointment. This will allow [place] to verify and update your medical record and ensure a full appointment for you within the time allotted.         11/16/2021  1:00 PM LAB MO 15 min [facility] [[id number]]  Patient Instructions:            11/16/2021  1:30 PM EST PT 30 30 min [facility] [[id number]]  Patient Instructions:     Final Patient Assessment: She has been doing well without any complaints. Patient does not report any problems affording her medication and it was all transferred out to her home pharmacy. Re-emphasized the importance of daily adherence and signs/symptoms of bleeding.    Lind Guest, PharmD Clinical Pharmacy Resident  [facility] 11/01/2021 2:52 PM

## 2021-11-02 LAB — MISC LABCORP TEST (SEND OUT): Labcorp test code: 9985

## 2021-11-05 ENCOUNTER — Ambulatory Visit (INDEPENDENT_AMBULATORY_CARE_PROVIDER_SITE_OTHER): Payer: Medicare Other | Admitting: Pulmonary Disease

## 2021-11-05 ENCOUNTER — Encounter: Payer: Self-pay | Admitting: Pulmonary Disease

## 2021-11-05 VITALS — BP 124/72 | HR 104 | Ht 62.0 in | Wt 162.0 lb

## 2021-11-05 DIAGNOSIS — K220 Achalasia of cardia: Secondary | ICD-10-CM | POA: Diagnosis not present

## 2021-11-05 DIAGNOSIS — J849 Interstitial pulmonary disease, unspecified: Secondary | ICD-10-CM | POA: Diagnosis not present

## 2021-11-05 DIAGNOSIS — J189 Pneumonia, unspecified organism: Secondary | ICD-10-CM

## 2021-11-05 NOTE — Progress Notes (Unsigned)
Synopsis: Referred in April 2023 for abnormal CT chest findings by Brunetta Genera, MD  Subjective:   PATIENT ID: Meghan Alexander GENDER: female DOB: June 24, 1955, MRN: [id number]  HPI  Chief Complaint  Patient presents with   Follow-up    6 wk f/u for ILD. States her breathing has improved since last visit. Still on 21m of prednisone.    Meghan Henlyis a 66year old woman, never smoker with allergies, achalasia, GERD, hypertension, DM II and follicular lymphoma who returns to pulmonary clinic for pneumonitis.   Autoimmune lab panel was unremarkable at last visit. She has been tapered down to 182mof prednisone daily. Her breathing has significantly improved. She is doing well since her right ankle surgery.   OV 09/24/21 She was started on 4088mrednisone daily on 4/19 which was reduced to 58m57mily on 5/2 prior to her ankle surgery on 09/03/21. She was admitted 5/15 to 5/17 for acute hypoxemic respiratory failure. She had an increase in bilateral opacities on CTA Chest scan 5/15. She was seen by inpatient pulmonary team with concern for peri-operative aspiration vs acute infiltrates from covid 19 infection on chronic interstitial findings. She was discharged on home O2 as she desatted with ambulation.  She has not been using oxygen over the past couple of weeks. She resumed 40mg33mprednisone daily and is taking bactrim 3 days per week. She denies cough, joint pains or fevers. She continues to have night sweats. She reports her achalasia symptoms have significantly improved since taking steroids.   Initial OV 08/11/21 She was admitted 4/10 to 4/17 for acute respiratory failure with diffuse ground glass opacities noted on her CT Chest scan. She had progressive cough, night sweats, chills and dyspnea. She completed 3 courses of outpatient antibiotics without improvement. She underwent bronchoscopy with BAL of the RML and transbronchial brushings of the RLL on 4/11. BAL cell count was  predominantly lymphocytic. Pathology did not indicate malignant cells on brushings. There was report of concern for actinomyces on path report. BAL culture grew strep mitis. Fungtitell and quantiferon gold are negative. ID was consulted. Patient was treated with ceftriaxone and azithromycin and placed on augmentin for 14 day course and treated with high dose IV steroids and discharged on 5 days of prednisone 40mg 51my. ESR was 75 on 08/02/21  She reports noticing improvement in her breathing after receiving the IV steroids. She did not require supplemental oxygen at time of discharge.   Upon chart review, she received her rituximab infusion on 02/25/21 and was seen again in Oncology clinic 05/11/21 where it was noted she had bronchitis and treated for pneumonia since the infusion. There is no chest imaging for review from late 2022 or early 2023.    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) 1[place]/2020   GERD (gastroesophageal reflux disease)    hx of reflux-went away with elevating HOB    Hypertension    Pneumonia    hx of    PONV (postoperative nausea and vomiting)    Type 2 diabetes mellitus (HCC) 4[place]/2023     Family History  Problem Relation Age of Onset   Lung cancer Father        1982 died from it  Hernia Mother    Breast cancer Maternal Aunt        multiple    Breast cancer Maternal Grandmother    Breast cancer Paternal Grandmother    Breast cancer Cousin        cousins multiple    Colon cancer Neg Hx    Colon polyps Neg Hx    Rectal cancer Neg Hx    Stomach cancer Neg Hx      Social History   Socioeconomic History   Marital status: Married    Spouse name: Not on file   Number of children: Not on file   Years of education: Not on file   Highest education  level: Not on file  Occupational History   Not on file  Tobacco Use   Smoking status: Never   Smokeless tobacco: Never  Vaping Use   Vaping Use: Never used  Substance and Sexual Activity   Alcohol use: No    Alcohol/week: 0.0 standard drinks of alcohol   Drug use: No   Sexual activity: Not on file  Other Topics Concern   Not on file  Social History Narrative   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 [employer]   Never smoker never drinker   No drugs   Multiple allergies   Lives at home with her husband of 8 years since 2008   Social Determinants of Health   Financial Resource Strain: Not on file  Food Insecurity: Not on file  Transportation Needs: Not on file  Physical Activity: Not on file  Stress: Not on file  Social Connections: Not on file  Intimate Partner Violence: Not on file     Allergies  Allergen Reactions   Latex Other (See Comments)    Blisters / skin peeling   Codeine Nausea And Vomiting and Other (See Comments)    hallucinations   Atenolol Cough   Other Other (See Comments)    DermaPlast; Used after surgery - caused swelling, itching, and wound broke open   Hydrocodone Nausea And Vomiting   Oxycodone Nausea And Vomiting     Outpatient Medications Prior to Visit  Medication Sig Dispense Refill   Accu-Chek Softclix Lancets lancets Use to test blood sugars up to 4 times daily as directed. 100 each 0   acetaminophen (TYLENOL) 325 MG tablet Take 2 tablets (650 mg total) by mouth every 8 (eight) hours as needed. 90 tablet 0   acyclovir (ZOVIRAX) 400 MG tablet TAKE 1 TABLET(400 MG) BY MOUTH DAILY (Patient taking differently: Take 400 mg by mouth daily.) 30 tablet 6   apixaban (ELIQUIS) 5 MG TABS tablet Take 1 tablet (5 mg total) by mouth 2 (two) times daily. 60 tablet 3   Blood Glucose Monitoring Suppl (BLOOD GLUCOSE MONITOR SYSTEM) w/Device KIT Use to test blood sugars up to 4 times daily. 1 kit 0   diltiazem (CARDIZEM CD) 240 MG  24 hr capsule Take 1 capsule (240 mg total) by mouth daily. 30 capsule 0   diphenhydrAMINE (BENADRYL) 25 MG tablet Take 25 mg by mouth at bedtime.     feeding supplement (ENSURE ENLIVE / ENSURE PLUS) LIQD Take 237 mLs by mouth 3 (three) times daily between meals. (Patient taking differently: Take 237 mLs by mouth 2 (two) times daily between meals.) 237 mL 12   ferrous sulfate 325 (65 FE) MG tablet Take 1 tablet (325 mg total) by mouth every other day. 30 tablet 0   glucose blood (ACCU-CHEK GUIDE) test strip  Use to test blood sugars up to 4 times daily as directed. 100 each 0   HUMALOG KWIKPEN 100 UNIT/ML KwikPen Inject 5 Units into the skin with breakfast, with lunch, and with evening meal. 15 mL 11   insulin detemir (LEVEMIR) 100 UNIT/ML FlexPen Inject 10 Units into the skin daily. 15 mL 0   Insulin Pen Needle 32G X 4 MM MISC Use to inject insulin up to 4 times daily as directed. 100 each 0   losartan (COZAAR) 50 MG tablet Take 50 mg by mouth daily.     Melatonin 10 MG CAPS Take 10 mg by mouth at bedtime.      metFORMIN (GLUCOPHAGE) 500 MG tablet Take 500 mg by mouth daily with breakfast.     metoprolol succinate (TOPROL-XL) 25 MG 24 hr tablet Take 1 tablet (25 mg total) by mouth daily. 30 tablet 0   Multiple Vitamin (MULTIVITAMIN) tablet Take 1 tablet by mouth daily.     Niacin (VITAMIN B-3 PO) Take 1 tablet by mouth daily.     omeprazole (PRILOSEC) 20 MG capsule Take 20 mg by mouth daily.     predniSONE (DELTASONE) 10 MG tablet Take 1 tablet (10 mg total) by mouth daily with breakfast. 30 tablet 2   rosuvastatin (CRESTOR) 10 MG tablet Take 10 mg by mouth daily.     venlafaxine XR (EFFEXOR-XR) 37.5 MG 24 hr capsule Take 37.5 mg by mouth daily with breakfast.     albuterol (VENTOLIN HFA) 108 (90 Base) MCG/ACT inhaler Inhale 2 puffs into the lungs daily as needed for shortness of breath.     fluticasone (FLONASE) 50 MCG/ACT nasal spray Place 2 sprays into both nostrils daily.     ipratropium  (ATROVENT) 0.03 % nasal spray Place 1 spray into both nostrils daily.     Ipratropium-Albuterol (COMBIVENT) 20-100 MCG/ACT AERS respimat Inhale 1 puff into the lungs every 6 (six) hours as needed for wheezing. 4 g 0   No facility-administered medications prior to visit.   Review of Systems  Constitutional:  Negative for chills, fever, malaise/fatigue and weight loss.  HENT:  Negative for congestion, sinus pain and sore throat.   Eyes: Negative.   Respiratory:  Negative for cough, hemoptysis, sputum production, shortness of breath and wheezing.   Cardiovascular:  Negative for chest pain, palpitations, orthopnea, claudication and leg swelling.  Gastrointestinal:  Negative for abdominal pain, heartburn, nausea and vomiting.  Genitourinary: Negative.   Musculoskeletal:  Negative for joint pain and myalgias.  Skin:  Negative for rash.  Neurological:  Negative for weakness.  Endo/Heme/Allergies: Negative.   Psychiatric/Behavioral: Negative.      Objective:   Vitals:   11/05/21 1025  BP: 124/72  Pulse: (!) 104  SpO2: 98%  Weight: 162 lb (73.5 kg)  Height: _0  (1.575 m)    Physical Exam Constitutional:      General: She is not in acute distress.    Appearance: She is not ill-appearing.  HENT:     Head: Normocephalic and atraumatic.  Eyes:     General: No scleral icterus.    Conjunctiva/sclera: Conjunctivae normal.  Cardiovascular:     Rate and Rhythm: Normal rate and regular rhythm.     Pulses: Normal pulses.     Heart sounds: Normal heart sounds. No murmur heard. Pulmonary:     Effort: Pulmonary effort is normal.     Breath sounds: Normal breath sounds. No wheezing, rhonchi or rales.  Musculoskeletal:     Right lower leg: No  edema.     Left lower leg: No edema.  Skin:    General: Skin is warm and dry.  Neurological:     General: No focal deficit present.     Mental Status: She is alert.  Psychiatric:        Mood and Affect: Mood normal.        Behavior: Behavior  normal.        Thought Content: Thought content normal.        Judgment: Judgment normal.    CBC    Component Value Date/Time   WBC 7.3 10/11/2021 0432   RBC 4.19 10/11/2021 0432   HGB 10.5 (L) 10/11/2021 0432   HGB 12.6 05/11/2021 1005   HCT 32.9 (L) 10/11/2021 0432   PLT 242 10/11/2021 0432   PLT 148 (L) 05/11/2021 1005   MCV 78.5 (L) 10/11/2021 0432   MCH 25.1 (L) 10/11/2021 0432   MCHC 31.9 10/11/2021 0432   RDW 16.5 (H) 10/11/2021 0432   LYMPHSABS 0.2 (L) 09/06/2021 0933   MONOABS 0.4 09/06/2021 0933   EOSABS 0.0 09/06/2021 0933   BASOSABS 0.0 09/06/2021 0933      Latest Ref Rng & Units 10/11/2021    4:32 AM 10/10/2021    3:38 AM 10/09/2021    1:57 AM  BMP  Glucose 70 - 99 mg/dL 207  141  131   BUN 8 - 23 mg/dL _0 Creatinine 0.44 - 1.00 mg/dL 0.88  0.84  0.94   Sodium 135 - 145 mmol/L 138  139  134   Potassium 3.5 - 5.1 mmol/L 3.9  3.9  3.7   Chloride 98 - 111 mmol/L 100  100  99   CO2 22 - 32 mmol/L _1 Calcium 8.9 - 10.3 mg/dL 9.2  9.2  8.9    Chest imaging: CTA Chest 09/06/21 Mediastinum/Nodes: No enlarged mediastinal, hilar, or axillary lymph nodes. Thyroid gland, and trachea demonstrate no significant findings. As before, again seen is the moderately large fluid-filled dilatation of the esophagus consistent with history of achalasia.   Lungs/Pleura: There are confluent patchy opacities seen throughout the lungs and are more prominent in the upper lobes and have significantly progressed in the interim. Mild bibasilar pleural thickening and was present on the previous study as well.  CXR 09/06/21 Patchy ill-defined nodular appearing areas are noted throughout the lungs bilaterally. In addition, there is more diffuse airspace consolidation throughout the left mid to lower lung. No definite pleural effusions. No pneumothorax. No evidence of pulmonary edema. Heart size is normal. Upper mediastinal contours are within normal limits.  Atherosclerotic calcifications are noted in the thoracic aorta.  CT 08/02/21 No definite evidence of pulmonary embolus. Continued presence patchy airspace opacities throughout both lungs which may be slightly more prominent compared to prior exam, concerning for multifocal pneumonia. Hepatic steatosis. Stable dilated esophagus is noted consistent with history of achalasia. Aortic Atherosclerosis  CT 07/14/21 Mediastinum/Nodes: No discrete thyroid nodule. No pathologically enlarged mediastinal, hilar or axillary lymph nodes. Patulous thoracic esophagus with retained versus refluxed contrast material in the esophagus and mild diffuse esophageal wall thickening.   Lungs/Pleura: Extensive multifocal bilateral ground-glass opacities with interstitial thickening, favored to reflect an infectious or inflammatory etiology. No pleural effusion. No pneumothorax.  PFT:     No data to display          Labs:  Path:  Echo:  Heart Catheterization:    Assessment & Plan:  ILD (interstitial lung disease) ([place]) - Plan: CT CHEST HIGH RESOLUTION  Pneumonitis  Achalasia  Discussion: Meghan Alexander is a 66 year old woman, never smoker with allergies, achalasia, GERD, hypertension, DM II and follicular lymphoma who returns to pulmonary clinic for hospital follow up after acute respiratory failure.   Her recent hospitalization may have been due to covid 19 pneumonia on top of pneumonitis from rituximab vs flare in disease with drop in steroid or aspiration event peri-operatively.   She is near completion of a prolonged steroid taper which will end on 7/17. We will check a CT Chest scan in 3 months for monitoring of resolution.  Her achalasia symptoms have improved and she has canceled her corrective surgery plans.   Follow up in 3 months  Freda Jackson, MD [facility] Office: [phone]      Current Outpatient Medications:    Accu-Chek Softclix  Lancets lancets, Use to test blood sugars up to 4 times daily as directed., Disp: 100 each, Rfl: 0   acetaminophen (TYLENOL) 325 MG tablet, Take 2 tablets (650 mg total) by mouth every 8 (eight) hours as needed., Disp: 90 tablet, Rfl: 0   acyclovir (ZOVIRAX) 400 MG tablet, TAKE 1 TABLET(400 MG) BY MOUTH DAILY (Patient taking differently: Take 400 mg by mouth daily.), Disp: 30 tablet, Rfl: 6   apixaban (ELIQUIS) 5 MG TABS tablet, Take 1 tablet (5 mg total) by mouth 2 (two) times daily., Disp: 60 tablet, Rfl: 3   Blood Glucose Monitoring Suppl (BLOOD GLUCOSE MONITOR SYSTEM) w/Device KIT, Use to test blood sugars up to 4 times daily., Disp: 1 kit, Rfl: 0   diltiazem (CARDIZEM CD) 240 MG 24 hr capsule, Take 1 capsule (240 mg total) by mouth daily., Disp: 30 capsule, Rfl: 0   diphenhydrAMINE (BENADRYL) 25 MG tablet, Take 25 mg by mouth at bedtime., Disp: , Rfl:    feeding supplement (ENSURE ENLIVE / ENSURE PLUS) LIQD, Take 237 mLs by mouth 3 (three) times daily between meals. (Patient taking differently: Take 237 mLs by mouth 2 (two) times daily between meals.), Disp: 237 mL, Rfl: 12   ferrous sulfate 325 (65 FE) MG tablet, Take 1 tablet (325 mg total) by mouth every other day., Disp: 30 tablet, Rfl: 0   glucose blood (ACCU-CHEK GUIDE) test strip, Use to test blood sugars up to 4 times daily as directed., Disp: 100 each, Rfl: 0   HUMALOG KWIKPEN 100 UNIT/ML KwikPen, Inject 5 Units into the skin with breakfast, with lunch, and with evening meal., Disp: 15 mL, Rfl: 11   insulin detemir (LEVEMIR) 100 UNIT/ML FlexPen, Inject 10 Units into the skin daily., Disp: 15 mL, Rfl: 0   Insulin Pen Needle 32G X 4 MM MISC, Use to inject insulin up to 4 times daily as directed., Disp: 100 each, Rfl: 0   losartan (COZAAR) 50 MG tablet, Take 50 mg by mouth daily., Disp: , Rfl:    Melatonin 10 MG CAPS, Take 10 mg by mouth at bedtime. , Disp: , Rfl:    metFORMIN (GLUCOPHAGE) 500 MG tablet, Take 500 mg by mouth daily with  breakfast., Disp: , Rfl:    metoprolol succinate (TOPROL-XL) 25 MG 24 hr tablet, Take 1 tablet (25 mg total) by mouth daily., Disp: 30 tablet, Rfl: 0   Multiple Vitamin (MULTIVITAMIN) tablet, Take 1 tablet by mouth daily., Disp: , Rfl:    Niacin (VITAMIN B-3 PO), Take 1 tablet by mouth daily., Disp: , Rfl:    omeprazole (  PRILOSEC) 20 MG capsule, Take 20 mg by mouth daily., Disp: , Rfl:    predniSONE (DELTASONE) 10 MG tablet, Take 1 tablet (10 mg total) by mouth daily with breakfast., Disp: 30 tablet, Rfl: 2   rosuvastatin (CRESTOR) 10 MG tablet, Take 10 mg by mouth daily., Disp: , Rfl:    venlafaxine XR (EFFEXOR-XR) 37.5 MG 24 hr capsule, Take 37.5 mg by mouth daily with breakfast., Disp: , Rfl:

## 2021-11-05 NOTE — Patient Instructions (Addendum)
Continue '10mg'$  prednisone daily through 7/17.  Stop bactrim antibiotic  We will check a CT Chest scan in 3 months  Follow up in 3 months after CT Chest scan

## 2021-11-06 ENCOUNTER — Encounter: Payer: Self-pay | Admitting: Pulmonary Disease

## 2021-11-15 ENCOUNTER — Other Ambulatory Visit: Payer: Self-pay

## 2021-11-16 ENCOUNTER — Inpatient Hospital Stay: Payer: Medicare Other | Attending: Hematology | Admitting: Hematology

## 2021-11-16 ENCOUNTER — Other Ambulatory Visit: Payer: Self-pay

## 2021-11-16 ENCOUNTER — Inpatient Hospital Stay: Payer: Medicare Other

## 2021-11-16 VITALS — BP 140/89 | HR 96 | Temp 98.3°F | Resp 20 | Wt 168.8 lb

## 2021-11-16 DIAGNOSIS — Z8379 Family history of other diseases of the digestive system: Secondary | ICD-10-CM | POA: Insufficient documentation

## 2021-11-16 DIAGNOSIS — Z803 Family history of malignant neoplasm of breast: Secondary | ICD-10-CM | POA: Diagnosis not present

## 2021-11-16 DIAGNOSIS — Z801 Family history of malignant neoplasm of trachea, bronchus and lung: Secondary | ICD-10-CM | POA: Diagnosis not present

## 2021-11-16 DIAGNOSIS — E119 Type 2 diabetes mellitus without complications: Secondary | ICD-10-CM | POA: Insufficient documentation

## 2021-11-16 DIAGNOSIS — Z79899 Other long term (current) drug therapy: Secondary | ICD-10-CM | POA: Diagnosis not present

## 2021-11-16 DIAGNOSIS — I10 Essential (primary) hypertension: Secondary | ICD-10-CM | POA: Insufficient documentation

## 2021-11-16 DIAGNOSIS — C8218 Follicular lymphoma grade II, lymph nodes of multiple sites: Secondary | ICD-10-CM

## 2021-11-16 LAB — CBC WITH DIFFERENTIAL (CANCER CENTER ONLY)
Abs Immature Granulocytes: 0.15 10*3/uL — ABNORMAL HIGH (ref 0.00–0.07)
Basophils Absolute: 0 10*3/uL (ref 0.0–0.1)
Basophils Relative: 1 %
Eosinophils Absolute: 0.1 10*3/uL (ref 0.0–0.5)
Eosinophils Relative: 1 %
HCT: 35.3 % — ABNORMAL LOW (ref 36.0–46.0)
Hemoglobin: 11.4 g/dL — ABNORMAL LOW (ref 12.0–15.0)
Immature Granulocytes: 2 %
Lymphocytes Relative: 6 %
Lymphs Abs: 0.4 10*3/uL — ABNORMAL LOW (ref 0.7–4.0)
MCH: 25.9 pg — ABNORMAL LOW (ref 26.0–34.0)
MCHC: 32.3 g/dL (ref 30.0–36.0)
MCV: 80.2 fL (ref 80.0–100.0)
Monocytes Absolute: 1 10*3/uL (ref 0.1–1.0)
Monocytes Relative: 13 %
Neutro Abs: 5.9 10*3/uL (ref 1.7–7.7)
Neutrophils Relative %: 77 %
Platelet Count: 250 10*3/uL (ref 150–400)
RBC: 4.4 MIL/uL (ref 3.87–5.11)
RDW: 16.4 % — ABNORMAL HIGH (ref 11.5–15.5)
WBC Count: 7.6 10*3/uL (ref 4.0–10.5)
nRBC: 0 % (ref 0.0–0.2)

## 2021-11-16 LAB — LACTATE DEHYDROGENASE: LDH: 219 U/L — ABNORMAL HIGH (ref 98–192)

## 2021-11-16 LAB — CMP (CANCER CENTER ONLY)
ALT: 16 U/L (ref 0–44)
AST: 15 U/L (ref 15–41)
Albumin: 4 g/dL (ref 3.5–5.0)
Alkaline Phosphatase: 86 U/L (ref 38–126)
Anion gap: 9 (ref 5–15)
BUN: 12 mg/dL (ref 8–23)
CO2: 29 mmol/L (ref 22–32)
Calcium: 9.5 mg/dL (ref 8.9–10.3)
Chloride: 102 mmol/L (ref 98–111)
Creatinine: 0.76 mg/dL (ref 0.44–1.00)
GFR, Estimated: >60 mL/min (ref >60)
Glucose, Bld: 143 mg/dL — ABNORMAL HIGH (ref 70–99)
Potassium: 3.5 mmol/L (ref 3.5–5.1)
Sodium: 140 mmol/L (ref 135–145)
Total Bilirubin: 0.4 mg/dL (ref 0.3–1.2)
Total Protein: 6.9 g/dL (ref 6.5–8.1)

## 2021-11-19 ENCOUNTER — Telehealth: Payer: Self-pay | Admitting: Hematology

## 2021-11-19 NOTE — Telephone Encounter (Signed)
Left message with follow-up appointment per 7/25 los.

## 2021-11-20 ENCOUNTER — Other Ambulatory Visit: Payer: Self-pay

## 2021-11-22 ENCOUNTER — Encounter: Payer: Self-pay | Admitting: Hematology

## 2021-11-22 NOTE — Progress Notes (Signed)
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   INTERVAL HISTORY  [name] is a 66 y.o. 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  She notes that she has been following with pulmonology for continued evaluation and management of her pulmonary infiltrates.  She notes that she is being weaned off steroids and is currently off oxygen with no acute shortness of breath at this time. She was also in the hospital in June for uncontrolled diabetes. She continues to follow with Dr. Erin Fulling and pulmonary for management of her interstitial lung disease. No fevers no chills no night sweats no unexpected weight loss. Patient notes no new lumps or bumps. Labs done today were reviewed in detail with the patient.   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 ([place]) 04/02/2019   GERD (gastroesophageal reflux disease)    hx of reflux-went away with elevating HOB    Hypertension    Pneumonia    hx of    PONV (postoperative nausea and vomiting)    Type 2 diabetes mellitus ([place]) 08/02/2021    SURGICAL  HISTORY: Past Surgical History:  Procedure Laterality Date   ABDOMINAL HYSTERECTOMY     ADENOIDECTOMY     BRONCHIAL BRUSHINGS  08/03/2021   Procedure: BRONCHIAL BRUSHINGS;  Surgeon: Collene Gobble, MD;  Location: [facility];  Service: Cardiopulmonary;;   BRONCHIAL WASHINGS  08/03/2021   Procedure: BRONCHIAL WASHINGS;  Surgeon: Collene Gobble, MD;  Location: [facility];  Service: Cardiopulmonary;;   cosmetic surgeries     ESOPHAGEAL MANOMETRY N/A 12/02/2015   Procedure: ESOPHAGEAL MANOMETRY (EM);  Surgeon: [name], MD;  Location: [facility];  Service: Endoscopy;  Laterality: N/A;   ESOPHAGOGASTRODUODENOSCOPY N/A 12/02/2015   Procedure: ESOPHAGOGASTRODUODENOSCOPY (EGD);  Surgeon: [name], MD;  Location: [facility];  Service: Endoscopy;  Laterality: N/A;   lymph node removal left leg     cat scratch surgery    ORIF ANKLE FRACTURE Right 09/03/2021   Procedure: OPEN REDUCTION INTERNAL FIXATION (ORIF) ANKLE FRACTURE;  Surgeon: Willaim Sheng, MD;  Location: [place];  Service: Orthopedics;  Laterality: Right;   THROAT SURGERY     sleep apnea surgery    TONSILLECTOMY     VIDEO BRONCHOSCOPY  08/03/2021   Procedure: VIDEO BRONCHOSCOPY WITH FLUORO;  Surgeon: Collene Gobble, MD;  Location: [facility];  Service: Cardiopulmonary;;    SOCIAL HISTORY: Social History   Socioeconomic History   Marital status: Married    Spouse name: Not on file   Number of children: Not on file   Years of education: Not on  file   Highest education level: Not on file  Occupational History   Not on file  Tobacco Use   Smoking status: Never   Smokeless tobacco: Never  Vaping Use   Vaping Use: Never used  Substance and Sexual Activity   Alcohol use: No    Alcohol/week: 0.0 standard drinks of alcohol   Drug use: No   Sexual activity: Not on file  Other Topics Concern   Not on file  Social History Narrative   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 [employer]    Never smoker never drinker   No drugs   Multiple allergies   Lives at home with her husband of 8 years since 2008   Social Determinants of Health   Financial Resource Strain: Not on file  Food Insecurity: Not on file  Transportation Needs: Not on file  Physical Activity: Not on file  Stress: Not on file  Social Connections: Not on file  Intimate Partner Violence: Not on file    FAMILY HISTORY: Family History  Problem Relation Age of Onset   Lung cancer Father        1982 died from it   Hernia Mother    Breast cancer Maternal Aunt        multiple    Breast cancer Maternal Grandmother    Breast cancer Paternal Grandmother    Breast cancer Cousin        cousins multiple    Colon cancer Neg Hx    Colon polyps Neg Hx    Rectal cancer Neg Hx    Stomach cancer Neg Hx     ALLERGIES:  is allergic to latex, codeine, atenolol, other, hydrocodone, and oxycodone.  MEDICATIONS:  Current Outpatient Medications  Medication Sig Dispense Refill   Accu-Chek Softclix Lancets lancets Use to test blood sugars up to 4 times daily as directed. 100 each 0   acyclovir (ZOVIRAX) 400 MG tablet TAKE 1 TABLET(400 MG) BY MOUTH DAILY (Patient taking differently: Take 400 mg by mouth daily.) 30 tablet 6   apixaban (ELIQUIS) 5 MG TABS tablet Take 1 tablet (5 mg total) by mouth 2 (two) times daily. 60 tablet 3   Blood Glucose Monitoring Suppl (BLOOD GLUCOSE MONITOR SYSTEM) w/Device KIT Use to test blood sugars up to 4 times daily. 1 kit 0   diltiazem (CARDIZEM CD) 240 MG 24 hr capsule Take 1 capsule (240 mg total) by mouth daily. 30 capsule 0   diphenhydrAMINE (BENADRYL) 25 MG tablet Take 25 mg by mouth at bedtime.     feeding supplement (ENSURE ENLIVE / ENSURE PLUS) LIQD Take 237 mLs by mouth 3 (three) times daily between meals. (Patient taking differently: Take 237 mLs by mouth 2 (two) times daily between meals.) 237 mL 12   ferrous sulfate 325 (65 FE) MG tablet Take 1 tablet (325 mg total) by mouth  every other day. 30 tablet 0   glucose blood (ACCU-CHEK GUIDE) test strip Use to test blood sugars up to 4 times daily as directed. 100 each 0   HUMALOG KWIKPEN 100 UNIT/ML KwikPen Inject 5 Units into the skin with breakfast, with lunch, and with evening meal. 15 mL 11   insulin detemir (LEVEMIR) 100 UNIT/ML FlexPen Inject 10 Units into the skin daily. 15 mL 0   Insulin Pen Needle 32G X 4 MM MISC Use to inject insulin up to 4 times daily as directed. 100 each 0   losartan (COZAAR) 50 MG tablet Take 50 mg  by mouth daily.     Melatonin 10 MG CAPS Take 10 mg by mouth at bedtime.      metFORMIN (GLUCOPHAGE) 500 MG tablet Take 500 mg by mouth daily with breakfast.     metoprolol succinate (TOPROL-XL) 25 MG 24 hr tablet Take 1 tablet (25 mg total) by mouth daily. 30 tablet 0   Multiple Vitamin (MULTIVITAMIN) tablet Take 1 tablet by mouth daily.     Niacin (VITAMIN B-3 PO) Take 1 tablet by mouth daily.     omeprazole (PRILOSEC) 20 MG capsule Take 20 mg by mouth daily.     predniSONE (DELTASONE) 10 MG tablet Take 1 tablet (10 mg total) by mouth daily with breakfast. (Patient not taking: Reported on 11/16/2021) 30 tablet 2   rosuvastatin (CRESTOR) 10 MG tablet Take 10 mg by mouth daily.     venlafaxine XR (EFFEXOR-XR) 37.5 MG 24 hr capsule Take 37.5 mg by mouth daily with breakfast.     No current facility-administered medications for this visit.    REVIEW OF SYSTEMS: Fevers no chills no night sweats Persistent nonproductive cough. Dyspnea on exertion present  PHYSICAL EXAMINATION:  Vitals:   11/16/21 1432  BP: 140/89  Pulse: 96  Resp: 20  Temp: 98.3 F (36.8 C)  SpO2: 94%   NAD GENERAL:alert, in no acute distress and comfortable SKIN: no acute rashes, no significant lesions EYES: conjunctiva are pink and non-injected, sclera anicteric NECK: supple, no JVD LYMPH:  no palpable lymphadenopathy in the cervical, axillary or inguinal regions LUNGS: clear to auscultation b/l with normal  respiratory effort HEART: regular rate & rhythm ABDOMEN:  normoactive bowel sounds , non tender, not distended. Extremity: no pedal edema PSYCH: alert & oriented x 3 with fluent speech NEURO: no focal motor/sensory deficits  LABORATORY DATA:  I have reviewed the data as listed     Latest Ref Rng & Units 11/16/2021    1:17 PM 10/11/2021    4:32 AM 10/10/2021    3:38 AM  CBC  WBC 4.0 - 10.5 K/uL 7.6  7.3  8.6   Hemoglobin 12.0 - 15.0 g/dL 11.4  10.5  10.3   Hematocrit 36.0 - 46.0 % 35.3  32.9  31.6   Platelets 150 - 400 K/uL 250  242  231        Latest Ref Rng & Units 11/16/2021    1:17 PM 10/11/2021    4:32 AM 10/10/2021    3:38 AM  CMP  Glucose 70 - 99 mg/dL 143  207  141   BUN 8 - 23 mg/dL $Remove'12  15  11   'zsLOseu$ Creatinine 0.44 - 1.00 mg/dL 0.76  0.88  0.84   Sodium 135 - 145 mmol/L 140  138  139   Potassium 3.5 - 5.1 mmol/L 3.5  3.9  3.9   Chloride 98 - 111 mmol/L 102  100  100   CO2 22 - 32 mmol/L $RemoveB'29  25  26   'fOdVOdpe$ Calcium 8.9 - 10.3 mg/dL 9.5  9.2  9.2   Total Protein 6.5 - 8.1 g/dL 6.9     Total Bilirubin 0.3 - 1.2 mg/dL 0.4     Alkaline Phos 38 - 126 U/L 86     AST 15 - 41 U/L 15     ALT 0 - 44 U/L 16      . Lab Results  Component Value Date   LDH 219 (H) 11/16/2021   05/14/2019 CT ANGIO CHEST PE W OR WO CONTRAST (Accession [id number]):  03/08/2019 Lymph Node Biopsy ([id number]):      RADIOGRAPHIC STUDIES: I have personally reviewed the radiological images as listed and agreed with the findings in the report. No results found.  ASSESSMENT & PLAN:   #1 Stage III/IV follicular lymphoma - likely low grade but accurate grading difficult  11/02/2018 MRI lumbar spine wo contrast revealed "Extensive retroperitoneal lymphadenopathy highly worrisome for neoplastic process such as lymphoproliferative disease. Spondylosis worst at L4-5 where there is narrowing in the right subarticular recess predominantly due to a synovial cyst off the medial margin of the right facet with  encroachment on the descending right L5 root. There is mild central canal narrowing overall at this Level."  12/04/2018 CT CAP revealed "Mild-to-moderate abdominal and pelvic lymphadenopathy and new 9 mm left superior mediastinal lymph node, highly suspicious for lymphoproliferative disorder. Stable 8 mm lingular pulmonary nodule, consistent with benign Etiology. Moderate hepatic steatosis. 1.3 cm indeterminate low-attenuation lesion in the right lobe. Recommend continued attention on follow-up CT."  12/18/2018 lymph node biopsy pathology revealing a follicular lymphoma- likely low grade but accurate grading not possible on limited sampling.  02/06/2019 PET/CT scan ([id number]) revealed "1. Bulky hypermetabolic lymphadenopathy in the abdomen and upper pelvis, as described. This is associated with mild hypermetabolic lymphadenopathy in the upper mediastinum and right axilla. Hypermetabolic lymphadenopathy represents a combination of Deauville 4 and Deauville 5 disease. 2. 8 mm nodule in the lingula without hypermetabolism. Attention on follow-up recommended. 3. Hepatic steatosis. 4.  Aortic Atherosclerois (ICD10-170.0)."  03/08/2019 Lymph Node Biopsy ([id number]) revealed "LYMPH NODE, NEEDLE CORE BIOPSY: - Follicular lymphoma (grade 1-2 of 3) with elevated proliferation rate."  2. Mild treatment related thrombocytopenia- resolved.  PLAN: Patient has no clinical or radiographic evidence of persistent follicular lymphoma at this time. Reviewed CT neck chest abdomen pelvis from 07/15/2021. Patient has completed Bendamustine Rituxan chemotherapy followed by 18 months of Rituxan maintenance with the last dose on 02/25/2021 We will hold off on further Rituxan maintenance at this time in the context of infections and uncontrolled diabetes and given good response to treatment and shared decision making and discussion with the patient.  3.  H/o Melena.  2 episodes about a month ago.  Hemoglobin stable  today. Previously had upper abdominal discomfort which has resolved. Has some chronic nausea due to achalasia. PLAN -Follows with Dr. Paulita Fujita gastroenterology-patient notes she is being scheduled for an EGD  and colonoscopy. -No contraindication for EGD or colonoscopy from oncology standpoint. Had been on aspirin which has been stopped. -Is on PPI twice daily.  4.  Chronic ALT elevation -likely from her fatty liver. Plan -Follow-up with PCP and GI for management of hepatic steatosis/steatohepatitis. -Avoid hepatotoxic medications. -Recommended optimal diet and exercise and to optimize control of diabetes with PCP.  5. DM2 -following with PCP  6.  Bilateral Extensive multifocal groundglass opacities with interstitial thickening./Interstitial lung disease following with Dr. Erin Fulling from pulmonary. Currently on steroid taper and is off home oxygen.  Plan -Patient's labs from today were discussed in detail with her. CBC CMP and LDH stable. -CT angiogram of the chest done on 10/08/2021 showed no evidence of acute pulmonary embolism recurrent bilateral groundglass and airspace opacities no adenopathy or pleural effusion. Patient notes that her steroids have helped her dysphagia from her Achalasia and she is having thoughts of delaying her surgery for this. Patient has no clinical symptoms suggestive of progression of her follicular lymphoma at this time. Continue following with pulmonology for management of her interstitial lung disease.   Return  to clinic with Dr. Irene Limbo with labs in 18 weeks  The total time spent in the appointment was 21 minutes*.  All of the patient's questions were answered with apparent satisfaction. The patient knows to call the clinic with any problems, questions or concerns.   Sullivan Lone MD MS AAHIVMS [facility] Hematology/Oncology Physician [facility]  .*Total Encounter Time as defined by the Centers for Medicare and Medicaid Services includes, in  addition to the face-to-face time of a patient visit (documented in the note above) non-face-to-face time: obtaining and reviewing outside history, ordering and reviewing medications, tests or procedures, care coordination (communications with other health care professionals or caregivers) and documentation in the medical record.

## 2021-11-23 ENCOUNTER — Other Ambulatory Visit: Payer: Self-pay

## 2021-12-09 ENCOUNTER — Other Ambulatory Visit: Payer: Self-pay | Admitting: Adult Health

## 2021-12-09 NOTE — Progress Notes (Signed)
Cardiology Clinic Note   Patient Name: Meghan Alexander Date of Encounter: 12/10/2021  Primary Care Provider:  [name], MD Primary Cardiologist:  Dr.Acharya   Patient Profile    66 year old female with known history of PAF on Eliquis, CHADS VASC Score of 4. achalasia with steroid therapy, HTN, CZ6,[id number] follicular lymphoma followed by Dr. Mancel Bale, hematologist,. 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 Bronchoscopy with BAL right middle lobe negative for malignancy.    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 ([place]) 04/02/2019   GERD (gastroesophageal reflux disease)    hx of reflux-went away with elevating HOB    Hypertension    Pneumonia    hx of    PONV (postoperative nausea and vomiting)    Type 2 diabetes mellitus ([place]) 08/02/2021   Past Surgical History:  Procedure Laterality Date   ABDOMINAL HYSTERECTOMY     ADENOIDECTOMY     BRONCHIAL BRUSHINGS  08/03/2021   Procedure: BRONCHIAL BRUSHINGS;  Surgeon: Collene Gobble, MD;  Location: [place];  Service: Cardiopulmonary;;   BRONCHIAL WASHINGS  08/03/2021   Procedure: BRONCHIAL WASHINGS;  Surgeon: Collene Gobble, MD;  Location: [facility];  Service: Cardiopulmonary;;   cosmetic surgeries     ESOPHAGEAL MANOMETRY N/A 12/02/2015   Procedure: ESOPHAGEAL MANOMETRY (EM);  Surgeon: [name], MD;  Location: [facility];  Service: Endoscopy;  Laterality: N/A;   ESOPHAGOGASTRODUODENOSCOPY N/A 12/02/2015   Procedure: ESOPHAGOGASTRODUODENOSCOPY (EGD);  Surgeon: [name], MD;  Location: [facility];  Service: Endoscopy;  Laterality: N/A;   lymph node removal left leg     cat scratch surgery    ORIF ANKLE FRACTURE  Right 09/03/2021   Procedure: OPEN REDUCTION INTERNAL FIXATION (ORIF) ANKLE FRACTURE;  Surgeon: Willaim Sheng, MD;  Location: [place];  Service: Orthopedics;  Laterality: Right;   THROAT SURGERY     sleep apnea surgery    TONSILLECTOMY     VIDEO BRONCHOSCOPY  08/03/2021   Procedure: VIDEO BRONCHOSCOPY WITH FLUORO;  Surgeon: Collene Gobble, MD;  Location: [facility];  Service: Cardiopulmonary;;    Allergies  Allergies  Allergen Reactions   Latex Other (See Comments)    Blisters / skin peeling   Codeine Nausea And Vomiting and Other (See Comments)    hallucinations   Atenolol Cough   Other Other (See Comments)    DermaPlast; Used after surgery - caused swelling, itching, and wound broke open   Hydrocodone Nausea And Vomiting   Oxycodone Nausea And Vomiting    History of Present Illness    Meghan Alexander returns today for 45-monthfollow-up in the setting of paroxysmal atrial fibrillation, currently on Eliquis, hypertension, hyperlipidemia.  Decision will be made today concerning need to continue Eliquis.   She comes today stating that she had a "bad night" as she was awakened twice by her glucose monitor due to hypoglycemia.  She also began to have some chills and rigors associated with it.  She was a febrile.  She got up and ate and then went back to bed.  And was awakened again by her blood glucose machine.  Today she feels lightheaded and dizzy.  No further complaints of chills.  Current  Outpatient Medications  Medication Sig Dispense Refill   Accu-Chek Softclix Lancets lancets Use to test blood sugars up to 4 times daily as directed. 100 each 0   aspirin EC 81 MG tablet Take 81 mg by mouth daily. Swallow whole.     Blood Glucose Monitoring Suppl (BLOOD GLUCOSE MONITOR SYSTEM) w/Device KIT Use to test blood sugars up to 4 times daily. 1 kit 0   diltiazem (CARDIZEM CD) 240 MG 24 hr capsule Take 1 capsule (240 mg total) by mouth daily. 30 capsule 0   diphenhydrAMINE (BENADRYL) 25  MG tablet Take 25 mg by mouth at bedtime.     feeding supplement (ENSURE ENLIVE / ENSURE PLUS) LIQD Take 237 mLs by mouth 3 (three) times daily between meals. (Patient taking differently: Take 237 mLs by mouth 2 (two) times daily between meals.) 237 mL 12   glucose blood (ACCU-CHEK GUIDE) test strip Use to test blood sugars up to 4 times daily as directed. 100 each 0   HUMALOG KWIKPEN 100 UNIT/ML KwikPen Inject 5 Units into the skin with breakfast, with lunch, and with evening meal. 15 mL 11   insulin detemir (LEVEMIR) 100 UNIT/ML FlexPen Inject 10 Units into the skin daily. 15 mL 0   Insulin Pen Needle 32G X 4 MM MISC Use to inject insulin up to 4 times daily as directed. 100 each 0   losartan (COZAAR) 50 MG tablet Take 25 mg by mouth daily. Take 1/2 Tablet Daily     Melatonin 10 MG CAPS Take 10 mg by mouth at bedtime.      metFORMIN (GLUCOPHAGE) 500 MG tablet Take 500 mg by mouth daily with breakfast.     metoprolol succinate (TOPROL-XL) 25 MG 24 hr tablet Take 1 tablet (25 mg total) by mouth daily. 30 tablet 0   Multiple Vitamin (MULTIVITAMIN) tablet Take 1 tablet by mouth daily.     Niacin (VITAMIN B-3 PO) Take 1 tablet by mouth daily.     omeprazole (PRILOSEC) 20 MG capsule Take 20 mg by mouth daily.     predniSONE (DELTASONE) 10 MG tablet Take 1 tablet (10 mg total) by mouth daily with breakfast. 30 tablet 2   rosuvastatin (CRESTOR) 10 MG tablet Take 10 mg by mouth daily.     venlafaxine XR (EFFEXOR-XR) 37.5 MG 24 hr capsule Take 37.5 mg by mouth daily with breakfast.     ferrous sulfate 325 (65 FE) MG tablet Take 1 tablet (325 mg total) by mouth every other day. 30 tablet 0   No current facility-administered medications for this visit.     Family History    Family History  Problem Relation Age of Onset   Lung cancer Father        1982 died from it   Hernia Mother    Breast cancer Maternal Aunt        multiple    Breast cancer Maternal Grandmother    Breast cancer Paternal  Grandmother    Breast cancer Cousin        cousins multiple    Colon cancer Neg Hx    Colon polyps Neg Hx    Rectal cancer Neg Hx    Stomach cancer Neg Hx    She indicated that the status of her mother is unknown. She indicated that the status of her father is unknown. She indicated that the status of her maternal grandmother is unknown. She indicated that the status of her paternal grandmother is unknown. She indicated that the  status of her maternal aunt is unknown. She indicated that the status of her cousin is unknown. She indicated that the status of her neg hx is unknown.  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  Cardiovascular:  No chest pain, dyspnea on exertion, edema, orthopnea, palpitations, paroxysmal nocturnal dyspnea. Dermatological: No rash, lesions/masses Respiratory: No cough,  dyspnea Urologic: No hematuria, dysuria Abdominal:   Positive for nausea, vomiting, diarrhea, bright red blood per rectum, melena, or hematemesis Neurologic:  No visual changes, wkns, changes in mental status. All other systems reviewed and are otherwise negative except as noted above.     Physical Exam    VS:  BP 115/62   Pulse 96   Ht _0  (1.575 m)   Wt 171 lb 6.4 oz (77.7 kg)   SpO2 97%   BMI 31.35 kg/m  , BMI Body mass index is 31.35 kg/m.     GEN: Well nourished, well developed, in no acute distress. HEENT: normal. Neck: Supple, no JVD, carotid bruits, or masses. Cardiac: RRR, no murmurs, rubs, or gallops. No clubbing, cyanosis, edema.  Radials/DP/PT 2+ and equal bilaterally.  Respiratory:  Respirations regular and unlabored, clear to auscultation bilaterally. GI: Soft, nontender, nondistended, BS + x 4. MS: no deformity or atrophy.  Right foot walking cast Skin: warm and dry, no rash. Neuro:  Strength and sensation are intact. Psych: Normal affect.  Accessory Clinical Findings    ECG personally reviewed by me today-normal sinus rhythm heart rate of 96 bpm- No acute changes  Lab Results  Component Value Date   WBC 10.4 12/09/2021   HGB 12.0 12/09/2021   HCT 37.1 12/09/2021   MCV 81 12/09/2021   PLT 325 12/09/2021   Lab Results  Component Value Date   CREATININE 0.89 12/09/2021   BUN 21 12/09/2021   NA 142 12/09/2021   K 4.2 12/09/2021   CL 98 12/09/2021   CO2 25 12/09/2021   Lab Results  Component Value Date   ALT 16 11/16/2021   AST 15 11/16/2021   ALKPHOS 86 11/16/2021   BILITOT 0.4 11/16/2021   Lab Results  Component Value Date   CHOL 178 08/08/2021  HDL 40 (L) 08/08/2021   LDLCALC 113 (H) 08/08/2021   TRIG 126 08/08/2021   CHOLHDL 4.5 08/08/2021    Lab Results  Component Value Date   HGBA1C 7.5 (H) 08/08/2021    Review of Prior Studies: Echo 10/08/2021  1. Left ventricular ejection fraction, by estimation, is 55 to 60%. The  left  ventricle has normal function. The left ventricle has no regional  wall motion abnormalities. Left ventricular diastolic parameters are  indeterminate.   2. Right ventricular systolic function is normal. The right ventricular  size is normal.   3. The mitral valve is abnormal. Trivial mitral valve regurgitation. No  evidence of mitral stenosis.   4. The aortic valve is tricuspid. Aortic valve regurgitation is not  visualized. No aortic stenosis is present.   5. The inferior vena cava is dilated in size with >50% respiratory  variability, suggesting right atrial pressure of 8 mmHg.   Assessment & Plan   1.  Paroxysmal atrial fibrillation: Has been persistently in normal sinus rhythm for over 1 month.  I feel safe in stopping Eliquis.  Heart rate is slightly elevated but on recheck apically heart rate was 78.  She replaced on aspirin 81 mg daily.  She is to report any recurrence of rapid heart rhythm, palpitations.  2.  Hypertension: She has been having lightheadedness and dizziness with some hypotension seen on her home blood pressure recordings.  On prior visits blood pressures have been in the 756-433 range systolic.  Today she is 115/62 and is feeling very fatigued and lightheaded.  I reviewed her labs that were completed on 12/09/2021.  Creatinine 0.89, potassium 4.2, most recent hemoglobin 11.4.  Will decrease losartan to 25 mg daily.  She is to keep up with her blood pressure at home.  If blood pressure is persistently greater than 140/80 she is to go back up to losartan 50 mg daily.  3.  Insulin-dependent diabetes: Has had 2 episodes during the night with blood glucose alarm awakening her due to hypoglycemia.  I have advised her to notify her endocrinologist ASAP concerning what occurred overnight.  Adjustments may need to be made concerning her insulin sliding scale and long-acting insulin.  4.  Hypercholesterolemia: Continues on rosuvastatin 10 mg daily.  Goal of LDL less than 70 with  cardiovascular risk factors to include hypertension, diabetes.    Current medicines are reviewed at length with the patient today.  I have spent 25 min's  dedicated to the care of this patient on the date of this encounter to include pre-visit review of records, assessment, management and diagnostic testing,with shared decision making.  Signed, Phill Myron. West Pugh, ANP, AACC   12/10/2021 1:30 PM      Office [phone] Fax [phone]  Notice: This dictation was prepared with Dragon dictation along with smaller phrase technology. Any transcriptional errors that result from this process are unintentional and may not be corrected upon review.

## 2021-12-10 ENCOUNTER — Encounter: Payer: Self-pay | Admitting: Hematology

## 2021-12-10 ENCOUNTER — Telehealth: Payer: Self-pay

## 2021-12-10 ENCOUNTER — Encounter: Payer: Self-pay | Admitting: Adult Health

## 2021-12-10 ENCOUNTER — Ambulatory Visit (INDEPENDENT_AMBULATORY_CARE_PROVIDER_SITE_OTHER): Payer: Medicare Other | Admitting: Adult Health

## 2021-12-10 VITALS — BP 115/62 | HR 96 | Ht 62.0 in | Wt 171.4 lb

## 2021-12-10 DIAGNOSIS — E7800 Pure hypercholesterolemia, unspecified: Secondary | ICD-10-CM

## 2021-12-10 DIAGNOSIS — I10 Essential (primary) hypertension: Secondary | ICD-10-CM

## 2021-12-10 DIAGNOSIS — I4891 Unspecified atrial fibrillation: Secondary | ICD-10-CM | POA: Diagnosis not present

## 2021-12-10 DIAGNOSIS — E1165 Type 2 diabetes mellitus with hyperglycemia: Secondary | ICD-10-CM | POA: Diagnosis not present

## 2021-12-10 LAB — BASIC METABOLIC PANEL
BUN/Creatinine Ratio: 24 (ref 12–28)
BUN: 21 mg/dL (ref 8–27)
CO2: 25 mmol/L (ref 20–29)
Calcium: 9.5 mg/dL (ref 8.7–10.3)
Chloride: 98 mmol/L (ref 96–106)
Creatinine, Ser: 0.89 mg/dL (ref 0.57–1.00)
Glucose: 198 mg/dL — ABNORMAL HIGH (ref 70–99)
Potassium: 4.2 mmol/L (ref 3.5–5.2)
Sodium: 142 mmol/L (ref 134–144)
eGFR: 71 mL/min/{1.73_m2} (ref >59)

## 2021-12-10 LAB — CBC
Hematocrit: 37.1 % (ref 34.0–46.6)
Hemoglobin: 12 g/dL (ref 11.1–15.9)
MCH: 26.2 pg — ABNORMAL LOW (ref 26.6–33.0)
MCHC: 32.3 g/dL (ref 31.5–35.7)
MCV: 81 fL (ref 79–97)
Platelets: 325 10*3/uL (ref 150–450)
RBC: 4.58 x10E6/uL (ref 3.77–5.28)
RDW: 16.7 % — ABNORMAL HIGH (ref 11.7–15.4)
WBC: 10.4 10*3/uL (ref 3.4–10.8)

## 2021-12-10 NOTE — Patient Instructions (Addendum)
Medication Instructions:  Stop Eliquis Start Aspirin 81 mg ( Take 1 Tablet Daily). Decrease Losartan to 25 mg ( Take 1 Tablet Daily). *If you need a refill on your cardiac medications before your next appointment, please call your pharmacy*   Lab Work: No Labs If you have labs (blood work) drawn today and your tests are completely normal, you will receive your results only by: [place] (if you have MyChart) OR A paper copy in the mail If you have any lab test that is abnormal or we need to change your treatment, we will call you to review the results.   Testing/Procedures: No Testing    Follow-Up: At [facility], you and your health needs are our priority.  As part of our continuing mission to provide you with exceptional heart care, we have created designated Provider Care Teams.  These Care Teams include your primary Cardiologist (physician) and Advanced Practice Providers (APPs -  Physician Assistants and Nurse Practitioners) who all work together to provide you with the care you need, when you need it.  We recommend signing up for the patient portal called "MyChart".  Sign up information is provided on this After Visit Summary.  MyChart is used to connect with patients for Virtual Visits (Telemedicine).  Patients are able to view lab/test results, encounter notes, upcoming appointments, etc.  Non-urgent messages can be sent to your provider as well.   To learn more about what you can do with MyChart, go to [email or web address].    Your next appointment:   1 month(s)  The format for your next appointment:   In Person  Provider:   Jory Sims, FNP  }    Other Instructions Please Call Endocrinologist  regarding Low Blood Sugar reading.  Important Information About Sugar

## 2021-12-10 NOTE — Telephone Encounter (Addendum)
Results review today in office with Meghan Alexander.----- Message from [name], NP sent at 12/10/2021  1:48 PM EDT ----- Reviewed with patient during office visit today.

## 2021-12-23 ENCOUNTER — Ambulatory Visit (INDEPENDENT_AMBULATORY_CARE_PROVIDER_SITE_OTHER): Payer: Medicare Other | Admitting: Pulmonary Disease

## 2021-12-23 ENCOUNTER — Ambulatory Visit (INDEPENDENT_AMBULATORY_CARE_PROVIDER_SITE_OTHER): Payer: Medicare Other

## 2021-12-23 ENCOUNTER — Encounter: Payer: Self-pay | Admitting: Pulmonary Disease

## 2021-12-23 VITALS — BP 120/66 | HR 104 | Temp 97.5°F | Ht 62.0 in | Wt 169.8 lb

## 2021-12-23 DIAGNOSIS — J189 Pneumonia, unspecified organism: Secondary | ICD-10-CM | POA: Diagnosis not present

## 2021-12-23 MED ORDER — PREDNISONE 10 MG PO TABS: 10.0000 mg | ORAL_TABLET | Freq: Every day | ORAL | 1 refills | Status: DC

## 2021-12-23 MED ORDER — PREDNISONE 5 MG PO TABS: 5.0000 mg | ORAL_TABLET | Freq: Every day | ORAL | 1 refills | Status: DC

## 2021-12-23 NOTE — Progress Notes (Signed)
Synopsis: Referred in April 2023 for abnormal CT chest findings by Brunetta Genera, MD  Subjective:   PATIENT ID: Meghan Alexander GENDER: female DOB: Jan 22, 1956, MRN: [id number]  HPI  Chief Complaint  Patient presents with   Follow-up    HRCT 10/3  , breathing has been awful lately. SOB with ADLs and any minor exertion. Minor coughing.   Meghan Alexander is a 66 year old woman, never smoker with allergies, achalasia, GERD, hypertension, DM II and follicular lymphoma who returns to pulmonary clinic for pneumonitis.   She reports increasing shortness of breath since completion of steroid taper on 11/08/21. She reports her O2 levels dropping into the 80s based on her home pulse oximeter. She has dyspnea with brushing her teeth or talking on the phone.   OV 11/05/21 Autoimmune lab panel was unremarkable at last visit. She has been tapered down to 21m of prednisone daily. Her breathing has significantly improved. She is doing well since her right ankle surgery.   OV 09/24/21 She was started on 47mprednisone daily on 4/19 which was reduced to 2059maily on 5/2 prior to her ankle surgery on 09/03/21. She was admitted 5/15 to 5/17 for acute hypoxemic respiratory failure. She had an increase in bilateral opacities on CTA Chest scan 5/15. She was seen by inpatient pulmonary team with concern for peri-operative aspiration vs acute infiltrates from covid 19 infection on chronic interstitial findings. She was discharged on home O2 as she desatted with ambulation.  She has not been using oxygen over the past couple of weeks. She resumed 59m70m prednisone daily and is taking bactrim 3 days per week. She denies cough, joint pains or fevers. She continues to have night sweats. She reports her achalasia symptoms have significantly improved since taking steroids.   Initial OV 08/11/21 She was admitted 4/10 to 4/17 for acute respiratory failure with diffuse ground glass opacities noted on her CT Chest scan.  She had progressive cough, night sweats, chills and dyspnea. She completed 3 courses of outpatient antibiotics without improvement. She underwent bronchoscopy with BAL of the RML and transbronchial brushings of the RLL on 4/11. BAL cell count was predominantly lymphocytic. Pathology did not indicate malignant cells on brushings. There was report of concern for actinomyces on path report. BAL culture grew strep mitis. Fungtitell and quantiferon gold are negative. ID was consulted. Patient was treated with ceftriaxone and azithromycin and placed on augmentin for 14 day course and treated with high dose IV steroids and discharged on 5 days of prednisone 59mg51mly. ESR was 75 on 08/02/21  She reports noticing improvement in her breathing after receiving the IV steroids. She did not require supplemental oxygen at time of discharge.   Upon chart review, she received her rituximab infusion on 02/25/21 and was seen again in Oncology clinic 05/11/21 where it was noted she had bronchitis and treated for pneumonia since the infusion. There is no chest imaging for review from late 2022 or early 2023.    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) [place]8/[place]   GERD (gastroesophageal reflux disease)    hx of reflux-went away with elevating HOB    Hypertension    Pneumonia    hx of    PONV (  postoperative nausea and vomiting)    Type 2 diabetes mellitus ([place]) 08/02/2021     Family History  Problem Relation Age of Onset   Lung cancer Father        06/26/80 died from it   Hernia Mother    Breast cancer Maternal Aunt        multiple    Breast cancer Maternal Grandmother    Breast cancer Paternal Grandmother    Breast cancer Cousin        cousins multiple    Colon cancer Neg Hx    Colon polyps Neg Hx     Rectal cancer Neg Hx    Stomach cancer Neg Hx      Social History   Socioeconomic History   Marital status: Married    Spouse name: Not on file   Number of children: Not on file   Years of education: Not on file   Highest education level: Not on file  Occupational History   Not on file  Tobacco Use   Smoking status: Never   Smokeless tobacco: Never  Vaping Use   Vaping Use: Never used  Substance and Sexual Activity   Alcohol use: No    Alcohol/week: 0.0 standard drinks of alcohol   Drug use: No   Sexual activity: Not on file  Other Topics Concern   Not on file  Social History Narrative   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 [employer]   Never smoker never drinker   No drugs   Multiple allergies   Lives at home with her husband of 8 years since 06-26-06   Social Determinants of Health   Financial Resource Strain: Not on file  Food Insecurity: Not on file  Transportation Needs: Not on file  Physical Activity: Not on file  Stress: Not on file  Social Connections: Not on file  Intimate Partner Violence: Not on file     Allergies  Allergen Reactions   Latex Other (See Comments)    Blisters / skin peeling   Codeine Nausea And Vomiting and Other (See Comments)    hallucinations   Atenolol Cough   Other Other (See Comments)    DermaPlast; Used after surgery - caused swelling, itching, and wound broke open   Hydrocodone Nausea And Vomiting   Oxycodone Nausea And Vomiting     Outpatient Medications Prior to Visit  Medication Sig Dispense Refill   Accu-Chek Softclix Lancets lancets Use to test blood sugars up to 4 times daily as directed. 100 each 0   aspirin EC 81 MG tablet Take 81 mg by mouth daily. Swallow whole.     Blood Glucose Monitoring Suppl (BLOOD GLUCOSE MONITOR SYSTEM) w/Device KIT Use to test blood sugars up to 4 times daily. 1 kit 0   diltiazem (CARDIZEM CD) 240 MG 24 hr capsule Take 1 capsule (240 mg total) by mouth daily.  30 capsule 0   diphenhydrAMINE (BENADRYL) 25 MG tablet Take 25 mg by mouth at bedtime.     feeding supplement (ENSURE ENLIVE / ENSURE PLUS) LIQD Take 237 mLs by mouth 3 (three) times daily between meals. (Patient taking differently: Take 237 mLs by mouth 2 (two) times daily between meals.) 237 mL 12   glucose blood (ACCU-CHEK GUIDE) test strip Use to test blood sugars up to 4 times daily as directed. 100 each 0   HUMALOG KWIKPEN 100 UNIT/ML KwikPen Inject 5 Units into the skin with breakfast, with lunch, and with evening meal.  15 mL 11   insulin detemir (LEVEMIR) 100 UNIT/ML FlexPen Inject 10 Units into the skin daily. 15 mL 0   Insulin Pen Needle 32G X 4 MM MISC Use to inject insulin up to 4 times daily as directed. 100 each 0   losartan (COZAAR) 50 MG tablet Take 25 mg by mouth daily. Take 1/2 Tablet Daily     Melatonin 10 MG CAPS Take 10 mg by mouth at bedtime.      metFORMIN (GLUCOPHAGE) 500 MG tablet Take 500 mg by mouth daily with breakfast.     metoprolol succinate (TOPROL-XL) 25 MG 24 hr tablet Take 1 tablet (25 mg total) by mouth daily. 30 tablet 0   Multiple Vitamin (MULTIVITAMIN) tablet Take 1 tablet by mouth daily.     Niacin (VITAMIN B-3 PO) Take 1 tablet by mouth daily.     omeprazole (PRILOSEC) 20 MG capsule Take 20 mg by mouth daily.     rosuvastatin (CRESTOR) 10 MG tablet Take 10 mg by mouth daily.     venlafaxine XR (EFFEXOR-XR) 37.5 MG 24 hr capsule Take 37.5 mg by mouth daily with breakfast.     ferrous sulfate 325 (65 FE) MG tablet Take 1 tablet (325 mg total) by mouth every other day. 30 tablet 0   predniSONE (DELTASONE) 10 MG tablet Take 1 tablet (10 mg total) by mouth daily with breakfast. 30 tablet 2   No facility-administered medications prior to visit.   Review of Systems  Constitutional:  Negative for chills, fever, malaise/fatigue and weight loss.  HENT:  Negative for congestion, sinus pain and sore throat.   Eyes: Negative.   Respiratory:  Positive for cough and  shortness of breath. Negative for hemoptysis, sputum production and wheezing.   Cardiovascular:  Negative for chest pain, palpitations, orthopnea, claudication and leg swelling.  Gastrointestinal:  Negative for abdominal pain, heartburn, nausea and vomiting.  Genitourinary: Negative.   Musculoskeletal:  Negative for joint pain and myalgias.  Skin:  Negative for rash.  Neurological:  Negative for weakness.  Endo/Heme/Allergies: Negative.   Psychiatric/Behavioral: Negative.      Objective:   Vitals:   12/23/21 0919  BP: 120/66  Pulse: (!) 104  Temp: (!) 97.5 F (36.4 C)  TempSrc: Oral  SpO2: 98%  Weight: 169 lb 12.8 oz (77 kg)  Height: _0  (1.575 m)    Physical Exam Constitutional:      General: She is not in acute distress.    Appearance: She is not ill-appearing.  HENT:     Head: Normocephalic and atraumatic.  Eyes:     General: No scleral icterus.    Conjunctiva/sclera: Conjunctivae normal.  Cardiovascular:     Rate and Rhythm: Normal rate and regular rhythm.     Pulses: Normal pulses.     Heart sounds: Normal heart sounds. No murmur heard. Pulmonary:     Effort: Pulmonary effort is normal.     Breath sounds: Normal breath sounds. Decreased air movement present. No wheezing, rhonchi or rales.  Musculoskeletal:     Right lower leg: No edema.     Left lower leg: No edema.  Skin:    General: Skin is warm and dry.  Neurological:     General: No focal deficit present.     Mental Status: She is alert.  Psychiatric:        Mood and Affect: Mood normal.        Behavior: Behavior normal.        Thought Content:  Thought content normal.        Judgment: Judgment normal.    CBC    Component Value Date/Time   WBC 10.4 12/09/2021 1339   WBC 7.6 11/16/2021 1317   WBC 7.3 10/11/2021 0432   RBC 4.58 12/09/2021 1339   RBC 4.40 11/16/2021 1317   HGB 12.0 12/09/2021 1339   HCT 37.1 12/09/2021 1339   PLT 325 12/09/2021 1339   MCV 81 12/09/2021 1339   MCH 26.2 (L)  12/09/2021 1339   MCH 25.9 (L) 11/16/2021 1317   MCHC 32.3 12/09/2021 1339   MCHC 32.3 11/16/2021 1317   RDW 16.7 (H) 12/09/2021 1339   LYMPHSABS 0.4 (L) 11/16/2021 1317   MONOABS 1.0 11/16/2021 1317   EOSABS 0.1 11/16/2021 1317   BASOSABS 0.0 11/16/2021 1317      Latest Ref Rng & Units 12/09/2021    1:39 PM 11/16/2021    1:17 PM 10/11/2021    4:32 AM  BMP  Glucose 70 - 99 mg/dL 198  143  207   BUN 8 - 27 mg/dL _0 Creatinine 0.57 - 1.00 mg/dL 0.89  0.76  0.88   BUN/Creat Ratio 12 - 28 24     Sodium 134 - 144 mmol/L 142  140  138   Potassium 3.5 - 5.2 mmol/L 4.2  3.5  3.9   Chloride 96 - 106 mmol/L 98  102  100   CO2 20 - 29 mmol/L _1 Calcium 8.7 - 10.3 mg/dL 9.5  9.5  9.2    Chest imaging: CXR 12/23/21 Interval improvement in right-greater-than-left airspace opacities.  CTA Chest 10/08/21 1. No evidence of acute pulmonary embolism or other acute vascular findings in the chest. 2. Recurrent/fluctuating bilateral ground-glass and airspace opacities, overall improved compared with prior CT of 1 month ago, but worsened from radiographs of 2 weeks ago. Differential considerations include atypical infection, non infectious inflammation, drug reaction and atypical edema. Given the fluctuation, lymphomatous involvement less likely. Radiographic follow up recommended. 3. No adenopathy or pleural effusion. 4. Dilated esophagus with air-fluid levels consistent with achalasia.  CTA Chest 09/06/21 Mediastinum/Nodes: No enlarged mediastinal, hilar, or axillary lymph nodes. Thyroid gland, and trachea demonstrate no significant findings. As before, again seen is the moderately large fluid-filled dilatation of the esophagus consistent with history of achalasia.   Lungs/Pleura: There are confluent patchy opacities seen throughout the lungs and are more prominent in the upper lobes and have significantly progressed in the interim. Mild bibasilar pleural thickening and  was present on the previous study as well.  CXR 09/06/21 Patchy ill-defined nodular appearing areas are noted throughout the lungs bilaterally. In addition, there is more diffuse airspace consolidation throughout the left mid to lower lung. No definite pleural effusions. No pneumothorax. No evidence of pulmonary edema. Heart size is normal. Upper mediastinal contours are within normal limits. Atherosclerotic calcifications are noted in the thoracic aorta.  CT 08/02/21 No definite evidence of pulmonary embolus. Continued presence patchy airspace opacities throughout both lungs which may be slightly more prominent compared to prior exam, concerning for multifocal pneumonia. Hepatic steatosis. Stable dilated esophagus is noted consistent with history of achalasia. Aortic Atherosclerosis  CT 07/14/21 Mediastinum/Nodes: No discrete thyroid nodule. No pathologically enlarged mediastinal, hilar or axillary lymph nodes. Patulous thoracic esophagus with retained versus refluxed contrast material in the esophagus and mild diffuse esophageal wall thickening.   Lungs/Pleura: Extensive multifocal bilateral ground-glass opacities with interstitial thickening, favored to reflect  an infectious or inflammatory etiology. No pleural effusion. No pneumothorax.  PFT:     No data to display          Labs:  Path:  Echo:  Heart Catheterization:    Assessment & Plan:   Pneumonitis - Plan: DG Chest 2 View, Pulse oximetry, overnight  Discussion: Meghan Alexander is a 66 year old woman, never smoker with allergies, achalasia, GERD, hypertension, DM II and follicular lymphoma who returns to pulmonary clinic for pneumonitis.   She completed steroid taper for pneumonitis on 7/17 and has developed increasing dyspnea since. She does not have oxygen desaturations on simple walk in clinic. Her chest radiograph shows interval improvement in the bilateral opacities.   We will check overnight oximetry  test.   We will restart a prednisone taper:  2m daily for 2 weeks (8/31 - 9/14) 15 daily for 2 weeks (9/15 - 9/29) 118mdaily for 2 weeks (9/30 - 10/14) 39m63maily for 2 weeks (10/15 to 10/29) 2.39mg24mily for 2 weeks (10/30 - 11/13)  Follow up in 6 weeks.  JonaFreda Jackson [facility]: [phone] Outpatient Medications:    Accu-Chek Softclix Lancets lancets, Use to test blood sugars up to 4 times daily as directed., Disp: 100 each, Rfl: 0   aspirin EC 81 MG tablet, Take 81 mg by mouth daily. Swallow whole., Disp: , Rfl:    Blood Glucose Monitoring Suppl (BLOOD GLUCOSE MONITOR SYSTEM) w/Device KIT, Use to test blood sugars up to 4 times daily., Disp: 1 kit, Rfl: 0   diltiazem (CARDIZEM CD) 240 MG 24 hr capsule, Take 1 capsule (240 mg total) by mouth daily., Disp: 30 capsule, Rfl: 0   diphenhydrAMINE (BENADRYL) 25 MG tablet, Take 25 mg by mouth at bedtime., Disp: , Rfl:    feeding supplement (ENSURE ENLIVE / ENSURE PLUS) LIQD, Take 237 mLs by mouth 3 (three) times daily between meals. (Patient taking differently: Take 237 mLs by mouth 2 (two) times daily between meals.), Disp: 237 mL, Rfl: 12   glucose blood (ACCU-CHEK GUIDE) test strip, Use to test blood sugars up to 4 times daily as directed., Disp: 100 each, Rfl: 0   HUMALOG KWIKPEN 100 UNIT/ML KwikPen, Inject 5 Units into the skin with breakfast, with lunch, and with evening meal., Disp: 15 mL, Rfl: 11   insulin detemir (LEVEMIR) 100 UNIT/ML FlexPen, Inject 10 Units into the skin daily., Disp: 15 mL, Rfl: 0   Insulin Pen Needle 32G X 4 MM MISC, Use to inject insulin up to 4 times daily as directed., Disp: 100 each, Rfl: 0   losartan (COZAAR) 50 MG tablet, Take 25 mg by mouth daily. Take 1/2 Tablet Daily, Disp: , Rfl:    Melatonin 10 MG CAPS, Take 10 mg by mouth at bedtime. , Disp: , Rfl:    metFORMIN (GLUCOPHAGE) 500 MG tablet, Take 500 mg by mouth daily with breakfast., Disp: , Rfl:     metoprolol succinate (TOPROL-XL) 25 MG 24 hr tablet, Take 1 tablet (25 mg total) by mouth daily., Disp: 30 tablet, Rfl: 0   Multiple Vitamin (MULTIVITAMIN) tablet, Take 1 tablet by mouth daily., Disp: , Rfl:    Niacin (VITAMIN B-3 PO), Take 1 tablet by mouth daily., Disp: , Rfl:    omeprazole (PRILOSEC) 20 MG capsule, Take 20 mg by mouth daily., Disp: , Rfl:    rosuvastatin (CRESTOR) 10 MG tablet, Take 10 mg by mouth daily., Disp: , Rfl:  venlafaxine XR (EFFEXOR-XR) 37.5 MG 24 hr capsule, Take 37.5 mg by mouth daily with breakfast., Disp: , Rfl:    ferrous sulfate 325 (65 FE) MG tablet, Take 1 tablet (325 mg total) by mouth every other day., Disp: 30 tablet, Rfl: 0

## 2021-12-23 NOTE — Patient Instructions (Addendum)
We will restart a prednisone taper:  '20mg'$  daily for 2 weeks (8/31 - 9/14) 15 daily for 2 weeks (9/15 - 9/29) '10mg'$  daily for 2 weeks (9/30 - 10/14) '5mg'$  daily for 2 weeks (10/15 to 10/29) 2.'5mg'$  daily for 2 weeks (10/30 - 11/13)  We will check a simple walk today and an overnight oximetry test  We will check a chest x-ray today  Follow up as scheduled on October 23

## 2021-12-24 ENCOUNTER — Other Ambulatory Visit: Payer: Self-pay

## 2021-12-24 ENCOUNTER — Telehealth: Payer: Self-pay | Admitting: Pulmonary Disease

## 2021-12-24 NOTE — Telephone Encounter (Signed)
Waiting on provider to ask if he wants this on room air or with oxygen.

## 2021-12-24 NOTE — Telephone Encounter (Signed)
JD placed order himself yesterday for ono on pt - order needs instructions - on room air, on cpap or on O2.  Liborio Nixon is showing he is off until 9/6 and the nurse who worked with him is not here today.  Will route to triage for corrected order.

## 2021-12-30 NOTE — Telephone Encounter (Signed)
Ordered corrected to room air

## 2021-12-30 NOTE — Addendum Note (Signed)
Addended by: Freda Jackson on: 12/30/2021 03:39 PM   Modules accepted: Orders

## 2021-12-30 NOTE — Telephone Encounter (Signed)
Was the ONO needed on room air or Oxygen and if so how many liters?  Please advise.

## 2022-01-11 ENCOUNTER — Telehealth: Payer: Self-pay | Admitting: Pulmonary Disease

## 2022-01-11 NOTE — Telephone Encounter (Signed)
Patient is calling to inform the nurse or doctor that she still has not gotten a call regarding her sleep study machine.  She has an office appt. In October to follow up on the study, but she has not gotten the machine yet.  Please call patient to discuss at [phone]

## 2022-01-11 NOTE — Telephone Encounter (Signed)
Called and spoke to patient and gave her [employer] number to call and speak with them about ONO. Nothing further needed

## 2022-01-13 NOTE — Progress Notes (Signed)
Cardiology Clinic Note   Patient Name: Meghan Alexander Date of Encounter: 01/14/2022  Primary Care Provider:  [name], MD Primary Cardiologist:  Dr. Margaretann Alexander   Patient Profile    66 year old female with known history of PAF on Eliquis, CHADS VASC Score of 4. achalasia with steroid therapy, HTN, EX5,[id number] follicular lymphoma followed by Dr. Mancel Alexander, hematologist,. 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 Bronchoscopy with BAL right middle lobe negative for malignancy.   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 ([place]) 04/02/2019   GERD (gastroesophageal reflux disease)    hx of reflux-went away with elevating HOB    Hypertension    Pneumonia    hx of    PONV (postoperative nausea and vomiting)    Type 2 diabetes mellitus ([place]) 08/02/2021   Past Surgical History:  Procedure Laterality Date   ABDOMINAL HYSTERECTOMY     ADENOIDECTOMY     BRONCHIAL BRUSHINGS  08/03/2021   Procedure: BRONCHIAL BRUSHINGS;  Surgeon: Meghan Gobble, MD;  Location: [facility];  Service: Cardiopulmonary;;   BRONCHIAL WASHINGS  08/03/2021   Procedure: BRONCHIAL WASHINGS;  Surgeon: Meghan Gobble, MD;  Location: [facility];  Service: Cardiopulmonary;;   cosmetic surgeries     ESOPHAGEAL MANOMETRY N/A 12/02/2015   Procedure: ESOPHAGEAL MANOMETRY (EM);  Surgeon: Meghan Silence, MD;  Location: [facility];  Service: Endoscopy;  Laterality: N/A;   ESOPHAGOGASTRODUODENOSCOPY N/A 12/02/2015   Procedure: ESOPHAGOGASTRODUODENOSCOPY (EGD);  Surgeon: Meghan Silence, MD;  Location: [facility];  Service: Endoscopy;  Laterality: N/A;   lymph node removal left leg     cat scratch surgery    ORIF ANKLE FRACTURE Right  09/03/2021   Procedure: OPEN REDUCTION INTERNAL FIXATION (ORIF) ANKLE FRACTURE;  Surgeon: Meghan Sheng, MD;  Location: [facility];  Service: Orthopedics;  Laterality: Right;   THROAT SURGERY     sleep apnea surgery    TONSILLECTOMY     VIDEO BRONCHOSCOPY  08/03/2021   Procedure: VIDEO BRONCHOSCOPY WITH FLUORO;  Surgeon: Meghan Gobble, MD;  Location: [facility];  Service: Cardiopulmonary;;    Allergies  Allergies  Allergen Reactions   Latex Other (See Comments)    Blisters / skin peeling   Codeine Nausea And Vomiting and Other (See Comments)    hallucinations   Atenolol Cough   Other Other (See Comments)    DermaPlast; Used after surgery - caused swelling, itching, and wound broke open   Hydrocodone Nausea And Vomiting   Oxycodone Nausea And Vomiting    History of Present Illness    Mrs. Else presents today for  ongoing assessment and management of hypertension, decrease losartan to 25 mg daily on last visit.Marland Kitchen She was to keep up with her blood pressure at home. If blood pressure is persistently greater than 140/80 she is to go back up to losartan 50 mg daily; PAF consistently in NSR and therefore Eliquis was discontinued.   [name] plans to have knee surgery by Dr.Daniel Zachery Alexander, in November 2023.  She will require a preoperative evaluation which is also being completed today.  She offers no cardiac complaints.  Blood pressures been well controlled on lower dose of losartan.  Her main complaints  are musculoskeletal to include bilateral knee pain, and chronic back pain.  She remains on low-dose steroid therapy.  She is medically compliant.  Home Medications    Current Outpatient Medications  Medication Sig Dispense Refill   Accu-Chek Softclix Lancets lancets Use to test blood sugars up to 4 times daily as directed. 100 each 0   aspirin EC 81 MG tablet Take 81 mg by mouth daily. Swallow whole.     Blood Glucose Monitoring Suppl (BLOOD GLUCOSE MONITOR SYSTEM) w/Device  KIT Use to test blood sugars up to 4 times daily. 1 kit 0   diltiazem (CARDIZEM CD) 240 MG 24 hr capsule Take 1 capsule (240 mg total) by mouth daily. 30 capsule 0   diphenhydrAMINE (BENADRYL) 25 MG tablet Take 25 mg by mouth at bedtime.     feeding supplement (ENSURE ENLIVE / ENSURE PLUS) LIQD Take 237 mLs by mouth 3 (three) times daily between meals. (Patient taking differently: Take 237 mLs by mouth 2 (two) times daily between meals.) 237 mL 12   fluticasone (FLONASE) 50 MCG/ACT nasal spray Place 2 sprays into both nostrils daily.     glucose blood (ACCU-CHEK GUIDE) test strip Use to test blood sugars up to 4 times daily as directed. 100 each 0   HUMALOG KWIKPEN 100 UNIT/ML KwikPen Inject 5 Units into the skin with breakfast, with lunch, and with evening meal. 15 mL 11   insulin detemir (LEVEMIR) 100 UNIT/ML FlexPen Inject 10 Units into the skin daily. 15 mL 0   Insulin Pen Needle 32G X 4 MM MISC Use to inject insulin up to 4 times daily as directed. 100 each 0   losartan (COZAAR) 50 MG tablet Take 25 mg by mouth daily. Take 1/2 Tablet Daily     Melatonin 10 MG CAPS Take 10 mg by mouth at bedtime.      metFORMIN (GLUCOPHAGE) 500 MG tablet Take 500 mg by mouth daily with breakfast.     metoprolol succinate (TOPROL-XL) 25 MG 24 hr tablet Take 1 tablet (25 mg total) by mouth daily. 30 tablet 0   Multiple Vitamin (MULTIVITAMIN) tablet Take 1 tablet by mouth daily.     Niacin (VITAMIN B-3 PO) Take 1 tablet by mouth daily.     omeprazole (PRILOSEC) 20 MG capsule Take 20 mg by mouth daily.     predniSONE (DELTASONE) 10 MG tablet Take 1 tablet (10 mg total) by mouth daily with breakfast. 80 tablet 1   predniSONE (DELTASONE) 5 MG tablet Take 1 tablet (5 mg total) by mouth daily with breakfast. 80 tablet 1   rosuvastatin (CRESTOR) 10 MG tablet Take 10 mg by mouth daily.     venlafaxine XR (EFFEXOR-XR) 37.5 MG 24 hr capsule Take 37.5 mg by mouth daily with breakfast.     ferrous sulfate 325 (65 FE) MG  tablet Take 1 tablet (325 mg total) by mouth every other day. 30 tablet 0   No current facility-administered medications for this visit.     Family History    Family History  Problem Relation Age of Onset   Lung cancer Father        1982 died from it   Hernia Mother    Breast cancer Maternal Aunt        multiple    Breast cancer Maternal Grandmother    Breast cancer Paternal Grandmother    Breast cancer Cousin        cousins multiple    Colon cancer Neg Hx  Colon polyps Neg Hx    Rectal cancer Neg Hx    Stomach cancer Neg Hx    She indicated that the status of her mother is unknown. She indicated that the status of her father is unknown. She indicated that the status of her maternal grandmother is unknown. She indicated that the status of her paternal grandmother is unknown. She indicated that the status of her maternal aunt is unknown. She indicated that the status of her cousin is unknown. She indicated that the status of her neg hx is unknown.  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  Cardiovascular:  No chest pain, dyspnea on exertion, edema, orthopnea, palpitations, paroxysmal nocturnal dyspnea. Dermatological: No rash, lesions/masses Respiratory: No cough, dyspnea Urologic: No hematuria, dysuria Abdominal:   No nausea, vomiting, diarrhea, bright red blood per rectum, melena, or hematemesis Neurologic:  No visual changes, wkns, changes in mental status. All other systems reviewed and are otherwise negative except as noted above.     Physical Exam    VS:  BP 136/72 (BP Location: Left Arm, Patient Position: Sitting, Cuff Size: Large)   Pulse 78   Ht _0  (1.575 m)   Wt 174 lb 6.4 oz (79.1 kg)   SpO2 97%   BMI 31.90 kg/m  , BMI Body mass index is 31.9 kg/m.     GEN: Well nourished, well developed, in no acute distress. HEENT: normal. Neck: Supple, no JVD, carotid bruits, or masses. Cardiac: RRR, no murmurs, rubs, or gallops. No clubbing, cyanosis, edema.  Radials/DP/PT 2+ and equal bilaterally.  Respiratory:  Respirations regular and unlabored, clear to auscultation bilaterally. GI: Soft, nontender, nondistended, BS + x 4. MS: no deformity or atrophy.  Some pain with range of motion of knees, but not significantly so. Skin: warm and dry, no rash. Neuro:  Strength and sensation are intact. Psych: Normal affect.  Accessory Clinical Findings    ECG personally reviewed by me today-sinus rhythm with first-degree AV block, PR interval 0.21 ms heart rate of 79 bpm- No acute changes  Lab Results  Component Value Date   WBC 10.4 12/09/2021   HGB 12.0 12/09/2021   HCT 37.1 12/09/2021  MCV 81 12/09/2021   PLT 325 12/09/2021   Lab Results  Component Value Date   CREATININE 0.89 12/09/2021   BUN 21 12/09/2021   NA 142 12/09/2021   K 4.2 12/09/2021   CL 98 12/09/2021   CO2  25 12/09/2021   Lab Results  Component Value Date   ALT 16 11/16/2021   AST 15 11/16/2021   ALKPHOS 86 11/16/2021   BILITOT 0.4 11/16/2021   Lab Results  Component Value Date   CHOL 178 08/08/2021   HDL 40 (L) 08/08/2021   LDLCALC 113 (H) 08/08/2021   TRIG 126 08/08/2021   CHOLHDL 4.5 08/08/2021    Lab Results  Component Value Date   HGBA1C 7.5 (H) 08/08/2021    Review of Prior Studies: Echo 10/08/2021  1. Left ventricular ejection fraction, by estimation, is 55 to 60%. The  left ventricle has normal function. The left ventricle has no regional  wall motion abnormalities. Left ventricular diastolic parameters are  indeterminate.   2. Right ventricular systolic function is normal. The right ventricular  size is normal.   3. The mitral valve is abnormal. Trivial mitral valve regurgitation. No  evidence of mitral stenosis.   4. The aortic valve is tricuspid. Aortic valve regurgitation is not  visualized. No aortic stenosis is present.   5. The inferior vena cava is dilated in size with >50% respiratory  variability, suggesting right atrial pressure of 8 mmHg.   Assessment & Plan   1.  Preoperative cardiac evaluation:  Chart reviewed as part of pre-operative protocol coverage. Given past medical history and time since last visit, based on ACC/AHA guidelines, Ellerie Arenz would be at acceptable risk for the planned procedure without further cardiovascular testing.  We will send note to surgeon at [facility] orthopedics.  2.  PAF: She is no longer on Eliquis as she has been in normal sinus rhythm for greater than 1 year.  She was requesting that we stop aspirin as this is causing significant stomach upset, despite enteric-coated version.  We will stop this today.   3.  Hypertension: Well-controlled on lower dose of losartan 25 mg daily.  Tolerating this well and has had no need to take extra doses due to hypertension.  She states her blood pressure is normally in the 120s  over 60s at home.  No changes in her medication regimen  4.  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  Being followed by pulmonology on chronic steroid dosing with tapering.  Current medicines are reviewed at length with the patient today.  I have spent 25 min's  dedicated to the care of this patient on the date of this encounter to include pre-visit review of records, assessment, management and diagnostic testing,with shared decision making. Signed, Phill Myron. West Pugh, ANP, [place]   01/14/2022 12:27 PM      Office [phone] Fax [phone]  Notice: This dictation was prepared with Dragon dictation along with smaller phrase technology. Any transcriptional errors that result from this process are unintentional and may not be corrected upon review.

## 2022-01-14 ENCOUNTER — Encounter: Payer: Self-pay | Admitting: Adult Health

## 2022-01-14 ENCOUNTER — Ambulatory Visit: Payer: Medicare Other | Attending: Adult Health | Admitting: Adult Health

## 2022-01-14 VITALS — BP 136/72 | HR 78 | Ht 62.0 in | Wt 174.4 lb

## 2022-01-14 DIAGNOSIS — Z01818 Encounter for other preprocedural examination: Secondary | ICD-10-CM | POA: Diagnosis not present

## 2022-01-14 DIAGNOSIS — Z01810 Encounter for preprocedural cardiovascular examination: Secondary | ICD-10-CM | POA: Diagnosis not present

## 2022-01-14 DIAGNOSIS — I480 Paroxysmal atrial fibrillation: Secondary | ICD-10-CM

## 2022-01-14 DIAGNOSIS — I10 Essential (primary) hypertension: Secondary | ICD-10-CM

## 2022-01-14 NOTE — Patient Instructions (Signed)
Medication Instructions:  No Changes *If you need a refill on your cardiac medications before your next appointment, please call your pharmacy*   Lab Work: No Labs If you have labs (blood work) drawn today and your tests are completely normal, you will receive your results only by: [place] (if you have MyChart) OR A paper copy in the mail If you have any lab test that is abnormal or we need to change your treatment, we will call you to review the results.   Testing/Procedures: No Testing   Follow-Up: At [facility], you and your health needs are our priority.  As part of our continuing mission to provide you with exceptional heart care, we have created designated Provider Care Teams.  These Care Teams include your primary Cardiologist (physician) and Advanced Practice Providers (APPs -  Physician Assistants and Nurse Practitioners) who all work together to provide you with the care you need, when you need it.  We recommend signing up for the patient portal called "MyChart".  Sign up information is provided on this After Visit Summary.  MyChart is used to connect with patients for Virtual Visits (Telemedicine).  Patients are able to view lab/test results, encounter notes, upcoming appointments, etc.  Non-urgent messages can be sent to your provider as well.   To learn more about what you can do with MyChart, go to [email or web address].    Your next appointment:   6 month(s)  The format for your next appointment:   In Person  Provider:   Jory Sims, FNP,DNP

## 2022-01-20 ENCOUNTER — Telehealth: Payer: Self-pay | Admitting: Pulmonary Disease

## 2022-01-20 NOTE — Telephone Encounter (Signed)
ONO Results  Please let patient know she does not need to use supplemental oxygen at night based on her recent ONO test.  Thanks, JD

## 2022-01-24 ENCOUNTER — Telehealth: Payer: Self-pay | Admitting: *Deleted

## 2022-01-24 NOTE — Telephone Encounter (Signed)
Called and spoke with patient. She verbalized understanding.   Nothing further needed at time of call.  

## 2022-01-24 NOTE — Telephone Encounter (Signed)
   Pre-operative Risk Assessment    Patient Name: Meghan Alexander  DOB: [birth year] MRN: [id number]      Request for Surgical Clearance    Procedure:   RT TOTAL KNEE REPLACEMENT  Date of Surgery:  Clearance TBD                                 Surgeon:  Charlies Constable, MD Surgeon's Group or Practice Name:  [facility] Phone number:  [phone] Fax number:  [phone]   Type of Clearance Requested:   - Medical  - Pharmacy:  Hold Aspirin NOT INDICATED HOW LONG   Type of Anesthesia:  Spinal   Additional requests/questions:    Astrid Divine   01/24/2022, 4:02 PM

## 2022-01-25 ENCOUNTER — Encounter (HOSPITAL_COMMUNITY): Payer: Self-pay

## 2022-01-25 ENCOUNTER — Ambulatory Visit (HOSPITAL_COMMUNITY)
Admission: RE | Admit: 2022-01-25 | Discharge: 2022-01-25 | Disposition: A | Discharge status: Home / Self Care | Payer: Medicare Other | Source: Ambulatory Visit | Attending: Internal Medicine | Admitting: Internal Medicine

## 2022-01-25 DIAGNOSIS — J849 Interstitial pulmonary disease, unspecified: Secondary | ICD-10-CM | POA: Diagnosis not present

## 2022-01-25 NOTE — Telephone Encounter (Addendum)
   Primary Cardiologist: Elouise Munroe, MD  Chart reviewed as part of pre-operative protocol coverage. Given past medical history and time since last visit, based on ACC/AHA guidelines, Sonam Wandel would be at acceptable risk for the planned procedure without further cardiovascular testing. She had an in-person evaluation with Jory Sims, NP on 01/14/22 at which time aspirin was discontinued due to GI distress.   I will route this recommendation to the requesting party via Epic fax function and remove from pre-op pool.  Please call with questions.  Emmaline Life, NP-C  01/25/2022, 7:36 AM [address] Office [phone] Fax [phone]

## 2022-01-26 ENCOUNTER — Telehealth: Payer: Self-pay | Admitting: Pulmonary Disease

## 2022-01-26 NOTE — Telephone Encounter (Signed)
Have clearance form in folder. But have not had a chance to look at new request for surgical clearance. Will look at folder on Friday. Nothing further needed

## 2022-01-27 ENCOUNTER — Telehealth: Payer: Self-pay | Admitting: Pulmonary Disease

## 2022-01-27 NOTE — Telephone Encounter (Signed)
Please let patient know that her CT chest scan shows resolution of the scattered areas of inflammation but there is minimal bandlike scarring or atelectasis (reversible lung collapse) in the left middle and lower lobe. She does have air trapping which is concerning for air trapping. I would recommend we start her on advair diskus 250-41mg 1 puff twice a day to see if this helps with the small airways disease and her breathing symptoms. If she is ok with this, please send in prescription and she is to rinse her mouth out after each use.  Thanks, JD

## 2022-01-28 ENCOUNTER — Telehealth: Payer: Self-pay | Admitting: Pulmonary Disease

## 2022-01-28 NOTE — Telephone Encounter (Signed)
Fax received from Dr. Zachery Dakins with [facility] to perform a RIGHT KNEE TOTAL REPLACEMENT on patient.  Patient needs surgery clearance. Surgery is PENDING. Patient was seen on 12/23/2021. Office protocol is a risk assessment can be sent to surgeon if patient has been seen in 60 days or less.   Sending to Dr Erin Fulling for risk assessment or recommendations if patient needs to be seen in office prior to surgical procedure.    Patient has office visit with Dr Erin Fulling on 02/14/2022

## 2022-02-14 ENCOUNTER — Encounter: Payer: Self-pay | Admitting: Pulmonary Disease

## 2022-02-14 ENCOUNTER — Ambulatory Visit (INDEPENDENT_AMBULATORY_CARE_PROVIDER_SITE_OTHER): Payer: Medicare Other | Admitting: Pulmonary Disease

## 2022-02-14 VITALS — BP 122/74 | HR 88 | Ht 62.0 in | Wt 180.0 lb

## 2022-02-14 DIAGNOSIS — J984 Other disorders of lung: Secondary | ICD-10-CM

## 2022-02-14 MED ORDER — FLUTICASONE-SALMETEROL 115-21 MCG/ACT IN AERO: 2.0000 | INHALATION_SPRAY | Freq: Two times a day (BID) | RESPIRATORY_TRACT | 12 refills | Status: DC

## 2022-02-14 NOTE — Patient Instructions (Addendum)
Start advair inhaler 2 puffs twice daily - rinse mouth out after each visit  Complete steroid taper as planned.   I will have my note sent to the orthopedic team for your upcoming surgery  Follow up in 6 months

## 2022-02-14 NOTE — Progress Notes (Signed)
Synopsis: Referred in April 2023 for abnormal CT chest findings by Brunetta Genera, MD  Subjective:   PATIENT ID: Meghan Alexander GENDER: female DOB: 1955-10-02, MRN: [id number]  HPI  Chief Complaint  Patient presents with   Follow-up    3 mo f/u. Currently on 2 mg of prednisone. Needs clearance for knee surgery.    Meghan Alexander is a 66 year old woman, never smoker with allergies, achalasia, GERD, hypertension, DM II and follicular lymphoma who returns to pulmonary clinic for pneumonitis.   She was re-started on prednisone taper due to her increase in symptoms after completion of the prior steroid taper. She is feeling better with less dyspnea.  ONO results did not indicate a need for nocturnal oxygen.   She is having a knee replacement surgery in the near future.   OV 12/23/21 She reports increasing shortness of breath since completion of steroid taper on 11/08/21. She reports her O2 levels dropping into the 80s based on her home pulse oximeter. She has dyspnea with brushing her teeth or talking on the phone.   OV 11/05/21 Autoimmune lab panel was unremarkable at last visit. She has been tapered down to 46m of prednisone daily. Her breathing has significantly improved. She is doing well since her right ankle surgery.   OV 09/24/21 She was started on 435mprednisone daily on 4/19 which was reduced to 2043maily on 5/2 prior to her ankle surgery on 09/03/21. She was admitted 5/15 to 5/17 for acute hypoxemic respiratory failure. She had an increase in bilateral opacities on CTA Chest scan 5/15. She was seen by inpatient pulmonary team with concern for peri-operative aspiration vs acute infiltrates from covid 19 infection on chronic interstitial findings. She was discharged on home O2 as she desatted with ambulation.  She has not been using oxygen over the past couple of weeks. She resumed 25m37m prednisone daily and is taking bactrim 3 days per week. She denies cough, joint pains or  fevers. She continues to have night sweats. She reports her achalasia symptoms have significantly improved since taking steroids.   Initial OV 08/11/21 She was admitted 4/10 to 4/17 for acute respiratory failure with diffuse ground glass opacities noted on her CT Chest scan. She had progressive cough, night sweats, chills and dyspnea. She completed 3 courses of outpatient antibiotics without improvement. She underwent bronchoscopy with BAL of the RML and transbronchial brushings of the RLL on 4/11. BAL cell count was predominantly lymphocytic. Pathology did not indicate malignant cells on brushings. There was report of concern for actinomyces on path report. BAL culture grew strep mitis. Fungtitell and quantiferon gold are negative. ID was consulted. Patient was treated with ceftriaxone and azithromycin and placed on augmentin for 14 day course and treated with high dose IV steroids and discharged on 5 days of prednisone 25mg82mly. ESR was 75 on 08/02/21  She reports noticing improvement in her breathing after receiving the IV steroids. She did not require supplemental oxygen at time of discharge.   Upon chart review, she received her rituximab infusion on 02/25/21 and was seen again in Oncology clinic 05/11/21 where it was noted she had bronchitis and treated for pneumonia since the infusion. There is no chest imaging for review from late 2022 or early 2023.   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 ([place]) 04/02/2019   GERD (gastroesophageal reflux disease)    hx of reflux-went away with elevating HOB    Hypertension    Pneumonia    hx of    PONV (postoperative nausea and vomiting)    Type 2 diabetes mellitus ([place]) 08/02/2021     Family History  Problem Relation Age of Onset   Lung cancer  Father        21-Jun-1980 died from it   Hernia Mother    Breast cancer Maternal Aunt        multiple    Breast cancer Maternal Grandmother    Breast cancer Paternal Grandmother    Breast cancer Cousin        cousins multiple    Colon cancer Neg Hx    Colon polyps Neg Hx    Rectal cancer Neg Hx    Stomach cancer Neg Hx      Social History   Socioeconomic History   Marital status: Married    Spouse name: Not on file   Number of children: Not on file   Years of education: Not on file   Highest education level: Not on file  Occupational History   Not on file  Tobacco Use   Smoking status: Never   Smokeless tobacco: Never  Vaping Use   Vaping Use: Never used  Substance and Sexual Activity   Alcohol use: No    Alcohol/week: 0.0 standard drinks of alcohol   Drug use: No   Sexual activity: Not on file  Other Topics Concern   Not on file  Social History Narrative   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 [employer]   Never smoker never drinker   No drugs   Multiple allergies   Lives at home with her husband of 8 years since 2006-06-21   Social Determinants of Health   Financial Resource Strain: Not on file  Food Insecurity: Not on file  Transportation Needs: Not on file  Physical Activity: Not on file  Stress: Not on file  Social Connections: Not on file  Intimate Partner Violence: Not on file     Allergies  Allergen Reactions   Latex Other (See Comments)    Blisters / skin peeling   Codeine Nausea And Vomiting and Other (See Comments)    hallucinations   Atenolol Cough   Other Other (See Comments)    DermaPlast; Used after surgery - caused swelling, itching, and wound broke open   Hydrocodone Nausea And Vomiting   Oxycodone Nausea And Vomiting     Outpatient Medications Prior to Visit  Medication Sig Dispense Refill   Accu-Chek Softclix Lancets lancets Use to test blood sugars up to 4 times daily as directed. 100 each 0   aspirin EC 81 MG  tablet Take 81 mg by mouth daily. Swallow whole.     Blood Glucose Monitoring Suppl (BLOOD GLUCOSE MONITOR SYSTEM) w/Device KIT Use to test blood sugars up to 4 times daily. 1 kit 0   diltiazem (CARDIZEM CD) 240 MG 24 hr capsule Take 1 capsule (240 mg total) by mouth daily. 30 capsule 0   diphenhydrAMINE (BENADRYL) 25 MG tablet Take 25 mg by mouth at bedtime.     feeding supplement (ENSURE ENLIVE / ENSURE PLUS) LIQD Take 237 mLs by mouth 3 (three) times daily between meals. (Patient taking differently: Take 237 mLs by mouth 2 (two) times  daily between meals.) 237 mL 12   fluticasone (FLONASE) 50 MCG/ACT nasal spray Place 2 sprays into both nostrils daily.     glucose blood (ACCU-CHEK GUIDE) test strip Use to test blood sugars up to 4 times daily as directed. 100 each 0   HUMALOG KWIKPEN 100 UNIT/ML KwikPen Inject 5 Units into the skin with breakfast, with lunch, and with evening meal. 15 mL 11   insulin detemir (LEVEMIR) 100 UNIT/ML FlexPen Inject 10 Units into the skin daily. 15 mL 0   Insulin Pen Needle 32G X 4 MM MISC Use to inject insulin up to 4 times daily as directed. 100 each 0   losartan (COZAAR) 50 MG tablet Take 25 mg by mouth daily. Take 1/2 Tablet Daily     Melatonin 10 MG CAPS Take 10 mg by mouth at bedtime.      metFORMIN (GLUCOPHAGE) 500 MG tablet Take 500 mg by mouth daily with breakfast.     metoprolol succinate (TOPROL-XL) 25 MG 24 hr tablet Take 1 tablet (25 mg total) by mouth daily. 30 tablet 0   Multiple Vitamin (MULTIVITAMIN) tablet Take 1 tablet by mouth daily.     Niacin (VITAMIN B-3 PO) Take 1 tablet by mouth daily.     omeprazole (PRILOSEC) 20 MG capsule Take 20 mg by mouth daily.     predniSONE (DELTASONE) 10 MG tablet Take 1 tablet (10 mg total) by mouth daily with breakfast. 80 tablet 1   predniSONE (DELTASONE) 5 MG tablet Take 1 tablet (5 mg total) by mouth daily with breakfast. 80 tablet 1   rosuvastatin (CRESTOR) 10 MG tablet Take 10 mg by mouth daily.      venlafaxine XR (EFFEXOR-XR) 37.5 MG 24 hr capsule Take 37.5 mg by mouth daily with breakfast.     ferrous sulfate 325 (65 FE) MG tablet Take 1 tablet (325 mg total) by mouth every other day. 30 tablet 0   No facility-administered medications prior to visit.   Review of Systems  Constitutional:  Negative for chills, fever, malaise/fatigue and weight loss.  HENT:  Negative for congestion, sinus pain and sore throat.   Eyes: Negative.   Respiratory:  Positive for cough and shortness of breath. Negative for hemoptysis, sputum production and wheezing.   Cardiovascular:  Negative for chest pain, palpitations, orthopnea, claudication and leg swelling.  Gastrointestinal:  Negative for abdominal pain, heartburn, nausea and vomiting.  Genitourinary: Negative.   Musculoskeletal:  Negative for joint pain and myalgias.  Skin:  Negative for rash.  Neurological:  Negative for weakness.  Endo/Heme/Allergies: Negative.   Psychiatric/Behavioral: Negative.      Objective:   Vitals:   02/14/22 1458  BP: 122/74  Pulse: 88  SpO2: 98%  Weight: 180 lb (81.6 kg)  Height: _0  (1.575 m)    Physical Exam Constitutional:      General: She is not in acute distress.    Appearance: She is not ill-appearing.  HENT:     Head: Normocephalic and atraumatic.  Eyes:     General: No scleral icterus.    Conjunctiva/sclera: Conjunctivae normal.  Cardiovascular:     Rate and Rhythm: Normal rate and regular rhythm.     Pulses: Normal pulses.     Heart sounds: Normal heart sounds. No murmur heard. Pulmonary:     Effort: Pulmonary effort is normal.     Breath sounds: Normal breath sounds. Decreased air movement present. No wheezing, rhonchi or rales.  Musculoskeletal:     Right lower leg:  No edema.     Left lower leg: No edema.  Skin:    General: Skin is warm and dry.  Neurological:     General: No focal deficit present.     Mental Status: She is alert.  Psychiatric:        Mood and Affect: Mood normal.         Behavior: Behavior normal.        Thought Content: Thought content normal.        Judgment: Judgment normal.    CBC    Component Value Date/Time   WBC 10.4 12/09/2021 1339   WBC 7.6 11/16/2021 1317   WBC 7.3 10/11/2021 0432   RBC 4.58 12/09/2021 1339   RBC 4.40 11/16/2021 1317   HGB 12.0 12/09/2021 1339   HCT 37.1 12/09/2021 1339   PLT 325 12/09/2021 1339   MCV 81 12/09/2021 1339   MCH 26.2 (L) 12/09/2021 1339   MCH 25.9 (L) 11/16/2021 1317   MCHC 32.3 12/09/2021 1339   MCHC 32.3 11/16/2021 1317   RDW 16.7 (H) 12/09/2021 1339   LYMPHSABS 0.4 (L) 11/16/2021 1317   MONOABS 1.0 11/16/2021 1317   EOSABS 0.1 11/16/2021 1317   BASOSABS 0.0 11/16/2021 1317      Latest Ref Rng & Units 12/09/2021    1:39 PM 11/16/2021    1:17 PM 10/11/2021    4:32 AM  BMP  Glucose 70 - 99 mg/dL 198  143  207   BUN 8 - 27 mg/dL _0 Creatinine 0.57 - 1.00 mg/dL 0.89  0.76  0.88   BUN/Creat Ratio 12 - 28 24     Sodium 134 - 144 mmol/L 142  140  138   Potassium 3.5 - 5.2 mmol/L 4.2  3.5  3.9   Chloride 96 - 106 mmol/L 98  102  100   CO2 20 - 29 mmol/L _1 Calcium 8.7 - 10.3 mg/dL 9.5  9.5  9.2    Chest imaging: HRCT Chest 01/25/22 1. No evidence of fibrotic interstitial lung disease. Minimal bland appearing scarring and or atelectasis of the posterior lingula and left lower lobe unchanged compared to prior examinations benign sequelae of prior infection or inflammation. 2. Previously seen acute airspace disease is resolved. 3. Lobular air trapping on expiratory phase imaging, suggestive of small airways disease. 4. 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 5. No evidence of lymphadenopathy in the chest. 6. Coronary artery disease.  CXR 12/23/21 Interval improvement in right-greater-than-left airspace  opacities.  CTA Chest 10/08/21 1. No evidence of acute pulmonary embolism or other acute vascular findings in the chest. 2. Recurrent/fluctuating bilateral ground-glass and airspace opacities, overall improved compared with prior CT of 1 month ago, but worsened from radiographs of 2 weeks ago. Differential considerations include atypical infection, non infectious inflammation, drug reaction and atypical edema. Given the fluctuation, lymphomatous involvement less likely. Radiographic follow up recommended. 3. No adenopathy or pleural effusion. 4. Dilated esophagus with air-fluid levels consistent with achalasia.  CTA Chest 09/06/21 Mediastinum/Nodes: No enlarged mediastinal, hilar, or axillary lymph nodes. Thyroid gland, and trachea demonstrate no significant findings. As before, again seen is the moderately large fluid-filled dilatation of the esophagus consistent with history of achalasia.   Lungs/Pleura: There are confluent patchy opacities seen throughout the lungs and are more prominent in the upper lobes and have significantly  progressed in the interim. Mild bibasilar pleural thickening and was present on the previous study as well.  CXR 09/06/21 Patchy ill-defined nodular appearing areas are noted throughout the lungs bilaterally. In addition, there is more diffuse airspace consolidation throughout the left mid to lower lung. No definite pleural effusions. No pneumothorax. No evidence of pulmonary edema. Heart size is normal. Upper mediastinal contours are within normal limits. Atherosclerotic calcifications are noted in the thoracic aorta.  CT 08/02/21 No definite evidence of pulmonary embolus. Continued presence patchy airspace opacities throughout both lungs which may be slightly more prominent compared to prior exam, concerning for multifocal pneumonia. Hepatic steatosis. Stable dilated esophagus is noted consistent with history of achalasia. Aortic  Atherosclerosis  CT 07/14/21 Mediastinum/Nodes: No discrete thyroid nodule. No pathologically enlarged mediastinal, hilar or axillary lymph nodes. Patulous thoracic esophagus with retained versus refluxed contrast material in the esophagus and mild diffuse esophageal wall thickening.   Lungs/Pleura: Extensive multifocal bilateral ground-glass opacities with interstitial thickening, favored to reflect an infectious or inflammatory etiology. No pleural effusion. No pneumothorax.  PFT:     No data to display          Labs:  Path:  Echo:  Heart Catheterization:    Assessment & Plan:   No diagnosis found.  Discussion: Meghan Alexander is a 66 year old woman, never smoker with allergies, achalasia, GERD, hypertension, DM II and follicular lymphoma who returns to pulmonary clinic for pneumonitis.   She completed steroid taper for pneumonitis on 7/17 and developed increasing dyspnea since. She was restarted on a steroid taper at last visit with improvement in her dyspnea. She does not have oxygen desaturations on simple walkand no desaturations requiring nocturnal oxygen based on ONO.   She is to continue steroid taper as planned.   42m daily for 2 weeks (10/15 to 10/29) 2.516mdaily for 2 weeks (10/30 - 11/13)  She is to start advair inhaler 2 puffs twice daily and continue once oral steroid taper is completed.   Follow up in 6 months.  JoFreda JacksonMD [facility]: [phone] Current Outpatient Medications:    Accu-Chek Softclix Lancets lancets, Use to test blood sugars up to 4 times daily as directed., Disp: 100 each, Rfl: 0   aspirin EC 81 MG tablet, Take 81 mg by mouth daily. Swallow whole., Disp: , Rfl:    Blood Glucose Monitoring Suppl (BLOOD GLUCOSE MONITOR SYSTEM) w/Device KIT, Use to test blood sugars up to 4 times daily., Disp: 1 kit, Rfl: 0   diltiazem (CARDIZEM CD) 240 MG 24 hr capsule, Take 1 capsule (240 mg total) by mouth  daily., Disp: 30 capsule, Rfl: 0   diphenhydrAMINE (BENADRYL) 25 MG tablet, Take 25 mg by mouth at bedtime., Disp: , Rfl:    feeding supplement (ENSURE ENLIVE / ENSURE PLUS) LIQD, Take 237 mLs by mouth 3 (three) times daily between meals. (Patient taking differently: Take 237 mLs by mouth 2 (two) times daily between meals.), Disp: 237 mL, Rfl: 12   fluticasone (FLONASE) 50 MCG/ACT nasal spray, Place 2 sprays into both nostrils daily., Disp: , Rfl:    glucose blood (ACCU-CHEK GUIDE) test strip, Use to test blood sugars up to 4 times daily as directed., Disp: 100 each, Rfl: 0   HUMALOG KWIKPEN 100 UNIT/ML KwikPen, Inject 5 Units into the skin with breakfast, with lunch, and with evening meal., Disp: 15 mL, Rfl: 11   insulin detemir (LEVEMIR) 100 UNIT/ML FlexPen, Inject 10 Units into  the skin daily., Disp: 15 mL, Rfl: 0   Insulin Pen Needle 32G X 4 MM MISC, Use to inject insulin up to 4 times daily as directed., Disp: 100 each, Rfl: 0   losartan (COZAAR) 50 MG tablet, Take 25 mg by mouth daily. Take 1/2 Tablet Daily, Disp: , Rfl:    Melatonin 10 MG CAPS, Take 10 mg by mouth at bedtime. , Disp: , Rfl:    metFORMIN (GLUCOPHAGE) 500 MG tablet, Take 500 mg by mouth daily with breakfast., Disp: , Rfl:    metoprolol succinate (TOPROL-XL) 25 MG 24 hr tablet, Take 1 tablet (25 mg total) by mouth daily., Disp: 30 tablet, Rfl: 0   Multiple Vitamin (MULTIVITAMIN) tablet, Take 1 tablet by mouth daily., Disp: , Rfl:    Niacin (VITAMIN B-3 PO), Take 1 tablet by mouth daily., Disp: , Rfl:    omeprazole (PRILOSEC) 20 MG capsule, Take 20 mg by mouth daily., Disp: , Rfl:    predniSONE (DELTASONE) 10 MG tablet, Take 1 tablet (10 mg total) by mouth daily with breakfast., Disp: 80 tablet, Rfl: 1   predniSONE (DELTASONE) 5 MG tablet, Take 1 tablet (5 mg total) by mouth daily with breakfast., Disp: 80 tablet, Rfl: 1   rosuvastatin (CRESTOR) 10 MG tablet, Take 10 mg by mouth daily., Disp: , Rfl:    venlafaxine XR  (EFFEXOR-XR) 37.5 MG 24 hr capsule, Take 37.5 mg by mouth daily with breakfast., Disp: , Rfl:    ferrous sulfate 325 (65 FE) MG tablet, Take 1 tablet (325 mg total) by mouth every other day., Disp: 30 tablet, Rfl: 0

## 2022-02-16 ENCOUNTER — Other Ambulatory Visit: Payer: Self-pay | Admitting: Pulmonary Disease

## 2022-02-16 DIAGNOSIS — J984 Other disorders of lung: Secondary | ICD-10-CM

## 2022-02-17 NOTE — Telephone Encounter (Signed)
Patient is requesting a refill on her Bactrim to take every Monday, Wednesday and Friday. It was not mentioned during the last OV on 10/23.   Dr. Erin Fulling, please advise if you are ok with this refill. Thanks!

## 2022-02-21 NOTE — Telephone Encounter (Signed)
OV notes and clearance form have been faxed back to [facility]. Nothing further needed at this time.  °

## 2022-02-24 ENCOUNTER — Encounter: Payer: Self-pay | Admitting: Pulmonary Disease

## 2022-02-28 ENCOUNTER — Emergency Department (HOSPITAL_COMMUNITY): Payer: Medicare Other

## 2022-02-28 ENCOUNTER — Other Ambulatory Visit: Payer: Self-pay

## 2022-02-28 ENCOUNTER — Encounter (HOSPITAL_COMMUNITY): Payer: Self-pay | Admitting: *Deleted

## 2022-02-28 ENCOUNTER — Observation Stay (HOSPITAL_COMMUNITY)
Admission: EM | Admit: 2022-02-28 | Discharge: 2022-03-02 | Disposition: A | Discharge status: Home / Self Care | Payer: Medicare Other | Attending: Internal Medicine | Admitting: Internal Medicine

## 2022-02-28 DIAGNOSIS — I480 Paroxysmal atrial fibrillation: Secondary | ICD-10-CM | POA: Insufficient documentation

## 2022-02-28 DIAGNOSIS — Z91040 Latex allergy status: Secondary | ICD-10-CM | POA: Diagnosis not present

## 2022-02-28 DIAGNOSIS — K921 Melena: Secondary | ICD-10-CM | POA: Insufficient documentation

## 2022-02-28 DIAGNOSIS — Z79899 Other long term (current) drug therapy: Secondary | ICD-10-CM | POA: Diagnosis not present

## 2022-02-28 DIAGNOSIS — N179 Acute kidney failure, unspecified: Secondary | ICD-10-CM | POA: Insufficient documentation

## 2022-02-28 DIAGNOSIS — K529 Noninfective gastroenteritis and colitis, unspecified: Principal | ICD-10-CM | POA: Diagnosis present

## 2022-02-28 DIAGNOSIS — Z794 Long term (current) use of insulin: Secondary | ICD-10-CM | POA: Insufficient documentation

## 2022-02-28 DIAGNOSIS — A040 Enteropathogenic Escherichia coli infection: Principal | ICD-10-CM | POA: Insufficient documentation

## 2022-02-28 DIAGNOSIS — E119 Type 2 diabetes mellitus without complications: Secondary | ICD-10-CM | POA: Diagnosis not present

## 2022-02-28 DIAGNOSIS — Z7984 Long term (current) use of oral hypoglycemic drugs: Secondary | ICD-10-CM | POA: Diagnosis not present

## 2022-02-28 DIAGNOSIS — Z8572 Personal history of non-Hodgkin lymphomas: Secondary | ICD-10-CM | POA: Insufficient documentation

## 2022-02-28 DIAGNOSIS — R197 Diarrhea, unspecified: Secondary | ICD-10-CM | POA: Diagnosis present

## 2022-02-28 DIAGNOSIS — I10 Essential (primary) hypertension: Secondary | ICD-10-CM | POA: Insufficient documentation

## 2022-02-28 LAB — CBC WITH DIFFERENTIAL/PLATELET
Abs Immature Granulocytes: 0.41 10*3/uL — ABNORMAL HIGH (ref 0.00–0.07)
Basophils Absolute: 0 10*3/uL (ref 0.0–0.1)
Basophils Relative: 0 %
Eosinophils Absolute: 0.1 10*3/uL (ref 0.0–0.5)
Eosinophils Relative: 1 %
HCT: 36.8 % (ref 36.0–46.0)
Hemoglobin: 11.9 g/dL — ABNORMAL LOW (ref 12.0–15.0)
Immature Granulocytes: 2 %
Lymphocytes Relative: 5 %
Lymphs Abs: 1 10*3/uL (ref 0.7–4.0)
MCH: 27.2 pg (ref 26.0–34.0)
MCHC: 32.3 g/dL (ref 30.0–36.0)
MCV: 84 fL (ref 80.0–100.0)
Monocytes Absolute: 2.8 10*3/uL — ABNORMAL HIGH (ref 0.1–1.0)
Monocytes Relative: 15 %
Neutro Abs: 14.4 10*3/uL — ABNORMAL HIGH (ref 1.7–7.7)
Neutrophils Relative %: 77 %
Platelets: 364 10*3/uL (ref 150–400)
RBC: 4.38 MIL/uL (ref 3.87–5.11)
RDW: 15.7 % — ABNORMAL HIGH (ref 11.5–15.5)
WBC: 18.7 10*3/uL — ABNORMAL HIGH (ref 4.0–10.5)
nRBC: 0 % (ref 0.0–0.2)

## 2022-02-28 LAB — URINALYSIS, ROUTINE W REFLEX MICROSCOPIC
Bilirubin Urine: NEGATIVE
Glucose, UA: NEGATIVE mg/dL
Hgb urine dipstick: NEGATIVE
Ketones, ur: NEGATIVE mg/dL
Leukocytes,Ua: NEGATIVE
Nitrite: NEGATIVE
Protein, ur: NEGATIVE mg/dL
Specific Gravity, Urine: 1.018 (ref 1.005–1.030)
pH: 5 (ref 5.0–8.0)

## 2022-02-28 LAB — LIPASE, BLOOD: Lipase: 22 U/L (ref 11–51)

## 2022-02-28 LAB — MAGNESIUM: Magnesium: 1.6 mg/dL — ABNORMAL LOW (ref 1.7–2.4)

## 2022-02-28 LAB — CBG MONITORING, ED: Glucose-Capillary: 155 mg/dL — ABNORMAL HIGH (ref 70–99)

## 2022-02-28 LAB — PHOSPHORUS: Phosphorus: 3.8 mg/dL (ref 2.5–4.6)

## 2022-02-28 MED ORDER — SODIUM CHLORIDE 0.9 % IV BOLUS
1000.0000 mL | Freq: Once | INTRAVENOUS | Status: AC
  Administered 2022-02-28: 1000 mL via INTRAVENOUS

## 2022-02-28 MED ORDER — ACETAMINOPHEN 325 MG PO TABS
650.0000 mg | ORAL_TABLET | Freq: Once | ORAL | Status: AC
  Administered 2022-02-28: 650 mg via ORAL
  Filled 2022-02-28: qty 2

## 2022-02-28 MED ORDER — ONDANSETRON 4 MG PO TBDP
4.0000 mg | ORAL_TABLET | Freq: Once | ORAL | Status: AC
  Administered 2022-02-28: 4 mg via ORAL
  Filled 2022-02-28: qty 1

## 2022-02-28 NOTE — ED Provider Triage Note (Signed)
Emergency Medicine Provider Triage Evaluation Note  Meghan Alexander , a 66 y.o. female  was evaluated in triage.  Pt complains of watery gushing diarrhea month w/associated nausea and vomiting. Feel very fatigued w/severe headache. Reports eating at [employer] and then symptoms started. Has had night sweats and day sweats. Reports symptoms have gotten much worse in the past 2 weeks. Diarrhea up to 10x day.  Review of Systems  Positive: Diarrhea, fatigue Negative: Abdominal pani  Physical Exam  BP 131/75   Pulse (!) 124   Temp (!) 100.8 F (38.2 C) (Oral)   Resp 16   Ht '5\' 2"'$  (1.575 m)   Wt 72.6 kg   SpO2 97%   BMI 29.26 kg/m  Gen:   Awake, no distress   Resp:  Normal effort  MSK:   Moves extremities without difficulty  Other:  No ttp of abdomen  Medical Decision Making  Medically screening exam initiated at 8:37 PM.  Appropriate orders placed.  Meghan Alexander was informed that the remainder of the evaluation will be completed by another provider, this initial triage assessment does not replace that evaluation, and the importance of remaining in the ED until their evaluation is complete.    Osvaldo Shipper, [place] 02/28/22 2038

## 2022-02-28 NOTE — ED Triage Notes (Signed)
Pt stating that she has had watery diarrhea since Oct 6th-since she ate at a [employer]. Onset of vomiting about a week ago. Has had dizziness, headache. Denies fevers. Took anti diarrhea med around 0800

## 2022-03-01 ENCOUNTER — Encounter (HOSPITAL_COMMUNITY): Payer: Self-pay

## 2022-03-01 ENCOUNTER — Emergency Department (HOSPITAL_COMMUNITY): Payer: Medicare Other

## 2022-03-01 DIAGNOSIS — K529 Noninfective gastroenteritis and colitis, unspecified: Secondary | ICD-10-CM | POA: Diagnosis present

## 2022-03-01 LAB — COMPREHENSIVE METABOLIC PANEL
ALT: 16 U/L (ref 0–44)
AST: 13 U/L — ABNORMAL LOW (ref 15–41)
Albumin: 2.8 g/dL — ABNORMAL LOW (ref 3.5–5.0)
Alkaline Phosphatase: 77 U/L (ref 38–126)
Anion gap: 12 (ref 5–15)
BUN: 26 mg/dL — ABNORMAL HIGH (ref 8–23)
CO2: 24 mmol/L (ref 22–32)
Calcium: 8.6 mg/dL — ABNORMAL LOW (ref 8.9–10.3)
Chloride: 95 mmol/L — ABNORMAL LOW (ref 98–111)
Creatinine, Ser: 1.42 mg/dL — ABNORMAL HIGH (ref 0.44–1.00)
GFR, Estimated: 40 mL/min — ABNORMAL LOW (ref >60)
Glucose, Bld: 160 mg/dL — ABNORMAL HIGH (ref 70–99)
Potassium: 4.6 mmol/L (ref 3.5–5.1)
Sodium: 131 mmol/L — ABNORMAL LOW (ref 135–145)
Total Bilirubin: 0.7 mg/dL (ref 0.3–1.2)
Total Protein: 6 g/dL — ABNORMAL LOW (ref 6.5–8.1)

## 2022-03-01 LAB — CBC
HCT: 34.2 % — ABNORMAL LOW (ref 36.0–46.0)
Hemoglobin: 11.5 g/dL — ABNORMAL LOW (ref 12.0–15.0)
MCH: 27.7 pg (ref 26.0–34.0)
MCHC: 33.6 g/dL (ref 30.0–36.0)
MCV: 82.4 fL (ref 80.0–100.0)
Platelets: 326 10*3/uL (ref 150–400)
RBC: 4.15 MIL/uL (ref 3.87–5.11)
RDW: 15.8 % — ABNORMAL HIGH (ref 11.5–15.5)
WBC: 10.6 10*3/uL — ABNORMAL HIGH (ref 4.0–10.5)
nRBC: 0 % (ref 0.0–0.2)

## 2022-03-01 LAB — HEMOGLOBIN A1C
Hgb A1c MFr Bld: 7.2 % — ABNORMAL HIGH (ref 4.8–5.6)
Mean Plasma Glucose: 159.94 mg/dL

## 2022-03-01 LAB — BASIC METABOLIC PANEL
Anion gap: 10 (ref 5–15)
BUN: 23 mg/dL (ref 8–23)
CO2: 26 mmol/L (ref 22–32)
Calcium: 8.7 mg/dL — ABNORMAL LOW (ref 8.9–10.3)
Chloride: 100 mmol/L (ref 98–111)
Creatinine, Ser: 1.24 mg/dL — ABNORMAL HIGH (ref 0.44–1.00)
GFR, Estimated: 47 mL/min — ABNORMAL LOW (ref >60)
Glucose, Bld: 144 mg/dL — ABNORMAL HIGH (ref 70–99)
Potassium: 3.9 mmol/L (ref 3.5–5.1)
Sodium: 136 mmol/L (ref 135–145)

## 2022-03-01 LAB — MAGNESIUM: Magnesium: 2.2 mg/dL (ref 1.7–2.4)

## 2022-03-01 LAB — C-REACTIVE PROTEIN: CRP: 30.2 mg/dL — ABNORMAL HIGH (ref <1.0)

## 2022-03-01 LAB — T4, FREE: Free T4: 1.47 ng/dL — ABNORMAL HIGH (ref 0.61–1.12)

## 2022-03-01 LAB — C DIFFICILE QUICK SCREEN W PCR REFLEX
C Diff antigen: NEGATIVE
C Diff interpretation: NOT DETECTED
C Diff toxin: NEGATIVE

## 2022-03-01 LAB — CBG MONITORING, ED
Glucose-Capillary: 140 mg/dL — ABNORMAL HIGH (ref 70–99)
Glucose-Capillary: 215 mg/dL — ABNORMAL HIGH (ref 70–99)

## 2022-03-01 LAB — PHOSPHORUS: Phosphorus: 3.4 mg/dL (ref 2.5–4.6)

## 2022-03-01 LAB — SEDIMENTATION RATE: Sed Rate: 74 mm/hr — ABNORMAL HIGH (ref 0–22)

## 2022-03-01 LAB — TSH: TSH: 1.008 u[IU]/mL (ref 0.350–4.500)

## 2022-03-01 MED ORDER — DILTIAZEM HCL ER COATED BEADS 240 MG PO CP24
240.0000 mg | ORAL_CAPSULE | Freq: Every day | ORAL | Status: DC
  Administered 2022-03-02: 240 mg via ORAL
  Filled 2022-03-01 (×3): qty 1

## 2022-03-01 MED ORDER — ACETAMINOPHEN 325 MG PO TABS
650.0000 mg | ORAL_TABLET | Freq: Once | ORAL | Status: AC
  Administered 2022-03-01: 650 mg via ORAL
  Filled 2022-03-01: qty 2

## 2022-03-01 MED ORDER — ONDANSETRON HCL 4 MG/2ML IJ SOLN: 4.0000 mg | Freq: Four times a day (QID) | INTRAMUSCULAR | Status: DC | PRN

## 2022-03-01 MED ORDER — CIPROFLOXACIN IN D5W 400 MG/200ML IV SOLN
400.0000 mg | Freq: Once | INTRAVENOUS | Status: AC
  Administered 2022-03-01: 400 mg via INTRAVENOUS
  Filled 2022-03-01: qty 200

## 2022-03-01 MED ORDER — INSULIN DETEMIR 100 UNIT/ML ~~LOC~~ SOLN
10.0000 [IU] | Freq: Every day | SUBCUTANEOUS | Status: DC
  Administered 2022-03-01: 10 [IU] via SUBCUTANEOUS
  Filled 2022-03-01 (×3): qty 0.1

## 2022-03-01 MED ORDER — MAGNESIUM SULFATE 2 GM/50ML IV SOLN
2.0000 g | Freq: Once | INTRAVENOUS | Status: AC
  Administered 2022-03-01: 2 g via INTRAVENOUS
  Filled 2022-03-01: qty 50

## 2022-03-01 MED ORDER — ONDANSETRON HCL 4 MG PO TABS: 4.0000 mg | ORAL_TABLET | Freq: Four times a day (QID) | ORAL | Status: DC | PRN

## 2022-03-01 MED ORDER — LACTATED RINGERS IV BOLUS: 1000.0000 mL | Freq: Once | INTRAVENOUS | Status: DC

## 2022-03-01 MED ORDER — SODIUM CHLORIDE 0.9 % IV SOLN: 1.0000 g | INTRAVENOUS | Status: DC

## 2022-03-01 MED ORDER — IOHEXOL 350 MG/ML SOLN
75.0000 mL | Freq: Once | INTRAVENOUS | Status: AC | PRN
  Administered 2022-03-01: 75 mL via INTRAVENOUS

## 2022-03-01 MED ORDER — ENOXAPARIN SODIUM 40 MG/0.4ML IJ SOSY
40.0000 mg | PREFILLED_SYRINGE | Freq: Every day | INTRAMUSCULAR | Status: DC
  Administered 2022-03-02: 40 mg via SUBCUTANEOUS
  Filled 2022-03-01: qty 0.4

## 2022-03-01 MED ORDER — INSULIN ASPART 100 UNIT/ML IJ SOLN
0.0000 [IU] | Freq: Three times a day (TID) | INTRAMUSCULAR | Status: DC
  Administered 2022-03-01 – 2022-03-02 (×2): 2 [IU] via SUBCUTANEOUS
  Administered 2022-03-02: 3 [IU] via SUBCUTANEOUS

## 2022-03-01 MED ORDER — FLUTICASONE FUROATE-VILANTEROL 200-25 MCG/ACT IN AEPB
1.0000 | INHALATION_SPRAY | Freq: Every day | RESPIRATORY_TRACT | Status: DC
  Administered 2022-03-02: 1 via RESPIRATORY_TRACT
  Filled 2022-03-01: qty 28

## 2022-03-01 MED ORDER — LACTATED RINGERS IV BOLUS
1000.0000 mL | Freq: Once | INTRAVENOUS | Status: AC
  Administered 2022-03-01: 1000 mL via INTRAVENOUS

## 2022-03-01 MED ORDER — INSULIN ASPART 100 UNIT/ML IJ SOLN
0.0000 [IU] | Freq: Every day | INTRAMUSCULAR | Status: DC
  Administered 2022-03-01: 2 [IU] via SUBCUTANEOUS

## 2022-03-01 MED ORDER — METOPROLOL SUCCINATE ER 25 MG PO TB24
25.0000 mg | ORAL_TABLET | Freq: Every day | ORAL | Status: DC
  Administered 2022-03-01 – 2022-03-02 (×2): 25 mg via ORAL
  Filled 2022-03-01 (×2): qty 1

## 2022-03-01 MED ORDER — PHENYLEPHRINE-MINERAL OIL-PET 0.25-14-74.9 % RE OINT: 1.0000 | TOPICAL_OINTMENT | Freq: Two times a day (BID) | RECTAL | Status: DC | PRN

## 2022-03-01 MED ORDER — ACETAMINOPHEN 650 MG RE SUPP: 650.0000 mg | Freq: Four times a day (QID) | RECTAL | Status: DC | PRN

## 2022-03-01 MED ORDER — ACETAMINOPHEN 325 MG PO TABS: 650.0000 mg | ORAL_TABLET | Freq: Four times a day (QID) | ORAL | Status: DC | PRN

## 2022-03-01 MED ORDER — METRONIDAZOLE 500 MG PO TABS
500.0000 mg | ORAL_TABLET | Freq: Two times a day (BID) | ORAL | Status: DC
  Administered 2022-03-01: 500 mg via ORAL
  Filled 2022-03-01: qty 1

## 2022-03-01 MED ORDER — VENLAFAXINE HCL ER 37.5 MG PO CP24
37.5000 mg | ORAL_CAPSULE | Freq: Every day | ORAL | Status: DC
  Administered 2022-03-02: 37.5 mg via ORAL
  Filled 2022-03-01: qty 1

## 2022-03-01 MED ORDER — METRONIDAZOLE 500 MG/100ML IV SOLN
500.0000 mg | Freq: Once | INTRAVENOUS | Status: AC
  Administered 2022-03-01: 500 mg via INTRAVENOUS
  Filled 2022-03-01: qty 100

## 2022-03-01 NOTE — ED Notes (Signed)
Pt can ambulate to bathroom without assistance

## 2022-03-01 NOTE — ED Notes (Signed)
Patient provided with personal hygiene supplies

## 2022-03-01 NOTE — ED Provider Notes (Signed)
[facility] EMERGENCY DEPARTMENT Provider Note   CSN: [id number] Arrival date & time: 02/28/22  1928     History  Chief Complaint  Patient presents with   Diarrhea    Meghan Alexander is a 66 y.o. female.  HPI 66 year old female with a history of follicular lymphoma in remission, hypertension, pneumonitis, DM type II, GERD, A-fib with RVR presents to the ER with complaints of diarrhea x1 month.  Watery diarrhea started on October 6 after the patient had a meal at a [employer].  She reports between 10-18 episodes of watery diarrhea daily.  Endorses mild generalized abdominal pain.  She has a history of hemorrhoids and states that they have been intermittently bleeding but no significant blood in the stool noted.  She has felt chills over the last several days.  ER visit prompted by her husband which is why she presented today.  She denies any recent travel, no recent antibiotic course.  She reports a history of follicular lymphoma which has been in remission, and was recently treated with steroids for a pneumonitis.  No personal history of IBD but reports sister has a history of Crohn's disease.  Recent Cologuard was negative.  Denies any intended weight loss.    Home Medications Prior to Admission medications   Medication Sig Start Date End Date Taking? Authorizing Provider  diltiazem (CARDIZEM CD) 240 MG 24 hr capsule Take 1 capsule (240 mg total) by mouth daily. 10/11/21  Yes Zola Button, MD  diphenhydramine-acetaminophen (TYLENOL PM) 25-500 MG TABS tablet Take 1 tablet by mouth at bedtime as needed (pain/sleep).   Yes [provider]  ferrous sulfate 325 (65 FE) MG tablet Take 1 tablet (325 mg total) by mouth every other day. Patient taking differently: Take 325 mg by mouth daily. 09/08/21 03/01/22 Yes Gerrit Heck, MD  fluticasone-salmeterol (ADVAIR HFA) 559-74 MCG/ACT inhaler Inhale 2 puffs into the lungs 2 (two) times daily. Patient taking  differently: Inhale 2 puffs into the lungs daily. 02/14/22  Yes Freddi Starr, MD  HUMALOG KWIKPEN 100 UNIT/ML KwikPen Inject 5 Units into the skin with breakfast, with lunch, and with evening meal. Patient taking differently: Inject 15 Units into the skin 2 (two) times daily. 10/11/21  Yes Zola Button, MD  insulin detemir (LEVEMIR) 100 UNIT/ML FlexPen Inject 10 Units into the skin daily. 10/11/21  Yes Zola Button, MD  losartan (COZAAR) 50 MG tablet Take 25 mg by mouth daily.   Yes [provider]  Melatonin 10 MG CAPS Take 10 mg by mouth at bedtime.    Yes [provider]  meloxicam (MOBIC) 15 MG tablet Take 15 mg by mouth daily. 02/16/22  Yes [provider]  metFORMIN (GLUCOPHAGE) 500 MG tablet Take 500 mg by mouth daily with breakfast. 07/23/19  Yes [provider]  metoprolol succinate (TOPROL-XL) 25 MG 24 hr tablet Take 1 tablet (25 mg total) by mouth daily. 10/12/21  Yes Zola Button, MD  Multiple Vitamin (MULTIVITAMIN) tablet Take 1 tablet by mouth daily.   Yes [provider]  omeprazole (PRILOSEC) 20 MG capsule Take 20 mg by mouth daily. 11/28/20  Yes [provider]  rosuvastatin (CRESTOR) 10 MG tablet Take 10 mg by mouth daily.   Yes [provider]  venlafaxine XR (EFFEXOR-XR) 37.5 MG 24 hr capsule Take 37.5 mg by mouth daily with breakfast.   Yes [provider]  Accu-Chek Softclix Lancets lancets Use to test blood sugars up to 4 times daily as directed.  08/09/21   Allie Bossier, MD  Blood Glucose Monitoring Suppl (BLOOD GLUCOSE MONITOR SYSTEM) w/Device KIT Use to test blood sugars up to 4 times daily. 08/09/21   Allie Bossier, MD  feeding supplement (ENSURE ENLIVE / ENSURE PLUS) LIQD Take 237 mLs by mouth 3 (three) times daily between meals. Patient not taking: Reported on 03/01/2022 09/08/21   Ezequiel Essex, MD  glucose blood (ACCU-CHEK GUIDE) test strip Use to test blood sugars up to 4 times daily as  directed. 08/09/21   Allie Bossier, MD  Insulin Pen Needle 32G X 4 MM MISC Use to inject insulin up to 4 times daily as directed. 08/09/21   Allie Bossier, MD  predniSONE (DELTASONE) 10 MG tablet Take 1 tablet (10 mg total) by mouth daily with breakfast. Patient not taking: Reported on 03/01/2022 12/23/21   Freddi Starr, MD  predniSONE (DELTASONE) 5 MG tablet Take 1 tablet (5 mg total) by mouth daily with breakfast. Patient not taking: Reported on 03/01/2022 12/23/21   Freddi Starr, MD      Allergies    Latex, Codeine, Atenolol, Other, Hydrocodone, and Oxycodone    Review of Systems   Review of Systems Ten systems reviewed and are negative for acute change, except as noted in the HPI.   Physical Exam Updated Vital Signs BP 134/66   Pulse (!) 117   Temp 98.2 F (36.8 C) (Oral)   Resp (!) 22   Ht _0  (1.575 m)   Wt 72.6 kg   SpO2 96%   BMI 29.26 kg/m  Physical Exam Vitals and nursing note reviewed.  Constitutional:      General: She is not in acute distress.    Appearance: She is well-developed.  HENT:     Head: Normocephalic and atraumatic.  Eyes:     Conjunctiva/sclera: Conjunctivae normal.  Cardiovascular:     Rate and Rhythm: Normal rate and regular rhythm.     Heart sounds: No murmur heard. Pulmonary:     Effort: Pulmonary effort is normal. No respiratory distress.     Breath sounds: Normal breath sounds.  Abdominal:     Palpations: Abdomen is soft.     Tenderness: There is no abdominal tenderness.     Comments: Mildly diffusely tender, soft, no CVA tenderness  Musculoskeletal:        General: No swelling.     Cervical back: Neck supple.  Skin:    General: Skin is warm and dry.     Capillary Refill: Capillary refill takes less than 2 seconds.  Neurological:     Mental Status: She is alert.  Psychiatric:        Mood and Affect: Mood normal.     ED Results / Procedures / Treatments   Labs (all labs ordered are listed, but only abnormal results  are displayed) Labs Reviewed  CBC WITH DIFFERENTIAL/PLATELET - Abnormal; Notable for the following components:      Result Value   WBC 18.7 (*)    Hemoglobin 11.9 (*)    RDW 15.7 (*)    Neutro Abs 14.4 (*)    Monocytes Absolute 2.8 (*)    Abs Immature Granulocytes 0.41 (*)    All other components within normal limits  URINALYSIS, ROUTINE W REFLEX MICROSCOPIC - Abnormal; Notable for the following components:   Color, Urine AMBER (*)    APPearance HAZY (*)    All other components within normal limits  MAGNESIUM - Abnormal; Notable for the following  components:   Magnesium 1.6 (*)    All other components within normal limits  COMPREHENSIVE METABOLIC PANEL - Abnormal; Notable for the following components:   Sodium 131 (*)    Chloride 95 (*)    Glucose, Bld 160 (*)    BUN 26 (*)    Creatinine, Ser 1.42 (*)    Calcium 8.6 (*)    Total Protein 6.0 (*)    Albumin 2.8 (*)    AST 13 (*)    GFR, Estimated 40 (*)    All other components within normal limits  T4, FREE - Abnormal; Notable for the following components:   Free T4 1.47 (*)    All other components within normal limits  CBG MONITORING, ED - Abnormal; Notable for the following components:   Glucose-Capillary 155 (*)    All other components within normal limits  C DIFFICILE QUICK SCREEN W PCR REFLEX    GASTROINTESTINAL PANEL BY PCR, STOOL (REPLACES STOOL CULTURE)  LIPASE, BLOOD  PHOSPHORUS  TSH  T3, FREE  BASIC METABOLIC PANEL  CBC  HEMOGLOBIN A1C  MAGNESIUM  PHOSPHORUS  LACTOFERRIN, FECAL, QUALITATIVE  GLIADIN ANTIBODIES, SERUM  TISSUE TRANSGLUTAMINASE, IGA  RETICULIN ANTIBODIES, IGA W TITER  C-REACTIVE PROTEIN  SEDIMENTATION RATE  PANCREATIC ELASTASE, FECAL    EKG EKG Interpretation  Date/Time:  Monday February 28 2022 20:13:58 EST Ventricular Rate:  125 PR Interval:  150 QRS Duration: 72 QT Interval:  306 QTC Calculation: 441 R Axis:   33 Text Interpretation: Sinus tachycardia Otherwise normal ECG When  compared with ECG of 08-Oct-2021 04:34, Sinus tachycardia has replaced Atrial fibrillation with rapid ventricular response T wave abnormality is no longer present Confirmed by Delora Fuel ([id number]) on 03/01/2022 1:47:52 AM  Radiology CT ABDOMEN PELVIS W CONTRAST  Result Date: 03/01/2022 CLINICAL DATA:  Diarrhea with nausea and vomiting. EXAM: CT ABDOMEN AND PELVIS WITH CONTRAST TECHNIQUE: Multidetector CT imaging of the abdomen and pelvis was performed using the standard protocol following bolus administration of intravenous contrast. RADIATION DOSE REDUCTION: This exam was performed according to the departmental dose-optimization program which includes automated exposure control, adjustment of the mA and/or kV according to patient size and/or use of iterative reconstruction technique. CONTRAST:  92m OMNIPAQUE IOHEXOL 350 MG/ML SOLN COMPARISON:  07/14/2021. FINDINGS: Lower chest: Mild atelectasis or scarring at the lung bases. Hepatobiliary: Cysts and calcified granuloma are noted in the liver. No biliary ductal dilatation. The gallbladder is without stones. Pancreas: Unremarkable. No pancreatic ductal dilatation or surrounding inflammatory changes. Spleen: Normal size.  Calcified granuloma are noted. Adrenals/Urinary Tract: Adrenal glands are stable. The kidneys enhance symmetrically. No renal calculus or hydronephrosis. The bladder is unremarkable. Stomach/Bowel: In the distal esophagus is patulous and unchanged from the prior exam. Stomach is within normal limits. Appendix appears normal. No evidence of bowel wall thickening, distention, or inflammatory changes. No free air or pneumatosis. A few scattered diverticula are noted along the colon without evidence of diverticulitis. There is mild diffuse colonic wall thickening with enhancement. Vascular/Lymphatic: Aortic atherosclerosis. No enlarged abdominal or pelvic lymph nodes. Reproductive: Status post hysterectomy. No adnexal masses. Other: Small fat  containing periumbilical hernia. No abdominopelvic ascites. Musculoskeletal: Bilateral breast implants are noted. There are degenerative changes in the thoracolumbar spine. No acute osseous abnormality. IMPRESSION: 1. Mild diffuse colonic wall thickening and mucosal enhancement, possible infectious or inflammatory colitis. 2. Diverticulosis without diverticulitis. 3. Aortic atherosclerosis. Electronically Signed   By: LBrett FairyM.D.   On: 03/01/2022 02:01   DG Abdomen 1  View  Result Date: 02/28/2022 CLINICAL DATA:  Vomiting and watery diarrhea. EXAM: ABDOMEN - 1 VIEW COMPARISON:  None Available. FINDINGS: The bowel gas pattern is normal. Radiopaque surgical clips are seen overlying the medial aspect of the left upper quadrant. No radio-opaque calculi or other significant radiographic abnormality are seen. IMPRESSION: Nonobstructive bowel gas pattern. Electronically Signed   By: Virgina Norfolk M.D.   On: 02/28/2022 21:26    Procedures Procedures    Medications Ordered in ED Medications  enoxaparin (LOVENOX) injection 40 mg (40 mg Subcutaneous Not Given 03/01/22 1339)  acetaminophen (TYLENOL) tablet 650 mg (has no administration in time range)    Or  acetaminophen (TYLENOL) suppository 650 mg (has no administration in time range)  ondansetron (ZOFRAN) tablet 4 mg (has no administration in time range)    Or  ondansetron (ZOFRAN) injection 4 mg (has no administration in time range)  diltiazem (CARDIZEM CD) 24 hr capsule 240 mg (has no administration in time range)  fluticasone furoate-vilanterol (BREO ELLIPTA) 200-25 MCG/ACT 1 puff (has no administration in time range)  metoprolol succinate (TOPROL-XL) 24 hr tablet 25 mg (has no administration in time range)  venlafaxine XR (EFFEXOR-XR) 24 hr capsule 37.5 mg (has no administration in time range)  insulin detemir (LEVEMIR) FlexPen 10 Units (has no administration in time range)  insulin aspart (novoLOG) injection 0-15 Units (has no  administration in time range)  insulin aspart (novoLOG) injection 0-5 Units (has no administration in time range)  phenylephrine-shark liver oil-mineral oil-petrolatum (PREPARATION H) rectal ointment 1 Application (has no administration in time range)  sodium chloride 0.9 % bolus 1,000 mL (0 mLs Intravenous Stopped 03/01/22 1027)  ondansetron (ZOFRAN-ODT) disintegrating tablet 4 mg (4 mg Oral Given 02/28/22 2104)  acetaminophen (TYLENOL) tablet 650 mg (650 mg Oral Given 02/28/22 2104)  iohexol (OMNIPAQUE) 350 MG/ML injection 75 mL (75 mLs Intravenous Contrast Given 03/01/22 0149)  magnesium sulfate IVPB 2 g 50 mL (0 g Intravenous Stopped 03/01/22 1213)  lactated ringers bolus 1,000 mL (0 mLs Intravenous Stopped 03/01/22 1325)  ciprofloxacin (CIPRO) IVPB 400 mg (0 mg Intravenous Stopped 03/01/22 1318)  metroNIDAZOLE (FLAGYL) IVPB 500 mg (0 mg Intravenous Stopped 03/01/22 1213)  acetaminophen (TYLENOL) tablet 650 mg (650 mg Oral Given 03/01/22 1245)  lactated ringers bolus 1,000 mL (1,000 mLs Intravenous New Bag/Given 03/01/22 1339)    ED Course/ Medical Decision Making/ A&P                           Medical Decision Making Amount and/or Complexity of Data Reviewed Labs: ordered.  Risk Prescription drug management. Decision regarding hospitalization.   INITIAL IMPRESSION / ASSESSMENT AND PLAN / ED COURSE   Pertinent labs & imaging results that were available during my care of the patient were reviewed by me and considered in my medical decision making (see chart for details).   This patient is Presenting for Evaluation of profuse diarrhea, which does require a range of treatment options, and is a complaint that involves a high risk of morbidity and mortality.   The Differential Diagnoses includes but is not exclusive to infectious colitis, C. difficile, diverticulitis, IBD (Crohn's, UC), malignancy   I did obtain Additional Historical Information from patient's been at bedside   I decided to  review pertinent External Data, and in summary follicular lymphoma in remission, recent steroid course for pneumonitis   Clinical Laboratory Tests ordered, reviewed, and interpreted by me   CBC with a leukocytosis of  18.7, hemoglobin of 11.9 with elevated neutrophil count of 14.4 CMP with hyponatremia 131, glucose of 160, creatinine 1.42 (baseline 0.8), consistent with AKI, mild hypocalcemia of 8.6 UA negative for leukocytes, nitrates, consistent with UTI C. difficile and GI panel pending Magnesium of 1.6 Lipase normal TSH pending   Radiologic Tests Ordered,  I independently interpreted the images and agree with radiology interpretation.   CT ABDOMEN PELVIS W/CONTRAST  IMPRESSION:  1. Mild diffuse colonic wall thickening and mucosal enhancement,  possible infectious or inflammatory colitis.  2. Diverticulosis without diverticulitis.  3. Aortic atherosclerosis.     Cardiac Monitor Tracing which shows sinus tachycardia w/ a HR 125   Social Determinants of Health Risk  - H/o cancer    Medical Decision Making: Summary:  66 year old female presenting with 1 month of profuse watery diarrhea.  Meeting SIRS criteria on arrival to the ER with a leukocytosis of 18.7 and tachycardia with a rate of 115-120.  Abdomen diffusely tender, no peritoneal signs, no guarding. No CP, SOB.  CT with diffuse colonic wall thickening with possible infectious or inflammatory colitis.  She also has an AKI with a creatinine 1.42.  Received 1 L fluid bolus in triage as well as Tylenol and Zofran.  Will start on intra-abdominal coverage with Cipro and Flagyl, as well as give additional liter of fluids.  Patient aware that she will need to provide a stool sample.  Plan for admission for ongoing work-up, and treatment.   Reevaluation with update and discussion with   Considered admission: Admit for antibiotics and further workup  Disposition: Admission   Final Clinical Impression(s) / ED Diagnoses Final diagnoses:   Colitis  AKI (acute kidney injury) [facility])    Rx / [place] Orders ED Discharge Orders     None         Garald Balding, PA-C 03/01/22 1413    Leanord Asal K, DO 03/01/22 1426

## 2022-03-01 NOTE — Hospital Course (Addendum)
Ms. Dinora "Cyndee" Forget is a 66 yo F with a PMH follicular lymphoma (diagnosed 03/2021 status post chemotherapy rituximab/bendamustine with 18 mo of rituxan maintenance until 02/2021) c/b pneumonitis, achalasia s/p T2DM, and paroxysmal afib (rate controlled off AC since 12/10/2021 d/t being in NSR for over a month), and HLD.    Patient states that about a month prior to admission she developed loose stools after eating a spicy meal. She thought her diarrhea would eventually subside however she continued to have 10-18 bowel movements that appeared to have a greasy and green appearance. She did not notice any blood in her stools but recently she has noticed blood streaks on her toilet paper. The bleeding has been self limited and only about a dime size.   She also notes that she has had an intermittent headache, chills, night sweats, orthostasis, and fatigue. She denies any abdominal pain, nausea or vomiting.   She has had a poor appetite stating that she just does not feel like eating. She has no trouble swallowing but sometimes feels "repulsed" by food, even food she typically enjoys. She does state that when eating, she has a bowel movement soon after. Despite having a poor appetite, she has been able to drink fluids and does not have any issues with urinating.   She denies any sick contacts, recent antibiotic use, recent travel outside of the [place], has camped but only drank bottled or filtered water, and she has only pet dogs at home. She has no history of IBD/IBS but does have a sister with IBD.   Of note, she did mention melena during at 10/2021 appointment and was to schedule EGD and colonoscopy with Dr. Paulita Fujita of [facility], however it appears she was unable to do this.     PMH:  pAfib diagnosed 07/2021, AC stopped 8;/2023 due to being in NSR, metop XL '25mg'$  and cardizem '240mg'$   HLD crestor '10mg'$   T2DM, Levemir 15u in the AM, 15u humalog with breakfast and 15u with lunch, metformin  RLS,  benadryl Iron pills ILD, advair follows with Dr. Erin Fulling outpatient thought to be due to Rituxan. Has been on long steroid taper which was to end 03/07/2022    PSH:  Heller myotomy and partial fundoplication 03/03/4173  SH:  Lives with husband  No alcohol use, No tobacco use Hydrologist, has PhD in education, previously worked for [employer] in [place] area of [place]. 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    FH:  Sister has crohn's

## 2022-03-01 NOTE — ED Notes (Signed)
Husband at bedside.  

## 2022-03-01 NOTE — H&P (Addendum)
Date: 03/01/2022               Patient Name:  Meghan Alexander MRN: [id number]  DOB: [birth year] Age / Sex: 66 y.o., female   PCP: [name], MD         Medical Service: Internal Medicine Teaching Service         Attending Physician: Dr. Charise Killian, MD    First Contact: Dr. Linward Natal  Pager: [phone]  Second Contact: Dr. Farrel Gordon  Pager: [phone]       After Hours (After 5p/  First Contact Pager: [phone]  weekends / holidays): Second Contact Pager: [phone]   Chief Complaint: persistent diarrhea   History of Present Illness:   Meghan Alexander is a 66 yo F with a PMH follicular lymphoma (diagnosed 03/2021 status post chemotherapy rituximab/bendamustine with 18 mo of rituxan maintenance until 02/2021) c/b pneumonitis, achalasia s/p T2DM, and paroxysmal afib (rate controlled off AC since 12/10/2021 d/t being in NSR for over a month), and HLD.    Patient states that about a month prior to admission she developed loose stools after eating a spicy meal. She thought her diarrhea would eventually subside however she continued to have 10-18 bowel movements that appeared to have a greasy and green appearance. She did not notice any blood in her stools but recently she has noticed blood streaks on her toilet paper. The bleeding has been self limited and only about a dime size.   She also notes that she has had an intermittent headache, chills, night sweats, orthostasis, and fatigue. She denies any abdominal pain, nausea or vomiting.   She has had a poor appetite stating that she just does not feel like eating. She has no trouble swallowing but sometimes feels "repulsed" by food, even food she typically enjoys. She does state that when eating, she has a bowel movement soon after. Despite having a poor appetite, she has been able to drink fluids and does not have any issues with urinating.   She denies any sick contacts, recent antibiotic use, recent travel outside of the  [place], has camped but only drank bottled or filtered water, and she has only pet dogs at home. She has no history of IBD/IBS but does have a sister with IBD.   Of note, she did mention melena during at 10/2021 appointment and was to schedule EGD and colonoscopy with Dr. Paulita Fujita of [facility], however it appears she was unable to do this.     PMH:  pAfib diagnosed 07/2021, AC stopped 8;/2023 due to being in NSR, metop XL '25mg'$  and cardizem '240mg'$   HLD crestor '10mg'$   T2DM, Levemir 15u in the AM, 15u humalog with breakfast and 15u with lunch, metformin  RLS, benadryl Iron pills ILD, advair follows with Dr. Erin Fulling outpatient thought to be due to Rituxan. Has been on long steroid taper which was to end 03/07/2022    PSH:  Heller myotomy and partial fundoplication 32/09/7122  SH:  Lives with husband  No alcohol use, No tobacco use Hydrologist, has PhD in education, previously worked for [employer] in [place] area of [place]. 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    FH:  Sister has crohn's   Meds:  Current Meds  Medication Sig   diltiazem (CARDIZEM CD) 240 MG 24 hr capsule Take 1 capsule (240 mg total) by mouth daily.   diphenhydramine-acetaminophen (TYLENOL  PM) 25-500 MG TABS tablet Take 1 tablet by mouth at bedtime as needed (pain/sleep).   ferrous sulfate 325 (65 FE) MG tablet Take 1 tablet (325 mg total) by mouth every other day. (Patient taking differently: Take 325 mg by mouth daily.)   fluticasone-salmeterol (ADVAIR HFA) 115-21 MCG/ACT inhaler Inhale 2 puffs into the lungs 2 (two) times daily. (Patient taking differently: Inhale 2 puffs into the lungs daily.)   HUMALOG KWIKPEN 100 UNIT/ML KwikPen Inject 5 Units into the skin with breakfast, with lunch, and with evening meal. (Patient taking differently: Inject 15 Units into the skin 2 (two) times daily.)   insulin detemir (LEVEMIR) 100 UNIT/ML FlexPen  Inject 10 Units into the skin daily.   losartan (COZAAR) 50 MG tablet Take 25 mg by mouth daily.   Melatonin 10 MG CAPS Take 10 mg by mouth at bedtime.    meloxicam (MOBIC) 15 MG tablet Take 15 mg by mouth daily.   metFORMIN (GLUCOPHAGE) 500 MG tablet Take 500 mg by mouth daily with breakfast.   metoprolol succinate (TOPROL-XL) 25 MG 24 hr tablet Take 1 tablet (25 mg total) by mouth daily.   Multiple Vitamin (MULTIVITAMIN) tablet Take 1 tablet by mouth daily.   omeprazole (PRILOSEC) 20 MG capsule Take 20 mg by mouth daily.   rosuvastatin (CRESTOR) 10 MG tablet Take 10 mg by mouth daily.   venlafaxine XR (EFFEXOR-XR) 37.5 MG 24 hr capsule Take 37.5 mg by mouth daily with breakfast.     Allergies: Allergies as of 02/28/2022 - Review Complete 02/28/2022  Allergen Reaction Noted   Latex Other (See Comments) 10/17/2014   Codeine Nausea And Vomiting and Other (See Comments) 08/16/2011   Atenolol Cough 07/08/2020   Other Other (See Comments) 12/14/2018   Hydrocodone Nausea And Vomiting 08/16/2011   Oxycodone Nausea And Vomiting 08/16/2011   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 ([place]) 04/02/2019   GERD (gastroesophageal reflux disease)    hx of reflux-went away with elevating HOB    Hypertension    Pneumonia    hx of    PONV (postoperative nausea and vomiting)    Type 2 diabetes mellitus ([place]) 08/02/2021    Family History:  Family History  Problem Relation Age of Onset   Lung cancer Father        07-12-1980 died from it   Hernia Mother    Breast cancer Maternal Aunt        multiple    Breast cancer Maternal Grandmother    Breast cancer Paternal Grandmother    Breast cancer Cousin        cousins multiple    Colon cancer Neg Hx    Colon polyps Neg Hx    Rectal cancer Neg Hx     Stomach cancer Neg Hx      Social History:  Social History   Socioeconomic History   Marital status: Married    Spouse name: Not on file   Number of children: Not on file   Years of education: Not on file   Highest education level: Not on file  Occupational History   Not on file  Tobacco Use   Smoking status: Never   Smokeless tobacco: Never  Vaping Use   Vaping Use:  Never used  Substance and Sexual Activity   Alcohol use: No    Alcohol/week: 0.0 standard drinks of alcohol   Drug use: No   Sexual activity: Not on file  Other Topics Concern   Not on file  Social History Narrative   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   Physical Exam: Blood pressure 134/66, pulse (!) 117, temperature 98.2 F (36.8 C), temperature source Oral, resp. rate (!) 22, height '5\' 2"'$  (1.575 m), weight 72.6 kg, SpO2 96 %.  Constitutional: Well-developed, well-nourished, and in no distress.  HENT:  Head: Normocephalic and atraumatic.  Eyes: EOM are normal.  Neck: Normal range of motion.  Cardiovascular: Normal rate, regular rhythm, intact distal pulses. No gallop and no friction rub.  No murmur heard. No lower extremity edema  Pulmonary: Non labored breathing on room air, no wheezing or rales  Abdominal: Soft. Normal bowel sounds. Non distended and non tender Musculoskeletal: Normal range of motion.        General: No tenderness or edema.  Neurological: Alert and oriented to person, place, and time.  Non focal  Skin: Skin is warm and dry.    EKG: personally reviewed my interpretation is sinus tachycardia.   CT A/P:  IMPRESSION: 1. Mild diffuse colonic wall thickening and mucosal enhancement, possible infectious or inflammatory colitis. 2. Diverticulosis without diverticulitis. 3. Aortic atherosclerosis.  Assessment & Plan by Problem: Principal Problem:   Chronic diarrhea of unknown origin  Meghan Alexander is a 66 yo F with a PMH follicular lymphoma (diagnosed 03/2021 status post chemotherapy rituximab/bendamustine with 18 mo of rituxan maintenance until 02/2021) c/b pneumonitis, achalasia s/p Heller myotomy, T2DM, and paroxysmal afib (rate controlled off AC since 12/10/2021 d/t being in NSR for over a month), and HLD who presents with chronic diarrhea found to be febrile with leukocytosis to 18.7 and colitis on CT A/P.   #Chronic diarrhea #?Infectious colitis Patient with chronic diarrhea, 10-18 loose stools over the past month. She had a fever to 100.25F on presentation with no apparent weight loss per chart review. She also had a leukocytosis to 13.2 (neutrophilic predominance with elevated immature granulocytes), and she had a colitis noted on CT A/P.   Given the leukocytosis with elevated immature granulocytes and fever it is possible that patient's symptoms are secondary to an infectious colitis. She is c diff negative. The rest of her GI pathogen panel is pending. She did receive ciprofloxacin and flagyl in the ED.   Also considered inflammatory cause of her chronic diarrhea. She does have a first degree relative with UC and IBD sometimes presents in a bimodal distribution.  Considered celiac disease, pancreatitic insufficiency, and microscopic colitis as well.   TSH is WNL and FT4 is only mildly elevated ruling out hyperthyroidism as potential etiology.  -F/u celiac serologies. Fecal lactoferrin for possible IBD. And fecal elastase.  -Continue IV fluids, and  antibiotics -If work up is unrevealing  may require GI consultation   #AKI Likely AKI. S/p 1L of IV fluids in the ED. Will give patient additional 1L bolus.  -F/u AM BMP, bolus as needed given inability of patient to keep up with GI losses   #T2DM  Continue insulin, semeglee 10u qhs +SSI given minimal PO intake. Her blood sugars seem to be mostly normal with no hypoglycemic episodes recently on her freestyle libre on a more aggressive regimen. 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 Imaging with no LN. Last saw oncology 11/16/21. Finished her last dose of chemotherapy 02/2021. Patient was to complete steroid taper 11/13 per EMR, but she states she completed this last week. Her last dose was 2.'5mg'$  and patient does not note any dyspnea or chest pain.  -Will hold on continuing further steroids for now, given she is asymptomatic from this standpoint  #Pafib  AC stopped as she has mostly been in NSR. Sinus tachycardia on EKG.  -Continue cardizem and metoprolol  #HTN Hold home losartan in the setting of her AKI.   Dispo: Admit patient to Inpatient with expected length of stay greater than 2 midnights.  Signed: Rick Duff, MD 03/01/2022, 3:04 PM  After 5pm on weekdays and 1pm on weekends: On Call pager: [phone]

## 2022-03-02 ENCOUNTER — Other Ambulatory Visit (HOSPITAL_COMMUNITY): Payer: Self-pay

## 2022-03-02 DIAGNOSIS — K529 Noninfective gastroenteritis and colitis, unspecified: Secondary | ICD-10-CM | POA: Diagnosis not present

## 2022-03-02 LAB — GASTROINTESTINAL PANEL BY PCR, STOOL (REPLACES STOOL CULTURE)

## 2022-03-02 LAB — COMPREHENSIVE METABOLIC PANEL
ALT: 15 U/L (ref 0–44)
AST: 11 U/L — ABNORMAL LOW (ref 15–41)
Albumin: 2.1 g/dL — ABNORMAL LOW (ref 3.5–5.0)
Alkaline Phosphatase: 58 U/L (ref 38–126)
Anion gap: 8 (ref 5–15)
BUN: 16 mg/dL (ref 8–23)
CO2: 24 mmol/L (ref 22–32)
Calcium: 8 mg/dL — ABNORMAL LOW (ref 8.9–10.3)
Chloride: 104 mmol/L (ref 98–111)
Creatinine, Ser: 1.06 mg/dL — ABNORMAL HIGH (ref 0.44–1.00)
GFR, Estimated: 57 mL/min — ABNORMAL LOW (ref >60)
Glucose, Bld: 96 mg/dL (ref 70–99)
Potassium: 4 mmol/L (ref 3.5–5.1)
Sodium: 136 mmol/L (ref 135–145)
Total Bilirubin: 0.8 mg/dL (ref 0.3–1.2)
Total Protein: 4.7 g/dL — ABNORMAL LOW (ref 6.5–8.1)

## 2022-03-02 LAB — CBC
HCT: 29.7 % — ABNORMAL LOW (ref 36.0–46.0)
Hemoglobin: 10.1 g/dL — ABNORMAL LOW (ref 12.0–15.0)
MCH: 27.9 pg (ref 26.0–34.0)
MCHC: 34 g/dL (ref 30.0–36.0)
MCV: 82 fL (ref 80.0–100.0)
Platelets: 281 10*3/uL (ref 150–400)
RBC: 3.62 MIL/uL — ABNORMAL LOW (ref 3.87–5.11)
RDW: 15.8 % — ABNORMAL HIGH (ref 11.5–15.5)
WBC: 9.8 10*3/uL (ref 4.0–10.5)
nRBC: 0 % (ref 0.0–0.2)

## 2022-03-02 LAB — CBG MONITORING, ED
Glucose-Capillary: 147 mg/dL — ABNORMAL HIGH (ref 70–99)
Glucose-Capillary: 157 mg/dL — ABNORMAL HIGH (ref 70–99)

## 2022-03-02 LAB — T3, FREE: T3, Free: 2.3 pg/mL (ref 2.0–4.4)

## 2022-03-02 MED ORDER — CIPROFLOXACIN HCL 500 MG PO TABS
500.0000 mg | ORAL_TABLET | Freq: Two times a day (BID) | ORAL | 0 refills | Status: DC
  Filled 2022-03-02: qty 7, 4d supply, fill #0

## 2022-03-02 MED ORDER — LOPERAMIDE HCL 2 MG PO CAPS
2.0000 mg | ORAL_CAPSULE | ORAL | 0 refills | Status: DC | PRN
  Filled 2022-03-02: qty 30, 5d supply, fill #0

## 2022-03-02 MED ORDER — LOPERAMIDE HCL 2 MG PO CAPS: 2.0000 mg | ORAL_CAPSULE | ORAL | Status: DC | PRN

## 2022-03-02 MED ORDER — CIPROFLOXACIN HCL 500 MG PO TABS
500.0000 mg | ORAL_TABLET | Freq: Two times a day (BID) | ORAL | Status: DC
  Administered 2022-03-02: 500 mg via ORAL
  Filled 2022-03-02: qty 1

## 2022-03-02 NOTE — ED Notes (Signed)
Did EKG patient is resting with family at bedside

## 2022-03-02 NOTE — Care Management Obs Status (Signed)
MEDICARE OBSERVATION STATUS NOTIFICATION   Patient Details  Name: Ethleen Lormand MRN: [id number] Date of Birth: 03-10-1956   Medicare Observation Status Notification Given:  Yes    Fuller Mandril, RN 03/02/2022, 3:49 PM

## 2022-03-02 NOTE — ED Notes (Signed)
Pt in hall. A&O x4. Denies any needs at this time. Updated on plan of care.

## 2022-03-02 NOTE — Discharge Summary (Signed)
Name: Meghan Alexander MRN: [id number] DOB: [birth year] 66 y.o. PCP: [name], MD  Date of Admission: 02/28/2022  8:16 PM Date of Discharge: 03/02/22 Attending Physician: Charise Killian, MD  Discharge Diagnosis: 1. Principal Problem:   Chronic diarrhea of unknown origin Active Problems:   Chronic diarrhea  Discharge Medications: Allergies as of 03/02/2022       Reactions   Latex Other (See Comments)   Blisters / skin peeling   Codeine Nausea And Vomiting, Other (See Comments)   hallucinations   Atenolol Cough   Other Other (See Comments)   DermaPlast; Used after surgery - caused swelling, itching, and wound broke open   Hydrocodone Nausea And Vomiting   Oxycodone Nausea And Vomiting        Medication List     STOP taking these medications    feeding supplement Liqd   predniSONE 10 MG tablet Commonly known as: DELTASONE   predniSONE 5 MG tablet Commonly known as: DELTASONE       TAKE these medications    Accu-Chek Guide test strip Generic drug: glucose blood Use to test blood sugars up to 4 times daily as directed.   Accu-Chek Guide w/Device Kit Use to test blood sugars up to 4 times daily.   Accu-Chek Softclix Lancets lancets Use to test blood sugars up to 4 times daily as directed.   Cartia XT 240 MG 24 hr capsule Generic drug: diltiazem Take 1 capsule (240 mg total) by mouth daily.   ciprofloxacin 500 MG tablet Commonly known as: CIPRO Take 1 tablet (500 mg total) by mouth 2 (two) times daily.   diphenhydramine-acetaminophen 25-500 MG Tabs tablet Commonly known as: TYLENOL PM Take 1 tablet by mouth at bedtime as needed (pain/sleep).   ferrous sulfate 325 (65 FE) MG tablet Take 1 tablet (325 mg total) by mouth every other day. What changed: when to take this   fluticasone-salmeterol 115-21 MCG/ACT inhaler Commonly known as: Advair HFA Inhale 2 puffs into the lungs 2 (two) times daily. What changed: when to take this   HumaLOG KwikPen  100 UNIT/ML KwikPen Generic drug: insulin lispro Inject 5 Units into the skin with breakfast, with lunch, and with evening meal. What changed:  how much to take when to take this   insulin detemir 100 UNIT/ML FlexPen Commonly known as: LEVEMIR Inject 10 Units into the skin daily.   loperamide 2 MG capsule Commonly known as: IMODIUM Take 1 capsule (2 mg total) by mouth as needed for diarrhea or loose stools. Not to exceed 8 capsules per day   losartan 50 MG tablet Commonly known as: COZAAR Take 25 mg by mouth daily.   Melatonin 10 MG Caps Take 10 mg by mouth at bedtime.   meloxicam 15 MG tablet Commonly known as: MOBIC Take 15 mg by mouth daily.   metFORMIN 500 MG tablet Commonly known as: GLUCOPHAGE Take 500 mg by mouth daily with breakfast.   metoprolol succinate 25 MG 24 hr tablet Commonly known as: TOPROL-XL Take 1 tablet (25 mg total) by mouth daily.   multivitamin tablet Take 1 tablet by mouth daily.   omeprazole 20 MG capsule Commonly known as: PRILOSEC Take 20 mg by mouth daily.   Pentips 32G X 4 MM Misc Generic drug: Insulin Pen Needle Use to inject insulin up to 4 times daily as directed.   rosuvastatin 10 MG tablet Commonly known as: CRESTOR Take 10 mg by mouth daily.   venlafaxine XR 37.5 MG 24 hr capsule Commonly known  as: EFFEXOR-XR Take 37.5 mg by mouth daily with breakfast.        Disposition and follow-up:   Ms.Kang Filice was discharged from [facility] in Stable condition.  At the hospital follow up visit please address:  Diarrhea: GI panel positive for EPEC. Please ensure completes 5 day course ciprofloxacin 500 mg bid and that symptoms continue to improve (poor po intake, diarrhea). F/u leukocytosis with CBC per provider discretion.  AKI: Cr improved with fluids. Please repeat BMP to ensure resolution/return to baseline.  2.  Labs / imaging needed at time of follow-up: BMP, CBC per provider discretion  3.   Pending labs/ test needing follow-up: TTG IgA, Iron and TIBC, Ferritin, Lactoferrin, pancreatic elastase  Follow-up Appointments:   Hospital Course by problem list:  #Diarrhea  The patient presented to the ED on 11/6 endorsing approximately 1 month of non-bloody diarrhea since January 28, 2022. She reported 10-18 episodes of diarrhea daily that began after eating at a [employer]. She was febrile to 100.8 and tachycardia of 124, but other vitals were within normal limits. In the ED, her CBC showed a leukocytosis of 24.0 with neutrophilic predominance and CT showing mild diffuse colonic wall thickening and mucosal enhancement, possible infectious or inflammatory colitis. She received two 1 L fluid boluses, Tylenol and Zofran. She was also given one dose of Cipro  400 mg IV and Flagyl 500 mg IV, while GI pathogen panel and C. Diff panel was pending. On admission day two, her GI pathogen panel was positive for enteropathogen e coli. Flagyl was discontinued. At discharge, her abdominal pain and her stool consistency had improved. Will discharge to complete 5 day total course of abx with ciprofloxacin 500 mg bid.   #AKI Likely prerenal due to poor po intake and diarrhea.The patient was also found to have a creatinine of 1.42, suggesting AKI. After two 1 L fluid boluses, this improved to 1.06.   Discharge Subjective: The patient reports the same frequency of diarrhea, with some more solid bowel movements yesterday. She has had four episodes of diarrhea today, and some intermittent abdominal pain. She has been able to tolerate PO without nausea and vomiting, but notes some decreased appetite. Denies bloody stools.    Discharge Exam:   BP 110/60 (BP Location: Left Arm)   Pulse 91   Temp 98 F (36.7 C)   Resp 18   Ht _0  (1.575 m)   Wt 72.6 kg   SpO2 90%   BMI 29.26 kg/m  Discharge exam:   General: Well-appearing; No acute distress. Cardiovascular: Regular rate and rhythm; No murmurs,  rubs, or gallops; 2+ radial and dp pulses bilaterally; capillary refill <2 seconds. Pulmonary: Normal respiratory effort; Lungs clear to auscultation bilaterally; No wheezes, rales, or rhonchi. Abdomen: Soft, non-distended, non-tender; No masses or organomegaly. Extremities: No lower extremity edema   Pertinent Labs, Studies, and Procedures:   CT ABDOMEN PELVIS W CONTRAST  Result Date: 03/01/2022 CLINICAL DATA:  Diarrhea with nausea and vomiting. EXAM: CT ABDOMEN AND PELVIS WITH CONTRAST TECHNIQUE: Multidetector CT imaging of the abdomen and pelvis was performed using the standard protocol following bolus administration of intravenous contrast. RADIATION DOSE REDUCTION: This exam was performed according to the departmental dose-optimization program which includes automated exposure control, adjustment of the mA and/or kV according to patient size and/or use of iterative reconstruction technique. CONTRAST:  72m OMNIPAQUE IOHEXOL 350 MG/ML SOLN COMPARISON:  07/14/2021. FINDINGS: Lower chest: Mild atelectasis or scarring at the lung bases. Hepatobiliary:  Cysts and calcified granuloma are noted in the liver. No biliary ductal dilatation. The gallbladder is without stones. Pancreas: Unremarkable. No pancreatic ductal dilatation or surrounding inflammatory changes. Spleen: Normal size.  Calcified granuloma are noted. Adrenals/Urinary Tract: Adrenal glands are stable. The kidneys enhance symmetrically. No renal calculus or hydronephrosis. The bladder is unremarkable. Stomach/Bowel: In the distal esophagus is patulous and unchanged from the prior exam. Stomach is within normal limits. Appendix appears normal. No evidence of bowel wall thickening, distention, or inflammatory changes. No free air or pneumatosis. A few scattered diverticula are noted along the colon without evidence of diverticulitis. There is mild diffuse colonic wall thickening with enhancement. Vascular/Lymphatic: Aortic atherosclerosis. No  enlarged abdominal or pelvic lymph nodes. Reproductive: Status post hysterectomy. No adnexal masses. Other: Small fat containing periumbilical hernia. No abdominopelvic ascites. Musculoskeletal: Bilateral breast implants are noted. There are degenerative changes in the thoracolumbar spine. No acute osseous abnormality. IMPRESSION: 1. Mild diffuse colonic wall thickening and mucosal enhancement, possible infectious or inflammatory colitis. 2. Diverticulosis without diverticulitis. 3. Aortic atherosclerosis. Electronically Signed   By: Brett Fairy M.D.   On: 03/01/2022 02:01   DG Abdomen 1 View  Result Date: 02/28/2022 CLINICAL DATA:  Vomiting and watery diarrhea. EXAM: ABDOMEN - 1 VIEW COMPARISON:  None Available. FINDINGS: The bowel gas pattern is normal. Radiopaque surgical clips are seen overlying the medial aspect of the left upper quadrant. No radio-opaque calculi or other significant radiographic abnormality are seen. IMPRESSION: Nonobstructive bowel gas pattern. Electronically Signed   By: Virgina Norfolk M.D.   On: 02/28/2022 21:26       Latest Ref Rng & Units 03/02/2022    4:21 AM 03/01/2022    3:42 PM 02/28/2022    8:50 PM  CBC  WBC 4.0 - 10.5 K/uL 9.8  10.6  18.7   Hemoglobin 12.0 - 15.0 g/dL 10.1  11.5  11.9   Hematocrit 36.0 - 46.0 % 29.7  34.2  36.8   Platelets 150 - 400 K/uL 281  326  364        Latest Ref Rng & Units 03/02/2022    4:21 AM 03/01/2022    3:42 PM 02/28/2022    8:50 PM  CMP  Glucose 70 - 99 mg/dL 96  144  160   BUN 8 - 23 mg/dL _0 Creatinine 0.44 - 1.00 mg/dL 1.06  1.24  1.42   Sodium 135 - 145 mmol/L 136  136  131   Potassium 3.5 - 5.1 mmol/L 4.0  3.9  4.6   Chloride 98 - 111 mmol/L 104  100  95   CO2 22 - 32 mmol/L _1 Calcium 8.9 - 10.3 mg/dL 8.0  8.7  8.6   Total Protein 6.5 - 8.1 g/dL 4.7   6.0   Total Bilirubin 0.3 - 1.2 mg/dL 0.8   0.7   Alkaline Phos 38 - 126 U/L 58   77   AST 15 - 41 U/L 11   13   ALT 0 - 44 U/L 15   16       Discharge Instructions: Discharge Instructions     Call MD for:   Complete by: As directed    -Inability to keep food and fluids down -Worsening diarrhea despite taking the antibiotics and imodium -Recurrent fever   Call MD for:  temperature >100.4   Complete by: As directed    Diet - low sodium heart healthy  Complete by: As directed    Discharge instructions   Complete by: As directed    Mrs. Sellick,  It was a pleasure to care for you during your hospitalization at [facility]. We found that you have a colon infection and have started you on an antibiotic called ciprofloxacin: you will take 500 mg twice daily for 3 more days. You will need to take a dose tonight as well. I have also sent in Imodium for symptom management. You should not take more than 4 capsules per day.  I recommend that you follow up with your primary care doctor in the next 1 week or so.   My best, Dr. Marlou Sa   Increase activity slowly   Complete by: As directed        Signed: Corene Cornea 03/02/2022  Attestation for Student Documentation:  I personally was present and performed or re-performed the history, physical exam and medical decision-making activities of this service and have verified that the service and findings are accurately documented in the student's note.  Linward Natal, MD 03/02/2022, 3:50 PM

## 2022-03-02 NOTE — Care Management CC44 (Signed)
Condition Code 44 Documentation Completed  Patient Details  Name: Meghan Alexander MRN: [id number] Date of Birth: Sep 19, 1955   Condition Code 44 given:  Yes Patient signature on Condition Code 44 notice:  Yes Documentation of 2 MD's agreement:  Yes Code 44 added to claim:  Yes    Fuller Mandril, RN 03/02/2022, 3:49 PM

## 2022-03-03 LAB — TISSUE TRANSGLUTAMINASE, IGG: Tissue Transglut Ab: <2 U/mL (ref 0–5)

## 2022-03-03 LAB — TISSUE TRANSGLUTAMINASE, IGA: Tissue Transglutaminase Ab, IgA: <2 U/mL (ref 0–3)

## 2022-03-04 LAB — PANCREATIC ELASTASE, FECAL: Pancreatic Elastase-1, Stool: 270 ug Elast./g (ref >200)

## 2022-03-07 ENCOUNTER — Other Ambulatory Visit: Payer: Self-pay

## 2022-03-07 ENCOUNTER — Inpatient Hospital Stay (HOSPITAL_COMMUNITY)
Admission: EM | Admit: 2022-03-07 | Discharge: 2022-03-22 | DRG: 372 | Disposition: A | Discharge status: Home / Self Care | Payer: Medicare Other | Source: Ambulatory Visit | Attending: Internal Medicine | Admitting: Internal Medicine

## 2022-03-07 ENCOUNTER — Observation Stay (HOSPITAL_COMMUNITY): Payer: Medicare Other

## 2022-03-07 DIAGNOSIS — A040 Enteropathogenic Escherichia coli infection: Secondary | ICD-10-CM | POA: Diagnosis not present

## 2022-03-07 DIAGNOSIS — E8809 Other disorders of plasma-protein metabolism, not elsewhere classified: Secondary | ICD-10-CM | POA: Diagnosis present

## 2022-03-07 DIAGNOSIS — Z79899 Other long term (current) drug therapy: Secondary | ICD-10-CM

## 2022-03-07 DIAGNOSIS — E1165 Type 2 diabetes mellitus with hyperglycemia: Secondary | ICD-10-CM | POA: Diagnosis present

## 2022-03-07 DIAGNOSIS — D62 Acute posthemorrhagic anemia: Secondary | ICD-10-CM | POA: Diagnosis not present

## 2022-03-07 DIAGNOSIS — R197 Diarrhea, unspecified: Secondary | ICD-10-CM

## 2022-03-07 DIAGNOSIS — Z888 Allergy status to other drugs, medicaments and biological substances status: Secondary | ICD-10-CM

## 2022-03-07 DIAGNOSIS — Z8572 Personal history of non-Hodgkin lymphomas: Secondary | ICD-10-CM

## 2022-03-07 DIAGNOSIS — K644 Residual hemorrhoidal skin tags: Secondary | ICD-10-CM | POA: Diagnosis present

## 2022-03-07 DIAGNOSIS — J704 Drug-induced interstitial lung disorders, unspecified: Secondary | ICD-10-CM | POA: Diagnosis present

## 2022-03-07 DIAGNOSIS — Z794 Long term (current) use of insulin: Secondary | ICD-10-CM

## 2022-03-07 DIAGNOSIS — I480 Paroxysmal atrial fibrillation: Secondary | ICD-10-CM | POA: Diagnosis present

## 2022-03-07 DIAGNOSIS — E872 Acidosis, unspecified: Secondary | ICD-10-CM | POA: Diagnosis present

## 2022-03-07 DIAGNOSIS — A09 Infectious gastroenteritis and colitis, unspecified: Secondary | ICD-10-CM | POA: Diagnosis not present

## 2022-03-07 DIAGNOSIS — Z7984 Long term (current) use of oral hypoglycemic drugs: Secondary | ICD-10-CM

## 2022-03-07 DIAGNOSIS — Z885 Allergy status to narcotic agent status: Secondary | ICD-10-CM

## 2022-03-07 DIAGNOSIS — E860 Dehydration: Secondary | ICD-10-CM | POA: Diagnosis present

## 2022-03-07 DIAGNOSIS — T451X5D Adverse effect of antineoplastic and immunosuppressive drugs, subsequent encounter: Secondary | ICD-10-CM

## 2022-03-07 DIAGNOSIS — Z803 Family history of malignant neoplasm of breast: Secondary | ICD-10-CM

## 2022-03-07 DIAGNOSIS — F32A Depression, unspecified: Secondary | ICD-10-CM | POA: Diagnosis present

## 2022-03-07 DIAGNOSIS — D75838 Other thrombocytosis: Secondary | ICD-10-CM | POA: Diagnosis present

## 2022-03-07 DIAGNOSIS — E861 Hypovolemia: Secondary | ICD-10-CM | POA: Diagnosis present

## 2022-03-07 DIAGNOSIS — Z91040 Latex allergy status: Secondary | ICD-10-CM

## 2022-03-07 DIAGNOSIS — R000 Tachycardia, unspecified: Secondary | ICD-10-CM

## 2022-03-07 DIAGNOSIS — K648 Other hemorrhoids: Secondary | ICD-10-CM | POA: Diagnosis present

## 2022-03-07 DIAGNOSIS — Z801 Family history of malignant neoplasm of trachea, bronchus and lung: Secondary | ICD-10-CM

## 2022-03-07 DIAGNOSIS — E876 Hypokalemia: Secondary | ICD-10-CM | POA: Diagnosis present

## 2022-03-07 DIAGNOSIS — I10 Essential (primary) hypertension: Secondary | ICD-10-CM | POA: Diagnosis present

## 2022-03-07 DIAGNOSIS — D6489 Other specified anemias: Secondary | ICD-10-CM | POA: Diagnosis present

## 2022-03-07 DIAGNOSIS — K633 Ulcer of intestine: Secondary | ICD-10-CM | POA: Diagnosis present

## 2022-03-07 DIAGNOSIS — N179 Acute kidney failure, unspecified: Secondary | ICD-10-CM

## 2022-03-07 DIAGNOSIS — K219 Gastro-esophageal reflux disease without esophagitis: Secondary | ICD-10-CM | POA: Diagnosis present

## 2022-03-07 LAB — COMPREHENSIVE METABOLIC PANEL
ALT: 21 U/L (ref 0–44)
AST: 20 U/L (ref 15–41)
Albumin: 1.8 g/dL — ABNORMAL LOW (ref 3.5–5.0)
Alkaline Phosphatase: 75 U/L (ref 38–126)
Anion gap: 15 (ref 5–15)
BUN: 21 mg/dL (ref 8–23)
CO2: 23 mmol/L (ref 22–32)
Calcium: 8.2 mg/dL — ABNORMAL LOW (ref 8.9–10.3)
Chloride: 92 mmol/L — ABNORMAL LOW (ref 98–111)
Creatinine, Ser: 1.58 mg/dL — ABNORMAL HIGH (ref 0.44–1.00)
GFR, Estimated: 35 mL/min — ABNORMAL LOW (ref >60)
Glucose, Bld: 217 mg/dL — ABNORMAL HIGH (ref 70–99)
Potassium: 4.3 mmol/L (ref 3.5–5.1)
Sodium: 130 mmol/L — ABNORMAL LOW (ref 135–145)
Total Bilirubin: 0.7 mg/dL (ref 0.3–1.2)
Total Protein: 5.3 g/dL — ABNORMAL LOW (ref 6.5–8.1)

## 2022-03-07 LAB — CBC WITH DIFFERENTIAL/PLATELET
Abs Immature Granulocytes: 0.45 10*3/uL — ABNORMAL HIGH (ref 0.00–0.07)
Basophils Absolute: 0 10*3/uL (ref 0.0–0.1)
Basophils Relative: 0 %
Eosinophils Absolute: 0.1 10*3/uL (ref 0.0–0.5)
Eosinophils Relative: 1 %
HCT: 29.1 % — ABNORMAL LOW (ref 36.0–46.0)
Hemoglobin: 9.7 g/dL — ABNORMAL LOW (ref 12.0–15.0)
Immature Granulocytes: 4 %
Lymphocytes Relative: 4 %
Lymphs Abs: 0.4 10*3/uL — ABNORMAL LOW (ref 0.7–4.0)
MCH: 28.2 pg (ref 26.0–34.0)
MCHC: 33.3 g/dL (ref 30.0–36.0)
MCV: 84.6 fL (ref 80.0–100.0)
Monocytes Absolute: 1.5 10*3/uL — ABNORMAL HIGH (ref 0.1–1.0)
Monocytes Relative: 13 %
Neutro Abs: 9.3 10*3/uL — ABNORMAL HIGH (ref 1.7–7.7)
Neutrophils Relative %: 78 %
Platelets: 421 10*3/uL — ABNORMAL HIGH (ref 150–400)
RBC: 3.44 MIL/uL — ABNORMAL LOW (ref 3.87–5.11)
RDW: 16.4 % — ABNORMAL HIGH (ref 11.5–15.5)
WBC: 11.7 10*3/uL — ABNORMAL HIGH (ref 4.0–10.5)
nRBC: 0 % (ref 0.0–0.2)

## 2022-03-07 LAB — MAGNESIUM: Magnesium: 2.1 mg/dL (ref 1.7–2.4)

## 2022-03-07 LAB — LACTIC ACID, PLASMA
Lactic Acid, Venous: 1.6 mmol/L (ref 0.5–1.9)
Lactic Acid, Venous: 1.1 mmol/L (ref 0.5–1.9)

## 2022-03-07 LAB — CBG MONITORING, ED: Glucose-Capillary: 161 mg/dL — ABNORMAL HIGH (ref 70–99)

## 2022-03-07 MED ORDER — SODIUM CHLORIDE 0.9 % IV BOLUS
1000.0000 mL | Freq: Once | INTRAVENOUS | Status: AC
  Administered 2022-03-07: 1000 mL via INTRAVENOUS

## 2022-03-07 MED ORDER — DILTIAZEM HCL ER COATED BEADS 240 MG PO CP24
240.0000 mg | ORAL_CAPSULE | Freq: Every day | ORAL | Status: DC
  Administered 2022-03-08 – 2022-03-22 (×15): 240 mg via ORAL
  Filled 2022-03-07 (×15): qty 1

## 2022-03-07 MED ORDER — INSULIN ASPART 100 UNIT/ML IJ SOLN
0.0000 [IU] | Freq: Three times a day (TID) | INTRAMUSCULAR | Status: DC
  Administered 2022-03-08 (×2): 3 [IU] via SUBCUTANEOUS
  Administered 2022-03-09: 5 [IU] via SUBCUTANEOUS
  Administered 2022-03-09: 2 [IU] via SUBCUTANEOUS
  Administered 2022-03-10 (×2): 3 [IU] via SUBCUTANEOUS
  Administered 2022-03-11: 5 [IU] via SUBCUTANEOUS
  Administered 2022-03-11 (×2): 2 [IU] via SUBCUTANEOUS
  Administered 2022-03-12: 5 [IU] via SUBCUTANEOUS
  Administered 2022-03-12: 2 [IU] via SUBCUTANEOUS
  Administered 2022-03-12 – 2022-03-14 (×6): 3 [IU] via SUBCUTANEOUS
  Administered 2022-03-15: 2 [IU] via SUBCUTANEOUS
  Administered 2022-03-15: 3 [IU] via SUBCUTANEOUS
  Administered 2022-03-15: 2 [IU] via SUBCUTANEOUS
  Administered 2022-03-16: 3 [IU] via SUBCUTANEOUS
  Administered 2022-03-16: 2 [IU] via SUBCUTANEOUS
  Administered 2022-03-16: 5 [IU] via SUBCUTANEOUS
  Administered 2022-03-17: 3 [IU] via SUBCUTANEOUS
  Administered 2022-03-17: 2 [IU] via SUBCUTANEOUS
  Administered 2022-03-17 – 2022-03-18 (×2): 5 [IU] via SUBCUTANEOUS
  Administered 2022-03-18: 3 [IU] via SUBCUTANEOUS
  Administered 2022-03-19: 11 [IU] via SUBCUTANEOUS
  Administered 2022-03-19: 8 [IU] via SUBCUTANEOUS
  Administered 2022-03-19: 5 [IU] via SUBCUTANEOUS
  Administered 2022-03-20 – 2022-03-21 (×5): 3 [IU] via SUBCUTANEOUS
  Administered 2022-03-21 – 2022-03-22 (×3): 5 [IU] via SUBCUTANEOUS

## 2022-03-07 MED ORDER — MOMETASONE FURO-FORMOTEROL FUM 200-5 MCG/ACT IN AERO: 2.0000 | INHALATION_SPRAY | Freq: Two times a day (BID) | RESPIRATORY_TRACT | Status: DC

## 2022-03-07 MED ORDER — LACTATED RINGERS IV BOLUS
1000.0000 mL | Freq: Once | INTRAVENOUS | Status: AC
  Administered 2022-03-07: 1000 mL via INTRAVENOUS

## 2022-03-07 MED ORDER — INSULIN GLARGINE-YFGN 100 UNIT/ML ~~LOC~~ SOLN
10.0000 [IU] | Freq: Every day | SUBCUTANEOUS | Status: DC
  Administered 2022-03-07 – 2022-03-21 (×15): 10 [IU] via SUBCUTANEOUS
  Filled 2022-03-07 (×20): qty 0.1

## 2022-03-07 MED ORDER — METOPROLOL SUCCINATE ER 25 MG PO TB24
25.0000 mg | ORAL_TABLET | Freq: Every day | ORAL | Status: DC
  Administered 2022-03-08 – 2022-03-22 (×15): 25 mg via ORAL
  Filled 2022-03-07 (×15): qty 1

## 2022-03-07 MED ORDER — ENOXAPARIN SODIUM 40 MG/0.4ML IJ SOSY
40.0000 mg | PREFILLED_SYRINGE | INTRAMUSCULAR | Status: DC
  Administered 2022-03-08: 40 mg via SUBCUTANEOUS
  Filled 2022-03-07: qty 0.4

## 2022-03-07 NOTE — ED Triage Notes (Signed)
Pt with recent diagnosis of colitis-having five days of severe abdominal pain & n/v/d. Saw PCP today and was advised to come to the ED. Very little PO intake over the last several days.

## 2022-03-07 NOTE — ED Notes (Signed)
Pt is refusing vitals until seen

## 2022-03-07 NOTE — H&P (Cosign Needed)
NAME:  Meghan Alexander, MRN:  [id number], DOB:  Aug 31, 1955, LOS: 0 ADMISSION DATE:  03/07/2022, Primary: Meghan Dy, MD  CHIEF COMPLAINT:  abdominal pain, diarrhea  Medical Service: Internal Medicine Teaching Service         Attending Physician: Dr. Charise Killian, MD    First Contact: Dr. Dema Severin Pager: [phone]  Second Contact: Dr. 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 Patient was recently admitted 11/7-11/8 for infectious colitis due to enteropathogenic E. Coli and was discharged with five day course of ciprofloxacin. Since discharge, patient reports she completed her course of antibiotics although her symptoms have not changed. She continues to have abdominal pain, poor po intake, nausea, and diarrhea. Describes watery, yellow stools with little to no form a few times daily while taking Imodium. Also notes her abdomen began to swell roughly three days ago with worsening pain. She denies fevers, body aches, vomiting, chest pain, change in chronic dyspnea. Today she went to her PCP, who recommended she go to the ED to be evaluated more emergently.   PCP: Meghan Dy, MD  ED COURSE   Patient arrived this afternoon afebrile, tachycardic to 120's, normotensive, sating well on room air. Lab work revealed hyponatremia, acute kidney injury, hypoalbuminemia. IMTS subsequently called for admission for continued symptoms along with acute kidney injury.   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   HOME MEDICATIONS   Prior to Admission medications   Medication Sig Start Date End Date Taking? Authorizing Provider  Accu-Chek Softclix Lancets lancets Use to Alexander blood sugars up to 4 times daily as directed. 08/09/21   Allie Bossier, MD  Blood Glucose Monitoring Suppl (BLOOD GLUCOSE MONITOR SYSTEM) w/Device KIT Use to Alexander blood sugars up to 4 times daily. 08/09/21   Allie Bossier, MD  ciprofloxacin (CIPRO) 500 MG tablet Take 1 tablet (500 mg total) by mouth 2 (two) times daily. 03/02/22   Farrel Gordon, DO  diltiazem (CARDIZEM CD) 240 MG 24 hr capsule Take 1 capsule (240 mg total) by mouth daily. 10/11/21   Zola Button, MD  diphenhydramine-acetaminophen (TYLENOL PM) 25-500 MG TABS tablet Take 1 tablet by mouth at bedtime as needed (pain/sleep).    [provider]  ferrous sulfate 325 (65 FE) MG tablet Take 1 tablet (325 mg total) by mouth every other day. Patient taking differently: Take 325 mg by mouth daily. 09/08/21 03/01/22  Gerrit Heck, MD  fluticasone-salmeterol (ADVAIR HFA) 956-38 MCG/ACT inhaler Inhale 2 puffs into the lungs 2 (two) times daily. Patient taking differently: Inhale 2 puffs into the lungs daily. 02/14/22   Freddi Starr, MD  glucose blood (ACCU-CHEK GUIDE) Alexander strip Use to Alexander blood sugars up to 4 times daily as directed. 08/09/21   Allie Bossier, MD  HUMALOG KWIKPEN 100 UNIT/ML KwikPen Inject 5 Units into the skin with  breakfast, with lunch, and with evening meal. Patient taking differently: Inject 15 Units into the skin 2 (two) times daily. 10/11/21   Zola Button, MD  insulin detemir (LEVEMIR) 100 UNIT/ML FlexPen Inject 10 Units into the skin daily. 10/11/21   Zola Button, MD  Insulin Pen  Needle 32G X 4 MM MISC Use to inject insulin up to 4 times daily as directed. 08/09/21   Allie Bossier, MD  loperamide (IMODIUM) 2 MG capsule Take 1 capsule (2 mg total) by mouth as needed for diarrhea or loose stools. Not to exceed 8 capsules per day 03/02/22   Farrel Gordon, DO  losartan (COZAAR) 50 MG tablet Take 25 mg by mouth daily.    [provider]  Melatonin 10 MG CAPS Take 10 mg by mouth at bedtime.     [provider]  meloxicam (MOBIC) 15 MG tablet Take 15 mg by mouth daily. 02/16/22   [provider]  metFORMIN (GLUCOPHAGE) 500 MG tablet Take 500 mg by mouth daily with breakfast. 07/23/19   [provider]  metoprolol succinate (TOPROL-XL) 25 MG 24 hr tablet Take 1 tablet (25 mg total) by mouth daily. 10/12/21   Zola Button, MD  Multiple Vitamin (MULTIVITAMIN) tablet Take 1 tablet by mouth daily.    [provider]  omeprazole (PRILOSEC) 20 MG capsule Take 20 mg by mouth daily. 11/28/20   [provider]  rosuvastatin (CRESTOR) 10 MG tablet Take 10 mg by mouth daily.    [provider]  venlafaxine XR (EFFEXOR-XR) 37.5 MG 24 hr capsule Take 37.5 mg by mouth daily with breakfast.    [provider]    ALLERGIES   Allergies as of 03/07/2022 - Review Complete 03/07/2022  Allergen Reaction Noted   Latex Other (See Comments) 10/17/2014   Codeine Nausea And Vomiting and Other (See Comments) 08/16/2011   Atenolol Cough 07/08/2020   Other Other (See Comments) 12/14/2018   Hydrocodone Nausea And Vomiting 08/16/2011   Oxycodone Nausea And Vomiting 08/16/2011    SOCIAL HISTORY   Patient lives at home with her husband. Retired Pharmacist, hospital.  Patient can typically complete all ADL's and IADL's without assistance. No tobacco, alcohol, or illicit substance use.  FAMILY HISTORY   Her family history includes Breast cancer in her cousin, maternal aunt, maternal grandmother, and paternal grandmother; Hernia in her mother; Lung  cancer in her father. There is no history of Colon cancer, Colon polyps, Rectal cancer, or Stomach cancer.   REVIEW OF SYSTEMS   ROS per history of present illness.  PHYSICAL EXAMINATION   Blood pressure (!) 117/59, pulse (!) 121, temperature 99 F (37.2 C), temperature source Oral, resp. rate (!) 26, SpO2 91 %.    There were no vitals filed for this visit.  GENERAL: Resting comfortably in no acute distress. HENT: Normocephalic, atraumatic. Dry mucous membranes. No cervical lymphadenopathy. EYES: Vision grossly in tact. Conjunctival pallor appreciated, no scleral icterus.  CV: Tachycardic, regular rhythm. No murmurs appreciated. Distal pulses 2+ bilaterally. PULM: Normal work of breathing on room air. Clear to auscultation bilaterally. GI: Abdomen mildly distended, tympanic to percussion. Pain mostly in RUQ. No guarding. Normoactive bowel sounds. MSK: Normal bulk, tone. Non-pitting edema bilateral lower extremities between ankle and calf. SKIN: Warm, dry. No rashes or lesions appreciated. NEURO: Awake, alert, conversing appropriately. Grossly in tact. PSYCH: Normal mood, affect, speech.  SIGNIFICANT DIAGNOSTIC TESTS   ECG: Tachycardic, regular rhythm. Normal axis. Normal intervals. Q waves present in II, III, aVF similar to  previous. No ischemic ST changes appreciated.  I personally reviewed patient's ECG with my interpretation as above  LABS      Latest Ref Rng & Units 03/07/2022    2:54 PM 03/02/2022    4:21 AM 03/01/2022    3:42 PM  CBC  WBC 4.0 - 10.5 K/uL 11.7  9.8  10.6   Hemoglobin 12.0 - 15.0 g/dL 9.7  10.1  11.5   Hematocrit 36.0 - 46.0 % 29.1  29.7  34.2   Platelets 150 - 400 K/uL 421  281  326       Latest Ref Rng & Units 03/07/2022    2:54 PM 03/02/2022    4:21 AM 03/01/2022    3:42 PM  BMP  Glucose 70 - 99 mg/dL 217  96  144   BUN 8 - 23 mg/dL _0 Creatinine 0.44 - 1.00 mg/dL 1.58  1.06  1.24   Sodium 135 - 145 mmol/L 130  136  136   Potassium  3.5 - 5.1 mmol/L 4.3  4.0  3.9   Chloride 98 - 111 mmol/L 92  104  100   CO2 22 - 32 mmol/L _1 Calcium 8.9 - 10.3 mg/dL 8.2  8.0  8.7     CONSULTS   N/a  ASSESSMENT   Fatoumata Albaugh is 66yo person with paroxysmal atrial fibrillation not on anticoagulation, insulin-dependent type II diabetes mellitus, esophageal achalasia, 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 coli admitted 11/13 with worsening abdominal pain and swelling concerning for complication of ischemic colitis along with acute kidney injury.  PLAN   Principal Problem:   Infectious colitis  #Infectious colitis 2/2 enteropathogenic E. Coli vs secondary infection Patient is presenting to [facility] after recent admission where she was found to have colitis due to EPEC E. Coli. She reports her symptoms never changed until a few days ago when her abdomen became more swollen, although her pain has been similar. She also continues to have nausea, diarrhea, and poor po intake. She is tachycardic but otherwise hemodynamically stable with normal lactate. Most likely this is continued symptoms from her infection, although cannot rule out a secondary infection or possible complication, including toxic megacolon vs perforation (although I would expect her pain to be worse). We will obtain imaging this evening to further characterize. Could consider C. Diff, but given her symptoms never changed I think this would be less likely. She also had negative result for this one week ago. Hepatobiliary etiologies seem less likely as well given normal LFT's and Tbili. Previously had work-up for celiac's and pancreatic insufficiency both of which where negative. Inflammatory markers during previous admission were elevated, although this is non-specific. I do not see previous fecal lactoferrin, we will obtain this today to assess for possible IBD. Given she  completed a course of antibiotics without any changes in symptoms, I will hold off antibiotics until we having more information with imaging. Patient has also been taking Imodium at home, although unclear exactly how much. This could be contributing to her abdominal distention. If work-up unrevealing and symptoms persist, would consider GI consultation for any further recommendations.  - CT abdomen/pelvis with contrast - Follow-up GI panel, fecal lactoferrin - IVF overnight - Consider GI consultation if symptoms persist   #Acute kidney injury #Anion gap metabolic acidosis sCr 7.68 w/ GFR 35 on arrival today, last sCr 1.06 on  day of discharge 11/8. On exam, patient appears hypovolemic. Given poor po intake and continued diarrhea, AKI most likely pre-renal. We will give fluids and monitor renal function. I do not see any previous history of heart failure, last TTE w/ normal EF earlier this year.   CLINICAL UPDATE: AM labs revealed worsening renal function with acidosis and elevated anion gap. Suspect this is still pre-renal due to continued on-going losses, I will re-check another lactic this morning and initiate maintenance fluids. Will also check renal ultrasound. Electrolytes are within normal limits, would keep close eye on K and Mg over next few days.  - Maintenance LR 125cc/hr x12hr, will give fourth bolus as well - Follow-up renal ultrasound - Repeat lactic acid  - Repeat BMP at noon today - Avoid nephrotoxins  #Type II diabetes mellitus A1c during last admission 7.2%. Given poor po intake we will be cautious giving insulin.  - Hold home metformin with AKI - Semglee 10 units QHS - SSI  #Paroxysmal atrial fibrillation not on anticoagulation #Hypertension Currently normotensive, she is not septic. We will hold home losartan due to AKI and continue home cardizem and metoprolol.  - Hold home losartan - Continue home diltiazem and metoprolol  #Normocytic anemia Hgb on arrival 9.7 from  10.1 at last admission. She denies melena or blood in stool. Most likely anemia 2/2 acute illness. She does have elevated RDW with low-normocytic anemia, could have underlying iron deficiency as well. I do see where iron supplementation was prescribed to her in the past. Will hold off checking iron studies, as would not give iron at this time due to her active infection. Will have her follow-up with her primary care physician for this after acute illness. - Daily CBC - Iron studies as outpatient after illness  #Hypovolemic hyponatremia Na 130 on initial labs most likely secondary to dehydration from GI illness. We will plant o give fluids and re-check labs in the morning.   #Thrombocytosis PLT 421, likely reactive due to infection. Will continue daily CBC.  #Hypoalbuminemia Prior to acute illness, albumin normal. Will need to encourage PO intake, could consider protein shake supplements to help.   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 Respiratory status stable, sating well on room air. Exam also re-assuring. We will continue to hold steroids for now.  Ruthe Mannan twice daily  [facility] PRACTICE   DIET: CM IVF: LR DVT PPX: lovenox BOWEL: na CODE: FULL FAM COM: Discussed with patient and her husband at bedside on admission  DISPO: Admit patient to Observation with expected length of stay less than 2 midnights.  Sanjuan Dame, MD Internal Medicine Resident PGY-3 Pager [phone] 03/07/2022 10:27 PM

## 2022-03-07 NOTE — ED Provider Triage Note (Signed)
Emergency Medicine Provider Triage Evaluation Note  Meghan Alexander , a 66 y.o. female  was evaluated in triage.  Pt complains of abdominal pain and weakness.  Pt states she is in renal failure.  Pt saw Dr. Mancel Bale today and was told to come here for admission.  Pt reports she was diagnosed with   Review of Systems  Positive: Weakness and diarrhea  Negative: fever  Physical Exam  BP 121/67 (BP Location: Right Arm)   Pulse (!) 126   Temp 99.4 F (37.4 C)   Resp 18   SpO2 98%  Gen:   Awake, tachycardic, pale Resp:  Normal effort  MSK:   Moves extremities without difficulty  Other:    Medical Decision Making  Medically screening exam initiated at 2:27 PM.  Appropriate orders placed.  Meghan Alexander was informed that the remainder of the evaluation will be completed by another provider, this initial triage assessment does not replace that evaluation, and the importance of remaining in the ED until their evaluation is complete.     Fransico Meadow, [place] 03/07/22 1429

## 2022-03-07 NOTE — ED Provider Notes (Signed)
[facility] Provider Note   CSN: [id number] Arrival date & time: 03/07/22  1237     History  Chief Complaint  Patient presents with   Diarrhea    [facility] is a 66 y.o. female.  Patient with history of diabetes presents to the emergency department today for evaluation of of dehydration and ongoing diarrhea.  Patient was admitted 11/6-11/8 for diarrhea and AKI.  She had CT imaging at that time which was suggestive of colitis and stool studies which were positive for enteropathogenic E. coli.  Patient was treated with ciprofloxacin which she completed yesterday.  She states that her symptoms have been persistent despite treatment.  She has had multiple episodes of watery stool with bright and dark red blood.  She reports decreased urination and oral intake.  She has had about 32 ounces of propel per day.  She has generalized abdominal pain.  States that she saw her primary care doctor today for follow-up and was told to come back to the emergency department.  She denies preceding travel, suspicious food or water exposures.  She has not noted a foul odor to her stool.       Home Medications Prior to Admission medications   Medication Sig Start Date End Date Taking? Authorizing Provider  Accu-Chek Softclix Lancets lancets Use to test blood sugars up to 4 times daily as directed. 08/09/21   Allie Bossier, MD  Blood Glucose Monitoring Suppl (BLOOD GLUCOSE MONITOR SYSTEM) w/Device KIT Use to test blood sugars up to 4 times daily. 08/09/21   Allie Bossier, MD  ciprofloxacin (CIPRO) 500 MG tablet Take 1 tablet (500 mg total) by mouth 2 (two) times daily. 03/02/22   Farrel Gordon, DO  diltiazem (CARDIZEM CD) 240 MG 24 hr capsule Take 1 capsule (240 mg total) by mouth daily. 10/11/21   Zola Button, MD  diphenhydramine-acetaminophen (TYLENOL PM) 25-500 MG TABS tablet Take 1 tablet by mouth at bedtime as needed (pain/sleep).    [provider]   ferrous sulfate 325 (65 FE) MG tablet Take 1 tablet (325 mg total) by mouth every other day. Patient taking differently: Take 325 mg by mouth daily. 09/08/21 03/01/22  Gerrit Heck, MD  fluticasone-salmeterol (ADVAIR HFA) 233-00 MCG/ACT inhaler Inhale 2 puffs into the lungs 2 (two) times daily. Patient taking differently: Inhale 2 puffs into the lungs daily. 02/14/22   Freddi Starr, MD  glucose blood (ACCU-CHEK GUIDE) test strip Use to test blood sugars up to 4 times daily as directed. 08/09/21   Allie Bossier, MD  HUMALOG KWIKPEN 100 UNIT/ML KwikPen Inject 5 Units into the skin with breakfast, with lunch, and with evening meal. Patient taking differently: Inject 15 Units into the skin 2 (two) times daily. 10/11/21   Zola Button, MD  insulin detemir (LEVEMIR) 100 UNIT/ML FlexPen Inject 10 Units into the skin daily. 10/11/21   Zola Button, MD  Insulin Pen Needle 32G X 4 MM MISC Use to inject insulin up to 4 times daily as directed. 08/09/21   Allie Bossier, MD  loperamide (IMODIUM) 2 MG capsule Take 1 capsule (2 mg total) by mouth as needed for diarrhea or loose stools. Not to exceed 8 capsules per day 03/02/22   Farrel Gordon, DO  losartan (COZAAR) 50 MG tablet Take 25 mg by mouth daily.    [provider]  Melatonin 10 MG CAPS Take 10 mg by mouth at bedtime.     [provider]  meloxicam (MOBIC) 15 MG tablet Take 15 mg by mouth daily. 02/16/22   [provider]  metFORMIN (GLUCOPHAGE) 500 MG tablet Take 500 mg by mouth daily with breakfast. 07/23/19   [provider]  metoprolol succinate (TOPROL-XL) 25 MG 24 hr tablet Take 1 tablet (25 mg total) by mouth daily. 10/12/21   Zola Button, MD  Multiple Vitamin (MULTIVITAMIN) tablet Take 1 tablet by mouth daily.    [provider]  omeprazole (PRILOSEC) 20 MG capsule Take 20 mg by mouth daily. 11/28/20   [provider]  rosuvastatin (CRESTOR) 10 MG tablet Take 10 mg by mouth daily.     [provider]  venlafaxine XR (EFFEXOR-XR) 37.5 MG 24 hr capsule Take 37.5 mg by mouth daily with breakfast.    [provider]      Allergies    Latex, Codeine, Atenolol, Other, Hydrocodone, and Oxycodone    Review of Systems   Review of Systems  Physical Exam Updated Vital Signs BP 121/67 (BP Location: Right Arm)   Pulse (!) 126   Temp 99 F (37.2 C) (Oral)   Resp 18   SpO2 98%   Physical Exam Vitals and nursing note reviewed.  Constitutional:      General: She is not in acute distress.    Appearance: She is well-developed.  HENT:     Head: Normocephalic and atraumatic.     Right Ear: External ear normal.     Left Ear: External ear normal.     Nose: Nose normal.     Mouth/Throat:     Mouth: Mucous membranes are dry.     Comments: Dry mucous membranes Eyes:     Conjunctiva/sclera: Conjunctivae normal.  Cardiovascular:     Rate and Rhythm: Regular rhythm. Tachycardia present.     Heart sounds: No murmur heard.    Comments: Tachycardic to approximately 125 Pulmonary:     Effort: No respiratory distress.     Breath sounds: No wheezing, rhonchi or rales.  Abdominal:     Palpations: Abdomen is soft.     Tenderness: There is abdominal tenderness. There is no guarding or rebound.     Comments: Mild generalized tenderness palpation without rebound or guarding  Musculoskeletal:     Cervical back: Normal range of motion and neck supple.     Right lower leg: No edema.     Left lower leg: No edema.  Skin:    General: Skin is warm and dry.     Findings: No rash.  Neurological:     General: No focal deficit present.     Mental Status: She is alert. Mental status is at baseline.     Motor: No weakness.  Psychiatric:        Mood and Affect: Mood normal.     ED Results / Procedures / Treatments   Labs (all labs ordered are listed, but only abnormal results are displayed) Labs Reviewed  CBC WITH DIFFERENTIAL/PLATELET - Abnormal; Notable for the  following components:      Result Value   WBC 11.7 (*)    RBC 3.44 (*)    Hemoglobin 9.7 (*)    HCT 29.1 (*)    RDW 16.4 (*)    Platelets 421 (*)    Neutro Abs 9.3 (*)    Lymphs Abs 0.4 (*)    Monocytes Absolute 1.5 (*)    Abs Immature Granulocytes 0.45 (*)    All other components within normal limits  COMPREHENSIVE METABOLIC PANEL -  Abnormal; Notable for the following components:   Sodium 130 (*)    Chloride 92 (*)    Glucose, Bld 217 (*)    Creatinine, Ser 1.58 (*)    Calcium 8.2 (*)    Total Protein 5.3 (*)    Albumin 1.8 (*)    GFR, Estimated 35 (*)    All other components within normal limits  LACTIC ACID, PLASMA  LACTIC ACID, PLASMA  MAGNESIUM    ED ECG REPORT   Date: 03/07/2022  Rate: 127  Rhythm: sinus tachycardia  QRS Axis: normal  Intervals: normal  ST/T Wave abnormalities: normal  Conduction Disutrbances:none  Narrative Interpretation: GQB 169  Old EKG Reviewed: unchanged  I have personally reviewed the EKG tracing and agree with the computerized printout as noted.   Radiology No results found.  Procedures Procedures    Medications Ordered in ED Medications  sodium chloride 0.9 % bolus 1,000 mL (1,000 mLs Intravenous New Bag/Given 03/07/22 1947)    ED Course/ Medical Decision Making/ A&P    Patient seen and examined. History obtained directly from patient. Work-up including labs, imaging, EKG ordered in triage, if performed, were reviewed.    Labs/EKG: Independently reviewed and interpreted.  This included: CBC with mildly elevated white blood cell count at 11.7, hemoglobin slightly downtrending to 9.7 today compared to recent hospitalization; CMP showing AKI creatinine 1.58, sodium 130, glucose 217 with normal anion gap.   Added magnesium, lactate given tachycardia, EKG.  Imaging: None ordered.  Considered repeat CT imaging, however patient without fever or peritoneal signs on exam.  Medications/Fluids: Ordered: IV fluid bolus  Most  recent vital signs reviewed and are as follows: BP 121/67 (BP Location: Right Arm)   Pulse (!) 126   Temp 99 F (37.2 C) (Oral)   Resp 18   SpO2 98%   Initial impression: Diarrhea, dehydration, AKI  9:23 PM Reassessment performed. Patient appears stable.  Labs personally reviewed and interpreted including: Lactate normal at 1.6, magnesium 2.1.  Reviewed pertinent lab work and imaging with patient at bedside. Questions answered.   Most current vital signs reviewed and are as follows: BP 122/65   Pulse (!) 123   Temp 99 F (37.2 C) (Oral)   Resp (!) 25   SpO2 93%   Plan: Admit to hospital.   Patient discussed with Dr. Doren Custard.   Consulted with Dr. Abagail Kitchens IMTS who will see patient.                          Medical Decision Making Amount and/or Complexity of Data Reviewed Labs: ordered.  Risk Decision regarding hospitalization.   For this patient's complaint of abdominal pain/diarrhea, the following conditions were considered on the differential diagnosis: gastritis/PUD, enteritis/duodenitis, appendicitis, cholelithiasis/cholecystitis, cholangitis, pancreatitis, ruptured viscus, colitis, diverticulitis, small/large bowel obstruction, proctitis, cystitis, pyelonephritis, ureteral colic, aortic dissection, aortic aneurysm. In women, ectopic pregnancy, pelvic inflammatory disease, ovarian cysts, and tubo-ovarian abscess were also considered. Atypical chest etiologies were also considered including ACS, PE, and pneumonia.   Patient will be admitted for dehydration with acute kidney injury.  Low concern for severe sepsis at this time, lactate is normal.  Magnesium is normal.  Slightly lower hemoglobin today, but no suspicion for large-volume GI bleeding at this time.  Blood reported likely related to colitis.  The patient's vital signs, pertinent lab work and imaging were reviewed and interpreted as discussed in the ED course. Hospitalization was considered for further testing,  treatments, or serial  exams/observation. However as patient is well-appearing, has a stable exam, and reassuring studies today, I do not feel that they warrant admission at this time. This plan was discussed with the patient who verbalizes agreement and comfort with this plan and seems reliable and able to return to the Emergency Department with worsening or changing symptoms.          Final Clinical Impression(s) / ED Diagnoses Final diagnoses:  Diarrhea, unspecified type  Acute kidney injury ([place])  Sinus tachycardia    Rx / DC Orders ED Discharge Orders     None         Carlisle Cater, Hershal Coria 03/07/22 2124    Godfrey Pick, MD 03/08/22 1454

## 2022-03-08 ENCOUNTER — Encounter (HOSPITAL_COMMUNITY): Payer: Self-pay | Admitting: Internal Medicine

## 2022-03-08 ENCOUNTER — Observation Stay (HOSPITAL_COMMUNITY): Payer: Medicare Other

## 2022-03-08 ENCOUNTER — Inpatient Hospital Stay (HOSPITAL_COMMUNITY): Payer: Medicare Other

## 2022-03-08 DIAGNOSIS — B9620 Unspecified Escherichia coli [E. coli] as the cause of diseases classified elsewhere: Secondary | ICD-10-CM | POA: Diagnosis not present

## 2022-03-08 DIAGNOSIS — D62 Acute posthemorrhagic anemia: Secondary | ICD-10-CM | POA: Diagnosis not present

## 2022-03-08 DIAGNOSIS — K644 Residual hemorrhoidal skin tags: Secondary | ICD-10-CM | POA: Diagnosis present

## 2022-03-08 DIAGNOSIS — E872 Acidosis, unspecified: Secondary | ICD-10-CM | POA: Diagnosis present

## 2022-03-08 DIAGNOSIS — D6489 Other specified anemias: Secondary | ICD-10-CM | POA: Diagnosis present

## 2022-03-08 DIAGNOSIS — Z8572 Personal history of non-Hodgkin lymphomas: Secondary | ICD-10-CM | POA: Diagnosis not present

## 2022-03-08 DIAGNOSIS — E1165 Type 2 diabetes mellitus with hyperglycemia: Secondary | ICD-10-CM | POA: Diagnosis present

## 2022-03-08 DIAGNOSIS — Z803 Family history of malignant neoplasm of breast: Secondary | ICD-10-CM | POA: Diagnosis not present

## 2022-03-08 DIAGNOSIS — E860 Dehydration: Secondary | ICD-10-CM | POA: Diagnosis present

## 2022-03-08 DIAGNOSIS — Z801 Family history of malignant neoplasm of trachea, bronchus and lung: Secondary | ICD-10-CM | POA: Diagnosis not present

## 2022-03-08 DIAGNOSIS — I10 Essential (primary) hypertension: Secondary | ICD-10-CM | POA: Diagnosis present

## 2022-03-08 DIAGNOSIS — Z794 Long term (current) use of insulin: Secondary | ICD-10-CM | POA: Diagnosis not present

## 2022-03-08 DIAGNOSIS — E861 Hypovolemia: Secondary | ICD-10-CM | POA: Diagnosis present

## 2022-03-08 DIAGNOSIS — A09 Infectious gastroenteritis and colitis, unspecified: Secondary | ICD-10-CM | POA: Diagnosis present

## 2022-03-08 DIAGNOSIS — K6289 Other specified diseases of anus and rectum: Secondary | ICD-10-CM | POA: Diagnosis not present

## 2022-03-08 DIAGNOSIS — A040 Enteropathogenic Escherichia coli infection: Secondary | ICD-10-CM | POA: Diagnosis present

## 2022-03-08 DIAGNOSIS — Z79899 Other long term (current) drug therapy: Secondary | ICD-10-CM | POA: Diagnosis not present

## 2022-03-08 DIAGNOSIS — J704 Drug-induced interstitial lung disorders, unspecified: Secondary | ICD-10-CM | POA: Diagnosis present

## 2022-03-08 DIAGNOSIS — F32A Depression, unspecified: Secondary | ICD-10-CM | POA: Diagnosis present

## 2022-03-08 DIAGNOSIS — K219 Gastro-esophageal reflux disease without esophagitis: Secondary | ICD-10-CM | POA: Diagnosis present

## 2022-03-08 DIAGNOSIS — E8809 Other disorders of plasma-protein metabolism, not elsewhere classified: Secondary | ICD-10-CM | POA: Diagnosis present

## 2022-03-08 DIAGNOSIS — N179 Acute kidney failure, unspecified: Secondary | ICD-10-CM | POA: Diagnosis present

## 2022-03-08 DIAGNOSIS — D75838 Other thrombocytosis: Secondary | ICD-10-CM | POA: Diagnosis present

## 2022-03-08 DIAGNOSIS — E876 Hypokalemia: Secondary | ICD-10-CM | POA: Diagnosis present

## 2022-03-08 DIAGNOSIS — K529 Noninfective gastroenteritis and colitis, unspecified: Secondary | ICD-10-CM | POA: Diagnosis not present

## 2022-03-08 DIAGNOSIS — K6389 Other specified diseases of intestine: Secondary | ICD-10-CM | POA: Diagnosis not present

## 2022-03-08 DIAGNOSIS — I480 Paroxysmal atrial fibrillation: Secondary | ICD-10-CM | POA: Diagnosis present

## 2022-03-08 DIAGNOSIS — K648 Other hemorrhoids: Secondary | ICD-10-CM | POA: Diagnosis present

## 2022-03-08 DIAGNOSIS — K633 Ulcer of intestine: Secondary | ICD-10-CM | POA: Diagnosis present

## 2022-03-08 DIAGNOSIS — D500 Iron deficiency anemia secondary to blood loss (chronic): Secondary | ICD-10-CM | POA: Diagnosis not present

## 2022-03-08 LAB — BASIC METABOLIC PANEL
Anion gap: 16 — ABNORMAL HIGH (ref 5–15)
Anion gap: 10 (ref 5–15)
BUN: 28 mg/dL — ABNORMAL HIGH (ref 8–23)
BUN: 22 mg/dL (ref 8–23)
CO2: 20 mmol/L — ABNORMAL LOW (ref 22–32)
CO2: 23 mmol/L (ref 22–32)
Calcium: 8.2 mg/dL — ABNORMAL LOW (ref 8.9–10.3)
Calcium: 8.2 mg/dL — ABNORMAL LOW (ref 8.9–10.3)
Chloride: 95 mmol/L — ABNORMAL LOW (ref 98–111)
Chloride: 102 mmol/L (ref 98–111)
Creatinine, Ser: 1.9 mg/dL — ABNORMAL HIGH (ref 0.44–1.00)
Creatinine, Ser: 1.35 mg/dL — ABNORMAL HIGH (ref 0.44–1.00)
GFR, Estimated: 28 mL/min — ABNORMAL LOW (ref >60)
GFR, Estimated: 43 mL/min — ABNORMAL LOW (ref >60)
Glucose, Bld: 181 mg/dL — ABNORMAL HIGH (ref 70–99)
Glucose, Bld: 114 mg/dL — ABNORMAL HIGH (ref 70–99)
Potassium: 4 mmol/L (ref 3.5–5.1)
Potassium: 3.4 mmol/L — ABNORMAL LOW (ref 3.5–5.1)
Sodium: 131 mmol/L — ABNORMAL LOW (ref 135–145)
Sodium: 135 mmol/L (ref 135–145)

## 2022-03-08 LAB — CBC
HCT: 26.2 % — ABNORMAL LOW (ref 36.0–46.0)
Hemoglobin: 8.8 g/dL — ABNORMAL LOW (ref 12.0–15.0)
MCH: 28.3 pg (ref 26.0–34.0)
MCHC: 33.6 g/dL (ref 30.0–36.0)
MCV: 84.2 fL (ref 80.0–100.0)
Platelets: 444 10*3/uL — ABNORMAL HIGH (ref 150–400)
RBC: 3.11 MIL/uL — ABNORMAL LOW (ref 3.87–5.11)
RDW: 16.6 % — ABNORMAL HIGH (ref 11.5–15.5)
WBC: 10.9 10*3/uL — ABNORMAL HIGH (ref 4.0–10.5)
nRBC: 0 % (ref 0.0–0.2)

## 2022-03-08 LAB — GLUCOSE, CAPILLARY
Glucose-Capillary: 179 mg/dL — ABNORMAL HIGH (ref 70–99)
Glucose-Capillary: 118 mg/dL — ABNORMAL HIGH (ref 70–99)

## 2022-03-08 LAB — C DIFFICILE QUICK SCREEN W PCR REFLEX
C Diff antigen: NEGATIVE
C Diff interpretation: NOT DETECTED
C Diff toxin: NEGATIVE

## 2022-03-08 LAB — CBG MONITORING, ED
Glucose-Capillary: 134 mg/dL — ABNORMAL HIGH (ref 70–99)
Glucose-Capillary: 99 mg/dL (ref 70–99)

## 2022-03-08 LAB — LACTOFERRIN, FECAL, QUALITATIVE: Lactoferrin, Fecal, Qual: POSITIVE — AB

## 2022-03-08 MED ORDER — LACTATED RINGERS IV BOLUS
1000.0000 mL | Freq: Once | INTRAVENOUS | Status: AC
  Administered 2022-03-08: 1000 mL via INTRAVENOUS

## 2022-03-08 MED ORDER — MOMETASONE FURO-FORMOTEROL FUM 200-5 MCG/ACT IN AERO
2.0000 | INHALATION_SPRAY | Freq: Two times a day (BID) | RESPIRATORY_TRACT | Status: DC
  Filled 2022-03-08: qty 8.8

## 2022-03-08 MED ORDER — LACTATED RINGERS IV SOLN: INTRAVENOUS | Status: AC

## 2022-03-08 MED ORDER — IOHEXOL 350 MG/ML SOLN
75.0000 mL | Freq: Once | INTRAVENOUS | Status: AC | PRN
  Administered 2022-03-08: 75 mL via INTRAVENOUS

## 2022-03-08 NOTE — ED Notes (Signed)
Attempted to bring pt to CT x3. Unable to bring pt to CT d/t pt having constant diarrhea. Pt cleaned. Full linen change. Call bell in reach. Pt resting in bed.

## 2022-03-08 NOTE — Progress Notes (Addendum)
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  Overnight Events: NAEO  Patient states stools are all bloody, not sure how long that has been going on. It's burgundy-colored. Says diarrhea never got better at home after discharge. Had diarrhea constantly last night. Abdominal swelling started a couple weeks ago, has gotten worse over last several days. Abdominal pain has been worsening each day. Denies n/v. Diarrhea was yellow/golden in color at home. Was taking imodium 20 tablets a day, every 2 hours but this didn't help. Was taking imodium before coming in last time, about 2 tablets a day.   Objective:  Vital signs in last 24 hours: Vitals:   03/08/22 1045 03/08/22 1143 03/08/22 1145 03/08/22 1200  BP: 123/66  128/63 128/66  Pulse: (!) 111  (!) 109 (!) 104  Resp: '17  20 18  '$ Temp:  98.1 F (36.7 C)    TempSrc:  Oral    SpO2: 96%  96% 95%   Supplemental O2: Room Air SpO2: 95 %   Physical Exam:  Constitutional: well-appearing female sitting in hospital bed, in no acute distress HENT: normocephalic atraumatic, mucous membranes moist Eyes: conjunctiva non-erythematous Neck: supple Pulmonary/Chest: normal work of breathing on room air Abdominal: soft, distended, tympanic, right sided tenderness. No rebound tenderness or guarding. MSK: normal bulk and tone Neurological: alert & oriented x 3 Skin: warm and dry Psych: Normal mood and affect  There were no vitals filed for this visit.  No intake or output data in the 24 hours ending 03/08/22 1319 Net IO Since Admission: No IO data has been entered for this period [03/08/22 1319]  Pertinent  Labs:    Latest Ref Rng & Units 03/08/2022    1:50 AM 03/07/2022    2:54 PM 03/02/2022    4:21 AM  CBC  WBC 4.0 - 10.5 K/uL 10.9  11.7  9.8   Hemoglobin 12.0 - 15.0 g/dL 8.8  9.7  10.1   Hematocrit 36.0 - 46.0 % 26.2  29.1  29.7   Platelets 150 - 400 K/uL 444  421  281        Latest Ref Rng & Units 03/08/2022    1:50 AM 03/07/2022    2:54 PM 03/02/2022    4:21 AM  CMP  Glucose 70 - 99 mg/dL 181  217  96   BUN 8 - 23 mg/dL '28  21  16   '$ Creatinine 0.44 - 1.00 mg/dL 1.90  1.58  1.06   Sodium 135 - 145 mmol/L 131  130  136   Potassium 3.5 - 5.1 mmol/L 4.0  4.3  4.0   Chloride 98 - 111 mmol/L 95  92  104   CO2 22 - 32 mmol/L '20  23  24   '$ Calcium 8.9 - 10.3 mg/dL 8.2  8.2  8.0   Total Protein 6.5 - 8.1 g/dL  5.3  4.7   Total Bilirubin 0.3 - 1.2 mg/dL  0.7  0.8   Alkaline Phos 38 - 126 U/L  75  58   AST 15 - 41 U/L  20  11   ALT 0 - 44 U/L  21  15     Imaging: DG Abd 1 View  Result Date: 03/08/2022 CLINICAL DATA:  66 year old female with history of diarrhea. EXAM: ABDOMEN - 1 VIEW COMPARISON:  Chest x-ray 02/28/2022. FINDINGS: There is relative paucity of bowel gas throughout the abdomen, with the exception of a prominent loop of gas-filled bowel in the upper abdomen, which appears to represent the transverse colon, measuring up to 7.2 cm in diameter. No distal rectal gas is noted. No definite pneumoperitoneum. IMPRESSION: 1. Unusual but nonobstructive bowel gas pattern, as above. 2. No pneumoperitoneum. Electronically Signed   By: Vinnie Langton M.D.   On: 03/08/2022 06:29   US RENAL  Result Date: 03/08/2022 CLINICAL DATA:  66 year old female with history of acute kidney injury. EXAM: RENAL / URINARY TRACT ULTRASOUND COMPLETE COMPARISON:  No prior ultrasound. CT of the abdomen and pelvis 03/01/2022. FINDINGS: Right Kidney: Renal measurements: 11.1 x 5.0 x 5.4 = volume: 156.3 mL. Echogenicity within normal limits. No mass or hydronephrosis visualized. Left Kidney: Renal  measurements: 11.8 x 6.1 x 5.5 = volume: 205.1 mL. Echogenicity within normal limits. No mass or hydronephrosis visualized. Bladder: Appears normal for degree of bladder distention. Other: None. IMPRESSION: 1. No acute findings. Specifically, no hydroureteronephrosis. Sonographic appearance of the kidneys is within normal limits. Electronically Signed   By: Vinnie Langton M.D.   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 coli admitted 11/13 with worsening abdominal pain and swelling concerning for complication of ischemic colitis along with acute kidney injury.   #Infectious colitis 2/2 enteropathogenic E. Coli vs secondary infection Stool positive for EPEC last admission and discharged with ciprofloxacin 5 day course which she completed. Now reporting blood in stool which is not typical for EPEC. Currently afebrile, tachycardic with mild, improving leukocytosis. KUB with dilated bowel but nonobstructive gas pattern at transverse colon. Diarrhea refractory to as many as 20 tablets loperamide daily, overuse raising concern for megacolon. CT abdomen/pelvis with contrast pending resolution of AKI. GI consulted, awaiting CT and GI panel results. May need endoscopy. - GI consulted, appreciated recommendations - Follow up CTAP - Follow-up GI panel, fecal lactoferrin - Follow-up c diff panel - Consider GI consultation if symptoms persist    #Acute kidney injury, improving #Anion gap metabolic acidosis, resolved sCr 1.58 w/ GFR 35 on arrival in the setting of poor po intake and diarrhea, suspicious for pre-renal etiology. Has received 5L IVF. Cr improved to  1.35. Renal ultrasound normal. Electrolytes wnl.  Anion gap closed. Repeat lactate pending. - IVF prn - f/u repeat lactic acid  - Avoid nephrotoxins - daily BMP   #Type II diabetes mellitus A1c during last admission 7.2%. Fasting glucose at goal today. - Hold home metformin with AKI - Semglee 10 units QHS - SSI   #Paroxysmal atrial fibrillation not on anticoagulation #Hypertension Normotensive. Holding home losartan given AKI. - Hold home losartan - Continue home diltiazem and metoprolol   #Normocytic anemia Hgb down to 8.8 from 9.7. Endorsing bloody stools going on for at least a few days now, though this was not shared with admitting team. GI consulted. Possible that this is GI blood loss mixed with anemia 2/2 acute illness. Elevated RDW with low-normocytic anemia, could have underlying iron deficiency as well. Has received iron supplementation in the past. Will hold off on iron panel given active infection  but have her follow-up with PCP after acute illness. - Daily CBC - Iron studies as outpatient after illness   #Hypovolemic hyponatremia Na 131. Most likely secondary to dehydration from GI illness.  -IVF -daily BMP   #Thrombocytosis PLT 444, likely reactive due to infection.  -trend daily CBC.   #Hypoalbuminemia Prior to acute illness, albumin normal. Will need to encourage PO intake, could consider protein shake supplements to help.    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 Respiratory status stable, sating well on room air. Exam also re-assuring. We will continue to hold steroids for now.  Ruthe Mannan twice daily   [facility]    DIET: CM IVF: LR DVT PPX: lovenox BOWEL: na CODE: FULL FAM COM: Discussed with patient and her husband at bedside on admission   DISPO: Admit patient to Observation with expected length of stay  less than 2 midnights.  Linward Natal MD Internal Medicine Resident PGY-1 Please contact the on call pager after 5 pm and on weekends at [phone].

## 2022-03-08 NOTE — ED Notes (Signed)
Pt continues to have constant diarrhea. Pt's stool redish in color. MD notified.

## 2022-03-08 NOTE — ED Notes (Signed)
Unable to draw labs, attempted twice. Will notify phlebotomist to try.

## 2022-03-08 NOTE — Consult Note (Signed)
Reason for Consult: Bloody diarrhea Referring Physician: Hospital team  Toyia Jelinek is an 66 y.o. female.  HPI: Patient seen and examined and her hospital computer chart and our office computer chart was reviewed and her case discussed with the hospital team and she has never had a colonoscopy but has done the Cologuard stool test in the past which has been negative and her previous PET scan for lymphoma was reviewed and she is only occasionally had some diarrhea issues and she has not had any sick contacts and was diagnosed recently with E. coli and treated with 5 days of Cipro but a few days ago her stools turned bloody which they were not originally and with increasing abdominal pain and the diarrhea seemingly not going away to return to the ER was admitted for further work-up and plans she has been using a lot of Lomotil but no vomiting no other complaints we discussed her chronic achalasia as well and her lungs have seemingly been better since the winter  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   GERD (gastroesophageal reflux disease)    hx of reflux-went away with elevating HOB    Hypertension    Pneumonia    hx of    PONV (postoperative nausea and vomiting)    Type 2 diabetes mellitus ([place]) 08/02/2021    Past Surgical History:  Procedure Laterality Date   ABDOMINAL HYSTERECTOMY     ADENOIDECTOMY     BRONCHIAL BRUSHINGS  08/03/2021   Procedure: BRONCHIAL BRUSHINGS;  Surgeon: Collene Gobble, MD;  Location: [facility];  Service: Cardiopulmonary;;   BRONCHIAL WASHINGS  08/03/2021   Procedure: BRONCHIAL WASHINGS;  Surgeon: Collene Gobble, MD;  Location: [facility];  Service: Cardiopulmonary;;   cosmetic surgeries     ESOPHAGEAL MANOMETRY N/A 12/02/2015    Procedure: ESOPHAGEAL MANOMETRY (EM);  Surgeon: [name], MD;  Location: [facility];  Service: Endoscopy;  Laterality: N/A;   ESOPHAGOGASTRODUODENOSCOPY N/A 12/02/2015   Procedure: ESOPHAGOGASTRODUODENOSCOPY (EGD);  Surgeon: [name], MD;  Location: [facility];  Service: Endoscopy;  Laterality: N/A;   lymph node removal left leg     cat scratch surgery    ORIF ANKLE FRACTURE Right 09/03/2021   Procedure: OPEN REDUCTION INTERNAL FIXATION (ORIF) ANKLE FRACTURE;  Surgeon: Willaim Sheng, MD;  Location: [facility];  Service: Orthopedics;  Laterality: Right;   THROAT SURGERY     sleep apnea surgery    TONSILLECTOMY     VIDEO BRONCHOSCOPY  08/03/2021   Procedure: VIDEO BRONCHOSCOPY WITH FLUORO;  Surgeon: Collene Gobble, MD;  Location[facility];  Service: Cardiopulmonary;;    Family History  Problem Relation Age of Onset   Lung cancer Father        1980-08-03 died from it   Hernia Mother    Breast cancer Maternal Aunt        multiple    Breast cancer Maternal Grandmother    Breast cancer Paternal Grandmother    Breast cancer Cousin        cousins multiple    Colon cancer Neg Hx    Colon polyps Neg Hx    Rectal cancer Neg Hx    Stomach cancer Neg Hx     Social  History:  reports that she has never smoked. She has never used smokeless tobacco. She reports that she does not drink alcohol and does not use drugs.  Allergies:  Allergies  Allergen Reactions   Latex Other (See Comments)    Blisters / skin peeling   Codeine Nausea And Vomiting and Other (See Comments)    hallucinations   Atenolol Cough   Other Other (See Comments)    DermaPlast; Used after surgery - caused swelling, itching, and wound broke open   Hydrocodone Nausea And Vomiting   Oxycodone Nausea And Vomiting    Medications: I have reviewed the patient's current medications.  Results for orders placed or performed during the hospital encounter of 03/07/22 (from the past 48 hour(s))  CBC with Differential      Status: Abnormal   Collection Time: 03/07/22  2:54 PM  Result Value Ref Range   WBC 11.7 (H) 4.0 - 10.5 K/uL   RBC 3.44 (L) 3.87 - 5.11 MIL/uL   Hemoglobin 9.7 (L) 12.0 - 15.0 g/dL   HCT 29.1 (L) 36.0 - 46.0 %   MCV 84.6 80.0 - 100.0 fL   MCH 28.2 26.0 - 34.0 pg   MCHC 33.3 30.0 - 36.0 g/dL   RDW 16.4 (H) 11.5 - 15.5 %   Platelets 421 (H) 150 - 400 K/uL    Comment: PLATELET CLUMPS NOTED ON SMEAR, COUNT APPEARS ADEQUATE   nRBC 0.0 0.0 - 0.2 %   Neutrophils Relative % 78 %   Neutro Abs 9.3 (H) 1.7 - 7.7 K/uL   Lymphocytes Relative 4 %   Lymphs Abs 0.4 (L) 0.7 - 4.0 K/uL   Monocytes Relative 13 %   Monocytes Absolute 1.5 (H) 0.1 - 1.0 K/uL   Eosinophils Relative 1 %   Eosinophils Absolute 0.1 0.0 - 0.5 K/uL   Basophils Relative 0 %   Basophils Absolute 0.0 0.0 - 0.1 K/uL   Immature Granulocytes 4 %   Abs Immature Granulocytes 0.45 (H) 0.00 - 0.07 K/uL    Comment: Performed at [facility], [address], [place], [place] [address]  Comprehensive metabolic panel     Status: Abnormal   Collection Time: 03/07/22  2:54 PM  Result Value Ref Range   Sodium 130 (L) 135 - 145 mmol/L   Potassium 4.3 3.5 - 5.1 mmol/L   Chloride 92 (L) 98 - 111 mmol/L   CO2 23 22 - 32 mmol/L   Glucose, Bld 217 (H) 70 - 99 mg/dL    Comment: Glucose reference range applies only to samples taken after fasting for at least 8 hours.   BUN 21 8 - 23 mg/dL   Creatinine, Ser 1.58 (H) 0.44 - 1.00 mg/dL   Calcium 8.2 (L) 8.9 - 10.3 mg/dL   Total Protein 5.3 (L) 6.5 - 8.1 g/dL   Albumin 1.8 (L) 3.5 - 5.0 g/dL   AST 20 15 - 41 U/L   ALT 21 0 - 44 U/L   Alkaline Phosphatase 75 38 - 126 U/L   Total Bilirubin 0.7 0.3 - 1.2 mg/dL   GFR, Estimated 35 (L) >60 mL/min    Comment: (NOTE) Calculated using the CKD-EPI Creatinine Equation (2021)    Anion gap 15 5 - 15    Comment: Performed at [facility], [place] [address], [place], [place] [address]  Lactic acid, plasma     Status: None   Collection  Time: 03/07/22  7:42 PM  Result Value Ref Range  Lactic Acid, Venous 1.6 0.5 - 1.9 mmol/L    Comment: Performed at [facility], [place] [address], [place], [place] [address]  Magnesium     Status: None   Collection Time: 03/07/22  7:42 PM  Result Value Ref Range   Magnesium 2.1 1.7 - 2.4 mg/dL    Comment: Performed at [facility], [place] [address], [place], [place] [address]  Lactic acid, plasma     Status: None   Collection Time: 03/07/22  9:03 PM  Result Value Ref Range   Lactic Acid, Venous 1.1 0.5 - 1.9 mmol/L    Comment: Performed at [facility] [address], [place], [place] [address]  Lactoferrin, Fecal, Qualitative     Status: Abnormal   Collection Time: 03/07/22 10:08 PM  Result Value Ref Range   Lactoferrin, Fecal, Qual POSITIVE (A) NEGATIVE    Comment: Performed at [facility], [address] [place], [place] [address]  CBG monitoring, ED     Status: Abnormal   Collection Time: 03/07/22 11:17 PM  Result Value Ref Range   Glucose-Capillary 161 (H) 70 - 99 mg/dL    Comment: Glucose reference range applies only to samples taken after fasting for at least 8 hours.  Basic metabolic panel     Status: Abnormal   Collection Time: 03/08/22  1:50 AM  Result Value Ref Range   Sodium 131 (L) 135 - 145 mmol/L   Potassium 4.0 3.5 - 5.1 mmol/L   Chloride 95 (L) 98 - 111 mmol/L   CO2 20 (L) 22 - 32 mmol/L   Glucose, Bld 181 (H) 70 - 99 mg/dL    Comment: Glucose reference range applies only to samples taken after fasting for at least 8 hours.   BUN 28 (H) 8 - 23 mg/dL   Creatinine, Ser 1.90 (H) 0.44 - 1.00 mg/dL   Calcium 8.2 (L) 8.9 - 10.3 mg/dL   GFR, Estimated 28 (L) >60 mL/min    Comment: (NOTE) Calculated using the CKD-EPI Creatinine Equation (2021)    Anion gap 16 (H) 5 - 15    Comment: Performed at [facility] [address] [place], [place] [address]  CBC     Status: Abnormal   Collection Time: 03/08/22  1:50 AM  Result Value Ref  Range   WBC 10.9 (H) 4.0 - 10.5 K/uL   RBC 3.11 (L) 3.87 - 5.11 MIL/uL   Hemoglobin 8.8 (L) 12.0 - 15.0 g/dL   HCT 26.2 (L) 36.0 - 46.0 %   MCV 84.2 80.0 - 100.0 fL   MCH 28.3 26.0 - 34.0 pg   MCHC 33.6 30.0 - 36.0 g/dL   RDW 16.6 (H) 11.5 - 15.5 %   Platelets 444 (H) 150 - 400 K/uL   nRBC 0.0 0.0 - 0.2 %    Comment: Performed at [facility] [address], [place], [place] [address]  CBG monitoring, ED     Status: Abnormal   Collection Time: 03/08/22  8:07 AM  Result Value Ref Range   Glucose-Capillary 134 (H) 70 - 99 mg/dL    Comment: Glucose reference range applies only to samples taken after fasting for at least 8 hours.    DG Abd 1 View  Result Date: 03/08/2022 CLINICAL DATA:  66 year old female with history of diarrhea. EXAM: ABDOMEN - 1 VIEW COMPARISON:  Chest x-ray 02/28/2022. FINDINGS: There is relative paucity of bowel gas throughout the abdomen, with the exception of  a prominent loop of gas-filled bowel in the upper abdomen, which appears to represent the transverse colon, measuring up to 7.2 cm in diameter. No distal rectal gas is noted. No definite pneumoperitoneum. IMPRESSION: 1. Unusual but nonobstructive bowel gas pattern, as above. 2. No pneumoperitoneum. Electronically Signed   By: Vinnie Langton M.D.   On: 03/08/2022 06:29   US RENAL  Result Date: 03/08/2022 CLINICAL DATA:  66 year old female with history of acute kidney injury. EXAM: RENAL / URINARY TRACT ULTRASOUND COMPLETE COMPARISON:  No prior ultrasound. CT of the abdomen and pelvis 03/01/2022. FINDINGS: Right Kidney: Renal measurements: 11.1 x 5.0 x 5.4 = volume: 156.3 mL. Echogenicity within normal limits. No mass or hydronephrosis visualized. Left Kidney: Renal measurements: 11.8 x 6.1 x 5.5 = volume: 205.1 mL. Echogenicity within normal limits. No mass or hydronephrosis visualized. Bladder: Appears normal for degree of bladder distention. Other: None. IMPRESSION: 1. No acute findings. Specifically, no  hydroureteronephrosis. Sonographic appearance of the kidneys is within normal limits. Electronically Signed   By: Vinnie Langton M.D.   On: 03/08/2022 05:17    Review of Systems negative except above Blood pressure 131/63, pulse (!) 112, temperature 97.7 F (36.5 C), temperature source Oral, resp. rate 18, SpO2 96 %. Physical Exam vital signs stable afebrile no acute distress in good spirits she looks better than her story sounds exam pertinent for abdomen being soft minimal tender throughout no guarding or rebound x-ray reviewed await CT increased BUN and creatinine white count and hemoglobin not too bad  Assessment/Plan: Infectious diarrhea now with some bleeding Plan: Await CT scan as well as repeat stool studies and might need infectious disease consult pending those results otherwise we will follow with you and certainly if significant abnormality on CT condition worsens consider surgical consult otherwise might need a flex sig or colonoscopy in the near future we will allow clear liquids in the meantime and watch I's and O's to keep up with hydration  Keaten Mashek E 03/08/2022, 9:18 AM

## 2022-03-08 NOTE — Hospital Course (Addendum)
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 She has had improvement in her symptoms have improved with abx and budesonide.    Colitis, inflammatory vs infectious Patient presented to [facility] after recent admission where she was found to have colitis due to EPEC E. Coli returning with similar symptoms of diarrhea, abdominal pain, nausea, poor po intake. She completed outpatient Cipro after first hospital admission. Repeat GI panel showed EPEC positive still. Negative C. Diff. GI performed flex sigmoidoscopy which showed hemorrhoids and multiple ulcerations in sigmoid and descending colon. Biopsy results showed nonspecific changes but possibly ischemic injury. Previous workup include positive lactoferrin, negative TTG IgA and stool ova and parasites, normal pancreatic elastase and TSH. Autoimmune workup earlier this year was negative. She was started on imodium and ID was consulted for short course of antibiotics. GI evaluated and added on budesonide with improvement in her symptoms. At time of discharge, patient reports 2-3 episodes of non-bloody diarrhea. Plan to discharge with budesonide taper with outpatient GI follow up.   Paroxysmal atrial fibrillation not on anticoagulation Hypertension Home losartan, diltiazem and metoprolol were restarted in hospital.    Acute on chronic anemia due to blood loss, stable Patient presented with multiple bloody stools. Hgb dropped to 6.6 during course and received 2 units PRBC. Hgb has remained stable. Patient denies any further bloody stools and no other signs of bleeding.   Hypokalemia, resolved Hypovolemic hyponatremia,  resolved Hypoalbuminemia, improving Electrolyte derangements likely due to poor po intake and frequent diarrhea. Improved as diarrhea lessened and better oral intake.    Thrombocytosis Likely reactive to infection or inflammation given ongoing diarrhea and episodes of bloody stools. Platelets are trending down at time of discharge.   Acute kidney injury, resolved Anion gap metabolic acidosis, resolved sCr 1.58 w/ GFR 35 on arrival in the setting of poor po intake and diarrhea, suspicious for pre-renal etiology. Renal ultrasound normal. Cr improved as increased po intake.   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 Irene Limbo and last note states she has completed Bendamustine Rituxan chemotherapy followed by 18 months of Rituxan maintenance with last dose on 02/25/2021. She will continue to follow up in outpatient setting.

## 2022-03-09 DIAGNOSIS — A09 Infectious gastroenteritis and colitis, unspecified: Secondary | ICD-10-CM | POA: Diagnosis not present

## 2022-03-09 DIAGNOSIS — B9620 Unspecified Escherichia coli [E. coli] as the cause of diseases classified elsewhere: Secondary | ICD-10-CM | POA: Diagnosis not present

## 2022-03-09 LAB — CBC WITH DIFFERENTIAL/PLATELET
Abs Immature Granulocytes: 0.1 10*3/uL — ABNORMAL HIGH (ref 0.00–0.07)
Basophils Absolute: 0 10*3/uL (ref 0.0–0.1)
Basophils Relative: 0 %
Eosinophils Absolute: 0.2 10*3/uL (ref 0.0–0.5)
Eosinophils Relative: 2 %
HCT: 22.1 % — ABNORMAL LOW (ref 36.0–46.0)
Hemoglobin: 7.4 g/dL — ABNORMAL LOW (ref 12.0–15.0)
Lymphocytes Relative: 4 %
Lymphs Abs: 0.4 10*3/uL — ABNORMAL LOW (ref 0.7–4.0)
MCH: 27.7 pg (ref 26.0–34.0)
MCHC: 33.5 g/dL (ref 30.0–36.0)
MCV: 82.8 fL (ref 80.0–100.0)
Monocytes Absolute: 1.1 10*3/uL — ABNORMAL HIGH (ref 0.1–1.0)
Monocytes Relative: 11 %
Myelocytes: 1 %
Neutro Abs: 8 10*3/uL — ABNORMAL HIGH (ref 1.7–7.7)
Neutrophils Relative %: 82 %
Platelets: 368 10*3/uL (ref 150–400)
RBC: 2.67 MIL/uL — ABNORMAL LOW (ref 3.87–5.11)
RDW: 16.5 % — ABNORMAL HIGH (ref 11.5–15.5)
WBC: 9.8 10*3/uL (ref 4.0–10.5)
nRBC: 0 % (ref 0.0–0.2)
nRBC: 0 /100 WBC

## 2022-03-09 LAB — GLUCOSE, CAPILLARY
Glucose-Capillary: 143 mg/dL — ABNORMAL HIGH (ref 70–99)
Glucose-Capillary: 111 mg/dL — ABNORMAL HIGH (ref 70–99)
Glucose-Capillary: 208 mg/dL — ABNORMAL HIGH (ref 70–99)
Glucose-Capillary: 154 mg/dL — ABNORMAL HIGH (ref 70–99)

## 2022-03-09 LAB — CBC
HCT: 24.9 % — ABNORMAL LOW (ref 36.0–46.0)
Hemoglobin: 7.9 g/dL — ABNORMAL LOW (ref 12.0–15.0)
MCH: 27.4 pg (ref 26.0–34.0)
MCHC: 31.7 g/dL (ref 30.0–36.0)
MCV: 86.5 fL (ref 80.0–100.0)
Platelets: 422 10*3/uL — ABNORMAL HIGH (ref 150–400)
RBC: 2.88 MIL/uL — ABNORMAL LOW (ref 3.87–5.11)
RDW: 16.5 % — ABNORMAL HIGH (ref 11.5–15.5)
WBC: 7.9 10*3/uL (ref 4.0–10.5)
nRBC: 0 % (ref 0.0–0.2)

## 2022-03-09 LAB — COMPREHENSIVE METABOLIC PANEL
ALT: 18 U/L (ref 0–44)
AST: 12 U/L — ABNORMAL LOW (ref 15–41)
Albumin: <1.5 g/dL — ABNORMAL LOW (ref 3.5–5.0)
Alkaline Phosphatase: 61 U/L (ref 38–126)
Anion gap: 9 (ref 5–15)
BUN: 14 mg/dL (ref 8–23)
CO2: 23 mmol/L (ref 22–32)
Calcium: 7.7 mg/dL — ABNORMAL LOW (ref 8.9–10.3)
Chloride: 101 mmol/L (ref 98–111)
Creatinine, Ser: 1 mg/dL (ref 0.44–1.00)
GFR, Estimated: >60 mL/min (ref >60)
Glucose, Bld: 136 mg/dL — ABNORMAL HIGH (ref 70–99)
Potassium: 3.2 mmol/L — ABNORMAL LOW (ref 3.5–5.1)
Sodium: 133 mmol/L — ABNORMAL LOW (ref 135–145)
Total Bilirubin: 0.7 mg/dL (ref 0.3–1.2)
Total Protein: 4.1 g/dL — ABNORMAL LOW (ref 6.5–8.1)

## 2022-03-09 LAB — GASTROINTESTINAL PANEL BY PCR, STOOL (REPLACES STOOL CULTURE)

## 2022-03-09 MED ORDER — POTASSIUM CHLORIDE CRYS ER 20 MEQ PO TBCR
40.0000 meq | EXTENDED_RELEASE_TABLET | Freq: Two times a day (BID) | ORAL | Status: AC
  Administered 2022-03-09 (×2): 40 meq via ORAL
  Filled 2022-03-09 (×2): qty 2

## 2022-03-09 MED ORDER — ACETAMINOPHEN 325 MG PO TABS
650.0000 mg | ORAL_TABLET | Freq: Once | ORAL | Status: AC
  Administered 2022-03-09: 650 mg via ORAL
  Filled 2022-03-09: qty 2

## 2022-03-09 MED ORDER — AZITHROMYCIN 250 MG PO TABS
500.0000 mg | ORAL_TABLET | Freq: Every day | ORAL | Status: AC
  Administered 2022-03-09 – 2022-03-11 (×3): 500 mg via ORAL
  Filled 2022-03-09 (×3): qty 2

## 2022-03-09 MED ORDER — SODIUM CHLORIDE 0.9 % IV SOLN: INTRAVENOUS | Status: DC

## 2022-03-09 NOTE — Consult Note (Addendum)
[name] for Infectious Disease    Date of Admission:  03/07/2022   Total days of inpatient antibiotics 0        Reason for Consult:     Principal Problem:   Infectious colitis   Assessment: 10 YF with  follicular lymphoma SP chemotherapy on Rituxan(on hold x1 year) c/b drug induced ILD presents fro chronic diarrhea. Last admitted 11/6-11/8 for dirrhea, GIP+ EPEc treated with cipro+ metro x 5 days. She returns as symptoms have not improved   #Chronic diarrhea-bloody stools #GIP + EPEC #ILD recently completed prednisone taper #Bright red stool per rectum with acute anemia - Afebrile, wbc 11.7k on admission. CT showed worsening colitis. Pt notes bloody stools following discharge on 11/8 GI engaged. -ID engaged as cdifff negative and GIP +EPEC again. EPEC may stay positive as it is a PCR test and may not reflect pathogenicity. Regardless, will do a round of azithromycin in case it is contributing to her clinical syndrome. Given chronicity of diarrhea, I suspect that it is not the EPEC leading to clinical syndrome.  -Of note she has been off and on prednisone since January, recently completed taper 2 weeks ago.  Recommendations:  -Azithromycin '500mg'$  daily x 3 days -Stool O&P -GI engaged and plan on flex sig tomorrow  ID will sign off, please engage with any question or concerns.    I have personally spent 92 minutes involved in face-to-face and non-face-to-face activities for this patient on the day of the visit. Professional time spent includes the following activities: Preparing to see the patient (review of tests), Obtaining and/or reviewing separately obtained history (admission/discharge record), Performing a medically appropriate examination and/or evaluation , Ordering medications/tests/procedures, referring and communicating with other health care professionals, Documenting clinical information in the EMR, Independently interpreting results (not separately  reported), Communicating results to the patient/family/caregiver, Counseling and educating the patient/family/caregiver and Care coordination (not separately reported).   Microbiology:   Antibiotics: Ciprofloxacin metronidazole 11/7-11/11    HPI: Meghan Alexander is a 66 y.o. female PAF not on AV, DM, esophageal achalasia, follicular lymphoma SP chemotherapy on Rituxan c/b drug induced ILD is presented back to [facility] after discharge for similar symptoms 5 day ago. Last hospitalization(11/6-8), pt presented with non-bloody diarrhea for about a month. She had reported 10-18 episodes of bloody diarrhea after eating at a [employer]. She had tem of 100.8 and wbc 18.7. GIP+ EPEC, cdiff negative.  treated with cipro + flagyl and discharge on same antibiotics to complete 5 days.  She returns with abdominal pain, poor oral intake, nausea and diarrhea despite completing a course of antibiotics.  She has watery/yellow stools a few times a day.  On arrival to the ED, patient afebrile without leukocytosis.  CT showed diffuse colonic wall thickening with adjacent stranding which has increased since recent CT consistent with worsening colitis ID engaged as GIP returned positive for EPEK, C. difficile negative. Review of Systems: Review of Systems  All other systems reviewed and are negative.   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 ([place]) 04/02/2019   GERD (gastroesophageal reflux disease)    hx of reflux-went away with elevating HOB    Hypertension    Pneumonia  hx of    PONV (postoperative nausea and vomiting)    Type 2 diabetes mellitus ([place]) 08/02/2021    Social History   Tobacco Use   Smoking status: Never   Smokeless tobacco: Never  Vaping Use   Vaping Use: Never used   Substance Use Topics   Alcohol use: No    Alcohol/week: 0.0 standard drinks of alcohol   Drug use: No    Family History  Problem Relation Age of Onset   Lung cancer Father        07/29/80 died from it   Hernia Mother    Breast cancer Maternal Aunt        multiple    Breast cancer Maternal Grandmother    Breast cancer Paternal Grandmother    Breast cancer Cousin        cousins multiple    Colon cancer Neg Hx    Colon polyps Neg Hx    Rectal cancer Neg Hx    Stomach cancer Neg Hx    Scheduled Meds:  diltiazem  240 mg Oral Daily   enoxaparin (LOVENOX) injection  40 mg Subcutaneous Q24H   insulin aspart  0-15 Units Subcutaneous TID WC   insulin glargine-yfgn  10 Units Subcutaneous QHS   metoprolol succinate  25 mg Oral Daily   mometasone-formoterol  2 puff Inhalation BID   potassium chloride  40 mEq Oral BID   Continuous Infusions: PRN Meds:. Allergies  Allergen Reactions   Latex Other (See Comments)    Blisters / skin peeling   Codeine Nausea And Vomiting and Other (See Comments)    hallucinations   Atenolol Cough   Other Other (See Comments)    DermaPlast; Used after surgery - caused swelling, itching, and wound broke open   Hydrocodone Nausea And Vomiting   Oxycodone Nausea And Vomiting    OBJECTIVE: Blood pressure 121/65, pulse 97, temperature 98.2 F (36.8 C), temperature source Oral, resp. rate 16, height '5\' 2"'$  (1.575 m), weight 77.1 kg, SpO2 96 %.  Physical Exam Constitutional:      Appearance: Normal appearance.  HENT:     Head: Normocephalic and atraumatic.     Right Ear: Tympanic membrane normal.     Left Ear: Tympanic membrane normal.     Nose: Nose normal.     Mouth/Throat:     Mouth: Mucous membranes are moist.  Eyes:     Extraocular Movements: Extraocular movements intact.     Conjunctiva/sclera: Conjunctivae normal.     Pupils: Pupils are equal, round, and reactive to light.  Cardiovascular:     Rate and Rhythm: Normal rate and regular rhythm.      Heart sounds: No murmur heard.    No friction rub. No gallop.  Pulmonary:     Effort: Pulmonary effort is normal.     Breath sounds: Normal breath sounds.  Abdominal:     General: Abdomen is flat.     Palpations: Abdomen is soft.     Tenderness: There is abdominal tenderness.  Musculoskeletal:        General: Normal range of motion.  Skin:    General: Skin is warm and dry.  Neurological:     General: No focal deficit present.     Mental Status: She is alert and oriented to person, place, and time.  Psychiatric:        Mood and Affect: Mood normal.     Lab Results Lab Results  Component Value Date   WBC 9.8 03/09/2022  HGB 7.4 (L) 03/09/2022   HCT 22.1 (L) 03/09/2022   MCV 82.8 03/09/2022   PLT 368 03/09/2022    Lab Results  Component Value Date   CREATININE 1.00 03/09/2022   BUN 14 03/09/2022   NA 133 (L) 03/09/2022   K 3.2 (L) 03/09/2022   CL 101 03/09/2022   CO2 23 03/09/2022    Lab Results  Component Value Date   ALT 18 03/09/2022   AST 12 (L) 03/09/2022   ALKPHOS 61 03/09/2022   BILITOT 0.7 03/09/2022       Laurice Record, [facility] 03/09/2022, 11:00 AM

## 2022-03-09 NOTE — Progress Notes (Addendum)
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  Overnight Events: NAEO  Feels about the same this morning. Reports BM this morning completely bloody. Bloody diarrhea 10x yesterday. And today has some clots. No n/v. Endorses lower abdominal pain still. Sleeping a lot, doesn't feel like drinking.  Less urine but drinking less. Not very thirsty. Was eating at home but only a walnut size amount of food 2-3x/d for a few days before going to ER due to no appetite. Before that, doesn't really remember how she was eating. Last had normal eating pattern about a week before last admission - and even then wasn't totally normal. Tympanic over midabdomen and pain with tmpany. Pain is bad over rlq, not so much l side. D/ced lovenox for nwo.  Objective:  Vital signs in last 24 hours: Vitals:   03/09/22 0042 03/09/22 0551 03/09/22 0600 03/09/22 0748  BP: (!) 120/58 136/61 (!) 130/59 121/65  Pulse: (!) 103 (!) 112 (!) 105 97  Resp:  '19 18 16  '$ Temp: 98.4 F (36.9 C) 97.9 F (36.6 C) 97.8 F (36.6 C) 98.2 F (36.8 C)  TempSrc: Oral Oral Oral Oral  SpO2: 94% 95% 98% 96%  Weight:      Height:       Supplemental O2: Room Air SpO2: 96 %   Physical Exam:  Constitutional: ill-appearing female sitting in hospital bed, in no acute distress HENT: normocephalic atraumatic, mucous membranes moist Eyes: conjunctiva non-erythematous Neck: supple Cardiovascular: III/VI systolic murmur at LUSB, extremities warm and well perfused Pulmonary/Chest: normal work of breathing on room air, crackles at bilateral bases Abdominal: soft, distended, tympanic at mid abdomen with pain  on tympany, right lower quadrant tenderness. No rebound tenderness or guarding. MSK: normal bulk and tone Neurological: alert & oriented x 3 Skin: warm and dry Psych: Normal mood and affect  Filed Weights   03/08/22 1510  Weight: 77.1 kg   No intake or output data in the 24 hours ending 03/09/22 1444 Net IO Since Admission: No IO data has been entered for this period [03/09/22 1444]  Pertinent Labs:    Latest Ref Rng & Units 03/09/2022    4:01 AM 03/08/2022    1:50 AM 03/07/2022    2:54 PM  CBC  WBC 4.0 - 10.5 K/uL 9.8  10.9  11.7   Hemoglobin 12.0 - 15.0 g/dL 7.4  8.8  9.7   Hematocrit 36.0 - 46.0 % 22.1  26.2  29.1   Platelets 150 - 400 K/uL 368  444  421        Latest Ref Rng & Units 03/09/2022    4:01 AM 03/08/2022   12:24 PM 03/08/2022    1:50 AM  CMP  Glucose 70 - 99 mg/dL 136  114  181   BUN 8 - 23 mg/dL '14  22  28   '$ Creatinine 0.44 - 1.00 mg/dL 1.00  1.35  1.90   Sodium 135 - 145 mmol/L 133  135  131   Potassium 3.5 - 5.1 mmol/L 3.2  3.4  4.0   Chloride 98 - 111 mmol/L 101  102  95   CO2 22 - 32 mmol/L '23  23  20   '$ Calcium 8.9 - 10.3 mg/dL 7.7  8.2  8.2   Total Protein 6.5 - 8.1 g/dL 4.1     Total Bilirubin 0.3 - 1.2 mg/dL 0.7     Alkaline Phos 38 - 126 U/L 61     AST 15 - 41 U/L 12     ALT 0 - 44 U/L 18      Imaging: No results found.  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 coli admitted 11/13 with worsening abdominal pain and swelling concerning for worsening colitis along with acute kidney injury.   #Colitis, suspect 2/2 enteropathogenic E. Coli Stool positive for EPEC last admission and discharged with ciprofloxacin 5 day  course which she completed. Now reporting blood in stool which is not typical for EPEC. CTAP with worsened colitis.  Currently afebrile, normotensive, mild tachycardia with resolved leukocytosis. GI panel redemonstrated EPEC. Lactoferrin positive. C diff panel negative. ID consulted, recommend azythromycin. GI planning flex sig tomorrow. Patient on CLD.  - GI consulted, appreciate recommendations - ID consulted, appreciate recommendations - add azythromycin 500 mg daily x 3 days - will discuss nutrition on CLD with RD  #Acute kidney injury, improving #Anion gap metabolic acidosis, resolved sCr 1.58 w/ GFR 35 on arrival in the setting of poor po intake and diarrhea, suspicious for pre-renal etiology. Renal ultrasound normal. Electrolytes wnl.  Anion gap closed. Cr now 1.0. - IVF prn - Avoid nephrotoxins - daily BMP   #Type II diabetes mellitus A1c during last admission 7.2%. Fasting glucose at goal today. - Hold home metformin with AKI - Semglee 10 units QHS - SSI   #Paroxysmal atrial fibrillation not on anticoagulation #Hypertension Normotensive. Holding home losartan given AKI. - Hold home losartan - Continue home diltiazem and metoprolol   #Normocytic anemia Patient with multiple bloody stools. GI consulted given concern for GI blood loss. Elevated RDW with low-normocytic anemia, could have underlying iron deficiency as well. Has received iron supplementation in the past. Hgb 7.4, down from 8.8 yesterday. - endoscopy tomorrow - f/u repeat CBC this afternoon - transfuse if hgb <7   #Hypovolemic hyponatremia Na 133. K 3.2. Most likely secondary to dehydration from GI illness.  -supplement with KCL 40 meq x 2 -daily BMP   #Thrombocytosis PLT improved to 368. Initial elevation felt to be reactive due to infection.  -trend daily CBC.   #Hypoalbuminemia Prior to acute illness, albumin normal. Will need to encourage PO intake, could consider protein shake supplements to help.   -consult RD   #History of follicular lymphoma s/p chemotherapy #Drug-induced pneumonitis, previously on steroids Last steroid use prior to hospitalization, she had been on long-term steroid taper. Respiratory status stable, sating well on room air. Exam also re-assuring. We will continue to hold steroids for now.  Ruthe Mannan twice daily   [facility]    DIET: CM IVF: LR DVT PPX: none BOWEL: na CODE: FULL FAM COM: Discussed with patient and her husband at bedside on admission   Linward Natal MD Internal Medicine Resident PGY-1 Please contact the on call pager after 5 pm and on weekends at [phone].

## 2022-03-09 NOTE — Plan of Care (Signed)
  Problem: Pain Managment: Goal: General experience of comfort will improve Outcome: Progressing   Problem: Safety: Goal: Ability to remain free from injury will improve Outcome: Progressing   Problem: Skin Integrity: Goal: Risk for impaired skin integrity will decrease Outcome: Progressing   

## 2022-03-09 NOTE — Care Management (Signed)
  Transition of Care [facility]) Screening Note   Patient Details  Name: Meghan Alexander Date of Birth: May 22, 1955   Transition of Care [facility]) CM/SW Contact:    Carles Collet, RN Phone Number: 03/09/2022, 8:32 AM    Transition of Care Department [facility]) has reviewed patient and no TOC needs have been identified at this time. We will continue to monitor patient advancement through interdisciplinary progression rounds. If new patient transition needs arise, please place a TOC consult.   Infectious colitis, from home w spouse, no needs identified at this time

## 2022-03-09 NOTE — Progress Notes (Signed)
[name] 10:49 AM  Subjective: Patient doing about the same with some mild discomfort as well as some low-volume diarrhea with some fresh blood I did see it in the commode she has no vomiting or fever no new complaints  Objective: Vital signs stable afebrile abdominal exam tender throughout without guarding or rebound CT worsening colitis white count okay hemoglobin decreased BUN and creatinine improved seem a little low and if negative stool studies still positive for E. coli although I do not know how long he will remain positive  Assessment: Colitis  Plan: The risk benefits methods of unprepped flex sig was discussed with the patient and I discussed proceeding with or without sedation but she prefers with sedation which is fine and barring a change will proceed tomorrow morning and in the meantime continue present management and I would recommend rounding her case by infectious disease to confirm no antibiotics are needed currently and to confirm that the stool test can remain positive for some time and continue clear liquids in the meantime and adding supplements is okay with me  [facility]  office [phone] After 5PM or if no answer call [phone]

## 2022-03-10 ENCOUNTER — Inpatient Hospital Stay (HOSPITAL_COMMUNITY): Payer: Medicare Other | Admitting: Anesthesiology

## 2022-03-10 ENCOUNTER — Encounter (HOSPITAL_COMMUNITY)
Admission: EM | Disposition: A | Discharge status: Home / Self Care | Payer: Self-pay | Source: Ambulatory Visit | Attending: Internal Medicine

## 2022-03-10 ENCOUNTER — Encounter (HOSPITAL_COMMUNITY): Payer: Self-pay | Admitting: Internal Medicine

## 2022-03-10 DIAGNOSIS — K633 Ulcer of intestine: Secondary | ICD-10-CM

## 2022-03-10 DIAGNOSIS — K6389 Other specified diseases of intestine: Secondary | ICD-10-CM

## 2022-03-10 DIAGNOSIS — B9620 Unspecified Escherichia coli [E. coli] as the cause of diseases classified elsewhere: Secondary | ICD-10-CM | POA: Diagnosis not present

## 2022-03-10 DIAGNOSIS — K6289 Other specified diseases of anus and rectum: Secondary | ICD-10-CM

## 2022-03-10 DIAGNOSIS — K529 Noninfective gastroenteritis and colitis, unspecified: Secondary | ICD-10-CM

## 2022-03-10 DIAGNOSIS — K648 Other hemorrhoids: Secondary | ICD-10-CM

## 2022-03-10 DIAGNOSIS — A09 Infectious gastroenteritis and colitis, unspecified: Secondary | ICD-10-CM | POA: Diagnosis not present

## 2022-03-10 HISTORY — PX: FLEXIBLE SIGMOIDOSCOPY: SHX5431

## 2022-03-10 HISTORY — PX: BIOPSY: SHX5522

## 2022-03-10 LAB — BASIC METABOLIC PANEL
Anion gap: 9 (ref 5–15)
BUN: 11 mg/dL (ref 8–23)
CO2: 25 mmol/L (ref 22–32)
Calcium: 8 mg/dL — ABNORMAL LOW (ref 8.9–10.3)
Chloride: 101 mmol/L (ref 98–111)
Creatinine, Ser: 0.95 mg/dL (ref 0.44–1.00)
GFR, Estimated: >60 mL/min (ref >60)
Glucose, Bld: 138 mg/dL — ABNORMAL HIGH (ref 70–99)
Potassium: 3.8 mmol/L (ref 3.5–5.1)
Sodium: 135 mmol/L (ref 135–145)

## 2022-03-10 LAB — CBC
HCT: 22.8 % — ABNORMAL LOW (ref 36.0–46.0)
Hemoglobin: 7.5 g/dL — ABNORMAL LOW (ref 12.0–15.0)
MCH: 27.6 pg (ref 26.0–34.0)
MCHC: 32.9 g/dL (ref 30.0–36.0)
MCV: 83.8 fL (ref 80.0–100.0)
Platelets: 415 10*3/uL — ABNORMAL HIGH (ref 150–400)
RBC: 2.72 MIL/uL — ABNORMAL LOW (ref 3.87–5.11)
RDW: 16.5 % — ABNORMAL HIGH (ref 11.5–15.5)
WBC: 10.6 10*3/uL — ABNORMAL HIGH (ref 4.0–10.5)
nRBC: 0 % (ref 0.0–0.2)

## 2022-03-10 LAB — GLUCOSE, CAPILLARY
Glucose-Capillary: 142 mg/dL — ABNORMAL HIGH (ref 70–99)
Glucose-Capillary: 143 mg/dL — ABNORMAL HIGH (ref 70–99)
Glucose-Capillary: 167 mg/dL — ABNORMAL HIGH (ref 70–99)
Glucose-Capillary: 161 mg/dL — ABNORMAL HIGH (ref 70–99)
Glucose-Capillary: 224 mg/dL — ABNORMAL HIGH (ref 70–99)

## 2022-03-10 SURGERY — SIGMOIDOSCOPY, FLEXIBLE
Anesthesia: Monitor Anesthesia Care

## 2022-03-10 MED ORDER — LIDOCAINE 2% (20 MG/ML) 5 ML SYRINGE
INTRAMUSCULAR | Status: DC | PRN
  Administered 2022-03-10: 40 mg via INTRAVENOUS

## 2022-03-10 MED ORDER — PROPOFOL 500 MG/50ML IV EMUL
INTRAVENOUS | Status: DC | PRN
  Administered 2022-03-10: 80 ug/kg/min via INTRAVENOUS

## 2022-03-10 MED ORDER — ADULT MULTIVITAMIN W/MINERALS CH
1.0000 | ORAL_TABLET | Freq: Every day | ORAL | Status: DC
  Administered 2022-03-10 – 2022-03-22 (×11): 1 via ORAL
  Filled 2022-03-10 (×13): qty 1

## 2022-03-10 MED ORDER — LACTATED RINGERS IV SOLN: INTRAVENOUS | Status: DC

## 2022-03-10 MED ORDER — PROPOFOL 10 MG/ML IV BOLUS
INTRAVENOUS | Status: DC | PRN
  Administered 2022-03-10: 40 mg via INTRAVENOUS

## 2022-03-10 MED ORDER — ENSURE ENLIVE PO LIQD
237.0000 mL | Freq: Two times a day (BID) | ORAL | Status: DC
  Administered 2022-03-11 – 2022-03-14 (×7): 237 mL via ORAL

## 2022-03-10 MED ORDER — PHENYLEPHRINE 80 MCG/ML (10ML) SYRINGE FOR IV PUSH (FOR BLOOD PRESSURE SUPPORT)
PREFILLED_SYRINGE | INTRAVENOUS | Status: DC | PRN
  Administered 2022-03-10: 80 ug via INTRAVENOUS

## 2022-03-10 NOTE — Anesthesia Procedure Notes (Signed)
Procedure Name: MAC Date/Time: 03/10/2022 9:58 AM  Performed by: Lorie Phenix, CRNAPre-anesthesia Checklist: Patient identified, Emergency Drugs available, Suction available and Patient being monitored Patient Re-evaluated:Patient Re-evaluated prior to induction Oxygen Delivery Method: Simple face mask Preoxygenation: Pre-oxygenation with 100% oxygen Placement Confirmation: positive ETCO2

## 2022-03-10 NOTE — Op Note (Signed)
[facility] Patient Name: Meghan Alexander Procedure Date : 03/10/2022 MRN: [id number] Attending MD: Clarene Essex , MD, [id number] Date of Birth: November 01, 1955 CSN: [id number] Age: 66 Admit Type: Inpatient Procedure:                Flexible Sigmoidoscopy Indications:              Hematochezia, Diarrhea of presumed infectious origin Providers:                Clarene Essex, MD, Mikey College, RN, Benetta Spar, Technician Referring MD:              Medicines:                Monitored Anesthesia Care Complications:            No immediate complications. Estimated Blood Loss:     Estimated blood loss: none. Procedure:                Pre-Anesthesia Assessment:                           - Prior to the procedure, a History and Physical                            was performed, and patient medications and                            allergies were reviewed. The patient's tolerance of                            previous anesthesia was also reviewed. The risks                            and benefits of the procedure and the sedation                            options and risks were discussed with the patient.                            All questions were answered, and informed consent                            was obtained. Prior Anticoagulants: The patient has                            taken no anticoagulant or antiplatelet agents. ASA                            Grade Assessment: II - A patient with mild systemic                            disease. After reviewing the risks and benefits,  the patient was deemed in satisfactory condition to                            undergo the procedure.                           After obtaining informed consent, the scope was                            passed under direct vision. The [id number]-HQ190TL                            ([id number] Olympus peds colonoscope was introduced                             through the anus and advanced to the the descending                            colon. The flexible sigmoidoscopy was accomplished                            without difficulty. The patient tolerated the                            procedure well. The quality of the bowel                            preparation was adequate. Scope In: Scope Out: Findings:      External and internal hemorrhoids were found during retroflexion, during       perianal exam and during digital exam. The hemorrhoids were small.      An area of mildly congested mucosa was found in the rectum, in the       recto-sigmoid colon and in the distal sigmoid colon. This was biopsied       with a cold forceps for histology.      A diffuse area of moderately erythematous, eroded, furrowed, inflamed,       ulcerated and vascular-pattern-decreased mucosa was found in the       proximal sigmoid colon, in the mid sigmoid colon, in the mid descending       colon and in the distal descending colon. Biopsies were taken with a       cold forceps for histology.      The exam was otherwise without abnormality. Impression:               - External and internal hemorrhoids.                           - Congested mucosa in the rectum, in the                            recto-sigmoid colon and in the distal sigmoid                            colon. Biopsied.                           -  Erythematous, eroded, furrowed, inflamed,                            ulcerated and vascular-pattern-decreased mucosa in                            the proximal sigmoid colon, in the mid sigmoid                            colon, in the mid descending colon and in the                            distal descending colon. Biopsied.                           - The examination was otherwise normal. Recommendation:           - Full liquid diet today. Okay to use supplements                            like Ensure and boost but return to clear liquids                             as needed                           - Continue present medications.                           - Await pathology results. Will send ASAP to                            include viral stains                           - Return to GI clinic PRN.                           - Telephone GI clinic for pathology results in 3                            days.                           - Telephone GI clinic if symptomatic PRN. Procedure Code(s):        --- Professional ---                           [phone], Sigmoidoscopy, flexible; with biopsy, single                            or multiple Diagnosis Code(s):        --- Professional ---                           K62.89, Other specified diseases of anus and rectum  K63.89, Other specified diseases of intestine                           K52.9, Noninfective gastroenteritis and colitis,                            unspecified                           K63.3, Ulcer of intestine                           K64.8, Other hemorrhoids                           K92.1, Melena (includes Hematochezia)                           R19.7, Diarrhea, unspecified CPT copyright 2022 [employer]. All rights reserved. The codes documented in this report are preliminary and upon coder review may  be revised to meet current compliance requirements. Clarene Essex, MD 03/10/2022 10:28:54 AM This report has been signed electronically. Number of Addenda: 0

## 2022-03-10 NOTE — Progress Notes (Signed)
Initial Nutrition Assessment  DOCUMENTATION CODES:   Not applicable  INTERVENTION:   Ensure Enlive po BID, each supplement provides 350 kcal and 20 grams of protein. Multivitamin w/ minerals daily Advance diet as medically appropriate  NUTRITION DIAGNOSIS:   Inadequate oral intake related to decreased appetite as evidenced by per patient/family report.  GOAL:   Patient will meet greater than or equal to 90% of their needs  MONITOR:   PO intake, Supplement acceptance, Diet advancement, Labs  REASON FOR ASSESSMENT:   Consult Assessment of nutrition requirement/status  ASSESSMENT:   66 y.o. female presented to the ED with abdominal pain, poor PO intake, nausea, and diarrhea. Pt recently admitted 11/7-11/8 for infectious colitis due to E. Coli. PMH includes T2DM, HTN, GERD, and follicular lymphoma s/p chemotherapy. Pt admitted with infectious colitis and AKI.   11/16 - flexible sigmoidoscopy  Pt laying in bed, husband at bedside. Reports that her appetite is better and that she wants to eat, but cannot eat much. States that she is full off a little amount. She shared that she had no appetite about a week ago. Denies any nausea or vomiting. Loves Ensure and is agreeable to have while on liquids. Husband bought some food from the cafeteria and RD told what she is allowed and not. Reviewed menu with pt. Pt does not think that she has lost any weight, but is also unsure how much she currently weighs.   Pt with no other questions or concerns at this time.   Medications reviewed and include: Zithromax, NovoLog SSI, Semglee Labs reviewed: Hgb A1c 7.2%, 24 hr CBGs 111-208  NUTRITION - FOCUSED PHYSICAL EXAM:  Deferred to follow-up.   Diet Order:   Diet Order             Diet full liquid Room service appropriate? Yes; Fluid consistency: Thin  Diet effective now                   EDUCATION NEEDS:   No education needs have been identified at this time  Skin:  Skin  Assessment: Reviewed RN Assessment  Last BM:  11/15  Height:   Ht Readings from Last 1 Encounters:  03/08/22 '5\' 2"'$  (1.575 m)    Weight:   Wt Readings from Last 1 Encounters:  03/08/22 77.1 kg    Ideal Body Weight:  50 kg  BMI:  Body mass index is 31.09 kg/m.  Estimated Nutritional Needs:   Kcal:  1800-2000  Protein:  90-105 grams  Fluid:  >/= 1.8 L    Hermina Barters RD, LDN Clinical Dietitian See [facility] for contact information.

## 2022-03-10 NOTE — Progress Notes (Addendum)
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  Overnight Events: NAEO.  Patient states she feels pretty good today. A little tired after endoscopy. Last bloody bowel movement earlier this morning but better than previous BMs. Has had BM after endoscopy today without blood and did have some solid components. Discussed endoscopy results. Discussed nutrition and patient likes Ensure - passed this along to RN. Able to tolerate yogurt given by husband this morning. Denies abdominal pain or vomiting currently. Endorses some nausea and "spinning" upon waking up but this improved quickly.  Objective:  Vital signs in last 24 hours: Vitals:   03/09/22 0748 03/09/22 1621 03/09/22 2012 03/10/22 0630  BP: 121/65 (!) 146/66 (!) 144/66 (!) 119/53  Pulse: 97 (!) 103 (!) 109 100  Resp: '16 16 17   '$ Temp: 98.2 F (36.8 C) 98.1 F (36.7 C) 99.1 F (37.3 C) 98.8 F (37.1 C)  TempSrc: Oral Oral Oral Oral  SpO2: 96% 99% 97% 100%  Weight:      Height:       Supplemental O2: Room Air SpO2: 100 %   Physical Exam:  Constitutional: well-appearing female sitting in hospital bed, in no acute distress  HENT: normocephalic atraumatic, mucous membranes moist Eyes: conjunctiva non-erythematous Neck: supple Cardiovascular: III/VI systolic murmur at LUSB, extremities warm and well perfused Pulmonary/Chest: normal work of breathing on room air Abdominal: soft, distended, tympanic at mid abdomen, nontender MSK: normal bulk and tone Neurological: alert & oriented x 3 Skin: warm and dry Psych: Normal mood and affect  Filed Weights   03/08/22 1510  Weight: 77.1 kg     Intake/Output Summary (Last 24 hours) at 03/10/2022 0645 Last data filed at 03/10/2022 0343 Gross per 24 hour  Intake 118.7 ml  Output --  Net 118.7 ml   Net IO Since Admission: 118.7 mL [03/10/22 0645]  Pertinent Labs:    Latest Ref Rng & Units 03/10/2022    3:04 AM 03/09/2022    4:30 PM 03/09/2022    4:01 AM  CBC  WBC 4.0 - 10.5 K/uL 10.6  7.9  9.8   Hemoglobin 12.0 - 15.0 g/dL 7.5  7.9  7.4   Hematocrit 36.0 - 46.0 % 22.8  24.9  22.1   Platelets 150 - 400 K/uL 415  422  368        Latest Ref Rng & Units 03/10/2022    3:04 AM 03/09/2022    4:01 AM 03/08/2022   12:24 PM  CMP  Glucose 70 - 99 mg/dL 138  136  114   BUN 8 - 23 mg/dL '11  14  22   '$ Creatinine 0.44 - 1.00 mg/dL 0.95  1.00  1.35   Sodium 135 - 145 mmol/L 135  133  135   Potassium 3.5 - 5.1 mmol/L 3.8  3.2  3.4   Chloride 98 - 111 mmol/L 101  101  102   CO2 22 - 32 mmol/L '25  23  23   '$ Calcium 8.9 - 10.3 mg/dL 8.0  7.7  8.2   Total Protein 6.5 - 8.1 g/dL  4.1    Total Bilirubin 0.3 - 1.2 mg/dL  0.7    Alkaline Phos 38 - 126 U/L  61    AST  15 - 41 U/L  12    ALT 0 - 44 U/L  18     Imaging: No results found.  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 coli admitted 11/13 with worsening abdominal pain and swelling concerning for worsening colitis along with acute kidney injury.   #Colitis, suspect 2/2 enteropathogenic E. Coli #Normocytic anemia #Hypoalbuminemia GI panel redemonstrated EPEC. Lactoferrin positive. C diff panel negative. ID consulted, recommend azythromycin. GI performed flexible sigmoidoscopy this morning. Path results / viral stains pending. Hgb 7.5. HDS. Most  recent stool non-bloody. Currently afebrile, mild tachycardia with mild leukocytosis. - GI consulted, appreciate recommendations  -f/u flex sig pathology results, viral stains  -full liquid diet, Ensure  -f/u ova + parasite - ID consulted, appreciate recommendations - azythromycin 500 mg daily x 3 days - transfuse if hgb <7 - Registered dietician consulted - daily CBC  #Acute kidney injury, improving #Anion gap metabolic acidosis, resolved sCr 1.58 w/ GFR 35 on arrival in the setting of poor po intake and diarrhea, suspicious for pre-renal etiology. Renal ultrasound normal. Cr now 0.95, close to previous baseline. - encourage po intake - Avoid nephrotoxins - daily BMP   #Type II diabetes mellitus A1c during last admission 7.2%. Fasting glucose at goal today. Will follow closely as diet advances - Hold home metformin - Semglee 10 units QHS - SSI   #Paroxysmal atrial fibrillation not on anticoagulation #Hypertension Softer pressure this morning. Holding home losartan. - Restart losartan tomorrow if able - Continue home diltiazem and metoprolol   #Hypovolemic hyponatremia Na 135. K 3.8.  -supplement potassium as needed -daily BMP   #Thrombocytosis PLT 415. Initial elevation felt to be reactive due to infection.  -trend daily CBC.   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 Respiratory status stable, sating well on room air. Exam also re-assuring.  Ruthe Mannan twice daily   [facility]    DIET: CM IVF: LR DVT PPX: none BOWEL: na CODE: FULL FAM COM: Discussed with patient and her husband at [place] MD Internal Medicine Resident PGY-1 Please contact the on call pager after 5 pm and on weekends at [phone].

## 2022-03-10 NOTE — Anesthesia Postprocedure Evaluation (Signed)
Anesthesia Post Note  Patient: Meghan Alexander  Procedure(s) Performed: FLEXIBLE SIGMOIDOSCOPY BIOPSY     Patient location during evaluation: PACU Anesthesia Type: MAC Level of consciousness: awake and alert Pain management: pain level controlled Vital Signs Assessment: post-procedure vital signs reviewed and stable Respiratory status: spontaneous breathing Cardiovascular status: stable Anesthetic complications: no   No notable events documented.  Last Vitals:  Vitals:   03/10/22 1030 03/10/22 1045  BP: 121/67 134/69  Pulse: 90 92  Resp: 17 (!) 23  Temp:  36.7 C  SpO2: 95% 94%    Last Pain:  Vitals:   03/10/22 1045  TempSrc:   PainSc: 0-No pain                 Nolon Nations

## 2022-03-10 NOTE — Anesthesia Preprocedure Evaluation (Addendum)
Anesthesia Evaluation  Patient identified by MRN, date of birth, ID band Patient awake    Reviewed: Allergy & Precautions, NPO status , Patient's Chart, lab work & pertinent test results, reviewed documented beta blocker date and time   History of Anesthesia Complications (+) PONV and history of anesthetic complications  Airway Mallampati: II  TM Distance: >3 FB     Dental  (+) Dental Advisory Given, Teeth Intact   Pulmonary shortness of breath, pneumonia   breath sounds clear to auscultation       Cardiovascular hypertension, Pt. on medications and Pt. on home beta blockers  Rhythm:Regular Rate:Normal  Echo 09/2021  1. Left ventricular ejection fraction, by estimation, is 55 to 60%. The left ventricle has normal function. The left ventricle has no regional wall motion abnormalities. Left ventricular diastolic parameters are indeterminate.   2. Right ventricular systolic function is normal. The right ventricular size is normal.   3. The mitral valve is abnormal. Trivial mitral valve regurgitation. No evidence of mitral stenosis.   4. The aortic valve is tricuspid. Aortic valve regurgitation is not visualized. No aortic stenosis is present.   5. The inferior vena cava is dilated in size with >50% respiratory variability, suggesting right atrial pressure of 8 mmHg.     Neuro/Psych    GI/Hepatic Neg liver ROS,GERD  ,,  Endo/Other  diabetes    Renal/GU negative Renal ROS     Musculoskeletal  (+) Arthritis ,    Abdominal  (+) + obese  Peds  Hematology   Anesthesia Other Findings   Reproductive/Obstetrics                              Anesthesia Physical Anesthesia Plan  ASA: 3  Anesthesia Plan: MAC   Post-op Pain Management: Minimal or no pain anticipated   Induction: Intravenous  PONV Risk Score and Plan: 4 or greater and Ondansetron, Propofol infusion, Treatment may vary due to age or  medical condition and TIVA  Airway Management Planned: Simple Face Mask  Additional Equipment:   Intra-op Plan:   Post-operative Plan:   Informed Consent: I have reviewed the patients History and Physical, chart, labs and discussed the procedure including the risks, benefits and alternatives for the proposed anesthesia with the patient or authorized representative who has indicated his/her understanding and acceptance.     Dental advisory given  Plan Discussed with: CRNA  Anesthesia Plan Comments:          Anesthesia Quick Evaluation

## 2022-03-10 NOTE — Transfer of Care (Signed)
Immediate Anesthesia Transfer of Care Note  Patient: Meghan Alexander  Procedure(s) Performed: FLEXIBLE SIGMOIDOSCOPY BIOPSY  Patient Location: PACU  Anesthesia Type:MAC  Level of Consciousness: awake  Airway & Oxygen Therapy: Patient Spontanous Breathing  Post-op Assessment: Report given to RN and Post -op Vital signs reviewed and stable  Post vital signs: Reviewed and stable  Last Vitals:  Vitals Value Taken Time  BP 119/62 03/10/22 1018  Temp    Pulse 90 03/10/22 1019  Resp 22 03/10/22 1019  SpO2 97 % 03/10/22 1019  Vitals shown include unvalidated device data.  Last Pain:  Vitals:   03/10/22 1000  TempSrc:   PainSc: 0-No pain      Patients Stated Pain Goal: 3 (62/22/97 9892)  Complications: No notable events documented.

## 2022-03-10 NOTE — Progress Notes (Signed)
Meghan Alexander 9:40 AM  Subjective: Patient actually a little better but not sure if it is due to new antibiotics or not drinking anything no new symptoms  Objective: Signs stable afebrile no acute distress exam please see preassessment evaluation pain seems to be a little less Chemistries okay CBC stable Assessment: Colitis questionable etiology  Plan: Okay to proceed with flex sig with anesthesia assistance further work-up and plans pending those findings  [facility] E  office [phone] After 5PM or if no answer call [phone]

## 2022-03-11 DIAGNOSIS — D500 Iron deficiency anemia secondary to blood loss (chronic): Secondary | ICD-10-CM | POA: Diagnosis not present

## 2022-03-11 DIAGNOSIS — E8809 Other disorders of plasma-protein metabolism, not elsewhere classified: Secondary | ICD-10-CM

## 2022-03-11 DIAGNOSIS — A09 Infectious gastroenteritis and colitis, unspecified: Secondary | ICD-10-CM | POA: Diagnosis not present

## 2022-03-11 DIAGNOSIS — B9620 Unspecified Escherichia coli [E. coli] as the cause of diseases classified elsewhere: Secondary | ICD-10-CM | POA: Diagnosis not present

## 2022-03-11 LAB — COMPREHENSIVE METABOLIC PANEL
ALT: 29 U/L (ref 0–44)
AST: 29 U/L (ref 15–41)
Albumin: 1.5 g/dL — ABNORMAL LOW (ref 3.5–5.0)
Alkaline Phosphatase: 77 U/L (ref 38–126)
Anion gap: 10 (ref 5–15)
BUN: 9 mg/dL (ref 8–23)
CO2: 26 mmol/L (ref 22–32)
Calcium: 8 mg/dL — ABNORMAL LOW (ref 8.9–10.3)
Chloride: 100 mmol/L (ref 98–111)
Creatinine, Ser: 0.83 mg/dL (ref 0.44–1.00)
GFR, Estimated: >60 mL/min (ref >60)
Glucose, Bld: 171 mg/dL — ABNORMAL HIGH (ref 70–99)
Potassium: 3.7 mmol/L (ref 3.5–5.1)
Sodium: 136 mmol/L (ref 135–145)
Total Bilirubin: 0.5 mg/dL (ref 0.3–1.2)
Total Protein: 4.5 g/dL — ABNORMAL LOW (ref 6.5–8.1)

## 2022-03-11 LAB — CBC
HCT: 23.7 % — ABNORMAL LOW (ref 36.0–46.0)
Hemoglobin: 7.7 g/dL — ABNORMAL LOW (ref 12.0–15.0)
MCH: 27.4 pg (ref 26.0–34.0)
MCHC: 32.5 g/dL (ref 30.0–36.0)
MCV: 84.3 fL (ref 80.0–100.0)
Platelets: 461 10*3/uL — ABNORMAL HIGH (ref 150–400)
RBC: 2.81 MIL/uL — ABNORMAL LOW (ref 3.87–5.11)
RDW: 16.1 % — ABNORMAL HIGH (ref 11.5–15.5)
WBC: 7.1 10*3/uL (ref 4.0–10.5)
nRBC: 0 % (ref 0.0–0.2)

## 2022-03-11 LAB — GLUCOSE, CAPILLARY
Glucose-Capillary: 147 mg/dL — ABNORMAL HIGH (ref 70–99)
Glucose-Capillary: 247 mg/dL — ABNORMAL HIGH (ref 70–99)
Glucose-Capillary: 145 mg/dL — ABNORMAL HIGH (ref 70–99)
Glucose-Capillary: 151 mg/dL — ABNORMAL HIGH (ref 70–99)

## 2022-03-11 MED ORDER — VENLAFAXINE HCL ER 37.5 MG PO CP24
37.5000 mg | ORAL_CAPSULE | Freq: Every day | ORAL | Status: DC
  Administered 2022-03-11 – 2022-03-22 (×12): 37.5 mg via ORAL
  Filled 2022-03-11 (×13): qty 1

## 2022-03-11 NOTE — Progress Notes (Addendum)
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  Overnight Events: NAEO.  Patient states she still has some abdominal pains today. They are relieved with bowel movements, and she feels it's caused by gas. She has been able to tolerate pudding, ice cream, grits, and protein shakes without emesis. She feels her "eyes were bigger than stomach" and she becomes full quickly. Still feels overall much better than she did on admission. Endorses watery bowel movements without blood. Last bloody bowel movement yesterday morning before endoscopy.   Objective:  Vital signs in last 24 hours: Vitals:   03/10/22 1555 03/10/22 2035 03/11/22 0525 03/11/22 0755  BP: 139/70 129/66 130/63 135/77  Pulse: (!) 107 (!) 110 (!) 110 (!) 110  Resp: 16   18  Temp: 98.2 F (36.8 C) 99.3 F (37.4 C) 98.7 F (37.1 C) 98.4 F (36.9 C)  TempSrc: Oral Oral Oral Oral  SpO2: 96% 98% 95% 99%  Weight:      Height:       Supplemental O2: Room Air SpO2: 99 %   Physical Exam:  Constitutional: well-appearing female sitting in chair, in no acute distress  HENT: normocephalic atraumatic, mucous membranes moist Eyes: conjunctiva non-erythematous Neck: supple Pulmonary/Chest: normal work of breathing on room air Neurological: alert & oriented x 3 Psych: Normal mood and affect  Filed Weights   03/08/22 1510  Weight: 77.1 kg    Intake/Output Summary (Last 24 hours) at 03/11/2022 1050 Last data filed at 03/11/2022 0500 Gross per 24 hour  Intake 240 ml  Output --  Net 240 ml   Net IO Since Admission: 458.7 mL [03/11/22 1050]  Pertinent Labs:    Latest Ref Rng & Units  03/11/2022    4:02 AM 03/10/2022    3:04 AM 03/09/2022    4:30 PM  CBC  WBC 4.0 - 10.5 K/uL 7.1  10.6  7.9   Hemoglobin 12.0 - 15.0 g/dL 7.7  7.5  7.9   Hematocrit 36.0 - 46.0 % 23.7  22.8  24.9   Platelets 150 - 400 K/uL 461  415  422        Latest Ref Rng & Units 03/11/2022    4:02 AM 03/10/2022    3:04 AM 03/09/2022    4:01 AM  CMP  Glucose 70 - 99 mg/dL 171  138  136   BUN 8 - 23 mg/dL '9  11  14   '$ Creatinine 0.44 - 1.00 mg/dL 0.83  0.95  1.00   Sodium 135 - 145 mmol/L 136  135  133   Potassium 3.5 - 5.1 mmol/L 3.7  3.8  3.2   Chloride 98 - 111 mmol/L 100  101  101   CO2 22 - 32 mmol/L '26  25  23   '$ Calcium 8.9 - 10.3 mg/dL 8.0  8.0  7.7   Total Protein 6.5 - 8.1 g/dL 4.5   4.1   Total Bilirubin 0.3 - 1.2 mg/dL 0.5   0.7   Alkaline Phos 38 - 126 U/L 77   61   AST 15 - 41 U/L 29   12   ALT 0 - 44 U/L 29   18    Imaging: No results found.  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 coli admitted 11/13 with worsening abdominal pain and swelling concerning for worsening colitis along with acute kidney injury.   #Colitis, suspect 2/2 enteropathogenic E. Coli #Normocytic anemia, stable #Hypoalbuminemia, improving GI panel redemonstrated EPEC. Lactoferrin positive. C diff panel negative. ID consulted, recommend azythromycin. GI performed flexible sigmoidoscopy 11/17. Path results / viral stains pending. Hgb 7.7 today, 7.5 yesterday. Mild, intermittent tachycardia. Leukocytosis resolved. Albumin 1.5. Taking protein shakes. Clinically improving but still advancing diet and monitoring for return of hematochezia.  - GI consulted, appreciate  recommendations  -f/u flex sig pathology results, viral stains  -soft diet, Ensure  -f/u ova + parasite - ID consulted, appreciate recommendations - azythromycin 500 mg daily x 3 days (last day 11/17) - Registered dietician consulted  -Ensure Enlive po BID  - transfuse if hgb <7 - daily CBC  #Acute kidney injury, resolved #Anion gap metabolic acidosis, resolved sCr 1.58 w/ GFR 35 on arrival in the setting of poor po intake and diarrhea, suspicious for pre-renal etiology. Renal ultrasound normal. Cr now 0.83, likely at baseline. - encourage po intake - Avoid nephrotoxins - daily BMP   #Type II diabetes mellitus A1c during last admission 7.2%. Fasting glucose at goal today. Will follow closely as diet advances. - Hold home metformin and mealtime insulin - Semglee 10 units QHS - SSI   #Paroxysmal atrial fibrillation not on anticoagulation #Hypertension Continues to be normotensive. Tachycardic. Will order EKG to assess rhythm. - hold home losartan - Continue home diltiazem and metoprolol - f/u EKG   #Hypovolemic hyponatremia #Hypokalemia Na 136. K 3.7.  -supplement potassium as needed -daily BMP   #Thrombocytosis PLT 461. Felt to be reactive due to infection.  -trend daily CBC.   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 Respiratory status stable, sating well on room air. Exam also re-assuring.  Ruthe Mannan twice daily   [facility]    DIET: CM IVF: LR DVT PPX: none BOWEL: na CODE: FULL FAM COM: Discussed with patient and her husband at [place] MD Internal Medicine Resident PGY-1 Please contact the on call pager after 5 pm and on weekends at [phone].

## 2022-03-11 NOTE — Care Management Important Message (Signed)
Important Message  Patient Details  Name: Meghan Alexander MRN: [id number] Date of Birth: [birth year]   Medicare Important Message Given:  Yes     [name] 03/11/2022, 9:54 AM

## 2022-03-11 NOTE — Progress Notes (Signed)
Meghan Alexander 9:27 AM  Subjective: Patient seen and examined and no problems from her sigmoidoscopy and is actually doing better and wants to advance her diet and she is not having any more bleeding and decreased diarrhea but a little more gas and a little decrease in her pain no new complaints  Objective: Vital signs stable afebrile no acute distress abdomen is soft nontender improved by my exam chemistries okay White count okay hemoglobin stable I discussed her case with pathology who thinks the initial stains look like acute colitis and not a chronic process and they are hoping the special viral stains will be out later today  Assessment: Colitis  Plan: Okay to advance diet to soft return to liquids as needed weight pathology as above and will ask rounding partner to check on tomorrow but please call us sooner if any GI question or problem  [facility] E  office [phone] After 5PM or if no answer call [phone]

## 2022-03-12 LAB — CBC
HCT: 22.6 % — ABNORMAL LOW (ref 36.0–46.0)
Hemoglobin: 7.5 g/dL — ABNORMAL LOW (ref 12.0–15.0)
MCH: 28 pg (ref 26.0–34.0)
MCHC: 33.2 g/dL (ref 30.0–36.0)
MCV: 84.3 fL (ref 80.0–100.0)
Platelets: 504 10*3/uL — ABNORMAL HIGH (ref 150–400)
RBC: 2.68 MIL/uL — ABNORMAL LOW (ref 3.87–5.11)
RDW: 16.1 % — ABNORMAL HIGH (ref 11.5–15.5)
WBC: 10.7 10*3/uL — ABNORMAL HIGH (ref 4.0–10.5)
nRBC: 0 % (ref 0.0–0.2)

## 2022-03-12 LAB — GLUCOSE, CAPILLARY
Glucose-Capillary: 125 mg/dL — ABNORMAL HIGH (ref 70–99)
Glucose-Capillary: 190 mg/dL — ABNORMAL HIGH (ref 70–99)
Glucose-Capillary: 148 mg/dL — ABNORMAL HIGH (ref 70–99)
Glucose-Capillary: 219 mg/dL — ABNORMAL HIGH (ref 70–99)
Glucose-Capillary: 247 mg/dL — ABNORMAL HIGH (ref 70–99)

## 2022-03-12 LAB — COMPREHENSIVE METABOLIC PANEL
ALT: 23 U/L (ref 0–44)
AST: 18 U/L (ref 15–41)
Albumin: 1.6 g/dL — ABNORMAL LOW (ref 3.5–5.0)
Alkaline Phosphatase: 70 U/L (ref 38–126)
Anion gap: 12 (ref 5–15)
BUN: 8 mg/dL (ref 8–23)
CO2: 25 mmol/L (ref 22–32)
Calcium: 8.2 mg/dL — ABNORMAL LOW (ref 8.9–10.3)
Chloride: 95 mmol/L — ABNORMAL LOW (ref 98–111)
Creatinine, Ser: 0.86 mg/dL (ref 0.44–1.00)
GFR, Estimated: >60 mL/min (ref >60)
Glucose, Bld: 139 mg/dL — ABNORMAL HIGH (ref 70–99)
Potassium: 4.1 mmol/L (ref 3.5–5.1)
Sodium: 132 mmol/L — ABNORMAL LOW (ref 135–145)
Total Bilirubin: 0.6 mg/dL (ref 0.3–1.2)
Total Protein: 4.4 g/dL — ABNORMAL LOW (ref 6.5–8.1)

## 2022-03-12 MED ORDER — ACETAMINOPHEN 325 MG PO TABS
650.0000 mg | ORAL_TABLET | Freq: Four times a day (QID) | ORAL | Status: DC | PRN
  Administered 2022-03-13 – 2022-03-20 (×4): 650 mg via ORAL
  Filled 2022-03-12 (×4): qty 2

## 2022-03-12 NOTE — Progress Notes (Signed)
Subjective: Ongoing diarrhea, about same as yesterday. Multiple episodes per day.  Objective: Vital signs in last 24 hours: Temp:  [98.5 F (36.9 C)-99.1 F (37.3 C)] 98.5 F (36.9 C) (11/18 1211) Pulse Rate:  [102-115] 102 (11/18 1211) Resp:  [18-20] 20 (11/18 1211) BP: (122-142)/(60-69) 125/69 (11/18 1211) SpO2:  [91 %-100 %] 97 % (11/18 1211) Weight change:  Last BM Date : 03/11/22  PE: GEN:  NAD ABD:  Mild protuberant, mild generalized diffuse tenderness  Lab Results: CBC    Component Value Date/Time   WBC 10.7 (H) 03/12/2022 0228   RBC 2.68 (L) 03/12/2022 0228   HGB 7.5 (L) 03/12/2022 0228   HGB 12.0 12/09/2021 1339   HCT 22.6 (L) 03/12/2022 0228   HCT 37.1 12/09/2021 1339   PLT 504 (H) 03/12/2022 0228   PLT 325 12/09/2021 1339   MCV 84.3 03/12/2022 0228   MCV 81 12/09/2021 1339   MCH 28.0 03/12/2022 0228   MCHC 33.2 03/12/2022 0228   RDW 16.1 (H) 03/12/2022 0228   RDW 16.7 (H) 12/09/2021 1339   LYMPHSABS 0.4 (L) 03/09/2022 0401   MONOABS 1.1 (H) 03/09/2022 0401   EOSABS 0.2 03/09/2022 0401   BASOSABS 0.0 03/09/2022 0401  CMP     Component Value Date/Time   NA 132 (L) 03/12/2022 0228   NA 142 12/09/2021 1339   K 4.1 03/12/2022 0228   CL 95 (L) 03/12/2022 0228   CO2 25 03/12/2022 0228   GLUCOSE 139 (H) 03/12/2022 0228   BUN 8 03/12/2022 0228   BUN 21 12/09/2021 1339   CREATININE 0.86 03/12/2022 0228   CREATININE 0.76 11/16/2021 1317   CALCIUM 8.2 (L) 03/12/2022 0228   PROT 4.4 (L) 03/12/2022 0228   ALBUMIN 1.6 (L) 03/12/2022 0228   AST 18 03/12/2022 0228   AST 15 11/16/2021 1317   ALT 23 03/12/2022 0228   ALT 16 11/16/2021 1317   ALKPHOS 70 03/12/2022 0228   BILITOT 0.6 03/12/2022 0228   BILITOT 0.4 11/16/2021 1317   GFRNONAA >60 03/12/2022 0228   GFRNONAA >60 11/16/2021 1317   GFRAA >60 12/05/2019 0853    Assessment:   Acute diarrhea.  Plan:   Supportive care. Advance diet slowly as tolerated. Tailored therapy hopefully once results  from colonic biopsy specimens (including viral stains) are available. [facility] will revisit once biopsy results available.   Landry Dyke 03/12/2022, 3:24 PM   Cell [phone] If no answer or after 5 PM call [phone]

## 2022-03-12 NOTE — Progress Notes (Addendum)
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   Overnight Events: None  Interim History: Meghan Alexander had an additional episode of hematochezia after our team rounded on her yesterday around 11:50 AM. She has since had multiple diarrheal BM without evidence of blood. She is otherwise doing well and endorses continued appetite however when food is in front of her she struggles to eat it. She has not yet ordered food for this morning but we discussed ordering bland items to begin with. She otherwise feels well.  OBJECTIVE:  Vital Signs: Vitals:   03/11/22 2135 03/12/22 0648 03/12/22 0815 03/12/22 1024  BP: 135/66 128/65 122/60 125/64  Pulse: (!) 105 (!) 110 (!) 110 (!) 115  Resp: '20 20 18   '$ Temp: 98.6 F (37 C) 98.5 F (36.9 C) 98.8 F (37.1 C)   TempSrc: Oral Oral Oral   SpO2: 95% 100% 96% 98%  Weight:      Height:       Supplemental O2: Room Air SpO2: 98 %  Filed Weights   03/08/22 1510  Weight: 77.1 kg    No intake or output data in the 24 hours ending 03/12/22 1042 Net IO Since Admission: 458.7 mL [03/12/22 1042]  Physical Exam: Constitutional:Well appearing, comfortably resting in bed. In no acute distress. Cardio:Tachycardic but regular rhythm. No murmurs, rubs, or gallops. Pulm:Clear to auscultation bilaterally. Normal work of breathing on room air. JOA:[id number] for extremity edema. Skin:Warm and dry. Neuro:Alert and oriented x3. No focal deficit noted. Psych:Pleasant mood and affect.  Patient Lines/Drains/Airways Status     Active Line/Drains/Airways     Name Placement date Placement time Site Days   Peripheral IV  03/07/22 20 G Right Antecubital 03/07/22  1938  Antecubital  5   Incision (Closed) 12/18/18 Back Mid;Lower 12/18/18  0902  -- 1180   Incision (Closed) 09/03/21 Ankle Right 09/03/21  1318  -- 190             ASSESSMENT/PLAN:  Assessment: Principal Problem:   Infectious colitis   Plan: #Colitis, suspect 2/2 enteropathogenic E. Coli #Normocytic anemia, stable #Hypoalbuminemia, improving Patient has now completed additional 3-day course of azithromycin for EPEC. Pathology results are pending from flexible sigmoidoscopy 11/16; unfortunately stool sample for ova/parasites has not yet been sent out however I did request that she make nursing aware when she needs to have an additional BM so this can be sent out, and made nursing aware of this request. Leukocytosis again to 10.7 from 7.1 11/17, which seems to be labile in general this hospital stay. Intermittent tachycardia persists. She has had an additional episode of hematochezia however has had multiple bowel movements since that were non-bloody. The stool in the photo she showed me did appear coated in blood and a pad from her underwear had some blood on it. She has known ulcers in the colon and undoubtedly has irritation from one month or longer of diarrhea. Unclear at this point if her symptoms are inflammatory related to underlying GI disease versus infectious, though given multiple rounds of antibiotic therapy I suspect active infection is less likely the major contributing etiology to her ongoing symptoms. - GI consulted, appreciate recommendations  -Will seek input regarding if patient needs to remain hospitalized for disease management versus  discharge with understanding that healing from infectious or inflammatory process will take some time             -F/u pathology results, viral stains, ova and parasites -S/p azithromycin x3 days - Registered dietician consulted, appreciate their recommendations             -Ensure Enlive PO BID   -Trend CBC; repeat if hemodynamically unstable; transfuse if Hgb <7   #Type II diabetes mellitus A1c during last admission 7.2%. Fasting glucose at goal today. Will follow closely as diet advances and adjust insulin regimen as needed. -Continue to hold home metformin -Continue semglee 10 units QHS, SSI TID with meals   #Paroxysmal atrial fibrillation not on anticoagulation #Hypertension Remains normotensive but tachycardic, consistently around 110.  -Continue holding home losartan -Continue home diltiazem and metoprolol - f/u EKG   #Hypovolemic hyponatremia Na acutely dropped to 132 from 136; Na 136. PO intake has been decreased due to prolonged lack of appetite, and now that appetite has returned, [place] is having difficulty with wanting to eat what is in front of her. Combined with continued diarrhea I suspect her hyponatremia is secondary to volume status and expect normalization as PO intake improves, which is a common goal for today after discussing intake with the patient. -Trend BMP -Consider adding IVF if PO intake remains poor   #Thrombocytosis Now 504, which is consistent with gradual increase since admission. Felt to be reactive due to infection/inflammation.  -Trend CBC   #History of follicular lymphoma s/p chemotherapy #Drug-induced pneumonitis, previously on steroids -Dulera BID  Best Practice: Diet: Soft IVF: None VTE: Place and maintain sequential compression device Start: 03/09/22 0858 Code: Full AB: None Family Contact: Fariborz, called and notified. DISPO: Anticipated discharge to Home pending  medical stability .  Signature: Farrel Gordon, D.O.  Internal Medicine Resident, PGY-2 [facility]  Pager: [phone]   Please contact the on call pager after 5 pm and on weekends at [phone].

## 2022-03-13 ENCOUNTER — Encounter (HOSPITAL_COMMUNITY): Payer: Self-pay | Admitting: Gastroenterology

## 2022-03-13 DIAGNOSIS — A09 Infectious gastroenteritis and colitis, unspecified: Secondary | ICD-10-CM | POA: Diagnosis not present

## 2022-03-13 DIAGNOSIS — D500 Iron deficiency anemia secondary to blood loss (chronic): Secondary | ICD-10-CM | POA: Diagnosis not present

## 2022-03-13 DIAGNOSIS — E8809 Other disorders of plasma-protein metabolism, not elsewhere classified: Secondary | ICD-10-CM | POA: Diagnosis not present

## 2022-03-13 DIAGNOSIS — B9620 Unspecified Escherichia coli [E. coli] as the cause of diseases classified elsewhere: Secondary | ICD-10-CM | POA: Diagnosis not present

## 2022-03-13 LAB — CBC
HCT: 21.1 % — ABNORMAL LOW (ref 36.0–46.0)
HCT: 21 % — ABNORMAL LOW (ref 36.0–46.0)
Hemoglobin: 7 g/dL — ABNORMAL LOW (ref 12.0–15.0)
Hemoglobin: 7.1 g/dL — ABNORMAL LOW (ref 12.0–15.0)
MCH: 27.9 pg (ref 26.0–34.0)
MCH: 28 pg (ref 26.0–34.0)
MCHC: 33.2 g/dL (ref 30.0–36.0)
MCHC: 33.8 g/dL (ref 30.0–36.0)
MCV: 84.1 fL (ref 80.0–100.0)
MCV: 82.7 fL (ref 80.0–100.0)
Platelets: 525 10*3/uL — ABNORMAL HIGH (ref 150–400)
Platelets: 575 10*3/uL — ABNORMAL HIGH (ref 150–400)
RBC: 2.51 MIL/uL — ABNORMAL LOW (ref 3.87–5.11)
RBC: 2.54 MIL/uL — ABNORMAL LOW (ref 3.87–5.11)
RDW: 15.8 % — ABNORMAL HIGH (ref 11.5–15.5)
RDW: 15.7 % — ABNORMAL HIGH (ref 11.5–15.5)
WBC: 8.1 10*3/uL (ref 4.0–10.5)
WBC: 9.1 10*3/uL (ref 4.0–10.5)
nRBC: 0 % (ref 0.0–0.2)
nRBC: 0 % (ref 0.0–0.2)

## 2022-03-13 LAB — BASIC METABOLIC PANEL
Anion gap: 11 (ref 5–15)
BUN: 9 mg/dL (ref 8–23)
CO2: 24 mmol/L (ref 22–32)
Calcium: 7.8 mg/dL — ABNORMAL LOW (ref 8.9–10.3)
Chloride: 99 mmol/L (ref 98–111)
Creatinine, Ser: 0.8 mg/dL (ref 0.44–1.00)
GFR, Estimated: >60 mL/min (ref >60)
Glucose, Bld: 149 mg/dL — ABNORMAL HIGH (ref 70–99)
Potassium: 3.3 mmol/L — ABNORMAL LOW (ref 3.5–5.1)
Sodium: 134 mmol/L — ABNORMAL LOW (ref 135–145)

## 2022-03-13 LAB — GLUCOSE, CAPILLARY
Glucose-Capillary: 158 mg/dL — ABNORMAL HIGH (ref 70–99)
Glucose-Capillary: 199 mg/dL — ABNORMAL HIGH (ref 70–99)
Glucose-Capillary: 194 mg/dL — ABNORMAL HIGH (ref 70–99)
Glucose-Capillary: 143 mg/dL — ABNORMAL HIGH (ref 70–99)

## 2022-03-13 NOTE — Progress Notes (Signed)
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   Overnight Events: None  Interim History: Meghan Alexander had not had any recurrence of hematochezia or melena but does endorse continued frequent bowel movements. At this time she seems to have more issues with urgency and minimal content when she does have these frequent bowel movements. She is interested in further expanding her diet today and really wants desert.  OBJECTIVE:  Vital Signs: Vitals:   03/12/22 1619 03/12/22 2106 03/13/22 0516 03/13/22 0820  BP: (!) 129/59 125/61 (!) 108/58 123/65  Pulse: 98 (!) 103 95 91  Resp: '18 18 16 18  '$ Temp: 97.7 F (36.5 C) 98.4 F (36.9 C) 97.9 F (36.6 C) (!) 97.5 F (36.4 C)  TempSrc: Oral Oral Oral Oral  SpO2: 100% 95% 98% 97%  Weight:      Height:       Supplemental O2: Room Air SpO2: 97 %  Filed Weights   03/08/22 1510  Weight: 77.1 kg    No intake or output data in the 24 hours ending 03/13/22 0939 Net IO Since Admission: 458.7 mL [03/13/22 0939]  Physical Exam: Constitutional:Well appearing, seated at bedside eating fruit. In no acute distress. Cardio:Regular rate and rhythm. No murmurs, rubs, or gallops. Pulm:Clear to auscultation bilaterally. Normal work of breathing on room air. Abdomen:Soft, nontender, nondistended. ZYS:AYTKZSWF for extremity edema. Skin:Warm and dry. Neuro:Alert and oriented x3. No focal deficit noted. Psych:Pleasant mood and affect.  Patient Lines/Drains/Airways Status     Active Line/Drains/Airways     Name Placement date Placement time Site Days   Peripheral IV 03/07/22 20 G Right Antecubital 03/07/22  1938   Antecubital  5   Incision (Closed) 12/18/18 Back Mid;Lower 12/18/18  0902  -- 1180   Incision (Closed) 09/03/21 Ankle Right 09/03/21  1318  -- 190             ASSESSMENT/PLAN:  Assessment: Principal Problem:   Infectious colitis   Plan: #Colitis, suspect 2/2 enteropathogenic E. Coli #Normocytic anemia, stable #Hypoalbuminemia, improving Pathology results are still pending from flexible sigmoidoscopy 11/16 as well as stool sample for ova/parasites which was sent yesterday, 11/18. Leukocytosis has resolved and she is hemodynamically stable. She denies recurrent hematochezia or melena, last episode 11/17. She reports significant stool urgency with very little fecal matter with each bowel movement. At this time it is still unclear what the source of her diarrhea is; work-up ongoing. Hgb did drop to 7.0 today. -GI consulted, appreciate recommendations  -Supportive therapy while waiting on results of work-up             -F/u pathology results, viral stains, ova and parasites -Registered dietician consulted, appreciate their recommendations             -Ensure Enlive PO BID  -Trend CBC; repeat if hemodynamically unstable; transfuse if Hgb <7   #Type II diabetes mellitus A1c during last admission 7.2%. Fasting glucose at goal today. Will follow closely as diet advances and adjust insulin regimen as needed. -Continue to hold home metformin -Continue semglee 10 units QHS, SSI TID with meals   #Paroxysmal atrial fibrillation not on anticoagulation #Hypertension Remains normotensive with rate control. -Continue holding home losartan -Continue home diltiazem and metoprolol   #Hypovolemic hyponatremia Na near  normal today, 134. Despite continued diarrhea she reports very little stool content with each bowel movement. She has been gradually increasing PO intake and I suspect this will fully normalize and remain as such with continued progression of her diet and less GI volume  losses. -Trend BMP -Increase diet to carb modified   #Thrombocytosis Now 525, which is consistent with gradual increase since admission. Felt to be reactive due to infection/inflammation given ongoing diarrhea. -Trend CBC   #History of follicular lymphoma s/p chemotherapy #Drug-induced pneumonitis, previously on steroids -Dulera BID  Best Practice: Diet: Regular IVF: None VTE: Place and maintain sequential compression device Start: 03/09/22 0858 Code: Full AB: None Family Contact: Fariborz, called and notified. DISPO: Anticipated discharge to Home pending  medical stability .  Signature: Farrel Gordon, D.O.  Internal Medicine Resident, PGY-2 [facility]  Pager: [phone]   Please contact the on call pager after 5 pm and on weekends at [phone].

## 2022-03-13 NOTE — Progress Notes (Signed)
Pathology results from colon biopsies not yet available.  Continue supportive care.  [facility] will revisit once biopsy results are available, which in turn will (hopefully) help [place] tailor therapy.

## 2022-03-14 DIAGNOSIS — A09 Infectious gastroenteritis and colitis, unspecified: Secondary | ICD-10-CM | POA: Diagnosis not present

## 2022-03-14 DIAGNOSIS — B9620 Unspecified Escherichia coli [E. coli] as the cause of diseases classified elsewhere: Secondary | ICD-10-CM | POA: Diagnosis not present

## 2022-03-14 DIAGNOSIS — D500 Iron deficiency anemia secondary to blood loss (chronic): Secondary | ICD-10-CM | POA: Diagnosis not present

## 2022-03-14 LAB — HEMOGLOBIN AND HEMATOCRIT, BLOOD
HCT: 31.1 % — ABNORMAL LOW (ref 36.0–46.0)
Hemoglobin: 10.3 g/dL — ABNORMAL LOW (ref 12.0–15.0)

## 2022-03-14 LAB — BASIC METABOLIC PANEL
Anion gap: 16 — ABNORMAL HIGH (ref 5–15)
BUN: 9 mg/dL (ref 8–23)
CO2: 22 mmol/L (ref 22–32)
Calcium: 8.1 mg/dL — ABNORMAL LOW (ref 8.9–10.3)
Chloride: 99 mmol/L (ref 98–111)
Creatinine, Ser: 0.78 mg/dL (ref 0.44–1.00)
GFR, Estimated: >60 mL/min (ref >60)
Glucose, Bld: 125 mg/dL — ABNORMAL HIGH (ref 70–99)
Potassium: 3.5 mmol/L (ref 3.5–5.1)
Sodium: 137 mmol/L (ref 135–145)

## 2022-03-14 LAB — CBC
HCT: 20.9 % — ABNORMAL LOW (ref 36.0–46.0)
Hemoglobin: 6.6 g/dL — CL (ref 12.0–15.0)
MCH: 26.9 pg (ref 26.0–34.0)
MCHC: 31.6 g/dL (ref 30.0–36.0)
MCV: 85.3 fL (ref 80.0–100.0)
Platelets: 616 10*3/uL — ABNORMAL HIGH (ref 150–400)
RBC: 2.45 MIL/uL — ABNORMAL LOW (ref 3.87–5.11)
RDW: 15.8 % — ABNORMAL HIGH (ref 11.5–15.5)
WBC: 11.9 10*3/uL — ABNORMAL HIGH (ref 4.0–10.5)
nRBC: 0 % (ref 0.0–0.2)

## 2022-03-14 LAB — PREPARE RBC (CROSSMATCH)

## 2022-03-14 LAB — GLUCOSE, CAPILLARY
Glucose-Capillary: 154 mg/dL — ABNORMAL HIGH (ref 70–99)
Glucose-Capillary: 152 mg/dL — ABNORMAL HIGH (ref 70–99)
Glucose-Capillary: 195 mg/dL — ABNORMAL HIGH (ref 70–99)

## 2022-03-14 LAB — PHOSPHORUS: Phosphorus: 4.1 mg/dL (ref 2.5–4.6)

## 2022-03-14 LAB — ABO/RH: ABO/RH(D): A POS

## 2022-03-14 LAB — MAGNESIUM: Magnesium: 1.7 mg/dL (ref 1.7–2.4)

## 2022-03-14 LAB — SURGICAL PATHOLOGY

## 2022-03-14 MED ORDER — GERHARDT'S BUTT CREAM
TOPICAL_CREAM | Freq: Two times a day (BID) | CUTANEOUS | Status: DC
  Filled 2022-03-14 (×2): qty 1

## 2022-03-14 MED ORDER — BANATROL TF EN LIQD
60.0000 mL | Freq: Two times a day (BID) | ENTERAL | Status: DC
  Administered 2022-03-14 – 2022-03-21 (×15): 60 mL via ORAL
  Filled 2022-03-14 (×15): qty 60

## 2022-03-14 MED ORDER — GLUCERNA SHAKE PO LIQD
237.0000 mL | Freq: Three times a day (TID) | ORAL | Status: DC
  Administered 2022-03-14 – 2022-03-22 (×23): 237 mL via ORAL

## 2022-03-14 MED ORDER — SODIUM CHLORIDE 0.9% IV SOLUTION: Freq: Once | INTRAVENOUS | Status: AC

## 2022-03-14 NOTE — Progress Notes (Signed)
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   Overnight Events: None  Interim History:  Patient continues to have about 20 episodes of non-bloody diarrhea. Describes it as brown and loose stools. Denies any hematochezia or melena. Patient did endorse feeling diaphoretic last night and took a dose of tylenol. No fevers this morning. She has been tolerating nutrition shakes and small amounts of food.   OBJECTIVE:  Vital Signs: Vitals:   03/14/22 0618 03/14/22 1100 03/14/22 1127 03/14/22 1200  BP: 120/67 131/74 130/75 125/67  Pulse: (!) 101 (!) 110 (!) 113 (!) 114  Resp: '18 18 18 18  '$ Temp: 99 F (37.2 C) 98.7 F (37.1 C) 98.7 F (37.1 C) 98.6 F (37 C)  TempSrc: Oral Oral Oral Oral  SpO2: 100% 99% 99%   Weight:      Height:       Supplemental O2: Room Air SpO2: 99 %  Filed Weights   03/08/22 1510  Weight: 77.1 kg     Intake/Output Summary (Last 24 hours) at 03/14/2022 1250 Last data filed at 03/13/2022 2000 Gross per 24 hour  Intake 577 ml  Output --  Net 577 ml   Net IO Since Admission: 1,335.7 mL [03/14/22 1250]  Physical Exam: Constitutional: Well appearing, laying in bed. In no acute distress. Cardio: tachycardic  Pulm: Normal work of breathing on room air. Abdomen: Soft, non-distended, tenderness to palpation along LLQ, no guarding or rebound MSK: Normal bulk and tone.  Skin: Warm and dry. Decreased skin turgor.  Neuro: Alert and oriented x3. No focal deficit noted. Psych: Pleasant mood and affect.  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   Plan: #Colitis, suspect 2/2 enteropathogenic E. Coli #Normocytic anemia #Hypoalbuminemia, improving GI path panel (+) for EPEC, received 3 day course of azithromycin per ID. Negative C diff. Flexible sigmoidoscopy showed external and internal hemorrhoids and multiple ulcerations in sigmoid and descending colon. Biopsy results showed acute colitis with erosion and ulceration and nonspecific changes but suggest mucosal ischemic injury. WBC increased today to 11.9. Patient does endorse episode of diaphoresis last night and took tylenol. Recorded temp max was 100.1. Continues to report 20 episodes of diarrhea per day. Hgb dropped to 6.6 this morning. Denies any signs of bleeding in stool or urine. Has not had any recurrent hematochezia or melena since last reported on 11/17.  -GI consulted, supportive therapy for now, appreciate recommendations -pending stool ova and parasites  -Ensure Enlive po BID, appreciate RD consult and  recommendations -2 units PRBC today -follow H&H -Gerhardt's butt cream for skin irritation due to persistent diarrhea  #Type II diabetes mellitus A1c during last admission 7.2%. She is tolerating some po intake. Will follow closely as diet improves and may need to adjust insulin regimen. PTA  she was on metformin.  -continue semglee 10 units QHS, SSI TID with meals   #Paroxysmal atrial fibrillation not on anticoagulation #Hypertension Patient remains normotensive. Holding home losartan given initial AKI.  -continue home diltiazem and metoprolol    #Hypovolemic hyponatremia Na has returned to normal, 137. Improving po intake and currently on carb modified diet.    #Thrombocytosis Increased to 616. Could be reactive to infection or inflammation given ongoing diarrhea.  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 Irene Alexander and last note states she has completed Bendamustine Rituxan chemotherapy followed by 18 months of Rituxan maintenance with last dose on 02/25/2021.    Best Practice: Diet: carb modified IVF: None VTE: Place and maintain sequential compression device Start: 03/09/22 0858 Code: Full AB: None DISPO: Anticipated discharge to Home pending  medical stability .  Signature: Angelique Blonder, DO Internal Medicine Resident, PGY-1 [facility]  Pager: [phone]   Please contact the on call pager after 5 pm and on weekends at [phone].

## 2022-03-14 NOTE — Progress Notes (Signed)
IV team to bedside per consult, pt with adequate IV access. New IV placed by floor RN.

## 2022-03-14 NOTE — Progress Notes (Addendum)
[facility] for Infectious Disease    Date of Admission:  03/07/2022   Total days of antibiotics3   ID: Meghan Alexander is a 66 y.o. female with follicular lymphoma SP chemotherapy on Rituxan(on hold x1 year) c/b drug induced ILD presents fro chronic diarrhea. Last admitted 11/6-11/8 for dirrhea, GIP+ EPEc treated with cipro+ metro x 5 days. She returns as symptoms have not improved underwent colonoscopy and also was treated with 3 days of azithromycin without improvement.  Principal Problem:   Infectious colitis    Subjective: Febrile to 101F last night per patient. Still having up to 10 bouts of water stools in 12 hrs.  explosive at times with some abd cramping at times but mostly distended. Had nightsweats when fever broke. No blood  Hgb down to 6.6 - receiving 2 u of rbc  Pic from felx sig on 11/16   Medications:   diltiazem  240 mg Oral Daily   feeding supplement (GLUCERNA SHAKE)  237 mL Oral TID BM   fiber supplement (BANATROL TF)  60 mL Oral BID   Gerhardt's butt cream   Topical BID   insulin aspart  0-15 Units Subcutaneous TID WC   insulin glargine-yfgn  10 Units Subcutaneous QHS   metoprolol succinate  25 mg Oral Daily   multivitamin with minerals  1 tablet Oral Daily   venlafaxine XR  37.5 mg Oral Q breakfast    Objective: Vital signs in last 24 hours: Temp:  [98.4 F (36.9 C)-100.1 F (37.8 C)] 98.6 F (37 C) (11/20 1506) Pulse Rate:  [101-114] 108 (11/20 1506) Resp:  [18-20] 18 (11/20 1506) BP: (120-135)/(64-75) 125/64 (11/20 1506) SpO2:  [97 %-100 %] 97 % (11/20 1442)  Physical Exam  Constitutional:  oriented to person, place, and time. appears well-developed and well-nourished. No distress.  HENT: Ruby/AT, PERRLA, no scleral icterus Mouth/Throat: Oropharynx is clear and moist. No oropharyngeal exudate.  Cardiovascular: Normal rate, regular rhythm and normal heart sounds. Exam reveals no gallop and no friction rub.  No murmur heard.  Pulmonary/Chest:  Effort normal and breath sounds normal. No respiratory distress.  has no wheezes.  Neck = supple, no nuchal rigidity Abdominal: Soft. Bowel sounds are normal.  mildly distension. There is no tenderness. Hypoactive sounds, Lymphadenopathy: no cervical adenopathy. No axillary adenopathy Neurological: alert and oriented to person, place, and time.  Skin: Skin is warm and dry. No rash noted. No erythema.  Psychiatric: a normal mood and affect.  behavior is normal.    Lab Results Recent Labs    03/13/22 0211 03/13/22 2039 03/14/22 0252  WBC 8.1 9.1 11.9*  HGB 7.0* 7.1* 6.6*  HCT 21.1* 21.0* 20.9*  NA 134*  --  137  K 3.3*  --  3.5  CL 99  --  99  CO2 24  --  22  BUN 9  --  9  CREATININE 0.80  --  0.78   Liver Panel Recent Labs    03/12/22 0228  PROT 4.4*  ALBUMIN 1.6*  AST 18  ALT 23  ALKPHOS 70  BILITOT 0.6   Lab Results  Component Value Date   CRP 30.2 (H) 03/01/2022   Lab Results  Component Value Date   ESRSEDRATE 74 (H) 03/01/2022     Microbiology:  Path: Sections show multiple fragments of colonic mucosa exhibiting a normal crypt architecture and focal surface ulceration/erosion.  Within these foci the lamina propria appears slightly fibrotic with a weathered appearing crypts and a patchy mixed mononuclear  cell infiltrate.  No hyalinized stroma is seen.  There is no increase in intraepithelial lymphocytes and the collagen table is of normal thickness.  No granulomas or parasites are seen.  No diagnostic viral changes are seen. There is no evidence of dysplasia or carcinoma.  Immunohistochemical stains for HSV-1 and HSV-2 are negative with adequate control.  An immunohistochemical stain for CMV is also negative with adequate control.  An immunohistochemical stain for adenovirus has been ordered and the results of this stain will be reported in a subsequent addendum to this report.  Overall these changes are nonspecific but suggest mucosal ischemic  injury.  Dr. Watt Climes was notified of the preliminary diagnosis on 03/11/2022 at 9:22 AM by Dr. Chauncey Cruel.  Studies/Results: No results found.  Lactoferrin + Assessment/Plan: EPEC usual duration is 12 days. And she has received treatment dose with azithromycin which should still be in her system technically. Path ruled out cmv, hsv, and lymphocytic diarrhea. Will check for work up for microscopic colitis.  - would check rheumatoid factor, ANA, antimitochondrial ab, anca,  - would discuss with GI if this is consistent with microscopic colitis, and if budesonide might be useful trial.   I have personally spent 50 minutes involved in face-to-face and non-face-to-face activities for this patient on the day of the visit. Professional time spent includes the following activities: Preparing to see the patient (review of tests), Obtaining and/or reviewing separately obtained history (admission/discharge record), Performing a medically appropriate examination and/or evaluation , Ordering medications/tests/procedures, referring and communicating with other health care professionals, Documenting clinical information in the EMR, Independently interpreting results (not separately reported), Communicating results to the patient/family/caregiver, Counseling and educating the patient/family/caregiver and Care coordination (not separately reported).    [facility] for Infectious Diseases Pager: [phone]  03/14/2022, 3:39 PM

## 2022-03-14 NOTE — Progress Notes (Signed)
   03/14/22 1127  Assess: MEWS Score  Temp 98.7 F (37.1 C)  BP 130/75  MAP (mmHg) 91  Pulse Rate (!) 113  Resp 18  Level of Consciousness Alert  SpO2 99 %  O2 Device Room Air  Assess: MEWS Score  MEWS Temp 0  MEWS Systolic 0  MEWS Pulse 2  MEWS RR 0  MEWS LOC 0  MEWS Score 2  MEWS Score Color Yellow  Assess: if the MEWS score is Yellow or Red  Were vital signs taken at a resting state? Yes  Focused Assessment No change from prior assessment  Does the patient meet 2 or more of the SIRS criteria? Yes  Does the patient have a confirmed or suspected source of infection? No  MEWS guidelines implemented *See Row Information* Yes  Take Vital Signs  Increase Vital Sign Frequency  Yellow: Q 2hr X 2 then Q 4hr X 2, if remains yellow, continue Q 4hrs  Escalate  MEWS: Escalate Yellow: discuss with charge nurse/RN and consider discussing with provider and RRT  Notify: Charge Nurse/RN  Name of Charge Nurse/RN Notified [place]  Date Charge Nurse/RN Notified 03/14/22  Time Charge Nurse/RN Notified 1247  Provider Notification  Provider Name/Title Dr Gwyndolyn Saxon  Date Provider Notified 03/14/22  Time Provider Notified 1248  Method of Notification Page  Document  Progress note created (see row info) Yes  Assess: SIRS CRITERIA  SIRS Temperature  0  SIRS Pulse 1  SIRS Respirations  0  SIRS WBC 1  SIRS Score Sum  2   Pt MEWS yellow, protocol initiated. MD notified

## 2022-03-14 NOTE — Progress Notes (Signed)
First unit of prbcs transfusing as ordered, no signs of reaction noted to reporting time

## 2022-03-14 NOTE — Progress Notes (Signed)
Nutrition Follow-up  DOCUMENTATION CODES:   Not applicable  INTERVENTION:  - Discontinue Ensure.   - Add Glucerna Shake po TID, each supplement provides 220 kcal and 10 grams of protein.   - Add Banatrol BID.   NUTRITION DIAGNOSIS:   Inadequate oral intake related to decreased appetite as evidenced by per patient/family report.  GOAL:   Patient will meet greater than or equal to 90% of their needs   MONITOR:   PO intake, Supplement acceptance, Diet advancement, Labs  REASON FOR ASSESSMENT:   Consult Assessment of nutrition requirement/status  ASSESSMENT:   66 y.o. female presented to the ED with abdominal pain, poor PO intake, nausea, and diarrhea. Pt recently admitted 11/7-11/8 for infectious colitis due to E. Coli. PMH includes T2DM, HTN, GERD, and follicular lymphoma s/p chemotherapy. Pt admitted with infectious colitis and AKI.  Meds include: sliding scale insulin, semglee (10 units), MVI. Labs reviewed.   The pt's diet has been advanced. Pt reports that she is not very hungry and does not have an appetite. She states that she really only has an appetite for sweet foods and or mashed potatoes. The pt reports that she has still been experiencing diarrhea. RD will add Banatrol BID. Pt reports that she does not like Ensure shakes but is open to trying Glucerna. RD expressed the importance of adequate nutrition. Will continue to monitor PO intakes.   Diet Order:   Diet Order             Diet Carb Modified Fluid consistency: Thin; Room service appropriate? Yes  Diet effective now                   EDUCATION NEEDS:   No education needs have been identified at this time  Skin:  Skin Assessment: Reviewed RN Assessment  Last BM:  03/14/22  Height:   Ht Readings from Last 1 Encounters:  03/08/22 '5\' 2"'$  (1.575 m)    Weight:   Wt Readings from Last 1 Encounters:  03/08/22 77.1 kg    Ideal Body Weight:  50 kg  BMI:  Body mass index is 31.09  kg/m.  Estimated Nutritional Needs:   Kcal:  1800-2000  Protein:  90-105 grams  Fluid:  >/= 1.8 L  Thalia Bloodgood, RD, LDN, CNSC.

## 2022-03-14 NOTE — Progress Notes (Signed)
[facility] Progress Note  Meghan Alexander 66 y.o. 1955-09-27  CC: Diarrhea   Subjective: Patient states she is continuing to have diarrhea.  Having about 20 episodes per day.  Same as previous days.  ROS : Review of Systems  Constitutional:  Negative for chills and fever.  Eyes:  Negative for blurred vision, double vision and photophobia.  Gastrointestinal:  Positive for diarrhea. Negative for abdominal pain, blood in stool, constipation, heartburn, melena, nausea and vomiting.      Objective: Vital signs in last 24 hours: Vitals:   03/13/22 2142 03/14/22 0618  BP: 129/64 120/67  Pulse: (!) 107 (!) 101  Resp: 20 18  Temp: 100.1 F (37.8 C) 99 F (37.2 C)  SpO2: 97% 100%    Physical Exam:  General:  Alert, cooperative, no distress, appears stated age  Head:  Normocephalic, without obvious abnormality, atraumatic  Eyes:  Anicteric sclera, EOM's intact  Lungs:   Clear to auscultation bilaterally, respirations unlabored  Heart:  Regular rate and rhythm, S1, S2 normal  Abdomen:   Soft, non-tender, bowel sounds active all four quadrants,  no masses,     Lab Results: Recent Labs    03/13/22 0211 03/14/22 0251 03/14/22 0252  NA 134*  --  137  K 3.3*  --  3.5  CL 99  --  99  CO2 24  --  22  GLUCOSE 149*  --  125*  BUN 9  --  9  CREATININE 0.80  --  0.78  CALCIUM 7.8*  --  8.1*  MG  --  1.7  --   PHOS  --  4.1  --    Recent Labs    03/12/22 0228  AST 18  ALT 23  ALKPHOS 70  BILITOT 0.6  PROT 4.4*  ALBUMIN 1.6*   Recent Labs    03/13/22 2039 03/14/22 0252  WBC 9.1 11.9*  HGB 7.1* 6.6*  HCT 21.0* 20.9*  MCV 82.7 85.3  PLT 575* 616*   No results for input(s): "LABPROT", "INR" in the last 72 hours.    Assessment Diarrhea -Flex sigmoidoscopy 03/10/2022: External and internal hemorrhoids, congested mucosa in rectum, rectosigmoid colon, and distal sigmoid colon.  Eroded, inflamed, ulcerated mucosa in proximal sigmoid colon, mid sigmoid colon,  mid descending colon, and distal descending colon.  Biopsies pending  Plan: Continued supportive care Continue diet as tolerated Continuing to await biopsy results.  We will then manage based on biopsy specimens. [facility] will follow  Garnette Scheuermann PA-C 03/14/2022, 9:30 AM  Contact #  [phone]

## 2022-03-15 DIAGNOSIS — A09 Infectious gastroenteritis and colitis, unspecified: Secondary | ICD-10-CM | POA: Diagnosis not present

## 2022-03-15 DIAGNOSIS — B9620 Unspecified Escherichia coli [E. coli] as the cause of diseases classified elsewhere: Secondary | ICD-10-CM | POA: Diagnosis not present

## 2022-03-15 DIAGNOSIS — D500 Iron deficiency anemia secondary to blood loss (chronic): Secondary | ICD-10-CM | POA: Diagnosis not present

## 2022-03-15 LAB — TYPE AND SCREEN
ABO/RH(D): A POS
Antibody Screen: NEGATIVE
Unit division: 0
Unit division: 0

## 2022-03-15 LAB — BPAM RBC
Blood Product Expiration Date: 202311232359
Blood Product Expiration Date: 202311252359
ISSUE DATE / TIME: 202311201134
ISSUE DATE / TIME: 202311201434
Unit Type and Rh: 600
Unit Type and Rh: 600

## 2022-03-15 LAB — BASIC METABOLIC PANEL
Anion gap: 12 (ref 5–15)
BUN: 11 mg/dL (ref 8–23)
CO2: 25 mmol/L (ref 22–32)
Calcium: 7.9 mg/dL — ABNORMAL LOW (ref 8.9–10.3)
Chloride: 98 mmol/L (ref 98–111)
Creatinine, Ser: 0.79 mg/dL (ref 0.44–1.00)
GFR, Estimated: >60 mL/min (ref >60)
Glucose, Bld: 146 mg/dL — ABNORMAL HIGH (ref 70–99)
Potassium: 3 mmol/L — ABNORMAL LOW (ref 3.5–5.1)
Sodium: 135 mmol/L (ref 135–145)

## 2022-03-15 LAB — CBC
HCT: 27.8 % — ABNORMAL LOW (ref 36.0–46.0)
Hemoglobin: 9.2 g/dL — ABNORMAL LOW (ref 12.0–15.0)
MCH: 27.3 pg (ref 26.0–34.0)
MCHC: 33.1 g/dL (ref 30.0–36.0)
MCV: 82.5 fL (ref 80.0–100.0)
Platelets: 629 10*3/uL — ABNORMAL HIGH (ref 150–400)
RBC: 3.37 MIL/uL — ABNORMAL LOW (ref 3.87–5.11)
RDW: 15.9 % — ABNORMAL HIGH (ref 11.5–15.5)
WBC: 9.1 10*3/uL (ref 4.0–10.5)
nRBC: 0 % (ref 0.0–0.2)

## 2022-03-15 LAB — GLUCOSE, CAPILLARY
Glucose-Capillary: 124 mg/dL — ABNORMAL HIGH (ref 70–99)
Glucose-Capillary: 151 mg/dL — ABNORMAL HIGH (ref 70–99)
Glucose-Capillary: 136 mg/dL — ABNORMAL HIGH (ref 70–99)
Glucose-Capillary: 188 mg/dL — ABNORMAL HIGH (ref 70–99)

## 2022-03-15 MED ORDER — POTASSIUM CHLORIDE CRYS ER 20 MEQ PO TBCR
40.0000 meq | EXTENDED_RELEASE_TABLET | Freq: Two times a day (BID) | ORAL | Status: DC
  Administered 2022-03-15: 40 meq via ORAL
  Filled 2022-03-15 (×2): qty 2

## 2022-03-15 MED ORDER — LOPERAMIDE HCL 2 MG PO CAPS
2.0000 mg | ORAL_CAPSULE | Freq: Two times a day (BID) | ORAL | Status: DC
  Administered 2022-03-15 – 2022-03-16 (×3): 2 mg via ORAL
  Filled 2022-03-15 (×3): qty 1

## 2022-03-15 NOTE — Progress Notes (Signed)
ID PROGRESS NOTE   Afebrile, WBC at 9.  66yo F with chronic diarrhea. -recommend to do work up for chronic diarrhea non-infectious causes - possibly this could be post infectious IBS like picture but have not necessarily seen it with blood loss - her GIP (including cdiff) have been negative. In addition, EPEC sometimes is a false positive on the GIP and reflects normal flora - will see if loperamide reduces her output, and need for ivf, potassium repletion - recommend to do work up that would include endocrine, auto-immune causes for chronic diarrhea - if no change in output with loperamide ( and with increasing doses) - then would consider empiric trial of steroids.  Meghan Alexander for Infectious Diseases [phone]

## 2022-03-15 NOTE — Care Management Important Message (Signed)
Important Message  Patient Details  Name: Meghan Alexander MRN: [id number] Date of Birth: 1955/05/04   Medicare Important Message Given:  Yes     [name] 03/15/2022, 11:43 AM

## 2022-03-15 NOTE — Progress Notes (Signed)
[facility] Progress Note  Meghan Alexander 66 y.o. 03/04/1956  CC:  Diarrhea   Subjective: Patient states diarrhea is same. Continuing to have 12-20 episodes per day. However, she states last night she saw a tablespoon amount of maroon blood in stool. Reports mild abdominal discomfort. Tolerating diet without difficulty. Denies nausea and vomiting.  ROS : Review of Systems  Constitutional:  Negative for chills, fever and weight loss.  Gastrointestinal:  Positive for abdominal pain, blood in stool and diarrhea. Negative for constipation, heartburn, melena, nausea and vomiting.      Objective: Vital signs in last 24 hours: Vitals:   03/15/22 0300 03/15/22 0805  BP: (!) 142/87 129/72  Pulse: 100 100  Resp: 17 17  Temp: 98.2 F (36.8 C) 98.1 F (36.7 C)  SpO2: 98% 96%    Physical Exam:  General:  Alert, cooperative, no distress, appears stated age  Head:  Normocephalic, without obvious abnormality, atraumatic  Eyes:  Anicteric sclera, EOM's intact  Lungs:   Clear to auscultation bilaterally, respirations unlabored  Heart:  Regular rate and rhythm, S1, S2 normal  Abdomen:   Soft, non-tender, bowel sounds active all four quadrants,  no masses,     Lab Results: Recent Labs    03/14/22 0251 03/14/22 0252 03/15/22 0259  NA  --  137 135  K  --  3.5 3.0*  CL  --  99 98  CO2  --  22 25  GLUCOSE  --  125* 146*  BUN  --  9 11  CREATININE  --  0.78 0.79  CALCIUM  --  8.1* 7.9*  MG 1.7  --   --   PHOS 4.1  --   --    No results for input(s): "AST", "ALT", "ALKPHOS", "BILITOT", "PROT", "ALBUMIN" in the last 72 hours. Recent Labs    03/14/22 0252 03/14/22 1936 03/15/22 0259  WBC 11.9*  --  9.1  HGB 6.6* 10.3* 9.2*  HCT 20.9* 31.1* 27.8*  MCV 85.3  --  82.5  PLT 616*  --  629*   No results for input(s): "LABPROT", "INR" in the last 72 hours.    Assessment Diarrhea -Flex sigmoidoscopy 03/10/2022: External and internal hemorrhoids, congested mucosa in  rectum, rectosigmoid colon, and distal sigmoid colon.  Eroded, inflamed, ulcerated mucosa in proximal sigmoid colon, mid sigmoid colon, mid descending colon, and distal descending colon.  Biopsies show non-specific possibly ischemic etiology - negative stool studies - previous history of EPEC treated with 3 days of azithromycin - improvement in leukocytosis to 9.1 - hgb 9.2 - BUN and Cr normal.   Plan: Biopsies showed nonspecific colitis. No features of microscopic colitis. No further management options from GI standpoint, recommend ID manage going further. Continue diet as tolerated.  [facility] will follow  Garnette Scheuermann PA-C 03/15/2022, 9:36 AM  Contact #  [phone]

## 2022-03-15 NOTE — Progress Notes (Addendum)
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   Overnight Events: Patient reports episode of dark red blood in stool.  Interim History:  Patient continues to have 18-20 episodes of watery diarrhea. She did note seeing about a tablespoon of dark red blood in stool last night. Still has abdominal discomfort. Has been tolerating diet.   OBJECTIVE:  Vital Signs: Vitals:   03/14/22 2000 03/14/22 2114 03/15/22 0300 03/15/22 0805  BP: 132/78 (!) 142/74 (!) 142/87 129/72  Pulse: 100 (!) 106 100 100  Resp: '16 16 17 17  '$ Temp: 98.2 F (36.8 C) 98 F (36.7 C) 98.2 F (36.8 C) 98.1 F (36.7 C)  TempSrc:  Oral  Oral  SpO2: 97% 98% 98% 96%  Weight:      Height:       Supplemental O2: Room Air SpO2: 96 %  Filed Weights   03/08/22 1510  Weight: 77.1 kg    No intake or output data in the 24 hours ending 03/15/22 1540 Net IO Since Admission: 1,645.7 mL [03/15/22 1540]  Physical Exam: Constitutional: well appearing, laying in bed, in no acute distress. Cardio: tachycardic, regular rhythm   Pulm: Normal work of breathing on room air. Abdomen: Soft, non-distended, mild tenderness diffusely, no guarding or rebound  MSK: Normal bulk and tone.  Skin: Warm and dry.   Neuro: Alert and oriented x3. No focal deficit noted. Psych: Pleasant mood and affect.  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 GI path panel (+) for EPEC since previous admission, received 3 day course of azithromycin at this admission per ID. Negative C diff. Flexible sigmoidoscopy showed external and internal hemorrhoids and multiple ulcerations in sigmoid and descending colon. Biopsy results showed nonspecific changes but possibly ischemic injury. Patient did report one episode of maroon blood in stool last night. No other bloody episodes since that one. GI does not believe this is microscopic colitis, plan for symptomatic treatment and consider steroids if no improvement. ID has recommended a autoimmune workup. -GI and ID consulted, appreciate both recommendations -start Imodium 2 mg BID,  -pending stool ova and parasites  -switched to Glucerna and started Banatrol, appreciate RD consult and recommendations -Gerhardt's butt cream for skin irritation due to persistent diarrhea  #Normocytic anemia S/p 2 units PRBC yesterday. Post H&H showed 10.3. Hgb at 9.2 this morning. Patient did report one episode of maroon blood in  stool last night but no further instances. No other signs of bleeding. -trend CBC  #Type II diabetes mellitus A1c during last admission 7.2%. She is tolerating some po intake. Will follow closely as diet improves and may need to adjust insulin regimen. PTA she was on metformin.   -continue semglee 10 units QHS, SSI TID with meals   #Paroxysmal atrial fibrillation not on anticoagulation #Hypertension Normotensive this morning. Holding home losartan given initial AKI.  -continue home diltiazem and metoprolol    #Hypovolemic hyponatremia Na has returned to normal. Improving po intake and currently on carb modified diet.    #Thrombocytosis Increased to 629. Likely reactive to infection or inflammation given ongoing diarrhea.  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 Irene Alexander and last note states she has completed Bendamustine Rituxan chemotherapy followed by 18 months of Rituxan maintenance with last dose on 02/25/2021.    Best Practice: Diet: carb modified IVF: None VTE: Place and maintain sequential compression device Start: 03/09/22 0858 Code: Full AB: None DISPO: Anticipated discharge to Home pending  medical stability .  Signature: Meghan Blonder, DO Internal Medicine Resident, PGY-1 [facility]  Pager: [phone]   Please contact the on call pager after 5 pm and on weekends at [phone].

## 2022-03-16 DIAGNOSIS — D500 Iron deficiency anemia secondary to blood loss (chronic): Secondary | ICD-10-CM | POA: Diagnosis not present

## 2022-03-16 DIAGNOSIS — B9620 Unspecified Escherichia coli [E. coli] as the cause of diseases classified elsewhere: Secondary | ICD-10-CM | POA: Diagnosis not present

## 2022-03-16 DIAGNOSIS — A09 Infectious gastroenteritis and colitis, unspecified: Secondary | ICD-10-CM | POA: Diagnosis not present

## 2022-03-16 LAB — BASIC METABOLIC PANEL
Anion gap: 13 (ref 5–15)
BUN: 6 mg/dL — ABNORMAL LOW (ref 8–23)
CO2: 26 mmol/L (ref 22–32)
Calcium: 7.9 mg/dL — ABNORMAL LOW (ref 8.9–10.3)
Chloride: 96 mmol/L — ABNORMAL LOW (ref 98–111)
Creatinine, Ser: 0.74 mg/dL (ref 0.44–1.00)
GFR, Estimated: >60 mL/min (ref >60)
Glucose, Bld: 135 mg/dL — ABNORMAL HIGH (ref 70–99)
Potassium: 3.3 mmol/L — ABNORMAL LOW (ref 3.5–5.1)
Sodium: 135 mmol/L (ref 135–145)

## 2022-03-16 LAB — OVA + PARASITE EXAM

## 2022-03-16 LAB — CBC
HCT: 27.4 % — ABNORMAL LOW (ref 36.0–46.0)
Hemoglobin: 9.2 g/dL — ABNORMAL LOW (ref 12.0–15.0)
MCH: 27.8 pg (ref 26.0–34.0)
MCHC: 33.6 g/dL (ref 30.0–36.0)
MCV: 82.8 fL (ref 80.0–100.0)
Platelets: 654 10*3/uL — ABNORMAL HIGH (ref 150–400)
RBC: 3.31 MIL/uL — ABNORMAL LOW (ref 3.87–5.11)
RDW: 15.5 % (ref 11.5–15.5)
WBC: 8.8 10*3/uL (ref 4.0–10.5)
nRBC: 0 % (ref 0.0–0.2)

## 2022-03-16 LAB — GLUCOSE, CAPILLARY
Glucose-Capillary: 127 mg/dL — ABNORMAL HIGH (ref 70–99)
Glucose-Capillary: 185 mg/dL — ABNORMAL HIGH (ref 70–99)
Glucose-Capillary: 213 mg/dL — ABNORMAL HIGH (ref 70–99)
Glucose-Capillary: 195 mg/dL — ABNORMAL HIGH (ref 70–99)

## 2022-03-16 LAB — PHOSPHORUS: Phosphorus: 3.2 mg/dL (ref 2.5–4.6)

## 2022-03-16 LAB — MAGNESIUM: Magnesium: 1.7 mg/dL (ref 1.7–2.4)

## 2022-03-16 LAB — O&P RESULT

## 2022-03-16 MED ORDER — POTASSIUM CHLORIDE 20 MEQ PO PACK
40.0000 meq | PACK | Freq: Two times a day (BID) | ORAL | Status: DC
  Administered 2022-03-16: 40 meq via ORAL
  Filled 2022-03-16: qty 2

## 2022-03-16 MED ORDER — MAGNESIUM SULFATE 2 GM/50ML IV SOLN
2.0000 g | Freq: Once | INTRAVENOUS | Status: AC
  Administered 2022-03-16: 2 g via INTRAVENOUS
  Filled 2022-03-16: qty 50

## 2022-03-16 MED ORDER — LOPERAMIDE HCL 2 MG PO CAPS
4.0000 mg | ORAL_CAPSULE | Freq: Three times a day (TID) | ORAL | Status: DC
  Administered 2022-03-16 (×2): 4 mg via ORAL
  Filled 2022-03-16 (×2): qty 2

## 2022-03-16 NOTE — Progress Notes (Signed)
    [facility]    Date of Admission:  03/07/2022     ID: Meghan Alexander is a 66 y.o. female with  chronic diarrhea Principal Problem:   Infectious colitis    Subjective: She reports some improvement but not significant improvement in her diarrheal output.  ROS: no fever, chills, but diarrhea and no nausea and vomiting. Still has some abdominal distention.   Medications:   diltiazem  240 mg Oral Daily   feeding supplement (GLUCERNA SHAKE)  237 mL Oral TID BM   fiber supplement (BANATROL TF)  60 mL Oral BID   Gerhardt's butt cream   Topical BID   insulin aspart  0-15 Units Subcutaneous TID WC   insulin glargine-yfgn  10 Units Subcutaneous QHS   loperamide  4 mg Oral TID   metoprolol succinate  25 mg Oral Daily   multivitamin with minerals  1 tablet Oral Daily   potassium chloride  40 mEq Oral BID   venlafaxine XR  37.5 mg Oral Q breakfast    Objective: Vital signs in last 24 hours: Temp:  [97.9 F (36.6 C)-100 F (37.8 C)] 98.6 F (37 C) (11/22 1617) Pulse Rate:  [98-122] 105 (11/22 1617) Resp:  [15-20] 17 (11/22 1617) BP: (130-141)/(54-78) 135/60 (11/22 1617) SpO2:  [95 %-100 %] 96 % (11/22 1617) Physical Exam  Constitutional:  oriented to person, place, and time. appears well-developed and well-nourished. No distress.  HENT: [place]/AT, PERRLA, no scleral icterus Mouth/Throat: Oropharynx is clear and moist. No oropharyngeal exudate.  Cardiovascular: Normal rate, regular rhythm and normal heart sounds. Exam reveals no gallop and no friction rub.  No murmur heard.  Pulmonary/Chest: Effort normal and breath sounds normal. No respiratory distress.  has no wheezes.  Neck = supple, no nuchal rigidity Abdominal: Soft. Bowel sounds are normal.  mildly distension. There is no tenderness.  Lymphadenopathy: no cervical adenopathy. No axillary adenopathy Neurological: alert and oriented to person, place, and time.  Skin: Skin is warm and dry. No rash  noted. No erythema.  Psychiatric: a normal mood and affect.  behavior is normal.    Lab Results Recent Labs    03/15/22 0259 03/16/22 0410  WBC 9.1 8.8  HGB 9.2* 9.2*  HCT 27.8* 27.4*  NA 135 135  K 3.0* 3.3*  CL 98 96*  CO2 25 26  BUN 11 6*  CREATININE 0.79 0.74    Microbiology: reviewed Studies/Results: No results found.   Assessment/Plan: Chronic diarrhea = recommend to increase dose of loperamide to near max total dose daily to see if it has significant impact on decreasing her output.  Anemia = hemoglobin appears stable. Not requiring further transfusion. Suspect not having further gi blood loss  Will defer to gi to whether she is having significant improvement vs. Need for other treatment options  Dr Johnny Bridge vu available for questions over the next 4 days.  [facility] for Infectious Diseases Pager: [phone]  03/16/2022, 5:39 PM

## 2022-03-16 NOTE — Progress Notes (Signed)
[facility] Progress Note  Meghan Alexander 66 y.o. 01-14-1956  CC:  Diarrhea   Subjective: Patient feels like her diarrhea is slightly getting better.  She states she has more energy this morning.  Denies abdominal pain.  Denies nausea/vomiting.  ROS : Review of Systems  Constitutional:  Negative for chills, fever and weight loss.  Gastrointestinal:  Positive for diarrhea. Negative for abdominal pain, blood in stool, constipation, heartburn, melena, nausea and vomiting.      Objective: Vital signs in last 24 hours: Vitals:   03/16/22 0635 03/16/22 0825  BP: 134/78 130/70  Pulse: (!) 113 98  Resp: 17 15  Temp: 100 F (37.8 C) 97.9 F (36.6 C)  SpO2:  100%    Physical Exam:  General:  Alert, cooperative, no distress, appears stated age  Head:  Normocephalic, without obvious abnormality, atraumatic  Eyes:  Anicteric sclera, EOM's intact  Lungs:   Clear to auscultation bilaterally, respirations unlabored  Heart:  Regular rate and rhythm, S1, S2 normal  Abdomen:   Soft, non-tender, bowel sounds active all four quadrants,  no masses,     Lab Results: Recent Labs    03/14/22 0251 03/14/22 0252 03/15/22 0259 03/16/22 0407 03/16/22 0410  NA  --    < > 135  --  135  K  --    < > 3.0*  --  3.3*  CL  --    < > 98  --  96*  CO2  --    < > 25  --  26  GLUCOSE  --    < > 146*  --  135*  BUN  --    < > 11  --  6*  CREATININE  --    < > 0.79  --  0.74  CALCIUM  --    < > 7.9*  --  7.9*  MG 1.7  --   --  1.7  --   PHOS 4.1  --   --  3.2  --    < > = values in this interval not displayed.   No results for input(s): "AST", "ALT", "ALKPHOS", "BILITOT", "PROT", "ALBUMIN" in the last 72 hours. Recent Labs    03/15/22 0259 03/16/22 0410  WBC 9.1 8.8  HGB 9.2* 9.2*  HCT 27.8* 27.4*  MCV 82.5 82.8  PLT 629* 654*   No results for input(s): "LABPROT", "INR" in the last 72 hours.    Assessment Diarrhea -Flex sigmoidoscopy 03/10/2022: External and internal  hemorrhoids, congested mucosa in rectum, rectosigmoid colon, and distal sigmoid colon.  Eroded, inflamed, ulcerated mucosa in proximal sigmoid colon, mid sigmoid colon, mid descending colon, and distal descending colon.  Biopsies show non-specific possibly ischemic etiology - negative stool studies - previous history of EPEC treated with 3 days of azithromycin - improvement in leukocytosis to 9.1 - hgb 9.2 - BUN and Cr normal.     Plan: Diarrhea slightly improving on loperamide.  Continue loperamide and supportive care [facility] will follow  Garnette Scheuermann PA-C 03/16/2022, 8:42 AM  Contact #  [phone]

## 2022-03-16 NOTE — Progress Notes (Signed)
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   Overnight Events: none  Interim History:  Patient reports mild improvement compared to yesterday. She reports less episodes of diarrhea but can at times be about 1-2 an hour. Still having some diffuse abdominal pain. She has been drinking the Glucerna and staying hydrated.   OBJECTIVE:  Vital Signs: Vitals:   03/15/22 2045 03/15/22 2047 03/16/22 0450 03/16/22 0635  BP: (!) 141/68 134/76 (!) 134/54 134/78  Pulse: (!) 122 99 (!) 112 (!) 113  Resp: '20 15 16 17  '$ Temp: 98.6 F (37 C) 100 F (37.8 C) 98.9 F (37.2 C) 100 F (37.8 C)  TempSrc: Oral Oral Oral Oral  SpO2: 97% 95% 96%   Weight:      Height:       Supplemental O2: Room Air SpO2: 96 %  Filed Weights   03/08/22 1510  Weight: 77.1 kg     Intake/Output Summary (Last 24 hours) at 03/16/2022 2836 Last data filed at 03/16/2022 0400 Gross per 24 hour  Intake 150 ml  Output --  Net 150 ml   Net IO Since Admission: 1,795.7 mL [03/16/22 0718]  Physical Exam: Constitutional: well appearing, laying in bed, in no acute distress. Pulm: Normal work of breathing on room air. Abdomen: Soft, non-distended, diffuse tenderness with guarding at times, no rebound  MSK: Normal bulk and tone.  Skin: Warm and dry.   Neuro: Alert and oriented x3. Psych: Pleasant mood and affect.  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 GI path panel (+) for EPEC since previous admission, completed outpatient Cipro and received 3 day course of azithromycin at this admission per ID. Negative C diff. Flexible sigmoidoscopy showed external and internal hemorrhoids and multiple ulcerations in sigmoid and descending colon. Biopsy results showed nonspecific changes but possibly ischemic injury. Patient did report one episode of maroon blood in stool last night. No other bloody episodes since that one. GI revisited and plan to possibly repeating imaging and flex sigmoid. ID recommended autoimmune causes and non-infectious workup for chronic diarrhea. Prior autoimmune labs show negative ANA, ANCA, anti-dsDNA, anti-SSB/SSA and anti-scleroderma.  -GI and ID consulted, appreciate both recommendations -increase Imodium 4 mg TID  -pending stool ova and parasites  -continue Glucerna and Banatrol, appreciate RD consult and recommendations -Gerhardt's butt cream for skin irritation due to persistent diarrhea  #Normocytic anemia S/p 2  units PRBC. Hgb remains stable. Patient did report one episode of maroon blood in stool on 11/20. No other signs of bleeding. -will repeat CBC to see if Hgb remains stable  #Type II diabetes mellitus A1c during last admission 7.2%. She is tolerating some po  intake. Will follow closely as diet improves and may need to adjust insulin regimen. PTA she was on metformin.  -continue semglee 10 units QHS, SSI TID with meals   #Paroxysmal atrial fibrillation not on anticoagulation #Hypertension Normotensive this morning. Holding home losartan given initial AKI. Last EKG showed sinus tachycardia.  -orthostatic vitals due to frequent diarrhea and slowly improving po intake -continue home diltiazem and metoprolol    #Hypokalemia #Hypovolemic hyponatremia, resolved Na has returned to normal. Improving po intake slowly. Patient only took 1 dose of 40 mEq potassium yesterday and could not continue due to pill size. Will offer potassium packet to help. Magnesium was 1.7, will replete as well.  -replete potassium and magnesium -repeat BMP   #Thrombocytosis Increased to 629. Likely reactive to infection or inflammation given ongoing diarrhea.  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 Irene Limbo and last note states she has completed Bendamustine Rituxan chemotherapy followed by 18 months of Rituxan maintenance with last dose on 02/25/2021.    Best Practice: Diet: carb modified IVF: None VTE: Place and maintain sequential compression device Start: 03/09/22 0858 Code: Full AB: None DISPO: Anticipated discharge to Home pending  medical stability .  Signature: Angelique Blonder, DO Internal Medicine Resident, PGY-1 [facility]  Pager: [phone]   Please contact the on call pager after 5 pm and on weekends at [phone].

## 2022-03-17 DIAGNOSIS — A09 Infectious gastroenteritis and colitis, unspecified: Secondary | ICD-10-CM | POA: Diagnosis not present

## 2022-03-17 DIAGNOSIS — B9620 Unspecified Escherichia coli [E. coli] as the cause of diseases classified elsewhere: Secondary | ICD-10-CM | POA: Diagnosis not present

## 2022-03-17 LAB — GLUCOSE, CAPILLARY
Glucose-Capillary: 152 mg/dL — ABNORMAL HIGH (ref 70–99)
Glucose-Capillary: 126 mg/dL — ABNORMAL HIGH (ref 70–99)
Glucose-Capillary: 231 mg/dL — ABNORMAL HIGH (ref 70–99)
Glucose-Capillary: 120 mg/dL — ABNORMAL HIGH (ref 70–99)

## 2022-03-17 LAB — CBC
HCT: 28.7 % — ABNORMAL LOW (ref 36.0–46.0)
Hemoglobin: 9.2 g/dL — ABNORMAL LOW (ref 12.0–15.0)
MCH: 27.3 pg (ref 26.0–34.0)
MCHC: 32.1 g/dL (ref 30.0–36.0)
MCV: 85.2 fL (ref 80.0–100.0)
Platelets: 694 10*3/uL — ABNORMAL HIGH (ref 150–400)
RBC: 3.37 MIL/uL — ABNORMAL LOW (ref 3.87–5.11)
RDW: 15.4 % (ref 11.5–15.5)
WBC: 9 10*3/uL (ref 4.0–10.5)
nRBC: 0 % (ref 0.0–0.2)

## 2022-03-17 LAB — BASIC METABOLIC PANEL
Anion gap: 13 (ref 5–15)
BUN: 7 mg/dL — ABNORMAL LOW (ref 8–23)
CO2: 26 mmol/L (ref 22–32)
Calcium: 8.1 mg/dL — ABNORMAL LOW (ref 8.9–10.3)
Chloride: 92 mmol/L — ABNORMAL LOW (ref 98–111)
Creatinine, Ser: 0.91 mg/dL (ref 0.44–1.00)
GFR, Estimated: >60 mL/min (ref >60)
Glucose, Bld: 169 mg/dL — ABNORMAL HIGH (ref 70–99)
Potassium: 3.6 mmol/L (ref 3.5–5.1)
Sodium: 131 mmol/L — ABNORMAL LOW (ref 135–145)

## 2022-03-17 MED ORDER — LOPERAMIDE HCL 2 MG PO CAPS
4.0000 mg | ORAL_CAPSULE | Freq: Four times a day (QID) | ORAL | Status: DC
  Administered 2022-03-17 – 2022-03-22 (×22): 4 mg via ORAL
  Filled 2022-03-17 (×22): qty 2

## 2022-03-17 MED ORDER — SODIUM CHLORIDE 0.9 % IV SOLN
2.0000 g | INTRAVENOUS | Status: AC
  Administered 2022-03-17 – 2022-03-21 (×5): 2 g via INTRAVENOUS
  Filled 2022-03-17 (×5): qty 20

## 2022-03-17 MED ORDER — POTASSIUM CHLORIDE 20 MEQ PO PACK
40.0000 meq | PACK | Freq: Once | ORAL | Status: AC
  Administered 2022-03-17: 40 meq via ORAL
  Filled 2022-03-17: qty 2

## 2022-03-17 NOTE — Plan of Care (Signed)
Id brief note   Discussed with medicine team  We are at a juncture where there is a diagnostic fork/uncertainty with regard to her diarrhea  Clinically I would call this chronic diarrhea  I am aware of the "acute inflammatory cells" found on biopsy but this is more of a red herring with regard to her clinical course. Furthermore she has had adequate trial of "acute bacterial diarrhea" tx x2 most recently with azithromycin around the time of biopsy  In terms of infectious etiology for diarrhea. The gi pcr is extremely sensitive and has great negative predictive value -- the epec was found and again treated and shouldn't account for chronic diarrhea (no improvement with azith).   There is no clinical change with azith for either epec or other acute bacterial process that this should also cover.   In the setting of clinically chronic diarrhea, no change with what appears to be appropriate empiric abx, and gi pcr with only epec, she should start to have a broadened ddx for causes of chronic diarrhea focusing on nonfectious etiologies   If the team wants to trial another course of abx, there is no substantial risk and I think that is reasonable if they want to try. Azith 3 days or ceftriaxone 5 days.

## 2022-03-17 NOTE — Progress Notes (Signed)
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   Overnight Events: none  Interim History:  Patient states no significant change from yesterday. She has about 1 episode of diarrhea every 2 hours. She describes diarrhea and gassy and explosive with non-formed stool. Denies any blood in stool. Imodium has not provided significant relief.   OBJECTIVE:  Vital Signs: Vitals:   03/16/22 1617 03/16/22 1619 03/17/22 0500 03/17/22 0818  BP: 135/60 132/69 133/62 139/70  Pulse: (!) 105 (!) 108 (!) 108 (!) 103  Resp: '17 16 16 18  '$ Temp: 98.6 F (37 C) 98.1 F (36.7 C) 99.2 F (37.3 C) 97.8 F (36.6 C)  TempSrc: Oral Oral    SpO2: 96% 100% 100% 98%  Weight:      Height:       Supplemental O2: Room Air SpO2: 98 %  Filed Weights   03/08/22 1510  Weight: 77.1 kg     Intake/Output Summary (Last 24 hours) at 03/17/2022 1248 Last data filed at 03/16/2022 1610 Gross per 24 hour  Intake 50 ml  Output --  Net 50 ml   Net IO Since Admission: 1,845.7 mL [03/17/22 1248]  Physical Exam: Constitutional: alert and comfortably laying in bed, in no acute distress. Pulm: normal work of breathing on room air Abdomen: Soft, non-distended, less diffuse tenderness to palpation, no guarding or rebound  MSK: Normal bulk and tone.  Skin: Warm and dry.   Neuro: Alert and oriented x3. 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 GI path panel (+) for EPEC since previous admission, completed outpatient Cipro and received 3 day course of azithromycin at this admission per ID. Negative C diff. Flex sigmoidoscopy showed hemorrhoids and multiple ulcerations in sigmoid and descending colon. Biopsy results showed nonspecific changes but possibly ischemic injury. Previous workup include negative TTG IgA, stool ova and parasites, normal pancreatic elastase and TSH. Autoimmune workup earlier this year was negative. GI notes possibly repeating CT and flex sigmoidoscopy. ID recommends workup for chronic diarrhea of non-infectious etiologies. We will do another trial of antibiotics and reassess if any changes.   -GI and ID consulted, appreciate both recommendations -increase Imodium 4 mg QID -start rocephin 2g (day 1/5)  -continue Glucerna and Banatrol, appreciate RD consult and recommendations -Gerhardt's butt cream for skin irritation due to persistent diarrhea  #Normocytic anemia S/p 2 units PRBC.  Hgb has remained stable. No signs of bleeding.  #Type II diabetes mellitus A1c during last admission 7.2%. She is tolerating some po intake. Will follow closely as diet improves and may need to adjust insulin regimen. PTA she was on metformin.  -continue semglee  10 units QHS and SSI ACHS   #Paroxysmal atrial fibrillation not on anticoagulation #Hypertension Last EKG showed sinus tachycardia. She remains normotensive. Orthostatic vitals were normal. Holding home losartan given initial AKI.  -continue home diltiazem and metoprolol    #Hypokalemia #Hypovolemic hyponatremia, resolved Na dropped to 131 today. She is eating a few spoonfuls of food throughout the day. Slowly improving po intake. Discussed electrolyte drinks and she has Propel here. Replenished potassium and magnesium yesterday.  -repeat BMP   #Thrombocytosis Platelets remain elevated. Likely reactive to infection or inflammation given ongoing diarrhea and episodes of bloody stools.   #Depression Patient has depressed mood given no significant improvement in her chronic diarrhea. Offered support and discussed next steps in addressing her chronic diarrhea. She is continuing with her home venlafaxine.  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 Irene Limbo and last note states she has completed Bendamustine Rituxan chemotherapy followed by 18 months of Rituxan maintenance with last dose on 02/25/2021.    Best Practice: Diet: carb modified IVF: None VTE: Place and maintain sequential compression device Start: 03/09/22 0858 Code: Full DISPO: Anticipated discharge to Home pending  medical stability .  Signature: Angelique Blonder, DO Internal Medicine Resident, PGY-1 [facility]  Pager: [phone]   Please contact the on call pager after 5 pm and on weekends at [phone].

## 2022-03-18 DIAGNOSIS — A09 Infectious gastroenteritis and colitis, unspecified: Secondary | ICD-10-CM | POA: Diagnosis not present

## 2022-03-18 DIAGNOSIS — B9620 Unspecified Escherichia coli [E. coli] as the cause of diseases classified elsewhere: Secondary | ICD-10-CM | POA: Diagnosis not present

## 2022-03-18 LAB — BASIC METABOLIC PANEL
Anion gap: 11 (ref 5–15)
BUN: 6 mg/dL — ABNORMAL LOW (ref 8–23)
CO2: 27 mmol/L (ref 22–32)
Calcium: 8.3 mg/dL — ABNORMAL LOW (ref 8.9–10.3)
Chloride: 95 mmol/L — ABNORMAL LOW (ref 98–111)
Creatinine, Ser: 0.85 mg/dL (ref 0.44–1.00)
GFR, Estimated: >60 mL/min (ref >60)
Glucose, Bld: 218 mg/dL — ABNORMAL HIGH (ref 70–99)
Potassium: 3.6 mmol/L (ref 3.5–5.1)
Sodium: 133 mmol/L — ABNORMAL LOW (ref 135–145)

## 2022-03-18 LAB — GLUCOSE, CAPILLARY
Glucose-Capillary: 137 mg/dL — ABNORMAL HIGH (ref 70–99)
Glucose-Capillary: 206 mg/dL — ABNORMAL HIGH (ref 70–99)
Glucose-Capillary: 186 mg/dL — ABNORMAL HIGH (ref 70–99)
Glucose-Capillary: 180 mg/dL — ABNORMAL HIGH (ref 70–99)

## 2022-03-18 MED ORDER — BUDESONIDE 3 MG PO CPEP
9.0000 mg | ORAL_CAPSULE | Freq: Every day | ORAL | Status: DC
  Administered 2022-03-18 – 2022-03-22 (×5): 9 mg via ORAL
  Filled 2022-03-18 (×6): qty 3

## 2022-03-18 MED ORDER — MOMETASONE FURO-FORMOTEROL FUM 200-5 MCG/ACT IN AERO
2.0000 | INHALATION_SPRAY | Freq: Two times a day (BID) | RESPIRATORY_TRACT | Status: DC
  Administered 2022-03-20 – 2022-03-22 (×5): 2 via RESPIRATORY_TRACT
  Filled 2022-03-18: qty 8.8

## 2022-03-18 MED ORDER — ENOXAPARIN SODIUM 40 MG/0.4ML IJ SOSY: 40.0000 mg | PREFILLED_SYRINGE | INTRAMUSCULAR | Status: DC

## 2022-03-18 MED ORDER — HEPARIN SODIUM (PORCINE) 5000 UNIT/ML IJ SOLN
5000.0000 [IU] | Freq: Three times a day (TID) | INTRAMUSCULAR | Status: DC
  Administered 2022-03-18 – 2022-03-22 (×12): 5000 [IU] via SUBCUTANEOUS
  Filled 2022-03-18 (×12): qty 1

## 2022-03-18 NOTE — Progress Notes (Signed)
Subjective:  No abdominal pain. Ongoing diarrhea 10+ per day.  Objective: Vital signs in last 24 hours: Temp:  [97.8 F (36.6 C)-99.5 F (37.5 C)] 98.1 F (36.7 C) (11/24 0920) Pulse Rate:  [88-115] 100 (11/24 0920) Resp:  [16-20] 17 (11/24 0920) BP: (113-134)/(63-70) 133/70 (11/24 0920) SpO2:  [95 %-100 %] 97 % (11/24 0920) Weight change:  Last BM Date : 03/17/22  PE: GEN:  NAD HEENT:  Advance/AT, anicteric NEURO:  A/O, no encephalopathy PSYCH:  Normal mood/affect  Lab Results: CBC    Component Value Date/Time   WBC 9.0 03/17/2022 0149   RBC 3.37 (L) 03/17/2022 0149   HGB 9.2 (L) 03/17/2022 0149   HGB 12.0 12/09/2021 1339   HCT 28.7 (L) 03/17/2022 0149   HCT 37.1 12/09/2021 1339   PLT 694 (H) 03/17/2022 0149   PLT 325 12/09/2021 1339   MCV 85.2 03/17/2022 0149   MCV 81 12/09/2021 1339   MCH 27.3 03/17/2022 0149   MCHC 32.1 03/17/2022 0149   RDW 15.4 03/17/2022 0149   RDW 16.7 (H) 12/09/2021 1339   LYMPHSABS 0.4 (L) 03/09/2022 0401   MONOABS 1.1 (H) 03/09/2022 0401   EOSABS 0.2 03/09/2022 0401   BASOSABS 0.0 03/09/2022 0401  CMP     Component Value Date/Time   NA 133 (L) 03/18/2022 0236   NA 142 12/09/2021 1339   K 3.6 03/18/2022 0236   CL 95 (L) 03/18/2022 0236   CO2 27 03/18/2022 0236   GLUCOSE 218 (H) 03/18/2022 0236   BUN 6 (L) 03/18/2022 0236   BUN 21 12/09/2021 1339   CREATININE 0.85 03/18/2022 0236   CREATININE 0.76 11/16/2021 1317   CALCIUM 8.3 (L) 03/18/2022 0236   PROT 4.4 (L) 03/12/2022 0228   ALBUMIN 1.6 (L) 03/12/2022 0228   AST 18 03/12/2022 0228   AST 15 11/16/2021 1317   ALT 23 03/12/2022 0228   ALT 16 11/16/2021 1317   ALKPHOS 70 03/12/2022 0228   BILITOT 0.6 03/12/2022 0228   BILITOT 0.4 11/16/2021 1317   GFRNONAA >60 03/18/2022 0236   GFRNONAA >60 11/16/2021 1317   GFRAA >60 12/05/2019 0853    Assessment:   Acute diarrhea.  Plan:   This case has baffled both [place] and ID.  She has history of EPEC, but per ID has received  adequate therapy.  She has not made the customary improvement I would expect to see in both infectious or ischemic-type colitis situations even with loperamide administration.  Atypical viral processes essentially excluded per stains on recent colonic biopsies. As potential inflammatory process not treated, I will administer course of budesonide 9 mg po qd.   If, after a couple days, diarrhea has still not improved, would repeat sigmoidoscopy with biopsies.  This could be potentially important for a couple reasons:  if inflammatory process significantly better, yet diarrhea remains unchanged, then possibly there is another non-colonic source of diarrhea; conversely, if ongoing inflammatory process we could repeat biopsies and discuss further with pathology to see if stains for other processes should be done, and/or whether previously done stains should be repeated. I have reviewed this plan with patient and husband at bedside, and they are in agreement. Continue loperamide and diet as tolerated. [facility] will follow.   Meghan Alexander 03/18/2022, 1:33 PM   Cell [phone] If no answer or after 5 PM call [phone]

## 2022-03-18 NOTE — Progress Notes (Signed)
Mobility Specialist Progress Note:   03/18/22 1140  Mobility  Activity Ambulated independently in hallway  Level of Assistance Independent  Assistive Device Four wheel walker  Distance Ambulated (ft) 300 ft  Activity Response Tolerated well  Mobility Referral Yes  $Mobility charge 1 Mobility   Pt received in bed and agreeable. Took 1x extended seated break d/t fatigue. Pt left in bathroom with all needs met and call bell in reach.   Andrey Campanile Mobility Specialist Please contact via SecureChat or  Rehab office at [phone]

## 2022-03-18 NOTE — Progress Notes (Signed)
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   Overnight Events: none  Interim History:  Patient reports less gas and explosive diarrhea but still watery and about every 2 hours. States increased imodium has not helped much in decreasing the frequency. No further bloody stools.   OBJECTIVE:  Vital Signs: Vitals:   03/17/22 2200 03/18/22 0000 03/18/22 0200 03/18/22 0530  BP: 133/68 119/70 120/66 113/63  Pulse: (!) 115 (!) 114 97 88  Resp: '18 18 18 20  '$ Temp: 99.5 F (37.5 C) 98.7 F (37.1 C) 97.9 F (36.6 C) 97.8 F (36.6 C)  TempSrc: Oral Oral Axillary Oral  SpO2: 100% 100%  95%  Weight:      Height:       Supplemental O2: Room Air SpO2: 95 %  Filed Weights   03/08/22 1510  Weight: 77.1 kg     Intake/Output Summary (Last 24 hours) at 03/18/2022 9735 Last data filed at 03/17/2022 1533 Gross per 24 hour  Intake 100 ml  Output --  Net 100 ml    Net IO Since Admission: 1,945.7 mL [03/18/22 0842]  Physical Exam: Constitutional: alert and comfortably laying in bed, in no acute distress. Pulm: normal work of breathing on room air Abdomen: Normoactive bowel sounds. Soft, non-distended, diffuse tenderness to deep palpation, no guarding or rebound MSK: Normal bulk and tone.  Skin: Warm and dry.  Neuro: Alert and oriented x3. 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 GI path panel (+) for EPEC since previous admission, completed outpatient Cipro and received 3 day course of azithromycin at this admission per ID. Negative C diff. Flex sigmoidoscopy showed hemorrhoids and multiple ulcerations in sigmoid and descending colon. Biopsy results showed nonspecific changes but possibly ischemic injury. Previous workup include positive lactoferrin, negative TTG IgA and stool ova and parasites, normal pancreatic elastase and TSH. Autoimmune workup earlier this year was negative. GI plans to see her today, possibly repeat CT and flex sigmoidoscopy. ID recommends workup for chronic diarrhea of non-infectious etiologies. On trial of antibiotics now and increased dose of imodium with less gas and explosive diarrhea. Exam today continues to have diffuse tenderness to deep palpation but no signs of acute abdomen.  -GI and ID consulted, appreciate both recommendations -continue Imodium 4 mg QID -continue rocephin 2g (day 2/5)  -continue Glucerna and Banatrol, appreciate RD consult and  recommendations -Gerhardt's butt cream for skin irritation due to persistent diarrhea -strict I&Os  #Normocytic anemia S/p 2 units PRBC. Hgb has remained stable. No signs of bleeding.  #Type II diabetes mellitus A1c during last admission 7.2%.  She is tolerating some po intake. Will follow closely as diet improves and may need to adjust insulin regimen. PTA she was on metformin.  -continue semglee 10 units QHS and SSI ACHS   #Paroxysmal atrial fibrillation not on anticoagulation #Hypertension Last EKG showed sinus tachycardia. She remains normotensive. Orthostatic vitals were normal. Holding home losartan given initial AKI.  -continue home diltiazem and metoprolol    #Hypokalemia #Hypovolemic hyponatremia, resolved Na 133 today but corrected to 135 in setting of hyperglycemia. She is eating a few spoonfuls of food throughout the day. Slowly improving po intake. Discussed electrolyte drinks and she has Propel here. Replenished potassium and magnesium yesterday.    #Thrombocytosis Platelets remain elevated. Likely reactive to infection or inflammation given ongoing diarrhea and episodes of bloody stools.   #Depression Patient has depressed mood given no significant improvement in her chronic diarrhea. Offered support and discussed next steps in addressing her chronic diarrhea. She is continuing with her home venlafaxine.  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 Irene Limbo and last note states she has completed Bendamustine Rituxan chemotherapy followed by 18 months of Rituxan maintenance with last dose on 02/25/2021.    Best Practice: Diet: carb modified IVF: None VTE: Place and maintain sequential compression device Start: 03/09/22 0858 Code: Full DISPO: Anticipated discharge to Home pending  medical stability .  Signature: Angelique Blonder, DO Internal Medicine Resident, PGY-1 [facility]  Pager: [phone]   Please contact the on call pager after 5 pm and on weekends at [phone].

## 2022-03-19 DIAGNOSIS — A09 Infectious gastroenteritis and colitis, unspecified: Secondary | ICD-10-CM | POA: Diagnosis not present

## 2022-03-19 DIAGNOSIS — B9620 Unspecified Escherichia coli [E. coli] as the cause of diseases classified elsewhere: Secondary | ICD-10-CM | POA: Diagnosis not present

## 2022-03-19 LAB — GLUCOSE, CAPILLARY
Glucose-Capillary: 270 mg/dL — ABNORMAL HIGH (ref 70–99)
Glucose-Capillary: 267 mg/dL — ABNORMAL HIGH (ref 70–99)
Glucose-Capillary: 228 mg/dL — ABNORMAL HIGH (ref 70–99)
Glucose-Capillary: 331 mg/dL — ABNORMAL HIGH (ref 70–99)
Glucose-Capillary: 258 mg/dL — ABNORMAL HIGH (ref 70–99)

## 2022-03-19 MED ORDER — LORATADINE 10 MG PO TABS
10.0000 mg | ORAL_TABLET | Freq: Every day | ORAL | Status: DC | PRN
  Administered 2022-03-19 – 2022-03-20 (×2): 10 mg via ORAL
  Filled 2022-03-19 (×2): qty 1

## 2022-03-19 MED ORDER — LORATADINE 10 MG PO TABS: 10.0000 mg | ORAL_TABLET | Freq: Every day | ORAL | Status: DC

## 2022-03-19 NOTE — Progress Notes (Addendum)
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   Overnight Events: none  Interim History:  Patient reports improvement this morning. Able to ambulate around the unit. No diarrhea for about 6 hours last night. Diarrhea is less liquid and more of a dark brown paste. She was able to eat about half her omelet this morning. Patient's mood appeared more cheerful today.   OBJECTIVE:  Vital Signs: Vitals:   03/18/22 2127 03/18/22 2138 03/19/22 0504 03/19/22 0741  BP:  133/78 132/73 136/81  Pulse: (!) 105 (!) 110 88 (!) 106  Resp: '16 19 18 16  '$ Temp:  98.2 F (36.8 C) 97.7 F (36.5 C) 98.6 F (37 C)  TempSrc:  Oral Oral Oral  SpO2: 96% 98% 99% 100%  Weight:      Height:       Supplemental O2: Room Air SpO2: 100 %  Filed Weights   03/08/22 1510  Weight: 77.1 kg     Intake/Output Summary (Last 24 hours) at 03/19/2022 1148 Last data filed at 03/19/2022 0935 Gross per 24 hour  Intake 510 ml  Output 1865 ml  Net -1355 ml   Net IO Since Admission: 830.7 mL [03/19/22 1148]  Physical Exam: Constitutional: alert and sitting up in bed comfortably, in no acute distress. Pulm: normal work of breathing on room air Abdomen: Soft, non-distended, still diffuse tenderness to deep palpation, no guarding or rebound MSK: Normal bulk and tone.  Skin: Warm and dry.  Neuro: Alert and oriented x3. 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   Plan: #Colitis, suspect 2/2 enteropathogenic E. Coli versus inflammatory colitis Patient with chronic diarrhea with no further episodes of hematochezia. GI path panel (+) for EPEC since previous admission, completed outpatient Cipro and received 3 day course of azithromycin at this admission per ID. Negative C diff. Flex sigmoidoscopy showed hemorrhoids and multiple ulcerations in sigmoid and descending colon. Biopsy results showed nonspecific changes but possibly ischemic injury. Previous workup include positive lactoferrin, negative TTG IgA and stool ova and parasites, normal pancreatic elastase and TSH. Autoimmune workup earlier this year was negative.  GI started budesonide yesterday. Still on imodium and rocephin. Patient reports improvement and appears more comfortable today. She notes less frequency in her diarrhea. If diarrhea frequency continues to decrease then could consider outpatient follow up.  -GI and ID consulted, appreciate both recommendations -continue Imodium 4 mg QID -continue rocephin 2g (day 3/5)  -continue budesonide 9 mg daily -continue  Glucerna and Banatrol, appreciate RD consult and recommendations -Gerhardt's butt cream for skin irritation due to persistent diarrhea -strict I&Os  #Normocytic anemia S/p 2 units PRBC. Hgb has remained stable. No signs of bleeding.  #Type II diabetes  mellitus A1c during last admission 7.2%. PTA she was on metformin. CBG increasing to 200s since starting budesonide. May need to adjust semglee or SSI regimen for better glycemic control.  -continue semglee 10 units QHS  -continue SSI ACHS   #Paroxysmal atrial fibrillation not on anticoagulation #Hypertension Last EKG showed sinus tachycardia. She remains normotensive. Orthostatic vitals were normal. Holding home losartan given initial AKI.  -continue home diltiazem and metoprolol    #Hypokalemia, resolved #Hypovolemic hyponatremia, resolved Last Na 133 but corrected to 135 in setting of hyperglycemia. She is eating more food now. Replenished potassium and magnesium.    #Thrombocytosis Platelets remain elevated. Likely reactive to infection or inflammation given ongoing diarrhea and episodes of bloody stools.   #Depression Patient's mood today is more cheerful given improvement in diarrhea. She is continuing with her home venlafaxine.  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 Irene Limbo and last note states she has completed Bendamustine Rituxan chemotherapy followed by 18 months of Rituxan maintenance with last dose on 02/25/2021.    Best Practice: Diet: carb modified IVF: None VTE: heparin injection 5,000 Units Start: 03/18/22 1400 Place and maintain sequential compression device Start: 03/09/22 0858 Code: Full DISPO: Anticipated discharge to Home pending  medical stability .  Signature: Angelique Blonder, DO Internal Medicine Resident, PGY-1 [facility]  Pager: [phone]   Please contact the on call pager after 5 pm and on weekends at [phone].

## 2022-03-19 NOTE — Plan of Care (Signed)
  Problem: Education: Goal: Ability to describe self-care measures that may prevent or decrease complications (Diabetes Survival Skills Education) will improve Outcome: Progressing Goal: Individualized Educational Video(s) Outcome: Progressing   Problem: Coping: Goal: Ability to adjust to condition or change in health will improve Outcome: Progressing   Problem: Education: Goal: Knowledge of General Education information will improve Description: Including pain rating scale, medication(s)/side effects and non-pharmacologic comfort measures Outcome: Progressing   Problem: Coping: Goal: Level of anxiety will decrease Outcome: Progressing   Problem: Pain Managment: Goal: General experience of comfort will improve Outcome: Progressing   Problem: Safety: Goal: Ability to remain free from injury will improve Outcome: Progressing   Problem: Skin Integrity: Goal: Risk for impaired skin integrity will decrease Outcome: Progressing

## 2022-03-19 NOTE — Progress Notes (Signed)
[facility] Gastroenterology Progress Note  Meghan Alexander 66 y.o. Sep 09, 1955  CC: Diarrhea   Subjective: Patient seen and examined at bedside.  Family at bedside.  Feeling much better today.  Diarrhea improved significantly.  Having 3-4 bowel movements now.  Abdominal pain also improved.  ROS : Afebrile, negative for chest pain   Objective: Vital signs in last 24 hours: Vitals:   03/19/22 0504 03/19/22 0741  BP: 132/73 136/81  Pulse: 88 (!) 106  Resp: 18 16  Temp: 97.7 F (36.5 C) 98.6 F (37 C)  SpO2: 99% 100%    Physical Exam:  General:  Alert, cooperative, no distress, appears stated age  Head:  Normocephalic, without obvious abnormality, atraumatic  Eyes:  , EOM's intact,   Lungs:   No visible respiratory distress  Heart:  Regular rate and rhythm, S1, S2 normal  Abdomen:   Soft, non-tender, distended, bowel sounds present  Extremities: Extremities normal, atraumatic, no  edema  Pulses: 2+ and symmetric    Lab Results: Recent Labs    03/17/22 0149 03/18/22 0236  NA 131* 133*  K 3.6 3.6  CL 92* 95*  CO2 26 27  GLUCOSE 169* 218*  BUN 7* 6*  CREATININE 0.91 0.85  CALCIUM 8.1* 8.3*   No results for input(s): "AST", "ALT", "ALKPHOS", "BILITOT", "PROT", "ALBUMIN" in the last 72 hours. Recent Labs    03/17/22 0149  WBC 9.0  HGB 9.2*  HCT 28.7*  MCV 85.2  PLT 694*   No results for input(s): "LABPROT", "INR" in the last 72 hours.    Assessment/Plan: -Diarrhea.  History of enteropathogenic E. coli infection s/p treatment.  Repeat GI pathogen panel on November 13 again showed enteropathogenic E. coli.  C. difficile - November 14 flexible sigmoidoscopy 11/16 for evaluation of ongoing diarrhea showed inflammation in the sigmoid colon and descending colon.  Biopsies showed acute colitis.  Immunochemical stain negative for HSV and CMV.  Recommendations -------------------------- -She was treated x 2 with course of azithromycin.  She is now on Rocephin was started  on November 23.Marland Kitchen  Also started on budesonide yesterday. -Significant improvement in diarrhea and abdominal pain.  Continue current management for now. -Continue current management.  GI will follow.   Otis Brace MD, [place] 03/19/2022, 1:18 PM  Contact #  [phone]

## 2022-03-20 DIAGNOSIS — A09 Infectious gastroenteritis and colitis, unspecified: Secondary | ICD-10-CM | POA: Diagnosis not present

## 2022-03-20 DIAGNOSIS — B9620 Unspecified Escherichia coli [E. coli] as the cause of diseases classified elsewhere: Secondary | ICD-10-CM | POA: Diagnosis not present

## 2022-03-20 LAB — BASIC METABOLIC PANEL
Anion gap: 11 (ref 5–15)
BUN: 12 mg/dL (ref 8–23)
CO2: 25 mmol/L (ref 22–32)
Calcium: 8.6 mg/dL — ABNORMAL LOW (ref 8.9–10.3)
Chloride: 101 mmol/L (ref 98–111)
Creatinine, Ser: 0.86 mg/dL (ref 0.44–1.00)
GFR, Estimated: >60 mL/min (ref >60)
Glucose, Bld: 289 mg/dL — ABNORMAL HIGH (ref 70–99)
Potassium: 4.2 mmol/L (ref 3.5–5.1)
Sodium: 137 mmol/L (ref 135–145)

## 2022-03-20 LAB — GLUCOSE, CAPILLARY
Glucose-Capillary: 155 mg/dL — ABNORMAL HIGH (ref 70–99)
Glucose-Capillary: 190 mg/dL — ABNORMAL HIGH (ref 70–99)
Glucose-Capillary: 190 mg/dL — ABNORMAL HIGH (ref 70–99)
Glucose-Capillary: 267 mg/dL — ABNORMAL HIGH (ref 70–99)

## 2022-03-20 LAB — CBC
HCT: 31.5 % — ABNORMAL LOW (ref 36.0–46.0)
Hemoglobin: 10.1 g/dL — ABNORMAL LOW (ref 12.0–15.0)
MCH: 27.4 pg (ref 26.0–34.0)
MCHC: 32.1 g/dL (ref 30.0–36.0)
MCV: 85.4 fL (ref 80.0–100.0)
Platelets: 618 10*3/uL — ABNORMAL HIGH (ref 150–400)
RBC: 3.69 MIL/uL — ABNORMAL LOW (ref 3.87–5.11)
RDW: 14.6 % (ref 11.5–15.5)
WBC: 6.9 10*3/uL (ref 4.0–10.5)
nRBC: 0 % (ref 0.0–0.2)

## 2022-03-20 MED ORDER — LOSARTAN POTASSIUM 25 MG PO TABS
25.0000 mg | ORAL_TABLET | Freq: Every day | ORAL | Status: DC
  Administered 2022-03-20 – 2022-03-22 (×3): 25 mg via ORAL
  Filled 2022-03-20 (×3): qty 1

## 2022-03-20 NOTE — Progress Notes (Signed)
Mobility Specialist Progress Note   03/20/22 1350  Mobility  Activity Ambulated independently in room;Dangled on edge of bed  Level of Assistance Modified independent, requires aide device or extra time  Assistive Device Four wheel walker  Distance Ambulated (ft) 25 ft  Range of Motion/Exercises Active;All extremities  Activity Response Tolerated well   Patient received ambulating in room independently. Agreeable to participate in mobility initially but deferred further mobility as visitor arrived during session. Tolerated without complaint or incident. Was left dangling EOB with all needs met, call bell in reach.   [place] Kaspar Albornoz, BS EXP Mobility Specialist Please contact via SecureChat or Rehab office at [phone]

## 2022-03-20 NOTE — Progress Notes (Signed)
[facility] Gastroenterology Progress Note  Meghan Alexander 66 y.o. 1955-05-13  CC: Diarrhea   Subjective: Patient seen and examined at bedside.  Family at bedside.  Feeling much better today.  Diarrhea improved significantly.  Only having 2 bowel movements.  Abdominal pain resolved.  ROS : Afebrile, negative for chest pain   Objective: Vital signs in last 24 hours: Vitals:   03/20/22 0539 03/20/22 0804  BP: 136/72 (!) 144/72  Pulse: 85 81  Resp: 19 18  Temp: (!) 97.4 F (36.3 C) 98.1 F (36.7 C)  SpO2: 100% 100%    Physical Exam:  General:  Alert, cooperative, no distress, appears stated age  Head:  Normocephalic, without obvious abnormality, atraumatic  Eyes:  , EOM's intact,   Lungs:   No visible respiratory distress  Heart:  Regular rate and rhythm, S1, S2 normal  Abdomen:   Soft, non-tender, distended, bowel sounds present  Extremities: Extremities normal, atraumatic, no  edema  Pulses: 2+ and symmetric    Lab Results: Recent Labs    03/18/22 0236 03/20/22 0239  NA 133* 137  K 3.6 4.2  CL 95* 101  CO2 27 25  GLUCOSE 218* 289*  BUN 6* 12  CREATININE 0.85 0.86  CALCIUM 8.3* 8.6*   No results for input(s): "AST", "ALT", "ALKPHOS", "BILITOT", "PROT", "ALBUMIN" in the last 72 hours. Recent Labs    03/20/22 0239  WBC 6.9  HGB 10.1*  HCT 31.5*  MCV 85.4  PLT 618*   No results for input(s): "LABPROT", "INR" in the last 72 hours.    Assessment/Plan: -Diarrhea.  History of enteropathogenic E. coli infection s/p treatment.  Repeat GI pathogen panel on November 13 again showed enteropathogenic E. coli.  C. difficile - November 14 flexible sigmoidoscopy 11/16 for evaluation of ongoing diarrhea showed inflammation in the sigmoid colon and descending colon.  Biopsies showed acute colitis.  Immunochemical stain negative for HSV and CMV.  Recommendations -------------------------- -Diarrhea improved significantly now.  Recommend to continue budesonide 9 mg/day for 4  weeks followed by budesonide 6 mg/day for  2 weeks and then budesonide 3 mg/day for  2 weeks. -Follow-up with Dr. Paulita Fujita in 2 to 3 months after discharge. -No further inpatient GI workup planned.  GI will sign off.  Call us back if needed   Otis Brace MD, [place] 03/20/2022, 2:35 PM  Contact #  [phone]

## 2022-03-20 NOTE — Progress Notes (Signed)
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   Overnight Events: no acute events overnight  Meghan Alexander is resting in bed comfortably, she is in good spirits this morning and endorses significant decrease in bowel movements, only two small bowel movements overnight. Her stools are still loose. She isn't sure if this occurred after the abx or steroids. Her diet has increased and her abdominal pain has improved. She also endorses diffuse itching over the last 2 days, denies rash being present. She has had similar symptoms in the past, but takes benadryl nightly.   With her significant improvement we discussed discharging home today and following outpatient with GI, she is hesitant with having returned immediately after prior discharge and the fact her stools are still loose. Will check back in this afternoon and discuss with GI if they have plans for any further intervention.   Objective:  Vital signs in last 24 hours: Vitals:   03/19/22 1601 03/19/22 2034 03/20/22 0539 03/20/22 0804  BP: 125/67 119/66 136/72 (!) 144/72  Pulse: (!) 102 98 85 81  Resp: '16 18 19 18  '$ Temp: 98 F (36.7 C) 97.8 F (36.6 C) (!) 97.4 F (36.3 C) 98.1 F (36.7 C)  TempSrc: Oral Oral Oral Oral  SpO2: 99% 96% 100% 100%  Weight:      Height:       Physical Exam:  Constitutional: no acute distress HENT: normocephalic atraumatic Eyes: conjunctiva non-erythematous Neck: supple Pulmonary/Chest: normal work of breathing on room air Abdominal: soft, non-tender, non-distended MSK: normal bulk and tone Neurological: alert & oriented x 3 Skin: warm and dry No rash  present Psych: normal mood  Filed Weights   03/08/22 1510  Weight: 77.1 kg    Intake/Output Summary (Last 24 hours) at 03/20/2022 0903 Last data filed at 03/19/2022 0935 Gross per 24 hour  Intake 240 ml  Output --  Net 240 ml   Net IO Since Admission: 830.7 mL [03/20/22 0903]  Pertinent Labs:    Latest Ref Rng & Units 03/20/2022    2:39 AM 03/17/2022    1:49 AM 03/16/2022    4:10 AM  CBC  WBC 4.0 - 10.5 K/uL 6.9  9.0  8.8   Hemoglobin 12.0 - 15.0 g/dL 10.1  9.2  9.2   Hematocrit 36.0 - 46.0 % 31.5  28.7  27.4   Platelets 150 - 400 K/uL 618  694  654        Latest Ref Rng & Units 03/20/2022    2:39 AM 03/18/2022    2:36 AM 03/17/2022    1:49 AM  CMP  Glucose 70 - 99 mg/dL 289  218  169   BUN 8 - 23 mg/dL '12  6  7   '$ Creatinine 0.44 - 1.00 mg/dL 0.86  0.85  0.91   Sodium 135 - 145 mmol/L 137  133  131   Potassium 3.5 - 5.1 mmol/L 4.2  3.6  3.6   Chloride 98 - 111 mmol/L 101  95  92   CO2 22 - 32 mmol/L '25  27  26   '$ Calcium 8.9 - 10.3 mg/dL 8.6  8.3  8.1    Assessment/Plan:   Principal Problem:   Infectious colitis  Patient Summary:  Meghan Alexander is 66 yo person with PAF not on anticoagulation, insulin-dependent type II diabetes mellitus, esophageal achalasia, 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 She has had improvement in her symptoms have improved with abx and budesonide.   Colitis, inflammatory vs infectious Significant improvement in number of bowel movements since starting IV abx and budesonide. Unclear if this is a result of starting abx or PO steroids, though did see drastic improvement day after steroids. Will complete course of ceftriaxone and continue with budesonide. Discussed may take a few days to have formed stools. Possible discharge  today or tomorrow and follow up outpatient with GI.  -GI and ID consulted, appreciate both recommendations -continue Imodium 4 mg QID -continue rocephin 2g (day 4/5), if DC today transition to cefdinir for one dose tomorrow -continue budesonide 9 mg daily -continue Glucerna and Banatrol -Gerhardt's cream for skin irritation due to persistent diarrhea -strict I&Os   #Normocytic anemia Hgb has remained stable. No further episodes of bloody bm's.    #Type II diabetes mellitus A1c during last admission 7.2%. PTA she was on metformin. Daily CBG's rising with increasing diet, low systemic absorption of budesonide so do not believe main reason for rise.  -continue semglee 10 units QHS  -continue SSI ACHS   #Paroxysmal atrial fibrillation not on anticoagulation #Hypertension She remains with a regular rhythm and BP slightly elevated this am. Anticoagulation discontinued in the past as she has remained in sinus rhythm. Now that she is eating and drinking well will plan to restart home losartan.  -continue home diltiazem and metoprolol  -restart home losartan   #Hypokalemia, resolved #Hypovolemic hyponatremia, resolved Electrolytes showing improvement with increased PO intake and decrease amount of stools.   #Thrombocytosis Platelets remain elevated, down trending. Likely reactive in setting of colitis.    #Depression Patient's mood continues to be improved since decreased stools. Her family is coming by today and encouraged her to walk around with them.  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 Irene Limbo and last note states she has completed Bendamustine Rituxan chemotherapy followed by 18 months of Rituxan maintenance with last dose on 02/25/2021 -continue to follow up as needed outpatient  Diet: Carb-Modified IVF: None, VTE: Heparin Code: Full  Dispo: Anticipated discharge  to Home in 1-2 days days   [facility] Resident PGY-3 Please contact the on call pager after 5 pm and on weekends at [phone].

## 2022-03-20 NOTE — Progress Notes (Signed)
Has 2 small diarrhea overnight.

## 2022-03-21 ENCOUNTER — Other Ambulatory Visit: Payer: Self-pay

## 2022-03-21 DIAGNOSIS — C8218 Follicular lymphoma grade II, lymph nodes of multiple sites: Secondary | ICD-10-CM

## 2022-03-21 DIAGNOSIS — A09 Infectious gastroenteritis and colitis, unspecified: Secondary | ICD-10-CM | POA: Diagnosis not present

## 2022-03-21 DIAGNOSIS — B9620 Unspecified Escherichia coli [E. coli] as the cause of diseases classified elsewhere: Secondary | ICD-10-CM | POA: Diagnosis not present

## 2022-03-21 LAB — GLUCOSE, CAPILLARY
Glucose-Capillary: 210 mg/dL — ABNORMAL HIGH (ref 70–99)
Glucose-Capillary: 167 mg/dL — ABNORMAL HIGH (ref 70–99)
Glucose-Capillary: 180 mg/dL — ABNORMAL HIGH (ref 70–99)
Glucose-Capillary: 348 mg/dL — ABNORMAL HIGH (ref 70–99)

## 2022-03-21 MED ORDER — DIPHENHYDRAMINE HCL 25 MG PO CAPS
25.0000 mg | ORAL_CAPSULE | Freq: Four times a day (QID) | ORAL | Status: DC | PRN
  Administered 2022-03-21 – 2022-03-22 (×2): 25 mg via ORAL
  Filled 2022-03-21 (×2): qty 1

## 2022-03-21 NOTE — Progress Notes (Signed)
  Progress Note   Date: 03/21/2022  Patient Name: Meghan Alexander        MRN#: [id number]  Clarification of diagnosis:  Acute on chronic anemia due to blood loss

## 2022-03-21 NOTE — Progress Notes (Signed)
Nutrition Follow-up  DOCUMENTATION CODES:   Not applicable  INTERVENTION:   Continue Glucerna Shake po TID, each supplement provides 220 kcal and 10 grams of protein Continue Banatrol - BID  Continue Multivitamin w/ minerals daily  NUTRITION DIAGNOSIS:   Inadequate oral intake related to decreased appetite as evidenced by per patient/family report. - Progressing   GOAL:   Patient will meet greater than or equal to 90% of their needs - Progressing  MONITOR:   Supplement acceptance, PO intake, Labs, Weight trends  REASON FOR ASSESSMENT:   Consult Assessment of nutrition requirement/status  ASSESSMENT:   66 y.o. female presented to the ED with abdominal pain, poor PO intake, nausea, and diarrhea. Pt recently admitted 11/7-11/8 for infectious colitis due to E. Coli. PMH includes T2DM, HTN, GERD, and follicular lymphoma s/p chemotherapy. Pt admitted with infectious colitis and AKI.  Pt sitting up in bed. Happy that she has been eating better and that her diarrhea has slowed down. Happy that she can sleep more than an hour or two periods overnight. Reports that she is drinking the Glucerna and taking the [place], likes both. Pt asked about Banatrol once discharged, reviewed places banana flakes can be purchased.   Meal Intake  11/25-11/26: 100% x 2 meals    Medications reviewed and include: Budesonide, NovoLog SSI, Semglee, Imodium, MVI Labs reviewed: 24 hr CBGs 155-267  Diet Order:   Diet Order             Diet Carb Modified Fluid consistency: Thin; Room service appropriate? Yes  Diet effective now                   EDUCATION NEEDS:   No education needs have been identified at this time  Skin:  Skin Assessment: Reviewed RN Assessment  Last BM:  11/26  Height:   Ht Readings from Last 1 Encounters:  03/08/22 '5\' 2"'$  (1.575 m)    Weight:   Wt Readings from Last 1 Encounters:  03/08/22 77.1 kg    Ideal Body Weight:  50 kg  BMI:  Body mass index is  31.09 kg/m.  Estimated Nutritional Needs:   Kcal:  1800-2000  Protein:  90-105 grams  Fluid:  >/= 1.8 L    Hermina Barters RD, LDN Clinical Dietitian See [facility] for contact information.

## 2022-03-21 NOTE — Progress Notes (Signed)
    [facility] for Infectious Disease    Date of Admission:  03/07/2022   Total days of antibiotics 5   ID: Meghan Alexander is a 66 y.o. female with   Principal Problem:   Infectious colitis    Subjective: Feeling better, less diarrhea. No nausea/vomiting  Medications:   budesonide  9 mg Oral Daily   diltiazem  240 mg Oral Daily   feeding supplement (GLUCERNA SHAKE)  237 mL Oral TID BM   fiber supplement (BANATROL TF)  60 mL Oral BID   Gerhardt's butt cream   Topical BID   heparin injection (subcutaneous)  5,000 Units Subcutaneous Q8H   insulin aspart  0-15 Units Subcutaneous TID WC   insulin glargine-yfgn  10 Units Subcutaneous QHS   loperamide  4 mg Oral QID   losartan  25 mg Oral Daily   metoprolol succinate  25 mg Oral Daily   mometasone-formoterol  2 puff Inhalation BID   multivitamin with minerals  1 tablet Oral Daily   venlafaxine XR  37.5 mg Oral Q breakfast    Objective: Vital signs in last 24 hours: Temp:  [97.5 F (36.4 C)-98.2 F (36.8 C)] 98.1 F (36.7 C) (11/27 1621) Pulse Rate:  [92-110] 110 (11/27 1621) Resp:  [16-22] 16 (11/27 1621) BP: (118-152)/(64-82) 144/79 (11/27 1621) SpO2:  [96 %-98 %] 98 % (11/27 1621)  Physical Exam  Constitutional:  oriented to person, place, and time. appears well-developed and well-nourished. No distress.  HENT: [place]/AT, PERRLA, no scleral icterus Mouth/Throat: Oropharynx is clear and moist. No oropharyngeal exudate.  Cardiovascular: Normal rate, regular rhythm and normal heart sounds. Exam reveals no gallop and no friction rub.  No murmur heard.  Pulmonary/Chest: Effort normal and breath sounds normal. No respiratory distress.  has no wheezes.  Neck = supple, no nuchal rigidity Abdominal: Soft. Bowel sounds are normal.  exhibits no distension. There is no tenderness.  Lymphadenopathy: no cervical adenopathy. No axillary adenopathy Neurological: alert and oriented to person, place, and time.  Skin: Skin is warm and  dry. No rash noted. No erythema.  Psychiatric: a normal mood and affect.  behavior is normal.    Lab Results Recent Labs    03/20/22 0239  WBC 6.9  HGB 10.1*  HCT 31.5*  NA 137  K 4.2  CL 101  CO2 25  BUN 12  CREATININE 0.86    Microbiology: reviewed Studies/Results: No results found.   Assessment/Plan: Chronic diarrhea = previously treated for ETEC, and recently retreated despite no leukocytosis, but only having minimal response to immodium. Has been started on steroids which appears to improve her output. Recommend to follow up with GI. No need for ID follow up.   [facility] Pager: [phone]  03/21/2022, 5:16 PM

## 2022-03-21 NOTE — Progress Notes (Signed)
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   Overnight Events: none  Meghan Alexander reports feeling better today. Less diarrhea and tolerating more po intake. Still reports night sweats which has been ongoing for months.   Objective:  Vital signs in last 24 hours: Vitals:   03/20/22 2037 03/21/22 0538 03/21/22 0744 03/21/22 0917  BP: 118/64 (!) 142/74 (!) 152/82   Pulse: 95 92 99 (!) 104  Resp: _0 (!) 22  Temp: 98.2 F (36.8 C) 98.1 F (36.7 C) (!) 97.5 F (36.4 C)   TempSrc: Oral Oral Oral   SpO2: 96% 98% 98% 98%  Weight:      Height:       Physical Exam:  Constitutional: no acute distress HENT: normocephalic atraumatic Neck: supple Pulmonary/Chest: normal work of breathing on room air Abdominal: soft, non-distended, less diffuse tenderness today MSK: normal bulk and tone Neurological: alert & oriented x 3 Skin: warm and dry Psych: normal mood  Filed Weights   03/08/22 1510  Weight: 77.1 kg    Intake/Output Summary (Last 24 hours) at 03/21/2022 1431 Last data filed at 03/20/2022 1814 Gross per 24 hour  Intake 200 ml  Output --  Net 200 ml   Net IO Since Admission: 1,210.7 mL [03/21/22 1431]  Pertinent Labs:    Latest Ref Rng & Units 03/20/2022    2:39 AM 03/17/2022    1:49 AM 03/16/2022    4:10 AM  CBC  WBC 4.0 - 10.5 K/uL 6.9  9.0  8.8   Hemoglobin 12.0 - 15.0 g/dL 10.1  9.2  9.2   Hematocrit 36.0 - 46.0 % 31.5  28.7  27.4   Platelets 150 - 400 K/uL 618  694  654        Latest Ref Rng & Units 03/20/2022    2:39 AM 03/18/2022    2:36 AM 03/17/2022    1:49 AM  CMP  Glucose 70 - 99 mg/dL 289  218  169    BUN 8 - 23 mg/dL _1 Creatinine 0.44 - 1.00 mg/dL 0.86  0.85  0.91   Sodium 135 - 145 mmol/L 137  133  131   Potassium 3.5 - 5.1 mmol/L 4.2  3.6  3.6   Chloride 98 - 111 mmol/L 101  95  92   CO2 22 - 32 mmol/L _2 Calcium 8.9 - 10.3 mg/dL 8.6  8.3  8.1    Assessment/Plan:   Principal Problem:   Infectious colitis  Patient Summary: Meghan Alexander is 66 yo person with PAF not on anticoagulation, insulin-dependent type II diabetes mellitus, esophageal achalasia, 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 She has had improvement in her symptoms have improved with abx and budesonide.   #Colitis, inflammatory vs infectious Significant improvement in her diarrhea frequency since starting IV abx and budesonide. Unclear if this is result of starting abx or steroids, although large change day after steroids. Will complete course of ceftriaxone today and continue with budesonide taper at discharge. Plan for  outpatient GI follow up. -GI and ID consulted, appreciate both recommendations -continue Imodium 4 mg QID -continue rocephin 2g (day 5/5) -continue budesonide 9 mg daily, long taper at discharge -continue Glucerna and Banatrol -Gerhardt's cream for skin irritation due to persistent diarrhea -strict I&Os  #Night Sweats Patient reports nightly sweats that soak her pajamas. This has been going on for months and per chart review prior episodes in past years. ESR and CRP remain elevated during previous admission. QuantiFERON-TB Gold negative. TSH normal. HIV negative. LFTs normal. Echo normal. Autoimmune workup done previously has been negative. Prior CT chest did not show definitive cause for the night sweats. Appears further workup may include bone marrow biopsy. She may  benefit from further outpatient workup for her diaphoresis.    #Normocytic anemia Hgb has remained stable. No further episodes of bloody stools.    #Type II diabetes mellitus A1c during last admission 7.2%. PTA she was on metformin. CBG steadily increasing likely due to improved po intake. Low systemic absorption of budesonide so do not believe main reason for rise.  -continue semglee 10 units QHS  -continue SSI ACHS   #Paroxysmal atrial fibrillation not on anticoagulation #Hypertension Anticoagulation discontinued in the past as she has remained in sinus rhythm. BP elevated today. Will continue her home medications.  -continue home diltiazem and metoprolol  -continue home losartan   #Hypokalemia, resolved #Hypovolemic hyponatremia, resolved Electrolytes disturbances resolved with increased PO intake and decrease amount of stools.   #Thrombocytosis Platelets remain elevated, although down trending. Likely reactive in setting of colitis.    #Depression Patient appears more cheerful and improved mood given improvement in her diarrhea.  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 Irene Limbo and last note states she has completed Bendamustine Rituxan chemotherapy followed by 18 months of Rituxan maintenance with last dose on 02/25/2021 -continue to follow up as needed outpatient  Diet: Carb-Modified IVF: None, VTE: Heparin Code: Full  Dispo: Anticipated discharge to Home in 1 day  Angelique Blonder, DO Internal Medicine Resident PGY-1 Please contact the on call pager after 5 pm and on weekends at [phone].

## 2022-03-21 NOTE — Care Management Important Message (Signed)
Important Message  Patient Details  Name: Meghan Alexander MRN: [id number] Date of Birth: [birth year]   Medicare Important Message Given:  Yes     [name] 03/21/2022, 1:28 PM

## 2022-03-22 ENCOUNTER — Other Ambulatory Visit (HOSPITAL_COMMUNITY): Payer: Self-pay

## 2022-03-22 ENCOUNTER — Inpatient Hospital Stay: Payer: Medicare Other | Admitting: Hematology

## 2022-03-22 ENCOUNTER — Inpatient Hospital Stay: Payer: Medicare Other

## 2022-03-22 LAB — GLUCOSE, CAPILLARY
Glucose-Capillary: 228 mg/dL — ABNORMAL HIGH (ref 70–99)
Glucose-Capillary: 219 mg/dL — ABNORMAL HIGH (ref 70–99)

## 2022-03-22 MED ORDER — LOPERAMIDE HCL 2 MG PO CAPS
2.0000 mg | ORAL_CAPSULE | ORAL | 0 refills | Status: DC | PRN
  Filled 2022-03-22: qty 30, 5d supply, fill #0

## 2022-03-22 MED ORDER — BUDESONIDE 3 MG PO CPEP
ORAL_CAPSULE | ORAL | 0 refills | Status: AC
  Filled 2022-03-22: qty 84, 28d supply, fill #0
  Filled 2022-04-12: qty 42, 28d supply, fill #0

## 2022-03-22 NOTE — Progress Notes (Signed)
Discharge instructions given to pt. Pt verbalized understanding of all teaching and had no furthee questions.

## 2022-03-22 NOTE — Progress Notes (Signed)
Discharge meds delivered to patients room. Patient discharged to home with husband with all belongings and medications via wheelchair by volunteer staff.

## 2022-03-22 NOTE — Discharge Summary (Addendum)
Name: Meghan Alexander MRN: [id number] DOB: 1956/03/18 66 y.o. PCP: [name], MD  Date of Admission: 03/07/2022  1:44 PM Date of Discharge: 03/22/22 Attending Physician: Dr. Jimmye Norman  Discharge Diagnosis: Principal Problem:   Infectious colitis    Discharge Medications: Allergies as of 03/22/2022       Reactions   Latex Other (See Comments)   Blisters / skin peeling   Codeine Nausea And Vomiting, Other (See Comments)   hallucinations   Atenolol Cough   Other Other (See Comments)   DermaPlast; Used after surgery - caused swelling, itching, and wound broke open   Hydrocodone Nausea And Vomiting   Oxycodone Nausea And Vomiting        Medication List     STOP taking these medications    ciprofloxacin 500 MG tablet Commonly known as: CIPRO   diphenhydramine-acetaminophen 25-500 MG Tabs tablet Commonly known as: TYLENOL PM       TAKE these medications    Accu-Chek Guide test strip Generic drug: glucose blood Use to test blood sugars up to 4 times daily as directed.   Accu-Chek Guide w/Device Kit Use to test blood sugars up to 4 times daily.   Accu-Chek Softclix Lancets lancets Use to test blood sugars up to 4 times daily as directed.   budesonide 3 MG 24 hr capsule Commonly known as: ENTOCORT EC Take 3 capsules (9 mg total) by mouth daily for 28 days, THEN 2 capsules (6 mg total) daily for 14 days, THEN 1 capsule (3 mg total) daily for 14 days. Start taking on: March 22, 2022   Cartia XT 240 MG 24 hr capsule Generic drug: diltiazem Take 1 capsule (240 mg total) by mouth daily.   ferrous sulfate 325 (65 FE) MG tablet Take 1 tablet (325 mg total) by mouth every other day.   fluticasone-salmeterol 115-21 MCG/ACT inhaler Commonly known as: Advair HFA Inhale 2 puffs into the lungs 2 (two) times daily. What changed: when to take this   HumaLOG KwikPen 100 UNIT/ML KwikPen Generic drug: insulin lispro Inject 5 Units into the skin with breakfast,  with lunch, and with evening meal. What changed:  how much to take when to take this   insulin detemir 100 UNIT/ML FlexPen Commonly known as: LEVEMIR Inject 10 Units into the skin daily.   loperamide 2 MG capsule Commonly known as: IMODIUM Take 1 capsule (2 mg total) by mouth as needed for diarrhea or loose stools. Not to exceed 8 capsules per day   losartan 50 MG tablet Commonly known as: COZAAR Take 25 mg by mouth daily.   meloxicam 15 MG tablet Commonly known as: MOBIC Take 15 mg by mouth daily.   metFORMIN 500 MG tablet Commonly known as: GLUCOPHAGE Take 500 mg by mouth daily with breakfast.   metoprolol succinate 25 MG 24 hr tablet Commonly known as: TOPROL-XL Take 1 tablet (25 mg total) by mouth daily.   multivitamin tablet Take 1 tablet by mouth daily.   omeprazole 20 MG capsule Commonly known as: PRILOSEC Take 20 mg by mouth daily.   Pentips 32G X 4 MM Misc Generic drug: Insulin Pen Needle Use to inject insulin up to 4 times daily as directed.   rosuvastatin 10 MG tablet Commonly known as: CRESTOR Take 10 mg by mouth daily.   venlafaxine XR 37.5 MG 24 hr capsule Commonly known as: EFFEXOR-XR Take 37.5 mg by mouth daily with breakfast.        Disposition and follow-up:   Meghan Alexander  was discharged from [facility] in Good condition.  At the hospital follow up visit please address:  1.  Follow-up:  a. Colitis: check if diarrhea resolved, if she is on budesonide taper still    b. Anemia: check for bloody diarrhea, may need repeat CBC   c. Diabetes: will be on budesonide taper, defer to PCP to make adjustments for her diabetes regimen    d. Night Sweats: may need further outpatient workup given ongoing for months if not years   2.  Labs / imaging needed at time of follow-up: CBC  3.  Pending labs/ test needing follow-up: none  4.  Medication Changes  -Budesonide 9 mg daily x 4 weeks, then 6 mg daily x 2 weeks, then 3 mg  daily x 2 weeks  Follow-up Appointments: Patient will schedule follow up with GI, Dr. Paulita Fujita, in 1-2 months and primary care, Dr. Mancel Bale, in 1-2 weeks.   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 She has had improvement in her symptoms have improved with abx and budesonide.    Colitis, inflammatory vs infectious Patient presented to [facility] following a very recent admission at which time she was diagnosed with colitis due to enteropathogenic E. Coli (EPEC), who returned with similar symptoms of diarrhea, abdominal pain, nausea, poor po intake. She had completed outpatient Cipro after first hospital admission but continued to have diarrhea, and was encouraged by her PCP to return to hospital. Repeat GI panel again showed EPEC. Negative C. Diff. GI performed flex sigmoidoscopy which showed hemorrhoids and multiple ulcerations in sigmoid and descending colon. Biopsy results showed nonspecific changes but possibly ischemic injury. Previous workup include positive lactoferrin, negative TTG IgA and stool ova and parasites, normal pancreatic elastase and TSH. Autoimmune workup earlier this year was negative. She was started on imodium and ID was consulted who advised short course azithromycin.  She remained quite ill with very frequent diarrhea and inability to eat, and profound fatigue.  GI reevaluated and added on budesonide and antibiotics were resumed, with improvement in her symptoms. At time of discharge, patient reports 2-3 episodes of non-bloody diarrhea. Plan to discharge with budesonide taper with outpatient GI follow up. She had  completed antibiotics.  Paroxysmal atrial fibrillation not on anticoagulation Hypertension Home losartan, diltiazem and metoprolol were restarted in hospital.    Acute on chronic anemia due to blood loss, stable Patient presented with multiple bloody stools. Hgb dropped to 6.6 during course and received 2 units PRBC. Hgb has remained stable. Patient denies any further bloody stools and no other signs of bleeding.   Hypokalemia, resolved Hypovolemic hyponatremia, resolved Hypoalbuminemia, improving Electrolyte derangements likely due to poor po intake and frequent diarrhea. Improved as diarrhea lessened and better oral intake.    Thrombocytosis Likely reactive to infection or inflammation given ongoing diarrhea and episodes of bloody stools. Platelets are trending down at time of discharge.   Acute kidney injury, resolved Anion gap metabolic acidosis, resolved sCr 1.58 w/ GFR 35 on arrival in the setting of poor po intake and diarrhea. Renal ultrasound normal. Cr improved as increased po intake.   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 Irene Limbo and last note states she has completed Bendamustine Rituxan chemotherapy followed by 18 months of Rituxan maintenance with last dose on 02/25/2021. She will continue to follow up in outpatient setting.   Discharge Subjective: Patient reports significant improvement and only few episodes of loose stools. She denies any new concerns and feeling well. No further abdominal pain. Denies any recent bloody stools.   Discharge Exam:   BP 129/76 (BP Location: Left Arm)   Pulse (!) 110   Temp 98.3 F (36.8 C) (Oral)   Resp 16   Ht _0  (1.575 m)   Wt 77.1 kg   SpO2 99%   BMI 31.09 kg/m  Constitutional: well-appearing female, sitting in bed, in no acute distress HENT: normocephalic atraumatic Neck: supple Pulmonary/Chest: normal work of breathing on room  air Abdominal: soft, non-tender, non-distended MSK: normal bulk and tone Neurological: alert & oriented x 3 Skin: warm and dry Psych: normal mood and behavior   Pertinent Labs, Studies, and Procedures:     Latest Ref Rng & Units 03/20/2022    2:39 AM 03/17/2022    1:49 AM 03/16/2022    4:10 AM  CBC  WBC 4.0 - 10.5 K/uL 6.9  9.0  8.8   Hemoglobin 12.0 - 15.0 g/dL 10.1  9.2  9.2   Hematocrit 36.0 - 46.0 % 31.5  28.7  27.4   Platelets 150 - 400 K/uL 618  694  654        Latest Ref Rng & Units 03/20/2022    2:39 AM 03/18/2022    2:36 AM 03/17/2022    1:49 AM  CMP  Glucose 70 - 99 mg/dL 289  218  169   BUN 8 - 23 mg/dL _1 Creatinine 0.44 - 1.00 mg/dL 0.86  0.85  0.91   Sodium 135 - 145 mmol/L 137  133  131   Potassium 3.5 - 5.1 mmol/L 4.2  3.6  3.6   Chloride 98 - 111 mmol/L 101  95  92   CO2 22 - 32 mmol/L _2 Calcium 8.9 - 10.3 mg/dL 8.6  8.3  8.1     CT ABDOMEN PELVIS W CONTRAST  Result Date: 03/08/2022 CLINICAL DATA:  Abdominal pain, acute, nonlocalized worsening abdominal pain, swelling, recent colitis EXAM: CT ABDOMEN AND PELVIS WITH CONTRAST TECHNIQUE: Multidetector CT imaging of the abdomen and pelvis was performed using the standard protocol following bolus administration of intravenous contrast. RADIATION DOSE REDUCTION: This exam was performed according to the departmental dose-optimization program which includes automated exposure control, adjustment of the mA and/or kV according to patient size and/or use of iterative reconstruction technique. CONTRAST:  22m OMNIPAQUE IOHEXOL 350 MG/ML SOLN COMPARISON:  CT 03/01/2022 FINDINGS: Lower chest: Bibasilar subsegmental atelectasis. No acute findings. Bilateral breast implants noted. Hepatobiliary: Calcified granuloma in the a padded dome. Small hepatic cysts. The gallbladder is unremarkable. Pancreas: Unremarkable. No pancreatic ductal dilatation or surrounding inflammatory changes. Spleen: Multiple calcified  granulomas. Adrenals/Urinary Tract: Adrenal glands are unremarkable. No hydronephrosis or nephrolithiasis. The bladder is mildly distended. Stomach/Bowel: There is diffuse colonic wall thickening with adjacent stranding, worsened in comparison to recent CT. There is mild distension of the appendix though with intraluminal gas, likely associated with fluid distension of the cecum and ascending colon. There is a gas fluid level in the ascending colon. There is no evidence of bowel obstruction. There is no pneumatosis. Vascular/Lymphatic: Scattered atherosclerosis. No AAA. No lymphadenopathy. Reproductive:  Prior hysterectomy. Other: Tiny fat containing umbilical hernia. No bowel containing hernia. No ascites. No free air. Subcutaneous gas in the lower anterior abdominal wall likely related due medication injection. There are a few prominent reactive mesenteric lymph nodes. Musculoskeletal: No acute osseous abnormality. No suspicious osseous lesion. Multilevel degenerative changes of the spine. There is a vertebral body hemangioma in L3 and L4. There is bilateral hip osteoarthritis. IMPRESSION: Diffuse colonic wall thickening with adjacent stranding, increased in comparison to recent CT, consistent with worsening colitis. Electronically Signed   By: Maurine Simmering M.D.   On: 03/08/2022 14:22   DG Abd 1 View  Result Date: 03/08/2022 CLINICAL DATA:  66 year old female with history of diarrhea. EXAM: ABDOMEN - 1 VIEW COMPARISON:  Chest x-ray 02/28/2022. FINDINGS: There is relative paucity of bowel gas throughout the abdomen, with the exception of a prominent loop of gas-filled bowel in the upper abdomen, which appears to represent the transverse colon, measuring up to 7.2 cm in diameter. No distal rectal gas is noted. No definite pneumoperitoneum. IMPRESSION: 1. Unusual but nonobstructive bowel gas pattern, as above. 2. No pneumoperitoneum. Electronically Signed   By: Vinnie Langton M.D.   On: 03/08/2022 06:29   US  RENAL  Result Date: 03/08/2022 CLINICAL DATA:  66 year old female with history of acute kidney injury. EXAM: RENAL / URINARY TRACT ULTRASOUND COMPLETE COMPARISON:  No prior ultrasound. CT of the abdomen and pelvis 03/01/2022. FINDINGS: Right Kidney: Renal measurements: 11.1 x 5.0 x 5.4 = volume: 156.3 mL. Echogenicity within normal limits. No mass or hydronephrosis visualized. Left Kidney: Renal measurements: 11.8 x 6.1 x 5.5 = volume: 205.1 mL. Echogenicity within normal limits. No mass or hydronephrosis visualized. Bladder: Appears normal for degree of bladder distention. Other: None. IMPRESSION: 1. No acute findings. Specifically, no hydroureteronephrosis. Sonographic appearance of the kidneys is within normal limits. Electronically Signed   By: Vinnie Langton M.D.   On: 03/08/2022 05:17     Discharge Instructions: Discharge Instructions     Call MD for:  difficulty breathing, headache or visual disturbances   Complete by: As directed    Call MD for:  extreme fatigue   Complete by: As directed    Call MD for:  hives   Complete by: As directed    Call MD for:  persistant dizziness or light-headedness   Complete by: As directed    Call MD for:  persistant nausea and vomiting   Complete by: As directed    Call MD for:  severe uncontrolled pain   Complete by: As directed    Call MD for:  temperature >100.4   Complete by: As directed    Diet - low sodium heart healthy   Complete by: As directed    Increase activity slowly   Complete by: As directed        Signed: Angelique Blonder, DO 03/22/2022, 2:46 PM   Pager: [phone]

## 2022-03-22 NOTE — Discharge Instructions (Addendum)
You were hospitalized for ongoing diarrhea. Completed antibiotics during hospital and started on a steroid (budesonide) with improvement to your diarrhea. Prescription for budesonide taper. You may take Imodium as needed for your diarrhea. Please follow up with Meghan Alexander for GI follow up and steroid taper. Please schedule appointment with your primary care Meghan Alexander for recent hospitalization in 1-2 weeks. Thank you for allowing [place] to be part of your care.   Please schedule follow up appointments: -Primary Care: Meghan Alexander in 1-2 weeks -GI: Meghan Alexander in 1-2 months [phone]) [facility] [address], [place] [id number]  Please note these changes made to your medications:  *Please START taking:  -Budesonide taper: 9 mg daily for 4 weeks, then 6 mg daily for 2 weeks, then 3 mg daily for 2 weeks -Imodium as needed if diarrhea returns  Please make sure to schedule appointments for your primary care doctor and GI follow up.

## 2022-03-22 NOTE — Progress Notes (Signed)
Patient currently in room waiting for discharge medications to be delivered to the room.

## 2022-03-23 ENCOUNTER — Other Ambulatory Visit: Payer: Self-pay

## 2022-03-28 ENCOUNTER — Inpatient Hospital Stay (HOSPITAL_BASED_OUTPATIENT_CLINIC_OR_DEPARTMENT_OTHER): Payer: Medicare Other | Admitting: Hematology

## 2022-03-28 ENCOUNTER — Other Ambulatory Visit: Payer: Self-pay

## 2022-03-28 ENCOUNTER — Other Ambulatory Visit (HOSPITAL_COMMUNITY): Payer: Self-pay

## 2022-03-28 ENCOUNTER — Inpatient Hospital Stay: Payer: Medicare Other | Attending: Hematology

## 2022-03-28 VITALS — BP 147/73 | HR 109 | Temp 97.9°F | Resp 17 | Wt 167.8 lb

## 2022-03-28 DIAGNOSIS — E119 Type 2 diabetes mellitus without complications: Secondary | ICD-10-CM | POA: Diagnosis not present

## 2022-03-28 DIAGNOSIS — C8218 Follicular lymphoma grade II, lymph nodes of multiple sites: Secondary | ICD-10-CM

## 2022-03-28 DIAGNOSIS — Z803 Family history of malignant neoplasm of breast: Secondary | ICD-10-CM | POA: Diagnosis not present

## 2022-03-28 DIAGNOSIS — I700 Atherosclerosis of aorta: Secondary | ICD-10-CM | POA: Insufficient documentation

## 2022-03-28 DIAGNOSIS — K760 Fatty (change of) liver, not elsewhere classified: Secondary | ICD-10-CM | POA: Insufficient documentation

## 2022-03-28 DIAGNOSIS — Z801 Family history of malignant neoplasm of trachea, bronchus and lung: Secondary | ICD-10-CM | POA: Diagnosis not present

## 2022-03-28 DIAGNOSIS — Z8379 Family history of other diseases of the digestive system: Secondary | ICD-10-CM | POA: Insufficient documentation

## 2022-03-28 DIAGNOSIS — Z79899 Other long term (current) drug therapy: Secondary | ICD-10-CM | POA: Diagnosis not present

## 2022-03-28 LAB — CBC WITH DIFFERENTIAL (CANCER CENTER ONLY)
Abs Immature Granulocytes: 0.06 10*3/uL (ref 0.00–0.07)
Basophils Absolute: 0 10*3/uL (ref 0.0–0.1)
Basophils Relative: 0 %
Eosinophils Absolute: 0 10*3/uL (ref 0.0–0.5)
Eosinophils Relative: 0 %
HCT: 31.2 % — ABNORMAL LOW (ref 36.0–46.0)
Hemoglobin: 9.9 g/dL — ABNORMAL LOW (ref 12.0–15.0)
Immature Granulocytes: 1 %
Lymphocytes Relative: 4 %
Lymphs Abs: 0.4 10*3/uL — ABNORMAL LOW (ref 0.7–4.0)
MCH: 28 pg (ref 26.0–34.0)
MCHC: 31.7 g/dL (ref 30.0–36.0)
MCV: 88.1 fL (ref 80.0–100.0)
Monocytes Absolute: 0.5 10*3/uL (ref 0.1–1.0)
Monocytes Relative: 5 %
Neutro Abs: 8.5 10*3/uL — ABNORMAL HIGH (ref 1.7–7.7)
Neutrophils Relative %: 90 %
Platelet Count: 447 10*3/uL — ABNORMAL HIGH (ref 150–400)
RBC: 3.54 MIL/uL — ABNORMAL LOW (ref 3.87–5.11)
RDW: 15.7 % — ABNORMAL HIGH (ref 11.5–15.5)
WBC Count: 9.4 10*3/uL (ref 4.0–10.5)
nRBC: 0 % (ref 0.0–0.2)

## 2022-03-28 LAB — CMP (CANCER CENTER ONLY)
ALT: 13 U/L (ref 0–44)
AST: 7 U/L — ABNORMAL LOW (ref 15–41)
Albumin: 3.3 g/dL — ABNORMAL LOW (ref 3.5–5.0)
Alkaline Phosphatase: 80 U/L (ref 38–126)
Anion gap: 9 (ref 5–15)
BUN: 20 mg/dL (ref 8–23)
CO2: 29 mmol/L (ref 22–32)
Calcium: 9 mg/dL (ref 8.9–10.3)
Chloride: 103 mmol/L (ref 98–111)
Creatinine: 0.74 mg/dL (ref 0.44–1.00)
GFR, Estimated: >60 mL/min (ref >60)
Glucose, Bld: 188 mg/dL — ABNORMAL HIGH (ref 70–99)
Potassium: 4 mmol/L (ref 3.5–5.1)
Sodium: 141 mmol/L (ref 135–145)
Total Bilirubin: 0.4 mg/dL (ref 0.3–1.2)
Total Protein: 6 g/dL — ABNORMAL LOW (ref 6.5–8.1)

## 2022-03-28 LAB — LACTATE DEHYDROGENASE: LDH: 138 U/L (ref 98–192)

## 2022-03-28 LAB — SAMPLE TO BLOOD BANK

## 2022-03-28 MED ORDER — SACCHAROMYCES BOULARDII 500 MG PO PACK: 500.0000 mg | PACK | Freq: Two times a day (BID) | ORAL | 1 refills | Status: DC

## 2022-03-28 NOTE — Progress Notes (Signed)
  HEMATOLOGY/ONCOLOGY CLINIC NOTE  Date of Service: 03/28/22  Patient Care Team: Roberts, Ronald, MD as PCP - General (Internal Medicine) Acharya, Gayatri A, MD as PCP - Cardiology (Cardiology)  CHIEF COMPLAINTS/PURPOSE OF CONSULTATION:  Follow-up for continued evaluation and management of follicular lymphoma  . 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   INTERVAL HISTORY  [name] is a 66 y.o. 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   Patient was last seen by me on 11/16/2021 and was doing well overall.   She was hospitalized from 02/28/2022 to 03/02/2022 for diarrhea and AKI. She had CT imaging which showed colitis and stool studies were positive for enteropathogenic E. Coli. Patient was prescribed Ciprofloxacin, which she completed on 03/06/2022. She was seen at the ED on 03/07/2022 for abdominal pain and persistent diarrhea with bright and dark red blood. She had an CT Abdomin Pelvis, which showed Diffuse colonic wall thickening with adjacent stranding, increased in comparison to recent CT, consistent with worsening colitis. She was given imodium, short course of azithromycin, budesonide, and antibiotics.   Patient complains of constant diarrhea without any blood since 02/28/2022, but denies abdominal pain. She notes that her diarrhea was worse last night, 03/27/2022, due to drinking milkshake. She notes she is having about 8-10 episodes of diarrhea per day. She denies taking probiotics, but is still taking Budesonide and imodium.   Patient denies fever, chills,  and abdominal pain. However, she complains of night sweats and lower back pain. She reports new lump near her left collar bone since March.   She notes that her breathing is better and is back to normal. She notes she has one more visit with her pulmonologist.   Patient had COVID-19 in May 2023 with mild symptoms.   She has received the influenza vaccine and COVID-19 Booster, but denies RSV vaccine.   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   GERD (gastroesophageal reflux disease)    hx of reflux-went away with elevating HOB    Hypertension    Pneumonia    hx of    PONV (postoperative nausea and vomiting)    Type 2 diabetes mellitus (HCC) 08/02/2021    SURGICAL HISTORY: Past Surgical History:  Procedure Laterality Date   ABDOMINAL HYSTERECTOMY     ADENOIDECTOMY     BIOPSY  03/10/2022   Procedure: BIOPSY;  Surgeon: Magod, Marc, MD;  Location: [facility];  Service: Gastroenterology;;   BRONCHIAL BRUSHINGS  08/03/2021   Procedure: BRONCHIAL BRUSHINGS;  Surgeon: Byrum, Robert S, MD;  Location: [facility];  Service: Cardiopulmonary;;   BRONCHIAL WASHINGS  08/03/2021   Procedure: BRONCHIAL WASHINGS;  Surgeon: Byrum, Robert S, MD;  Location: [facility];  Service: Cardiopulmonary;;   cosmetic surgeries     ESOPHAGEAL MANOMETRY N/A 12/02/2015   Procedure:   ESOPHAGEAL MANOMETRY (EM);  Surgeon: [name], MD;  Location: [facility];  Service: Endoscopy;  Laterality: N/A;   ESOPHAGOGASTRODUODENOSCOPY N/A 12/02/2015   Procedure: ESOPHAGOGASTRODUODENOSCOPY (EGD);  Surgeon: [name], MD;  Location: [facility];  Service: Endoscopy;  Laterality: N/A;   FLEXIBLE SIGMOIDOSCOPY N/A 03/10/2022   Procedure: FLEXIBLE SIGMOIDOSCOPY;  Surgeon:  Clarene Essex, MD;  Location: [place];  Service: Gastroenterology;  Laterality: N/A;   lymph node removal left leg     cat scratch surgery    ORIF ANKLE FRACTURE Right 09/03/2021   Procedure: OPEN REDUCTION INTERNAL FIXATION (ORIF) ANKLE FRACTURE;  Surgeon: Willaim Sheng, MD;  Location: [place];  Service: Orthopedics;  Laterality: Right;   THROAT SURGERY     sleep apnea surgery    TONSILLECTOMY     VIDEO BRONCHOSCOPY  08/03/2021   Procedure: VIDEO BRONCHOSCOPY WITH FLUORO;  Surgeon: Collene Gobble, MD;  Location: [facility];  Service: Cardiopulmonary;;    SOCIAL HISTORY: Social History   Socioeconomic History   Marital status: Married    Spouse name: Not on file   Number of children: Not on file   Years of education: Not on file   Highest education level: Not on file  Occupational History   Not on file  Tobacco Use   Smoking status: Never   Smokeless tobacco: Never  Vaping Use   Vaping Use: Never used  Substance and Sexual Activity   Alcohol use: No    Alcohol/week: 0.0 standard drinks of alcohol   Drug use: No   Sexual activity: Not on file  Other Topics Concern   Not on file  Social History Narrative   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 [employer]   Never smoker never drinker   No drugs   Multiple allergies   Lives at home with her husband of 8 years since 2008   Social Determinants of Health   Financial Resource Strain: Not on file  Food Insecurity: Not on file  Transportation Needs: Not on file  Physical Activity: Not on file  Stress: Not on file  Social Connections: Not on file  Intimate Partner Violence: Not on file    FAMILY HISTORY: Family History  Problem Relation Age of Onset   Lung cancer Father        1982 died from it   Hernia Mother    Breast cancer Maternal Aunt        multiple    Breast cancer Maternal Grandmother    Breast cancer Paternal Grandmother    Breast cancer Cousin        cousins multiple     Colon cancer Neg Hx    Colon polyps Neg Hx    Rectal cancer Neg Hx    Stomach cancer Neg Hx     ALLERGIES:  is allergic to latex, codeine, atenolol, other, hydrocodone, and oxycodone.  MEDICATIONS:  Current Outpatient Medications  Medication Sig Dispense Refill   Accu-Chek Softclix Lancets lancets Use to test blood sugars up to 4 times daily as directed. 100 each 0   Blood Glucose Monitoring Suppl (BLOOD GLUCOSE MONITOR SYSTEM) w/Device KIT Use to test blood sugars up to 4 times daily. 1 kit 0   budesonide (ENTOCORT EC) 3 MG 24 hr capsule Take 3 capsules (9 mg total) by mouth daily for 28 days, THEN 2 capsules (6 mg total) daily for 14 days, THEN 1 capsule (3 mg total) daily for 14 days. 126 capsule 0   diltiazem (  CARDIZEM CD) 240 MG 24 hr capsule Take 1 capsule (240 mg total) by mouth daily. 30 capsule 0   ferrous sulfate 325 (65 FE) MG tablet Take 1 tablet (325 mg total) by mouth every other day. (Patient not taking: Reported on 03/07/2022) 30 tablet 0   fluticasone-salmeterol (ADVAIR HFA) 115-21 MCG/ACT inhaler Inhale 2 puffs into the lungs 2 (two) times daily. (Patient taking differently: Inhale 2 puffs into the lungs daily.) 1 each 12   glucose blood (ACCU-CHEK GUIDE) test strip Use to test blood sugars up to 4 times daily as directed. 100 each 0   HUMALOG KWIKPEN 100 UNIT/ML KwikPen Inject 5 Units into the skin with breakfast, with lunch, and with evening meal. (Patient taking differently: Inject 15 Units into the skin 2 (two) times daily.) 15 mL 11   insulin detemir (LEVEMIR) 100 UNIT/ML FlexPen Inject 10 Units into the skin daily. 15 mL 0   Insulin Pen Needle 32G X 4 MM MISC Use to inject insulin up to 4 times daily as directed. 100 each 0   loperamide (IMODIUM) 2 MG capsule Take 1 capsule (2 mg total) by mouth as needed for diarrhea or loose stools. Not to exceed 8 capsules per day 30 capsule 0   losartan (COZAAR) 50 MG tablet Take 25 mg by mouth daily.     meloxicam (MOBIC) 15 MG  tablet Take 15 mg by mouth daily.     metFORMIN (GLUCOPHAGE) 500 MG tablet Take 500 mg by mouth daily with breakfast.     metoprolol succinate (TOPROL-XL) 25 MG 24 hr tablet Take 1 tablet (25 mg total) by mouth daily. 30 tablet 0   Multiple Vitamin (MULTIVITAMIN) tablet Take 1 tablet by mouth daily.     omeprazole (PRILOSEC) 20 MG capsule Take 20 mg by mouth daily.     rosuvastatin (CRESTOR) 10 MG tablet Take 10 mg by mouth daily.     venlafaxine XR (EFFEXOR-XR) 37.5 MG 24 hr capsule Take 37.5 mg by mouth daily with breakfast.     No current facility-administered medications for this visit.    REVIEW OF SYSTEMS: Fevers no chills no night sweats Persistent nonproductive cough. Dyspnea on exertion present  PHYSICAL EXAMINATION:  There were no vitals filed for this visit.  NAD GENERAL:alert, in no acute distress and comfortable SKIN: no acute rashes, no significant lesions EYES: conjunctiva are pink and non-injected, sclera anicteric NECK: supple, no JVD LYMPH:  no palpable lymphadenopathy in the cervical, axillary or inguinal regions LUNGS: clear to auscultation b/l with normal respiratory effort HEART: regular rate & rhythm ABDOMEN:  normoactive bowel sounds , non tender, not distended. Extremity: no pedal edema PSYCH: alert & oriented x 3 with fluent speech NEURO: no focal motor/sensory deficits  LABORATORY DATA:  I have reviewed the data as listed     Latest Ref Rng & Units 03/20/2022    2:39 AM 03/17/2022    1:49 AM 03/16/2022    4:10 AM  CBC  WBC 4.0 - 10.5 K/uL 6.9  9.0  8.8   Hemoglobin 12.0 - 15.0 g/dL 10.1  9.2  9.2   Hematocrit 36.0 - 46.0 % 31.5  28.7  27.4   Platelets 150 - 400 K/uL 618  694  654        Latest Ref Rng & Units 03/20/2022    2:39 AM 03/18/2022    2:36 AM 03/17/2022    1:49 AM  CMP  Glucose 70 - 99 mg/dL 289  218  169  BUN 8 - 23 mg/dL _0 Creatinine 0.44 - 1.00 mg/dL 0.86  0.85  0.91   Sodium 135 - 145 mmol/L 137  133  131    Potassium 3.5 - 5.1 mmol/L 4.2  3.6  3.6   Chloride 98 - 111 mmol/L 101  95  92   CO2 22 - 32 mmol/L _1 Calcium 8.9 - 10.3 mg/dL 8.6  8.3  8.1    . Lab Results  Component Value Date   LDH 219 (H) 11/16/2021   05/14/2019 CT ANGIO CHEST PE W OR WO CONTRAST (Accession [id number]):   03/08/2019 Lymph Node Biopsy [id number]):      RADIOGRAPHIC STUDIES: I have personally reviewed the radiological images as listed and agreed with the findings in the report. CT ABDOMEN PELVIS W CONTRAST  Result Date: 03/08/2022 CLINICAL DATA:  Abdominal pain, acute, nonlocalized worsening abdominal pain, swelling, recent colitis EXAM: CT ABDOMEN AND PELVIS WITH CONTRAST TECHNIQUE: Multidetector CT imaging of the abdomen and pelvis was performed using the standard protocol following bolus administration of intravenous contrast. RADIATION DOSE REDUCTION: This exam was performed according to the departmental dose-optimization program which includes automated exposure control, adjustment of the mA and/or kV according to patient size and/or use of iterative reconstruction technique. CONTRAST:  59m OMNIPAQUE IOHEXOL 350 MG/ML SOLN COMPARISON:  CT 03/01/2022 FINDINGS: Lower chest: Bibasilar subsegmental atelectasis. No acute findings. Bilateral breast implants noted. Hepatobiliary: Calcified granuloma in the a padded dome. Small hepatic cysts. The gallbladder is unremarkable. Pancreas: Unremarkable. No pancreatic ductal dilatation or surrounding inflammatory changes. Spleen: Multiple calcified granulomas. Adrenals/Urinary Tract: Adrenal glands are unremarkable. No hydronephrosis or nephrolithiasis. The bladder is mildly distended. Stomach/Bowel: There is diffuse colonic wall thickening with adjacent stranding, worsened in comparison to recent CT. There is mild distension of the appendix though with intraluminal gas, likely associated with fluid distension of the cecum and ascending colon. There is a gas  fluid level in the ascending colon. There is no evidence of bowel obstruction. There is no pneumatosis. Vascular/Lymphatic: Scattered atherosclerosis. No AAA. No lymphadenopathy. Reproductive: Prior hysterectomy. Other: Tiny fat containing umbilical hernia. No bowel containing hernia. No ascites. No free air. Subcutaneous gas in the lower anterior abdominal wall likely related due medication injection. There are a few prominent reactive mesenteric lymph nodes. Musculoskeletal: No acute osseous abnormality. No suspicious osseous lesion. Multilevel degenerative changes of the spine. There is a vertebral body hemangioma in L3 and L4. There is bilateral hip osteoarthritis. IMPRESSION: Diffuse colonic wall thickening with adjacent stranding, increased in comparison to recent CT, consistent with worsening colitis. Electronically Signed   By: JMaurine SimmeringM.D.   On: 03/08/2022 14:22   DG Abd 1 View  Result Date: 03/08/2022 CLINICAL DATA:  66year old female with history of diarrhea. EXAM: ABDOMEN - 1 VIEW COMPARISON:  Chest x-ray 02/28/2022. FINDINGS: There is relative paucity of bowel gas throughout the abdomen, with the exception of a prominent loop of gas-filled bowel in the upper abdomen, which appears to represent the transverse colon, measuring up to 7.2 cm in diameter. No distal rectal gas is noted. No definite pneumoperitoneum. IMPRESSION: 1. Unusual but nonobstructive bowel gas pattern, as above. 2. No pneumoperitoneum. Electronically Signed   By: DVinnie LangtonM.D.   On: 03/08/2022 06:29   [place]NAL  Result Date: 03/08/2022 CLINICAL DATA:  66year old female with history of acute kidney injury. EXAM: RENAL / URINARY TRACT ULTRASOUND COMPLETE COMPARISON:  No prior ultrasound.  CT of the abdomen and pelvis 03/01/2022. FINDINGS: Right Kidney: Renal measurements: 11.1 x 5.0 x 5.4 = volume: 156.3 mL. Echogenicity within normal limits. No mass or hydronephrosis visualized. Left Kidney: Renal measurements:  11.8 x 6.1 x 5.5 = volume: 205.1 mL. Echogenicity within normal limits. No mass or hydronephrosis visualized. Bladder: Appears normal for degree of bladder distention. Other: None. IMPRESSION: 1. No acute findings. Specifically, no hydroureteronephrosis. Sonographic appearance of the kidneys is within normal limits. Electronically Signed   By: Daniel  Entrikin M.D.   On: 03/08/2022 05:17   CT ABDOMEN PELVIS W CONTRAST  Result Date: 03/01/2022 CLINICAL DATA:  Diarrhea with nausea and vomiting. EXAM: CT ABDOMEN AND PELVIS WITH CONTRAST TECHNIQUE: Multidetector CT imaging of the abdomen and pelvis was performed using the standard protocol following bolus administration of intravenous contrast. RADIATION DOSE REDUCTION: This exam was performed according to the departmental dose-optimization program which includes automated exposure control, adjustment of the mA and/or kV according to patient size and/or use of iterative reconstruction technique. CONTRAST:  75mL OMNIPAQUE IOHEXOL 350 MG/ML SOLN COMPARISON:  07/14/2021. FINDINGS: Lower chest: Mild atelectasis or scarring at the lung bases. Hepatobiliary: Cysts and calcified granuloma are noted in the liver. No biliary ductal dilatation. The gallbladder is without stones. Pancreas: Unremarkable. No pancreatic ductal dilatation or surrounding inflammatory changes. Spleen: Normal size.  Calcified granuloma are noted. Adrenals/Urinary Tract: Adrenal glands are stable. The kidneys enhance symmetrically. No renal calculus or hydronephrosis. The bladder is unremarkable. Stomach/Bowel: In the distal esophagus is patulous and unchanged from the prior exam. Stomach is within normal limits. Appendix appears normal. No evidence of bowel wall thickening, distention, or inflammatory changes. No free air or pneumatosis. A few scattered diverticula are noted along the colon without evidence of diverticulitis. There is mild diffuse colonic wall thickening with enhancement.  Vascular/Lymphatic: Aortic atherosclerosis. No enlarged abdominal or pelvic lymph nodes. Reproductive: Status post hysterectomy. No adnexal masses. Other: Small fat containing periumbilical hernia. No abdominopelvic ascites. Musculoskeletal: Bilateral breast implants are noted. There are degenerative changes in the thoracolumbar spine. No acute osseous abnormality. IMPRESSION: 1. Mild diffuse colonic wall thickening and mucosal enhancement, possible infectious or inflammatory colitis. 2. Diverticulosis without diverticulitis. 3. Aortic atherosclerosis. Electronically Signed   By: Laura  Taylor M.D.   On: 03/01/2022 02:01   DG Abdomen 1 View  Result Date: 02/28/2022 CLINICAL DATA:  Vomiting and watery diarrhea. EXAM: ABDOMEN - 1 VIEW COMPARISON:  None Available. FINDINGS: The bowel gas pattern is normal. Radiopaque surgical clips are seen overlying the medial aspect of the left upper quadrant. No radio-opaque calculi or other significant radiographic abnormality are seen. IMPRESSION: Nonobstructive bowel gas pattern. Electronically Signed   By: Thaddeus  Houston M.D.   On: 02/28/2022 21:26    ASSESSMENT & PLAN:   #1 Stage III/IV follicular lymphoma - likely low grade but accurate grading difficult  11/02/2018 MRI lumbar spine wo contrast revealed "Extensive retroperitoneal lymphadenopathy highly worrisome for neoplastic process such as lymphoproliferative disease. Spondylosis worst at L4-5 where there is narrowing in the right subarticular recess predominantly due to a synovial cyst off the medial margin of the right facet with encroachment on the descending right L5 root. There is mild central canal narrowing overall at this Level."  12/04/2018 CT CAP revealed "Mild-to-moderate abdominal and pelvic lymphadenopathy and new 9 mm left superior mediastinal lymph node, highly suspicious for lymphoproliferative disorder. Stable 8 mm lingular pulmonary nodule, consistent with benign Etiology. Moderate hepatic  steatosis. 1.3 cm indeterminate low-attenuation lesion in the right   lobe. Recommend continued attention on follow-up CT."  12/18/2018 lymph node biopsy pathology revealing a follicular lymphoma- likely low grade but accurate grading not possible on limited sampling.  02/06/2019 PET/CT scan ([id number]) revealed "1. Bulky hypermetabolic lymphadenopathy in the abdomen and upper pelvis, as described. This is associated with mild hypermetabolic lymphadenopathy in the upper mediastinum and right axilla. Hypermetabolic lymphadenopathy represents a combination of Deauville 4 and Deauville 5 disease. 2. 8 mm nodule in the lingula without hypermetabolism. Attention on follow-up recommended. 3. Hepatic steatosis. 4.  Aortic Atherosclerois (ICD10-170.0)."  03/08/2019 Lymph Node Biopsy ([id number]) revealed "LYMPH NODE, NEEDLE CORE BIOPSY: - Follicular lymphoma (grade 1-2 of 3) with elevated proliferation rate."  2. Mild treatment related thrombocytopenia- resolved.   3.  H/o Melena.  2 episodes about a month ago.  Hemoglobin stable today. Previously had upper abdominal discomfort which has resolved. Has some chronic nausea due to achalasia.  4.  Chronic ALT elevation -likely from her fatty liver. Plan -Follow-up with PCP and GI for management of hepatic steatosis/steatohepatitis. -Avoid hepatotoxic medications. -Recommended optimal diet and exercise and to optimize control of diabetes with PCP.  5. DM2 -following with PCP  6.  Bilateral Extensive multifocal groundglass opacities with interstitial thickening./Interstitial lung disease following with Dr. Dewald from pulmonary. Currently on steroid taper and is off home oxygen.  Plan: -Suggested the patient to start Probiotics.  -Discussed lab results from 03/28/2022 with the patient. CBC shows WBC of 9.4 K, hemoglobin of 9.9 K, and platelets of 447 K. CMP stable.  -Patient has no clinical symptoms suggestive of progression of her follicular  lymphoma at this time. -Suggested RSV vaccine after infection has been cleared.  -she has PCP and GI followup to evaluation for inflammatory bowel disease.  FOLLOW-UP: RTC with Dr  with labs in 6 months  The total time spent in the appointment was 20 minutes* .  All of the patient's questions were answered with apparent satisfaction. The patient knows to call the clinic with any problems, questions or concerns.     MD MS AAHIVMS [facility] Hematology/Oncology Physician [facility]  .*Total Encounter Time as defined by the Centers for Medicare and Medicaid Services includes, in addition to the face-to-face time of a patient visit (documented in the note above) non-face-to-face time: obtaining and reviewing outside history, ordering and reviewing medications, tests or procedures, care coordination (communications with other health care professionals or caregivers) and documentation in the medical record.   I, Param Shah, am acting as a scribe for  , MD. .I have reviewed the above documentation for accuracy and completeness, and I agree with the above. . Kishore  MD  

## 2022-03-30 ENCOUNTER — Other Ambulatory Visit: Payer: Self-pay

## 2022-04-01 ENCOUNTER — Other Ambulatory Visit: Payer: Self-pay

## 2022-04-03 ENCOUNTER — Encounter: Payer: Self-pay | Admitting: Hematology

## 2022-04-12 ENCOUNTER — Other Ambulatory Visit: Payer: Self-pay

## 2022-04-12 ENCOUNTER — Other Ambulatory Visit (HOSPITAL_COMMUNITY): Payer: Self-pay

## 2022-04-15 ENCOUNTER — Other Ambulatory Visit (HOSPITAL_COMMUNITY): Payer: Self-pay

## 2022-04-19 ENCOUNTER — Other Ambulatory Visit: Payer: Self-pay | Admitting: Student

## 2022-04-19 ENCOUNTER — Other Ambulatory Visit (HOSPITAL_COMMUNITY): Payer: Self-pay

## 2022-04-19 ENCOUNTER — Other Ambulatory Visit: Payer: Self-pay

## 2022-04-20 ENCOUNTER — Other Ambulatory Visit (HOSPITAL_COMMUNITY): Payer: Self-pay

## 2022-04-21 NOTE — Telephone Encounter (Signed)
Refill request for budesonide, patient will need to see PCP or GI before further budesonide refills.

## 2022-07-28 ENCOUNTER — Other Ambulatory Visit: Payer: Self-pay

## 2022-07-28 ENCOUNTER — Encounter (HOSPITAL_COMMUNITY): Payer: Self-pay

## 2022-07-28 ENCOUNTER — Inpatient Hospital Stay (HOSPITAL_COMMUNITY)
Admission: AD | Admit: 2022-07-28 | Discharge: 2022-07-31 | DRG: 386 | Disposition: A | Discharge status: Home / Self Care | Payer: Medicare Other | Source: Ambulatory Visit | Attending: Family Medicine | Admitting: Family Medicine

## 2022-07-28 DIAGNOSIS — Z7951 Long term (current) use of inhaled steroids: Secondary | ICD-10-CM

## 2022-07-28 DIAGNOSIS — Z91040 Latex allergy status: Secondary | ICD-10-CM

## 2022-07-28 DIAGNOSIS — Z888 Allergy status to other drugs, medicaments and biological substances status: Secondary | ICD-10-CM

## 2022-07-28 DIAGNOSIS — Z885 Allergy status to narcotic agent status: Secondary | ICD-10-CM

## 2022-07-28 DIAGNOSIS — K529 Noninfective gastroenteritis and colitis, unspecified: Secondary | ICD-10-CM | POA: Diagnosis present

## 2022-07-28 DIAGNOSIS — Z803 Family history of malignant neoplasm of breast: Secondary | ICD-10-CM

## 2022-07-28 DIAGNOSIS — J849 Interstitial pulmonary disease, unspecified: Secondary | ICD-10-CM | POA: Diagnosis present

## 2022-07-28 DIAGNOSIS — Z7984 Long term (current) use of oral hypoglycemic drugs: Secondary | ICD-10-CM

## 2022-07-28 DIAGNOSIS — K219 Gastro-esophageal reflux disease without esophagitis: Secondary | ICD-10-CM | POA: Diagnosis present

## 2022-07-28 DIAGNOSIS — Z801 Family history of malignant neoplasm of trachea, bronchus and lung: Secondary | ICD-10-CM

## 2022-07-28 DIAGNOSIS — Z791 Long term (current) use of non-steroidal anti-inflammatories (NSAID): Secondary | ICD-10-CM

## 2022-07-28 DIAGNOSIS — Z9221 Personal history of antineoplastic chemotherapy: Secondary | ICD-10-CM

## 2022-07-28 DIAGNOSIS — Z8572 Personal history of non-Hodgkin lymphomas: Secondary | ICD-10-CM

## 2022-07-28 DIAGNOSIS — K5010 Crohn's disease of large intestine without complications: Principal | ICD-10-CM | POA: Diagnosis present

## 2022-07-28 DIAGNOSIS — E876 Hypokalemia: Secondary | ICD-10-CM | POA: Diagnosis present

## 2022-07-28 DIAGNOSIS — Z79899 Other long term (current) drug therapy: Secondary | ICD-10-CM

## 2022-07-28 DIAGNOSIS — Z794 Long term (current) use of insulin: Secondary | ICD-10-CM

## 2022-07-28 DIAGNOSIS — I4891 Unspecified atrial fibrillation: Secondary | ICD-10-CM | POA: Diagnosis present

## 2022-07-28 DIAGNOSIS — I10 Essential (primary) hypertension: Secondary | ICD-10-CM | POA: Diagnosis present

## 2022-07-28 DIAGNOSIS — R197 Diarrhea, unspecified: Principal | ICD-10-CM | POA: Diagnosis present

## 2022-07-28 DIAGNOSIS — K220 Achalasia of cardia: Secondary | ICD-10-CM | POA: Diagnosis present

## 2022-07-28 DIAGNOSIS — E1165 Type 2 diabetes mellitus with hyperglycemia: Secondary | ICD-10-CM | POA: Diagnosis present

## 2022-07-28 LAB — GLUCOSE, CAPILLARY: Glucose-Capillary: 260 mg/dL — ABNORMAL HIGH (ref 70–99)

## 2022-07-28 LAB — COMPREHENSIVE METABOLIC PANEL
ALT: 39 U/L (ref 0–44)
AST: 35 U/L (ref 15–41)
Albumin: 3.7 g/dL (ref 3.5–5.0)
Alkaline Phosphatase: 85 U/L (ref 38–126)
Anion gap: 16 — ABNORMAL HIGH (ref 5–15)
BUN: 24 mg/dL — ABNORMAL HIGH (ref 8–23)
CO2: 26 mmol/L (ref 22–32)
Calcium: 8.5 mg/dL — ABNORMAL LOW (ref 8.9–10.3)
Chloride: 97 mmol/L — ABNORMAL LOW (ref 98–111)
Creatinine, Ser: 1.01 mg/dL — ABNORMAL HIGH (ref 0.44–1.00)
GFR, Estimated: >60 mL/min (ref >60)
Glucose, Bld: 252 mg/dL — ABNORMAL HIGH (ref 70–99)
Potassium: 2.8 mmol/L — ABNORMAL LOW (ref 3.5–5.1)
Sodium: 139 mmol/L (ref 135–145)
Total Bilirubin: 0.3 mg/dL (ref 0.3–1.2)
Total Protein: 5.9 g/dL — ABNORMAL LOW (ref 6.5–8.1)

## 2022-07-28 LAB — CBC
HCT: 38.7 % (ref 36.0–46.0)
Hemoglobin: 12.5 g/dL (ref 12.0–15.0)
MCH: 27 pg (ref 26.0–34.0)
MCHC: 32.3 g/dL (ref 30.0–36.0)
MCV: 83.6 fL (ref 80.0–100.0)
Platelets: 257 10*3/uL (ref 150–400)
RBC: 4.63 MIL/uL (ref 3.87–5.11)
RDW: 13.6 % (ref 11.5–15.5)
WBC: 10 10*3/uL (ref 4.0–10.5)
nRBC: 0 % (ref 0.0–0.2)

## 2022-07-28 LAB — MAGNESIUM: Magnesium: 1.6 mg/dL — ABNORMAL LOW (ref 1.7–2.4)

## 2022-07-28 MED ORDER — ENOXAPARIN SODIUM 40 MG/0.4ML IJ SOSY
40.0000 mg | PREFILLED_SYRINGE | INTRAMUSCULAR | Status: DC
  Administered 2022-07-28 – 2022-07-30 (×3): 40 mg via SUBCUTANEOUS
  Filled 2022-07-28 (×3): qty 0.4

## 2022-07-28 MED ORDER — POTASSIUM CHLORIDE IN NACL 20-0.9 MEQ/L-% IV SOLN
INTRAVENOUS | Status: DC
  Filled 2022-07-28 (×7): qty 1000

## 2022-07-28 MED ORDER — TRAZODONE HCL 50 MG PO TABS: 25.0000 mg | ORAL_TABLET | Freq: Every evening | ORAL | Status: DC | PRN

## 2022-07-28 MED ORDER — HYDRALAZINE HCL 20 MG/ML IJ SOLN: 10.0000 mg | Freq: Four times a day (QID) | INTRAMUSCULAR | Status: DC | PRN

## 2022-07-28 MED ORDER — ALBUTEROL SULFATE (2.5 MG/3ML) 0.083% IN NEBU: 2.5000 mg | INHALATION_SOLUTION | RESPIRATORY_TRACT | Status: DC | PRN

## 2022-07-28 MED ORDER — METOPROLOL SUCCINATE ER 25 MG PO TB24
25.0000 mg | ORAL_TABLET | Freq: Every day | ORAL | Status: DC
  Administered 2022-07-29 – 2022-07-31 (×3): 25 mg via ORAL
  Filled 2022-07-28 (×3): qty 1

## 2022-07-28 MED ORDER — INSULIN ASPART 100 UNIT/ML IJ SOLN
0.0000 [IU] | Freq: Every day | INTRAMUSCULAR | Status: DC
  Administered 2022-07-28: 3 [IU] via SUBCUTANEOUS
  Administered 2022-07-29: 5 [IU] via SUBCUTANEOUS
  Administered 2022-07-30: 2 [IU] via SUBCUTANEOUS

## 2022-07-28 MED ORDER — ONDANSETRON HCL 4 MG PO TABS: 4.0000 mg | ORAL_TABLET | Freq: Four times a day (QID) | ORAL | Status: DC | PRN

## 2022-07-28 MED ORDER — INSULIN ASPART 100 UNIT/ML IJ SOLN
0.0000 [IU] | Freq: Three times a day (TID) | INTRAMUSCULAR | Status: DC
  Administered 2022-07-29: 8 [IU] via SUBCUTANEOUS
  Administered 2022-07-29: 15 [IU] via SUBCUTANEOUS
  Administered 2022-07-29: 6 [IU] via SUBCUTANEOUS
  Administered 2022-07-30 (×2): 11 [IU] via SUBCUTANEOUS

## 2022-07-28 MED ORDER — ACETAMINOPHEN 325 MG PO TABS
650.0000 mg | ORAL_TABLET | Freq: Four times a day (QID) | ORAL | Status: DC | PRN
  Administered 2022-07-29: 650 mg via ORAL
  Filled 2022-07-28: qty 2

## 2022-07-28 MED ORDER — METHYLPREDNISOLONE SODIUM SUCC 40 MG IJ SOLR
40.0000 mg | Freq: Two times a day (BID) | INTRAMUSCULAR | Status: DC
  Administered 2022-07-28 – 2022-07-31 (×6): 40 mg via INTRAVENOUS
  Filled 2022-07-28 (×6): qty 1

## 2022-07-28 MED ORDER — ROSUVASTATIN CALCIUM 10 MG PO TABS
10.0000 mg | ORAL_TABLET | Freq: Every day | ORAL | Status: DC
  Administered 2022-07-29 – 2022-07-31 (×3): 10 mg via ORAL
  Filled 2022-07-28 (×3): qty 1

## 2022-07-28 MED ORDER — MOMETASONE FURO-FORMOTEROL FUM 200-5 MCG/ACT IN AERO: 2.0000 | INHALATION_SPRAY | Freq: Two times a day (BID) | RESPIRATORY_TRACT | Status: DC

## 2022-07-28 MED ORDER — ACETAMINOPHEN 650 MG RE SUPP: 650.0000 mg | Freq: Four times a day (QID) | RECTAL | Status: DC | PRN

## 2022-07-28 MED ORDER — DILTIAZEM HCL ER COATED BEADS 240 MG PO CP24
240.0000 mg | ORAL_CAPSULE | Freq: Every day | ORAL | Status: DC
  Administered 2022-07-29 – 2022-07-31 (×3): 240 mg via ORAL
  Filled 2022-07-28 (×3): qty 1

## 2022-07-28 MED ORDER — PANTOPRAZOLE SODIUM 40 MG PO TBEC
40.0000 mg | DELAYED_RELEASE_TABLET | Freq: Every day | ORAL | Status: DC
  Administered 2022-07-29 – 2022-07-31 (×3): 40 mg via ORAL
  Filled 2022-07-28 (×3): qty 1

## 2022-07-28 MED ORDER — POTASSIUM CHLORIDE CRYS ER 20 MEQ PO TBCR
40.0000 meq | EXTENDED_RELEASE_TABLET | ORAL | Status: AC
  Administered 2022-07-28 – 2022-07-29 (×2): 40 meq via ORAL
  Filled 2022-07-28: qty 4
  Filled 2022-07-28: qty 2

## 2022-07-28 MED ORDER — ONDANSETRON HCL 4 MG/2ML IJ SOLN: 4.0000 mg | Freq: Four times a day (QID) | INTRAMUSCULAR | Status: DC | PRN

## 2022-07-28 MED ORDER — SACCHAROMYCES BOULARDII 250 MG PO CAPS
250.0000 mg | ORAL_CAPSULE | Freq: Every day | ORAL | Status: DC
  Administered 2022-07-29 – 2022-07-31 (×3): 250 mg via ORAL
  Filled 2022-07-28 (×3): qty 1

## 2022-07-28 NOTE — H&P (Signed)
History and Physical  Meghan Alexander [id number] DOB: 06/08/1955 DOA: 07/28/2022  PCP: [name], MD   Chief Complaint: diarrhea   HPI: Meghan Alexander is a 67 y.o. female with medical history significant for atrial fibrillation, patient follicular lymphoma status postchemotherapy and Rituxan in remission patient insulin-dependent type 2 diabetes, hypertension, chronic diarrhea since October 2023 who was admitted to the hospitalist service at the request of her gastroenterologist Dr. Paulita Fujita.  Patient tells me she has had chronic watery diarrhea 20-30 times a day since October 2023, when she was first diagnosed with EHEC.  Since that time, she has had a complex course of multiple infections and courses of antibiotics, managed by gastroenterology and infectious disease.  She was most recently hospitalized in November 2023, at which time she was diagnosed with inflammatory versus infectious colitis and was discharged home on budesonide and a short course of oral azithromycin.  Patient tolerated since that, she has continued to have copious amounts of watery diarrhea, without blood, mucus, or other concerns.  Says that it purely just looks like water.  Denies any abdominal pain, nausea, vomiting, fevers, chills.  She was recently told by her gastroenterologist Dr. Paulita Fujita that she has inflammatory bowel disease, specifically Crohn's disease.  Due to her continued symptoms, she was admitted to the hospital for further workup and management.  Review of Systems: Please see HPI for pertinent positives and negatives. A complete 10 system review of systems are otherwise negative.  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 04/02/2019   GERD (gastroesophageal reflux disease)    hx of reflux-went away  with elevating HOB    Hypertension    Pneumonia    hx of    PONV (postoperative nausea and vomiting)    Type 2 diabetes mellitus 08/02/2021   Past Surgical History:  Procedure Laterality Date   ABDOMINAL HYSTERECTOMY     ADENOIDECTOMY     BIOPSY  03/10/2022   Procedure: BIOPSY;  Surgeon: Clarene Essex, MD;  Location: [place];  Service: Gastroenterology;;   BRONCHIAL BRUSHINGS  08/03/2021   Procedure: BRONCHIAL BRUSHINGS;  Surgeon: Collene Gobble, MD;  Location: [place];  Service: Cardiopulmonary;;   BRONCHIAL WASHINGS  08/03/2021   Procedure: BRONCHIAL WASHINGS;  Surgeon: Collene Gobble, MD;  Location: [place];  Service: Cardiopulmonary;;   cosmetic surgeries     ESOPHAGEAL MANOMETRY N/A 12/02/2015   Procedure: ESOPHAGEAL MANOMETRY (EM);  Surgeon: [name], MD;  Location: [facility];  Service: Endoscopy;  Laterality: N/A;   ESOPHAGOGASTRODUODENOSCOPY N/A 12/02/2015   Procedure: ESOPHAGOGASTRODUODENOSCOPY (EGD);  Surgeon: [name], MD;  Location: [facility];  Service: Endoscopy;  Laterality: N/A;   FLEXIBLE SIGMOIDOSCOPY N/A 03/10/2022   Procedure: FLEXIBLE SIGMOIDOSCOPY;  Surgeon: Clarene Essex, MD;  Location: [place];  Service: Gastroenterology;  Laterality: N/A;   lymph node removal left leg     cat scratch surgery    ORIF ANKLE FRACTURE Right 09/03/2021   Procedure: OPEN REDUCTION INTERNAL FIXATION (ORIF) ANKLE FRACTURE;  Surgeon: Willaim Sheng, MD;  Location: [place];  Service: Orthopedics;  Laterality: Right;   THROAT SURGERY     sleep apnea surgery    TONSILLECTOMY     VIDEO BRONCHOSCOPY  08/03/2021   Procedure: VIDEO BRONCHOSCOPY WITH FLUORO;  Surgeon: Collene Gobble, MD;  Location: [facility];  Service: Cardiopulmonary;;    Social History:  reports that she has never smoked. She has never used smokeless tobacco. She reports that she does not drink alcohol and does not use drugs.   Allergies  Allergen Reactions   Latex Other (See  Comments)    Blisters / skin peeling   Codeine Nausea And Vomiting and Other (See Comments)    hallucinations   Atenolol Cough   Other Other (See Comments)    DermaPlast; Used after surgery - caused swelling, itching, and wound broke open   Hydrocodone Nausea And Vomiting   Oxycodone Nausea And Vomiting    Family History  Problem Relation Age of Onset   Lung cancer Father        10-Aug-1980 died from it   Hernia Mother    Breast cancer Maternal Aunt        multiple    Breast cancer Maternal Grandmother    Breast cancer Paternal Grandmother    Breast cancer Cousin        cousins multiple    Colon cancer Neg Hx    Colon polyps Neg Hx    Rectal cancer Neg Hx    Stomach cancer Neg Hx      Prior to Admission medications   Medication Sig Start Date End Date Taking? Authorizing Provider  Accu-Chek Softclix Lancets lancets Use to test blood sugars up to 4 times daily as directed. 08/09/21   Allie Bossier, MD  Blood Glucose Monitoring Suppl (BLOOD GLUCOSE MONITOR SYSTEM) w/Device KIT Use to test blood sugars up to 4 times daily. 08/09/21   Allie Bossier, MD  diltiazem (CARDIZEM CD) 240 MG 24 hr capsule Take 1 capsule (240 mg total) by mouth daily. 10/11/21   Zola Button, MD  ferrous sulfate 325 (65 FE) MG tablet Take 1 tablet (325 mg total) by mouth every other day. Patient not taking: Reported on 03/07/2022 09/08/21 03/01/22  Gerrit Heck, MD  fluticasone-salmeterol (ADVAIR HFA) [phone] MCG/ACT inhaler Inhale 2 puffs into the lungs 2 (two) times daily. Patient taking differently: Inhale 2 puffs into the lungs daily. 02/14/22   Freddi Starr, MD  glucose blood (ACCU-CHEK GUIDE) test strip Use to test blood sugars up to 4 times daily as directed. 08/09/21   Allie Bossier, MD  HUMALOG KWIKPEN 100 UNIT/ML KwikPen Inject 5 Units into the skin with breakfast, with lunch, and with evening meal. Patient taking differently: Inject 15 Units into the skin 2 (two) times daily. 10/11/21   Zola Button, MD  insulin detemir (LEVEMIR) 100 UNIT/ML FlexPen Inject 10 Units into the skin daily. 10/11/21   Zola Button, MD  Insulin Pen Needle 32G X 4 MM MISC Use to inject insulin up to 4 times daily as directed. 08/09/21   Allie Bossier, MD  loperamide (IMODIUM) 2 MG capsule Take 1 capsule (2 mg total) by mouth as needed for diarrhea or loose stools. Not to exceed 8 capsules per day 03/22/22   Angelique Blonder, DO  losartan (COZAAR) 50 MG tablet Take 25 mg by mouth daily.    [provider]  meloxicam (MOBIC) 15 MG tablet Take 15 mg by mouth daily. 02/16/22   [provider]  metFORMIN (GLUCOPHAGE) 500 MG tablet Take 500 mg by mouth daily with breakfast. 07/23/19   [provider]  metoprolol succinate (TOPROL-XL) 25 MG 24 hr tablet Take 1 tablet (25 mg total) by mouth daily. 10/12/21  Zola Button, MD  Multiple Vitamin (MULTIVITAMIN) tablet Take 1 tablet by mouth daily.    [provider]  omeprazole (PRILOSEC) 20 MG capsule Take 20 mg by mouth daily. 11/28/20   [provider]  rosuvastatin (CRESTOR) 10 MG tablet Take 10 mg by mouth daily.    [provider]  Saccharomyces boulardii 500 MG PACK Take 500 mg by mouth 2 (two) times daily. 03/28/22   Brunetta Genera, MD  venlafaxine XR (EFFEXOR-XR) 37.5 MG 24 hr capsule Take 37.5 mg by mouth daily with breakfast.    [provider]    Physical Exam: BP (!) 159/87 (BP Location: Left Arm)   Pulse (!) 101   Temp 98.1 F (36.7 C) (Oral)   Resp 16   Ht 5\' 2"  (1.575 m)   Wt 74.8 kg   BMI 30.18 kg/m   General:  Alert, oriented, calm, in no acute distress  Eyes: EOMI, clear conjuctivae, white sclerea Neck: supple, no masses, trachea mildline  Cardiovascular: RRR, no murmurs or rubs, no peripheral edema  Respiratory: clear to auscultation bilaterally, no wheezes, no crackles  Abdomen: soft, nontender, nondistended, normal bowel tones heard  Skin: dry, no rashes  Musculoskeletal:  no joint effusions, normal range of motion  Psychiatric: appropriate affect, normal speech  Neurologic: extraocular muscles intact, clear speech, moving all extremities with intact sensorium          Labs on Admission:  Basic Metabolic Panel: No results for input(s): "NA", "K", "CL", "CO2", "GLUCOSE", "BUN", "CREATININE", "CALCIUM", "MG", "PHOS" in the last 168 hours. Liver Function Tests: No results for input(s): "AST", "ALT", "ALKPHOS", "BILITOT", "PROT", "ALBUMIN" in the last 168 hours. No results for input(s): "LIPASE", "AMYLASE" in the last 168 hours. No results for input(s): "AMMONIA" in the last 168 hours. CBC: No results for input(s): "WBC", "NEUTROABS", "HGB", "HCT", "MCV", "PLT" in the last 168 hours. Cardiac Enzymes: No results for input(s): "CKTOTAL", "CKMB", "CKMBINDEX", "TROPONINI" in the last 168 hours.  BNP (last 3 results) Recent Labs    08/02/21 1538  BNP 26.1    ProBNP (last 3 results) No results for input(s): "PROBNP" in the last 8760 hours.  CBG: No results for input(s): "GLUCAP" in the last 168 hours.  Radiological Exams on Admission: No results found.  Assessment/Plan This is a very pleasant 67 year old female with a history of insulin-dependent type 2 diabetes, atrial fibrillation not on anticoagulation, chronic diarrhea and colitis of unclear etiology being admitted to the hospital for workup and management of her continued diarrhea.  -Observation admission -Diabetic diet -Check stool studies -Discussed with her gastroenterologist Dr. Paulita Fujita, recommends initiation of IV steroids.   Achalasia  Uncontrolled type 2 diabetes mellitus with hyperglycemia-diabetic diet when eating, resume basal bolus insulin dosing with sliding scale.   HTN (hypertension)   Hypokalemia-likely due to GI losses, replete orally, placed on potassium containing fluids, and recheck potassium level in the morning.   ILD (interstitial lung disease)   DVT prophylaxis: Lovenox      Code Status: Full Code  Consults called: GI Dr. Paulita Fujita  Admission status: Observation  Time spent: 46 minutes  Zitlali Primm Neva Seat MD [facility] Pager [phone]  If 7PM-7AM, please contact night-coverage [email or web address] Password [facility]  07/28/2022, 6:34 PM

## 2022-07-29 DIAGNOSIS — Z7951 Long term (current) use of inhaled steroids: Secondary | ICD-10-CM | POA: Diagnosis not present

## 2022-07-29 DIAGNOSIS — Z791 Long term (current) use of non-steroidal anti-inflammatories (NSAID): Secondary | ICD-10-CM | POA: Diagnosis not present

## 2022-07-29 DIAGNOSIS — I4891 Unspecified atrial fibrillation: Secondary | ICD-10-CM | POA: Diagnosis present

## 2022-07-29 DIAGNOSIS — R197 Diarrhea, unspecified: Secondary | ICD-10-CM | POA: Diagnosis not present

## 2022-07-29 DIAGNOSIS — E1165 Type 2 diabetes mellitus with hyperglycemia: Secondary | ICD-10-CM | POA: Diagnosis present

## 2022-07-29 DIAGNOSIS — Z91040 Latex allergy status: Secondary | ICD-10-CM | POA: Diagnosis not present

## 2022-07-29 DIAGNOSIS — K220 Achalasia of cardia: Secondary | ICD-10-CM | POA: Diagnosis present

## 2022-07-29 DIAGNOSIS — Z803 Family history of malignant neoplasm of breast: Secondary | ICD-10-CM | POA: Diagnosis not present

## 2022-07-29 DIAGNOSIS — Z9221 Personal history of antineoplastic chemotherapy: Secondary | ICD-10-CM | POA: Diagnosis not present

## 2022-07-29 DIAGNOSIS — K219 Gastro-esophageal reflux disease without esophagitis: Secondary | ICD-10-CM | POA: Diagnosis present

## 2022-07-29 DIAGNOSIS — Z794 Long term (current) use of insulin: Secondary | ICD-10-CM | POA: Diagnosis not present

## 2022-07-29 DIAGNOSIS — E876 Hypokalemia: Secondary | ICD-10-CM | POA: Diagnosis present

## 2022-07-29 DIAGNOSIS — Z8572 Personal history of non-Hodgkin lymphomas: Secondary | ICD-10-CM | POA: Diagnosis not present

## 2022-07-29 DIAGNOSIS — K5010 Crohn's disease of large intestine without complications: Secondary | ICD-10-CM | POA: Diagnosis present

## 2022-07-29 DIAGNOSIS — Z801 Family history of malignant neoplasm of trachea, bronchus and lung: Secondary | ICD-10-CM | POA: Diagnosis not present

## 2022-07-29 DIAGNOSIS — I10 Essential (primary) hypertension: Secondary | ICD-10-CM | POA: Diagnosis present

## 2022-07-29 DIAGNOSIS — Z79899 Other long term (current) drug therapy: Secondary | ICD-10-CM | POA: Diagnosis not present

## 2022-07-29 DIAGNOSIS — J849 Interstitial pulmonary disease, unspecified: Secondary | ICD-10-CM

## 2022-07-29 DIAGNOSIS — Z888 Allergy status to other drugs, medicaments and biological substances status: Secondary | ICD-10-CM | POA: Diagnosis not present

## 2022-07-29 DIAGNOSIS — Z7984 Long term (current) use of oral hypoglycemic drugs: Secondary | ICD-10-CM | POA: Diagnosis not present

## 2022-07-29 DIAGNOSIS — Z885 Allergy status to narcotic agent status: Secondary | ICD-10-CM | POA: Diagnosis not present

## 2022-07-29 LAB — CBC
HCT: 37.8 % (ref 36.0–46.0)
Hemoglobin: 12.4 g/dL (ref 12.0–15.0)
MCH: 26.7 pg (ref 26.0–34.0)
MCHC: 32.8 g/dL (ref 30.0–36.0)
MCV: 81.5 fL (ref 80.0–100.0)
Platelets: 226 10*3/uL (ref 150–400)
RBC: 4.64 MIL/uL (ref 3.87–5.11)
RDW: 13.5 % (ref 11.5–15.5)
WBC: 8.5 10*3/uL (ref 4.0–10.5)
nRBC: 0 % (ref 0.0–0.2)

## 2022-07-29 LAB — GLUCOSE, CAPILLARY
Glucose-Capillary: 256 mg/dL — ABNORMAL HIGH (ref 70–99)
Glucose-Capillary: 274 mg/dL — ABNORMAL HIGH (ref 70–99)
Glucose-Capillary: 376 mg/dL — ABNORMAL HIGH (ref 70–99)
Glucose-Capillary: 231 mg/dL — ABNORMAL HIGH (ref 70–99)

## 2022-07-29 LAB — BASIC METABOLIC PANEL
Anion gap: 13 (ref 5–15)
BUN: 17 mg/dL (ref 8–23)
CO2: 29 mmol/L (ref 22–32)
Calcium: 8.1 mg/dL — ABNORMAL LOW (ref 8.9–10.3)
Chloride: 97 mmol/L — ABNORMAL LOW (ref 98–111)
Creatinine, Ser: 0.66 mg/dL (ref 0.44–1.00)
GFR, Estimated: >60 mL/min (ref >60)
Glucose, Bld: 267 mg/dL — ABNORMAL HIGH (ref 70–99)
Potassium: 3.2 mmol/L — ABNORMAL LOW (ref 3.5–5.1)
Sodium: 139 mmol/L (ref 135–145)

## 2022-07-29 MED ORDER — DIPHENHYDRAMINE HCL 25 MG PO CAPS
25.0000 mg | ORAL_CAPSULE | ORAL | Status: AC | PRN
  Administered 2022-07-29: 25 mg via ORAL
  Filled 2022-07-29: qty 1

## 2022-07-29 NOTE — Consult Note (Signed)
Referring Provider: Dr. Natale MilchLancaster Primary Care Physician:  Burton Apleyoberts, Ronald, MD Primary Gastroenterologist:  Dr. Dulce Sellarutlaw  Reason for Consultation:  Crohn's colitis; Diarrhea  HPI: Meghan RavelingCynthia Alexander is a 67 y.o. female with newly diagnosed Crohn's colitis (2/24) who has been having profuse nonbloody diarrhea up to 20 times per day with lower abdominal and rectal pain. Limited help with Budesonide and then changed to Prednisone 40 mg/day as an outpt but continued to have profuse diarrhea and was directly admitted to hospitalists after seen by Dr. Dulce Sellarutlaw yesterday. Denies rectal bleeding, N/V. C. Diff and GI pathogen panel negative in January 2023. Flex sig in 02/2022 with findings concerning for ischemic colitis vs infectious colitis (EPEC). History of follicular lymphoma in remission with previous treatment with Rituxan.  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 04/02/2019   GERD (gastroesophageal reflux disease)    hx of reflux-went away with elevating HOB    Hypertension    Pneumonia    hx of    PONV (postoperative nausea and vomiting)    Type 2 diabetes mellitus 08/02/2021    Past Surgical History:  Procedure Laterality Date   ABDOMINAL HYSTERECTOMY     ADENOIDECTOMY     BIOPSY  03/10/2022   Procedure: BIOPSY;  Surgeon: Vida RiggerMagod, Marc, MD;  [facility];  Service: Gastroenterology;;   BRONCHIAL BRUSHINGS  08/03/2021   Procedure: BRONCHIAL BRUSHINGS;  Surgeon: Leslye PeerByrum, Robert S, MD;  [facility];  Service: Cardiopulmonary;;   BRONCHIAL WASHINGS  08/03/2021   Procedure: BRONCHIAL WASHINGS;  Surgeon: Leslye PeerByrum, Robert S, MD;  [facility];  Service: Cardiopulmonary;;   cosmetic surgeries     ESOPHAGEAL MANOMETRY N/A 12/02/2015   Procedure: ESOPHAGEAL MANOMETRY (EM);   Surgeon: Willis ModenaWilliam Outlaw, MD;  Location: [facility];  Service: Endoscopy;  Laterality: N/A;   ESOPHAGOGASTRODUODENOSCOPY N/A 12/02/2015   Procedure: ESOPHAGOGASTRODUODENOSCOPY (EGD);  Surgeon: Willis ModenaWilliam Outlaw, MD;  [facility];  Service: Endoscopy;  Laterality: N/A;   FLEXIBLE SIGMOIDOSCOPY N/A 03/10/2022   Procedure: FLEXIBLE SIGMOIDOSCOPY;  Surgeon: Vida RiggerMagod, Marc, MD;  [facility];  Service: Gastroenterology;  Laterality: N/A;   lymph node removal left leg     cat scratch surgery    ORIF ANKLE FRACTURE Right 09/03/2021   Procedure: OPEN REDUCTION INTERNAL FIXATION (ORIF) ANKLE FRACTURE;  Surgeon: Joen LauraMarchwiany, Daniel A, MD;  [facility];  Service: Orthopedics;  Laterality: Right;   THROAT SURGERY     sleep apnea surgery    TONSILLECTOMY     VIDEO BRONCHOSCOPY  08/03/2021   Procedure: VIDEO BRONCHOSCOPY WITH FLUORO;  Surgeon: Leslye PeerByrum, Robert S, MD;  [facility];  Service: Cardiopulmonary;;    Prior to Admission medications   Medication Sig Start Date End Date Taking? Authorizing Provider  BENADRYL ALLERGY 25 MG tablet Take 25 mg by mouth at bedtime.   Yes [provider]  Continuous Blood Gluc Sensor (FREESTYLE LIBRE 2 SENSOR) MISC Inject 1 Device into the skin every 14 (fourteen) days.   Yes [provider]  diltiazem (CARDIZEM CD) 240 MG 24 hr capsule Take 1 capsule (240 mg total) by mouth daily. 10/11/21  Yes Littie DeedsSun, Richard, MD  ferrous sulfate 325 (65 FE) MG tablet Take 1 tablet (325 mg total) by mouth every other day. Patient  taking differently: Take 325 mg by mouth daily with breakfast. 09/08/21 07/28/22 Yes Levin ErpJagadish, Mayuri, MD  fluticasone (FLONASE) 50 MCG/ACT nasal spray Place 2 sprays into both nostrils in the morning.   Yes [provider]  HUMALOG KWIKPEN 100 UNIT/ML KwikPen Inject 5 Units into the skin with breakfast, with lunch, and with evening meal. Patient taking differently: Inject 15 Units into the skin See admin  instructions. Inject 15 units into the skin before breakfast and an additional 15 units once a day as needed for elevated BGL 10/11/21  Yes Littie DeedsSun, Richard, MD  insulin detemir (LEVEMIR) 100 UNIT/ML FlexPen Inject 10 Units into the skin daily. Patient taking differently: Inject 20 Units into the skin See admin instructions. Inject 20 units into the skin in the morning as needed for elevated BGL 10/11/21  Yes Littie DeedsSun, Richard, MD  loperamide (IMODIUM A-D) 2 MG tablet Take 2 mg by mouth 4 (four) times daily as needed for diarrhea or loose stools.   Yes [provider]  loperamide (IMODIUM) 2 MG capsule Take 1 capsule (2 mg total) by mouth as needed for diarrhea or loose stools. Not to exceed 8 capsules per day Patient taking differently: Take 2 mg by mouth in the morning. 03/22/22  Yes Rana SnareZheng, Michael, DO  losartan (COZAAR) 50 MG tablet Take 25 mg by mouth in the morning.   Yes [provider]  Melatonin 10 MG TABS Take 10 mg by mouth at bedtime.   Yes [provider]  meloxicam (MOBIC) 15 MG tablet Take 15 mg by mouth daily as needed for pain. 02/16/22  Yes [provider]  metFORMIN (GLUCOPHAGE) 500 MG tablet Take 500 mg by mouth daily with breakfast. 07/23/19  Yes [provider]  metoprolol succinate (TOPROL-XL) 25 MG 24 hr tablet Take 1 tablet (25 mg total) by mouth daily. 10/12/21  Yes Littie DeedsSun, Richard, MD  Multiple Vitamin (MULTIVITAMIN) tablet Take 1 tablet by mouth daily with breakfast.   Yes [provider]  omeprazole (PRILOSEC) 20 MG capsule Take 20 mg by mouth daily before breakfast. 11/28/20  Yes [provider]  predniSONE (DELTASONE) 10 MG tablet Take 40 mg by mouth daily with breakfast.   Yes [provider]  rosuvastatin (CRESTOR) 10 MG tablet Take 10 mg by mouth daily.   Yes [provider]  saccharomyces boulardii (FLORASTOR) 250 MG capsule Take 250 mg by mouth in the morning.   Yes [provider]  TURMERIC PO  Take 1 capsule by mouth at bedtime.   Yes [provider]  venlafaxine XR (EFFEXOR-XR) 37.5 MG 24 hr capsule Take 37.5 mg by mouth daily with breakfast.   Yes [provider]  Accu-Chek Softclix Lancets lancets Use to test blood sugars up to 4 times daily as directed. 08/09/21   Drema DallasWoods, Curtis J, MD  Blood Glucose Monitoring Suppl (BLOOD GLUCOSE MONITOR SYSTEM) w/Device KIT Use to test blood sugars up to 4 times daily. 08/09/21   Drema DallasWoods, Curtis J, MD  fluticasone-salmeterol (ADVAIR HFA) [phone] MCG/ACT inhaler Inhale 2 puffs into the lungs 2 (two) times daily. Patient not taking: Reported on 07/28/2022 02/14/22   Martina Sinnerewald, Jonathan B, MD  glucose blood (ACCU-CHEK GUIDE) test strip Use to test blood sugars up to 4 times daily as directed. 08/09/21   Drema DallasWoods, Curtis J, MD  Insulin Pen Needle 32G X 4 MM MISC Use to inject insulin up to 4 times daily as directed. 08/09/21   Drema DallasWoods, Curtis J, MD  Saccharomyces boulardii 500 MG  PACK Take 500 mg by mouth 2 (two) times daily. Patient not taking: Reported on 07/28/2022 03/28/22   Johney Maine, MD    Scheduled Meds:  diltiazem  240 mg Oral Daily   enoxaparin (LOVENOX) injection  40 mg Subcutaneous Q24H   insulin aspart  0-15 Units Subcutaneous TID WC   insulin aspart  0-5 Units Subcutaneous QHS   methylPREDNISolone (SOLU-MEDROL) injection  40 mg Intravenous Q12H   metoprolol succinate  25 mg Oral Daily   pantoprazole  40 mg Oral Daily   rosuvastatin  10 mg Oral Daily   saccharomyces boulardii  250 mg Oral Daily   Continuous Infusions:  0.9 % NaCl with KCl 20 mEq / L 100 mL/hr at 07/29/22 1509   PRN Meds:.acetaminophen **OR** acetaminophen, albuterol, hydrALAZINE, ondansetron **OR** ondansetron (ZOFRAN) IV, traZODone  Allergies as of 07/28/2022 - Review Complete 07/28/2022  Allergen Reaction Noted   Latex Other (See Comments) 10/17/2014   Codeine Nausea And Vomiting and Other (See Comments) 08/16/2011   Atenolol Cough 07/08/2020    Other Itching, Swelling, and Other (See Comments) 12/14/2018   Hydrocodone Nausea And Vomiting 08/16/2011   Oxycodone Nausea And Vomiting 08/16/2011    Family History  Problem Relation Age of Onset   Lung cancer Father        1982 died from it   Hernia Mother    Breast cancer Maternal Aunt        multiple    Breast cancer Maternal Grandmother    Breast cancer Paternal Grandmother    Breast cancer Cousin        cousins multiple    Colon cancer Neg Hx    Colon polyps Neg Hx    Rectal cancer Neg Hx    Stomach cancer Neg Hx     Social History   Socioeconomic History   Marital status: Married    Spouse name: Not on file   Number of children: Not on file   Years of education: Not on file   Highest education level: Not on file  Occupational History   Not on file  Tobacco Use   Smoking status: Never   Smokeless tobacco: Never  Vaping Use   Vaping Use: Never used  Substance and Sexual Activity   Alcohol use: No    Alcohol/week: 0.0 standard drinks of alcohol   Drug use: No   Sexual activity: Not on file  Other Topics Concern   Not on file  Social History Narrative   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, newspaper at [employer]   Never smoker never drinker   No drugs   Multiple allergies   Lives at home with her husband of 8 years since 2008   Social Determinants of Health   Financial Resource Strain: Not on file  Food Insecurity: No Food Insecurity (07/28/2022)   Hunger Vital Sign    Worried About Running Out of Food in the Last Year: Never true    Ran Out of Food in the Last Year: Never true  Transportation Needs: No Transportation Needs (07/28/2022)   PRAPARE - Administrator, Civil Service (Medical): No    Lack of Transportation (Non-Medical): No  Physical Activity: Not on file  Stress: Not on file  Social Connections: Not on file  Intimate Partner Violence: Not At Risk (07/28/2022)   Humiliation, Afraid, Rape, and Kick questionnaire     Fear of Current or Ex-Partner: No    Emotionally Abused: No  Physically Abused: No    Sexually Abused: No    Review of Systems: All negative except as stated above in HPI.  Physical Exam: Vital signs: Vitals:   07/29/22 1000 07/29/22 1320  BP: (!) 148/76 (!) 144/83  Pulse: 100 (!) 101  Resp: 18 16  Temp:  98 F (36.7 C)  SpO2:  95%   Last BM Date : 07/29/22 General:   Alert,  Well-developed, well-nourished, pleasant and cooperative in NAD Head: normocephalic, atraumatic Eyes: anicteric sclera ENT: oropharynx clear Neck: supple, nontender Lungs:  Clear throughout to auscultation.   No wheezes, crackles, or rhonchi. No acute distress. Heart:  Regular rate and rhythm; no murmurs, clicks, rubs,  or gallops. Abdomen: lower abdominal tenderness with guarding (RLQ), soft, nondistended, +BS  Rectal:  Deferred Ext: no edema  GI:  Lab Results: Recent Labs    07/28/22 1900 07/29/22 0525  WBC 10.0 8.5  HGB 12.5 12.4  HCT 38.7 37.8  PLT 257 226   BMET Recent Labs    07/28/22 1900 07/29/22 0525  NA 139 139  K 2.8* 3.2*  CL 97* 97*  CO2 26 29  GLUCOSE 252* 267*  BUN 24* 17  CREATININE 1.01* 0.66  CALCIUM 8.5* 8.1*   LFT Recent Labs    07/28/22 1900  PROT 5.9*  ALBUMIN 3.7  AST 35  ALT 39  ALKPHOS 85  BILITOT 0.3   PT/INR No results for input(s): "LABPROT", "INR" in the last 72 hours.   Studies/Results: No results found.  Impression/Plan: Crohn's colitis with failure to improve on Prednisone as outpt. Updated stool studies needed with C. Diff and GI pathogen panel and if unrevealing, then may need biologic induction in the near future. Supportive care. Clear liquid diet. I do not think a repeat flex sig/colon is needed at this time. Will follow up.    LOS: 1 day   [name]  07/29/2022, 3:38 PM  Questions please call [phone]

## 2022-07-29 NOTE — Progress Notes (Signed)
Mobility Specialist - Progress Note   07/29/22 1102  Mobility  Activity Ambulated with assistance in hallway  Level of Assistance Independent after set-up  Assistive Device Other (Comment) (IV Pole)  Distance Ambulated (ft) 250 ft  Activity Response Tolerated well  Mobility Referral Yes  $Mobility charge 1 Mobility   Pt received in bed and agreeable to mobility. No complaints during session. Pt to bed after session with all needs met.    [facility]

## 2022-07-29 NOTE — Progress Notes (Signed)
PROGRESS NOTE    Meghan Alexander  PYP:[id number] DOB: [birth year] DOA: 07/28/2022 PCP: Burton Apley, MD   Brief Narrative:  Meghan Alexander is a 67 y.o. female with medical history significant for atrial fibrillation, patient follicular lymphoma status postchemotherapy and Rituxan in remission patient insulin-dependent type 2 diabetes, hypertension, chronic diarrhea since October 2023 who was admitted to the hospitalist service at the request of her gastroenterologist Dr. Dulce Sellar.   Assessment & Plan:   Active Problems:   Achalasia   Uncontrolled type 2 diabetes mellitus with hyperglycemia   HTN (hypertension)   Hypokalemia   ILD (interstitial lung disease)   Chronic diarrhea of unknown origin   Chronic diarrhea   Chronic intractable diffuse watery diarrhea, with questionable acute episode/exacerbation -Followed in the outpatient setting by GI, reports loose to watery stool since November of last year some 4 months ago. -Will repeat stool testing here to ensure no infectious process still less likely given chronicity of her symptoms -Patient reports recent colonoscopy notable for hemorrhoids and atypical mucosa with multiple biopsies taken -concerning for low-grade lymphoma currently being followed by Dr. Candise Che -Previous enteropathogenic E. coli infection from foodborne illness last November appears to be near the onset of her chronic symptoms, unclear if related however. -Electrolyte abnormalities consistent with diffuse diarrhea, replete potassium and magnesium as appropriate -Continue diabetic diet, defer to GI for further needs for n.p.o. or specialized diet -Continue IV steroids per previous recommendations  Achalasia -Chronic, continue supportive care  Uncontrolled type 2 diabetes mellitus with hyperglycemia -Continue diabetic diet, sliding scale insulin as appropriate, hypoglycemic protocol on board  HTN (hypertension) ILD (interstitial lung disease) -Follow  clinically  DVT prophylaxis: Lovenox Code Status: Full Family Communication: None present  Status is: Inpatient  Dispo: The patient is from: Home              Anticipated d/c is to: Home              Anticipated d/c date is: 24 to 48 hours              Patient currently not medically stable for discharge given ongoing diffuse watery diarrhea, upwards of 14 bowel movements over the past 12 hours  Consultants:  GI  Procedures:  Pending above  Antimicrobials:  Not currently indicated  Subjective: Patient reports 14 watery bowel movements over the past 12 hours since admission.  She denies any bloody or dark stool.  Otherwise denies nausea vomiting headache fevers chills chest pain shortness of breath.  Objective: Vitals:   07/28/22 1728 07/28/22 2054 07/28/22 2300 07/29/22 0509  BP:  (!) 149/77 (!) 147/87 (!) 154/90  Pulse:  96 (!) 104 (!) 103  Resp:  18 20 20   Temp:  98 F (36.7 C) (!) 97.4 F (36.3 C) 98.1 F (36.7 C)  TempSrc:  Oral Oral Oral  SpO2:  96% 96% 98%  Weight: 74.8 kg     Height: 5\' 2"  (1.575 m)       Intake/Output Summary (Last 24 hours) at 07/29/2022 0749 Last data filed at 07/28/2022 2300 Gross per 24 hour  Intake 299.67 ml  Output --  Net 299.67 ml   Filed Weights   07/28/22 1728  Weight: 74.8 kg    Examination:  General exam: Appears calm and comfortable  Respiratory system: Clear to auscultation. Respiratory effort normal. Cardiovascular system: S1 & S2 heard, RRR. No JVD, murmurs, rubs, gallops or clicks. No pedal edema. Gastrointestinal system: Abdomen is nondistended, soft and nontender. No organomegaly  or masses felt. Normal bowel sounds heard. Central nervous system: Alert and oriented. No focal neurological deficits. Extremities: Symmetric 5 x 5 power. Skin: No rashes, lesions or ulcers Psychiatry: Judgement and insight appear normal. Mood & affect appropriate.     Data Reviewed: I have personally reviewed following labs and imaging  studies  CBC: Recent Labs  Lab 07/28/22 1900 07/29/22 0525  WBC 10.0 8.5  HGB 12.5 12.4  HCT 38.7 37.8  MCV 83.6 81.5  PLT 257 226   Basic Metabolic Panel: Recent Labs  Lab 07/28/22 1900 07/28/22 2252 07/29/22 0525  NA 139  --  139  K 2.8*  --  3.2*  CL 97*  --  97*  CO2 26  --  29  GLUCOSE 252*  --  267*  BUN 24*  --  17  CREATININE 1.01*  --  0.66  CALCIUM 8.5*  --  8.1*  MG  --  1.6*  --    GFR: Estimated Creatinine Clearance: 62.9 mL/min (by C-G formula based on SCr of 0.66 mg/dL).  Liver Function Tests: Recent Labs  Lab 07/28/22 1900  AST 35  ALT 39  ALKPHOS 85  BILITOT 0.3  PROT 5.9*  ALBUMIN 3.7   CBG: Recent Labs  Lab 07/28/22 2102 07/29/22 0732  GLUCAP 260* 256*   No results found for this or any previous visit (from the past 240 hour(s)).    Radiology Studies: No results found.  Scheduled Meds:  diltiazem  240 mg Oral Daily   enoxaparin (LOVENOX) injection  40 mg Subcutaneous Q24H   insulin aspart  0-15 Units Subcutaneous TID WC   insulin aspart  0-5 Units Subcutaneous QHS   methylPREDNISolone (SOLU-MEDROL) injection  40 mg Intravenous Q12H   metoprolol succinate  25 mg Oral Daily   pantoprazole  40 mg Oral Daily   rosuvastatin  10 mg Oral Daily   saccharomyces boulardii  250 mg Oral Daily   Continuous Infusions:  0.9 % NaCl with KCl 20 mEq / L 100 mL/hr at 07/29/22 0444     LOS: 1 day   Time spent: 45min  Azucena FallenWilliam C Zay Yeargan, DO [facility]  If 7PM-7AM, please contact night-coverage [email or web address]  07/29/2022, 7:49 AM

## 2022-07-29 NOTE — Progress Notes (Signed)
  Transition of Care [facility]) Screening Note   Patient Details  Name: Meghan Alexander Date of Birth: Apr 24, 1956   Transition of Care [facility]) CM/SW Contact:    Otelia Santee, LCSW Phone Number: 07/29/2022, 12:48 PM    Transition of Care Department [facility]) has reviewed patient and no TOC needs have been identified at this time. We will continue to monitor patient advancement through interdisciplinary progression rounds. If new patient transition needs arise, please place a TOC consult.

## 2022-07-29 NOTE — Plan of Care (Signed)

## 2022-07-30 LAB — COMPREHENSIVE METABOLIC PANEL
ALT: 45 U/L — ABNORMAL HIGH (ref 0–44)
AST: 52 U/L — ABNORMAL HIGH (ref 15–41)
Albumin: 3.3 g/dL — ABNORMAL LOW (ref 3.5–5.0)
Alkaline Phosphatase: 72 U/L (ref 38–126)
Anion gap: 9 (ref 5–15)
BUN: 12 mg/dL (ref 8–23)
CO2: 28 mmol/L (ref 22–32)
Calcium: 7.9 mg/dL — ABNORMAL LOW (ref 8.9–10.3)
Chloride: 101 mmol/L (ref 98–111)
Creatinine, Ser: 0.69 mg/dL (ref 0.44–1.00)
GFR, Estimated: >60 mL/min (ref >60)
Glucose, Bld: 256 mg/dL — ABNORMAL HIGH (ref 70–99)
Potassium: 4.1 mmol/L (ref 3.5–5.1)
Sodium: 138 mmol/L (ref 135–145)
Total Bilirubin: 0.8 mg/dL (ref 0.3–1.2)
Total Protein: 5.6 g/dL — ABNORMAL LOW (ref 6.5–8.1)

## 2022-07-30 LAB — C DIFFICILE QUICK SCREEN W PCR REFLEX
C Diff antigen: NEGATIVE
C Diff interpretation: NOT DETECTED
C Diff toxin: NEGATIVE

## 2022-07-30 LAB — GLUCOSE, CAPILLARY
Glucose-Capillary: 301 mg/dL — ABNORMAL HIGH (ref 70–99)
Glucose-Capillary: 306 mg/dL — ABNORMAL HIGH (ref 70–99)
Glucose-Capillary: 206 mg/dL — ABNORMAL HIGH (ref 70–99)
Glucose-Capillary: 299 mg/dL — ABNORMAL HIGH (ref 70–99)
Glucose-Capillary: 248 mg/dL — ABNORMAL HIGH (ref 70–99)

## 2022-07-30 LAB — GASTROINTESTINAL PANEL BY PCR, STOOL (REPLACES STOOL CULTURE)

## 2022-07-30 LAB — CBC
HCT: 37 % (ref 36.0–46.0)
Hemoglobin: 12.1 g/dL (ref 12.0–15.0)
MCH: 27.1 pg (ref 26.0–34.0)
MCHC: 32.7 g/dL (ref 30.0–36.0)
MCV: 82.8 fL (ref 80.0–100.0)
Platelets: 217 10*3/uL (ref 150–400)
RBC: 4.47 MIL/uL (ref 3.87–5.11)
RDW: 13.6 % (ref 11.5–15.5)
WBC: 9.4 10*3/uL (ref 4.0–10.5)
nRBC: 0 % (ref 0.0–0.2)

## 2022-07-30 MED ORDER — INSULIN ASPART 100 UNIT/ML IJ SOLN
0.0000 [IU] | Freq: Three times a day (TID) | INTRAMUSCULAR | Status: DC
  Administered 2022-07-30: 11 [IU] via SUBCUTANEOUS
  Administered 2022-07-31 (×2): 15 [IU] via SUBCUTANEOUS

## 2022-07-30 MED ORDER — INSULIN ASPART 100 UNIT/ML IJ SOLN
15.0000 [IU] | Freq: Once | INTRAMUSCULAR | Status: AC
  Administered 2022-07-30: 15 [IU] via SUBCUTANEOUS

## 2022-07-30 NOTE — Plan of Care (Signed)

## 2022-07-30 NOTE — Progress Notes (Signed)
[facility] Progress Note  Meghan Alexander 67 y.o. 11-22-1955   Subjective: Since admit had only 2 stools yesterday that were like "pudding." Reports 4 stools this morning. Flushing before staff sees. Denies abdominal pain.  Objective: Vital signs: Vitals:   07/30/22 0520 07/30/22 0851  BP: (!) 164/86 (!) 151/87  Pulse: 73 77  Resp: 17   Temp: 98.4 F (36.9 C)   SpO2: 98%     Physical Exam: Gen: alert, no acute distress, well-nourished, pleasant HEENT: anicteric sclera CV: RRR Chest: CTA B Abd: RLQ, RUQ, and epigastric tenderness with minimal guarding, soft, nondistended, +BS Ext: no edema  Lab Results: Recent Labs    07/28/22 2252 07/29/22 0525 07/30/22 0617  NA  --  139 138  K  --  3.2* 4.1  CL  --  97* 101  CO2  --  29 28  GLUCOSE  --  267* 256*  BUN  --  17 12  CREATININE  --  0.66 0.69  CALCIUM  --  8.1* 7.9*  MG 1.6*  --   --    Recent Labs    07/28/22 1900 07/30/22 0617  AST 35 52*  ALT 39 45*  ALKPHOS 85 72  BILITOT 0.3 0.8  PROT 5.9* 5.6*  ALBUMIN 3.7 3.3*   Recent Labs    07/29/22 0525 07/30/22 0617  WBC 8.5 9.4  HGB 12.4 12.1  HCT 37.8 37.0  MCV 81.5 82.8  PLT 226 217      Assessment/Plan: Crohn's colitis with failed outpt management with Prednisone. C. Diff neg, GI pathogen panel pending. Advised to let staff see BMs for documentation. Continue IV steroids. Soft food. If diarrhea has improved then consider d/c tomorrow. Outpt discussion with Dr. Dulce Alexander on whether biologics will be needed in the near future.   Meghan Alexander 07/30/2022, 10:44 AM  Questions please call [phone] ID: Meghan Alexander, female   DOB: Oct 25, 1955, 67 y.o.   MRN: [id number]

## 2022-07-30 NOTE — Progress Notes (Addendum)
PROGRESS NOTE    Meghan RavelingCynthia Alexander  ZOX:[id number] DOB: 09/14/1955 DOA: 07/28/2022 PCP: Burton Apleyoberts, Ronald, MD   Brief Narrative:  Meghan RavelingCynthia Alexander is a 67 y.o. female with medical history significant for atrial fibrillation, patient follicular lymphoma status postchemotherapy and Rituxan in remission patient insulin-dependent type 2 diabetes, hypertension, chronic diarrhea since October 2023 who was admitted to the hospitalist service at the request of her gastroenterologist Dr. Dulce Sellarutlaw.   Assessment & Plan:   Principal Problem:   Diarrhea Active Problems:   Achalasia   Uncontrolled type 2 diabetes mellitus with hyperglycemia   HTN (hypertension)   Hypokalemia   ILD (interstitial lung disease)   Chronic diarrhea of unknown origin   Chronic diarrhea   Chronic intractable diffuse watery diarrhea, with questionable acute episode/exacerbation -Followed in the outpatient setting by GI, reports loose to watery stool since November of last year some 4 months ago. -GI following along in consult, recommend transitioning patient to Biologics if stool culture/panel returned negative and patient has not improved with high-dose steroids -Will repeat stool testing here to ensure no infectious process still less likely given chronicity of her symptoms -Patient reports recent colonoscopy notable for hemorrhoids and atypical mucosa with multiple biopsies taken -concerning for low-grade lymphoma currently being followed by Dr. Candise CheKale -Previous enteropathogenic E. coli infection from foodborne illness last November appears to be near the onset of her chronic symptoms, unclear if related however. -Electrolyte abnormalities consistent with diffuse diarrhea, replete potassium and magnesium as appropriate -Continue diabetic diet, defer to GI for further needs for n.p.o. or specialized diet -Continue IV steroids per previous GI recommendations **Evening update GI PANEL positive for Norovirus - unclear if this is a  prolonged chronic infection or a recent re-infection. Continue supportive care - unclear if antivirals would be indicated in her situation.  Achalasia -Chronic, continue supportive care  Uncontrolled type 2 diabetes mellitus with hyperglycemia -Continue diabetic diet, sliding scale insulin as appropriate, hypoglycemic protocol on board  HTN (hypertension) ILD (interstitial lung disease) -Follow clinically  DVT prophylaxis: Lovenox Code Status: Full Family Communication: None present  Status is: Inpatient  Dispo: The patient is from: Home              Anticipated d/c is to: Home              Anticipated d/c date is: 24 to 48 hours              Patient currently not medically stable for discharge given ongoing diffuse watery diarrhea, upwards of 14 bowel movements over the past 12 hours  Consultants:  GI  Procedures:  Pending above  Antimicrobials:  Not currently indicated  Subjective: Patient reports 14 watery bowel movements over the past 12 hours since admission.  She denies any bloody or dark stool.  Otherwise denies nausea vomiting headache fevers chills chest pain shortness of breath.  Objective: Vitals:   07/29/22 1000 07/29/22 1320 07/29/22 2050 07/30/22 0520  BP: (!) 148/76 (!) 144/83 (!) 147/83 (!) 164/86  Pulse: 100 (!) 101 81 73  Resp: 18 16 18 17   Temp:  98 F (36.7 C) 98.3 F (36.8 C) 98.4 F (36.9 C)  TempSrc:      SpO2:  95% 97% 98%  Weight:      Height:        Intake/Output Summary (Last 24 hours) at 07/30/2022 0812 Last data filed at 07/30/2022 0400 Gross per 24 hour  Intake 3108.08 ml  Output --  Net 3108.08 ml  Filed Weights   07/28/22 1728  Weight: 74.8 kg    Examination:  General exam: Appears calm and comfortable  Respiratory system: Clear to auscultation. Respiratory effort normal. Cardiovascular system: S1 & S2 heard, RRR. No JVD, murmurs, rubs, gallops or clicks. No pedal edema. Gastrointestinal system: Abdomen is  nondistended, soft and nontender. No organomegaly or masses felt. Normal bowel sounds heard. Central nervous system: Alert and oriented. No focal neurological deficits. Extremities: Symmetric 5 x 5 power. Skin: No rashes, lesions or ulcers Psychiatry: Judgement and insight appear normal. Mood & affect appropriate.     Data Reviewed: I have personally reviewed following labs and imaging studies  CBC: Recent Labs  Lab 07/28/22 1900 07/29/22 0525 07/30/22 0617  WBC 10.0 8.5 9.4  HGB 12.5 12.4 12.1  HCT 38.7 37.8 37.0  MCV 83.6 81.5 82.8  PLT 257 226 217    Basic Metabolic Panel: Recent Labs  Lab 07/28/22 1900 07/28/22 2252 07/29/22 0525 07/30/22 0617  NA 139  --  139 138  K 2.8*  --  3.2* 4.1  CL 97*  --  97* 101  CO2 26  --  29 28  GLUCOSE 252*  --  267* 256*  BUN 24*  --  17 12  CREATININE 1.01*  --  0.66 0.69  CALCIUM 8.5*  --  8.1* 7.9*  MG  --  1.6*  --   --     GFR: Estimated Creatinine Clearance: 62.9 mL/min (by C-G formula based on SCr of 0.69 mg/dL).  Liver Function Tests: Recent Labs  Lab 07/28/22 1900 07/30/22 0617  AST 35 52*  ALT 39 45*  ALKPHOS 85 72  BILITOT 0.3 0.8  PROT 5.9* 5.6*  ALBUMIN 3.7 3.3*    CBG: Recent Labs  Lab 07/28/22 2102 07/29/22 0732 07/29/22 1135 07/29/22 1631 07/29/22 2051  GLUCAP 260* 256* 274* 376* 231*    Recent Results (from the past 240 hour(s))  C Difficile Quick Screen w PCR reflex     Status: None   Collection Time: 07/29/22 12:32 AM   Specimen: STOOL  Result Value Ref Range Status   C Diff antigen NEGATIVE NEGATIVE Final   C Diff toxin NEGATIVE NEGATIVE Final   C Diff interpretation No C. difficile detected.  Final    Comment: Performed at [facility], [address] [place], [place] [address]      Radiology Studies: No results found.  Scheduled Meds:  diltiazem  240 mg Oral Daily   enoxaparin (LOVENOX) injection  40 mg Subcutaneous Q24H   insulin aspart  0-15 Units  Subcutaneous TID WC   insulin aspart  0-5 Units Subcutaneous QHS   methylPREDNISolone (SOLU-MEDROL) injection  40 mg Intravenous Q12H   metoprolol succinate  25 mg Oral Daily   pantoprazole  40 mg Oral Daily   rosuvastatin  10 mg Oral Daily   saccharomyces boulardii  250 mg Oral Daily   Continuous Infusions:  0.9 % NaCl with KCl 20 mEq / L 100 mL/hr at 07/29/22 1959     LOS: 2 days   Time spent:  Azucena Fallen, DO [facility]  If 7PM-7AM, please contact night-coverage [email or web address]  07/30/2022, 8:12 AM

## 2022-07-31 LAB — COMPREHENSIVE METABOLIC PANEL
ALT: 56 U/L — ABNORMAL HIGH (ref 0–44)
AST: 40 U/L (ref 15–41)
Albumin: 3.4 g/dL — ABNORMAL LOW (ref 3.5–5.0)
Alkaline Phosphatase: 78 U/L (ref 38–126)
Anion gap: 9 (ref 5–15)
BUN: 16 mg/dL (ref 8–23)
CO2: 26 mmol/L (ref 22–32)
Calcium: 8.3 mg/dL — ABNORMAL LOW (ref 8.9–10.3)
Chloride: 102 mmol/L (ref 98–111)
Creatinine, Ser: 0.71 mg/dL (ref 0.44–1.00)
GFR, Estimated: >60 mL/min (ref >60)
Glucose, Bld: 309 mg/dL — ABNORMAL HIGH (ref 70–99)
Potassium: 3.8 mmol/L (ref 3.5–5.1)
Sodium: 137 mmol/L (ref 135–145)
Total Bilirubin: 0.4 mg/dL (ref 0.3–1.2)
Total Protein: 5.5 g/dL — ABNORMAL LOW (ref 6.5–8.1)

## 2022-07-31 LAB — GLUCOSE, CAPILLARY
Glucose-Capillary: 312 mg/dL — ABNORMAL HIGH (ref 70–99)
Glucose-Capillary: 321 mg/dL — ABNORMAL HIGH (ref 70–99)

## 2022-07-31 LAB — CBC
HCT: 38.6 % (ref 36.0–46.0)
Hemoglobin: 12.4 g/dL (ref 12.0–15.0)
MCH: 27 pg (ref 26.0–34.0)
MCHC: 32.1 g/dL (ref 30.0–36.0)
MCV: 84.1 fL (ref 80.0–100.0)
Platelets: 193 10*3/uL (ref 150–400)
RBC: 4.59 MIL/uL (ref 3.87–5.11)
RDW: 13.4 % (ref 11.5–15.5)
WBC: 9.6 10*3/uL (ref 4.0–10.5)
nRBC: 0 % (ref 0.0–0.2)

## 2022-07-31 LAB — STOOL CULTURE REFLEX - RSASHR

## 2022-07-31 LAB — HEMOGLOBIN A1C
Hgb A1c MFr Bld: 7.9 % — ABNORMAL HIGH (ref 4.8–5.6)
Mean Plasma Glucose: 180.03 mg/dL

## 2022-07-31 MED ORDER — LOSARTAN POTASSIUM 50 MG PO TABS
25.0000 mg | ORAL_TABLET | Freq: Every day | ORAL | Status: DC
  Administered 2022-07-31: 25 mg via ORAL
  Filled 2022-07-31: qty 1

## 2022-07-31 MED ORDER — FERROUS SULFATE 325 (65 FE) MG PO TABS
325.0000 mg | ORAL_TABLET | ORAL | Status: DC
  Administered 2022-07-31: 325 mg via ORAL
  Filled 2022-07-31: qty 1

## 2022-07-31 MED ORDER — VENLAFAXINE HCL ER 37.5 MG PO CP24
37.5000 mg | ORAL_CAPSULE | Freq: Every day | ORAL | Status: DC
  Administered 2022-07-31: 37.5 mg via ORAL
  Filled 2022-07-31: qty 1

## 2022-07-31 MED ORDER — INSULIN GLARGINE-YFGN 100 UNIT/ML ~~LOC~~ SOLN
20.0000 [IU] | Freq: Every day | SUBCUTANEOUS | Status: DC
  Administered 2022-07-31: 20 [IU] via SUBCUTANEOUS
  Filled 2022-07-31: qty 0.2

## 2022-07-31 MED ORDER — PREDNISONE 20 MG PO TABS: 60.0000 mg | ORAL_TABLET | Freq: Every day | ORAL | 0 refills | Status: AC

## 2022-07-31 MED ORDER — ADULT MULTIVITAMIN W/MINERALS CH
1.0000 | ORAL_TABLET | Freq: Every day | ORAL | Status: DC
  Administered 2022-07-31: 1 via ORAL
  Filled 2022-07-31: qty 1

## 2022-07-31 MED ORDER — FLUTICASONE PROPIONATE 50 MCG/ACT NA SUSP
2.0000 | Freq: Every day | NASAL | Status: DC
  Administered 2022-07-31: 2 via NASAL
  Filled 2022-07-31: qty 16

## 2022-07-31 NOTE — Progress Notes (Signed)
[facility] Progress Note  Meghan Alexander 67 y.o. 03/17/1956   Subjective: 4 watery stools in last 24 hours. Frustrated with 6 months of diarrhea that continues.  Objective: Vital signs: Vitals:   07/30/22 2126 07/31/22 0431  BP: (!) 154/87 (!) 148/85  Pulse: 83 60  Resp: 20 18  Temp: 98.2 F (36.8 C) 97.8 F (36.6 C)  SpO2: 95% 98%    Physical Exam: Gen: alert, no acute distress, well-nourished HEENT: anicteric sclera CV: RRR Chest: CTA B Abd: minimal diffuse tenderness without guarding, soft, nondistended, +BS Ext: no edema  Lab Results: Recent Labs    07/28/22 2252 07/29/22 0525 07/30/22 0617 07/31/22 0529  NA  --    < > 138 137  K  --    < > 4.1 3.8  CL  --    < > 101 102  CO2  --    < > 28 26  GLUCOSE  --    < > 256* 309*  BUN  --    < > 12 16  CREATININE  --    < > 0.69 0.71  CALCIUM  --    < > 7.9* 8.3*  MG 1.6*  --   --   --    < > = values in this interval not displayed.   Recent Labs    07/30/22 0617 07/31/22 0529  AST 52* 40  ALT 45* 56*  ALKPHOS 72 78  BILITOT 0.8 0.4  PROT 5.6* 5.5*  ALBUMIN 3.3* 3.4*   Recent Labs    07/30/22 0617 07/31/22 0529  WBC 9.4 9.6  HGB 12.1 12.4  HCT 37.0 38.6  MCV 82.8 84.1  PLT 217 193      Assessment/Plan: Crohn's colitis - Norovirus on stool studies (question if false positive due to recent notification from hospital lab about this possibiity) but her diarrhea has been chronic but could have had failure to improve on outpt Prednisone due to Norovirus. Reasurance given. Supportive care. Clinically stable to go home today. Needs to be discharged on Prednisone 60 mg/day and remain on that dose until seen by Dr. Dulce Sellar. She likely will need to be started on a biologic but defer that to Dr. Dulce Sellar. Our office will arrange f/u with Dr. Dulce Sellar for 2-3 weeks.    [name] 07/31/2022, 11:21 AM  Questions please call [phone] ID: Meghan Alexander, female   DOB: 1956/03/12, 67 y.o.    MRN: [id number]

## 2022-07-31 NOTE — TOC Transition Note (Signed)
Transition of Care [facility]) - CM/SW Discharge Note   Patient Details  Name: Meghan Alexander MRN: [id number] Date of Birth: 02/18/1956  [facility]) CM/SW Contact:  Adrian Prows, RN Phone Number: 07/31/2022, 12:18 PM   Clinical Narrative:    TOC notified pt would like voucher for Lyft; spoke w/ pt in room; pt says she has money for transportation, and she will arrange it; no TOC needs.         Patient Goals and CMS Choice      Discharge Placement                         Discharge Plan and Services Additional resources added to the After Visit Summary for                                       Social Determinants of Health (SDOH) Interventions SDOH Screenings   Food Insecurity: No Food Insecurity (07/28/2022)  Housing: Low Risk  (07/28/2022)  Transportation Needs: No Transportation Needs (07/28/2022)  Utilities: Not At Risk (07/28/2022)  Tobacco Use: Low Risk  (07/28/2022)     Readmission Risk Interventions    07/31/2022   11:35 AM 10/11/2021    1:21 PM  Readmission Risk Prevention Plan  Transportation Screening Complete   PCP or Specialist Appt within 3-5 Days Complete Complete  HRI or Home Care Consult Complete Complete  Social Work Consult for Recovery Care Planning/Counseling Complete   Palliative Care Screening Not Applicable   Medication Review [employer]) Complete Complete

## 2022-07-31 NOTE — Discharge Summary (Signed)
Physician Discharge Summary  Meghan Alexander ZOX:[id number] DOB: 10/28/1955 DOA: 07/28/2022  PCP: Burton Apley, MD  Admit date: 07/28/2022 Discharge date: 07/31/2022 30 Day Unplanned Readmission Risk Score    Flowsheet Row Admission (Current) from 07/28/2022 in [facility]  30 Day Unplanned Readmission Risk Score (%) 22.26 Filed at 07/31/2022 0801       This score is the patient's risk of an unplanned readmission within 30 days of being discharged (0 -100%). The score is based on dignosis, age, lab data, medications, orders, and past utilization.   Low:  0-14.9   Medium: 15-21.9   High: 22-29.9   Extreme: 30 and above          Admitted From: Home Disposition: Home  Recommendations for Outpatient Follow-up:  Follow up with PCP in 1-2 weeks Please obtain BMP/CBC in one week Follow-up with GI.  They will arrange date and time. Please follow up with your PCP on the following pending results: Unresulted Labs (From admission, onward)     Start     Ordered   08/04/22 0500  Creatinine, serum  (enoxaparin (LOVENOX)    CrCl >/= 30 ml/min)  Weekly,   R     Comments: while on enoxaparin therapy    07/28/22 1833   07/31/22 0807  Hemoglobin A1c  Once,   R        07/31/22 0806   07/30/22 0500  CBC  Daily,   R      07/29/22 1436   07/30/22 0500  Comprehensive metabolic panel  Daily,   R      07/29/22 1436              Home Health: None Equipment/Devices: None  Discharge Condition: Stable CODE STATUS: Full code Diet recommendation: Cardiac  Subjective: Seen and examined.  She has no complaints.  Her diarrhea has improved significantly, she now has semisolid stools and she has had only 4 bowel movements in the last 48 hours.  However when I discussed with her about potentially discharging home today, she became very anxious and apprehensive and did not feel comfortable going home only because " I am afraid that my diarrhea may come back" I tried  to counsel her extensively but she wanted to hear from GI.  Dr. Bosie Clos spoke to her and convinced her to go home and now she is agreeable.  Brief/Interim Summary: Meghan Alexander is a 67 y.o. female with medical history significant for atrial fibrillation, patient follicular lymphoma status postchemotherapy and Rituxan in remission patient insulin-dependent type 2 diabetes, hypertension, chronic diarrhea since October 2023 who was admitted to the hospitalist service at the request of her gastroenterologist Dr. Dulce Sellar.    Chronic intractable diffuse watery diarrhea, presumed to be Crohn's colitis: -Followed in the outpatient setting by GI, reports loose to watery stool since November of last year some 4 months ago.  GI started her on prednisone.  Her symptoms have improved, she has had only 4 bowel movements in the last 48 hours. Norovirus positive on stool studies (question if false positive due to recent notification from hospital lab about this possibiity), according to GI, but her diarrhea has been chronic but could have had failure to improve on outpt Prednisone due to Norovirus. Reasurance given. Supportive care and she is cleared for discharge.   Uncontrolled type 2 diabetes mellitus with hyperglycemia Resume home medications.   HTN (hypertension) ILD (interstitial lung disease) Resume home medications.   Discharge plan was  discussed with patient and/or family member and they verbalized understanding and agreed with it.  Discharge Diagnoses:  Principal Problem:   Diarrhea Active Problems:   Achalasia   Uncontrolled type 2 diabetes mellitus with hyperglycemia   HTN (hypertension)   Hypokalemia   ILD (interstitial lung disease)   Chronic diarrhea of unknown origin   Chronic diarrhea    Discharge Instructions   Allergies as of 07/31/2022       Reactions   Latex Other (See Comments)   Blisters / skin peeling   Codeine Nausea And Vomiting, Other (See Comments)   Hallucinations  also   Atenolol Cough   Other Itching, Swelling, Other (See Comments)   DermaPlast; Used after surgery - caused swelling, itching, and wound broke open   Hydrocodone Nausea And Vomiting   Oxycodone Nausea And Vomiting        Medication List     STOP taking these medications    fluticasone-salmeterol 115-21 MCG/ACT inhaler Commonly known as: Advair HFA   loperamide 2 MG capsule Commonly known as: IMODIUM   loperamide 2 MG tablet Commonly known as: IMODIUM A-D   meloxicam 15 MG tablet Commonly known as: MOBIC       TAKE these medications    Accu-Chek Guide test strip Generic drug: glucose blood Use to test blood sugars up to 4 times daily as directed.   Accu-Chek Guide w/Device Kit Use to test blood sugars up to 4 times daily.   Accu-Chek Softclix Lancets lancets Use to test blood sugars up to 4 times daily as directed.   Benadryl Allergy 25 MG tablet Generic drug: diphenhydrAMINE Take 25 mg by mouth at bedtime.   Cartia XT 240 MG 24 hr capsule Generic drug: diltiazem Take 1 capsule (240 mg total) by mouth daily.   ferrous sulfate 325 (65 FE) MG tablet Take 1 tablet (325 mg total) by mouth every other day. What changed: when to take this   Florastor 250 MG capsule Generic drug: saccharomyces boulardii Take 250 mg by mouth in the morning. What changed: Another medication with the same name was removed. Continue taking this medication, and follow the directions you see here.   fluticasone 50 MCG/ACT nasal spray Commonly known as: FLONASE Place 2 sprays into both nostrils in the morning.   FreeStyle Libre 2 Sensor Misc Inject 1 Device into the skin every 14 (fourteen) days.   HumaLOG KwikPen 100 UNIT/ML KwikPen Generic drug: insulin lispro Inject 5 Units into the skin with breakfast, with lunch, and with evening meal. What changed:  how much to take when to take this additional instructions   insulin detemir 100 UNIT/ML FlexPen Commonly known as:  LEVEMIR Inject 10 Units into the skin daily. What changed:  how much to take when to take this additional instructions   losartan 50 MG tablet Commonly known as: COZAAR Take 25 mg by mouth in the morning.   Melatonin 10 MG Tabs Take 10 mg by mouth at bedtime.   metFORMIN 500 MG tablet Commonly known as: GLUCOPHAGE Take 500 mg by mouth daily with breakfast.   metoprolol succinate 25 MG 24 hr tablet Commonly known as: TOPROL-XL Take 1 tablet (25 mg total) by mouth daily.   multivitamin tablet Take 1 tablet by mouth daily with breakfast.   omeprazole 20 MG capsule Commonly known as: PRILOSEC Take 20 mg by mouth daily before breakfast.   Pentips 32G X 4 MM Misc Generic drug: Insulin Pen Needle Use to inject insulin up to 4  times daily as directed.   predniSONE 20 MG tablet Commonly known as: DELTASONE Take 3 tablets (60 mg total) by mouth daily with breakfast. What changed:  medication strength how much to take   rosuvastatin 10 MG tablet Commonly known as: CRESTOR Take 10 mg by mouth daily.   TURMERIC PO Take 1 capsule by mouth at bedtime.   venlafaxine XR 37.5 MG 24 hr capsule Commonly known as: EFFEXOR-XR Take 37.5 mg by mouth daily with breakfast.        Follow-up Information     Burton Apley, MD Follow up in 1 week(s).   Specialty: Internal Medicine Contact information: [address] [place] [address] [phone]                Allergies  Allergen Reactions   Latex Other (See Comments)    Blisters / skin peeling   Codeine Nausea And Vomiting and Other (See Comments)    Hallucinations also   Atenolol Cough   Other Itching, Swelling and Other (See Comments)    DermaPlast; Used after surgery - caused swelling, itching, and wound broke open   Hydrocodone Nausea And Vomiting   Oxycodone Nausea And Vomiting    Consultations: GI   Procedures/Studies: No results found.   Discharge Exam: Vitals:   07/30/22 2126 07/31/22  0431  BP: (!) 154/87 (!) 148/85  Pulse: 83 60  Resp: 20 18  Temp: 98.2 F (36.8 C) 97.8 F (36.6 C)  SpO2: 95% 98%   Vitals:   07/30/22 0851 07/30/22 1255 07/30/22 2126 07/31/22 0431  BP: (!) 151/87 130/78 (!) 154/87 (!) 148/85  Pulse: 77 79 83 60  Resp:  18 20 18   Temp:  98.9 F (37.2 C) 98.2 F (36.8 C) 97.8 F (36.6 C)  TempSrc:  Oral Oral Oral  SpO2:  99% 95% 98%  Weight:      Height:        General: Pt is alert, awake, not in acute distress Cardiovascular: RRR, S1/S2 +, no rubs, no gallops Respiratory: CTA bilaterally, no wheezing, no rhonchi Abdominal: Soft, NT, ND, bowel sounds + Extremities: no edema, no cyanosis    The results of significant diagnostics from this hospitalization (including imaging, microbiology, ancillary and laboratory) are listed below for reference.     Microbiology: Recent Results (from the past 240 hour(s))  Stool culture     Status: None (Preliminary result)   Collection Time: 07/28/22  6:34 PM   Specimen: Rectum; Stool  Result Value Ref Range Status   Salmonella/Shigella Screen PENDING  Incomplete   Campylobacter Culture PENDING  Incomplete   E coli, Shiga toxin Assay Negative Negative Final    Comment: (NOTE) Performed At: [facility] [address] [place], [place] [id number] Jolene Schimke MD OZ:[id number]   C Difficile Quick Screen w PCR reflex     Status: None   Collection Time: 07/29/22 12:32 AM   Specimen: STOOL  Result Value Ref Range Status   C Diff antigen NEGATIVE NEGATIVE Final   C Diff toxin NEGATIVE NEGATIVE Final   C Diff interpretation No C. difficile detected.  Final    Comment: Performed at [facility], [address], [place], [place] [address]  Gastrointestinal Panel by PCR , Stool     Status: Abnormal   Collection Time: 07/29/22 12:33 AM   Specimen: Stool  Result Value Ref Range Status   Campylobacter species NOT DETECTED NOT DETECTED Final   Plesimonas shigelloides NOT  DETECTED NOT DETECTED  Final   Salmonella species NOT DETECTED NOT DETECTED Final   Yersinia enterocolitica NOT DETECTED NOT DETECTED Final   Vibrio species NOT DETECTED NOT DETECTED Final   Vibrio cholerae NOT DETECTED NOT DETECTED Final   Enteroaggregative E coli (EAEC) NOT DETECTED NOT DETECTED Final   Enteropathogenic E coli (EPEC) NOT DETECTED NOT DETECTED Final   Enterotoxigenic E coli (ETEC) NOT DETECTED NOT DETECTED Final   Shiga like toxin producing E coli (STEC) NOT DETECTED NOT DETECTED Final   Shigella/Enteroinvasive E coli (EIEC) NOT DETECTED NOT DETECTED Final   Cryptosporidium NOT DETECTED NOT DETECTED Final   Cyclospora cayetanensis NOT DETECTED NOT DETECTED Final   Entamoeba histolytica NOT DETECTED NOT DETECTED Final   Giardia lamblia NOT DETECTED NOT DETECTED Final   Adenovirus F40/41 NOT DETECTED NOT DETECTED Final   Astrovirus NOT DETECTED NOT DETECTED Final   Norovirus GI/GII DETECTED (A) NOT DETECTED Final    Comment: CRITICAL RESULT CALLED TO, READ BACK BY AND VERIFIED WITH: DINA O'QUINN 07/30/22 @ 1530 BY SB    Rotavirus A NOT DETECTED NOT DETECTED Final   Sapovirus (I, II, IV, and V) NOT DETECTED NOT DETECTED Final    Comment: Performed at [facility], [address] [place], [place] [address]     Labs: BNP (last 3 results) Recent Labs    08/02/21 1538  BNP 26.1   Basic Metabolic Panel: Recent Labs  Lab 07/28/22 1900 07/28/22 2252 07/29/22 0525 07/30/22 0617 07/31/22 0529  NA 139  --  139 138 137  K 2.8*  --  3.2* 4.1 3.8  CL 97*  --  97* 101 102  CO2 26  --  29 28 26   GLUCOSE 252*  --  267* 256* 309*  BUN 24*  --  17 12 16   CREATININE 1.01*  --  0.66 0.69 0.71  CALCIUM 8.5*  --  8.1* 7.9* 8.3*  MG  --  1.6*  --   --   --    Liver Function Tests: Recent Labs  Lab 07/28/22 1900 07/30/22 0617 07/31/22 0529  AST 35 52* 40  ALT 39 45* 56*  ALKPHOS 85 72 78  BILITOT 0.3 0.8 0.4  PROT 5.9* 5.6* 5.5*  ALBUMIN 3.7 3.3* 3.4*    No results for input(s): "LIPASE", "AMYLASE" in the last 168 hours. No results for input(s): "AMMONIA" in the last 168 hours. CBC: Recent Labs  Lab 07/28/22 1900 07/29/22 0525 07/30/22 0617 07/31/22 0529  WBC 10.0 8.5 9.4 9.6  HGB 12.5 12.4 12.1 12.4  HCT 38.7 37.8 37.0 38.6  MCV 83.6 81.5 82.8 84.1  PLT 257 226 217 193   Cardiac Enzymes: No results for input(s): "CKTOTAL", "CKMB", "CKMBINDEX", "TROPONINI" in the last 168 hours. BNP: Invalid input(s): "POCBNP" CBG: Recent Labs  Lab 07/30/22 1157 07/30/22 1544 07/30/22 1828 07/30/22 2125 07/31/22 0724  GLUCAP 306* 206* 299* 248* 312*   D-Dimer No results for input(s): "DDIMER" in the last 72 hours. Hgb A1c No results for input(s): "HGBA1C" in the last 72 hours. Lipid Profile No results for input(s): "CHOL", "HDL", "LDLCALC", "TRIG", "CHOLHDL", "LDLDIRECT" in the last 72 hours. Thyroid function studies No results for input(s): "TSH", "T4TOTAL", "T3FREE", "THYROIDAB" in the last 72 hours.  Invalid input(s): "FREET3" Anemia work up No results for input(s): "VITAMINB12", "FOLATE", "FERRITIN", "TIBC", "IRON", "RETICCTPCT" in the last 72 hours. Urinalysis    Component Value Date/Time   COLORURINE AMBER (A) 02/28/2022 2039   APPEARANCEUR HAZY (A) 02/28/2022 2039   LABSPEC  1.018 02/28/2022 2039   PHURINE 5.0 02/28/2022 2039   GLUCOSEU NEGATIVE 02/28/2022 2039   HGBUR NEGATIVE 02/28/2022 2039   BILIRUBINUR NEGATIVE 02/28/2022 2039   KETONESUR NEGATIVE 02/28/2022 2039   PROTEINUR NEGATIVE 02/28/2022 2039   NITRITE NEGATIVE 02/28/2022 2039   LEUKOCYTESUR NEGATIVE 02/28/2022 2039   Sepsis Labs Recent Labs  Lab 07/28/22 1900 07/29/22 0525 07/30/22 0617 07/31/22 0529  WBC 10.0 8.5 9.4 9.6   Microbiology Recent Results (from the past 240 hour(s))  Stool culture     Status: None (Preliminary result)   Collection Time: 07/28/22  6:34 PM   Specimen: Rectum; Stool  Result Value Ref Range Status    Salmonella/Shigella Screen PENDING  Incomplete   Campylobacter Culture PENDING  Incomplete   E coli, Shiga toxin Assay Negative Negative Final    Comment: (NOTE) Performed At: [facility] [address] [place], [place] [id number] Jolene SchimkeNagendra Sanjai MD WU:[id number]:[phone]   C Difficile Quick Screen w PCR reflex     Status: None   Collection Time: 07/29/22 12:32 AM   Specimen: STOOL  Result Value Ref Range Status   C Diff antigen NEGATIVE NEGATIVE Final   C Diff toxin NEGATIVE NEGATIVE Final   C Diff interpretation No C. difficile detected.  Final    Comment: Performed at [name], [address] [place], [place] [id number]  Gastrointestinal Panel by PCR , Stool     Status: Abnormal   Collection Time: 07/29/22 12:33 AM   Specimen: Stool  Result Value Ref Range Status   Campylobacter species NOT DETECTED NOT DETECTED Final   Plesimonas shigelloides NOT DETECTED NOT DETECTED Final   Salmonella species NOT DETECTED NOT DETECTED Final   Yersinia enterocolitica NOT DETECTED NOT DETECTED Final   Vibrio species NOT DETECTED NOT DETECTED Final   Vibrio cholerae NOT DETECTED NOT DETECTED Final   Enteroaggregative E coli (EAEC) NOT DETECTED NOT DETECTED Final   Enteropathogenic E coli (EPEC) NOT DETECTED NOT DETECTED Final   Enterotoxigenic E coli (ETEC) NOT DETECTED NOT DETECTED Final   Shiga like toxin producing E coli (STEC) NOT DETECTED NOT DETECTED Final   Shigella/Enteroinvasive E coli (EIEC) NOT DETECTED NOT DETECTED Final   Cryptosporidium NOT DETECTED NOT DETECTED Final   Cyclospora cayetanensis NOT DETECTED NOT DETECTED Final   Entamoeba histolytica NOT DETECTED NOT DETECTED Final   Giardia lamblia NOT DETECTED NOT DETECTED Final   Adenovirus F40/41 NOT DETECTED NOT DETECTED Final   Astrovirus NOT DETECTED NOT DETECTED Final   Norovirus GI/GII DETECTED (A) NOT DETECTED Final    Comment: CRITICAL RESULT CALLED TO, READ BACK BY AND VERIFIED WITH: DINA O'QUINN  07/30/22 @ 1530 BY SB    Rotavirus A NOT DETECTED NOT DETECTED Final   Sapovirus (I, II, IV, and V) NOT DETECTED NOT DETECTED Final    Comment: Performed at [facility], [address] [place], [place] [address]     Time coordinating discharge: Over 30 minutes  SIGNED:   Hughie Clossavi Alany Borman, MD  [facility] 07/31/2022, 11:24 AM *Please note that this is a verbal dictation therefore any spelling or grammatical errors are due to the "Dragon [employer] One" system interpretation. If 7PM-7AM, please contact night-coverage [email or web address]

## 2022-08-02 LAB — STOOL CULTURE: E coli, Shiga toxin Assay: NEGATIVE

## 2022-08-02 LAB — STOOL CULTURE REFLEX - CMPCXR

## 2022-08-03 ENCOUNTER — Other Ambulatory Visit: Payer: Self-pay | Admitting: Gastroenterology

## 2022-08-03 ENCOUNTER — Ambulatory Visit
Admission: RE | Admit: 2022-08-03 | Discharge: 2022-08-03 | Disposition: A | Discharge status: Home / Self Care | Payer: Medicare Other | Source: Ambulatory Visit | Attending: Gastroenterology | Admitting: Gastroenterology

## 2022-08-03 DIAGNOSIS — Z111 Encounter for screening for respiratory tuberculosis: Secondary | ICD-10-CM

## 2022-09-15 ENCOUNTER — Encounter: Payer: Self-pay | Admitting: Pulmonary Disease

## 2022-09-15 ENCOUNTER — Ambulatory Visit: Payer: Medicare Other | Admitting: Pulmonary Disease

## 2022-09-15 VITALS — BP 134/84 | HR 111 | Temp 98.6°F | Ht 62.0 in | Wt 178.6 lb

## 2022-09-15 DIAGNOSIS — B370 Candidal stomatitis: Secondary | ICD-10-CM

## 2022-09-15 DIAGNOSIS — J984 Other disorders of lung: Secondary | ICD-10-CM | POA: Diagnosis not present

## 2022-09-15 MED ORDER — FLUCONAZOLE 100 MG PO TABS
100.0000 mg | ORAL_TABLET | Freq: Every day | ORAL | 0 refills | Status: DC
Start: 2022-09-15 — End: 2022-09-26

## 2022-09-15 NOTE — Patient Instructions (Signed)
Continue nasal sprays for sinus drainage. Rinse mouth and throat after use.  Take fluconazole 100mg  daily for 5 days for thrush  Follow up as needed

## 2022-09-15 NOTE — Progress Notes (Signed)
Synopsis: Referred in April 2023 for abnormal CT chest findings by Johney Maine, MD  Subjective:   PATIENT ID: Meghan Alexander GENDER: female DOB: 15-Oct-1955, MRN: [id number]  HPI  Chief Complaint  Patient presents with   Follow-up    6 month f/u.  Thrush x 3 weeks.  After nasal spray use.   Meghan Alexander is a 67 year old woman, never smoker with allergies, achalasia, GERD, hypertension, DM II and follicular lymphoma who returns to pulmonary clinic for pneumonitis.   She was diagnosed with chron's colitis, she was started on high dose steroids last month for the colitis and recently started cyltezo. She reports developing thrush over recent weeks. She has been doing salt water rinses with some relief. She denies any issues with her respiratory status at this time.   OV 02/14/22 She was re-started on prednisone taper due to her increase in symptoms after completion of the prior steroid taper. She is feeling better with less dyspnea.  ONO results did not indicate a need for nocturnal oxygen.   She is having a knee replacement surgery in the near future.   OV 12/23/21 She reports increasing shortness of breath since completion of steroid taper on 11/08/21. She reports her O2 levels dropping into the 80s based on her home pulse oximeter. She has dyspnea with brushing her teeth or talking on the phone.   OV 11/05/21 Autoimmune lab panel was unremarkable at last visit. She has been tapered down to 10mg  of prednisone daily. Her breathing has significantly improved. She is doing well since her right ankle surgery.   OV 09/24/21 She was started on 40mg  prednisone daily on 4/19 which was reduced to 20mg  daily on 5/2 prior to her ankle surgery on 09/03/21. She was admitted 5/15 to 5/17 for acute hypoxemic respiratory failure. She had an increase in bilateral opacities on CTA Chest scan 5/15. She was seen by inpatient pulmonary team with concern for peri-operative aspiration vs acute infiltrates  from covid 19 infection on chronic interstitial findings. She was discharged on home O2 as she desatted with ambulation.  She has not been using oxygen over the past couple of weeks. She resumed 40mg  of prednisone daily and is taking bactrim 3 days per week. She denies cough, joint pains or fevers. She continues to have night sweats. She reports her achalasia symptoms have significantly improved since taking steroids.   Initial OV 08/11/21 She was admitted 4/10 to 4/17 for acute respiratory failure with diffuse ground glass opacities noted on her CT Chest scan. She had progressive cough, night sweats, chills and dyspnea. She completed 3 courses of outpatient antibiotics without improvement. She underwent bronchoscopy with BAL of the RML and transbronchial brushings of the RLL on 4/11. BAL cell count was predominantly lymphocytic. Pathology did not indicate malignant cells on brushings. There was report of concern for actinomyces on path report. BAL culture grew strep mitis. Fungtitell and quantiferon gold are negative. ID was consulted. Patient was treated with ceftriaxone and azithromycin and placed on augmentin for 14 day course and treated with high dose IV steroids and discharged on 5 days of prednisone 40mg  daily. ESR was 75 on 08/02/21  She reports noticing improvement in her breathing after receiving the IV steroids. She did not require supplemental oxygen at time of discharge.   Upon chart review, she received her rituximab infusion on 02/25/21 and was seen again in Oncology clinic 05/11/21 where it was noted she had bronchitis and treated for pneumonia since the  infusion. There is no chest imaging for review from late 2022 or early 2023.   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)  04/02/2019   GERD (gastroesophageal reflux disease)    hx of reflux-went away with elevating HOB    Hypertension    Pneumonia    hx of    PONV (postoperative nausea and vomiting)    Type 2 diabetes mellitus (HCC) 08/02/2021     Family History  Problem Relation Age of Onset   Lung cancer Father        1982 died from it   Hernia Mother    Breast cancer Maternal Aunt        multiple    Breast cancer Maternal Grandmother    Breast cancer Paternal Grandmother    Breast cancer Cousin        cousins multiple    Colon cancer Neg Hx    Colon polyps Neg Hx    Rectal cancer Neg Hx    Stomach cancer Neg Hx      Social History   Socioeconomic History   Marital status: Married    Spouse name: Not on file   Number of children: Not on file   Years of education: Not on file   Highest education level: Not on file  Occupational History   Not on file  Tobacco Use   Smoking status: Never   Smokeless tobacco: Never  Vaping Use   Vaping Use: Never used  Substance and Sexual Activity   Alcohol use: No    Alcohol/week: 0.0 standard drinks of alcohol   Drug use: No   Sexual activity: Not on file  Other Topics Concern   Not on file  Social History Narrative   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, newspaper at [employer]   Never smoker never drinker   No drugs   Multiple allergies   Lives at home with her husband of 8 years since 2008   Social Determinants of Health   Financial Resource Strain: Not on file  Food Insecurity: No Food Insecurity (07/28/2022)   Hunger Vital Sign    Worried About Running Out of Food in the Last Year: Never true    Ran Out of Food in the Last Year: Never true  Transportation Needs: No Transportation Needs (07/28/2022)   PRAPARE - Administrator, Civil Service (Medical): No    Lack of Transportation (Non-Medical): No  Physical Activity: Not on file  Stress: Not on file  Social Connections: Not on file  Intimate Partner Violence:  Not At Risk (07/28/2022)   Humiliation, Afraid, Rape, and Kick questionnaire    Fear of Current or Ex-Partner: No    Emotionally Abused: No    Physically Abused: No    Sexually Abused: No     Allergies  Allergen Reactions   Latex Other (See Comments)    Blisters / skin peeling   Codeine Nausea And Vomiting and Other (See Comments)    Hallucinations also   Atenolol Cough   Other Itching, Swelling and Other (See Comments)    DermaPlast; Used after surgery - caused swelling, itching, and wound broke open   Hydrocodone Nausea And Vomiting   Oxycodone Nausea And  Vomiting     Outpatient Medications Prior to Visit  Medication Sig Dispense Refill   Accu-Chek Softclix Lancets lancets Use to test blood sugars up to 4 times daily as directed. 100 each 0   BENADRYL ALLERGY 25 MG tablet Take 25 mg by mouth at bedtime.     Blood Glucose Monitoring Suppl (BLOOD GLUCOSE MONITOR SYSTEM) w/Device KIT Use to test blood sugars up to 4 times daily. 1 kit 0   Continuous Blood Gluc Sensor (FREESTYLE LIBRE 2 SENSOR) MISC Inject 1 Device into the skin every 14 (fourteen) days.     CYLTEZO-CD/UC/HS STARTER 40 MG/0.8ML AJKT Inject 160 mLs into the skin every 14 (fourteen) days. Started 09/10/2022.     diltiazem (CARDIZEM CD) 240 MG 24 hr capsule Take 1 capsule (240 mg total) by mouth daily. 30 capsule 0   fluticasone (FLONASE) 50 MCG/ACT nasal spray Place 2 sprays into both nostrils in the morning.     glucose blood (ACCU-CHEK GUIDE) test strip Use to test blood sugars up to 4 times daily as directed. 100 each 0   HUMALOG KWIKPEN 100 UNIT/ML KwikPen Inject 5 Units into the skin with breakfast, with lunch, and with evening meal. (Patient taking differently: Inject 15 Units into the skin See admin instructions. Inject 15 units into the skin before breakfast and an additional 15 units once a day as needed for elevated BGL) 15 mL 11   insulin detemir (LEVEMIR) 100 UNIT/ML FlexPen Inject 10 Units into the skin daily.  (Patient taking differently: Inject 20 Units into the skin See admin instructions. Inject 20 units into the skin in the morning as needed for elevated BGL) 15 mL 0   Insulin Pen Needle 32G X 4 MM MISC Use to inject insulin up to 4 times daily as directed. 100 each 0   losartan (COZAAR) 50 MG tablet Take 25 mg by mouth in the morning.     Melatonin 10 MG TABS Take 10 mg by mouth at bedtime.     metFORMIN (GLUCOPHAGE) 500 MG tablet Take 500 mg by mouth daily with breakfast.     metoprolol succinate (TOPROL-XL) 25 MG 24 hr tablet Take 1 tablet (25 mg total) by mouth daily. 30 tablet 0   Multiple Vitamin (MULTIVITAMIN) tablet Take 1 tablet by mouth daily with breakfast.     omeprazole (PRILOSEC) 20 MG capsule Take 20 mg by mouth daily before breakfast.     predniSONE (DELTASONE) 10 MG tablet Take 20 mg by mouth daily.     rosuvastatin (CRESTOR) 10 MG tablet Take 10 mg by mouth daily.     saccharomyces boulardii (FLORASTOR) 250 MG capsule Take 250 mg by mouth in the morning.     TURMERIC PO Take 1 capsule by mouth at bedtime.     venlafaxine XR (EFFEXOR-XR) 37.5 MG 24 hr capsule Take 37.5 mg by mouth daily with breakfast.     ferrous sulfate 325 (65 FE) MG tablet Take 1 tablet (325 mg total) by mouth every other day. (Patient not taking: Reported on 09/15/2022) 30 tablet 0   No facility-administered medications prior to visit.   Review of Systems  Constitutional:  Negative for chills, fever, malaise/fatigue and weight loss.  HENT:  Negative for congestion, sinus pain and sore throat.   Eyes: Negative.   Respiratory:  Negative for cough, hemoptysis, sputum production, shortness of breath and wheezing.   Cardiovascular:  Negative for chest pain, palpitations, orthopnea, claudication and leg swelling.  Gastrointestinal:  Negative for abdominal pain,  heartburn, nausea and vomiting.  Genitourinary: Negative.   Musculoskeletal:  Negative for joint pain and myalgias.  Skin:  Negative for rash.   Neurological:  Negative for weakness.  Endo/Heme/Allergies: Negative.   Psychiatric/Behavioral: Negative.      Objective:   Vitals:   09/15/22 1406  BP: 134/84  Pulse: (!) 111  Temp: 98.6 F (37 C)  TempSrc: Oral  SpO2: 99%  Weight: 178 lb 9.6 oz (81 kg)  Height: 5\' 2"  (1.575 m)   Physical Exam Constitutional:      General: She is not in acute distress.    Appearance: She is not ill-appearing.  HENT:     Head: Normocephalic and atraumatic.  Cardiovascular:     Rate and Rhythm: Normal rate and regular rhythm.     Pulses: Normal pulses.     Heart sounds: Normal heart sounds. No murmur heard. Pulmonary:     Effort: Pulmonary effort is normal.     Breath sounds: Normal breath sounds. Decreased air movement present. No wheezing, rhonchi or rales.  Musculoskeletal:     Right lower leg: No edema.     Left lower leg: No edema.  Skin:    General: Skin is warm and dry.  Neurological:     General: No focal deficit present.     Mental Status: She is alert.    CBC    Component Value Date/Time   WBC 9.6 07/31/2022 0529   RBC 4.59 07/31/2022 0529   HGB 12.4 07/31/2022 0529   HGB 9.9 (L) 03/28/2022 0839   HGB 12.0 12/09/2021 1339   HCT 38.6 07/31/2022 0529   HCT 37.1 12/09/2021 1339   PLT 193 07/31/2022 0529   PLT 447 (H) 03/28/2022 0839   PLT 325 12/09/2021 1339   MCV 84.1 07/31/2022 0529   MCV 81 12/09/2021 1339   MCH 27.0 07/31/2022 0529   MCHC 32.1 07/31/2022 0529   RDW 13.4 07/31/2022 0529   RDW 16.7 (H) 12/09/2021 1339   LYMPHSABS 0.4 (L) 03/28/2022 0839   MONOABS 0.5 03/28/2022 0839   EOSABS 0.0 03/28/2022 0839   BASOSABS 0.0 03/28/2022 0839      Latest Ref Rng & Units 07/31/2022    5:29 AM 07/30/2022    6:17 AM 07/29/2022    5:25 AM  BMP  Glucose 70 - 99 mg/dL 161  096  045   BUN 8 - 23 mg/dL 16  12  17    Creatinine 0.44 - 1.00 mg/dL 4.09  8.11  9.14   Sodium 135 - 145 mmol/L 137  138  139   Potassium 3.5 - 5.1 mmol/L 3.8  4.1  3.2   Chloride 98 - 111  mmol/L 102  101  97   CO2 22 - 32 mmol/L 26  28  29    Calcium 8.9 - 10.3 mg/dL 8.3  7.9  8.1    Chest imaging: HRCT Chest 01/25/22 1. No evidence of fibrotic interstitial lung disease. Minimal bland appearing scarring and or atelectasis of the posterior lingula and left lower lobe unchanged compared to prior examinations benign sequelae of prior infection or inflammation. 2. Previously seen acute airspace disease is resolved. 3. Lobular air trapping on expiratory phase imaging, suggestive of small airways disease. 4. 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 5. No evidence of lymphadenopathy in the chest. 6. Coronary artery disease.  CXR 12/23/21 Interval improvement in right-greater-than-left airspace opacities.  CTA  Chest 10/08/21 1. No evidence of acute pulmonary embolism or other acute vascular findings in the chest. 2. Recurrent/fluctuating bilateral ground-glass and airspace opacities, overall improved compared with prior CT of 1 month ago, but worsened from radiographs of 2 weeks ago. Differential considerations include atypical infection, non infectious inflammation, drug reaction and atypical edema. Given the fluctuation, lymphomatous involvement less likely. Radiographic follow up recommended. 3. No adenopathy or pleural effusion. 4. Dilated esophagus with air-fluid levels consistent with achalasia.  CTA Chest 09/06/21 Mediastinum/Nodes: No enlarged mediastinal, hilar, or axillary lymph nodes. Thyroid gland, and trachea demonstrate no significant findings. As before, again seen is the moderately large fluid-filled dilatation of the esophagus consistent with history of achalasia.   Lungs/Pleura: There are confluent patchy opacities seen throughout the lungs and are more prominent in the upper lobes and  have significantly progressed in the interim. Mild bibasilar pleural thickening and was present on the previous study as well.  CXR 09/06/21 Patchy ill-defined nodular appearing areas are noted throughout the lungs bilaterally. In addition, there is more diffuse airspace consolidation throughout the left mid to lower lung. No definite pleural effusions. No pneumothorax. No evidence of pulmonary edema. Heart size is normal. Upper mediastinal contours are within normal limits. Atherosclerotic calcifications are noted in the thoracic aorta.  CT 08/02/21 No definite evidence of pulmonary embolus. Continued presence patchy airspace opacities throughout both lungs which may be slightly more prominent compared to prior exam, concerning for multifocal pneumonia. Hepatic steatosis. Stable dilated esophagus is noted consistent with history of achalasia. Aortic Atherosclerosis  CT 07/14/21 Mediastinum/Nodes: No discrete thyroid nodule. No pathologically enlarged mediastinal, hilar or axillary lymph nodes. Patulous thoracic esophagus with retained versus refluxed contrast material in the esophagus and mild diffuse esophageal wall thickening.   Lungs/Pleura: Extensive multifocal bilateral ground-glass opacities with interstitial thickening, favored to reflect an infectious or inflammatory etiology. No pleural effusion. No pneumothorax.  PFT:     No data to display          Labs:  Path:  Echo:  Heart Catheterization:    Assessment & Plan:   Pneumonitis  Thrush, oral - Plan: fluconazole (DIFLUCAN) 100 MG tablet  Discussion: Meghan Alexander is a 67 year old woman, never smoker with allergies, achalasia, GERD, hypertension, DM II and follicular lymphoma who returns to pulmonary clinic for pneumonitis.   Chest imaging from last fall shows resolution of bilateral ground glass opacities. She s asymptomatic from a respiratory standpoint at this time.  She is completing steroid  taper for her recent chron's colotis diagnosis.   She is to take 100mg  fluconazole daily for 5 days for thrush. She can continue nasal sprays for sinus drainage.  Follow up as needed.  Melody Comas, MD [facility] Office: [phone]   Current Outpatient Medications:    Accu-Chek Softclix Lancets lancets, Use to test blood sugars up to 4 times daily as directed., Disp: 100 each, Rfl: 0   BENADRYL ALLERGY 25 MG tablet, Take 25 mg by mouth at bedtime., Disp: , Rfl:    Blood Glucose Monitoring Suppl (BLOOD GLUCOSE MONITOR SYSTEM) w/Device KIT, Use to test blood sugars up to 4 times daily., Disp: 1 kit, Rfl: 0   Continuous Blood Gluc Sensor (FREESTYLE LIBRE 2 SENSOR) MISC, Inject 1 Device into the skin every 14 (fourteen) days., Disp: , Rfl:    CYLTEZO-CD/UC/HS STARTER 40 MG/0.8ML AJKT, Inject 160 mLs into the skin every 14 (fourteen) days. Started 09/10/2022., Disp: , Rfl:    diltiazem (CARDIZEM  CD) 240 MG 24 hr capsule, Take 1 capsule (240 mg total) by mouth daily., Disp: 30 capsule, Rfl: 0   fluconazole (DIFLUCAN) 100 MG tablet, Take 1 tablet (100 mg total) by mouth daily., Disp: 5 tablet, Rfl: 0   fluticasone (FLONASE) 50 MCG/ACT nasal spray, Place 2 sprays into both nostrils in the morning., Disp: , Rfl:    glucose blood (ACCU-CHEK GUIDE) test strip, Use to test blood sugars up to 4 times daily as directed., Disp: 100 each, Rfl: 0   HUMALOG KWIKPEN 100 UNIT/ML KwikPen, Inject 5 Units into the skin with breakfast, with lunch, and with evening meal. (Patient taking differently: Inject 15 Units into the skin See admin instructions. Inject 15 units into the skin before breakfast and an additional 15 units once a day as needed for elevated BGL), Disp: 15 mL, Rfl: 11   insulin detemir (LEVEMIR) 100 UNIT/ML FlexPen, Inject 10 Units into the skin daily. (Patient taking differently: Inject 20 Units into the skin See admin instructions. Inject 20 units into the skin in the  morning as needed for elevated BGL), Disp: 15 mL, Rfl: 0   Insulin Pen Needle 32G X 4 MM MISC, Use to inject insulin up to 4 times daily as directed., Disp: 100 each, Rfl: 0   losartan (COZAAR) 50 MG tablet, Take 25 mg by mouth in the morning., Disp: , Rfl:    Melatonin 10 MG TABS, Take 10 mg by mouth at bedtime., Disp: , Rfl:    metFORMIN (GLUCOPHAGE) 500 MG tablet, Take 500 mg by mouth daily with breakfast., Disp: , Rfl:    metoprolol succinate (TOPROL-XL) 25 MG 24 hr tablet, Take 1 tablet (25 mg total) by mouth daily., Disp: 30 tablet, Rfl: 0   Multiple Vitamin (MULTIVITAMIN) tablet, Take 1 tablet by mouth daily with breakfast., Disp: , Rfl:    omeprazole (PRILOSEC) 20 MG capsule, Take 20 mg by mouth daily before breakfast., Disp: , Rfl:    predniSONE (DELTASONE) 10 MG tablet, Take 20 mg by mouth daily., Disp: , Rfl:    rosuvastatin (CRESTOR) 10 MG tablet, Take 10 mg by mouth daily., Disp: , Rfl:    saccharomyces boulardii (FLORASTOR) 250 MG capsule, Take 250 mg by mouth in the morning., Disp: , Rfl:    TURMERIC PO, Take 1 capsule by mouth at bedtime., Disp: , Rfl:    venlafaxine XR (EFFEXOR-XR) 37.5 MG 24 hr capsule, Take 37.5 mg by mouth daily with breakfast., Disp: , Rfl:    ferrous sulfate 325 (65 FE) MG tablet, Take 1 tablet (325 mg total) by mouth every other day. (Patient not taking: Reported on 09/15/2022), Disp: 30 tablet, Rfl: 0

## 2022-09-23 ENCOUNTER — Other Ambulatory Visit: Payer: Self-pay

## 2022-09-23 DIAGNOSIS — C8218 Follicular lymphoma grade II, lymph nodes of multiple sites: Secondary | ICD-10-CM

## 2022-09-26 ENCOUNTER — Emergency Department (HOSPITAL_COMMUNITY): Payer: Medicare Other

## 2022-09-26 ENCOUNTER — Inpatient Hospital Stay: Payer: Medicare Other | Attending: Hematology

## 2022-09-26 ENCOUNTER — Other Ambulatory Visit: Payer: Self-pay

## 2022-09-26 ENCOUNTER — Emergency Department (HOSPITAL_COMMUNITY)
Admission: EM | Admit: 2022-09-26 | Discharge: 2022-09-26 | Disposition: A | Discharge status: Home / Self Care | Payer: Medicare Other | Attending: Student | Admitting: Student

## 2022-09-26 ENCOUNTER — Encounter (HOSPITAL_COMMUNITY): Payer: Self-pay

## 2022-09-26 ENCOUNTER — Inpatient Hospital Stay: Payer: Medicare Other | Admitting: Hematology

## 2022-09-26 VITALS — BP 155/103 | HR 118 | Temp 97.3°F | Resp 18 | Wt 177.5 lb

## 2022-09-26 DIAGNOSIS — E041 Nontoxic single thyroid nodule: Secondary | ICD-10-CM | POA: Insufficient documentation

## 2022-09-26 DIAGNOSIS — X58XXXA Exposure to other specified factors, initial encounter: Secondary | ICD-10-CM | POA: Diagnosis not present

## 2022-09-26 DIAGNOSIS — C8218 Follicular lymphoma grade II, lymph nodes of multiple sites: Secondary | ICD-10-CM

## 2022-09-26 DIAGNOSIS — E042 Nontoxic multinodular goiter: Secondary | ICD-10-CM

## 2022-09-26 DIAGNOSIS — E119 Type 2 diabetes mellitus without complications: Secondary | ICD-10-CM | POA: Diagnosis not present

## 2022-09-26 DIAGNOSIS — Z794 Long term (current) use of insulin: Secondary | ICD-10-CM | POA: Insufficient documentation

## 2022-09-26 DIAGNOSIS — N390 Urinary tract infection, site not specified: Secondary | ICD-10-CM

## 2022-09-26 DIAGNOSIS — Z7984 Long term (current) use of oral hypoglycemic drugs: Secondary | ICD-10-CM | POA: Diagnosis not present

## 2022-09-26 DIAGNOSIS — I10 Essential (primary) hypertension: Secondary | ICD-10-CM | POA: Diagnosis not present

## 2022-09-26 DIAGNOSIS — M549 Dorsalgia, unspecified: Secondary | ICD-10-CM

## 2022-09-26 DIAGNOSIS — Z91040 Latex allergy status: Secondary | ICD-10-CM | POA: Insufficient documentation

## 2022-09-26 DIAGNOSIS — S22060A Wedge compression fracture of T7-T8 vertebra, initial encounter for closed fracture: Secondary | ICD-10-CM | POA: Diagnosis not present

## 2022-09-26 DIAGNOSIS — S22080A Wedge compression fracture of T11-T12 vertebra, initial encounter for closed fracture: Secondary | ICD-10-CM

## 2022-09-26 DIAGNOSIS — R911 Solitary pulmonary nodule: Secondary | ICD-10-CM

## 2022-09-26 DIAGNOSIS — S22089A Unspecified fracture of T11-T12 vertebra, initial encounter for closed fracture: Secondary | ICD-10-CM | POA: Diagnosis not present

## 2022-09-26 DIAGNOSIS — R918 Other nonspecific abnormal finding of lung field: Secondary | ICD-10-CM

## 2022-09-26 DIAGNOSIS — M546 Pain in thoracic spine: Secondary | ICD-10-CM | POA: Diagnosis not present

## 2022-09-26 LAB — CBC WITH DIFFERENTIAL (CANCER CENTER ONLY)
Abs Immature Granulocytes: 0.3 10*3/uL — ABNORMAL HIGH (ref 0.00–0.07)
Basophils Absolute: 0.1 10*3/uL (ref 0.0–0.1)
Basophils Relative: 1 %
Eosinophils Absolute: 0.1 10*3/uL (ref 0.0–0.5)
Eosinophils Relative: 1 %
HCT: 40.6 % (ref 36.0–46.0)
Hemoglobin: 13.1 g/dL (ref 12.0–15.0)
Immature Granulocytes: 5 %
Lymphocytes Relative: 15 %
Lymphs Abs: 1 10*3/uL (ref 0.7–4.0)
MCH: 27.4 pg (ref 26.0–34.0)
MCHC: 32.3 g/dL (ref 30.0–36.0)
MCV: 84.9 fL (ref 80.0–100.0)
Monocytes Absolute: 1.3 10*3/uL — ABNORMAL HIGH (ref 0.1–1.0)
Monocytes Relative: 19 %
Neutro Abs: 3.9 10*3/uL (ref 1.7–7.7)
Neutrophils Relative %: 59 %
Platelet Count: 230 10*3/uL (ref 150–400)
RBC: 4.78 MIL/uL (ref 3.87–5.11)
RDW: 15.3 % (ref 11.5–15.5)
WBC Count: 6.6 10*3/uL (ref 4.0–10.5)
nRBC: 0 % (ref 0.0–0.2)

## 2022-09-26 LAB — CBC WITH DIFFERENTIAL/PLATELET
Abs Immature Granulocytes: 0.28 10*3/uL — ABNORMAL HIGH (ref 0.00–0.07)
Basophils Absolute: 0 10*3/uL (ref 0.0–0.1)
Basophils Relative: 1 %
Eosinophils Absolute: 0.1 10*3/uL (ref 0.0–0.5)
Eosinophils Relative: 1 %
HCT: 41 % (ref 36.0–46.0)
Hemoglobin: 12.6 g/dL (ref 12.0–15.0)
Immature Granulocytes: 5 %
Lymphocytes Relative: 14 %
Lymphs Abs: 0.8 10*3/uL (ref 0.7–4.0)
MCH: 26.6 pg (ref 26.0–34.0)
MCHC: 30.7 g/dL (ref 30.0–36.0)
MCV: 86.5 fL (ref 80.0–100.0)
Monocytes Absolute: 1.1 10*3/uL — ABNORMAL HIGH (ref 0.1–1.0)
Monocytes Relative: 19 %
Neutro Abs: 3.3 10*3/uL (ref 1.7–7.7)
Neutrophils Relative %: 60 %
Platelets: 231 10*3/uL (ref 150–400)
RBC: 4.74 MIL/uL (ref 3.87–5.11)
RDW: 15.4 % (ref 11.5–15.5)
WBC: 5.6 10*3/uL (ref 4.0–10.5)
nRBC: 0 % (ref 0.0–0.2)

## 2022-09-26 LAB — URINALYSIS, ROUTINE W REFLEX MICROSCOPIC
Bilirubin Urine: NEGATIVE
Glucose, UA: NEGATIVE mg/dL
Hgb urine dipstick: NEGATIVE
Ketones, ur: NEGATIVE mg/dL
Nitrite: NEGATIVE
Protein, ur: NEGATIVE mg/dL
Specific Gravity, Urine: 1.023 (ref 1.005–1.030)
pH: 5 (ref 5.0–8.0)

## 2022-09-26 LAB — BASIC METABOLIC PANEL
Anion gap: 12 (ref 5–15)
BUN: 24 mg/dL — ABNORMAL HIGH (ref 8–23)
CO2: 26 mmol/L (ref 22–32)
Calcium: 9.1 mg/dL (ref 8.9–10.3)
Chloride: 102 mmol/L (ref 98–111)
Creatinine, Ser: 1.03 mg/dL — ABNORMAL HIGH (ref 0.44–1.00)
GFR, Estimated: 59 mL/min — ABNORMAL LOW (ref >60)
Glucose, Bld: 150 mg/dL — ABNORMAL HIGH (ref 70–99)
Potassium: 4.4 mmol/L (ref 3.5–5.1)
Sodium: 140 mmol/L (ref 135–145)

## 2022-09-26 LAB — LACTATE DEHYDROGENASE: LDH: 457 U/L — ABNORMAL HIGH (ref 98–192)

## 2022-09-26 LAB — CMP (CANCER CENTER ONLY)
ALT: 60 U/L — ABNORMAL HIGH (ref 0–44)
AST: 37 U/L (ref 15–41)
Albumin: 4.3 g/dL (ref 3.5–5.0)
Alkaline Phosphatase: 119 U/L (ref 38–126)
Anion gap: 8 (ref 5–15)
BUN: 23 mg/dL (ref 8–23)
CO2: 30 mmol/L (ref 22–32)
Calcium: 9.6 mg/dL (ref 8.9–10.3)
Chloride: 102 mmol/L (ref 98–111)
Creatinine: 1.04 mg/dL — ABNORMAL HIGH (ref 0.44–1.00)
GFR, Estimated: 58 mL/min — ABNORMAL LOW (ref >60)
Glucose, Bld: 159 mg/dL — ABNORMAL HIGH (ref 70–99)
Potassium: 4.6 mmol/L (ref 3.5–5.1)
Sodium: 140 mmol/L (ref 135–145)
Total Bilirubin: 0.4 mg/dL (ref 0.3–1.2)
Total Protein: 6.5 g/dL (ref 6.5–8.1)

## 2022-09-26 LAB — SAMPLE TO BLOOD BANK

## 2022-09-26 LAB — CBG MONITORING, ED: Glucose-Capillary: 223 mg/dL — ABNORMAL HIGH (ref 70–99)

## 2022-09-26 MED ORDER — ONDANSETRON HCL 4 MG/2ML IJ SOLN
4.0000 mg | Freq: Once | INTRAMUSCULAR | Status: AC
  Administered 2022-09-26: 4 mg via INTRAVENOUS
  Filled 2022-09-26: qty 2

## 2022-09-26 MED ORDER — OXYCODONE HCL 5 MG PO TABS: 5.0000 mg | ORAL_TABLET | Freq: Four times a day (QID) | ORAL | 0 refills | Status: DC | PRN

## 2022-09-26 MED ORDER — NYSTATIN 100000 UNIT/ML MT SUSP: 5.0000 mL | Freq: Four times a day (QID) | OROMUCOSAL | 0 refills | Status: DC

## 2022-09-26 MED ORDER — HYDROMORPHONE HCL 1 MG/ML IJ SOLN
1.0000 mg | Freq: Once | INTRAMUSCULAR | Status: AC
  Administered 2022-09-26: 1 mg via INTRAVENOUS
  Filled 2022-09-26: qty 1

## 2022-09-26 MED ORDER — ONDANSETRON 4 MG PO TBDP: 4.0000 mg | ORAL_TABLET | Freq: Three times a day (TID) | ORAL | 0 refills | Status: DC | PRN

## 2022-09-26 MED ORDER — IOHEXOL 350 MG/ML SOLN
100.0000 mL | Freq: Once | INTRAVENOUS | Status: AC | PRN
  Administered 2022-09-26: 100 mL via INTRAVENOUS

## 2022-09-26 MED ORDER — HYDROMORPHONE HCL 1 MG/ML IJ SOLN
1.0000 mg | Freq: Once | INTRAMUSCULAR | Status: AC | PRN
  Administered 2022-09-26: 1 mg via INTRAVENOUS
  Filled 2022-09-26: qty 1

## 2022-09-26 MED ORDER — LIDOCAINE 5 % EX PTCH: 1.0000 | MEDICATED_PATCH | CUTANEOUS | 0 refills | Status: DC

## 2022-09-26 MED ORDER — SODIUM CHLORIDE (PF) 0.9 % IJ SOLN
INTRAMUSCULAR | Status: AC
  Filled 2022-09-26: qty 50

## 2022-09-26 MED ORDER — CEPHALEXIN 500 MG PO CAPS: 500.0000 mg | ORAL_CAPSULE | Freq: Two times a day (BID) | ORAL | 0 refills | Status: AC

## 2022-09-26 NOTE — ED Provider Notes (Signed)
[facility] AT [facility] Provider Note   CSN: [id number] Arrival date & time: 09/26/22  1047     History  Chief Complaint  Patient presents with   Back Pain    Back pain worsening over the last few weeks. Oncologist concerned about metastasis     Meghan Alexander is a 67 y.o. female.  With PMH of stage III/IV follicular lymphoma in remission, DM2, HTN, GERD who presents with back pain.  Patient complains of upper back pain ongoing for the past 2 to 2-1/2 weeks.  She says she was out in the garden about 2 weeks ago and then the next day woke up with throbbing severe worsening back pain.  She is complaining of ongoing back pain has been worsening in nature over the past 2-1/2 weeks.  She does not feel like it is in her chest or her belly radiating to the back.  It is worse with any movement.  She complains of some tingling in bilateral upper extremities.  But does not feel like pain is radiating down her back into the arms.  She has had no numbness, no tingling, no loss sensation in the legs or groin.  She has had no loss of bladder or bowels.  Notes ongoing diarrhea diagnosed with recent colitis.  She has had no recent falls or traumatic injuries.  No fevers or vomiting.  No urinary pain, hematuria or polyuria.  Was taking Mobic for pain relief but her doctor told her to stop taking.  She is not on any blood thinners.   Back Pain      Home Medications Prior to Admission medications   Medication Sig Start Date End Date Taking? Authorizing Provider  cephALEXin (KEFLEX) 500 MG capsule Take 1 capsule (500 mg total) by mouth 2 (two) times daily for 5 days. 09/26/22 10/01/22 Yes Mardene Sayer, MD  lidocaine (LIDODERM) 5 % Place 1 patch onto the skin daily. Remove & Discard patch within 12 hours or as directed by MD 09/26/22  Yes Mardene Sayer, MD  ondansetron (ZOFRAN-ODT) 4 MG disintegrating tablet Take 1 tablet (4 mg total) by mouth every 8 (eight) hours as  needed for nausea or vomiting. 09/26/22  Yes Mardene Sayer, MD  oxyCODONE (ROXICODONE) 5 MG immediate release tablet Take 1 tablet (5 mg total) by mouth every 6 (six) hours as needed for up to 10 doses for severe pain. 09/26/22  Yes Mardene Sayer, MD  Accu-Chek Softclix Lancets lancets Use to test blood sugars up to 4 times daily as directed. 08/09/21   Drema Dallas, MD  BENADRYL ALLERGY 25 MG tablet Take 25 mg by mouth at bedtime.    [provider]  Blood Glucose Monitoring Suppl (BLOOD GLUCOSE MONITOR SYSTEM) w/Device KIT Use to test blood sugars up to 4 times daily. 08/09/21   Drema Dallas, MD  Continuous Blood Gluc Sensor (FREESTYLE LIBRE 2 SENSOR) MISC Inject 1 Device into the skin every 14 (fourteen) days.    [provider]  CYLTEZO-CD/UC/HS STARTER 40 MG/0.8ML AJKT Inject 160 mLs into the skin every 14 (fourteen) days. Started 09/10/2022. 09/09/22   [provider]  diltiazem (CARDIZEM CD) 240 MG 24 hr capsule Take 1 capsule (240 mg total) by mouth daily. 10/11/21   Littie Deeds, MD  fluticasone (FLONASE) 50 MCG/ACT nasal spray Place 2 sprays into both nostrils in the morning.    [provider]  glucose blood (ACCU-CHEK GUIDE) test strip Use to  test blood sugars up to 4 times daily as directed. 08/09/21   Drema Dallas, MD  HUMALOG KWIKPEN 100 UNIT/ML KwikPen Inject 5 Units into the skin with breakfast, with lunch, and with evening meal. Patient taking differently: Inject 15 Units into the skin See admin instructions. Inject 15 units into the skin before breakfast and an additional 15 units once a day as needed for elevated BGL 10/11/21   Littie Deeds, MD  insulin detemir (LEVEMIR) 100 UNIT/ML FlexPen Inject 10 Units into the skin daily. Patient taking differently: Inject 20 Units into the skin See admin instructions. Inject 20 units into the skin in the morning as needed for elevated BGL 10/11/21   Littie Deeds, MD  Insulin Pen Needle 32G X 4 MM  MISC Use to inject insulin up to 4 times daily as directed. 08/09/21   Drema Dallas, MD  losartan (COZAAR) 50 MG tablet Take 25 mg by mouth in the morning.    [provider]  Melatonin 10 MG TABS Take 10 mg by mouth at bedtime.    [provider]  metFORMIN (GLUCOPHAGE) 500 MG tablet Take 500 mg by mouth daily with breakfast. 07/23/19   [provider]  metoprolol succinate (TOPROL-XL) 25 MG 24 hr tablet Take 1 tablet (25 mg total) by mouth daily. 10/12/21   Littie Deeds, MD  Multiple Vitamin (MULTIVITAMIN) tablet Take 1 tablet by mouth daily with breakfast.    [provider]  nystatin (MYCOSTATIN) 100000 UNIT/ML suspension Take 5 mLs (500,000 Units total) by mouth 4 (four) times daily. 09/26/22   Johney Maine, MD  omeprazole (PRILOSEC) 20 MG capsule Take 20 mg by mouth daily before breakfast. 11/28/20   [provider]  predniSONE (DELTASONE) 10 MG tablet Take 20 mg by mouth daily. 09/07/22   [provider]  rosuvastatin (CRESTOR) 10 MG tablet Take 10 mg by mouth daily.    [provider]  saccharomyces boulardii (FLORASTOR) 250 MG capsule Take 250 mg by mouth in the morning.    [provider]  TURMERIC PO Take 1 capsule by mouth at bedtime.    [provider]  venlafaxine XR (EFFEXOR-XR) 37.5 MG 24 hr capsule Take 37.5 mg by mouth daily with breakfast.    [provider]      Allergies    Latex, Codeine, Atenolol, Other, Hydrocodone, and Oxycodone    Review of Systems   Review of Systems  Musculoskeletal:  Positive for back pain.    Physical Exam Updated Vital Signs BP (!) 171/104   Pulse 97   Temp (!) 97.5 F (36.4 C) (Oral)   Resp 17   Ht 5\' 2"  (1.575 m)   Wt 80.5 kg   SpO2 96%   BMI 32.46 kg/m  Physical Exam Constitutional: Alert and oriented.  Slightly uncomfortable but nontoxic Eyes: Conjunctivae are normal. ENT      Head: Normocephalic and atraumatic. Cardiovascular: S1,  S2, tachycardic, normal and symmetric distal pulses are present in all extremities.Warm and well perfused. Respiratory: Normal respiratory effort. Breath sounds are normal.  O2 sat 100 on RA Gastrointestinal: Mildly distended but soft and nontender, no CVAT Musculoskeletal: Normal range of motion in all extremities.  Tenderness to palpation of the mid thoracic midline spine.      Right lower leg: No tenderness or edema.      Left lower leg: No tenderness or edema. Neurologic: Normal speech and language.  5 out of 5 strength bilateral upper and lower extremity  strength.  Full hip flexor strength knee extensor strength, foot dorsiflexion.  Sensation grossly intact throughout bilateral upper and lower extremities.  Reported paresthesias in upper arms.  No gross focal neurologic deficits are appreciated. Skin: Skin is warm, dry  Psychiatric: Mood and affect are normal. Speech and behavior are normal.  ED Results / Procedures / Treatments   Labs (all labs ordered are listed, but only abnormal results are displayed) Labs Reviewed  BASIC METABOLIC PANEL - Abnormal; Notable for the following components:      Result Value   Glucose, Bld 150 (*)    BUN 24 (*)    Creatinine, Ser 1.03 (*)    GFR, Estimated 59 (*)    All other components within normal limits  CBC WITH DIFFERENTIAL/PLATELET - Abnormal; Notable for the following components:   Monocytes Absolute 1.1 (*)    Abs Immature Granulocytes 0.28 (*)    All other components within normal limits  URINALYSIS, ROUTINE W REFLEX MICROSCOPIC - Abnormal; Notable for the following components:   Leukocytes,Ua MODERATE (*)    Bacteria, UA RARE (*)    All other components within normal limits    EKG EKG Interpretation  Date/Time:  Monday September 26 2022 14:51:45 EDT Ventricular Rate:  95 PR Interval:  168 QRS Duration: 86 QT Interval:  367 QTC Calculation: 462 R Axis:   61 Text Interpretation: Sinus rhythm No significant change since last tracing  Confirmed by Vivien Rossetti ([id number]) on 09/26/2022 2:53:22 PM  Radiology CT T-SPINE NO CHARGE  Result Date: 09/26/2022 CLINICAL DATA:  Back pain EXAM: CT THORACIC SPINE WITHOUT CONTRAST TECHNIQUE: Multidetector CT images of the thoracic were obtained using the standard protocol without intravenous contrast. RADIATION DOSE REDUCTION: This exam was performed according to the departmental dose-optimization program which includes automated exposure control, adjustment of the mA and/or kV according to patient size and/or use of iterative reconstruction technique. COMPARISON:  Chest radiograph done on 08/03/2022 FINDINGS: Alignment: Alignment of posterior margins of vertebral bodies is within normal limits. There is S shaped scoliosis in thoracic spine. Vertebrae: There is 50-60% decrease in height of body of T8 vertebra which was not evident in the chest radiograph done on 08/03/2022 suggesting recent compression. There is 30-40% decrease in height of body of T12 vertebra, more so on the left side with possible interval worsening in comparison with the previous chest radiographs. Paraspinal and other soft tissues: Thoracic esophagus is distended with fluid in the lumen. This may suggest achalasia are chronic reflux or stricture at the gastroesophageal junction. There is inhomogeneous attenuation in thyroid with scattered nodules measuring up to 17 mm in diameter. Disc levels: There is no central spinal stenosis. There is no significant narrowing of neural foramina. IMPRESSION: There is 50-60% decrease in height of body of T8 vertebra suggesting recent compression fracture. There is 30-40% narrowing of upper endplate of body of T12 vertebra with possible interval worsening suggesting acute on chronic compression. Follow-up MRI as clinically warranted should be considered. There is no central spinal stenosis or significant narrowing of neural foramina in the thoracic spine. There is distention of the thoracic esophagus  with air-fluid level in the lumen. This may suggest achalasia or stricture in the distal common bile duct or marked chronic gastroesophageal reflux. There are multiple nodules in thyroid measuring up to 17 mm in diameter. Recommend non-emergent thyroid ultrasound.Reference: J Am Coll Radiol. 2015 Feb;12(2): 143-50 Electronically Signed   By: Ernie Avena M.D.   On: 09/26/2022 14:45   CT Angio  Chest/Abd/Pel for Dissection W and/or Wo Contrast  Result Date: 09/26/2022 CLINICAL DATA:  History of follicular lymphoma with upper back pain and tachycardia EXAM: CT ANGIOGRAPHY CHEST, ABDOMEN AND PELVIS TECHNIQUE: Non-contrast CT of the chest was initially obtained. Multidetector CT imaging through the chest, abdomen and pelvis was performed using the standard protocol during bolus administration of intravenous contrast. Multiplanar reconstructed images and MIPs were obtained and reviewed to evaluate the vascular anatomy. RADIATION DOSE REDUCTION: This exam was performed according to the departmental dose-optimization program which includes automated exposure control, adjustment of the mA and/or kV according to patient size and/or use of iterative reconstruction technique. CONTRAST:  OMNIPAQUE IOHEXOL 350 MG/ML SOLN COMPARISON:  CT abdomen and pelvis dated 03/08/2022, CT chest dated 01/26/2019 CTA chest dated 10/08/2021 FINDINGS: CTA CHEST FINDINGS Cardiovascular: Preferential opacification of the thoracic aorta. No evidence of thoracic aortic aneurysm or dissection. Normal heart size. No pericardial effusion. Anatomic variant common origin of the brachiocephalic and common carotid arteries. No pulmonary emboli. Mediastinum/Nodes: Imaged thyroid gland without nodules meeting criteria for imaging follow-up by size. Patulous, fluid-filled esophagus is again seen. No pathologically enlarged axillary, supraclavicular, mediastinal, or hilar lymph nodes. Lungs/Pleura: The central airways are patent. New medial left  apical irregular nodule measuring 1.0 x 0.4 cm (6:29). Linear atelectasis/scarring along the anteromedial left upper lobe. Bilateral lower lobe subpleural band-like ground-glass opacities, likely atelectasis. Unchanged 8 x 6 mm lingular nodule (6:82). No specific follow-up imaging recommended. No pneumothorax. No pleural effusion. Musculoskeletal: No acute or abnormal lytic or blastic osseous lesions. T8 and T12 compression deformities are better evaluated on same day CT thoracic spine. Review of the MIP images confirms the above findings. CTA ABDOMEN AND PELVIS FINDINGS VASCULAR Aorta: Aortic atherosclerosis. Normal caliber aorta without aneurysm, dissection, vasculitis or significant stenosis. Celiac: Patent without evidence of aneurysm, dissection, vasculitis or significant stenosis. SMA: Patent without evidence of aneurysm, dissection, vasculitis or significant stenosis. Renals: Both renal arteries are patent without evidence of aneurysm, dissection, vasculitis, fibromuscular dysplasia or significant stenosis. IMA: Patent without evidence of aneurysm, dissection, vasculitis or significant stenosis. Inflow: Patent without evidence of aneurysm, dissection, vasculitis or significant stenosis. Proximal Outflow: Bilateral common femoral and visualized portions of the superficial and profunda femoral arteries are patent without evidence of aneurysm, dissection, vasculitis or significant stenosis. Veins: No obvious venous abnormality within the limitations of this arterial phase study. Review of the MIP images confirms the above findings. NON-VASCULAR Hepatobiliary: Unchanged segment 6 cyst. No intra or extrahepatic biliary ductal dilation. Normal gallbladder. Pancreas: No focal lesions or main ductal dilation. Spleen: Normal in size without focal abnormality. Scattered calcified granulomas. Adrenals/Urinary Tract: No adrenal nodules. No suspicious renal mass, calculi or hydronephrosis. No focal bladder wall  thickening. Stomach/Bowel: Normal appearance of the stomach. No evidence of bowel wall thickening, distention, or inflammatory changes. Normal appendix. Lymphatic: No enlarged abdominal or pelvic lymph nodes. Reproductive: No adnexal masses. Other: No free fluid, fluid collection, or free air. Musculoskeletal: No acute or abnormal lytic or blastic osseous lesions. Small fat-containing paraumbilical hernia. Review of the MIP images confirms the above findings. IMPRESSION: 1. No evidence of aortic aneurysm or dissection. T8 and T12 compression deformities are better evaluated on same day CT thoracic spine. 2. No pulmonary emboli. 3. New medial left apical irregular nodule measuring 1.0 x 0.4 cm, likely infectious/inflammatory. Consider non-contrast chest CT in 6-12 months to ensure resolution. 4. Persistent patulous, fluid-filled esophagus. 5.  Aortic Atherosclerosis (ICD10-I70.0). Electronically Signed   By: Milus Height.D.  On: 09/26/2022 14:41   CT L-SPINE NO CHARGE  Result Date: 09/26/2022 CLINICAL DATA:  Back pain EXAM: CT LUMBAR SPINE WITHOUT CONTRAST TECHNIQUE: Multidetector CT imaging of the lumbar spine was performed without intravenous contrast administration. Multiplanar CT image reconstructions were also generated. RADIATION DOSE REDUCTION: This exam was performed according to the departmental dose-optimization program which includes automated exposure control, adjustment of the mA and/or kV according to patient size and/or use of iterative reconstruction technique. COMPARISON:  CT abdomen and pelvis done on 03/08/2022 FINDINGS: Segmentation: There are 5 non-rib-bearing vertebrae and lumbar spine Alignment: There is minimal 1-2 mm anterolisthesis at L4-L5 level. Vertebrae: No recent fracture is seen in the lumbar spine. There is mild to moderate decrease in height of upper endplate of body of T12 vertebra which was not evident in the previous CT abdomen done on 03/08/2022. Possible hemangiomas are noted  in the bodies of L3 and L4 vertebrae with coarse trabeculae. Paraspinal and other soft tissues: Paraspinal soft tissues are unremarkable. There are scattered calcifications and atherosclerotic plaques in aorta. Disc levels: There is mild encroachment of neural foramina by bulging of the annulus at L3-L4 and L4-L5 levels. There is no central spinal stenosis. IMPRESSION: No recent fracture is seen in lumbar spine. There is 30-40% recent compression in the body of T12 vertebra. There is no central spinal stenosis. There is mild encroachment of neural foramina by bulging of the annulus and facet hypertrophy at L3-L4 and L4-L5 levels. There is minimal anterolisthesis at the L4-L5 level. Coarse trabeculae in the bodies of L3 and L4 vertebrae suggest possible hemangiomas. Electronically Signed   By: Ernie Avena M.D.   On: 09/26/2022 14:35    Procedures Procedures    Medications Ordered in ED Medications  HYDROmorphone (DILAUDID) injection 1 mg (1 mg Intravenous Given 09/26/22 1110)  ondansetron (ZOFRAN) injection 4 mg (4 mg Intravenous Given 09/26/22 1110)  HYDROmorphone (DILAUDID) injection 1 mg (1 mg Intravenous Given 09/26/22 1411)  iohexol (OMNIPAQUE) 350 MG/ML injection 100 mL (100 mLs Intravenous Contrast Given 09/26/22 1347)    ED Course/ Medical Decision Making/ A&P                             Medical Decision Making Meghan Alexander is a 67 y.o. female.  With PMH of stage III/IV follicular lymphoma in remission, DM2, HTN, GERD who presents with back pain.  Patient complains of upper back pain ongoing for the past 2 to 2-1/2 weeks.  Patient has point tenderness of the mid and lower thoracic spine which makes me suspect this is likely consistent with compression fracture.  She has no urinary retention confirmed with 0 on PVR.  She has full strength of bilateral upper and lower extremities.  She has no findings concerning for discitis, epidural hematoma, cauda equina.  Of note, she did present  hypertensive and tachycardic which makes me suspect more likely related to pain.  She had no chest pain, shortness of breath or symptoms concerning for hypertensive emergency.  Her EKG was unchanged from prior with no evidence of STEMI.  She did have a CTA dissection scan which I personally reviewed negative for dissection but consistent with T8 and T12 compression fractures consistent with patient's pain.  Patient's pain was controlled in the ER and she was able to ambulate without assistance to the bathroom.  I did discuss with on-call neurosurgeon Dr. Wynetta Emery who recommended TLSO brace and she can follow-up this week in  clinic for further workup and management.  Patient is in agreement with this plan.  I also did discuss incidental findings of new lung nodule and thyroid nodules which she will follow-up with PCP outpatient with as well as discussion of blood pressure control.  Strict return precautions discussed with patient.  Amount and/or Complexity of Data Reviewed Labs: ordered. Radiology: ordered.  Risk Prescription drug management.     Final Clinical Impression(s) / ED Diagnoses Final diagnoses:  Upper back pain  Closed wedge compression fracture of T8 vertebra, initial encounter (HCC)  Compression fracture of T12 vertebra, initial encounter (HCC)  Thyroid nodule  Nodule of left lung  Urinary tract infection without hematuria, site unspecified    Rx / DC Orders ED Discharge Orders          Ordered    oxyCODONE (ROXICODONE) 5 MG immediate release tablet  Every 6 hours PRN        09/26/22 1531    ondansetron (ZOFRAN-ODT) 4 MG disintegrating tablet  Every 8 hours PRN        09/26/22 1531    cephALEXin (KEFLEX) 500 MG capsule  2 times daily        09/26/22 1531    lidocaine (LIDODERM) 5 %  Every 24 hours        09/26/22 1531    Ambulatory referral to Neurosurgery        09/26/22 1531              Mardene Sayer, MD 09/26/22 1540

## 2022-09-26 NOTE — ED Notes (Addendum)
Attempt to d/c patient. Patient reports she is not feeling well. States she is nausea and clammy. BG 233. MD made aware

## 2022-09-26 NOTE — ED Notes (Signed)
Called placed out to Ortho tech. States will come by. ETA unknown at this time

## 2022-09-26 NOTE — ED Notes (Signed)
ED Ortho tech called. States brace is ordered, will take a few hours. Charge nurse aware

## 2022-09-26 NOTE — Discharge Instructions (Addendum)
Your imaging shows evidence of compression fractures in your thoracic spine at T8 and T12.  Wear the brace provided to you whenever you are walking around or for support.  You can remove for showering or sleeping.  Make the follow-up appointment listed with Dr. Wynetta Emery the neurosurgeon in your discharge paperwork.  They should have availability Tuesday and Thursday this week for follow-up and further workup.  Take Tylenol as written on the bottle for pain control.  If pain is still severe, you can use lidocaine patches as prescribed as well as oxycodone for severe pain.  Do not take with alcohol or while driving a vehicle.  As we discussed, your imaging did show some incidental findings including nodules on your thyroid which your primary care can order a follow-up ultrasound for.  You will also need a repeat chest CT scan in the next 6 months to 1 year for a new nodule in the left upper lung.  I have given you a short course of antibiotics for findings suggestive of a mild urinary tract infection.  Take the Keflex as prescribed.  Come back if any severe worsening uncontrolled pain, inability to walk, new numbness or tingling or loss of sensation of the legs, loss of control bladder or bowels, or any other symptoms concerning to you.

## 2022-09-26 NOTE — ED Notes (Addendum)
TLSO brace placed by ortho tech

## 2022-09-26 NOTE — ED Notes (Signed)
Security giving patient a ride to car.

## 2022-09-26 NOTE — ED Notes (Signed)
Patient reports feeling better. Ready for D/C.

## 2022-09-26 NOTE — ED Notes (Signed)
Patient transported to MRI 

## 2022-09-26 NOTE — Progress Notes (Signed)
HEMATOLOGY/ONCOLOGY CLINIC NOTE  Date of Service: 09/26/22   Patient Care Team: Burton Apley, MD as PCP - General (Internal Medicine) Parke Poisson, MD as PCP - Cardiology (Cardiology)  CHIEF COMPLAINTS/PURPOSE OF CONSULTATION:  Follow-up for continued evaluation and management of follicular lymphoma  . 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   INTERVAL HISTORY  Meghan Alexander is a 67 y.o. 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   Patient was last seen by me on 03/28/2022 and she complained of diarrhea, night sweats, lower back pain, and new lump near her left collar bone.  She was hospitalized on 07/28/2022 at the request of her gastroenterologist due to chronic diarrhea. She was diagnosed with inflammatory bowel disease by her gastroenterologist prior hospitalization. Patient tested positive for Norovirus at the hospital. She was discharged on 07/31/2022.   Patient notes she has been doing well overall without any new or severe medical concerns. She notes she has been prescribed Cyltezo injection for her inflammatory bowel disease. She notes that her diarrhea has improved with her Cyltezo injection and Prednisone. She was started on 60 mg Prednisone and was reduced to 40 mg. Patient has discontinued her Prednisone.   During this visit, she complains of diarrhea and middle back pain. She denies any new infection issues, fever, chills, night sweats, abdominal pain, chest pain or leg swelling.   Patient notes she strained her back around 2 weeks ago while  gardening. She notes her back pain worsened last night. She denies back pain radiating to her legs.   She notes her respiratory symptoms have improved since our last visit and she continues to follow-up with her Pulmonologist, Dr. Francine Graven.  Patient notes her diabetes have been controlled with medication.     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   GERD (gastroesophageal reflux disease)    hx of reflux-went away with elevating HOB    Hypertension    Pneumonia    hx of    PONV (postoperative nausea and vomiting)    Type 2 diabetes mellitus (HCC) 08/02/2021    SURGICAL HISTORY: Past Surgical History:  Procedure Laterality Date   ABDOMINAL HYSTERECTOMY     ADENOIDECTOMY     BIOPSY  03/10/2022   Procedure: BIOPSY;  Surgeon: Vida Rigger, MD;  Location: [facility] ENDOSCOPY;  Service: Gastroenterology;;   BRONCHIAL BRUSHINGS  08/03/2021   Procedure: BRONCHIAL BRUSHINGS;  Surgeon: Leslye Peer, MD;  Location: [facility] ENDOSCOPY;  Service: Cardiopulmonary;;   BRONCHIAL WASHINGS  08/03/2021   Procedure: BRONCHIAL WASHINGS;  Surgeon: Leslye Peer, MD;  Location: [facility] ENDOSCOPY;  Service: Cardiopulmonary;;   cosmetic surgeries     ESOPHAGEAL MANOMETRY N/A 12/02/2015   Procedure: ESOPHAGEAL MANOMETRY (EM);  Surgeon: Willis Modena, MD;  Location: [facility] ENDOSCOPY;  Service: Endoscopy;  Laterality: N/A;   ESOPHAGOGASTRODUODENOSCOPY N/A 12/02/2015   Procedure: ESOPHAGOGASTRODUODENOSCOPY (EGD);  Surgeon: Willis Modena, MD;  Location: [facility] ENDOSCOPY;  Service: Endoscopy;  Laterality: N/A;   FLEXIBLE SIGMOIDOSCOPY N/A 03/10/2022   Procedure: FLEXIBLE SIGMOIDOSCOPY;  Surgeon: Vida Rigger, MD;  Location: [facility] ENDOSCOPY;  Service: Gastroenterology;  Laterality: N/A;   lymph  node removal left leg     cat scratch surgery    ORIF ANKLE FRACTURE Right 09/03/2021   Procedure: OPEN REDUCTION INTERNAL FIXATION (ORIF) ANKLE FRACTURE;  Surgeon: Joen Laura, MD;  Location: [facility] OR;  Service: Orthopedics;  Laterality: Right;   THROAT SURGERY     sleep apnea surgery    TONSILLECTOMY     VIDEO BRONCHOSCOPY  08/03/2021   Procedure: VIDEO BRONCHOSCOPY WITH FLUORO;  Surgeon: Leslye Peer, MD;  Location: [facility] ENDOSCOPY;  Service: Cardiopulmonary;;    SOCIAL HISTORY: Social History   Socioeconomic History   Marital status: Married    Spouse name: Not on file   Number of children: Not on file   Years of education: Not on file   Highest education level: Not on file  Occupational History   Not on file  Tobacco Use   Smoking status: Never   Smokeless tobacco: Never  Vaping Use   Vaping Use: Never used  Substance and Sexual Activity   Alcohol use: No    Alcohol/week: 0.0 standard drinks of alcohol   Drug use: No   Sexual activity: Not on file  Other Topics Concern   Not on file  Social History Narrative   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, newspaper at [employer]   Never smoker never drinker   No drugs   Multiple allergies   Lives at home with her husband of 8 years since 2008   Social Determinants of Health   Financial Resource Strain: Not on file  Food Insecurity: Not on file  Transportation Needs: Not on file  Physical Activity: Not on file  Stress: Not on file  Social Connections: Not on file  Intimate Partner Violence: Not on file    FAMILY HISTORY: Family History  Problem Relation Age of Onset   Lung cancer Father        1982 died from it   Hernia Mother    Breast cancer Maternal Aunt        multiple    Breast cancer Maternal Grandmother    Breast cancer Paternal Grandmother    Breast cancer Cousin        cousins multiple    Colon cancer Neg Hx    Colon polyps Neg Hx    Rectal cancer Neg Hx    Stomach cancer Neg  Hx     ALLERGIES:  is allergic to latex, codeine, atenolol, other, hydrocodone, and oxycodone.  MEDICATIONS:  Current Outpatient Medications  Medication Sig Dispense Refill   Accu-Chek Softclix Lancets lancets Use to test blood sugars up to 4 times daily as directed. 100 each 0   Blood Glucose Monitoring Suppl (BLOOD GLUCOSE MONITOR SYSTEM) w/Device KIT Use to test blood sugars up to 4 times daily. 1 kit 0   budesonide (ENTOCORT EC) 3 MG 24 hr capsule Take 3 capsules (9 mg total) by mouth daily for 28 days, THEN 2 capsules (6 mg total) daily for 14 days, THEN 1 capsule (3 mg total) daily for 14 days. 126 capsule 0   diltiazem (CARDIZEM CD) 240 MG 24 hr capsule Take 1 capsule (240 mg total) by mouth daily.  30 capsule 0   ferrous sulfate 325 (65 FE) MG tablet Take 1 tablet (325 mg total) by mouth every other day. (Patient not taking: Reported on 03/07/2022) 30 tablet 0   fluticasone-salmeterol (ADVAIR HFA) 115-21 MCG/ACT inhaler Inhale 2 puffs into the lungs 2 (two) times daily. (Patient taking differently: Inhale 2 puffs into the lungs daily.) 1 each 12   glucose blood (ACCU-CHEK GUIDE) test strip Use to test blood sugars up to 4 times daily as directed. 100 each 0   HUMALOG KWIKPEN 100 UNIT/ML KwikPen Inject 5 Units into the skin with breakfast, with lunch, and with evening meal. (Patient taking differently: Inject 15 Units into the skin 2 (two) times daily.) 15 mL 11   insulin detemir (LEVEMIR) 100 UNIT/ML FlexPen Inject 10 Units into the skin daily. 15 mL 0   Insulin Pen Needle 32G X 4 MM MISC Use to inject insulin up to 4 times daily as directed. 100 each 0   loperamide (IMODIUM) 2 MG capsule Take 1 capsule (2 mg total) by mouth as needed for diarrhea or loose stools. Not to exceed 8 capsules per day 30 capsule 0   losartan (COZAAR) 50 MG tablet Take 25 mg by mouth daily.     meloxicam (MOBIC) 15 MG tablet Take 15 mg by mouth daily.     metFORMIN (GLUCOPHAGE) 500 MG tablet Take 500 mg by  mouth daily with breakfast.     metoprolol succinate (TOPROL-XL) 25 MG 24 hr tablet Take 1 tablet (25 mg total) by mouth daily. 30 tablet 0   Multiple Vitamin (MULTIVITAMIN) tablet Take 1 tablet by mouth daily.     omeprazole (PRILOSEC) 20 MG capsule Take 20 mg by mouth daily.     rosuvastatin (CRESTOR) 10 MG tablet Take 10 mg by mouth daily.     venlafaxine XR (EFFEXOR-XR) 37.5 MG 24 hr capsule Take 37.5 mg by mouth daily with breakfast.     No current facility-administered medications for this visit.    REVIEW OF SYSTEMS: Fevers no chills no night sweats Persistent nonproductive cough. Dyspnea on exertion present  PHYSICAL EXAMINATION:  Vitals:   09/26/22 0929  BP: (!) 155/103  Pulse: (!) 118  Resp: 18  Temp: (!) 97.3 F (36.3 C)  SpO2: 99%   NAD GENERAL:alert, in no acute distress and comfortable SKIN: no acute rashes, no significant lesions EYES: conjunctiva are pink and non-injected, sclera anicteric NECK: supple, no JVD LYMPH:  no palpable lymphadenopathy in the cervical, axillary or inguinal regions LUNGS: clear to auscultation b/l with normal respiratory effort HEART: regular rate & rhythm ABDOMEN:  normoactive bowel sounds , non tender, not distended. Extremity: no pedal edema PSYCH: alert & oriented x 3 with fluent speech NEURO: no focal motor/sensory deficits  LABORATORY DATA:  I have reviewed the data as listed     Latest Ref Rng & Units 10/02/2022    9:52 PM 09/26/2022   11:15 AM 09/26/2022    8:59 AM  CBC  WBC 4.0 - 10.5 K/uL 3.4  5.6  6.6   Hemoglobin 12.0 - 15.0 g/dL 16.1  09.6  04.5   Hematocrit 36.0 - 46.0 % 34.0  41.0  40.6   Platelets 150 - 400 K/uL 202  231  230        Latest Ref Rng & Units 10/02/2022    9:52 PM 09/26/2022   11:15 AM 09/26/2022    8:59 AM  CMP  Glucose 70 - 99 mg/dL 409  811  914  BUN 8 - 23 mg/dL 12  24  23    Creatinine 0.44 - 1.00 mg/dL 1.61  0.96  0.45   Sodium 135 - 145 mmol/L 141  140  140   Potassium 3.5 - 5.1 mmol/L 3.6   4.4  4.6   Chloride 98 - 111 mmol/L 100  102  102   CO2 22 - 32 mmol/L 27  26  30    Calcium 8.9 - 10.3 mg/dL 8.8  9.1  9.6   Total Protein 6.5 - 8.1 g/dL   6.5   Total Bilirubin 0.3 - 1.2 mg/dL   0.4   Alkaline Phos 38 - 126 U/L   119   AST 15 - 41 U/L   37   ALT 0 - 44 U/L   60    . Lab Results  Component Value Date   LDH 457 (H) 09/26/2022   05/14/2019 CT ANGIO CHEST PE W OR WO CONTRAST (Accession [id number]):   03/08/2019 Lymph Node Biopsy [phone]):      RADIOGRAPHIC STUDIES: I have personally reviewed the radiological images as listed and agreed with the findings in the report. CT ABDOMEN PELVIS W CONTRAST  Result Date: 03/08/2022 CLINICAL DATA:  Abdominal pain, acute, nonlocalized worsening abdominal pain, swelling, recent colitis EXAM: CT ABDOMEN AND PELVIS WITH CONTRAST TECHNIQUE: Multidetector CT imaging of the abdomen and pelvis was performed using the standard protocol following bolus administration of intravenous contrast. RADIATION DOSE REDUCTION: This exam was performed according to the departmental dose-optimization program which includes automated exposure control, adjustment of the mA and/or kV according to patient size and/or use of iterative reconstruction technique. CONTRAST:  75mL OMNIPAQUE IOHEXOL 350 MG/ML SOLN COMPARISON:  CT 03/01/2022 FINDINGS: Lower chest: Bibasilar subsegmental atelectasis. No acute findings. Bilateral breast implants noted. Hepatobiliary: Calcified granuloma in the a padded dome. Small hepatic cysts. The gallbladder is unremarkable. Pancreas: Unremarkable. No pancreatic ductal dilatation or surrounding inflammatory changes. Spleen: Multiple calcified granulomas. Adrenals/Urinary Tract: Adrenal glands are unremarkable. No hydronephrosis or nephrolithiasis. The bladder is mildly distended. Stomach/Bowel: There is diffuse colonic wall thickening with adjacent stranding, worsened in comparison to recent CT. There is mild distension of the  appendix though with intraluminal gas, likely associated with fluid distension of the cecum and ascending colon. There is a gas fluid level in the ascending colon. There is no evidence of bowel obstruction. There is no pneumatosis. Vascular/Lymphatic: Scattered atherosclerosis. No AAA. No lymphadenopathy. Reproductive: Prior hysterectomy. Other: Tiny fat containing umbilical hernia. No bowel containing hernia. No ascites. No free air. Subcutaneous gas in the lower anterior abdominal wall likely related due medication injection. There are a few prominent reactive mesenteric lymph nodes. Musculoskeletal: No acute osseous abnormality. No suspicious osseous lesion. Multilevel degenerative changes of the spine. There is a vertebral body hemangioma in L3 and L4. There is bilateral hip osteoarthritis. IMPRESSION: Diffuse colonic wall thickening with adjacent stranding, increased in comparison to recent CT, consistent with worsening colitis. Electronically Signed   By: Caprice Renshaw M.D.   On: 03/08/2022 14:22   DG Abd 1 View  Result Date: 03/08/2022 CLINICAL DATA:  67 year old female with history of diarrhea. EXAM: ABDOMEN - 1 VIEW COMPARISON:  Chest x-ray 02/28/2022. FINDINGS: There is relative paucity of bowel gas throughout the abdomen, with the exception of a prominent loop of gas-filled bowel in the upper abdomen, which appears to represent the transverse colon, measuring up to 7.2 cm in diameter. No distal rectal gas is noted. No definite pneumoperitoneum. IMPRESSION: 1. Unusual but nonobstructive  bowel gas pattern, as above. 2. No pneumoperitoneum. Electronically Signed   By: Trudie Reed M.D.   On: 03/08/2022 06:29   US RENAL  Result Date: 03/08/2022 CLINICAL DATA:  67 year old female with history of acute kidney injury. EXAM: RENAL / URINARY TRACT ULTRASOUND COMPLETE COMPARISON:  No prior ultrasound. CT of the abdomen and pelvis 03/01/2022. FINDINGS: Right Kidney: Renal measurements: 11.1 x 5.0 x 5.4 =  volume: 156.3 mL. Echogenicity within normal limits. No mass or hydronephrosis visualized. Left Kidney: Renal measurements: 11.8 x 6.1 x 5.5 = volume: 205.1 mL. Echogenicity within normal limits. No mass or hydronephrosis visualized. Bladder: Appears normal for degree of bladder distention. Other: None. IMPRESSION: 1. No acute findings. Specifically, no hydroureteronephrosis. Sonographic appearance of the kidneys is within normal limits. Electronically Signed   By: Trudie Reed M.D.   On: 03/08/2022 05:17   CT ABDOMEN PELVIS W CONTRAST  Result Date: 03/01/2022 CLINICAL DATA:  Diarrhea with nausea and vomiting. EXAM: CT ABDOMEN AND PELVIS WITH CONTRAST TECHNIQUE: Multidetector CT imaging of the abdomen and pelvis was performed using the standard protocol following bolus administration of intravenous contrast. RADIATION DOSE REDUCTION: This exam was performed according to the departmental dose-optimization program which includes automated exposure control, adjustment of the mA and/or kV according to patient size and/or use of iterative reconstruction technique. CONTRAST:  75mL OMNIPAQUE IOHEXOL 350 MG/ML SOLN COMPARISON:  07/14/2021. FINDINGS: Lower chest: Mild atelectasis or scarring at the lung bases. Hepatobiliary: Cysts and calcified granuloma are noted in the liver. No biliary ductal dilatation. The gallbladder is without stones. Pancreas: Unremarkable. No pancreatic ductal dilatation or surrounding inflammatory changes. Spleen: Normal size.  Calcified granuloma are noted. Adrenals/Urinary Tract: Adrenal glands are stable. The kidneys enhance symmetrically. No renal calculus or hydronephrosis. The bladder is unremarkable. Stomach/Bowel: In the distal esophagus is patulous and unchanged from the prior exam. Stomach is within normal limits. Appendix appears normal. No evidence of bowel wall thickening, distention, or inflammatory changes. No free air or pneumatosis. A few scattered diverticula are noted  along the colon without evidence of diverticulitis. There is mild diffuse colonic wall thickening with enhancement. Vascular/Lymphatic: Aortic atherosclerosis. No enlarged abdominal or pelvic lymph nodes. Reproductive: Status post hysterectomy. No adnexal masses. Other: Small fat containing periumbilical hernia. No abdominopelvic ascites. Musculoskeletal: Bilateral breast implants are noted. There are degenerative changes in the thoracolumbar spine. No acute osseous abnormality. IMPRESSION: 1. Mild diffuse colonic wall thickening and mucosal enhancement, possible infectious or inflammatory colitis. 2. Diverticulosis without diverticulitis. 3. Aortic atherosclerosis. Electronically Signed   By: Thornell Sartorius M.D.   On: 03/01/2022 02:01   DG Abdomen 1 View  Result Date: 02/28/2022 CLINICAL DATA:  Vomiting and watery diarrhea. EXAM: ABDOMEN - 1 VIEW COMPARISON:  None Available. FINDINGS: The bowel gas pattern is normal. Radiopaque surgical clips are seen overlying the medial aspect of the left upper quadrant. No radio-opaque calculi or other significant radiographic abnormality are seen. IMPRESSION: Nonobstructive bowel gas pattern. Electronically Signed   By: Aram Candela M.D.   On: 02/28/2022 21:26    ASSESSMENT & PLAN:   #1 Stage III/IV follicular lymphoma - likely low grade but accurate grading difficult  11/02/2018 MRI lumbar spine wo contrast revealed "Extensive retroperitoneal lymphadenopathy highly worrisome for neoplastic process such as lymphoproliferative disease. Spondylosis worst at L4-5 where there is narrowing in the right subarticular recess predominantly due to a synovial cyst off the medial margin of the right facet with encroachment on the descending right L5 root. There is mild  central canal narrowing overall at this Level."  12/04/2018 CT CAP revealed "Mild-to-moderate abdominal and pelvic lymphadenopathy and new 9 mm left superior mediastinal lymph node, highly suspicious for  lymphoproliferative disorder. Stable 8 mm lingular pulmonary nodule, consistent with benign Etiology. Moderate hepatic steatosis. 1.3 cm indeterminate low-attenuation lesion in the right lobe. Recommend continued attention on follow-up CT."  12/18/2018 lymph node biopsy pathology revealing a follicular lymphoma- likely low grade but accurate grading not possible on limited sampling.  02/06/2019 PET/CT scan ([id number]) revealed "1. Bulky hypermetabolic lymphadenopathy in the abdomen and upper pelvis, as described. This is associated with mild hypermetabolic lymphadenopathy in the upper mediastinum and right axilla. Hypermetabolic lymphadenopathy represents a combination of Deauville 4 and Deauville 5 disease. 2. 8 mm nodule in the lingula without hypermetabolism. Attention on follow-up recommended. 3. Hepatic steatosis. 4.  Aortic Atherosclerois (ICD10-170.0)."  03/08/2019 Lymph Node Biopsy ([id number]) revealed "LYMPH NODE, NEEDLE CORE BIOPSY: - Follicular lymphoma (grade 1-2 of 3) with elevated proliferation rate."  2. Mild treatment related thrombocytopenia- resolved.   3.  H/o Melena.  2 episodes about a month ago.  Hemoglobin stable today. Previously had upper abdominal discomfort which has resolved. Has some chronic nausea due to achalasia.  4.  Chronic ALT elevation -likely from her fatty liver. Plan -Follow-up with PCP and GI for management of hepatic steatosis/steatohepatitis. -Avoid hepatotoxic medications. -Recommended optimal diet and exercise and to optimize control of diabetes with PCP.  5. DM2 -following with PCP  6.  Bilateral Extensive multifocal groundglass opacities with interstitial thickening./Interstitial lung disease following with Dr. Francine Graven from pulmonary. Currently on steroid taper and is off home oxygen.  Plan: -Patient has no clinical symptoms suggestive of progression of her follicular lymphoma at this time. -Discussed lab results from today, 09/26/2022,  with the patient. CBC is stable. CMP shows blood glucose level at 159, slightly elevated creatinine at 1.04, and elevated ALT at 60.  -Discussed to use tylenol not meloxicam for pain management.  -Will prescribe Nystatin for thrust.  -Discuss the option of getting a scan for localized back pain.  -Patient will be admitted to ED for her localized back pain after our visit. Recommend to get an MRI back and possible CT of thoracic lumbar spine regarding back pain.  -Continue to follow up with gastroenterologist and pulmonologist.   FOLLOW-UP: Referral to ED for severe thoracic spine pain and TTP RTC with Dr Candise Che with labs in 6 months   The total time spent in the appointment was 30 minutes* .  All of the patient's questions were answered with apparent satisfaction. The patient knows to call the clinic with any problems, questions or concerns.   Wyvonnia Lora MD MS AAHIVMS [facility] Hematology/Oncology Physician [facility]  .*Total Encounter Time as defined by the Centers for Medicare and Medicaid Services includes, in addition to the face-to-face time of a patient visit (documented in the note above) non-face-to-face time: obtaining and reviewing outside history, ordering and reviewing medications, tests or procedures, care coordination (communications with other health care professionals or caregivers) and documentation in the medical record.   I, Ok Edwards, am acting as a Neurosurgeon for Wyvonnia Lora, MD. .I have reviewed the above documentation for accuracy and completeness, and I agree with the above. Johney Maine MD

## 2022-09-27 ENCOUNTER — Other Ambulatory Visit: Payer: Self-pay | Admitting: Hematology

## 2022-09-28 ENCOUNTER — Telehealth: Payer: Self-pay | Admitting: Hematology

## 2022-09-29 ENCOUNTER — Other Ambulatory Visit: Payer: Self-pay

## 2022-10-02 ENCOUNTER — Emergency Department (HOSPITAL_COMMUNITY)
Admission: EM | Admit: 2022-10-02 | Discharge: 2022-10-03 | Disposition: A | Discharge status: Home / Self Care | Payer: Medicare Other | Source: Home / Self Care | Attending: Emergency Medicine | Admitting: Emergency Medicine

## 2022-10-02 ENCOUNTER — Emergency Department (HOSPITAL_COMMUNITY): Payer: Medicare Other

## 2022-10-02 ENCOUNTER — Other Ambulatory Visit: Payer: Self-pay

## 2022-10-02 DIAGNOSIS — Z7984 Long term (current) use of oral hypoglycemic drugs: Secondary | ICD-10-CM | POA: Insufficient documentation

## 2022-10-02 DIAGNOSIS — Z91040 Latex allergy status: Secondary | ICD-10-CM | POA: Insufficient documentation

## 2022-10-02 DIAGNOSIS — R0602 Shortness of breath: Secondary | ICD-10-CM | POA: Insufficient documentation

## 2022-10-02 DIAGNOSIS — M549 Dorsalgia, unspecified: Secondary | ICD-10-CM | POA: Diagnosis not present

## 2022-10-02 DIAGNOSIS — R600 Localized edema: Secondary | ICD-10-CM | POA: Insufficient documentation

## 2022-10-02 DIAGNOSIS — E119 Type 2 diabetes mellitus without complications: Secondary | ICD-10-CM | POA: Insufficient documentation

## 2022-10-02 DIAGNOSIS — X58XXXD Exposure to other specified factors, subsequent encounter: Secondary | ICD-10-CM | POA: Insufficient documentation

## 2022-10-02 DIAGNOSIS — R000 Tachycardia, unspecified: Secondary | ICD-10-CM | POA: Insufficient documentation

## 2022-10-02 DIAGNOSIS — S22060D Wedge compression fracture of T7-T8 vertebra, subsequent encounter for fracture with routine healing: Secondary | ICD-10-CM | POA: Insufficient documentation

## 2022-10-02 DIAGNOSIS — M4854XA Collapsed vertebra, not elsewhere classified, thoracic region, initial encounter for fracture: Secondary | ICD-10-CM | POA: Diagnosis not present

## 2022-10-02 DIAGNOSIS — R202 Paresthesia of skin: Secondary | ICD-10-CM | POA: Insufficient documentation

## 2022-10-02 DIAGNOSIS — Z794 Long term (current) use of insulin: Secondary | ICD-10-CM | POA: Insufficient documentation

## 2022-10-02 LAB — URINALYSIS, ROUTINE W REFLEX MICROSCOPIC
Bilirubin Urine: NEGATIVE
Glucose, UA: 50 mg/dL — AB
Hgb urine dipstick: NEGATIVE
Ketones, ur: NEGATIVE mg/dL
Leukocytes,Ua: NEGATIVE
Nitrite: NEGATIVE
Protein, ur: NEGATIVE mg/dL
Specific Gravity, Urine: 1.016 (ref 1.005–1.030)
pH: 5 (ref 5.0–8.0)

## 2022-10-02 LAB — BASIC METABOLIC PANEL
Anion gap: 14 (ref 5–15)
BUN: 12 mg/dL (ref 8–23)
CO2: 27 mmol/L (ref 22–32)
Calcium: 8.8 mg/dL — ABNORMAL LOW (ref 8.9–10.3)
Chloride: 100 mmol/L (ref 98–111)
Creatinine, Ser: 1.18 mg/dL — ABNORMAL HIGH (ref 0.44–1.00)
GFR, Estimated: 50 mL/min — ABNORMAL LOW (ref >60)
Glucose, Bld: 203 mg/dL — ABNORMAL HIGH (ref 70–99)
Potassium: 3.6 mmol/L (ref 3.5–5.1)
Sodium: 141 mmol/L (ref 135–145)

## 2022-10-02 LAB — CBC
HCT: 34 % — ABNORMAL LOW (ref 36.0–46.0)
Hemoglobin: 10.7 g/dL — ABNORMAL LOW (ref 12.0–15.0)
MCH: 27 pg (ref 26.0–34.0)
MCHC: 31.5 g/dL (ref 30.0–36.0)
MCV: 85.9 fL (ref 80.0–100.0)
Platelets: 202 10*3/uL (ref 150–400)
RBC: 3.96 MIL/uL (ref 3.87–5.11)
RDW: 15.4 % (ref 11.5–15.5)
WBC: 3.4 10*3/uL — ABNORMAL LOW (ref 4.0–10.5)
nRBC: 0 % (ref 0.0–0.2)

## 2022-10-02 MED ORDER — ONDANSETRON HCL 4 MG/2ML IJ SOLN
4.0000 mg | Freq: Once | INTRAMUSCULAR | Status: AC
  Administered 2022-10-03: 4 mg via INTRAVENOUS
  Filled 2022-10-02: qty 2

## 2022-10-02 MED ORDER — HYDROMORPHONE HCL 1 MG/ML IJ SOLN
1.0000 mg | Freq: Once | INTRAMUSCULAR | Status: AC
  Administered 2022-10-03: 1 mg via INTRAVENOUS
  Filled 2022-10-02: qty 1

## 2022-10-02 NOTE — ED Provider Notes (Signed)
[place] EMERGENCY DEPARTMENT AT [facility] Provider Note   CSN: [id number] Arrival date & time: 10/02/22  2131     History  Chief Complaint  Patient presents with   Back Pain    Meghan Alexander is a 67 y.o. female.  The history is provided by the patient and medical records.  Back Pain Meghan Alexander is a 67 y.o. female who presents to the Emergency Department complaining of back pain.  She presents to the emergency department complaining of upper back pain that started about 2-1/2 weeks ago.  Pain is described as stabbing and constant and radiates across her back bilaterally.  She states the only thing that improves her pain is pain medications.  Her pain is worse with movement.  She also reports associated numbness to bilateral upper extremities described as a tingling sensation.  Radiates down from her shoulders to her digits and bilateral arms, right greater than left.  She was evaluated in the emergency department for similar back pain several days ago and was started on hydrocodone.  She did not have good relief with this pain medication and saw her PCP the following day and was started on hydromorphone, which she states gives her improved pain relief.  She does get nausea with the pain medication.  She also reports that shortness of breath as well as bilateral lower extremity edema that started today.  No reported fever, vomiting, abdominal pain, cough.  No hematochezia or melena.   Follicular lymphoma, DM, crohns (recent dx).  Completed steroids three weeks ago.      Home Medications Prior to Admission medications   Medication Sig Start Date End Date Taking? Authorizing Provider  ondansetron (ZOFRAN) 4 MG tablet Take 1 tablet (4 mg total) by mouth every 8 (eight) hours as needed for nausea or vomiting. 10/03/22  Yes Tilden Fossa, MD  Accu-Chek Softclix Lancets lancets Use to test blood sugars up to 4 times daily as directed. 08/09/21   Drema Dallas, MD   BENADRYL ALLERGY 25 MG tablet Take 25 mg by mouth at bedtime.    [provider]  Blood Glucose Monitoring Suppl (BLOOD GLUCOSE MONITOR SYSTEM) w/Device KIT Use to test blood sugars up to 4 times daily. 08/09/21   Drema Dallas, MD  Continuous Blood Gluc Sensor (FREESTYLE LIBRE 2 SENSOR) MISC Inject 1 Device into the skin every 14 (fourteen) days.    [provider]  CYLTEZO-CD/UC/HS STARTER 40 MG/0.8ML AJKT Inject 160 mLs into the skin every 14 (fourteen) days. Started 09/10/2022. 09/09/22   [provider]  diltiazem (CARDIZEM CD) 240 MG 24 hr capsule Take 1 capsule (240 mg total) by mouth daily. 10/11/21   Littie Deeds, MD  fluticasone (FLONASE) 50 MCG/ACT nasal spray Place 2 sprays into both nostrils in the morning.    [provider]  glucose blood (ACCU-CHEK GUIDE) test strip Use to test blood sugars up to 4 times daily as directed. 08/09/21   Drema Dallas, MD  HUMALOG KWIKPEN 100 UNIT/ML KwikPen Inject 5 Units into the skin with breakfast, with lunch, and with evening meal. Patient taking differently: Inject 15 Units into the skin See admin instructions. Inject 15 units into the skin before breakfast and an additional 15 units once a day as needed for elevated BGL 10/11/21   Littie Deeds, MD  insulin detemir (LEVEMIR) 100 UNIT/ML FlexPen Inject 10 Units into the skin daily. Patient taking differently: Inject 20 Units into the skin See admin instructions. Inject 20 units  into the skin in the morning as needed for elevated BGL 10/11/21   Littie Deeds, MD  Insulin Pen Needle 32G X 4 MM MISC Use to inject insulin up to 4 times daily as directed. 08/09/21   Drema Dallas, MD  lidocaine (LIDODERM) 5 % Place 1 patch onto the skin daily. Remove & Discard patch within 12 hours or as directed by MD 09/26/22   Mardene Sayer, MD  losartan (COZAAR) 50 MG tablet Take 25 mg by mouth in the morning.    [provider]  Melatonin 10 MG TABS Take 10 mg by mouth at  bedtime.    [provider]  metFORMIN (GLUCOPHAGE) 500 MG tablet Take 500 mg by mouth daily with breakfast. 07/23/19   [provider]  metoprolol succinate (TOPROL-XL) 25 MG 24 hr tablet Take 1 tablet (25 mg total) by mouth daily. 10/12/21   Littie Deeds, MD  Multiple Vitamin (MULTIVITAMIN) tablet Take 1 tablet by mouth daily with breakfast.    [provider]  nystatin (MYCOSTATIN) 100000 UNIT/ML suspension Take 5 mLs (500,000 Units total) by mouth 4 (four) times daily. 09/26/22   Johney Maine, MD  omeprazole (PRILOSEC) 20 MG capsule Take 20 mg by mouth daily before breakfast. 11/28/20   [provider]  ondansetron (ZOFRAN-ODT) 4 MG disintegrating tablet Take 1 tablet (4 mg total) by mouth every 8 (eight) hours as needed for nausea or vomiting. 09/26/22   Mardene Sayer, MD  oxyCODONE (ROXICODONE) 5 MG immediate release tablet Take 1 tablet (5 mg total) by mouth every 6 (six) hours as needed for up to 10 doses for severe pain. 09/26/22   Mardene Sayer, MD  predniSONE (DELTASONE) 10 MG tablet Take 20 mg by mouth daily. 09/07/22   [provider]  rosuvastatin (CRESTOR) 10 MG tablet Take 10 mg by mouth daily.    [provider]  saccharomyces boulardii (FLORASTOR) 250 MG capsule Take 250 mg by mouth in the morning.    [provider]  TURMERIC PO Take 1 capsule by mouth at bedtime.    [provider]  venlafaxine XR (EFFEXOR-XR) 37.5 MG 24 hr capsule Take 37.5 mg by mouth daily with breakfast.    [provider]      Allergies    Latex, Codeine, Atenolol, Other, Hydrocodone, and Oxycodone    Review of Systems   Review of Systems  Musculoskeletal:  Positive for back pain.  All other systems reviewed and are negative.   Physical Exam Updated Vital Signs BP (!) 148/87   Pulse (!) 112   Temp 97.8 F (36.6 C) (Oral)   Resp 13   SpO2 94%  Physical Exam Vitals and nursing note reviewed.   Constitutional:      Appearance: She is well-developed.  HENT:     Head: Normocephalic and atraumatic.  Cardiovascular:     Rate and Rhythm: Regular rhythm. Tachycardia present.     Heart sounds: No murmur heard. Pulmonary:     Effort: Pulmonary effort is normal. No respiratory distress.     Breath sounds: Normal breath sounds.  Abdominal:     Palpations: Abdomen is soft.     Tenderness: There is no abdominal tenderness. There is no guarding or rebound.  Musculoskeletal:        General: No tenderness.     Comments: 2+ DP pulses bilaterally.  Pitting edema to bilateral lower extremities  Skin:    General: Skin is warm and dry.  Neurological:     Mental Status: She is alert and oriented to person, place, and time.     Comments: 5 out of 5 strength in all 4 extremities with sensation to light touch intact in all 4 extremities  Psychiatric:        Behavior: Behavior normal.     ED Results / Procedures / Treatments   Labs (all labs ordered are listed, but only abnormal results are displayed) Labs Reviewed  BASIC METABOLIC PANEL - Abnormal; Notable for the following components:      Result Value   Glucose, Bld 203 (*)    Creatinine, Ser 1.18 (*)    Calcium 8.8 (*)    GFR, Estimated 50 (*)    All other components within normal limits  CBC - Abnormal; Notable for the following components:   WBC 3.4 (*)    Hemoglobin 10.7 (*)    HCT 34.0 (*)    All other components within normal limits  URINALYSIS, ROUTINE W REFLEX MICROSCOPIC - Abnormal; Notable for the following components:   Glucose, UA 50 (*)    All other components within normal limits  HEPATIC FUNCTION PANEL - Abnormal; Notable for the following components:   Total Protein 5.3 (*)    Albumin 3.2 (*)    All other components within normal limits  BRAIN NATRIURETIC PEPTIDE  TROPONIN I (HIGH SENSITIVITY)  TROPONIN I (HIGH SENSITIVITY)    EKG EKG Interpretation  Date/Time:  Sunday October 02 2022 23:51:27  EDT Ventricular Rate:  110 PR Interval:  158 QRS Duration: 94 QT Interval:  360 QTC Calculation: 487 R Axis:   59 Text Interpretation: Sinus tachycardia Borderline prolonged QT interval Confirmed by Tilden Fossa [phone]) on 10/03/2022 2:43:55 AM  Radiology MR THORACIC SPINE WO CONTRAST  Result Date: 10/03/2022 CLINICAL DATA:  Initial evaluation for acute myelopathy. EXAM: MRI THORACIC SPINE WITHOUT CONTRAST TECHNIQUE: Multiplanar, multisequence MR imaging of the thoracic spine was performed. No intravenous contrast was administered. COMPARISON:  Prior study from 09/26/2022. FINDINGS: Alignment: Examination somewhat degraded by motion artifact. Sigmoid scoliosis, with dominant left convex component. Mild exaggeration of the normal thoracic kyphosis. Trace degenerative anterolisthesis of T2 on T3 and T3 on T4. Vertebrae: Compression deformity involving the T8 vertebral body with up to 60% height loss and trace 2 mm bony retropulsion, subacute in appearance with some persistent marrow edema. Additional subacute compression fracture seen at the adjacent inferior endplate of T7 without significant height loss or bony retropulsion. Additional T12 endplate fracture is largely chronic in appearance without significant residual marrow edema. Otherwise, vertebral body height maintained. Underlying bone marrow signal intensity within normal limits. No worrisome osseous lesions. Cord: Evaluation of the thoracic cord is limited by motion. On sagittal T2 and STIR sequences, there is questionable cord signal change at the level of T8, which could reflect mild edema and/or contusion related to the adjacent fracture (series 4, image 11). This is not well seen on corresponding axial sequences. Finding is not entirely certain given motion artifact on this exam. Otherwise normal signal and morphology. Paraspinal and other soft tissues: Paraspinous soft tissues demonstrate no acute finding. Fluid-filled patulous esophagus  noted. Disc levels: T1-2: Negative interspace. Left greater than right facet hypertrophy. No stenosis. T2-3: Mild disc bulge.  Left-sided facet hypertrophy.  No stenosis. T3-4: Right eccentric disc bulge with left greater than right facet hypertrophy. No spinal stenosis. Mild right foraminal narrowing. Left neural foramen remains patent. T4-5: Mild disc bulge with right greater left facet hypertrophy. No spinal  stenosis. Mild right foraminal narrowing. Left neural foramen remains patent. T5-6: Minimal disc bulge with bilateral facet hypertrophy. No spinal stenosis. Mild right foraminal narrowing. Left neural foramina remains patent. T6-7: Minimal disc bulge. Right-sided facet hypertrophy. No stenosis. T7-8: Negative interspace. Right greater left facet hypertrophy. No stenosis. T8-9: 2 mm bony retropulsion related to the T8 fracture. Underlying mild disc bulge with bilateral facet hypertrophy. Resultant mild spinal stenosis. Mild to moderate bilateral foraminal narrowing. T9-10: Negative interspace. Bilateral facet hypertrophy. No stenosis. T10-11: Minimal disc bulge. Bilateral facet hypertrophy. No significant stenosis. T11-12: Mild diffuse disc bulge. Bilateral facet hypertrophy. No significant stenosis. T12-L1: Diffuse disc bulge with bilateral facet hypertrophy. No significant stenosis. IMPRESSION: 1. Motion degraded exam. 2. Subacute compression fracture involving the T8 vertebral body with up to 60% height loss and trace 2 mm bony retropulsion. Resultant mild spinal stenosis with mild to moderate bilateral foraminal narrowing at this level. 3. Question subtle cord signal change at the level of T8, which could reflect mild edema and/or contusion related to the adjacent fracture. Finding is not entirely certain given motion degradation on this exam. Correlation with physical exam recommended. 4. Additional subacute compression fracture involving the inferior endplate of T7 without significant height loss or  retropulsion. 5. Chronic T12 fracture. 6. Underlying multilevel degenerative spondylosis and facet hypertrophy with resultant mild right foraminal narrowing at T3-4 through T5-6, with mild to moderate bilateral foraminal narrowing at T8-9. Electronically Signed   By: Rise Mu M.D.   On: 10/03/2022 02:28   MR Cervical Spine Wo Contrast  Result Date: 10/03/2022 CLINICAL DATA:  Initial evaluation for acute myelopathy. EXAM: MRI CERVICAL SPINE WITHOUT CONTRAST TECHNIQUE: Multiplanar, multisequence MR imaging of the cervical spine was performed. No intravenous contrast was administered. COMPARISON:  Prior study from 10/10/2011. FINDINGS: Alignment: Examination degraded by motion artifact. Straightening with slight reversal of the normal cervical lordosis. Trace degenerative anterolisthesis of C2 on C3, C4 on C5, C6 on C7, and C7 on T1. Vertebrae: Vertebral body height maintained without acute or chronic fracture. Bone marrow signal intensity within normal limits. No discrete or worrisome osseous lesions or abnormal marrow edema. Cord: Normal signal and morphology. Posterior Fossa, vertebral arteries, paraspinal tissues: Unremarkable. Disc levels: C2-C3: Trace anterolisthesis. No significant disc bulge. Moderate to advanced left worse than right facet arthrosis. No canal or foraminal stenosis. C3-C4: Minimal disc bulge with mild bilateral uncovertebral hypertrophy. Moderate left worse than right facet arthrosis. No spinal stenosis. Mild bilateral C4 foraminal stenosis. C4-C5: Trace anterolisthesis with diffuse disc osteophyte complex. Broad posterior component flattens the ventral thecal sac. Severe left with moderate right facet arthrosis. No spinal stenosis. Moderate to severe left with mild right C5 foraminal stenosis. C5-C6: Mild degenerative disc space narrowing with diffuse disc osteophyte complex. Moderate left with mild right facet arthrosis. No spinal stenosis. Severe left with moderate right C6  foraminal narrowing. C6-C7: Anterolisthesis with mild disc bulge. Moderate left with mild right facet hypertrophy. No spinal stenosis. Foramina remain patent. C7-T1: Anterolisthesis with mild disc bulge. Moderate left with mild right facet hypertrophy. No canal or foraminal stenosis. IMPRESSION: 1. Normal MRI appearance of the cervical spinal cord. No cord signal changes to suggest myelopathy. 2. Multilevel cervical spondylosis without significant spinal stenosis. Moderate to severe left C5 foraminal stenosis, with severe left and moderate right C6 foraminal narrowing. 3. Moderate to advanced multilevel facet hypertrophy as above, overall worse on the left. Findings could contribute to neck pain. Electronically Signed   By: Janell Quiet.D.  On: 10/03/2022 02:13   DG Chest Port 1 View  Result Date: 10/02/2022 CLINICAL DATA:  Chest pain and back pain. EXAM: PORTABLE CHEST 1 VIEW COMPARISON:  August 03, 2022 FINDINGS: The heart size and mediastinal contours are within normal limits. Mild, stable linear scarring and/or atelectasis is seen within the left lung base. There is no evidence of a pleural effusion or pneumothorax. Multilevel degenerative changes seen throughout the thoracic spine. IMPRESSION: Mild left basilar linear scarring and/or atelectasis. Electronically Signed   By: Aram Candela M.D.   On: 10/02/2022 23:46    Procedures Procedures    Medications Ordered in ED Medications  HYDROmorphone (DILAUDID) injection 1 mg (1 mg Intravenous Given 10/03/22 0009)  ondansetron (ZOFRAN) injection 4 mg (4 mg Intravenous Given 10/03/22 0008)  HYDROmorphone (DILAUDID) tablet 4 mg (4 mg Oral Given 10/03/22 0328)  furosemide (LASIX) tablet 20 mg (20 mg Oral Given 10/03/22 0347)    ED Course/ Medical Decision Making/ A&P                             Medical Decision Making Amount and/or Complexity of Data Reviewed Labs: ordered. Radiology: ordered.  Risk Prescription drug  management.   Patient with history of follicular lymphoma in remission, Crohn's, diabetes here for evaluation of back pain, lower extremity edema.  Her back pain started few weeks ago, lower extremity edema over the last 24 hours.  She is tachycardic on evaluation but in no acute distress.  On record review patient is frequently tachycardic on the bulk of her states.  She recently had a CTA dissection study performed for similar symptoms and had no evidence of dissection or PE.  Given her paresthesias, worsening pain and MRI cervical and thoracic spine were obtained, which do demonstrate her subacute compression fractures with some questionable cord edema.  Discussed with Megan with neurosurgery-recommends follow-up with Dr. Wynetta Emery.  Patient already has a TLSO available at home.  She states that her pain is reasonably well-controlled on hydromorphone at home.  She does have ongoing nausea-will prescribe ondansetron.  She does have lower extremity edema on examination, no evidence of acute CHF, cellulitis.  Current clinical picture is not consistent with DVT.  She was treated with one-time dose of furosemide.  Discussed with patient ongoing care for thoracic back pain in setting of compression fracture as well as lower extremity edema.  Feel she is stable for discharge home with neurosurgery, PCP follow-up and return precautions.        Final Clinical Impression(s) / ED Diagnoses Final diagnoses:  Closed wedge compression fracture of T8 vertebra with routine healing, subsequent encounter  Bilateral lower extremity edema    Rx / DC Orders ED Discharge Orders          Ordered    ondansetron (ZOFRAN) 4 MG tablet  Every 8 hours PRN        10/03/22 0323              Tilden Fossa, MD 10/03/22 [phone]

## 2022-10-02 NOTE — ED Triage Notes (Signed)
Patient coming to ED for evaluation of back pain x 2 weeks.  Reports dx with 2 "stress fractures in my back."  Was given oral pain medications in ED.  Had to have PCP prescribe new pain medication due to poor pain control.  Today noticed bilateral lower extremity swelling, numbness and tingling in R arm, and intermittent tingling to L arm.  States symptoms have worsened since pain started.

## 2022-10-03 ENCOUNTER — Emergency Department (HOSPITAL_COMMUNITY): Payer: Medicare Other

## 2022-10-03 ENCOUNTER — Encounter: Payer: Self-pay | Admitting: Hematology

## 2022-10-03 ENCOUNTER — Telehealth: Payer: Self-pay | Admitting: *Deleted

## 2022-10-03 LAB — HEPATIC FUNCTION PANEL
ALT: 32 U/L (ref 0–44)
AST: 28 U/L (ref 15–41)
Albumin: 3.2 g/dL — ABNORMAL LOW (ref 3.5–5.0)
Alkaline Phosphatase: 80 U/L (ref 38–126)
Bilirubin, Direct: 0.2 mg/dL (ref 0.0–0.2)
Indirect Bilirubin: 0.3 mg/dL (ref 0.3–0.9)
Total Bilirubin: 0.5 mg/dL (ref 0.3–1.2)
Total Protein: 5.3 g/dL — ABNORMAL LOW (ref 6.5–8.1)

## 2022-10-03 LAB — TROPONIN I (HIGH SENSITIVITY)
Troponin I (High Sensitivity): 16 ng/L (ref <18)
Troponin I (High Sensitivity): 17 ng/L (ref <18)

## 2022-10-03 LAB — BRAIN NATRIURETIC PEPTIDE: B Natriuretic Peptide: 31.5 pg/mL (ref 0.0–100.0)

## 2022-10-03 MED ORDER — FUROSEMIDE 20 MG PO TABS
20.0000 mg | ORAL_TABLET | Freq: Once | ORAL | Status: AC
  Administered 2022-10-03: 20 mg via ORAL
  Filled 2022-10-03: qty 1

## 2022-10-03 MED ORDER — HYDROMORPHONE HCL 2 MG PO TABS
4.0000 mg | ORAL_TABLET | Freq: Once | ORAL | Status: AC
  Administered 2022-10-03: 4 mg via ORAL
  Filled 2022-10-03: qty 2

## 2022-10-03 MED ORDER — ONDANSETRON HCL 4 MG PO TABS: 4.0000 mg | ORAL_TABLET | Freq: Three times a day (TID) | ORAL | 0 refills | Status: DC | PRN

## 2022-10-03 NOTE — ED Notes (Signed)
Transported to MRI

## 2022-10-03 NOTE — Telephone Encounter (Signed)
Pt called regarding which pharmacy additional Rx was e-scribed to.  RNCM reviewed chart to access After Visit Summary and found that Rx was only one Rx was written.  RNCM left message for prescribing EDP due to return tonight for assistance.  Advised pt that I will update her in the morning.

## 2022-10-03 NOTE — ED Notes (Signed)
Pt sating at 91% while sleeping, after receiving dilaudid. 2L 02 [place] applied, 02 back to 99%.

## 2022-10-04 ENCOUNTER — Encounter (HOSPITAL_COMMUNITY): Payer: Self-pay

## 2022-10-04 ENCOUNTER — Inpatient Hospital Stay (HOSPITAL_COMMUNITY)
Admission: EM | Admit: 2022-10-04 | Discharge: 2022-10-11 | DRG: 478 | Disposition: A | Discharge status: Home / Self Care | Payer: Medicare Other | Attending: Internal Medicine | Admitting: Internal Medicine

## 2022-10-04 ENCOUNTER — Emergency Department (HOSPITAL_COMMUNITY): Payer: Medicare Other

## 2022-10-04 ENCOUNTER — Telehealth: Payer: Self-pay | Admitting: *Deleted

## 2022-10-04 ENCOUNTER — Other Ambulatory Visit: Payer: Self-pay

## 2022-10-04 DIAGNOSIS — I10 Essential (primary) hypertension: Secondary | ICD-10-CM | POA: Diagnosis present

## 2022-10-04 DIAGNOSIS — Z801 Family history of malignant neoplasm of trachea, bronchus and lung: Secondary | ICD-10-CM

## 2022-10-04 DIAGNOSIS — J849 Interstitial pulmonary disease, unspecified: Secondary | ICD-10-CM | POA: Diagnosis present

## 2022-10-04 DIAGNOSIS — E8809 Other disorders of plasma-protein metabolism, not elsewhere classified: Secondary | ICD-10-CM | POA: Diagnosis present

## 2022-10-04 DIAGNOSIS — K219 Gastro-esophageal reflux disease without esophagitis: Secondary | ICD-10-CM | POA: Diagnosis present

## 2022-10-04 DIAGNOSIS — Y92017 Garden or yard in single-family (private) house as the place of occurrence of the external cause: Secondary | ICD-10-CM

## 2022-10-04 DIAGNOSIS — R112 Nausea with vomiting, unspecified: Secondary | ICD-10-CM | POA: Diagnosis present

## 2022-10-04 DIAGNOSIS — M4854XA Collapsed vertebra, not elsewhere classified, thoracic region, initial encounter for fracture: Principal | ICD-10-CM | POA: Diagnosis present

## 2022-10-04 DIAGNOSIS — K220 Achalasia of cardia: Secondary | ICD-10-CM | POA: Diagnosis present

## 2022-10-04 DIAGNOSIS — X58XXXA Exposure to other specified factors, initial encounter: Secondary | ICD-10-CM | POA: Diagnosis present

## 2022-10-04 DIAGNOSIS — Z7952 Long term (current) use of systemic steroids: Secondary | ICD-10-CM

## 2022-10-04 DIAGNOSIS — T402X5A Adverse effect of other opioids, initial encounter: Secondary | ICD-10-CM | POA: Diagnosis present

## 2022-10-04 DIAGNOSIS — K5090 Crohn's disease, unspecified, without complications: Secondary | ICD-10-CM | POA: Diagnosis present

## 2022-10-04 DIAGNOSIS — Z90710 Acquired absence of both cervix and uterus: Secondary | ICD-10-CM

## 2022-10-04 DIAGNOSIS — K5900 Constipation, unspecified: Secondary | ICD-10-CM | POA: Diagnosis present

## 2022-10-04 DIAGNOSIS — E876 Hypokalemia: Secondary | ICD-10-CM | POA: Diagnosis present

## 2022-10-04 DIAGNOSIS — S22060A Wedge compression fracture of T7-T8 vertebra, initial encounter for closed fracture: Secondary | ICD-10-CM

## 2022-10-04 DIAGNOSIS — C8218 Follicular lymphoma grade II, lymph nodes of multiple sites: Secondary | ICD-10-CM | POA: Diagnosis present

## 2022-10-04 DIAGNOSIS — R911 Solitary pulmonary nodule: Secondary | ICD-10-CM | POA: Diagnosis present

## 2022-10-04 DIAGNOSIS — Y93H2 Activity, gardening and landscaping: Secondary | ICD-10-CM

## 2022-10-04 DIAGNOSIS — Z79899 Other long term (current) drug therapy: Secondary | ICD-10-CM

## 2022-10-04 DIAGNOSIS — E669 Obesity, unspecified: Secondary | ICD-10-CM | POA: Diagnosis present

## 2022-10-04 DIAGNOSIS — Z888 Allergy status to other drugs, medicaments and biological substances status: Secondary | ICD-10-CM

## 2022-10-04 DIAGNOSIS — Z7984 Long term (current) use of oral hypoglycemic drugs: Secondary | ICD-10-CM

## 2022-10-04 DIAGNOSIS — M546 Pain in thoracic spine: Secondary | ICD-10-CM

## 2022-10-04 DIAGNOSIS — R600 Localized edema: Secondary | ICD-10-CM | POA: Diagnosis present

## 2022-10-04 DIAGNOSIS — L03115 Cellulitis of right lower limb: Secondary | ICD-10-CM | POA: Diagnosis present

## 2022-10-04 DIAGNOSIS — Z23 Encounter for immunization: Secondary | ICD-10-CM

## 2022-10-04 DIAGNOSIS — Z803 Family history of malignant neoplasm of breast: Secondary | ICD-10-CM

## 2022-10-04 DIAGNOSIS — I480 Paroxysmal atrial fibrillation: Secondary | ICD-10-CM | POA: Diagnosis present

## 2022-10-04 DIAGNOSIS — Z6832 Body mass index (BMI) 32.0-32.9, adult: Secondary | ICD-10-CM

## 2022-10-04 DIAGNOSIS — N179 Acute kidney failure, unspecified: Secondary | ICD-10-CM | POA: Diagnosis present

## 2022-10-04 DIAGNOSIS — Z885 Allergy status to narcotic agent status: Secondary | ICD-10-CM

## 2022-10-04 DIAGNOSIS — Z1152 Encounter for screening for COVID-19: Secondary | ICD-10-CM

## 2022-10-04 DIAGNOSIS — Z794 Long term (current) use of insulin: Secondary | ICD-10-CM

## 2022-10-04 DIAGNOSIS — E119 Type 2 diabetes mellitus without complications: Secondary | ICD-10-CM | POA: Diagnosis present

## 2022-10-04 LAB — CBC WITH DIFFERENTIAL/PLATELET
Abs Immature Granulocytes: 0.04 10*3/uL (ref 0.00–0.07)
Basophils Absolute: 0 10*3/uL (ref 0.0–0.1)
Basophils Relative: 0 %
Eosinophils Absolute: 0.1 10*3/uL (ref 0.0–0.5)
Eosinophils Relative: 1 %
HCT: 36.3 % (ref 36.0–46.0)
Hemoglobin: 11.3 g/dL — ABNORMAL LOW (ref 12.0–15.0)
Immature Granulocytes: 1 %
Lymphocytes Relative: 23 %
Lymphs Abs: 1.1 10*3/uL (ref 0.7–4.0)
MCH: 26.3 pg (ref 26.0–34.0)
MCHC: 31.1 g/dL (ref 30.0–36.0)
MCV: 84.6 fL (ref 80.0–100.0)
Monocytes Absolute: 0.9 10*3/uL (ref 0.1–1.0)
Monocytes Relative: 18 %
Neutro Abs: 2.8 10*3/uL (ref 1.7–7.7)
Neutrophils Relative %: 57 %
Platelets: 236 10*3/uL (ref 150–400)
RBC: 4.29 MIL/uL (ref 3.87–5.11)
RDW: 15.5 % (ref 11.5–15.5)
WBC: 4.8 10*3/uL (ref 4.0–10.5)
nRBC: 0 % (ref 0.0–0.2)

## 2022-10-04 LAB — COMPREHENSIVE METABOLIC PANEL
ALT: 31 U/L (ref 0–44)
AST: 28 U/L (ref 15–41)
Albumin: 3.4 g/dL — ABNORMAL LOW (ref 3.5–5.0)
Alkaline Phosphatase: 81 U/L (ref 38–126)
Anion gap: 16 — ABNORMAL HIGH (ref 5–15)
BUN: 8 mg/dL (ref 8–23)
CO2: 26 mmol/L (ref 22–32)
Calcium: 9.1 mg/dL (ref 8.9–10.3)
Chloride: 97 mmol/L — ABNORMAL LOW (ref 98–111)
Creatinine, Ser: 1.31 mg/dL — ABNORMAL HIGH (ref 0.44–1.00)
GFR, Estimated: 44 mL/min — ABNORMAL LOW (ref >60)
Glucose, Bld: 237 mg/dL — ABNORMAL HIGH (ref 70–99)
Potassium: 3.2 mmol/L — ABNORMAL LOW (ref 3.5–5.1)
Sodium: 139 mmol/L (ref 135–145)
Total Bilirubin: 0.8 mg/dL (ref 0.3–1.2)
Total Protein: 5.8 g/dL — ABNORMAL LOW (ref 6.5–8.1)

## 2022-10-04 LAB — BRAIN NATRIURETIC PEPTIDE: B Natriuretic Peptide: 72.9 pg/mL (ref 0.0–100.0)

## 2022-10-04 LAB — SARS CORONAVIRUS 2 BY RT PCR: SARS Coronavirus 2 by RT PCR: NEGATIVE

## 2022-10-04 LAB — MAGNESIUM: Magnesium: 1.3 mg/dL — ABNORMAL LOW (ref 1.7–2.4)

## 2022-10-04 LAB — TROPONIN I (HIGH SENSITIVITY): Troponin I (High Sensitivity): 11 ng/L (ref <18)

## 2022-10-04 MED ORDER — HYDROMORPHONE HCL 1 MG/ML IJ SOLN
1.0000 mg | Freq: Once | INTRAMUSCULAR | Status: AC
  Administered 2022-10-04: 1 mg via INTRAVENOUS
  Filled 2022-10-04: qty 1

## 2022-10-04 MED ORDER — SODIUM CHLORIDE 0.9 % IV BOLUS
500.0000 mL | Freq: Once | INTRAVENOUS | Status: AC
  Administered 2022-10-04: 500 mL via INTRAVENOUS

## 2022-10-04 MED ORDER — POTASSIUM CHLORIDE CRYS ER 20 MEQ PO TBCR
40.0000 meq | EXTENDED_RELEASE_TABLET | Freq: Once | ORAL | Status: AC
  Administered 2022-10-04: 40 meq via ORAL
  Filled 2022-10-04: qty 2

## 2022-10-04 MED ORDER — IOHEXOL 350 MG/ML SOLN
75.0000 mL | Freq: Once | INTRAVENOUS | Status: AC | PRN
  Administered 2022-10-04: 75 mL via INTRAVENOUS

## 2022-10-04 MED ORDER — ONDANSETRON 4 MG PO TBDP
4.0000 mg | ORAL_TABLET | Freq: Once | ORAL | Status: AC
  Administered 2022-10-04: 4 mg via ORAL
  Filled 2022-10-04: qty 1

## 2022-10-04 MED ORDER — ONDANSETRON HCL 4 MG/2ML IJ SOLN
4.0000 mg | Freq: Once | INTRAMUSCULAR | Status: AC
  Administered 2022-10-04: 4 mg via INTRAVENOUS
  Filled 2022-10-04: qty 2

## 2022-10-04 NOTE — ED Provider Notes (Signed)
[place] [facility] AT [facility] Provider Note   CSN: [id number] Arrival date & time: 10/04/22  1931     History  Chief Complaint  Patient presents with   Back Pain    Lower legs Swelling    Meghan Alexander is a 67 y.o. female.   Back Pain   67 year old female presents emergency department with complaints of back pain.  Patient states that she has been dealing symptoms for the past 3-3 and half weeks.  States that when symptoms began, was out in the garden and woke up the next day with throbbing back pain.  She states that since then, has noted intermittent tingling sensation/decreased sensation in hands bilaterally of which she is not currently experiencing.  She had CT imaging and then subsequently MR imaging which showed T8 and T7 subacute compression fractures with retropulsion at T8.  She states that she was on recurrent subsequently hydromorphone for pain control but states that she is felt continued nausea with episodes of emesis and intolerance of anything by mouth besides small amounts of liquids and today small sips of Ensure.  Patient also endorses shortness of breath of which she has noticed over the past 3 to 4 weeks but states it is getting worse.  Denies any fever, cough, congestion, abdominal pain, hematemesis, urinary symptoms, change in bowel habits.  Past medical history significant for follicular lymphoma with last steroid dose 3 to 4 weeks ago, hypertension, diabetes mellitus type 2, sinus tachycardia, hypertension, atrial fibrillation, interstitial lung disease, chronic diarrhea, Crohn's disease  Home Medications Prior to Admission medications   Medication Sig Start Date End Date Taking? Authorizing Provider  BENADRYL ALLERGY 25 MG tablet Take 25 mg by mouth at bedtime.   Yes [provider]  CYLTEZO-CD/UC/HS STARTER 40 MG/0.8ML AJKT Inject 160 mLs into the skin every 14 (fourteen) days. Started 09/10/2022. 09/09/22  Yes [provider]  HUMALOG KWIKPEN 100 UNIT/ML KwikPen Inject 5 Units into the skin with breakfast, with lunch, and with evening meal. Patient taking differently: Inject 15 Units into the skin See admin instructions. Inject 15 units into the skin before breakfast and an additional 15 units once a day as needed for elevated BGL 10/11/21  Yes Littie Deeds, MD  insulin detemir (LEVEMIR) 100 UNIT/ML FlexPen Inject 10 Units into the skin daily. Patient taking differently: Inject 20 Units into the skin See admin instructions. Inject 20 units into the skin in the morning as needed for elevated BGL 10/11/21  Yes Littie Deeds, MD  losartan (COZAAR) 50 MG tablet Take 25 mg by mouth in the morning.   Yes [provider]  Melatonin 10 MG TABS Take 10 mg by mouth at bedtime.   Yes [provider]  metFORMIN (GLUCOPHAGE) 500 MG tablet Take 500 mg by mouth daily with breakfast. 07/23/19  Yes [provider]  metoprolol succinate (TOPROL-XL) 25 MG 24 hr tablet Take 1 tablet (25 mg total) by mouth daily. 10/12/21  Yes Littie Deeds, MD  Multiple Vitamin (MULTIVITAMIN) tablet Take 1 tablet by mouth daily with breakfast.   Yes [provider]  omeprazole (PRILOSEC) 20 MG capsule Take 20 mg by mouth daily before breakfast. 11/28/20  Yes [provider]  ondansetron (ZOFRAN) 4 MG tablet Take 1 tablet (4 mg total) by mouth every 8 (eight) hours as needed for nausea or vomiting. 10/03/22  Yes Tilden Fossa, MD  oxyCODONE (ROXICODONE) 5 MG immediate release tablet Take 1 tablet (5 mg total) by mouth  every 6 (six) hours as needed for up to 10 doses for severe pain. 09/26/22  Yes Mardene Sayer, MD  rosuvastatin (CRESTOR) 10 MG tablet Take 10 mg by mouth daily.   Yes [provider]  saccharomyces boulardii (FLORASTOR) 250 MG capsule Take 250 mg by mouth in the morning.   Yes [provider]  TURMERIC PO Take 1 capsule by mouth at bedtime.   Yes [provider]   venlafaxine XR (EFFEXOR-XR) 37.5 MG 24 hr capsule Take 37.5 mg by mouth daily with breakfast.   Yes [provider]  Accu-Chek Softclix Lancets lancets Use to test blood sugars up to 4 times daily as directed. 08/09/21   Drema Dallas, MD  Blood Glucose Monitoring Suppl (BLOOD GLUCOSE MONITOR SYSTEM) w/Device KIT Use to test blood sugars up to 4 times daily. 08/09/21   Drema Dallas, MD  Continuous Blood Gluc Sensor (FREESTYLE LIBRE 2 SENSOR) MISC Inject 1 Device into the skin every 14 (fourteen) days.    [provider]  diltiazem (CARDIZEM CD) 240 MG 24 hr capsule Take 1 capsule (240 mg total) by mouth daily. Patient not taking: Reported on 10/05/2022 10/11/21   Littie Deeds, MD  glucose blood (ACCU-CHEK GUIDE) test strip Use to test blood sugars up to 4 times daily as directed. 08/09/21   Drema Dallas, MD  Insulin Pen Needle 32G X 4 MM MISC Use to inject insulin up to 4 times daily as directed. 08/09/21   Drema Dallas, MD  lidocaine (LIDODERM) 5 % Place 1 patch onto the skin daily. Remove & Discard patch within 12 hours or as directed by MD Patient not taking: Reported on 10/05/2022 09/26/22   Mardene Sayer, MD  predniSONE (DELTASONE) 10 MG tablet Take 20 mg by mouth daily. Patient not taking: Reported on 10/05/2022 09/07/22   [provider]      Allergies    Latex, Codeine, Atenolol, Other, Hydrocodone, and Oxycodone    Review of Systems   Review of Systems  Musculoskeletal:  Positive for back pain.  All other systems reviewed and are negative.   Physical Exam Updated Vital Signs BP (!) 159/93   Pulse (!) 116   Temp 97.7 F (36.5 C) (Oral)   Resp (!) 24   Ht 5\' 2"  (1.575 m)   Wt 80 kg   SpO2 95%   BMI 32.26 kg/m  Physical Exam Vitals and nursing note reviewed.  Constitutional:      General: She is not in acute distress.    Appearance: She is well-developed.  HENT:     Head: Normocephalic and atraumatic.  Eyes:     Conjunctiva/sclera:  Conjunctivae normal.  Cardiovascular:     Rate and Rhythm: Normal rate and regular rhythm.     Heart sounds: No murmur heard. Pulmonary:     Effort: Pulmonary effort is normal. No respiratory distress.     Breath sounds: Normal breath sounds.  Abdominal:     Palpations: Abdomen is soft.     Tenderness: There is no abdominal tenderness.  Musculoskeletal:        General: No swelling.     Cervical back: Neck supple.     Right lower leg: Edema present.     Left lower leg: Edema present.     Comments: 2-3+ bilateral lower extremity pitting edema. Midline tenderness of lower thoracic T7-T8. Patient with symmetric strength in grip, elbow flexion/extension bilaterally.  No sensory deficits along major nerve distributions of  upper extremities.  Radial pulses 2+ bilaterally.  Skin:    General: Skin is warm and dry.     Capillary Refill: Capillary refill takes less than 2 seconds.     Comments: Patient with erythematous appearing skin on anterior aspect of right lower leg with overlying warmth to touch compared to adjacent nonerythematous skin.  Area tender to palpation.  Neurological:     Mental Status: She is alert.  Psychiatric:        Mood and Affect: Mood normal.     ED Results / Procedures / Treatments   Labs (all labs ordered are listed, but only abnormal results are displayed) Labs Reviewed  CBC WITH DIFFERENTIAL/PLATELET - Abnormal; Notable for the following components:      Result Value   Hemoglobin 11.3 (*)    All other components within normal limits  COMPREHENSIVE METABOLIC PANEL - Abnormal; Notable for the following components:   Potassium 3.2 (*)    Chloride 97 (*)    Glucose, Bld 237 (*)    Creatinine, Ser 1.31 (*)    Total Protein 5.8 (*)    Albumin 3.4 (*)    GFR, Estimated 44 (*)    Anion gap 16 (*)    All other components within normal limits  MAGNESIUM - Abnormal; Notable for the following components:   Magnesium 1.3 (*)    All other components within normal  limits  I-STAT VENOUS BLOOD GAS, ED - Abnormal; Notable for the following components:   pO2, Ven 29 (*)    Bicarbonate 32.0 (*)    TCO2 34 (*)    Acid-Base Excess 6.0 (*)    Potassium 3.2 (*)    Calcium, Ion 1.14 (*)    HCT 28.0 (*)    Hemoglobin 9.5 (*)    All other components within normal limits  SARS CORONAVIRUS 2 BY RT PCR  BRAIN NATRIURETIC PEPTIDE  BETA-HYDROXYBUTYRIC ACID  URINALYSIS, ROUTINE W REFLEX MICROSCOPIC  TROPONIN I (HIGH SENSITIVITY)  TROPONIN I (HIGH SENSITIVITY)    EKG None  Radiology CT Angio Chest PE W/Cm &/Or Wo Cm  Result Date: 10/05/2022 CLINICAL DATA:  Pulmonary embolism (PE) suspected, high prob. Back pain. EXAM: CT ANGIOGRAPHY CHEST WITH CONTRAST TECHNIQUE: Multidetector CT imaging of the chest was performed using the standard protocol during bolus administration of intravenous contrast. Multiplanar CT image reconstructions and MIPs were obtained to evaluate the vascular anatomy. RADIATION DOSE REDUCTION: This exam was performed according to the departmental dose-optimization program which includes automated exposure control, adjustment of the mA and/or kV according to patient size and/or use of iterative reconstruction technique. CONTRAST:  75mL OMNIPAQUE IOHEXOL 350 MG/ML SOLN COMPARISON:  MRI 10/03/2022 FINDINGS: Cardiovascular: No filling defects in the pulmonary arteries to suggest pulmonary emboli. Heart is normal size. Aorta is normal caliber. Coronary artery and aortic calcifications. Mediastinum/Nodes: No mediastinal, hilar, or axillary adenopathy. Esophagus is patulous, dilated and fluid-filled. This is unchanged since prior study. Trachea and thyroid unremarkable. Lungs/Pleura: Medial left apical nodule again noted measuring approximately 9 mm compared to 10 mm previously. Linear densities in the lingula are stable, likely scarring. No acute confluent opacities or effusions. 7 mm inferior lingular nodule on image 80 is unchanged since prior study. Upper  Abdomen: Calcifications in the spleen and liver compatible with old granulomatous disease. No acute findings. Musculoskeletal: Chest wall soft tissues are unremarkable. Bilateral breast implants grossly unremarkable. Moderate to severe compression fracture noted at T8 as seen on prior MRI, unchanged. Stable mild compression fracture through the  superior endplate of T12. Review of the MIP images confirms the above findings. IMPRESSION: No evidence of pulmonary embolus. Stable left upper lobe nodules measuring up to 9 mm. Consider follow-up imaging in 6-12 months to assess stability. Patulous, fluid-filled esophagus is unchanged. Coronary artery disease. No acute cardiopulmonary disease. Aortic Atherosclerosis (ICD10-I70.0). Electronically Signed   By: Charlett Nose M.D.   On: 10/05/2022 00:09   DG Chest Port 1 View  Result Date: 10/04/2022 CLINICAL DATA:  Shortness of breath. EXAM: PORTABLE CHEST 1 VIEW COMPARISON:  10/02/2022, CT 09/26/2022 FINDINGS: Improved lung aeration. Stable heart size and mediastinal contours. Minor atelectasis at the left lung base, no confluent consolidation. No pleural fluid or pneumothorax. No pulmonary edema. Left apical nodule on CT not seen by radiograph IMPRESSION: Minor left basilar atelectasis. Electronically Signed   By: Narda Rutherford M.D.   On: 10/04/2022 22:50    Procedures Procedures    Medications Ordered in ED Medications  ondansetron (ZOFRAN-ODT) disintegrating tablet 4 mg (4 mg Oral Given 10/04/22 1953)  HYDROmorphone (DILAUDID) injection 1 mg (1 mg Intravenous Given 10/04/22 2254)  ondansetron (ZOFRAN) injection 4 mg (4 mg Intravenous Given 10/04/22 2252)  sodium chloride 0.9 % bolus 500 mL (0 mLs Intravenous Stopped 10/05/22 0038)  potassium chloride SA (KLOR-CON M) CR tablet 40 mEq (40 mEq Oral Given 10/04/22 2255)  iohexol (OMNIPAQUE) 350 MG/ML injection 75 mL (75 mLs Intravenous Contrast Given 10/04/22 2349)  magnesium oxide (MAG-OX) tablet 400 mg (400 mg  Oral Given 10/05/22 0037)  cefTRIAXone (ROCEPHIN) 1 g in sodium chloride 0.9 % 100 mL IVPB (0 g Intravenous Stopped 10/05/22 0225)    ED Course/ Medical Decision Making/ A&P Clinical Course as of 10/05/22 0322  Wed Oct 05, 2022  0025 Consulted Dr. Claudie Fisherman of hospital medicine who agreed with admission and assume further treatment/care. [CR]    Clinical Course User Index [CR] Peter Garter, PA                             Medical Decision Making Amount and/or Complexity of Data Reviewed Labs: ordered. Radiology: ordered.  Risk OTC drugs. Prescription drug management. Decision regarding hospitalization.   This patient presents to the ED for concern of back pain, shortness of breath, this involves an extensive number of treatment options, and is a complaint that carries with it a high risk of complications and morbidity.  The differential diagnosis includes fracture, dislocation, spinal cord injury, aortic dissection, Cardiac (AHF, pericardial effusion and tamponade, arrhythmias, ischemia, etc), Respiratory (COPD, asthma, pneumonia, pneumothorax, primary pulmonary hypertension, PE/VQ mismatch), Hematological (anemia), Neuromuscular (ALS, Guillain-Barr, etc)  Co morbidities that complicate the patient evaluation  See HPI   Additional history obtained:  Additional history obtained from EMR External records from outside source obtained and reviewed including hospital records   Lab Tests:  I Ordered, and personally interpreted labs.  The pertinent results include: No leukocytosis.  Evidence of anemia hemoglobin 11.3 which is near his baseline.  Patient with multiple electrolyte abnormalities including hypokalemia and hyperchloremia 3.297 respectively.  Patient with worsening renal function with creatinine 1.31, BUN of 8 and GFR 44 of which is elevated from patient's baseline prior to symptom onset with creatinine around 0.7 and GFR of greater than 60.  Patient with elevated anion gap  of 16.  BNP within normal range of 72.9.  Initial troponin of 11 with repeat 10.  COVID-negative.  Beta hydroxybutyric acid within normal limits.  UA pending  Imaging Studies ordered:  I ordered imaging studies including chest x-ray, CT angio PE I independently visualized and interpreted imaging which showed  Chest x-ray: No acute cardiopulmonary abnormalities.  Mild left atelectasis CT angio chest PE: No evidence of PE.  No evidence of acute cardiopulmonary abnormalities.  Stable pulmonary nodules. I agree with the radiologist interpretation   Cardiac Monitoring: / EKG:  The patient was maintained on a cardiac monitor.  I personally viewed and interpreted the cardiac monitored which showed an underlying rhythm of: Sinus tachycardia with QTc of 495   Consultations Obtained:  See ED course  Problem List / ED Course / Critical interventions / Medication management  AKI, back pain, cellulitis I ordered medication including Rocephin, 1 L normal saline, potassium chloride, hydromorphone, Zofran for  Reevaluation of the patient after these medicines showed that the patient improved I have reviewed the patients home medicines and have made adjustments as needed   Social Determinants of Health:  Denies tobacco, licit drug use   Test / Admission - Considered:  AKI, back pain, cellulitis Vitals signs significant for persistent tachycardia with heart rate greater than 100 throughout visit. Otherwise within normal range and stable throughout visit. Laboratory/imaging studies significant for: See above 67 year old female presents emergency department with complaints of continued thoracic back pain, nausea, vomiting, intolerance of p.o, shortness of breath.  Patient found with evidence of AKI with worsening creatinine of 1.31 with baseline around 0.7 prior to symptom onset of back pain and subsequent emesis from pain medications prescribed from first visit at the beginning of this month.   Patient without current any acute neurologic deficits from compression fractures with retropulsion of T8.  Regarding patient's AKI, most likely secondary to dehydration given intolerance of p.o. as well as symptoms of nausea and subsequent emesis.  Regarding shortness of breath, unsure of exact etiology.  Patient without evidence of worsening anemia from baseline.  Patient with lower extremity edema bilaterally but with normal BNP without pulmonary vascular congestion on chest x-ray/CT so low suspicion for CHF exacerbation.  Patient with delta negative troponin without acute ischemic changes on EKG so low suspicion for ACS.  Given patient's persistent tachycardia with continued shortness of breath over the past few weeks, CT PE study was performed which was negative for PE.  No evidence of pneumonia or other infectious process.  Patient without any clinical wheeze/rhonchi concerning for COPD/asthma exacerbation.  Regardless, given patient's AKI, continued thoracic back pain and tolerance of p.o., admission deemed most appropriate. Treatment plan were discussed at length with patient and they knowledge understanding was agreeable to said plan.  Appropriate consultations were made as described in the ED course.  Patient was stable upon admission to the hospital.         Final Clinical Impression(s) / ED Diagnoses Final diagnoses:  AKI (acute kidney injury) (HCC)  Midline thoracic back pain, unspecified chronicity  Cellulitis of right lower extremity    Rx / DC Orders ED Discharge Orders     None         Peter Garter, PA 10/05/22 1610    Glyn Ade, MD 10/05/22 1453

## 2022-10-04 NOTE — ED Triage Notes (Signed)
Patient reports persistent low back pain for several weeks , MRI result shows compression fractures prescribed with pain medications but cannot tolerate due to emesis /unable to eat , she adds swelling to lower legs for several days .

## 2022-10-04 NOTE — Telephone Encounter (Signed)
Pt spouse called regarding follow-up on Rx for lasix.  RNCM notified husband that I was able to reach EDP and the plan was to give a one-time dose in ER and to follow up with PCP for maintenance dose. Spouse verbalized understanding.

## 2022-10-04 NOTE — ED Provider Notes (Incomplete)
[place] EMERGENCY DEPARTMENT AT [facility] Provider Note   CSN: [id number] Arrival date & time: 10/04/22  1931     History {Add pertinent medical, surgical, social history, OB history to HPI:1} Chief Complaint  Patient presents with  . Back Pain    Lower legs Swelling    Meghan Alexander is a 67 y.o. female.   Back Pain   67 year old female presents emergency department with complaints of back pain.  Patient states that she has been dealing symptoms for the past 3-3 and half weeks.  States that when symptoms began, was out in the garden and woke up the next day with throbbing back pain.  She states that since then, has noted intermittent tingling sensation/decreased sensation in hands bilaterally of which she is not currently experiencing.  She had CT imaging and then subsequently MR imaging which showed T8 and T7 subacute compression fractures with retropulsion at T8.  She states that she was on recurrent subsequently hydromorphone for pain control but states that she is felt continued nausea with episodes of emesis and intolerance of anything by mouth besides small amounts of liquids and today small sips of Ensure.  Patient also endorses shortness of breath of which she has noticed over the past 3 to 4 weeks but states it is getting worse.  Denies any fever, cough, congestion, abdominal pain, hematemesis, urinary symptoms, change in bowel habits.  Past medical history significant for follicular lymphoma with last steroid dose 3 to 4 weeks ago, hypertension, diabetes mellitus type 2, sinus tachycardia, hypertension, atrial fibrillation, interstitial lung disease, chronic diarrhea, Crohn's disease  Home Medications Prior to Admission medications   Medication Sig Start Date End Date Taking? Authorizing Provider  Accu-Chek Softclix Lancets lancets Use to test blood sugars up to 4 times daily as directed. 08/09/21   Drema Dallas, MD  BENADRYL ALLERGY 25 MG tablet Take 25 mg by  mouth at bedtime.    [provider]  Blood Glucose Monitoring Suppl (BLOOD GLUCOSE MONITOR SYSTEM) w/Device KIT Use to test blood sugars up to 4 times daily. 08/09/21   Drema Dallas, MD  Continuous Blood Gluc Sensor (FREESTYLE LIBRE 2 SENSOR) MISC Inject 1 Device into the skin every 14 (fourteen) days.    [provider]  CYLTEZO-CD/UC/HS STARTER 40 MG/0.8ML AJKT Inject 160 mLs into the skin every 14 (fourteen) days. Started 09/10/2022. 09/09/22   [provider]  diltiazem (CARDIZEM CD) 240 MG 24 hr capsule Take 1 capsule (240 mg total) by mouth daily. 10/11/21   Littie Deeds, MD  fluticasone (FLONASE) 50 MCG/ACT nasal spray Place 2 sprays into both nostrils in the morning.    [provider]  glucose blood (ACCU-CHEK GUIDE) test strip Use to test blood sugars up to 4 times daily as directed. 08/09/21   Drema Dallas, MD  HUMALOG KWIKPEN 100 UNIT/ML KwikPen Inject 5 Units into the skin with breakfast, with lunch, and with evening meal. Patient taking differently: Inject 15 Units into the skin See admin instructions. Inject 15 units into the skin before breakfast and an additional 15 units once a day as needed for elevated BGL 10/11/21   Littie Deeds, MD  insulin detemir (LEVEMIR) 100 UNIT/ML FlexPen Inject 10 Units into the skin daily. Patient taking differently: Inject 20 Units into the skin See admin instructions. Inject 20 units into the skin in the morning as needed for elevated BGL 10/11/21   Littie Deeds, MD  Insulin Pen Needle 32G X 4 MM  MISC Use to inject insulin up to 4 times daily as directed. 08/09/21   Drema Dallas, MD  lidocaine (LIDODERM) 5 % Place 1 patch onto the skin daily. Remove & Discard patch within 12 hours or as directed by MD 09/26/22   Mardene Sayer, MD  losartan (COZAAR) 50 MG tablet Take 25 mg by mouth in the morning.    [provider]  Melatonin 10 MG TABS Take 10 mg by mouth at bedtime.    [provider]   metFORMIN (GLUCOPHAGE) 500 MG tablet Take 500 mg by mouth daily with breakfast. 07/23/19   [provider]  metoprolol succinate (TOPROL-XL) 25 MG 24 hr tablet Take 1 tablet (25 mg total) by mouth daily. 10/12/21   Littie Deeds, MD  Multiple Vitamin (MULTIVITAMIN) tablet Take 1 tablet by mouth daily with breakfast.    [provider]  nystatin (MYCOSTATIN) 100000 UNIT/ML suspension Take 5 mLs (500,000 Units total) by mouth 4 (four) times daily. 09/26/22   Johney Maine, MD  omeprazole (PRILOSEC) 20 MG capsule Take 20 mg by mouth daily before breakfast. 11/28/20   [provider]  ondansetron (ZOFRAN) 4 MG tablet Take 1 tablet (4 mg total) by mouth every 8 (eight) hours as needed for nausea or vomiting. 10/03/22   Tilden Fossa, MD  ondansetron (ZOFRAN-ODT) 4 MG disintegrating tablet Take 1 tablet (4 mg total) by mouth every 8 (eight) hours as needed for nausea or vomiting. 09/26/22   Mardene Sayer, MD  oxyCODONE (ROXICODONE) 5 MG immediate release tablet Take 1 tablet (5 mg total) by mouth every 6 (six) hours as needed for up to 10 doses for severe pain. 09/26/22   Mardene Sayer, MD  predniSONE (DELTASONE) 10 MG tablet Take 20 mg by mouth daily. 09/07/22   [provider]  rosuvastatin (CRESTOR) 10 MG tablet Take 10 mg by mouth daily.    [provider]  saccharomyces boulardii (FLORASTOR) 250 MG capsule Take 250 mg by mouth in the morning.    [provider]  TURMERIC PO Take 1 capsule by mouth at bedtime.    [provider]  venlafaxine XR (EFFEXOR-XR) 37.5 MG 24 hr capsule Take 37.5 mg by mouth daily with breakfast.    [provider]      Allergies    Latex, Codeine, Atenolol, Other, Hydrocodone, and Oxycodone    Review of Systems   Review of Systems  Musculoskeletal:  Positive for back pain.  All other systems reviewed and are negative.   Physical Exam Updated Vital Signs BP (!) 146/94 (BP Location:  Right Arm)   Temp 98.9 F (37.2 C) (Oral)   Resp 19   Ht 5\' 2"  (1.575 m)   Wt 80 kg   SpO2 97%   BMI 32.26 kg/m  Physical Exam Vitals and nursing note reviewed.  Constitutional:      General: She is not in acute distress.    Appearance: She is well-developed.  HENT:     Head: Normocephalic and atraumatic.  Eyes:     Conjunctiva/sclera: Conjunctivae normal.  Cardiovascular:     Rate and Rhythm: Normal rate and regular rhythm.     Heart sounds: No murmur heard. Pulmonary:     Effort: Pulmonary effort is normal. No respiratory distress.     Breath sounds: Normal breath sounds.  Abdominal:     Palpations: Abdomen is soft.     Tenderness: There is no abdominal tenderness.  Musculoskeletal:  General: No swelling.     Cervical back: Neck supple.     Right lower leg: Edema present.     Left lower leg: Edema present.     Comments: 2-3+ bilateral lower extremity pitting edema. Midline tenderness of lower thoracic T7-T8. Patient with symmetric strength in grip, elbow flexion/extension bilaterally.  No sensory deficits along major nerve distributions of upper extremities.  Radial pulses 2+ bilaterally.  Skin:    General: Skin is warm and dry.     Capillary Refill: Capillary refill takes less than 2 seconds.     Comments: Patient with erythematous appearing skin on anterior aspect of right lower leg with overlying warmth to touch compared to adjacent nonerythematous skin.  Area tender to palpation.  Neurological:     Mental Status: She is alert.  Psychiatric:        Mood and Affect: Mood normal.     ED Results / Procedures / Treatments   Labs (all labs ordered are listed, but only abnormal results are displayed) Labs Reviewed  CBC WITH DIFFERENTIAL/PLATELET - Abnormal; Notable for the following components:      Result Value   Hemoglobin 11.3 (*)    All other components within normal limits  COMPREHENSIVE METABOLIC PANEL - Abnormal; Notable for the following components:    Potassium 3.2 (*)    Chloride 97 (*)    Glucose, Bld 237 (*)    Creatinine, Ser 1.31 (*)    Total Protein 5.8 (*)    Albumin 3.4 (*)    GFR, Estimated 44 (*)    Anion gap 16 (*)    All other components within normal limits  SARS CORONAVIRUS 2 BY RT PCR  BRAIN NATRIURETIC PEPTIDE  MAGNESIUM  TROPONIN I (HIGH SENSITIVITY)    EKG None  Radiology MR THORACIC SPINE WO CONTRAST  Result Date: 10/03/2022 CLINICAL DATA:  Initial evaluation for acute myelopathy. EXAM: MRI THORACIC SPINE WITHOUT CONTRAST TECHNIQUE: Multiplanar, multisequence MR imaging of the thoracic spine was performed. No intravenous contrast was administered. COMPARISON:  Prior study from 09/26/2022. FINDINGS: Alignment: Examination somewhat degraded by motion artifact. Sigmoid scoliosis, with dominant left convex component. Mild exaggeration of the normal thoracic kyphosis. Trace degenerative anterolisthesis of T2 on T3 and T3 on T4. Vertebrae: Compression deformity involving the T8 vertebral body with up to 60% height loss and trace 2 mm bony retropulsion, subacute in appearance with some persistent marrow edema. Additional subacute compression fracture seen at the adjacent inferior endplate of T7 without significant height loss or bony retropulsion. Additional T12 endplate fracture is largely chronic in appearance without significant residual marrow edema. Otherwise, vertebral body height maintained. Underlying bone marrow signal intensity within normal limits. No worrisome osseous lesions. Cord: Evaluation of the thoracic cord is limited by motion. On sagittal T2 and STIR sequences, there is questionable cord signal change at the level of T8, which could reflect mild edema and/or contusion related to the adjacent fracture (series 4, image 11). This is not well seen on corresponding axial sequences. Finding is not entirely certain given motion artifact on this exam. Otherwise normal signal and morphology. Paraspinal and other  soft tissues: Paraspinous soft tissues demonstrate no acute finding. Fluid-filled patulous esophagus noted. Disc levels: T1-2: Negative interspace. Left greater than right facet hypertrophy. No stenosis. T2-3: Mild disc bulge.  Left-sided facet hypertrophy.  No stenosis. T3-4: Right eccentric disc bulge with left greater than right facet hypertrophy. No spinal stenosis. Mild right foraminal narrowing. Left neural foramen remains patent. T4-5: Mild disc  bulge with right greater left facet hypertrophy. No spinal stenosis. Mild right foraminal narrowing. Left neural foramen remains patent. T5-6: Minimal disc bulge with bilateral facet hypertrophy. No spinal stenosis. Mild right foraminal narrowing. Left neural foramina remains patent. T6-7: Minimal disc bulge. Right-sided facet hypertrophy. No stenosis. T7-8: Negative interspace. Right greater left facet hypertrophy. No stenosis. T8-9: 2 mm bony retropulsion related to the T8 fracture. Underlying mild disc bulge with bilateral facet hypertrophy. Resultant mild spinal stenosis. Mild to moderate bilateral foraminal narrowing. T9-10: Negative interspace. Bilateral facet hypertrophy. No stenosis. T10-11: Minimal disc bulge. Bilateral facet hypertrophy. No significant stenosis. T11-12: Mild diffuse disc bulge. Bilateral facet hypertrophy. No significant stenosis. T12-L1: Diffuse disc bulge with bilateral facet hypertrophy. No significant stenosis. IMPRESSION: 1. Motion degraded exam. 2. Subacute compression fracture involving the T8 vertebral body with up to 60% height loss and trace 2 mm bony retropulsion. Resultant mild spinal stenosis with mild to moderate bilateral foraminal narrowing at this level. 3. Question subtle cord signal change at the level of T8, which could reflect mild edema and/or contusion related to the adjacent fracture. Finding is not entirely certain given motion degradation on this exam. Correlation with physical exam recommended. 4. Additional  subacute compression fracture involving the inferior endplate of T7 without significant height loss or retropulsion. 5. Chronic T12 fracture. 6. Underlying multilevel degenerative spondylosis and facet hypertrophy with resultant mild right foraminal narrowing at T3-4 through T5-6, with mild to moderate bilateral foraminal narrowing at T8-9. Electronically Signed   By: Rise Mu M.D.   On: 10/03/2022 02:28   MR Cervical Spine Wo Contrast  Result Date: 10/03/2022 CLINICAL DATA:  Initial evaluation for acute myelopathy. EXAM: MRI CERVICAL SPINE WITHOUT CONTRAST TECHNIQUE: Multiplanar, multisequence MR imaging of the cervical spine was performed. No intravenous contrast was administered. COMPARISON:  Prior study from 10/10/2011. FINDINGS: Alignment: Examination degraded by motion artifact. Straightening with slight reversal of the normal cervical lordosis. Trace degenerative anterolisthesis of C2 on C3, C4 on C5, C6 on C7, and C7 on T1. Vertebrae: Vertebral body height maintained without acute or chronic fracture. Bone marrow signal intensity within normal limits. No discrete or worrisome osseous lesions or abnormal marrow edema. Cord: Normal signal and morphology. Posterior Fossa, vertebral arteries, paraspinal tissues: Unremarkable. Disc levels: C2-C3: Trace anterolisthesis. No significant disc bulge. Moderate to advanced left worse than right facet arthrosis. No canal or foraminal stenosis. C3-C4: Minimal disc bulge with mild bilateral uncovertebral hypertrophy. Moderate left worse than right facet arthrosis. No spinal stenosis. Mild bilateral C4 foraminal stenosis. C4-C5: Trace anterolisthesis with diffuse disc osteophyte complex. Broad posterior component flattens the ventral thecal sac. Severe left with moderate right facet arthrosis. No spinal stenosis. Moderate to severe left with mild right C5 foraminal stenosis. C5-C6: Mild degenerative disc space narrowing with diffuse disc osteophyte complex.  Moderate left with mild right facet arthrosis. No spinal stenosis. Severe left with moderate right C6 foraminal narrowing. C6-C7: Anterolisthesis with mild disc bulge. Moderate left with mild right facet hypertrophy. No spinal stenosis. Foramina remain patent. C7-T1: Anterolisthesis with mild disc bulge. Moderate left with mild right facet hypertrophy. No canal or foraminal stenosis. IMPRESSION: 1. Normal MRI appearance of the cervical spinal cord. No cord signal changes to suggest myelopathy. 2. Multilevel cervical spondylosis without significant spinal stenosis. Moderate to severe left C5 foraminal stenosis, with severe left and moderate right C6 foraminal narrowing. 3. Moderate to advanced multilevel facet hypertrophy as above, overall worse on the left. Findings could contribute to neck pain. Electronically Signed  By: Rise Mu M.D.   On: 10/03/2022 02:13   DG Chest Port 1 View  Result Date: 10/02/2022 CLINICAL DATA:  Chest pain and back pain. EXAM: PORTABLE CHEST 1 VIEW COMPARISON:  August 03, 2022 FINDINGS: The heart size and mediastinal contours are within normal limits. Mild, stable linear scarring and/or atelectasis is seen within the left lung base. There is no evidence of a pleural effusion or pneumothorax. Multilevel degenerative changes seen throughout the thoracic spine. IMPRESSION: Mild left basilar linear scarring and/or atelectasis. Electronically Signed   By: Aram Candela M.D.   On: 10/02/2022 23:46    Procedures Procedures  {Document cardiac monitor, telemetry assessment procedure when appropriate:1}  Medications Ordered in ED Medications  HYDROmorphone (DILAUDID) injection 1 mg (has no administration in time range)  ondansetron (ZOFRAN) injection 4 mg (has no administration in time range)  sodium chloride 0.9 % bolus 500 mL (has no administration in time range)  potassium chloride SA (KLOR-CON M) CR tablet 40 mEq (has no administration in time range)  ondansetron  (ZOFRAN-ODT) disintegrating tablet 4 mg (4 mg Oral Given 10/04/22 1953)    ED Course/ Medical Decision Making/ A&P   {   Click here for ABCD2, HEART and other calculatorsREFRESH Note before signing :1}                          Medical Decision Making Amount and/or Complexity of Data Reviewed Labs: ordered. Radiology: ordered.  Risk Prescription drug management.   This patient presents to the ED for concern of back pain, shortness of breath, this involves an extensive number of treatment options, and is a complaint that carries with it a high risk of complications and morbidity.  The differential diagnosis includes fracture, dislocation, spinal cord injury, aortic dissection, Cardiac (AHF, pericardial effusion and tamponade, arrhythmias, ischemia, etc), Respiratory (COPD, asthma, pneumonia, pneumothorax, primary pulmonary hypertension, PE/VQ mismatch), Hematological (anemia), Neuromuscular (ALS, Guillain-Barr, etc)  Co morbidities that complicate the patient evaluation  See HPI   Additional history obtained:  Additional history obtained from EMR External records from outside source obtained and reviewed including hospital records   Lab Tests:  I Ordered, and personally interpreted labs.  The pertinent results include: No leukocytosis.  Evidence of anemia hemoglobin 11.3 which is near his baseline.  Patient with multiple electrolyte abnormalities including hypokalemia and hyperchloremia 3.297 respectively.  Patient with worsening renal function with creatinine 1.31, BUN of 8 and GFR 44 of which is elevated from patient's baseline prior to symptom onset with creatinine around 0.7 and GFR of greater than 60.  Patient with elevated anion gap of 16.  BMP within normal range of 72.9. ***   Imaging Studies ordered:  I ordered imaging studies including ***  I independently visualized and interpreted imaging which showed *** I agree with the radiologist interpretation   Cardiac  Monitoring: / EKG:  The patient was maintained on a cardiac monitor.  I personally viewed and interpreted the cardiac monitored which showed an underlying rhythm of: ***   Consultations Obtained:  I requested consultation with the ***,  and discussed lab and imaging findings as well as pertinent plan - they recommend: ***   Problem List / ED Course / Critical interventions / Medication management  *** I ordered medication including ***  for ***  Reevaluation of the patient after these medicines showed that the patient {resolved/improved/worsened:23923::"improved"} I have reviewed the patients home medicines and have made adjustments as needed  Social Determinants of Health:  ***   Test / Admission - Considered:  Vitals signs significant for ***. Otherwise within normal range and stable throughout visit. Laboratory/imaging studies significant for: *** *** Treatment plan were discussed at length with patient and they knowledge understanding was agreeable to said plan.  Appropriate consultations were made as described in the ED course.  Patient was stable upon admission to the hospital.   {Document critical care time when appropriate:1} {Document review of labs and clinical decision tools ie heart score, Chads2Vasc2 etc:1}  {Document your independent review of radiology images, and any outside records:1} {Document your discussion with family members, caretakers, and with consultants:1} {Document social determinants of health affecting pt's care:1} {Document your decision making why or why not admission, treatments were needed:1} Final Clinical Impression(s) / ED Diagnoses Final diagnoses:  None    Rx / DC Orders ED Discharge Orders     None

## 2022-10-05 ENCOUNTER — Encounter (HOSPITAL_COMMUNITY): Payer: Medicare Other

## 2022-10-05 ENCOUNTER — Inpatient Hospital Stay (HOSPITAL_COMMUNITY): Payer: Medicare Other

## 2022-10-05 DIAGNOSIS — R112 Nausea with vomiting, unspecified: Secondary | ICD-10-CM | POA: Diagnosis present

## 2022-10-05 DIAGNOSIS — T402X5A Adverse effect of other opioids, initial encounter: Secondary | ICD-10-CM | POA: Diagnosis present

## 2022-10-05 DIAGNOSIS — Y92017 Garden or yard in single-family (private) house as the place of occurrence of the external cause: Secondary | ICD-10-CM | POA: Diagnosis not present

## 2022-10-05 DIAGNOSIS — Z1152 Encounter for screening for COVID-19: Secondary | ICD-10-CM | POA: Diagnosis not present

## 2022-10-05 DIAGNOSIS — N179 Acute kidney failure, unspecified: Secondary | ICD-10-CM | POA: Diagnosis present

## 2022-10-05 DIAGNOSIS — Z23 Encounter for immunization: Secondary | ICD-10-CM | POA: Diagnosis not present

## 2022-10-05 DIAGNOSIS — C8218 Follicular lymphoma grade II, lymph nodes of multiple sites: Secondary | ICD-10-CM | POA: Diagnosis present

## 2022-10-05 DIAGNOSIS — E8809 Other disorders of plasma-protein metabolism, not elsewhere classified: Secondary | ICD-10-CM | POA: Diagnosis present

## 2022-10-05 DIAGNOSIS — Z6832 Body mass index (BMI) 32.0-32.9, adult: Secondary | ICD-10-CM | POA: Diagnosis not present

## 2022-10-05 DIAGNOSIS — I10 Essential (primary) hypertension: Secondary | ICD-10-CM | POA: Diagnosis present

## 2022-10-05 DIAGNOSIS — K219 Gastro-esophageal reflux disease without esophagitis: Secondary | ICD-10-CM | POA: Diagnosis present

## 2022-10-05 DIAGNOSIS — J849 Interstitial pulmonary disease, unspecified: Secondary | ICD-10-CM | POA: Diagnosis present

## 2022-10-05 DIAGNOSIS — E669 Obesity, unspecified: Secondary | ICD-10-CM | POA: Diagnosis present

## 2022-10-05 DIAGNOSIS — R911 Solitary pulmonary nodule: Secondary | ICD-10-CM | POA: Diagnosis present

## 2022-10-05 DIAGNOSIS — K5900 Constipation, unspecified: Secondary | ICD-10-CM | POA: Diagnosis present

## 2022-10-05 DIAGNOSIS — K5090 Crohn's disease, unspecified, without complications: Secondary | ICD-10-CM | POA: Diagnosis present

## 2022-10-05 DIAGNOSIS — M7989 Other specified soft tissue disorders: Secondary | ICD-10-CM | POA: Diagnosis not present

## 2022-10-05 DIAGNOSIS — X58XXXA Exposure to other specified factors, initial encounter: Secondary | ICD-10-CM | POA: Diagnosis present

## 2022-10-05 DIAGNOSIS — M549 Dorsalgia, unspecified: Secondary | ICD-10-CM | POA: Diagnosis present

## 2022-10-05 DIAGNOSIS — M4854XA Collapsed vertebra, not elsewhere classified, thoracic region, initial encounter for fracture: Secondary | ICD-10-CM | POA: Diagnosis present

## 2022-10-05 DIAGNOSIS — E119 Type 2 diabetes mellitus without complications: Secondary | ICD-10-CM | POA: Diagnosis present

## 2022-10-05 DIAGNOSIS — E876 Hypokalemia: Secondary | ICD-10-CM | POA: Diagnosis present

## 2022-10-05 DIAGNOSIS — R600 Localized edema: Secondary | ICD-10-CM | POA: Diagnosis present

## 2022-10-05 DIAGNOSIS — Y93H2 Activity, gardening and landscaping: Secondary | ICD-10-CM | POA: Diagnosis not present

## 2022-10-05 DIAGNOSIS — K220 Achalasia of cardia: Secondary | ICD-10-CM | POA: Diagnosis present

## 2022-10-05 DIAGNOSIS — Z794 Long term (current) use of insulin: Secondary | ICD-10-CM | POA: Diagnosis not present

## 2022-10-05 DIAGNOSIS — I480 Paroxysmal atrial fibrillation: Secondary | ICD-10-CM | POA: Diagnosis present

## 2022-10-05 DIAGNOSIS — L03115 Cellulitis of right lower limb: Secondary | ICD-10-CM | POA: Diagnosis present

## 2022-10-05 HISTORY — PX: IR VERTEBROPLASTY CERV/THOR BX INC UNI/BIL INC/INJECT/IMAGING: IMG5515

## 2022-10-05 LAB — CBC
HCT: 35.3 % — ABNORMAL LOW (ref 36.0–46.0)
Hemoglobin: 11.1 g/dL — ABNORMAL LOW (ref 12.0–15.0)
MCH: 27.3 pg (ref 26.0–34.0)
MCHC: 31.4 g/dL (ref 30.0–36.0)
MCV: 86.7 fL (ref 80.0–100.0)
Platelets: 231 10*3/uL (ref 150–400)
RBC: 4.07 MIL/uL (ref 3.87–5.11)
RDW: 15.8 % — ABNORMAL HIGH (ref 11.5–15.5)
WBC: 4.2 10*3/uL (ref 4.0–10.5)
nRBC: 0 % (ref 0.0–0.2)

## 2022-10-05 LAB — URINALYSIS, ROUTINE W REFLEX MICROSCOPIC
Bilirubin Urine: NEGATIVE
Glucose, UA: NEGATIVE mg/dL
Hgb urine dipstick: NEGATIVE
Ketones, ur: NEGATIVE mg/dL
Nitrite: NEGATIVE
Protein, ur: NEGATIVE mg/dL
Specific Gravity, Urine: 1.014 (ref 1.005–1.030)
pH: 8 (ref 5.0–8.0)

## 2022-10-05 LAB — PHOSPHORUS
Phosphorus: 4.1 mg/dL (ref 2.5–4.6)
Phosphorus: 3.5 mg/dL (ref 2.5–4.6)

## 2022-10-05 LAB — BASIC METABOLIC PANEL
Anion gap: 16 — ABNORMAL HIGH (ref 5–15)
BUN: 6 mg/dL — ABNORMAL LOW (ref 8–23)
CO2: 27 mmol/L (ref 22–32)
Calcium: 9.2 mg/dL (ref 8.9–10.3)
Chloride: 100 mmol/L (ref 98–111)
Creatinine, Ser: 1.03 mg/dL — ABNORMAL HIGH (ref 0.44–1.00)
GFR, Estimated: 59 mL/min — ABNORMAL LOW (ref >60)
Glucose, Bld: 78 mg/dL (ref 70–99)
Potassium: 3.1 mmol/L — ABNORMAL LOW (ref 3.5–5.1)
Sodium: 143 mmol/L (ref 135–145)

## 2022-10-05 LAB — CK: Total CK: 83 U/L (ref 38–234)

## 2022-10-05 LAB — TROPONIN I (HIGH SENSITIVITY): Troponin I (High Sensitivity): 10 ng/L (ref <18)

## 2022-10-05 LAB — I-STAT VENOUS BLOOD GAS, ED
Acid-Base Excess: 6 mmol/L — ABNORMAL HIGH (ref 0.0–2.0)
Bicarbonate: 32 mmol/L — ABNORMAL HIGH (ref 20.0–28.0)
Calcium, Ion: 1.14 mmol/L — ABNORMAL LOW (ref 1.15–1.40)
HCT: 28 % — ABNORMAL LOW (ref 36.0–46.0)
Hemoglobin: 9.5 g/dL — ABNORMAL LOW (ref 12.0–15.0)
O2 Saturation: 53 %
Potassium: 3.2 mmol/L — ABNORMAL LOW (ref 3.5–5.1)
Sodium: 140 mmol/L (ref 135–145)
TCO2: 34 mmol/L — ABNORMAL HIGH (ref 22–32)
pCO2, Ven: 52.8 mmHg (ref 44–60)
pH, Ven: 7.39 (ref 7.25–7.43)
pO2, Ven: 29 mmHg — CL (ref 32–45)

## 2022-10-05 LAB — HIV ANTIBODY (ROUTINE TESTING W REFLEX): HIV Screen 4th Generation wRfx: NONREACTIVE

## 2022-10-05 LAB — BETA-HYDROXYBUTYRIC ACID: Beta-Hydroxybutyric Acid: 0.16 mmol/L (ref 0.05–0.27)

## 2022-10-05 LAB — CREATININE, SERUM
Creatinine, Ser: 1.06 mg/dL — ABNORMAL HIGH (ref 0.44–1.00)
GFR, Estimated: 57 mL/min — ABNORMAL LOW (ref >60)

## 2022-10-05 LAB — MAGNESIUM
Magnesium: 1.8 mg/dL (ref 1.7–2.4)
Magnesium: 1.8 mg/dL (ref 1.7–2.4)

## 2022-10-05 LAB — PROTIME-INR
INR: 1.1 (ref 0.8–1.2)
Prothrombin Time: 13.9 seconds (ref 11.4–15.2)

## 2022-10-05 LAB — GLUCOSE, CAPILLARY
Glucose-Capillary: 129 mg/dL — ABNORMAL HIGH (ref 70–99)
Glucose-Capillary: 74 mg/dL (ref 70–99)
Glucose-Capillary: 112 mg/dL — ABNORMAL HIGH (ref 70–99)
Glucose-Capillary: 147 mg/dL — ABNORMAL HIGH (ref 70–99)

## 2022-10-05 LAB — VITAMIN D 25 HYDROXY (VIT D DEFICIENCY, FRACTURES): Vit D, 25-Hydroxy: 37.87 ng/mL (ref 30–100)

## 2022-10-05 MED ORDER — FENTANYL CITRATE (PF) 250 MCG/5ML IJ SOLN
INTRAMUSCULAR | Status: AC
  Filled 2022-10-05: qty 5

## 2022-10-05 MED ORDER — ONDANSETRON HCL 4 MG PO TABS
4.0000 mg | ORAL_TABLET | Freq: Four times a day (QID) | ORAL | Status: DC | PRN
  Administered 2022-10-09: 4 mg via ORAL
  Filled 2022-10-05: qty 1

## 2022-10-05 MED ORDER — CEFAZOLIN SODIUM-DEXTROSE 2-4 GM/100ML-% IV SOLN
INTRAVENOUS | Status: AC | PRN
  Administered 2022-10-05: 2 g via INTRAVENOUS

## 2022-10-05 MED ORDER — KETOROLAC TROMETHAMINE 15 MG/ML IJ SOLN: 15.0000 mg | Freq: Once | INTRAMUSCULAR | Status: DC

## 2022-10-05 MED ORDER — ACETAMINOPHEN 325 MG PO TABS: 650.0000 mg | ORAL_TABLET | Freq: Four times a day (QID) | ORAL | Status: DC | PRN

## 2022-10-05 MED ORDER — SODIUM CHLORIDE 0.9 % IV SOLN: INTRAVENOUS | Status: AC

## 2022-10-05 MED ORDER — DEXTROSE-SODIUM CHLORIDE 5-0.45 % IV SOLN: INTRAVENOUS | Status: DC

## 2022-10-05 MED ORDER — PANTOPRAZOLE SODIUM 40 MG IV SOLR
40.0000 mg | Freq: Two times a day (BID) | INTRAVENOUS | Status: DC
  Administered 2022-10-05 – 2022-10-07 (×5): 40 mg via INTRAVENOUS
  Filled 2022-10-05 (×5): qty 10

## 2022-10-05 MED ORDER — IOHEXOL 300 MG/ML  SOLN
50.0000 mL | Freq: Once | INTRAMUSCULAR | Status: AC | PRN
  Administered 2022-10-05: 10 mL

## 2022-10-05 MED ORDER — BUPIVACAINE HCL (PF) 0.25 % IJ SOLN
INTRAMUSCULAR | Status: AC
  Filled 2022-10-05: qty 30

## 2022-10-05 MED ORDER — METHOCARBAMOL 1000 MG/10ML IJ SOLN
500.0000 mg | Freq: Four times a day (QID) | INTRAVENOUS | Status: DC | PRN
  Filled 2022-10-05: qty 5

## 2022-10-05 MED ORDER — INSULIN ASPART 100 UNIT/ML IJ SOLN
0.0000 [IU] | INTRAMUSCULAR | Status: DC
  Administered 2022-10-05 (×2): 1 [IU] via SUBCUTANEOUS
  Administered 2022-10-06: 2 [IU] via SUBCUTANEOUS
  Administered 2022-10-06 (×2): 1 [IU] via SUBCUTANEOUS
  Administered 2022-10-06: 2 [IU] via SUBCUTANEOUS
  Administered 2022-10-07 (×2): 1 [IU] via SUBCUTANEOUS
  Administered 2022-10-07: 3 [IU] via SUBCUTANEOUS

## 2022-10-05 MED ORDER — MAGNESIUM OXIDE -MG SUPPLEMENT 400 (240 MG) MG PO TABS
400.0000 mg | ORAL_TABLET | Freq: Once | ORAL | Status: AC
  Administered 2022-10-05: 400 mg via ORAL
  Filled 2022-10-05: qty 1

## 2022-10-05 MED ORDER — FENTANYL CITRATE (PF) 100 MCG/2ML IJ SOLN
INTRAMUSCULAR | Status: AC | PRN
  Administered 2022-10-05: 50 ug via INTRAVENOUS
  Administered 2022-10-05 (×2): 25 ug via INTRAVENOUS

## 2022-10-05 MED ORDER — MIDAZOLAM HCL 2 MG/2ML IJ SOLN
INTRAMUSCULAR | Status: AC
  Filled 2022-10-05: qty 2

## 2022-10-05 MED ORDER — LIDOCAINE 5 % EX PTCH
1.0000 | MEDICATED_PATCH | CUTANEOUS | Status: DC
  Administered 2022-10-05 – 2022-10-11 (×7): 1 via TRANSDERMAL
  Filled 2022-10-05 (×7): qty 1

## 2022-10-05 MED ORDER — SODIUM CHLORIDE 0.9 % IV SOLN
1.0000 g | Freq: Once | INTRAVENOUS | Status: AC
  Administered 2022-10-05: 1 g via INTRAVENOUS
  Filled 2022-10-05: qty 10

## 2022-10-05 MED ORDER — CEFAZOLIN SODIUM-DEXTROSE 2-4 GM/100ML-% IV SOLN
INTRAVENOUS | Status: AC
  Filled 2022-10-05: qty 100

## 2022-10-05 MED ORDER — HYDRALAZINE HCL 20 MG/ML IJ SOLN
INTRAMUSCULAR | Status: AC
  Filled 2022-10-05: qty 1

## 2022-10-05 MED ORDER — HYDRALAZINE HCL 20 MG/ML IJ SOLN
10.0000 mg | Freq: Four times a day (QID) | INTRAMUSCULAR | Status: DC | PRN
  Administered 2022-10-05: 10 mg via INTRAVENOUS

## 2022-10-05 MED ORDER — ONDANSETRON HCL 4 MG/2ML IJ SOLN
4.0000 mg | Freq: Four times a day (QID) | INTRAMUSCULAR | Status: DC | PRN
  Administered 2022-10-05 – 2022-10-10 (×6): 4 mg via INTRAVENOUS
  Filled 2022-10-05 (×6): qty 2

## 2022-10-05 MED ORDER — ACETAMINOPHEN 650 MG RE SUPP: 650.0000 mg | Freq: Four times a day (QID) | RECTAL | Status: DC | PRN

## 2022-10-05 MED ORDER — GELATIN ABSORBABLE 12-7 MM EX MISC
CUTANEOUS | Status: AC
  Filled 2022-10-05: qty 1

## 2022-10-05 MED ORDER — SODIUM CHLORIDE 0.9% FLUSH
3.0000 mL | Freq: Two times a day (BID) | INTRAVENOUS | Status: DC
  Administered 2022-10-05 – 2022-10-11 (×13): 3 mL via INTRAVENOUS

## 2022-10-05 MED ORDER — ENOXAPARIN SODIUM 40 MG/0.4ML IJ SOSY
40.0000 mg | PREFILLED_SYRINGE | INTRAMUSCULAR | Status: DC
  Administered 2022-10-06 – 2022-10-11 (×6): 40 mg via SUBCUTANEOUS
  Filled 2022-10-05 (×7): qty 0.4

## 2022-10-05 MED ORDER — SENNOSIDES-DOCUSATE SODIUM 8.6-50 MG PO TABS
1.0000 | ORAL_TABLET | Freq: Every evening | ORAL | Status: DC | PRN
  Administered 2022-10-07: 1 via ORAL
  Filled 2022-10-05: qty 1

## 2022-10-05 MED ORDER — MIDAZOLAM HCL 2 MG/2ML IJ SOLN
INTRAMUSCULAR | Status: AC | PRN
  Administered 2022-10-05 (×2): 1 mg via INTRAVENOUS

## 2022-10-05 MED ORDER — TRAZODONE HCL 50 MG PO TABS: 50.0000 mg | ORAL_TABLET | Freq: Every evening | ORAL | Status: DC | PRN

## 2022-10-05 MED ORDER — METOPROLOL TARTRATE 5 MG/5ML IV SOLN: 5.0000 mg | INTRAVENOUS | Status: DC | PRN

## 2022-10-05 MED ORDER — HYDROMORPHONE HCL 1 MG/ML IJ SOLN
0.5000 mg | INTRAMUSCULAR | Status: DC | PRN
  Administered 2022-10-05 – 2022-10-09 (×12): 0.5 mg via INTRAVENOUS
  Filled 2022-10-05 (×11): qty 0.5
  Filled 2022-10-05: qty 1
  Filled 2022-10-05: qty 0.5

## 2022-10-05 NOTE — H&P (Addendum)
History and Physical    Patient: Meghan Alexander NFA:[id number] DOB: Jan 01, 1956 DOA: 10/04/2022 DOS: the patient was seen and examined on 10/05/2022 PCP: Burton Apley, MD  Patient coming from: Home  Chief Complaint:  Chief Complaint  Patient presents with   Back Pain    Lower legs Swelling   HPI: Meghan Alexander is a 67 y.o. female with medical history significant of recently diagnosed Crohn's disease after nearly 1 year of daily watery diarrhea, history of follicular lymphoma, diabetes mellitus on insulin, remote PAF, interstitial lung disease, hypertension plasia.  Patient presented to the ER primarily due to painful bilateral lower extremity edema.  While talking with the triage providers and the ED providers she admitted that she had recently been working in her garden and developed sudden severe upper back pain.  She presented to the ED and an MRI on 6/10 did reveal subacute compression fracture involving the T8 vertebral body.  She was initially scribed with oxycodone with codeine preparation which she did not tolerate due to nausea and vomiting.  Dilaudid was ordered in addition to her meloxicam she typically takes for pain.  Unfortunately she developed intractable nausea and vomiting ongoing for at least 4 days.  She was unable to take her pain medications and actually stopped taking her insulin doses. She reports that her last steroids for her follicular lymphoma was about 1 month ago but she had been on and off steroid pulses for about 1 year.  On presentation to the ED she was afebrile, mildly hypertensive and tachycardic.  Room air sats were 97%.  On exam she had some redness in her lower extremities in the context of her bilateral lower extremity edema and given the amount of pain she was having there were concerns for cellulitis so she was given a dose of Rocephin.  Her white count was normal.  Her potassium was low at 3.2, her glucose was 237 and her creatinine was up at 1.31 with a  baseline creatinine of 1.03-1.04.  Anion gap abnormal at 16 and magnesium was low at 1.3. She was given 1 dose of oral magnesium with a sip of water.  Chest x-ray showed minor left basilar atelectasis.  CT angiography with contrast was negative for PE although there was a patulous fluid-filled esophagus.  Moderate to severe compression fracture on T8 unchanged from prior MRI.  There was also a stable mild compression fracture through the superior endplate of T12 show previous MRI was described as chronic.  Hospitalist service was asked to evaluate the patient for her intractable nausea and vomiting as well as her lower extremity edema.   Review of Systems: As mentioned in the history of present illness. All other systems reviewed and are negative.  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   GERD (gastroesophageal reflux disease)    hx of reflux-went away with elevating HOB    Hypertension    Pneumonia    hx of    PONV (postoperative nausea and vomiting)    Type 2 diabetes mellitus (HCC) 08/02/2021   Past Surgical History:  Procedure Laterality Date   ABDOMINAL HYSTERECTOMY     ADENOIDECTOMY     BIOPSY  03/10/2022   Procedure: BIOPSY;  Surgeon: Vida Rigger,  MD;  Location: [facility] ENDOSCOPY;  Service: Gastroenterology;;   BRONCHIAL BRUSHINGS  08/03/2021   Procedure: BRONCHIAL BRUSHINGS;  Surgeon: Leslye Peer, MD;  Location: [facility] ENDOSCOPY;  Service: Cardiopulmonary;;   BRONCHIAL WASHINGS  08/03/2021   Procedure: BRONCHIAL WASHINGS;  Surgeon: Leslye Peer, MD;  Location: [facility] ENDOSCOPY;  Service: Cardiopulmonary;;   cosmetic surgeries     ESOPHAGEAL MANOMETRY N/A 12/02/2015   Procedure: ESOPHAGEAL MANOMETRY (EM);  Surgeon: Willis Modena, MD;  Location: [facility] ENDOSCOPY;   Service: Endoscopy;  Laterality: N/A;   ESOPHAGOGASTRODUODENOSCOPY N/A 12/02/2015   Procedure: ESOPHAGOGASTRODUODENOSCOPY (EGD);  Surgeon: Willis Modena, MD;  Location: [facility] ENDOSCOPY;  Service: Endoscopy;  Laterality: N/A;   FLEXIBLE SIGMOIDOSCOPY N/A 03/10/2022   Procedure: FLEXIBLE SIGMOIDOSCOPY;  Surgeon: Vida Rigger, MD;  Location: [facility] ENDOSCOPY;  Service: Gastroenterology;  Laterality: N/A;   lymph node removal left leg     cat scratch surgery    ORIF ANKLE FRACTURE Right 09/03/2021   Procedure: OPEN REDUCTION INTERNAL FIXATION (ORIF) ANKLE FRACTURE;  Surgeon: Joen Laura, MD;  Location: [facility] OR;  Service: Orthopedics;  Laterality: Right;   THROAT SURGERY     sleep apnea surgery    TONSILLECTOMY     VIDEO BRONCHOSCOPY  08/03/2021   Procedure: VIDEO BRONCHOSCOPY WITH FLUORO;  Surgeon: Leslye Peer, MD;  Location: [facility] ENDOSCOPY;  Service: Cardiopulmonary;;   Social History:  reports that she has never smoked. She has never used smokeless tobacco. She reports that she does not drink alcohol and does not use drugs.  Allergies  Allergen Reactions   Latex Other (See Comments)    Blisters / skin peeling   Codeine Nausea And Vomiting and Other (See Comments)    Hallucinations also   Atenolol Cough   Other Itching, Swelling and Other (See Comments)    DermaPlast; Used after surgery - caused swelling, itching, and wound broke open   Hydrocodone Nausea And Vomiting   Oxycodone Nausea And Vomiting    Family History  Problem Relation Age of Onset   Lung cancer Father        1982 died from it   Hernia Mother    Breast cancer Maternal Aunt        multiple    Breast cancer Maternal Grandmother    Breast cancer Paternal Grandmother    Breast cancer Cousin        cousins multiple    Colon cancer Neg Hx    Colon polyps Neg Hx    Rectal cancer Neg Hx    Stomach cancer Neg Hx     Prior to Admission medications   Medication Sig Start Date End Date Taking? Authorizing Provider   BENADRYL ALLERGY 25 MG tablet Take 25 mg by mouth at bedtime.   Yes [provider]  CYLTEZO-CD/UC/HS STARTER 40 MG/0.8ML AJKT Inject 160 mLs into the skin every 14 (fourteen) days. Started 09/10/2022. 09/09/22  Yes [provider]  HUMALOG KWIKPEN 100 UNIT/ML KwikPen Inject 5 Units into the skin with breakfast, with lunch, and with evening meal. Patient taking differently: Inject 15 Units into the skin See admin instructions. Inject 15 units into the skin before breakfast and an additional 15 units once a day as needed for elevated BGL 10/11/21  Yes Littie Deeds, MD  insulin detemir (LEVEMIR) 100 UNIT/ML FlexPen Inject 10 Units into the skin daily. Patient taking differently: Inject 20 Units into the skin See admin instructions. Inject 20 units into the skin in the morning as needed for elevated BGL  10/11/21  Yes Littie Deeds, MD  losartan (COZAAR) 50 MG tablet Take 25 mg by mouth in the morning.   Yes [provider]  Melatonin 10 MG TABS Take 10 mg by mouth at bedtime.   Yes [provider]  metFORMIN (GLUCOPHAGE) 500 MG tablet Take 500 mg by mouth daily with breakfast. 07/23/19  Yes [provider]  metoprolol succinate (TOPROL-XL) 25 MG 24 hr tablet Take 1 tablet (25 mg total) by mouth daily. 10/12/21  Yes Littie Deeds, MD  Multiple Vitamin (MULTIVITAMIN) tablet Take 1 tablet by mouth daily with breakfast.   Yes [provider]  omeprazole (PRILOSEC) 20 MG capsule Take 20 mg by mouth daily before breakfast. 11/28/20  Yes [provider]  ondansetron (ZOFRAN) 4 MG tablet Take 1 tablet (4 mg total) by mouth every 8 (eight) hours as needed for nausea or vomiting. 10/03/22  Yes Tilden Fossa, MD  oxyCODONE (ROXICODONE) 5 MG immediate release tablet Take 1 tablet (5 mg total) by mouth every 6 (six) hours as needed for up to 10 doses for severe pain. 09/26/22  Yes Mardene Sayer, MD  rosuvastatin (CRESTOR) 10 MG tablet Take 10 mg by mouth  daily.   Yes [provider]  saccharomyces boulardii (FLORASTOR) 250 MG capsule Take 250 mg by mouth in the morning.   Yes [provider]  TURMERIC PO Take 1 capsule by mouth at bedtime.   Yes [provider]  venlafaxine XR (EFFEXOR-XR) 37.5 MG 24 hr capsule Take 37.5 mg by mouth daily with breakfast.   Yes [provider]  Accu-Chek Softclix Lancets lancets Use to test blood sugars up to 4 times daily as directed. 08/09/21   Drema Dallas, MD  Blood Glucose Monitoring Suppl (BLOOD GLUCOSE MONITOR SYSTEM) w/Device KIT Use to test blood sugars up to 4 times daily. 08/09/21   Drema Dallas, MD  Continuous Blood Gluc Sensor (FREESTYLE LIBRE 2 SENSOR) MISC Inject 1 Device into the skin every 14 (fourteen) days.    [provider]  diltiazem (CARDIZEM CD) 240 MG 24 hr capsule Take 1 capsule (240 mg total) by mouth daily. Patient not taking: Reported on 10/05/2022 10/11/21   Littie Deeds, MD  glucose blood (ACCU-CHEK GUIDE) test strip Use to test blood sugars up to 4 times daily as directed. 08/09/21   Drema Dallas, MD  Insulin Pen Needle 32G X 4 MM MISC Use to inject insulin up to 4 times daily as directed. 08/09/21   Drema Dallas, MD  lidocaine (LIDODERM) 5 % Place 1 patch onto the skin daily. Remove & Discard patch within 12 hours or as directed by MD Patient not taking: Reported on 10/05/2022 09/26/22   Mardene Sayer, MD  predniSONE (DELTASONE) 10 MG tablet Take 20 mg by mouth daily. Patient not taking: Reported on 10/05/2022 09/07/22   [provider]    Physical Exam: Vitals:   10/05/22 0400 10/05/22 0655 10/05/22 0715 10/05/22 0800  BP: (!) 161/100 (!) 157/90 (!) 156/93 (!) 148/89  Pulse: (!) 104 100 (!) 109   Resp: 20 20 16 14   Temp:   97.7 F (36.5 C)   TempSrc:   Oral   SpO2: 94% 95% 99%   Weight:      Height:       Constitutional: NAD, calm, comfortable after given IV and topical pain medications Respiratory: clear to  auscultation bilaterally, no wheezing, no crackles. Normal respiratory effort. No accessory muscle use.  Cardiovascular:  Regular rate and rhythm, no murmurs / rubs / gallops.  Marked bilateral lower extremity edema from the knees to the feet noting both extremities are exquisitely tender even to minimal palpation.  1-2+ pedal pulses.  Abdomen: no tenderness, no masses palpated. No hepatosplenomegaly. Bowel sounds positive.  Musculoskeletal: no clubbing / cyanosis. No joint deformity upper and lower extremities. Good ROM, no contractures. Normal muscle tone.  Skin: no rashes, lesions, ulcers. No induration-patient with faint redness to both bilateral lower extremities but no induration or true erythema. Neurologic: CN 2-12 grossly intact. Sensation intact, DTR normal. Strength 5/5 x all 4 extremities.  Psychiatric: Normal judgment and insight. Alert and oriented x 3. Normal mood.     Data Reviewed:  As per HPI  Repeat labs demonstrated persistent hypokalemia with potassium of 3.2, ionized calcium 1.14, phosphorus 4.1 and magnesium 1.8 improved from 1.3.  Albumin was slightly low at 3.4  Hemoglobin 11.1 with white count remains normal at 4.2, beta hydroxybutyrate acid 0.16  EKG revealed sinus tachycardia with borderline prolonged QTc of 495 ms, elevated J-point in context of tachycardic rate otherwise no acute ischemic or infarct type changes and ST segment or T waves.   Assessment and Plan: Intractable nausea and vomiting Appears to be mediated by uncontrolled pain as well as intolerance to oral Dilaudid N.p.o. for now and utilize IV and topical narcotic and nonnarcotic pain medications IV fluids to rehydrate Cautious use of antiemetics given borderline prolonged QTc Likely has severe gastritis as well as possible esophagitis from recurrent nausea and vomiting therefore we will add IV PPI every 12 hours  Acute kidney injury Likely from above-stated GI losses Baseline creatinine between  1.03 and 1.04 Presenting creatinine 1.31 Continue IV fluid hydration Avoid offending medications-gentle possibly was on an ARB prior to presentation as well as NSAIDs  Hypokalemia/hypomagnesemia Continue to replete potassium IV both and maintenance fluid and if needed as needed boluses Follow labs Magnesium improved after one-time oral dose Fortunately phosphate was normal  Known T8 compression fracture No focal neurological deficits Utilize lidocaine patch, scheduled IV Robaxin and IV Dilaudid initially for pain management PT OT evaluation Apply TLSO brace Check vitamin D level Consulted IR to determine if patient is appropriate candidate for kyphoplasty  Bilateral, painful lower extremity edema Patient states has been occurring for about 1 month and does have some shortness of breath Minimal hypoalbuminemia and this does not explain edema Check bilateral lower extremity duplex to rule out DVT Echocardiogram to rule out heart failure physiology-BNP was normal and TNI was also normal Cyltezo can cause elevated CK so will check  Diabetes mellitus 2 on insulin Normal beta hydroxybutyrate acid Will follow CBGs every 4 hours while n.p.o. and provide SSI coverage Once a baseline glucose trend can be determined can add back long-acting insulin but likely at a lower dose and can adjust once diet is advanced Hold metformin since unable to tolerate p.o.'s, mild metabolic acidosis secondary to AKI Given AKI will check urinalysis in regards to proteinuria  Hypertension Blood pressure elevated and modestly uncontrolled in context of recurrent nausea and vomiting and inability to take oral medications As needed IV hydralazine and monitor response  New diagnosis Crohn's disease Patient reports was started on Cyltezo-CD recently Diarrhea has improved and she has not had any blood in her stools    Advance Care Planning:   Code Status: Full Code   VTE prophylaxis: Lovenox  Consults:  Interventional radiology  Family Communication: Patient only  Severity of Illness: The  appropriate patient status for this patient is INPATIENT. Inpatient status is judged to be reasonable and necessary in order to provide the required intensity of service to ensure the patient's safety. The patient's presenting symptoms, physical exam findings, and initial radiographic and laboratory data in the context of their chronic comorbidities is felt to place them at high risk for further clinical deterioration. Furthermore, it is not anticipated that the patient will be medically stable for discharge from the hospital within 2 midnights of admission.   * I certify that at the point of admission it is my clinical judgment that the patient will require inpatient hospital care spanning beyond 2 midnights from the point of admission due to high intensity of service, high risk for further deterioration and high frequency of surveillance required.*  Author: Junious Silk, NP 10/05/2022 10:54 AM  For on call review [email or web address].

## 2022-10-05 NOTE — Progress Notes (Signed)
Echo and LE Venous attempted at 2:15, patient is going to IR. Will re-attempt as schedule permits.  [facility] Johnnette Laux RDCS

## 2022-10-05 NOTE — ED Notes (Signed)
Pt husband is bring her TLSO brace from home.

## 2022-10-05 NOTE — Consult Note (Signed)
Chief Complaint: Patient was seen in consultation today for T7/T8 compression fractures  Referring Physician(s): Junious Silk, NP  Supervising Physician: Julieanne Cotton  Patient Status: [facility] - In-pt  History of Present Illness: Meghan Alexander is a 67 y.o. female with PMH significant for Crohn's disease, follicular lymphoma, GERD, hypertension, PONV, and type II diabetes mellitus being seen today in relation to newly discovered T8 compression fracture. Patient presented to [facility] ED initially on 6/9 and again on 10/04/22 for back pain. She reports significant pain that is preventing her from completing her activities of daily living. Patient was found to have subacute compression fracture of T7 and T8 vertebrae. IR was consulted for possible T7-T8 kyphoplasty.  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   GERD (gastroesophageal reflux disease)    hx of reflux-went away with elevating HOB    Hypertension    Pneumonia    hx of    PONV (postoperative nausea and vomiting)    Type 2 diabetes mellitus (HCC) 08/02/2021    Past Surgical History:  Procedure Laterality Date   ABDOMINAL HYSTERECTOMY     ADENOIDECTOMY     BIOPSY  03/10/2022   Procedure: BIOPSY;  Surgeon: Vida Rigger, MD;  Location: [facility] ENDOSCOPY;  Service: Gastroenterology;;   BRONCHIAL BRUSHINGS  08/03/2021   Procedure: BRONCHIAL BRUSHINGS;  Surgeon: Leslye Peer, MD;  Location: [facility] ENDOSCOPY;  Service: Cardiopulmonary;;   BRONCHIAL WASHINGS  08/03/2021   Procedure: BRONCHIAL WASHINGS;  Surgeon: Leslye Peer, MD;  Location: [facility] ENDOSCOPY;  Service: Cardiopulmonary;;   cosmetic surgeries     ESOPHAGEAL MANOMETRY N/A 12/02/2015   Procedure: ESOPHAGEAL MANOMETRY (EM);  Surgeon: Willis Modena, MD;   Location: [facility] ENDOSCOPY;  Service: Endoscopy;  Laterality: N/A;   ESOPHAGOGASTRODUODENOSCOPY N/A 12/02/2015   Procedure: ESOPHAGOGASTRODUODENOSCOPY (EGD);  Surgeon: Willis Modena, MD;  Location: [facility] ENDOSCOPY;  Service: Endoscopy;  Laterality: N/A;   FLEXIBLE SIGMOIDOSCOPY N/A 03/10/2022   Procedure: FLEXIBLE SIGMOIDOSCOPY;  Surgeon: Vida Rigger, MD;  Location: [facility] ENDOSCOPY;  Service: Gastroenterology;  Laterality: N/A;   lymph node removal left leg     cat scratch surgery    ORIF ANKLE FRACTURE Right 09/03/2021   Procedure: OPEN REDUCTION INTERNAL FIXATION (ORIF) ANKLE FRACTURE;  Surgeon: Joen Laura, MD;  Location: [facility] OR;  Service: Orthopedics;  Laterality: Right;   THROAT SURGERY     sleep apnea surgery    TONSILLECTOMY     VIDEO BRONCHOSCOPY  08/03/2021   Procedure: VIDEO BRONCHOSCOPY WITH FLUORO;  Surgeon: Leslye Peer, MD;  Location: [facility] ENDOSCOPY;  Service: Cardiopulmonary;;    Allergies: Latex, Codeine, Atenolol, Other, Hydrocodone, and Oxycodone  Medications: Prior to Admission medications   Medication Sig Start Date End Date Taking? Authorizing Provider  BENADRYL ALLERGY 25 MG tablet Take 25 mg by mouth at bedtime.   Yes [provider]  CYLTEZO-CD/UC/HS STARTER 40 MG/0.8ML AJKT Inject 160 mLs into the skin every 14 (fourteen) days. Started 09/10/2022. 09/09/22  Yes [provider]  HUMALOG KWIKPEN 100 UNIT/ML KwikPen Inject 5 Units into the skin with breakfast, with lunch, and with evening meal. Patient taking differently: Inject 15 Units into the skin See admin instructions. Inject 15 units into the skin before breakfast and an additional 15  units once a day as needed for elevated BGL 10/11/21  Yes Littie Deeds, MD  insulin detemir (LEVEMIR) 100 UNIT/ML FlexPen Inject 10 Units into the skin daily. Patient taking differently: Inject 20 Units into the skin See admin instructions. Inject 20 units into the skin in the morning as needed for elevated BGL 10/11/21   Yes Littie Deeds, MD  losartan (COZAAR) 50 MG tablet Take 25 mg by mouth in the morning.   Yes [provider]  Melatonin 10 MG TABS Take 10 mg by mouth at bedtime.   Yes [provider]  metFORMIN (GLUCOPHAGE) 500 MG tablet Take 500 mg by mouth daily with breakfast. 07/23/19  Yes [provider]  metoprolol succinate (TOPROL-XL) 25 MG 24 hr tablet Take 1 tablet (25 mg total) by mouth daily. 10/12/21  Yes Littie Deeds, MD  Multiple Vitamin (MULTIVITAMIN) tablet Take 1 tablet by mouth daily with breakfast.   Yes [provider]  omeprazole (PRILOSEC) 20 MG capsule Take 20 mg by mouth daily before breakfast. 11/28/20  Yes [provider]  ondansetron (ZOFRAN) 4 MG tablet Take 1 tablet (4 mg total) by mouth every 8 (eight) hours as needed for nausea or vomiting. 10/03/22  Yes Tilden Fossa, MD  oxyCODONE (ROXICODONE) 5 MG immediate release tablet Take 1 tablet (5 mg total) by mouth every 6 (six) hours as needed for up to 10 doses for severe pain. 09/26/22  Yes Mardene Sayer, MD  rosuvastatin (CRESTOR) 10 MG tablet Take 10 mg by mouth daily.   Yes [provider]  saccharomyces boulardii (FLORASTOR) 250 MG capsule Take 250 mg by mouth in the morning.   Yes [provider]  TURMERIC PO Take 1 capsule by mouth at bedtime.   Yes [provider]  venlafaxine XR (EFFEXOR-XR) 37.5 MG 24 hr capsule Take 37.5 mg by mouth daily with breakfast.   Yes [provider]  Accu-Chek Softclix Lancets lancets Use to test blood sugars up to 4 times daily as directed. 08/09/21   Drema Dallas, MD  Blood Glucose Monitoring Suppl (BLOOD GLUCOSE MONITOR SYSTEM) w/Device KIT Use to test blood sugars up to 4 times daily. 08/09/21   Drema Dallas, MD  Continuous Blood Gluc Sensor (FREESTYLE LIBRE 2 SENSOR) MISC Inject 1 Device into the skin every 14 (fourteen) days.    [provider]  diltiazem (CARDIZEM CD) 240 MG 24 hr capsule Take 1  capsule (240 mg total) by mouth daily. Patient not taking: Reported on 10/05/2022 10/11/21   Littie Deeds, MD  glucose blood (ACCU-CHEK GUIDE) test strip Use to test blood sugars up to 4 times daily as directed. 08/09/21   Drema Dallas, MD  Insulin Pen Needle 32G X 4 MM MISC Use to inject insulin up to 4 times daily as directed. 08/09/21   Drema Dallas, MD  lidocaine (LIDODERM) 5 % Place 1 patch onto the skin daily. Remove & Discard patch within 12 hours or as directed by MD Patient not taking: Reported on 10/05/2022 09/26/22   Mardene Sayer, MD  predniSONE (DELTASONE) 10 MG tablet Take 20 mg by mouth daily. Patient not taking: Reported on 10/05/2022 09/07/22   [provider]     Family History  Problem Relation Age of Onset   Lung cancer Father        1982 died from it   Hernia Mother    Breast cancer Maternal Aunt        multiple  Breast cancer Maternal Grandmother    Breast cancer Paternal Grandmother    Breast cancer Cousin        cousins multiple    Colon cancer Neg Hx    Colon polyps Neg Hx    Rectal cancer Neg Hx    Stomach cancer Neg Hx     Social History   Socioeconomic History   Marital status: Married    Spouse name: Not on file   Number of children: Not on file   Years of education: Not on file   Highest education level: Not on file  Occupational History   Not on file  Tobacco Use   Smoking status: Never   Smokeless tobacco: Never  Vaping Use   Vaping Use: Never used  Substance and Sexual Activity   Alcohol use: No    Alcohol/week: 0.0 standard drinks of alcohol   Drug use: No   Sexual activity: Not on file  Other Topics Concern   Not on file  Social History Narrative   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, newspaper at [employer]   Never smoker never drinker   No drugs   Multiple allergies   Lives at home with her husband of 8 years since 2008   Social Determinants of Health   Financial Resource Strain: Not on file   Food Insecurity: No Food Insecurity (07/28/2022)   Hunger Vital Sign    Worried About Running Out of Food in the Last Year: Never true    Ran Out of Food in the Last Year: Never true  Transportation Needs: No Transportation Needs (07/28/2022)   PRAPARE - Administrator, Civil Service (Medical): No    Lack of Transportation (Non-Medical): No  Physical Activity: Not on file  Stress: Not on file  Social Connections: Not on file   Code Status: Full Code  Review of Systems: A 12 point ROS discussed and pertinent positives are indicated in the HPI above.  All other systems are negative.  Review of Systems  Constitutional:  Negative for chills and fever.  Respiratory:  Negative for chest tightness and shortness of breath.   Cardiovascular:  Positive for leg swelling. Negative for chest pain.  Gastrointestinal:  Positive for diarrhea and nausea. Negative for abdominal pain and vomiting.  Musculoskeletal:  Positive for back pain.  Neurological:  Positive for headaches. Negative for dizziness.  Psychiatric/Behavioral:  Negative for confusion.     Vital Signs: BP (!) 148/89   Pulse (!) 109   Temp 97.7 F (36.5 C) (Oral)   Resp 14   Ht 5\' 2"  (1.575 m)   Wt 176 lb 5.9 oz (80 kg)   SpO2 99%   BMI 32.26 kg/m   Advance Care Plan: The advanced care plan/surrogate decision maker was discussed at the time of visit and documented in the medical record.  Orvilla Cornwall is spouse/surrogate.  Physical Exam Vitals reviewed.  Constitutional:      General: She is not in acute distress.    Appearance: She is not ill-appearing.  HENT:     Mouth/Throat:     Mouth: Mucous membranes are moist.  Cardiovascular:     Rate and Rhythm: Regular rhythm. Tachycardia present.     Pulses: Normal pulses.     Heart sounds: Normal heart sounds.  Pulmonary:     Effort: Pulmonary effort is normal.     Breath sounds: Normal breath sounds.  Abdominal:     Palpations: Abdomen is soft.  Tenderness: There is no abdominal tenderness.  Musculoskeletal:     Right lower leg: Edema present.     Left lower leg: Edema present.  Skin:    General: Skin is warm and dry.  Neurological:     Mental Status: She is alert and oriented to person, place, and time.  Psychiatric:        Mood and Affect: Mood normal.        Behavior: Behavior normal.        Thought Content: Thought content normal.        Judgment: Judgment normal.     Imaging: CT Angio Chest PE W/Cm &/Or Wo Cm  Result Date: 10/05/2022 CLINICAL DATA:  Pulmonary embolism (PE) suspected, high prob. Back pain. EXAM: CT ANGIOGRAPHY CHEST WITH CONTRAST TECHNIQUE: Multidetector CT imaging of the chest was performed using the standard protocol during bolus administration of intravenous contrast. Multiplanar CT image reconstructions and MIPs were obtained to evaluate the vascular anatomy. RADIATION DOSE REDUCTION: This exam was performed according to the departmental dose-optimization program which includes automated exposure control, adjustment of the mA and/or kV according to patient size and/or use of iterative reconstruction technique. CONTRAST:  75mL OMNIPAQUE IOHEXOL 350 MG/ML SOLN COMPARISON:  MRI 10/03/2022 FINDINGS: Cardiovascular: No filling defects in the pulmonary arteries to suggest pulmonary emboli. Heart is normal size. Aorta is normal caliber. Coronary artery and aortic calcifications. Mediastinum/Nodes: No mediastinal, hilar, or axillary adenopathy. Esophagus is patulous, dilated and fluid-filled. This is unchanged since prior study. Trachea and thyroid unremarkable. Lungs/Pleura: Medial left apical nodule again noted measuring approximately 9 mm compared to 10 mm previously. Linear densities in the lingula are stable, likely scarring. No acute confluent opacities or effusions. 7 mm inferior lingular nodule on image 80 is unchanged since prior study. Upper Abdomen: Calcifications in the spleen and liver compatible with old  granulomatous disease. No acute findings. Musculoskeletal: Chest wall soft tissues are unremarkable. Bilateral breast implants grossly unremarkable. Moderate to severe compression fracture noted at T8 as seen on prior MRI, unchanged. Stable mild compression fracture through the superior endplate of T12. Review of the MIP images confirms the above findings. IMPRESSION: No evidence of pulmonary embolus. Stable left upper lobe nodules measuring up to 9 mm. Consider follow-up imaging in 6-12 months to assess stability. Patulous, fluid-filled esophagus is unchanged. Coronary artery disease. No acute cardiopulmonary disease. Aortic Atherosclerosis (ICD10-I70.0). Electronically Signed   By: Charlett Nose M.D.   On: 10/05/2022 00:09   DG Chest Port 1 View  Result Date: 10/04/2022 CLINICAL DATA:  Shortness of breath. EXAM: PORTABLE CHEST 1 VIEW COMPARISON:  10/02/2022, CT 09/26/2022 FINDINGS: Improved lung aeration. Stable heart size and mediastinal contours. Minor atelectasis at the left lung base, no confluent consolidation. No pleural fluid or pneumothorax. No pulmonary edema. Left apical nodule on CT not seen by radiograph IMPRESSION: Minor left basilar atelectasis. Electronically Signed   By: Narda Rutherford M.D.   On: 10/04/2022 22:50   MR THORACIC SPINE WO CONTRAST  Result Date: 10/03/2022 CLINICAL DATA:  Initial evaluation for acute myelopathy. EXAM: MRI THORACIC SPINE WITHOUT CONTRAST TECHNIQUE: Multiplanar, multisequence MR imaging of the thoracic spine was performed. No intravenous contrast was administered. COMPARISON:  Prior study from 09/26/2022. FINDINGS: Alignment: Examination somewhat degraded by motion artifact. Sigmoid scoliosis, with dominant left convex component. Mild exaggeration of the normal thoracic kyphosis. Trace degenerative anterolisthesis of T2 on T3 and T3 on T4. Vertebrae: Compression deformity involving the T8 vertebral body with up to 60% height loss and  trace 2 mm bony  retropulsion, subacute in appearance with some persistent marrow edema. Additional subacute compression fracture seen at the adjacent inferior endplate of T7 without significant height loss or bony retropulsion. Additional T12 endplate fracture is largely chronic in appearance without significant residual marrow edema. Otherwise, vertebral body height maintained. Underlying bone marrow signal intensity within normal limits. No worrisome osseous lesions. Cord: Evaluation of the thoracic cord is limited by motion. On sagittal T2 and STIR sequences, there is questionable cord signal change at the level of T8, which could reflect mild edema and/or contusion related to the adjacent fracture (series 4, image 11). This is not well seen on corresponding axial sequences. Finding is not entirely certain given motion artifact on this exam. Otherwise normal signal and morphology. Paraspinal and other soft tissues: Paraspinous soft tissues demonstrate no acute finding. Fluid-filled patulous esophagus noted. Disc levels: T1-2: Negative interspace. Left greater than right facet hypertrophy. No stenosis. T2-3: Mild disc bulge.  Left-sided facet hypertrophy.  No stenosis. T3-4: Right eccentric disc bulge with left greater than right facet hypertrophy. No spinal stenosis. Mild right foraminal narrowing. Left neural foramen remains patent. T4-5: Mild disc bulge with right greater left facet hypertrophy. No spinal stenosis. Mild right foraminal narrowing. Left neural foramen remains patent. T5-6: Minimal disc bulge with bilateral facet hypertrophy. No spinal stenosis. Mild right foraminal narrowing. Left neural foramina remains patent. T6-7: Minimal disc bulge. Right-sided facet hypertrophy. No stenosis. T7-8: Negative interspace. Right greater left facet hypertrophy. No stenosis. T8-9: 2 mm bony retropulsion related to the T8 fracture. Underlying mild disc bulge with bilateral facet hypertrophy. Resultant mild spinal stenosis. Mild to  moderate bilateral foraminal narrowing. T9-10: Negative interspace. Bilateral facet hypertrophy. No stenosis. T10-11: Minimal disc bulge. Bilateral facet hypertrophy. No significant stenosis. T11-12: Mild diffuse disc bulge. Bilateral facet hypertrophy. No significant stenosis. T12-L1: Diffuse disc bulge with bilateral facet hypertrophy. No significant stenosis. IMPRESSION: 1. Motion degraded exam. 2. Subacute compression fracture involving the T8 vertebral body with up to 60% height loss and trace 2 mm bony retropulsion. Resultant mild spinal stenosis with mild to moderate bilateral foraminal narrowing at this level. 3. Question subtle cord signal change at the level of T8, which could reflect mild edema and/or contusion related to the adjacent fracture. Finding is not entirely certain given motion degradation on this exam. Correlation with physical exam recommended. 4. Additional subacute compression fracture involving the inferior endplate of T7 without significant height loss or retropulsion. 5. Chronic T12 fracture. 6. Underlying multilevel degenerative spondylosis and facet hypertrophy with resultant mild right foraminal narrowing at T3-4 through T5-6, with mild to moderate bilateral foraminal narrowing at T8-9. Electronically Signed   By: Rise Mu M.D.   On: 10/03/2022 02:28   MR Cervical Spine Wo Contrast  Result Date: 10/03/2022 CLINICAL DATA:  Initial evaluation for acute myelopathy. EXAM: MRI CERVICAL SPINE WITHOUT CONTRAST TECHNIQUE: Multiplanar, multisequence MR imaging of the cervical spine was performed. No intravenous contrast was administered. COMPARISON:  Prior study from 10/10/2011. FINDINGS: Alignment: Examination degraded by motion artifact. Straightening with slight reversal of the normal cervical lordosis. Trace degenerative anterolisthesis of C2 on C3, C4 on C5, C6 on C7, and C7 on T1. Vertebrae: Vertebral body height maintained without acute or chronic fracture. Bone marrow  signal intensity within normal limits. No discrete or worrisome osseous lesions or abnormal marrow edema. Cord: Normal signal and morphology. Posterior Fossa, vertebral arteries, paraspinal tissues: Unremarkable. Disc levels: C2-C3: Trace anterolisthesis. No significant disc bulge. Moderate to advanced left  worse than right facet arthrosis. No canal or foraminal stenosis. C3-C4: Minimal disc bulge with mild bilateral uncovertebral hypertrophy. Moderate left worse than right facet arthrosis. No spinal stenosis. Mild bilateral C4 foraminal stenosis. C4-C5: Trace anterolisthesis with diffuse disc osteophyte complex. Broad posterior component flattens the ventral thecal sac. Severe left with moderate right facet arthrosis. No spinal stenosis. Moderate to severe left with mild right C5 foraminal stenosis. C5-C6: Mild degenerative disc space narrowing with diffuse disc osteophyte complex. Moderate left with mild right facet arthrosis. No spinal stenosis. Severe left with moderate right C6 foraminal narrowing. C6-C7: Anterolisthesis with mild disc bulge. Moderate left with mild right facet hypertrophy. No spinal stenosis. Foramina remain patent. C7-T1: Anterolisthesis with mild disc bulge. Moderate left with mild right facet hypertrophy. No canal or foraminal stenosis. IMPRESSION: 1. Normal MRI appearance of the cervical spinal cord. No cord signal changes to suggest myelopathy. 2. Multilevel cervical spondylosis without significant spinal stenosis. Moderate to severe left C5 foraminal stenosis, with severe left and moderate right C6 foraminal narrowing. 3. Moderate to advanced multilevel facet hypertrophy as above, overall worse on the left. Findings could contribute to neck pain. Electronically Signed   By: Rise Mu M.D.   On: 10/03/2022 02:13   DG Chest Port 1 View  Result Date: 10/02/2022 CLINICAL DATA:  Chest pain and back pain. EXAM: PORTABLE CHEST 1 VIEW COMPARISON:  August 03, 2022 FINDINGS: The heart  size and mediastinal contours are within normal limits. Mild, stable linear scarring and/or atelectasis is seen within the left lung base. There is no evidence of a pleural effusion or pneumothorax. Multilevel degenerative changes seen throughout the thoracic spine. IMPRESSION: Mild left basilar linear scarring and/or atelectasis. Electronically Signed   By: Aram Candela M.D.   On: 10/02/2022 23:46   CT T-SPINE NO CHARGE  Result Date: 09/26/2022 CLINICAL DATA:  Back pain EXAM: CT THORACIC SPINE WITHOUT CONTRAST TECHNIQUE: Multidetector CT images of the thoracic were obtained using the standard protocol without intravenous contrast. RADIATION DOSE REDUCTION: This exam was performed according to the departmental dose-optimization program which includes automated exposure control, adjustment of the mA and/or kV according to patient size and/or use of iterative reconstruction technique. COMPARISON:  Chest radiograph done on 08/03/2022 FINDINGS: Alignment: Alignment of posterior margins of vertebral bodies is within normal limits. There is S shaped scoliosis in thoracic spine. Vertebrae: There is 50-60% decrease in height of body of T8 vertebra which was not evident in the chest radiograph done on 08/03/2022 suggesting recent compression. There is 30-40% decrease in height of body of T12 vertebra, more so on the left side with possible interval worsening in comparison with the previous chest radiographs. Paraspinal and other soft tissues: Thoracic esophagus is distended with fluid in the lumen. This may suggest achalasia are chronic reflux or stricture at the gastroesophageal junction. There is inhomogeneous attenuation in thyroid with scattered nodules measuring up to 17 mm in diameter. Disc levels: There is no central spinal stenosis. There is no significant narrowing of neural foramina. IMPRESSION: There is 50-60% decrease in height of body of T8 vertebra suggesting recent compression fracture. There is 30-40%  narrowing of upper endplate of body of T12 vertebra with possible interval worsening suggesting acute on chronic compression. Follow-up MRI as clinically warranted should be considered. There is no central spinal stenosis or significant narrowing of neural foramina in the thoracic spine. There is distention of the thoracic esophagus with air-fluid level in the lumen. This may suggest achalasia or stricture in the  distal common bile duct or marked chronic gastroesophageal reflux. There are multiple nodules in thyroid measuring up to 17 mm in diameter. Recommend non-emergent thyroid ultrasound.Reference: J Am Coll Radiol. 2015 Feb;12(2): 143-50 Electronically Signed   By: Ernie Avena M.D.   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 Multidetector CT imaging through the chest, abdomen and pelvis was performed using the standard protocol during bolus administration of intravenous contrast. Multiplanar reconstructed images and MIPs were obtained and reviewed to evaluate the vascular anatomy. RADIATION DOSE REDUCTION: This exam was performed according to the departmental dose-optimization program which includes automated exposure control, adjustment of the mA and/or kV according to patient size and/or use of iterative reconstruction technique. CONTRAST:  OMNIPAQUE IOHEXOL 350 MG/ML SOLN COMPARISON:  CT abdomen and pelvis dated 03/08/2022, CT chest dated 01/26/2019 CTA chest dated 10/08/2021 FINDINGS: CTA CHEST FINDINGS Cardiovascular: Preferential opacification of the thoracic aorta. No evidence of thoracic aortic aneurysm or dissection. Normal heart size. No pericardial effusion. Anatomic variant common origin of the brachiocephalic and common  carotid arteries. No pulmonary emboli. Mediastinum/Nodes: Imaged thyroid gland without nodules meeting criteria for imaging follow-up by size. Patulous, fluid-filled esophagus is again seen. No pathologically enlarged axillary, supraclavicular, mediastinal, or hilar lymph nodes. Lungs/Pleura: The central airways are patent. New medial left apical irregular nodule measuring 1.0 x 0.4 cm (6:29). Linear atelectasis/scarring along the anteromedial left upper lobe. Bilateral lower lobe subpleural band-like ground-glass opacities, likely atelectasis. Unchanged 8 x 6 mm lingular nodule (6:82). No specific follow-up imaging recommended. No pneumothorax. No pleural effusion. Musculoskeletal: No acute or abnormal lytic or blastic osseous lesions. T8 and T12 compression deformities are better evaluated on same day CT thoracic spine. Review of the MIP images confirms the above findings. CTA ABDOMEN AND PELVIS FINDINGS VASCULAR Aorta: Aortic atherosclerosis. Normal caliber aorta without aneurysm, dissection, vasculitis or significant stenosis. Celiac: Patent without evidence of aneurysm, dissection, vasculitis or significant stenosis. SMA: Patent without evidence of aneurysm, dissection, vasculitis or significant stenosis. Renals: Both renal arteries are patent without evidence of aneurysm, dissection, vasculitis, fibromuscular dysplasia or significant stenosis. IMA: Patent without evidence of aneurysm, dissection, vasculitis or significant stenosis. Inflow: Patent without evidence of aneurysm, dissection, vasculitis or significant stenosis. Proximal Outflow: Bilateral common femoral and visualized portions of the superficial and profunda femoral arteries are patent without evidence of aneurysm, dissection, vasculitis or significant stenosis. Veins: No obvious venous abnormality within the limitations of this arterial phase study. Review of the MIP images confirms the above findings. NON-VASCULAR Hepatobiliary: Unchanged segment  6 cyst. No intra or extrahepatic biliary ductal dilation. Normal gallbladder. Pancreas: No focal lesions or main ductal dilation. Spleen: Normal in size without focal abnormality. Scattered calcified granulomas. Adrenals/Urinary Tract: No adrenal nodules. No suspicious renal mass, calculi or hydronephrosis. No focal bladder wall thickening. Stomach/Bowel: Normal appearance of the stomach. No evidence of bowel wall thickening, distention, or inflammatory changes. Normal appendix. Lymphatic: No enlarged abdominal or pelvic lymph nodes. Reproductive: No adnexal masses. Other: No free fluid, fluid collection, or free air. Musculoskeletal: No acute or abnormal lytic or blastic osseous lesions. Small fat-containing paraumbilical hernia. Review of the MIP images confirms the above findings. IMPRESSION: 1. No evidence of aortic aneurysm or dissection. T8 and T12 compression deformities are better evaluated on same day  CT thoracic spine. 2. No pulmonary emboli. 3. New medial left apical irregular nodule measuring 1.0 x 0.4 cm, likely infectious/inflammatory. Consider non-contrast chest CT in 6-12 months to ensure resolution. 4. Persistent patulous, fluid-filled esophagus. 5.  Aortic Atherosclerosis (ICD10-I70.0). Electronically Signed   By: Agustin Cree M.D.   On: 09/26/2022 14:41   CT L-SPINE NO CHARGE  Result Date: 09/26/2022 CLINICAL DATA:  Back pain EXAM: CT LUMBAR SPINE WITHOUT CONTRAST TECHNIQUE: Multidetector CT imaging of the lumbar spine was performed without intravenous contrast administration. Multiplanar CT image reconstructions were also generated. RADIATION DOSE REDUCTION: This exam was performed according to the departmental dose-optimization program which includes automated exposure control, adjustment of the mA and/or kV according to patient size and/or use of iterative reconstruction technique. COMPARISON:  CT abdomen and pelvis done on 03/08/2022 FINDINGS: Segmentation: There are 5 non-rib-bearing  vertebrae and lumbar spine Alignment: There is minimal 1-2 mm anterolisthesis at L4-L5 level. Vertebrae: No recent fracture is seen in the lumbar spine. There is mild to moderate decrease in height of upper endplate of body of T12 vertebra which was not evident in the previous CT abdomen done on 03/08/2022. Possible hemangiomas are noted in the bodies of L3 and L4 vertebrae with coarse trabeculae. Paraspinal and other soft tissues: Paraspinal soft tissues are unremarkable. There are scattered calcifications and atherosclerotic plaques in aorta. Disc levels: There is mild encroachment of neural foramina by bulging of the annulus at L3-L4 and L4-L5 levels. There is no central spinal stenosis. IMPRESSION: No recent fracture is seen in lumbar spine. There is 30-40% recent compression in the body of T12 vertebra. There is no central spinal stenosis. There is mild encroachment of neural foramina by bulging of the annulus and facet hypertrophy at L3-L4 and L4-L5 levels. There is minimal anterolisthesis at the L4-L5 level. Coarse trabeculae in the bodies of L3 and L4 vertebrae suggest possible hemangiomas. Electronically Signed   By: Ernie Avena M.D.   On: 09/26/2022 14:35    Labs:  CBC: Recent Labs    09/26/22 1115 10/02/22 2152 10/04/22 1948 10/05/22 0003 10/05/22 0934  WBC 5.6 3.4* 4.8  --  4.2  HGB 12.6 10.7* 11.3* 9.5* 11.1*  HCT 41.0 34.0* 36.3 28.0* 35.3*  PLT 231 202 236  --  231    COAGS: No results for input(s): "INR", "APTT" in the last 8760 hours.  BMP: Recent Labs    09/26/22 0859 09/26/22 1115 10/02/22 2152 10/04/22 1948 10/05/22 0003 10/05/22 0934  NA 140 140 141 139 140  --   K 4.6 4.4 3.6 3.2* 3.2*  --   CL 102 102 100 97*  --   --   CO2 30 26 27 26   --   --   GLUCOSE 159* 150* 203* 237*  --   --   BUN 23 24* 12 8  --   --   CALCIUM 9.6 9.1 8.8* 9.1  --   --   CREATININE 1.04* 1.03* 1.18* 1.31*  --  1.06*  GFRNONAA 58* 59* 50* 44*  --  57*    LIVER FUNCTION  TESTS: Recent Labs    07/31/22 0529 09/26/22 0859 10/03/22 0019 10/04/22 1948  BILITOT 0.4 0.4 0.5 0.8  AST 40 37 28 28  ALT 56* 60* 32 31  ALKPHOS 78 119 80 81  PROT 5.5* 6.5 5.3* 5.8*  ALBUMIN 3.4* 4.3 3.2* 3.4*    TUMOR MARKERS: No results for input(s): "AFPTM", "CEA", "CA199", "CHROMGRNA" in the last 8760 hours.  Assessment and Plan:  T7 and T8 compression fractures -Patient with 7/10 back pain that she reports interferes with her daily activities and she describes pain as "debilitating" -Case reviewed and approved by Dr Corliss Skains for kyphoplasty of T7 and T8 vertebrae with biopsy.  -Insurance authorization for procedure obtained, patient is covered under inpatient stay -Patient is NPO, reports not having any oral intake today -PT/INR ordered, pre-procedural ancef ordered  Risks and benefits of T7 and T8 kyphoplasty with biopsy were discussed with the patient including, but not limited to education regarding the natural healing process of compression fractures without intervention, bleeding, infection, cement migration which may cause spinal cord damage, paralysis, pulmonary embolism or even death.  This interventional procedure involves the use of X-rays and because of the nature of the planned procedure, it is possible that we will have prolonged use of X-ray fluoroscopy.  Potential radiation risks to you include (but are not limited to) the following: - A slightly elevated risk for cancer  several years later in life. This risk is typically less than 0.5% percent. This risk is low in comparison to the normal incidence of human cancer, which is 33% for women and 50% for men according to the [employer]. - Radiation induced injury can include skin redness, resembling a rash, tissue breakdown / ulcers and hair loss (which can be temporary or permanent).   The likelihood of either of these occurring depends on the difficulty of the procedure and whether you are  sensitive to radiation due to previous procedures, disease, or genetic conditions.   IF your procedure requires a prolonged use of radiation, you will be notified and given written instructions for further action.  It is your responsibility to monitor the irradiated area for the 2 weeks following the procedure and to notify your physician if you are concerned that you have suffered a radiation induced injury.    All of the patient's questions were answered, patient is agreeable to proceed.  Consent signed and in IR suite.   Thank you for this interesting consult.  I greatly enjoyed meeting Meghan Alexander and look forward to participating in their care.  A copy of this report was sent to the requesting provider on this date.  Electronically Signed: Kennieth Francois, PA-C 10/05/2022, 11:53 AM   I spent a total of 40 Minutes  in face to face in clinical consultation, greater than 50% of which was counseling/coordinating care for T7 and T8 compression fractures

## 2022-10-05 NOTE — Procedures (Signed)
INR.  Status post  T 8 vertebroplasty with fluoroscopic guidance via  left transpedicular approach.  Core biopsies obtained and sent for pathologic analysis as per request of referring MD.  No acute complications.  Patient tolerated the procedure well.  Fatima Sanger MD

## 2022-10-05 NOTE — ED Notes (Signed)
ED TO INPATIENT HANDOFF REPORT  ED Nurse Name and Phone #: Dot Lanes, paramedic [phone]  S Name/Age/Gender Meghan Alexander 67 y.o. female Room/Bed: 003C/003C  Code Status   Code Status: Full Code  Home/SNF/Other Home Patient oriented to: self, place, time, and situation Is this baseline? Yes   Triage Complete: Triage complete  Chief Complaint Intractable nausea and vomiting [R11.2]  Triage Note Patient reports persistent low back pain for several weeks , MRI result shows compression fractures prescribed with pain medications but cannot tolerate due to emesis /unable to eat , she adds swelling to lower legs for several days .    Allergies Allergies  Allergen Reactions   Latex Other (See Comments)    Blisters / skin peeling   Codeine Nausea And Vomiting and Other (See Comments)    Hallucinations also   Atenolol Cough   Other Itching, Swelling and Other (See Comments)    DermaPlast; Used after surgery - caused swelling, itching, and wound broke open   Hydrocodone Nausea And Vomiting   Oxycodone Nausea And Vomiting    Level of Care/Admitting Diagnosis ED Disposition     ED Disposition  Admit   Condition  --   Comment  Hospital Area: [facility] [[id number]]  Level of Care: Med-Surg [16]  May admit patient to [facility] or [facility] if equivalent level of care is available:: Yes  Covid Evaluation: Confirmed COVID Negative  Diagnosis: Intractable nausea and vomiting [720114]  Admitting Physician: Stephania Fragmin [facility] [[id number]]  Attending Physician: Dimple Nanas [[id number]]  Bed request comments: amin  Certification:: I certify this patient will need inpatient services for at least 2 midnights  Estimated Length of Stay: 4          B Medical/Surgery History Past Medical History:  Diagnosis Date   Allergy    year around allergies   Arthritis    fingers, knees, neck, back-osteoarthristis   Bronchitis    hx of   Complication of anesthesia     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 ENDOSCOPY;  Service: Gastroenterology;;   BRONCHIAL BRUSHINGS  08/03/2021   Procedure: BRONCHIAL BRUSHINGS;  Surgeon: Leslye Peer, MD;  Location: [facility] ENDOSCOPY;  Service: Cardiopulmonary;;   BRONCHIAL WASHINGS  08/03/2021   Procedure: BRONCHIAL WASHINGS;  Surgeon: Leslye Peer, MD;  Location: [facility] ENDOSCOPY;  Service: Cardiopulmonary;;   cosmetic surgeries     ESOPHAGEAL MANOMETRY N/A 12/02/2015   Procedure: ESOPHAGEAL MANOMETRY (EM);  Surgeon: Willis Modena, MD;  Location: [facility] ENDOSCOPY;  Service: Endoscopy;  Laterality: N/A;   ESOPHAGOGASTRODUODENOSCOPY N/A 12/02/2015   Procedure: ESOPHAGOGASTRODUODENOSCOPY (EGD);  Surgeon: Willis Modena, MD;  Location: [facility] ENDOSCOPY;  Service: Endoscopy;  Laterality: N/A;   FLEXIBLE SIGMOIDOSCOPY N/A 03/10/2022   Procedure: FLEXIBLE SIGMOIDOSCOPY;  Surgeon: Vida Rigger, MD;  Location: [facility] ENDOSCOPY;  Service: Gastroenterology;  Laterality: N/A;   lymph node removal left leg     cat scratch surgery    ORIF ANKLE FRACTURE Right 09/03/2021  Procedure: OPEN REDUCTION INTERNAL FIXATION (ORIF) ANKLE FRACTURE;  Surgeon: Joen Laura, MD;  Location: [facility] OR;  Service: Orthopedics;  Laterality: Right;   THROAT SURGERY     sleep apnea surgery    TONSILLECTOMY     VIDEO BRONCHOSCOPY  08/03/2021   Procedure: VIDEO BRONCHOSCOPY WITH FLUORO;  Surgeon: Leslye Peer, MD;  Location: [facility] ENDOSCOPY;  Service: Cardiopulmonary;;     A IV Location/Drains/Wounds Patient Lines/Drains/Airways  Status     Active Line/Drains/Airways     Name Placement date Placement time Site Days   Peripheral IV 10/04/22 20 G Left Antecubital 10/04/22  2251  Antecubital  1            Intake/Output Last 24 hours  Intake/Output Summary (Last 24 hours) at 10/05/2022 0755 Last data filed at 10/05/2022 0225 Gross per 24 hour  Intake 100 ml  Output --  Net 100 ml    Labs/Imaging Results for orders placed or performed during the hospital encounter of 10/04/22 (from the past 48 hour(s))  CBC with Differential     Status: Abnormal   Collection Time: 10/04/22  7:48 PM  Result Value Ref Range   WBC 4.8 4.0 - 10.5 K/uL   RBC 4.29 3.87 - 5.11 MIL/uL   Hemoglobin 11.3 (L) 12.0 - 15.0 g/dL   HCT 16.1 09.6 - 04.5 %   MCV 84.6 80.0 - 100.0 fL   MCH 26.3 26.0 - 34.0 pg   MCHC 31.1 30.0 - 36.0 g/dL   RDW 40.9 81.1 - 91.4 %   Platelets 236 150 - 400 K/uL   nRBC 0.0 0.0 - 0.2 %   Neutrophils Relative % 57 %   Neutro Abs 2.8 1.7 - 7.7 K/uL   Lymphocytes Relative 23 %   Lymphs Abs 1.1 0.7 - 4.0 K/uL   Monocytes Relative 18 %   Monocytes Absolute 0.9 0.1 - 1.0 K/uL   Eosinophils Relative 1 %   Eosinophils Absolute 0.1 0.0 - 0.5 K/uL   Basophils Relative 0 %   Basophils Absolute 0.0 0.0 - 0.1 K/uL   Immature Granulocytes 1 %   Abs Immature Granulocytes 0.04 0.00 - 0.07 K/uL    Comment: Performed at [facility], [address] [name], [place] [address]  Comprehensive metabolic panel     Status: Abnormal   Collection Time: 10/04/22  7:48 PM  Result Value Ref Range   Sodium 139 135 - 145 mmol/L   Potassium 3.2 (L) 3.5 - 5.1 mmol/L   Chloride 97 (L) 98 - 111 mmol/L   CO2 26 22 - 32 mmol/L   Glucose, Bld 237 (H) 70 - 99 mg/dL    Comment: Glucose reference range applies only to samples taken after fasting for at least 8 hours.   BUN 8 8 - 23 mg/dL   Creatinine, Ser 6.21 (H) 0.44 - 1.00 mg/dL   Calcium 9.1 8.9 - 30.8 mg/dL   Total Protein 5.8 (L) 6.5 - 8.1 g/dL   Albumin 3.4 (L) 3.5 -  5.0 g/dL   AST 28 15 - 41 U/L   ALT 31 0 - 44 U/L   Alkaline Phosphatase 81 38 - 126 U/L   Total Bilirubin 0.8 0.3 - 1.2 mg/dL   GFR, Estimated 44 (L) >60 mL/min    Comment: (NOTE) Calculated using the CKD-EPI Creatinine Equation (2021)    Anion gap 16 (H) 5 - 15    Comment: Performed at [facility], [address]  [place], [place] [address]  Troponin I (High Sensitivity)     Status: None   Collection Time: 10/04/22  7:48 PM  Result Value Ref Range   Troponin I (High Sensitivity) 11 <18 ng/L    Comment: (NOTE) Elevated high sensitivity troponin I (hsTnI) values and significant  changes across serial measurements may suggest ACS but many other  chronic and acute conditions are known to elevate hsTnI results.  Refer to the "Links" section for chest pain algorithms and additional  guidance. Performed at [facility], [address], [place], [place] [address]   Brain natriuretic peptide     Status: None   Collection Time: 10/04/22  7:48 PM  Result Value Ref Range   B Natriuretic Peptide 72.9 0.0 - 100.0 pg/mL    Comment: Performed at [facility], [address], [place], [place] [address]  Magnesium     Status: Abnormal   Collection Time: 10/04/22  7:48 PM  Result Value Ref Range   Magnesium 1.3 (L) 1.7 - 2.4 mg/dL    Comment: Performed at [facility], [address] [place], [place] [address]  SARS Coronavirus 2 by RT PCR (hospital order, performed in [facility] hospital lab) *cepheid single result test* Anterior Nasal Swab     Status: None   Collection Time: 10/04/22 10:21 PM   Specimen: Anterior Nasal Swab  Result Value Ref Range   SARS Coronavirus 2 by RT PCR NEGATIVE NEGATIVE    Comment: Performed at [facility], [address], [place], [place] [address]  Beta-hydroxybutyric acid     Status: None   Collection Time: 10/04/22 11:55 PM  Result Value Ref Range   Beta-Hydroxybutyric Acid 0.16 0.05 - 0.27 mmol/L    Comment: Performed at  [facility], [address] [place], [place] [address]  Troponin I (High Sensitivity)     Status: None   Collection Time: 10/04/22 11:55 PM  Result Value Ref Range   Troponin I (High Sensitivity) 10 <18 ng/L    Comment: (NOTE) Elevated high sensitivity troponin I (hsTnI) values and significant  changes across serial measurements may suggest ACS but many other  chronic and acute conditions are known to elevate hsTnI results.  Refer to the "Links" section for chest pain algorithms and additional  guidance. Performed at [facility], [address] [place], [place] [address]   I-Stat venous blood gas, [facility], [facility], [facility])     Status: Abnormal   Collection Time: 10/05/22 12:03 AM  Result Value Ref Range   pH, Ven 7.390 7.25 - 7.43   pCO2, Ven 52.8 44 - 60 mmHg   pO2, Ven 29 (LL) 32 - 45 mmHg   Bicarbonate 32.0 (H) 20.0 - 28.0 mmol/L   TCO2 34 (H) 22 - 32 mmol/L   O2 Saturation 53 %   Acid-Base Excess 6.0 (H) 0.0 - 2.0 mmol/L   Sodium 140 135 - 145 mmol/L   Potassium 3.2 (L) 3.5 - 5.1 mmol/L   Calcium, Ion 1.14 (L) 1.15 - 1.40 mmol/L   HCT 28.0 (L) 36.0 - 46.0 %   Hemoglobin 9.5 (L) 12.0 - 15.0 g/dL   Sample type VENOUS    Comment NOTIFIED PHYSICIAN    CT Angio Chest PE W/Cm &/Or Wo Cm  Result Date: 10/05/2022 CLINICAL DATA:  Pulmonary embolism (PE) suspected, high prob. Back pain. EXAM: CT ANGIOGRAPHY CHEST WITH CONTRAST TECHNIQUE: Multidetector CT imaging of the chest was performed using  the standard protocol during bolus administration of intravenous contrast. Multiplanar CT image reconstructions and MIPs were obtained to evaluate the vascular anatomy. RADIATION DOSE REDUCTION: This exam was performed according to the departmental dose-optimization program which includes automated exposure control, adjustment of the mA and/or kV according to patient size and/or use of iterative reconstruction technique. CONTRAST:  75mL OMNIPAQUE IOHEXOL 350 MG/ML SOLN COMPARISON:  MRI  10/03/2022 FINDINGS: Cardiovascular: No filling defects in the pulmonary arteries to suggest pulmonary emboli. Heart is normal size. Aorta is normal caliber. Coronary artery and aortic calcifications. Mediastinum/Nodes: No mediastinal, hilar, or axillary adenopathy. Esophagus is patulous, dilated and fluid-filled. This is unchanged since prior study. Trachea and thyroid unremarkable. Lungs/Pleura: Medial left apical nodule again noted measuring approximately 9 mm compared to 10 mm previously. Linear densities in the lingula are stable, likely scarring. No acute confluent opacities or effusions. 7 mm inferior lingular nodule on image 80 is unchanged since prior study. Upper Abdomen: Calcifications in the spleen and liver compatible with old granulomatous disease. No acute findings. Musculoskeletal: Chest wall soft tissues are unremarkable. Bilateral breast implants grossly unremarkable. Moderate to severe compression fracture noted at T8 as seen on prior MRI, unchanged. Stable mild compression fracture through the superior endplate of T12. Review of the MIP images confirms the above findings. IMPRESSION: No evidence of pulmonary embolus. Stable left upper lobe nodules measuring up to 9 mm. Consider follow-up imaging in 6-12 months to assess stability. Patulous, fluid-filled esophagus is unchanged. Coronary artery disease. No acute cardiopulmonary disease. Aortic Atherosclerosis (ICD10-I70.0). Electronically Signed   By: Charlett Nose M.D.   On: 10/05/2022 00:09   DG Chest Port 1 View  Result Date: 10/04/2022 CLINICAL DATA:  Shortness of breath. EXAM: PORTABLE CHEST 1 VIEW COMPARISON:  10/02/2022, CT 09/26/2022 FINDINGS: Improved lung aeration. Stable heart size and mediastinal contours. Minor atelectasis at the left lung base, no confluent consolidation. No pleural fluid or pneumothorax. No pulmonary edema. Left apical nodule on CT not seen by radiograph IMPRESSION: Minor left basilar atelectasis. Electronically  Signed   By: Narda Rutherford M.D.   On: 10/04/2022 22:50    Pending Labs Unresulted Labs (From admission, onward)     Start     Ordered   10/12/22 0500  Creatinine, serum  (enoxaparin (LOVENOX)    CrCl >/= 30 ml/min)  Weekly,   R     Comments: while on enoxaparin therapy    10/05/22 0730   10/06/22 0500  Basic metabolic panel  Tomorrow morning,   R        10/05/22 0730   10/05/22 0728  HIV Antibody (routine testing w rflx)  (HIV Antibody (Routine testing w reflex) panel)  Once,   R        10/05/22 0730   10/05/22 0728  CBC  (enoxaparin (LOVENOX)    CrCl >/= 30 ml/min)  Once,   R       Comments: Baseline for enoxaparin therapy IF NOT ALREADY DRAWN.  Notify MD if PLT < 100 K.    10/05/22 0730   10/05/22 0728  Creatinine, serum  (enoxaparin (LOVENOX)    CrCl >/= 30 ml/min)  Once,   R       Comments: Baseline for enoxaparin therapy IF NOT ALREADY DRAWN.    10/05/22 0730   10/05/22 0727  Magnesium  Once,   STAT        10/05/22 0726   10/05/22 0727  Phosphorus  Once,   STAT  10/05/22 0726   10/04/22 2301  Urinalysis, Routine w reflex microscopic -Urine, Clean Catch  Once,   URGENT       Question:  Specimen Source  Answer:  Urine, Clean Catch   10/04/22 2300            Vitals/Pain Today's Vitals   10/05/22 0222 10/05/22 0400 10/05/22 0655 10/05/22 0715  BP:  (!) 161/100 (!) 157/90 (!) 156/93  Pulse:  (!) 104 100 (!) 109  Resp:  20 20 16   Temp: 97.7 F (36.5 C)   97.7 F (36.5 C)  TempSrc: Oral   Oral  SpO2:  94% 95% 99%  Weight:      Height:      PainSc:  4   8     Isolation Precautions No active isolations  Medications Medications  lidocaine (LIDODERM) 5 % 1 patch (1 patch Transdermal Patch Applied 10/05/22 0728)  methocarbamol (ROBAXIN) 500 mg in dextrose 5 % 50 mL IVPB (has no administration in time range)  HYDROmorphone (DILAUDID) injection 0.5 mg (0.5 mg Intravenous Given 10/05/22 0728)  pantoprazole (PROTONIX) injection 40 mg (has no administration in  time range)  enoxaparin (LOVENOX) injection 40 mg (has no administration in time range)  sodium chloride flush (NS) 0.9 % injection 3 mL (has no administration in time range)  acetaminophen (TYLENOL) tablet 650 mg (has no administration in time range)    Or  acetaminophen (TYLENOL) suppository 650 mg (has no administration in time range)  ondansetron (ZOFRAN) tablet 4 mg (has no administration in time range)    Or  ondansetron (ZOFRAN) injection 4 mg (has no administration in time range)  ondansetron (ZOFRAN-ODT) disintegrating tablet 4 mg (4 mg Oral Given 10/04/22 1953)  HYDROmorphone (DILAUDID) injection 1 mg (1 mg Intravenous Given 10/04/22 2254)  ondansetron (ZOFRAN) injection 4 mg (4 mg Intravenous Given 10/04/22 2252)  sodium chloride 0.9 % bolus 500 mL (0 mLs Intravenous Stopped 10/05/22 0038)  potassium chloride SA (KLOR-CON M) CR tablet 40 mEq (40 mEq Oral Given 10/04/22 2255)  iohexol (OMNIPAQUE) 350 MG/ML injection 75 mL (75 mLs Intravenous Contrast Given 10/04/22 2349)  magnesium oxide (MAG-OX) tablet 400 mg (400 mg Oral Given 10/05/22 0037)  cefTRIAXone (ROCEPHIN) 1 g in sodium chloride 0.9 % 100 mL IVPB (0 g Intravenous Stopped 10/05/22 0225)    Mobility walks     Focused Assessments     R Recommendations: See Admitting Provider Note  Report given to:   Additional Notes:

## 2022-10-05 NOTE — Progress Notes (Signed)
PT Cancellation Note  Patient Details Name: Meghan Alexander MRN: [id number] DOB: January 03, 1956   Cancelled Treatment:    Reason Eval/Treat Not Completed: Patient at procedure or test/unavailable (IR)  Lillia Pauls, PT, DPT Acute Rehabilitation Services Office [phone]    Norval Morton 10/05/2022, 2:42 PM

## 2022-10-05 NOTE — Discharge Instructions (Signed)
INR.  1.  No stooping, bending or lifting weights above 10 pounds for 2 weeks.  2.  Use walker to ambulate for 2 weeks.  3.  No driving for 2 weeks.  4.  Call referring MDs office in the next 2 to 3 days for core biopsy results.  Fatima Sanger MD

## 2022-10-06 ENCOUNTER — Inpatient Hospital Stay (HOSPITAL_COMMUNITY): Payer: Medicare Other

## 2022-10-06 DIAGNOSIS — M7989 Other specified soft tissue disorders: Secondary | ICD-10-CM | POA: Diagnosis not present

## 2022-10-06 DIAGNOSIS — R112 Nausea with vomiting, unspecified: Secondary | ICD-10-CM

## 2022-10-06 DIAGNOSIS — R600 Localized edema: Secondary | ICD-10-CM | POA: Diagnosis not present

## 2022-10-06 LAB — BASIC METABOLIC PANEL
Anion gap: 9 (ref 5–15)
BUN: 6 mg/dL — ABNORMAL LOW (ref 8–23)
CO2: 26 mmol/L (ref 22–32)
Calcium: 8.3 mg/dL — ABNORMAL LOW (ref 8.9–10.3)
Chloride: 104 mmol/L (ref 98–111)
Creatinine, Ser: 1.08 mg/dL — ABNORMAL HIGH (ref 0.44–1.00)
GFR, Estimated: 56 mL/min — ABNORMAL LOW (ref >60)
Glucose, Bld: 127 mg/dL — ABNORMAL HIGH (ref 70–99)
Potassium: 3.2 mmol/L — ABNORMAL LOW (ref 3.5–5.1)
Sodium: 139 mmol/L (ref 135–145)

## 2022-10-06 LAB — ECHOCARDIOGRAM COMPLETE
Area-P 1/2: 4.8 cm2
Calc EF: 54 %
Height: 62 in
MV M vel: 4.72 m/s
MV Peak grad: 89.1 mmHg
S' Lateral: 3.2 cm
Single Plane A2C EF: 54.7 %
Single Plane A4C EF: 54.5 %
Weight: 2821.89 oz

## 2022-10-06 LAB — GLUCOSE, CAPILLARY
Glucose-Capillary: 110 mg/dL — ABNORMAL HIGH (ref 70–99)
Glucose-Capillary: 113 mg/dL — ABNORMAL HIGH (ref 70–99)
Glucose-Capillary: 124 mg/dL — ABNORMAL HIGH (ref 70–99)
Glucose-Capillary: 139 mg/dL — ABNORMAL HIGH (ref 70–99)
Glucose-Capillary: 164 mg/dL — ABNORMAL HIGH (ref 70–99)
Glucose-Capillary: 181 mg/dL — ABNORMAL HIGH (ref 70–99)

## 2022-10-06 LAB — CBC
HCT: 32.4 % — ABNORMAL LOW (ref 36.0–46.0)
Hemoglobin: 9.9 g/dL — ABNORMAL LOW (ref 12.0–15.0)
MCH: 26.1 pg (ref 26.0–34.0)
MCHC: 30.6 g/dL (ref 30.0–36.0)
MCV: 85.5 fL (ref 80.0–100.0)
Platelets: 244 10*3/uL (ref 150–400)
RBC: 3.79 MIL/uL — ABNORMAL LOW (ref 3.87–5.11)
RDW: 15.9 % — ABNORMAL HIGH (ref 11.5–15.5)
WBC: 3.6 10*3/uL — ABNORMAL LOW (ref 4.0–10.5)
nRBC: 0 % (ref 0.0–0.2)

## 2022-10-06 LAB — MAGNESIUM: Magnesium: 1.6 mg/dL — ABNORMAL LOW (ref 1.7–2.4)

## 2022-10-06 MED ORDER — MAGNESIUM SULFATE 2 GM/50ML IV SOLN
2.0000 g | Freq: Once | INTRAVENOUS | Status: AC
  Administered 2022-10-06: 2 g via INTRAVENOUS
  Filled 2022-10-06: qty 50

## 2022-10-06 MED ORDER — POTASSIUM CHLORIDE CRYS ER 20 MEQ PO TBCR
40.0000 meq | EXTENDED_RELEASE_TABLET | Freq: Once | ORAL | Status: AC
  Administered 2022-10-06: 40 meq via ORAL
  Filled 2022-10-06: qty 2

## 2022-10-06 MED ORDER — FUROSEMIDE 10 MG/ML IJ SOLN
40.0000 mg | Freq: Once | INTRAMUSCULAR | Status: AC
  Administered 2022-10-06: 40 mg via INTRAVENOUS
  Filled 2022-10-06: qty 4

## 2022-10-06 MED ORDER — OXYCODONE HCL 5 MG PO TABS
5.0000 mg | ORAL_TABLET | ORAL | Status: DC | PRN
  Administered 2022-10-06 – 2022-10-08 (×8): 5 mg via ORAL
  Filled 2022-10-06 (×9): qty 1

## 2022-10-06 MED ORDER — PERFLUTREN LIPID MICROSPHERE
1.0000 mL | INTRAVENOUS | Status: AC | PRN
  Administered 2022-10-06: 2 mL via INTRAVENOUS

## 2022-10-06 NOTE — Progress Notes (Signed)
BLE venous duplex has been completed.   Results can be found under chart review under CV PROC. 10/06/2022 1:26 PM Tayloranne Lekas RVT, RDMS

## 2022-10-06 NOTE — Progress Notes (Signed)
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  Workup in the emergency department showed subacute T8 compression fracture.  Lab also showed AKI, hypokalemia.  Also was complaining of nausea and vomiting.  IR was consulted for compression fracture, underwent vertebroplasty on 6/12.  Consulted PT/OT  Assessment & Plan:  Principal Problem:   Intractable nausea and vomiting  Intractable nausea/vomiting: Likely secondary to oral pain medications.  She will take Dilaudid for uncontrolled pain.  Minimize narcotics.  Continue antiemetics.  Given gentle IV fluids. Nausea and vomiting have resolved this morning. CT imaging showed  patulous, fluid-filled esophagus. Will request for speech therapy evaluation . Patient has history of achalasia and follows with [facility], Dr. Dulce Sellar  AKI: Baseline creatinine normal.  Presented with creatinine in the range of 1.3.  Resolved with IV fluids  Hypokalemia/hypomagnesemia: Currently being supplemented and monitored.  Known T8 compression fracture: No focal neurological deficits.  Patient was taking oral Dilaudid at home.  Minimize narcotics.  Continue lidocaine patch, Robaxin.  PT/OT consulted.  IR consulted and underwent vertebroplasty on 6/12.  Apply TLSO brace when out of bed.  Bilateral lower extremity edema: Ongoing for months.  Echo shows EF of 55 to 60%, indeterminate diastolic filling.  Will give her dose of Lasix 40 mg once.  Will try compression stockings if swelling persist.  Venous Doppler pending  Diabetes type 2: On metformin at home.  Currently on sliding scale.  Monitor blood sugars  Hypertension: Hypertensive on  presentation.  Monitor blood pressure, continue current medications  Crohns disease: Recently diagnosed.  Patient was started on Cyltezo-CD recently.  No history of hematochezia  History of follicular lymphoma: Follows with Dr. Candise Che as an outpatient.  On chronic steroid therapy.  Lung nodule: CT imaging showed stable left upper lobe nodules measuring up to 9 mm.we recommend follow-up imaging in 6-12 months to assess stability.       DVT prophylaxis:enoxaparin (LOVENOX) injection 40 mg Start: 10/05/22 1000     Code Status: Full Code  Family Communication: None at bedside  Patient status:Inpatient  Patient is from :Home  Anticipated discharge OZ:HYQM  Estimated DC date:1-2 days   Consultants: none  Procedures:None  Antimicrobials:  Anti-infectives (From admission, onward)    Start     Dose/Rate Route Frequency Ordered Stop   10/05/22 1452  ceFAZolin (ANCEF) IVPB 2g/100 mL premix        over 30 Minutes Intravenous Continuous PRN 10/05/22 1518 10/05/22 1452   10/05/22 0045  cefTRIAXone (ROCEPHIN) 1 g in sodium chloride 0.9 % 100 mL IVPB        1 g 200 mL/hr over 30 Minutes Intravenous  Once 10/05/22 0044 10/05/22 0225       Subjective: Patient seen and examined at bedside today.  She was hemodynamically stable.  She was lying on the bed.  Echo was being done.  She has lower extremity edema but they have improved since yesterday.  Continues to complain of severe back pain.  Objective: Vitals:   10/05/22 1654 10/05/22 1943 10/06/22 0431 10/06/22 0756  BP: (!) 142/84 134/70 (!) 151/82 (!) 162/96  Pulse: (!) 104  (!) 102 (!) 107  Resp: 18 15 15    Temp: (!) 97.4 F (36.3 C)  98 F (36.7 C) 98.2 F (36.8 C) 97.7 F (36.5 C)  TempSrc: Oral     SpO2: 98% 100% 96% 99%  Weight:      Height:        Intake/Output Summary (Last 24 hours) at 10/06/2022 0801 Last data filed at 10/06/2022 0436 Gross per 24 hour  Intake 764.42 ml  Output --  Net 764.42 ml   Filed  Weights   10/04/22 2204  Weight: 80 kg    Examination:  General exam: Overall comfortable, not in distress, obese HEENT: PERRL Respiratory system:  no wheezes or crackles  Cardiovascular system: S1 & S2 heard, RRR.  Gastrointestinal system: Abdomen is nondistended, soft and nontender. Central nervous system: Alert and oriented Extremities: Bilateral lower extremity 1-2+  pitting edema, no clubbing ,no cyanosis Skin: No rashes, no ulcers,no icterus     Data Reviewed: I have personally reviewed following labs and imaging studies  CBC: Recent Labs  Lab 10/02/22 2152 10/04/22 1948 10/05/22 0003 10/05/22 0934 10/06/22 0412  WBC 3.4* 4.8  --  4.2 3.6*  NEUTROABS  --  2.8  --   --   --   HGB 10.7* 11.3* 9.5* 11.1* 9.9*  HCT 34.0* 36.3 28.0* 35.3* 32.4*  MCV 85.9 84.6  --  86.7 85.5  PLT 202 236  --  231 244   Basic Metabolic Panel: Recent Labs  Lab 10/02/22 2152 10/04/22 1948 10/05/22 0003 10/05/22 0934 10/05/22 1359 10/06/22 0412  NA 141 139 140  --  143 139  K 3.6 3.2* 3.2*  --  3.1* 3.2*  CL 100 97*  --   --  100 104  CO2 27 26  --   --  27 26  GLUCOSE 203* 237*  --   --  78 127*  BUN 12 8  --   --  6* 6*  CREATININE 1.18* 1.31*  --  1.06* 1.03* 1.08*  CALCIUM 8.8* 9.1  --   --  9.2 8.3*  MG  --  1.3*  --  1.8 1.8 1.6*  PHOS  --   --   --  4.1 3.5  --      Recent Results (from the past 240 hour(s))  SARS Coronavirus 2 by RT PCR (hospital order, performed in [facility] hospital lab) *cepheid single result test* Anterior Nasal Swab     Status: None   Collection Time: 10/04/22 10:21 PM   Specimen: Anterior Nasal Swab  Result Value Ref Range Status   SARS Coronavirus 2 by RT PCR NEGATIVE NEGATIVE Final    Comment: Performed at [facility], [address], [place], [place] [address]     Radiology Studies: CT Angio Chest PE W/Cm &/Or Wo Cm  Result Date: 10/05/2022 CLINICAL DATA:  Pulmonary embolism (PE) suspected, high prob. Back pain. EXAM: CT  ANGIOGRAPHY CHEST WITH CONTRAST TECHNIQUE: Multidetector CT imaging of the chest was performed using the standard protocol during bolus administration of intravenous contrast. Multiplanar CT image reconstructions and MIPs were obtained to evaluate the vascular anatomy. RADIATION DOSE REDUCTION: This exam was performed according to the departmental dose-optimization program which includes automated exposure control, adjustment of the mA and/or kV according to patient size and/or use of iterative reconstruction technique. CONTRAST:  75mL OMNIPAQUE IOHEXOL 350 MG/ML SOLN COMPARISON:  MRI 10/03/2022 FINDINGS: Cardiovascular: No filling defects in the pulmonary arteries to suggest pulmonary emboli. Heart is normal size. Aorta is normal caliber. Coronary artery and aortic calcifications. Mediastinum/Nodes: No mediastinal, hilar,  or axillary adenopathy. Esophagus is patulous, dilated and fluid-filled. This is unchanged since prior study. Trachea and thyroid unremarkable. Lungs/Pleura: Medial left apical nodule again noted measuring approximately 9 mm compared to 10 mm previously. Linear densities in the lingula are stable, likely scarring. No acute confluent opacities or effusions. 7 mm inferior lingular nodule on image 80 is unchanged since prior study. Upper Abdomen: Calcifications in the spleen and liver compatible with old granulomatous disease. No acute findings. Musculoskeletal: Chest wall soft tissues are unremarkable. Bilateral breast implants grossly unremarkable. Moderate to severe compression fracture noted at T8 as seen on prior MRI, unchanged. Stable mild compression fracture through the superior endplate of T12. Review of the MIP images confirms the above findings. IMPRESSION: No evidence of pulmonary embolus. Stable left upper lobe nodules measuring up to 9 mm. Consider follow-up imaging in 6-12 months to assess stability. Patulous, fluid-filled esophagus is unchanged. Coronary artery disease. No acute  cardiopulmonary disease. Aortic Atherosclerosis (ICD10-I70.0). Electronically Signed   By: Charlett Nose M.D.   On: 10/05/2022 00:09   DG Chest Port 1 View  Result Date: 10/04/2022 CLINICAL DATA:  Shortness of breath. EXAM: PORTABLE CHEST 1 VIEW COMPARISON:  10/02/2022, CT 09/26/2022 FINDINGS: Improved lung aeration. Stable heart size and mediastinal contours. Minor atelectasis at the left lung base, no confluent consolidation. No pleural fluid or pneumothorax. No pulmonary edema. Left apical nodule on CT not seen by radiograph IMPRESSION: Minor left basilar atelectasis. Electronically Signed   By: Narda Rutherford M.D.   On: 10/04/2022 22:50    Scheduled Meds:  enoxaparin (LOVENOX) injection  40 mg Subcutaneous Q24H   insulin aspart  0-9 Units Subcutaneous Q4H   ketorolac  15 mg Intravenous Once   lidocaine  1 patch Transdermal Q24H   pantoprazole (PROTONIX) IV  40 mg Intravenous Q12H   potassium chloride  40 mEq Oral Once   sodium chloride flush  3 mL Intravenous Q12H   Continuous Infusions:  dextrose 5 % and 0.45 % NaCl Stopped (10/05/22 1724)   magnesium sulfate bolus IVPB     methocarbamol (ROBAXIN) IV       LOS: 1 day   Burnadette Pop, MD [facility] P6/13/2024, 8:01 AM

## 2022-10-06 NOTE — TOC Initial Note (Signed)
Transition of Care [facility]) - Initial/Assessment Note    Patient Details  Name: Meghan Alexander MRN: [id number] Date of Birth: Jan 28, 1956  Transition of Care [facility]) CM/SW Contact:    Janae Bridgeman, RN Phone Number: 10/06/2022, 3:24 PM  Clinical Narrative:                 CM met with the patient at the bedside to discuss TOC needs.  The patient lives at home with her spouse and admitted to the hospital with gastrointestinal obstruction.  The patient has a RW and [employer] Glucose monitoring system.  The patient was provided with Medicare choice regarding home health services and she does not have a preference.  I called Zoll[facility] Scale and Cindy, CM accepted for PT services.  Home health order was placed to be co-signed by the attending MD.  The patient's husband will provide transportation home by car when medically stable to discharge to home.  Expected Discharge Plan: Home w Home Health Services Barriers to Discharge: Continued Medical Work up   Patient Goals and CMS Choice Patient states their goals for this hospitalization and ongoing recovery are:: To return home CMS Medicare.gov Compare Post Acute Care list provided to:: Patient Choice offered to / list presented to : Patient  ownership interest in [facility].provided to:: Patient    Expected Discharge Plan and Services   Discharge Planning Services: CM Consult Post Acute Care Choice: Home Health Living arrangements for the past 2 months: Single Family Home                           HH Arranged: PT HH Agency: [facility] Date [facility] Agency Contacted: 10/06/22 Time HH Agency Contacted: 1523 Representative spoke with at [facility] Agency: [facility] accepted for [facility] PT  Prior Living Arrangements/Services Living arrangements for the past 2 months: Single Family Home Lives with:: Spouse Patient language and need for interpreter reviewed:: Yes Do you feel safe going back to the place where you live?: Yes       Need for Family Participation in Patient Care: Yes (Comment) Care giver support system in place?: Yes (comment) Current home services: DME (RW and [employer] glucose monitoring system) Criminal Activity/Legal Involvement Pertinent to Current Situation/Hospitalization: No - Comment as needed  Activities of Daily Living Home Assistive Devices/Equipment: None ADL Screening (condition at time of admission) Patient's cognitive ability adequate to safely complete daily activities?: No Is the patient deaf or have difficulty hearing?: No Does the patient have difficulty seeing, even when wearing glasses/contacts?: No Does the patient have difficulty concentrating, remembering, or making decisions?: No Patient able to express need for assistance with ADLs?: No Does the patient have difficulty dressing or bathing?: No Independently performs ADLs?: Yes (appropriate for developmental age) Does the patient have difficulty walking or climbing stairs?: Yes Weakness of Legs: None Weakness of Arms/Hands: None  Permission Sought/Granted Permission sought to share information with : Case Manager, Family Supports, Photographer granted to share info w AGENCY: [facility] accepted for [facility] PT Services        Emotional Assessment Appearance:: Appears stated age Attitude/Demeanor/Rapport: Gracious Affect (typically observed): Accepting Orientation: : Oriented to Self, Oriented to Place, Oriented to  Time, Oriented to Situation Alcohol / Substance Use: Not Applicable Psych Involvement: No (comment)  Admission diagnosis:  Cellulitis of right lower extremity [L03.115] AKI (acute kidney injury) (HCC) [N17.9] Intractable nausea and  vomiting [R11.2] Midline thoracic back pain, unspecified chronicity [M54.6] Patient Active Problem List   Diagnosis Date Noted   Intractable nausea and vomiting 10/05/2022   Diarrhea 07/28/2022   Infectious colitis 03/07/2022   Chronic  diarrhea 03/02/2022   Chronic diarrhea of unknown origin 03/01/2022   ILD (interstitial lung disease) (HCC) 10/10/2021   Atrial fibrillation with RVR (HCC) 10/08/2021   UTI (urinary tract infection) 09/07/2021   Hypoxia 09/06/2021   Closed right ankle fracture 09/03/2021   Pneumonitis 08/26/2021   Exertional dyspnea 08/07/2021   Sinus tachycardia 08/07/2021   Essential hypertension 08/07/2021   Dyspnea on exertion 08/03/2021   Uncontrolled type 2 diabetes mellitus with hyperglycemia (HCC) 08/03/2021   Dyspnea 08/02/2021   Acute respiratory failure with hypoxia (HCC) 08/02/2021   HTN (hypertension) 08/02/2021   Type 2 diabetes mellitus (HCC) 08/02/2021   Hypokalemia 08/02/2021   Achalasia 08/02/2021   Elevated serum free T4 level 05/21/2019   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   Counseling regarding advanced care planning and goals of care 04/02/2019   Sepsis-Biolateral PNa c Mod Risk PE in differential 10/17/2014   CAP (community acquired pneumonia) 10/17/2014   Pain in right foot 06/28/2013   Posterior tibial tendonitis of right leg 06/28/2013   PCP:  Burton Apley, MD Pharmacy:   [facility] [phone] - [place], [place] - [address] [facility] [address] [place] [address] Phone: [phone] Fax: [phone]     Social Determinants of Health (SDOH) Social History: SDOH Screenings   Food Insecurity: No Food Insecurity (10/05/2022)  Housing: Low Risk  (10/05/2022)  Transportation Needs: No Transportation Needs (10/05/2022)  Utilities: Not At Risk (10/05/2022)  Tobacco Use: Low Risk  (10/04/2022)   SDOH Interventions:     Readmission Risk Interventions    10/06/2022    3:24 PM 07/31/2022   11:35 AM 10/11/2021    1:21 PM  Readmission Risk Prevention Plan  Transportation Screening Complete Complete   PCP or Specialist Appt within 3-5 Days Complete Complete Complete  HRI or  Home Care Consult Complete Complete Complete  Social Work Consult for Recovery Care Planning/Counseling Complete Complete   Palliative Care Screening Not Applicable Not Applicable   Medication Review Oceanographer) Complete Complete Complete

## 2022-10-06 NOTE — Evaluation (Signed)
Physical Therapy Evaluation Patient Details Name: Meghan Alexander MRN: [id number] DOB: [birth year] Today's Date: 10/06/2022  History of Present Illness  67 yo female presents to [facility] on 6/9 with back pain x2 weeks, imaging shows subacute T7-8 compression fx, s/p T7-8 vertebroplasty on 6/12. PMH:  R ankle fx ORIF of ankle, T2DM, HTN, follicular lymphoma in remission, crohn's disease, pneumococcal PNA  Clinical Impression   Pt presents with generalized weakness, back pain, decreased knowledge and application of back precautions, impaired activity tolerance, and impaired balance. Pt to benefit from acute PT to address deficits. Pt ambulated hallway distance without AD at supervision level, per pt she uses RW intermittently at home. PT to progress mobility as tolerated, and will continue to follow acutely.         Recommendations for follow up therapy are one component of a multi-disciplinary discharge planning process, led by the attending physician.  Recommendations may be updated based on patient status, additional functional criteria and insurance authorization.  Follow Up Recommendations       Assistance Recommended at Discharge Intermittent Supervision/Assistance  Patient can return home with the following  A little help with walking and/or transfers;A little help with bathing/dressing/bathroom    Equipment Recommendations None recommended by PT  Recommendations for Other Services       Functional Status Assessment Patient has had a recent decline in their functional status and demonstrates the ability to make significant improvements in function in a reasonable and predictable amount of time.     Precautions / Restrictions Precautions Precautions: Fall;Back Precaution Comments: Reviewed log roll during bed mobility, BLT rules, brace application and use. Order states brace at all times. per Junious Silk NP via secure chat, who placed order, brace intended for when pt is up and  moving Required Braces or Orthoses: Spinal Brace Spinal Brace: Thoracolumbosacral orthotic;Applied in sitting position Restrictions Weight Bearing Restrictions: No      Mobility  Bed Mobility Overal bed mobility: Needs Assistance Bed Mobility: Rolling, Sidelying to Sit, Sit to Sidelying Rolling: Supervision Sidelying to sit: Supervision     Sit to sidelying: Supervision General bed mobility comments: use of bedrails, verbal cuing for log roll technique    Transfers Overall transfer level: Needs assistance Equipment used: None Transfers: Sit to/from Stand Sit to Stand: Supervision           General transfer comment: safety    Ambulation/Gait Ambulation/Gait assistance: Supervision Gait Distance (Feet): 250 Feet Assistive device: None Gait Pattern/deviations: Step-through pattern, Decreased stride length Gait velocity: decr     General Gait Details: for safety, x1 LOB but pt-corrected  Stairs            Wheelchair Mobility    Modified Rankin (Stroke Patients Only)       Balance Overall balance assessment: Needs assistance, History of Falls Sitting-balance support: Feet supported, No upper extremity supported Sitting balance-[name]: Fair     Standing balance support: No upper extremity supported Standing balance-[name]: Fair                               Pertinent Vitals/Pain Pain Assessment Pain Assessment: Faces Faces Pain Scale: Hurts even more Pain Location: back Pain Descriptors / Indicators: Sore, Discomfort, Grimacing Pain Intervention(s): Monitored during session, Repositioned, Limited activity within patient's tolerance    Home Living Family/patient expects to be discharged to:: Private residence Living Arrangements: Spouse/significant other Available Help at Discharge: Family;Available 24 hours/day Type of  Home: House Home Access: Stairs to enter   [employer] of Steps: 2 Alternate Level  Stairs-Number of Steps: chair lift Home Layout: Two level Home Equipment: Agricultural consultant (2 wheels);Shower seat;Grab bars - tub/shower;Grab bars - toilet;Rollator (4 wheels) Additional Comments: x3 walkers    Prior Function Prior Level of Function : Independent/Modified Independent             Mobility Comments: pt reports using RW as needed       Hand Dominance   Dominant Hand: Right    Extremity/Trunk Assessment   Upper Extremity Assessment Upper Extremity Assessment: Defer to OT evaluation    Lower Extremity Assessment Lower Extremity Assessment: Generalized weakness    Cervical / Trunk Assessment Cervical / Trunk Assessment: Back Surgery  Communication   Communication: No difficulties  Cognition Arousal/Alertness: Awake/alert Behavior During Therapy: Anxious Overall Cognitive Status: Impaired/Different from baseline Area of Impairment: Safety/judgement                         Safety/Judgement: Decreased awareness of deficits, Decreased awareness of safety     General Comments: decreased safety awareness, mobilizes without TLSO brace upon PT re-entry at end of session, pt states "whoops, bad girl" but does not attempt to don brace.        General Comments General comments (skin integrity, edema, etc.): discussed donning/doffing brace    Exercises     Assessment/Plan    PT Assessment Patient needs continued PT services  PT Problem List Decreased strength;Decreased mobility;Decreased activity tolerance;Decreased balance;Decreased knowledge of use of DME;Decreased safety awareness;Pain;Cardiopulmonary status limiting activity;Decreased knowledge of precautions       PT Treatment Interventions DME instruction;Therapeutic activities;Gait training;Therapeutic exercise;Patient/family education;Balance training;Stair training;Functional mobility training;Neuromuscular re-education    PT Goals (Current goals can be found in the Care Plan section)   Acute Rehab PT Goals PT Goal Formulation: With patient Time For Goal Achievement: 10/20/22 Potential to Achieve Goals: Good    Frequency Min 3X/week     Co-evaluation               AM-PAC PT "6 Clicks" Mobility  Outcome Measure Help needed turning from your back to your side while in a flat bed without using bedrails?: A Little Help needed moving from lying on your back to sitting on the side of a flat bed without using bedrails?: A Little Help needed moving to and from a bed to a chair (including a wheelchair)?: A Little Help needed standing up from a chair using your arms (e.g., wheelchair or bedside chair)?: A Little Help needed to walk in hospital room?: A Little Help needed climbing 3-5 steps with a railing? : A Little 6 Click Score: 18    End of Session Equipment Utilized During Treatment: Back brace Activity Tolerance: Patient tolerated treatment well;Patient limited by pain Patient left: in bed;with call bell/phone within reach;Other (comment) (sitting EOB, has been mobilizing in room without staff assist per pt and RN) Nurse Communication: Mobility status PT Visit Diagnosis: Other abnormalities of gait and mobility (R26.89)    Time: 1017-1040 PT Time Calculation (min) (ACUTE ONLY): 23 min   Charges:   PT Evaluation $PT Eval Low Complexity: 1 Low PT Treatments $Therapeutic Activity: 8-22 mins        Marye Round, PT DPT Acute Rehabilitation Services Secure Chat Preferred  Office [phone]    Bryna Razavi E Christain [place] 10/06/2022, 12:14 PM

## 2022-10-06 NOTE — Progress Notes (Signed)
Referring Physician(s): Junious Silk, NP   Supervising Physician: Julieanne Cotton  Patient Status:  [facility] - In-pt  Chief Complaint:  Upper back pain, possible T7/T8 compression fx S/p T8 VP by Dr. Corliss Skains on 6/12  - No true compression fx seen in T7 body during the procedure.   Subjective:  Patient seen with Dr. Corliss Skains.  She is sitting in bed, NAD.  Reports that her upper back pain is pretty bad, has not improve much since the procedure. She thinks it is combination of her back pain and the stiffness of the back brace, she had to take off the brace because it was getting too uncomfortable.  Has been able to ambulate to bathroom w/p walker or assistance.  Has not worked with PT today.    Allergies: Latex, Codeine, Atenolol, Other, Hydrocodone, and Oxycodone  Medications: Prior to Admission medications   Medication Sig Start Date End Date Taking? Authorizing Provider  BENADRYL ALLERGY 25 MG tablet Take 25 mg by mouth at bedtime.   Yes [provider]  CYLTEZO-CD/UC/HS STARTER 40 MG/0.8ML AJKT Inject 160 mLs into the skin every 14 (fourteen) days. Started 09/10/2022. 09/09/22  Yes [provider]  HUMALOG KWIKPEN 100 UNIT/ML KwikPen Inject 5 Units into the skin with breakfast, with lunch, and with evening meal. Patient taking differently: Inject 15 Units into the skin See admin instructions. Inject 15 units into the skin before breakfast and an additional 15 units once a day as needed for elevated BGL 10/11/21  Yes Littie Deeds, MD  insulin detemir (LEVEMIR) 100 UNIT/ML FlexPen Inject 10 Units into the skin daily. Patient taking differently: Inject 20 Units into the skin See admin instructions. Inject 20 units into the skin in the morning as needed for elevated BGL 10/11/21  Yes Littie Deeds, MD  losartan (COZAAR) 50 MG tablet Take 25 mg by mouth in the morning.   Yes [provider]  Melatonin 10 MG TABS Take 10 mg by mouth at bedtime.   Yes  [provider]  metFORMIN (GLUCOPHAGE) 500 MG tablet Take 500 mg by mouth daily with breakfast. 07/23/19  Yes [provider]  metoprolol succinate (TOPROL-XL) 25 MG 24 hr tablet Take 1 tablet (25 mg total) by mouth daily. 10/12/21  Yes Littie Deeds, MD  Multiple Vitamin (MULTIVITAMIN) tablet Take 1 tablet by mouth daily with breakfast.   Yes [provider]  omeprazole (PRILOSEC) 20 MG capsule Take 20 mg by mouth daily before breakfast. 11/28/20  Yes [provider]  ondansetron (ZOFRAN) 4 MG tablet Take 1 tablet (4 mg total) by mouth every 8 (eight) hours as needed for nausea or vomiting. 10/03/22  Yes Tilden Fossa, MD  oxyCODONE (ROXICODONE) 5 MG immediate release tablet Take 1 tablet (5 mg total) by mouth every 6 (six) hours as needed for up to 10 doses for severe pain. 09/26/22  Yes Mardene Sayer, MD  rosuvastatin (CRESTOR) 10 MG tablet Take 10 mg by mouth daily.   Yes [provider]  saccharomyces boulardii (FLORASTOR) 250 MG capsule Take 250 mg by mouth in the morning.   Yes [provider]  TURMERIC PO Take 1 capsule by mouth at bedtime.   Yes [provider]  venlafaxine XR (EFFEXOR-XR) 37.5 MG 24 hr capsule Take 37.5 mg by mouth daily with breakfast.   Yes [provider]  Accu-Chek Softclix Lancets lancets Use to test blood sugars up to 4 times daily as directed. 08/09/21   Drema Dallas, MD  Blood Glucose Monitoring Suppl (BLOOD GLUCOSE MONITOR SYSTEM) w/Device KIT Use to test blood sugars up to 4 times daily. 08/09/21   Drema Dallas, MD  Continuous Blood Gluc Sensor (FREESTYLE LIBRE 2 SENSOR) MISC Inject 1 Device into the skin every 14 (fourteen) days.    [provider]  diltiazem (CARDIZEM CD) 240 MG 24 hr capsule Take 1 capsule (240 mg total) by mouth daily. Patient not taking: Reported on 10/05/2022 10/11/21   Littie Deeds, MD  glucose blood (ACCU-CHEK GUIDE) test strip Use to test blood sugars up  to 4 times daily as directed. 08/09/21   Drema Dallas, MD  Insulin Pen Needle 32G X 4 MM MISC Use to inject insulin up to 4 times daily as directed. 08/09/21   Drema Dallas, MD  lidocaine (LIDODERM) 5 % Place 1 patch onto the skin daily. Remove & Discard patch within 12 hours or as directed by MD Patient not taking: Reported on 10/05/2022 09/26/22   Mardene Sayer, MD  predniSONE (DELTASONE) 10 MG tablet Take 20 mg by mouth daily. Patient not taking: Reported on 10/05/2022 09/07/22   [provider]     Vital Signs: BP (!) 162/96 (BP Location: Right Arm)   Pulse (!) 107   Temp 97.7 F (36.5 C)   Resp 15   Ht 5\' 2"  (1.575 m)   Wt 176 lb 5.9 oz (80 kg)   SpO2 99%   BMI 32.26 kg/m   Physical Exam Vitals reviewed.  Constitutional:      General: She is not in acute distress.    Appearance: She is not ill-appearing.  Pulmonary:     Effort: Pulmonary effort is normal.  Musculoskeletal:     Comments: Significant TTP at T8, mild TTP at T9  Neurological:     Mental Status: She is alert and oriented to person, place, and time.  Psychiatric:        Mood and Affect: Mood normal.        Behavior: Behavior normal.     Imaging: CT Angio Chest PE W/Cm &/Or Wo Cm  Result Date: 10/05/2022 CLINICAL DATA:  Pulmonary embolism (PE) suspected, high prob. Back pain. EXAM: CT ANGIOGRAPHY CHEST WITH CONTRAST TECHNIQUE: Multidetector CT imaging of the chest was performed using the standard protocol during bolus administration of intravenous contrast. Multiplanar CT image reconstructions and MIPs were obtained to evaluate the vascular anatomy. RADIATION DOSE REDUCTION: This exam was performed according to the departmental dose-optimization program which includes automated exposure control, adjustment of the mA and/or kV according to patient size and/or use of iterative reconstruction technique. CONTRAST:  75mL OMNIPAQUE IOHEXOL 350 MG/ML SOLN COMPARISON:  MRI 10/03/2022 FINDINGS:  Cardiovascular: No filling defects in the pulmonary arteries to suggest pulmonary emboli. Heart is normal size. Aorta is normal caliber. Coronary artery and aortic calcifications. Mediastinum/Nodes: No mediastinal, hilar, or axillary adenopathy. Esophagus is patulous, dilated and fluid-filled. This is unchanged since prior study. Trachea and thyroid unremarkable. Lungs/Pleura: Medial left apical nodule again noted measuring approximately 9 mm compared to 10 mm previously. Linear densities in the lingula are stable, likely scarring. No acute confluent opacities or effusions. 7 mm inferior lingular nodule on image 80 is unchanged since prior study. Upper Abdomen: Calcifications in the spleen and liver compatible with old granulomatous disease. No acute findings. Musculoskeletal: Chest wall soft tissues are unremarkable. Bilateral breast implants grossly unremarkable. Moderate to severe compression fracture noted at T8 as seen on prior MRI, unchanged. Stable mild compression fracture  through the superior endplate of T12. Review of the MIP images confirms the above findings. IMPRESSION: No evidence of pulmonary embolus. Stable left upper lobe nodules measuring up to 9 mm. Consider follow-up imaging in 6-12 months to assess stability. Patulous, fluid-filled esophagus is unchanged. Coronary artery disease. No acute cardiopulmonary disease. Aortic Atherosclerosis (ICD10-I70.0). Electronically Signed   By: Charlett Nose M.D.   On: 10/05/2022 00:09   DG Chest Port 1 View  Result Date: 10/04/2022 CLINICAL DATA:  Shortness of breath. EXAM: PORTABLE CHEST 1 VIEW COMPARISON:  10/02/2022, CT 09/26/2022 FINDINGS: Improved lung aeration. Stable heart size and mediastinal contours. Minor atelectasis at the left lung base, no confluent consolidation. No pleural fluid or pneumothorax. No pulmonary edema. Left apical nodule on CT not seen by radiograph IMPRESSION: Minor left basilar atelectasis. Electronically Signed   By: Narda Rutherford M.D.   On: 10/04/2022 22:50   MR THORACIC SPINE WO CONTRAST  Result Date: 10/03/2022 CLINICAL DATA:  Initial evaluation for acute myelopathy. EXAM: MRI THORACIC SPINE WITHOUT CONTRAST TECHNIQUE: Multiplanar, multisequence MR imaging of the thoracic spine was performed. No intravenous contrast was administered. COMPARISON:  Prior study from 09/26/2022. FINDINGS: Alignment: Examination somewhat degraded by motion artifact. Sigmoid scoliosis, with dominant left convex component. Mild exaggeration of the normal thoracic kyphosis. Trace degenerative anterolisthesis of T2 on T3 and T3 on T4. Vertebrae: Compression deformity involving the T8 vertebral body with up to 60% height loss and trace 2 mm bony retropulsion, subacute in appearance with some persistent marrow edema. Additional subacute compression fracture seen at the adjacent inferior endplate of T7 without significant height loss or bony retropulsion. Additional T12 endplate fracture is largely chronic in appearance without significant residual marrow edema. Otherwise, vertebral body height maintained. Underlying bone marrow signal intensity within normal limits. No worrisome osseous lesions. Cord: Evaluation of the thoracic cord is limited by motion. On sagittal T2 and STIR sequences, there is questionable cord signal change at the level of T8, which could reflect mild edema and/or contusion related to the adjacent fracture (series 4, image 11). This is not well seen on corresponding axial sequences. Finding is not entirely certain given motion artifact on this exam. Otherwise normal signal and morphology. Paraspinal and other soft tissues: Paraspinous soft tissues demonstrate no acute finding. Fluid-filled patulous esophagus noted. Disc levels: T1-2: Negative interspace. Left greater than right facet hypertrophy. No stenosis. T2-3: Mild disc bulge.  Left-sided facet hypertrophy.  No stenosis. T3-4: Right eccentric disc bulge with left greater than  right facet hypertrophy. No spinal stenosis. Mild right foraminal narrowing. Left neural foramen remains patent. T4-5: Mild disc bulge with right greater left facet hypertrophy. No spinal stenosis. Mild right foraminal narrowing. Left neural foramen remains patent. T5-6: Minimal disc bulge with bilateral facet hypertrophy. No spinal stenosis. Mild right foraminal narrowing. Left neural foramina remains patent. T6-7: Minimal disc bulge. Right-sided facet hypertrophy. No stenosis. T7-8: Negative interspace. Right greater left facet hypertrophy. No stenosis. T8-9: 2 mm bony retropulsion related to the T8 fracture. Underlying mild disc bulge with bilateral facet hypertrophy. Resultant mild spinal stenosis. Mild to moderate bilateral foraminal narrowing. T9-10: Negative interspace. Bilateral facet hypertrophy. No stenosis. T10-11: Minimal disc bulge. Bilateral facet hypertrophy. No significant stenosis. T11-12: Mild diffuse disc bulge. Bilateral facet hypertrophy. No significant stenosis. T12-L1: Diffuse disc bulge with bilateral facet hypertrophy. No significant stenosis. IMPRESSION: 1. Motion degraded exam. 2. Subacute compression fracture involving the T8 vertebral body with up to 60% height loss and trace 2 mm bony retropulsion. Resultant  mild spinal stenosis with mild to moderate bilateral foraminal narrowing at this level. 3. Question subtle cord signal change at the level of T8, which could reflect mild edema and/or contusion related to the adjacent fracture. Finding is not entirely certain given motion degradation on this exam. Correlation with physical exam recommended. 4. Additional subacute compression fracture involving the inferior endplate of T7 without significant height loss or retropulsion. 5. Chronic T12 fracture. 6. Underlying multilevel degenerative spondylosis and facet hypertrophy with resultant mild right foraminal narrowing at T3-4 through T5-6, with mild to moderate bilateral foraminal narrowing  at T8-9. Electronically Signed   By: Rise Mu M.D.   On: 10/03/2022 02:28   MR Cervical Spine Wo Contrast  Result Date: 10/03/2022 CLINICAL DATA:  Initial evaluation for acute myelopathy. EXAM: MRI CERVICAL SPINE WITHOUT CONTRAST TECHNIQUE: Multiplanar, multisequence MR imaging of the cervical spine was performed. No intravenous contrast was administered. COMPARISON:  Prior study from 10/10/2011. FINDINGS: Alignment: Examination degraded by motion artifact. Straightening with slight reversal of the normal cervical lordosis. Trace degenerative anterolisthesis of C2 on C3, C4 on C5, C6 on C7, and C7 on T1. Vertebrae: Vertebral body height maintained without acute or chronic fracture. Bone marrow signal intensity within normal limits. No discrete or worrisome osseous lesions or abnormal marrow edema. Cord: Normal signal and morphology. Posterior Fossa, vertebral arteries, paraspinal tissues: Unremarkable. Disc levels: C2-C3: Trace anterolisthesis. No significant disc bulge. Moderate to advanced left worse than right facet arthrosis. No canal or foraminal stenosis. C3-C4: Minimal disc bulge with mild bilateral uncovertebral hypertrophy. Moderate left worse than right facet arthrosis. No spinal stenosis. Mild bilateral C4 foraminal stenosis. C4-C5: Trace anterolisthesis with diffuse disc osteophyte complex. Broad posterior component flattens the ventral thecal sac. Severe left with moderate right facet arthrosis. No spinal stenosis. Moderate to severe left with mild right C5 foraminal stenosis. C5-C6: Mild degenerative disc space narrowing with diffuse disc osteophyte complex. Moderate left with mild right facet arthrosis. No spinal stenosis. Severe left with moderate right C6 foraminal narrowing. C6-C7: Anterolisthesis with mild disc bulge. Moderate left with mild right facet hypertrophy. No spinal stenosis. Foramina remain patent. C7-T1: Anterolisthesis with mild disc bulge. Moderate left with mild  right facet hypertrophy. No canal or foraminal stenosis. IMPRESSION: 1. Normal MRI appearance of the cervical spinal cord. No cord signal changes to suggest myelopathy. 2. Multilevel cervical spondylosis without significant spinal stenosis. Moderate to severe left C5 foraminal stenosis, with severe left and moderate right C6 foraminal narrowing. 3. Moderate to advanced multilevel facet hypertrophy as above, overall worse on the left. Findings could contribute to neck pain. Electronically Signed   By: Rise Mu M.D.   On: 10/03/2022 02:13   DG Chest Port 1 View  Result Date: 10/02/2022 CLINICAL DATA:  Chest pain and back pain. EXAM: PORTABLE CHEST 1 VIEW COMPARISON:  August 03, 2022 FINDINGS: The heart size and mediastinal contours are within normal limits. Mild, stable linear scarring and/or atelectasis is seen within the left lung base. There is no evidence of a pleural effusion or pneumothorax. Multilevel degenerative changes seen throughout the thoracic spine. IMPRESSION: Mild left basilar linear scarring and/or atelectasis. Electronically Signed   By: Aram Candela M.D.   On: 10/02/2022 23:46    Labs:  CBC: Recent Labs    10/02/22 2152 10/04/22 1948 10/05/22 0003 10/05/22 0934 10/06/22 0412  WBC 3.4* 4.8  --  4.2 3.6*  HGB 10.7* 11.3* 9.5* 11.1* 9.9*  HCT 34.0* 36.3 28.0* 35.3* 32.4*  PLT 202 236  --  231 244    COAGS: Recent Labs    10/05/22 1224  INR 1.1    BMP: Recent Labs    10/02/22 2152 10/04/22 1948 10/05/22 0003 10/05/22 0934 10/05/22 1359 10/06/22 0412  NA 141 139 140  --  143 139  K 3.6 3.2* 3.2*  --  3.1* 3.2*  CL 100 97*  --   --  100 104  CO2 27 26  --   --  27 26  GLUCOSE 203* 237*  --   --  78 127*  BUN 12 8  --   --  6* 6*  CALCIUM 8.8* 9.1  --   --  9.2 8.3*  CREATININE 1.18* 1.31*  --  1.06* 1.03* 1.08*  GFRNONAA 50* 44*  --  57* 59* 56*    LIVER FUNCTION TESTS: Recent Labs    07/31/22 0529 09/26/22 0859 10/03/22 0019  10/04/22 1948  BILITOT 0.4 0.4 0.5 0.8  AST 40 37 28 28  ALT 56* 60* 32 31  ALKPHOS 78 119 80 81  PROT 5.5* 6.5 5.3* 5.8*  ALBUMIN 3.4* 4.3 3.2* 3.4*    Assessment and Plan:  67 y.o. female with upper back pain  s/p T8 VP by Dr. Corliss Skains on 6/12.   Patient with persistent significant upper back pain requiring IV Dilaudid and lidocaine patch.  This is expected in a small portion of patients per Dr. Corliss Skains, hope she will show improvement tomorrow.  Encouraged patient to use a walker at all time, ne bending, no stooping, no lifting more than 10 pounds for 2 weeks.  Patient currently does not have PO pain meds except Tylenol, recommends wean off IV med and challenge with PO.   Further treatment plan per [facility] team.  Appreciate and agree with the plan.  NIR will follow.    Electronically Signed: Willette Brace, PA-C 10/06/2022, 8:45 AM   I spent a total of 25 Minutes at the the patient's bedside AND on the patient's hospital floor or unit, greater than 50% of which was counseling/coordinating care for s/p T8 VP.   This chart was dictated using voice recognition software.  Despite best efforts to proofread,  errors can occur which can change the documentation meaning.

## 2022-10-06 NOTE — Progress Notes (Signed)
SLP Cancellation Note  Patient Details Name: Meghan Alexander MRN: [id number] DOB: May 14, 1955   Cancelled treatment:       Reason Eval/Treat Not Completed: Other (comment). Discussed with MD. Pt with a long history of achalasia and esophageal dysphagia. Current CT shows fluid filled esophagus and pt with nausea and vomiting. Treatment of a chronic esophageal dysphagia is outside of SLP scope. Recommend consult with GI if esophageal function is impacting pts nutrition or safety as pt has considered repeat medical interventions in the past.    Meghan Alexander, Riley Nearing 10/06/2022, 2:39 PM

## 2022-10-06 NOTE — Progress Notes (Signed)
  Echocardiogram 2D Echocardiogram has been performed.  Milda Smart 10/06/2022, 10:17 AM

## 2022-10-06 NOTE — Evaluation (Addendum)
Occupational Therapy Evaluation Patient Details Name: Meghan Alexander MRN: [id number] DOB: 1955/10/02 Today's Date: 10/06/2022   History of Present Illness 67 yo female presents to [facility] on 6/9 with back pain x2 weeks, imaging shows subacute T7-8 compression fx, s/p T7-8 vertebroplasty on 6/12. PMH:  R ankle fx ORIF of ankle, T2DM, HTN, follicular lymphoma in remission, crohn's disease, pneumococcal PNA   Clinical Impression   Meghan Alexander was evaluated s/p the above admission list. Independent with ADL and mobility at baseline. Has not used a RW since March. Overall Pt requires min A with LB ADL and S with mobility. Began education on back precautions and use of compensatory strategies and DME/AE to maximize safety with mobility and ADL tasks.  Pt will benefit from continued acute OT services to facilitate safe DC home with intermittent S. Do not feel pt will need OT after DC.   Pt asking for pain med dosage to be increased - nsg made aware. Pt complaining of SOB during activity - SpO2 95; HR 117; discussed anxiety with movement/pain and encouraged pursed lip breathing as relaxation technique.      Recommendations for follow up therapy are one component of a multi-disciplinary discharge planning process, led by the attending physician.  Recommendations may be updated based on patient status, additional functional criteria and insurance authorization.   Assistance Recommended at Discharge Intermittent Supervision/Assistance  Patient can return home with the following Assistance with cooking/housework;Assist for transportation    Functional Status Assessment  Patient has had a recent decline in their functional status and demonstrates the ability to make significant improvements in function in a reasonable and predictable amount of time.  Equipment Recommendations  None recommended by OT    Recommendations for Other Services       Precautions / Restrictions Precautions Precautions:  Fall;Back Precaution Comments: Reviewed log roll during bed mobility, BLT rules, brace application and use. Order states brace at all times. per Meghan Silk NP via secure chat, who placed order, brace intended for when pt is up and moving Required Braces or Orthoses: Spinal Brace Spinal Brace: Thoracolumbosacral orthotic;Applied in sitting position Restrictions Weight Bearing Restrictions: No      Mobility Bed Mobility Overal bed mobility: Needs Assistance Bed Mobility: Rolling, Sidelying to Sit, Sit to Sidelying Rolling: Supervision Sidelying to sit: Supervision     Sit to sidelying: Supervision General bed mobility comments: use of bedrails, verbal cuing for log roll technique    Transfers Overall transfer level: Needs assistance Equipment used: None Transfers: Sit to/from Stand Sit to Stand: Supervision           General transfer comment: safety      Balance Overall balance assessment: Needs assistance, History of Falls Sitting-balance support: Feet supported, No upper extremity supported Sitting balance-[name]: Good     Standing balance support: No upper extremity supported Standing balance-[name]: Fair                             ADL either performed or assessed with clinical judgement   ADL Overall ADL's : Needs assistance/impaired     Grooming: Set up;Supervision/safety;Standing   Upper Body Bathing: Set up;Supervision/ safety;Sitting   Lower Body Bathing: Min guard;Sit to/from stand   Upper Body Dressing : Minimal assistance (brace)   Lower Body Dressing: Min guard;Sit to/from stand; able to complete figure four positioning   Toilet Transfer: Supervision/safety;Ambulation   Toileting- Clothing Manipulation and Hygiene: Supervision/safety; able to reach  and complete pericare       Functional mobility during ADLs: Min guard Will benefit from reacher and long handled sponge Began education on managing pets/pee pads at home  with reacher; Need to further address       Vision Baseline Vision/History: 1 Wears glasses Ability to See in Adequate Light:  (reading)       Perception     Praxis      Pertinent Vitals/Pain Pain Assessment Pain Assessment: 0-10 Pain Score: 9  Pain Location: back Pain Descriptors / Indicators: Sore, Discomfort, Grimacing Pain Intervention(s): Limited activity within patient's tolerance, Premedicated before session     Hand Dominance Right   Extremity/Trunk Assessment Upper Extremity Assessment Upper Extremity Assessment: Overall WFL for tasks assessed (tingling in hands; mostly R)   Lower Extremity Assessment Lower Extremity Assessment: Defer to PT evaluation   Cervical / Trunk Assessment Cervical / Trunk Assessment: Back Surgery (kyphoplasty)   Communication Communication Communication: No difficulties   Cognition Arousal/Alertness: Awake/alert Behavior During Therapy: Anxious Overall Cognitive Status: Impaired/Different from baseline Area of Impairment: Safety/judgement                         Safety/Judgement: Decreased awareness of deficits, Decreased awareness of safety     General Comments: decreased safety awareness, mobilizes without TLSO brace upon PT re-entry at end of session, pt states "whoops, bad girl" but does not attempt to don brace.     General Comments  discussed donning/doffing brace    Exercises     Shoulder Instructions      Home Living Family/patient expects to be discharged to:: Private residence Living Arrangements: Spouse/significant other Available Help at Discharge: Family;Available 24 hours/day Type of Home: House Home Access: Stairs to enter [employer] of Steps: 2   Home Layout: Two level;Able to live on main level with bedroom/bathroom Alternate Level Stairs-Number of Steps: chair lift   Bathroom Shower/Tub: Walk-in shower;Tub/shower unit (tub/shower downstairs)   Bathroom Toilet: Handicapped  height Bathroom Accessibility: Yes How Accessible: Accessible via walker Home Equipment: Rolling Walker (2 wheels);Shower seat;Grab bars - tub/shower;Grab bars - toilet;Rollator (4 wheels);Other (comment) (chair lift)   Additional Comments: x3 walkers      Prior Functioning/Environment Prior Level of Function : Independent/Modified Independent             Mobility Comments: Has been3 months since using a RW          OT Problem List: Decreased knowledge of use of DME or AE;Decreased knowledge of precautions;Obesity;Pain      OT Treatment/Interventions: Self-care/ADL training;DME and/or AE instruction;Therapeutic activities;Patient/family education    OT Goals(Current goals can be found in the care plan section) Acute Rehab OT Goals Patient Stated Goal: to get back to doing things OT Goal Formulation: With patient Time For Goal Achievement: 10/20/22 Potential to Achieve Goals: Good  OT Frequency: Min 2X/week    Co-evaluation              AM-PAC OT "6 Clicks" Daily Activity     Outcome Measure Help from another person eating meals?: None Help from another person taking care of personal grooming?: A Little Help from another person toileting, which includes using toliet, bedpan, or urinal?: A Little Help from another person bathing (including washing, rinsing, drying)?: A Little Help from another person to put on and taking off regular upper body clothing?: A Little Help from another person to put on and taking off regular lower body clothing?: A Little  6 Click Score: 19   End of Session Equipment Utilized During Treatment: Gait belt Nurse Communication: Mobility status  Activity Tolerance: Patient tolerated treatment well Patient left: in bed;with call bell/phone within reach  OT Visit Diagnosis: Pain;Other abnormalities of gait and mobility (R26.89) Pain - part of body:  (back)                Time: 1353-1419 OT Time Calculation (min): 26 min Charges:  OT  General Charges $OT Visit: 1 Visit OT Evaluation $OT Eval Low Complexity: 1 Low OT Treatments $Self Care/Home Management : 8-22 mins  Luisa Dago, OT/L   Acute OT Clinical Specialist Acute Rehabilitation Services Pager [phone] Office [phone]   [facility] 10/06/2022, 2:32 PM

## 2022-10-07 DIAGNOSIS — R112 Nausea with vomiting, unspecified: Secondary | ICD-10-CM | POA: Diagnosis not present

## 2022-10-07 LAB — GLUCOSE, CAPILLARY
Glucose-Capillary: 109 mg/dL — ABNORMAL HIGH (ref 70–99)
Glucose-Capillary: 115 mg/dL — ABNORMAL HIGH (ref 70–99)
Glucose-Capillary: 133 mg/dL — ABNORMAL HIGH (ref 70–99)
Glucose-Capillary: 142 mg/dL — ABNORMAL HIGH (ref 70–99)
Glucose-Capillary: 202 mg/dL — ABNORMAL HIGH (ref 70–99)
Glucose-Capillary: 233 mg/dL — ABNORMAL HIGH (ref 70–99)

## 2022-10-07 LAB — BASIC METABOLIC PANEL
Anion gap: 11 (ref 5–15)
BUN: 5 mg/dL — ABNORMAL LOW (ref 8–23)
CO2: 28 mmol/L (ref 22–32)
Calcium: 8.8 mg/dL — ABNORMAL LOW (ref 8.9–10.3)
Chloride: 102 mmol/L (ref 98–111)
Creatinine, Ser: 1.05 mg/dL — ABNORMAL HIGH (ref 0.44–1.00)
GFR, Estimated: 58 mL/min — ABNORMAL LOW (ref >60)
Glucose, Bld: 104 mg/dL — ABNORMAL HIGH (ref 70–99)
Potassium: 3.8 mmol/L (ref 3.5–5.1)
Sodium: 141 mmol/L (ref 135–145)

## 2022-10-07 LAB — CALCIUM, IONIZED: Calcium, Ionized, Serum: 5.1 mg/dL (ref 4.5–5.6)

## 2022-10-07 LAB — MAGNESIUM: Magnesium: 2 mg/dL (ref 1.7–2.4)

## 2022-10-07 LAB — SURGICAL PATHOLOGY

## 2022-10-07 MED ORDER — METOPROLOL SUCCINATE ER 25 MG PO TB24
25.0000 mg | ORAL_TABLET | Freq: Every day | ORAL | Status: DC
  Administered 2022-10-07: 25 mg via ORAL
  Filled 2022-10-07: qty 1

## 2022-10-07 MED ORDER — INSULIN ASPART 100 UNIT/ML IJ SOLN
0.0000 [IU] | Freq: Two times a day (BID) | INTRAMUSCULAR | Status: DC
  Administered 2022-10-08: 1 [IU] via SUBCUTANEOUS
  Administered 2022-10-09: 3 [IU] via SUBCUTANEOUS
  Administered 2022-10-09 – 2022-10-10 (×2): 1 [IU] via SUBCUTANEOUS
  Administered 2022-10-10 – 2022-10-11 (×2): 2 [IU] via SUBCUTANEOUS

## 2022-10-07 MED ORDER — PANTOPRAZOLE SODIUM 40 MG PO TBEC
40.0000 mg | DELAYED_RELEASE_TABLET | Freq: Two times a day (BID) | ORAL | Status: DC
  Administered 2022-10-07 – 2022-10-11 (×8): 40 mg via ORAL
  Filled 2022-10-07 (×8): qty 1

## 2022-10-07 NOTE — Progress Notes (Signed)
Referring Physician(s): Junious Silk, NP   Supervising Physician: Julieanne Cotton  Patient Status:  [facility] - In-pt  Chief Complaint:  Upper back pain, possible T7/T8 compression fx S/p T8 VP by Dr. Corliss Skains on 6/12  - No true compression fx seen in T7 body during the procedure.   Subjective:  Patient seen with Dr. Corliss Skains.  Patient is laying in bed sleeping.  States that her back pain is about the same, it has not gown below 6-7, pain occurs when she stands, pain is mild when she is laying in bed.  Walker did alleviate pain while ambulating.    Allergies: Latex, Codeine, Atenolol, Other, Hydrocodone, and Oxycodone  Medications: Prior to Admission medications   Medication Sig Start Date End Date Taking? Authorizing Provider  BENADRYL ALLERGY 25 MG tablet Take 25 mg by mouth at bedtime.   Yes [provider]  CYLTEZO-CD/UC/HS STARTER 40 MG/0.8ML AJKT Inject 160 mLs into the skin every 14 (fourteen) days. Started 09/10/2022. 09/09/22  Yes [provider]  HUMALOG KWIKPEN 100 UNIT/ML KwikPen Inject 5 Units into the skin with breakfast, with lunch, and with evening meal. Patient taking differently: Inject 15 Units into the skin See admin instructions. Inject 15 units into the skin before breakfast and an additional 15 units once a day as needed for elevated BGL 10/11/21  Yes Littie Deeds, MD  insulin detemir (LEVEMIR) 100 UNIT/ML FlexPen Inject 10 Units into the skin daily. Patient taking differently: Inject 20 Units into the skin See admin instructions. Inject 20 units into the skin in the morning as needed for elevated BGL 10/11/21  Yes Littie Deeds, MD  losartan (COZAAR) 50 MG tablet Take 25 mg by mouth in the morning.   Yes [provider]  Melatonin 10 MG TABS Take 10 mg by mouth at bedtime.   Yes [provider]  metFORMIN (GLUCOPHAGE) 500 MG tablet Take 500 mg by mouth daily with breakfast. 07/23/19  Yes [provider]   metoprolol succinate (TOPROL-XL) 25 MG 24 hr tablet Take 1 tablet (25 mg total) by mouth daily. 10/12/21  Yes Littie Deeds, MD  Multiple Vitamin (MULTIVITAMIN) tablet Take 1 tablet by mouth daily with breakfast.   Yes [provider]  omeprazole (PRILOSEC) 20 MG capsule Take 20 mg by mouth daily before breakfast. 11/28/20  Yes [provider]  ondansetron (ZOFRAN) 4 MG tablet Take 1 tablet (4 mg total) by mouth every 8 (eight) hours as needed for nausea or vomiting. 10/03/22  Yes Tilden Fossa, MD  oxyCODONE (ROXICODONE) 5 MG immediate release tablet Take 1 tablet (5 mg total) by mouth every 6 (six) hours as needed for up to 10 doses for severe pain. 09/26/22  Yes Mardene Sayer, MD  rosuvastatin (CRESTOR) 10 MG tablet Take 10 mg by mouth daily.   Yes [provider]  saccharomyces boulardii (FLORASTOR) 250 MG capsule Take 250 mg by mouth in the morning.   Yes [provider]  TURMERIC PO Take 1 capsule by mouth at bedtime.   Yes [provider]  venlafaxine XR (EFFEXOR-XR) 37.5 MG 24 hr capsule Take 37.5 mg by mouth daily with breakfast.   Yes [provider]  Accu-Chek Softclix Lancets lancets Use to test blood sugars up to 4 times daily as directed. 08/09/21   Drema Dallas, MD  Blood Glucose Monitoring Suppl (BLOOD GLUCOSE MONITOR SYSTEM) w/Device KIT Use to test blood sugars up to 4 times daily. 08/09/21   Drema Dallas, MD  Continuous Blood Gluc Sensor (FREESTYLE LIBRE 2 SENSOR) MISC Inject 1 Device into the skin every 14 (fourteen) days.    [provider]  diltiazem (CARDIZEM CD) 240 MG 24 hr capsule Take 1 capsule (240 mg total) by mouth daily. Patient not taking: Reported on 10/05/2022 10/11/21   Littie Deeds, MD  glucose blood (ACCU-CHEK GUIDE) test strip Use to test blood sugars up to 4 times daily as directed. 08/09/21   Drema Dallas, MD  Insulin Pen Needle 32G X 4 MM MISC Use to inject insulin up to 4 times daily as  directed. 08/09/21   Drema Dallas, MD  lidocaine (LIDODERM) 5 % Place 1 patch onto the skin daily. Remove & Discard patch within 12 hours or as directed by MD Patient not taking: Reported on 10/05/2022 09/26/22   Mardene Sayer, MD  predniSONE (DELTASONE) 10 MG tablet Take 20 mg by mouth daily. Patient not taking: Reported on 10/05/2022 09/07/22   [provider]     Vital Signs: BP (!) 149/97 (BP Location: Right Arm)   Pulse (!) 108   Temp 98 F (36.7 C) (Oral)   Resp 16   Ht 5\' 2"  (1.575 m)   Wt 176 lb 5.9 oz (80 kg)   SpO2 97%   BMI 32.26 kg/m   Physical Exam Vitals reviewed.  Constitutional:      General: She is not in acute distress.    Appearance: She is not ill-appearing.  Pulmonary:     Effort: Pulmonary effort is normal.  Neurological:     Mental Status: She is alert and oriented to person, place, and time.  Psychiatric:        Mood and Affect: Mood normal.        Behavior: Behavior normal.     Imaging: VAS [place] LOWER EXTREMITY VENOUS (DVT)  Result Date: 10/06/2022  Lower Venous DVT Study Patient Name:  DANEY REVOIR  Date of Exam:   10/06/2022 Medical Rec #: [id number]       Accession #:    [id number] Date of Birth: 03/15/1956        Patient Gender: F Patient Age:   67 years Exam Location:  [facility] Procedure:      VAS [place] LOWER EXTREMITY VENOUS (DVT) Referring Phys: Junious Silk --------------------------------------------------------------------------------  Indications: Edema.  Risk Factors: Cancer Lymphoma Chrohn's disease,. Comparison Study: Previous exam on 09/08/21 was negative for DVT Performing Technologist: Ernestene Mention RVT, RDMS  Examination Guidelines: A complete evaluation includes B-mode imaging, spectral Doppler, color Doppler, and power Doppler as needed of all accessible portions of each vessel. Bilateral testing is considered an integral part of a complete examination. Limited examinations for reoccurring indications may be performed as  noted. The reflux portion of the exam is performed with the patient in reverse Trendelenburg.  +---------+---------------+---------+-----------+----------+-------------------+ RIGHT    CompressibilityPhasicitySpontaneityPropertiesThrombus Aging      +---------+---------------+---------+-----------+----------+-------------------+ CFV      Full           Yes      Yes                                      +---------+---------------+---------+-----------+----------+-------------------+ SFJ      Full                                                             +---------+---------------+---------+-----------+----------+-------------------+  FV Prox  Full           Yes      Yes                                      +---------+---------------+---------+-----------+----------+-------------------+ FV Mid   Full           Yes      Yes                                      +---------+---------------+---------+-----------+----------+-------------------+ FV DistalFull           Yes      Yes                                      +---------+---------------+---------+-----------+----------+-------------------+ PFV      Full                                                             +---------+---------------+---------+-----------+----------+-------------------+ POP      Full           Yes      Yes                                      +---------+---------------+---------+-----------+----------+-------------------+ PTV      Full                                                             +---------+---------------+---------+-----------+----------+-------------------+ PERO     Full                                         Not well visualized +---------+---------------+---------+-----------+----------+-------------------+   +---------+---------------+---------+-----------+----------+-------------------+ LEFT     CompressibilityPhasicitySpontaneityPropertiesThrombus  Aging      +---------+---------------+---------+-----------+----------+-------------------+ CFV      Full           Yes      Yes                                      +---------+---------------+---------+-----------+----------+-------------------+ SFJ      Full                                                             +---------+---------------+---------+-----------+----------+-------------------+ FV Prox  Full           Yes      Yes                                      +---------+---------------+---------+-----------+----------+-------------------+  FV Mid   Full           Yes      Yes                                      +---------+---------------+---------+-----------+----------+-------------------+ FV DistalFull           Yes      Yes                                      +---------+---------------+---------+-----------+----------+-------------------+ PFV      Full                                                             +---------+---------------+---------+-----------+----------+-------------------+ POP      Full           Yes      Yes                                      +---------+---------------+---------+-----------+----------+-------------------+ PTV      Full                                                             +---------+---------------+---------+-----------+----------+-------------------+ PERO     Full                                         Not well visualized +---------+---------------+---------+-----------+----------+-------------------+     Summary: BILATERAL: - No evidence of deep vein thrombosis seen in the lower extremities, bilaterally. - RIGHT: - A large cystic structure with internal calcifications is found in the popliteal fossa (6.8 x 1.0 x 4.8 cm).  LEFT: - No cystic structure found in the popliteal fossa.  *See table(s) above for measurements and observations. Electronically signed by Sherald Hess MD on 10/06/2022  at 1:24:52 PM.    Final    ECHOCARDIOGRAM COMPLETE  Result Date: 10/06/2022    ECHOCARDIOGRAM REPORT   Patient Name:   JIZEL Bakke Date of Exam: 10/06/2022 Medical Rec #:  [id number]      Height:       62.0 in Accession #:    [id number]     Weight:       176.4 lb Date of Birth:  1955/12/03       BSA:          1.812 m Patient Age:    67 years       BP:           162/96 mmHg Patient Gender: F              HR:           101 bpm. Exam Location:  Inpatient Procedure: 2D Echo, Cardiac Doppler, Color Doppler and Intracardiac  Opacification Agent Indications:    Edema  History:        Patient has prior history of Echocardiogram examinations, most                 recent 10/08/2021. Arrythmias:Atrial Fibrillation; Risk                 Factors:Diabetes and Hypertension. ILD, lymphoma.  Sonographer:    Milda Smart Referring Phys: [id number] ALLISON L ELLIS  Sonographer Comments: Image acquisition challenging due to patient body habitus and Image acquisition challenging due to respiratory motion. IMPRESSIONS  1. Left ventricular ejection fraction, by estimation, is 55 to 60%. The left ventricle has normal function. The left ventricle has no regional wall motion abnormalities. Indeterminate diastolic filling due to E-A fusion.  2. Right ventricular systolic function is normal. The right ventricular size is normal.  3. The mitral valve is normal in structure. Mild mitral valve regurgitation. No evidence of mitral stenosis.  4. The aortic valve is normal in structure. Aortic valve regurgitation is not visualized. No aortic stenosis is present.  5. The inferior vena cava is normal in size with greater than 50% respiratory variability, suggesting right atrial pressure of 3 mmHg. FINDINGS  Left Ventricle: Left ventricular ejection fraction, by estimation, is 55 to 60%. The left ventricle has normal function. The left ventricle has no regional wall motion abnormalities. The left ventricular internal cavity size was normal in  size. There is  no left ventricular hypertrophy. Indeterminate diastolic filling due to E-A fusion. Right Ventricle: The right ventricular size is normal. No increase in right ventricular wall thickness. Right ventricular systolic function is normal. Left Atrium: Left atrial size was normal in size. Right Atrium: Right atrial size was normal in size. Pericardium: There is no evidence of pericardial effusion. Mitral Valve: The mitral valve is normal in structure. There is mild calcification of the mitral valve leaflet(s). Mild mitral valve regurgitation. No evidence of mitral valve stenosis. Tricuspid Valve: The tricuspid valve is normal in structure. Tricuspid valve regurgitation is not demonstrated. No evidence of tricuspid stenosis. Aortic Valve: The aortic valve is normal in structure. Aortic valve regurgitation is not visualized. No aortic stenosis is present. Pulmonic Valve: The pulmonic valve was not well visualized. Pulmonic valve regurgitation is not visualized. No evidence of pulmonic stenosis. Aorta: The aortic root is normal in size and structure. Venous: The inferior vena cava is normal in size with greater than 50% respiratory variability, suggesting right atrial pressure of 3 mmHg. IAS/Shunts: No atrial level shunt detected by color flow Doppler.  LEFT VENTRICLE PLAX 2D LVIDd:         4.10 cm     Diastology LVIDs:         3.20 cm     LV e' medial:    5.11 cm/s LV PW:         0.90 cm     LV E/e' medial:  22.1 LV IVS:        0.70 cm     LV e' lateral:   7.29 cm/s LVOT diam:     2.30 cm     LV E/e' lateral: 15.5 LV SV:         70 LV SV Index:   39 LVOT Area:     4.15 cm  LV Volumes (MOD) LV vol d, MOD A2C: 86.1 ml LV vol d, MOD A4C: 84.8 ml LV vol s, MOD A2C: 39.0 ml LV vol s, MOD A4C: 38.6 ml LV  SV MOD A2C:     47.1 ml LV SV MOD A4C:     84.8 ml LV SV MOD BP:      46.5 ml RIGHT VENTRICLE RV S prime:     9.25 cm/s TAPSE (M-mode): 1.3 cm LEFT ATRIUM             Index        RIGHT ATRIUM          Index LA  diam:        3.80 cm 2.10 cm/m   RA Area:     7.52 cm LA Vol (A2C):   27.8 ml 15.34 ml/m  RA Volume:   12.90 ml 7.12 ml/m LA Vol (A4C):   29.3 ml 16.17 ml/m LA Biplane Vol: 30.4 ml 16.78 ml/m  AORTIC VALVE LVOT Vmax:   89.40 cm/s LVOT Vmean:  71.800 cm/s LVOT VTI:    0.168 m  AORTA Ao Root diam: 3.00 cm Ao Asc diam:  3.70 cm MITRAL VALVE MV Area (PHT): 4.80 cm     SHUNTS MV Decel Time: 158 msec     Systemic VTI:  0.17 m MR Peak grad: 89.1 mmHg     Systemic Diam: 2.30 cm MR Vmax:      472.00 cm/s MV E velocity: 113.00 cm/s Arvilla Meres MD Electronically signed by Arvilla Meres MD Signature Date/Time: 10/06/2022/11:19:14 AM    Final    CT Angio Chest PE W/Cm &/Or Wo Cm  Result Date: 10/05/2022 CLINICAL DATA:  Pulmonary embolism (PE) suspected, high prob. Back pain. EXAM: CT ANGIOGRAPHY CHEST WITH CONTRAST TECHNIQUE: Multidetector CT imaging of the chest was performed using the standard protocol during bolus administration of intravenous contrast. Multiplanar CT image reconstructions and MIPs were obtained to evaluate the vascular anatomy. RADIATION DOSE REDUCTION: This exam was performed according to the departmental dose-optimization program which includes automated exposure control, adjustment of the mA and/or kV according to patient size and/or use of iterative reconstruction technique. CONTRAST:  75mL OMNIPAQUE IOHEXOL 350 MG/ML SOLN COMPARISON:  MRI 10/03/2022 FINDINGS: Cardiovascular: No filling defects in the pulmonary arteries to suggest pulmonary emboli. Heart is normal size. Aorta is normal caliber. Coronary artery and aortic calcifications. Mediastinum/Nodes: No mediastinal, hilar, or axillary adenopathy. Esophagus is patulous, dilated and fluid-filled. This is unchanged since prior study. Trachea and thyroid unremarkable. Lungs/Pleura: Medial left apical nodule again noted measuring approximately 9 mm compared to 10 mm previously. Linear densities in the lingula are stable, likely  scarring. No acute confluent opacities or effusions. 7 mm inferior lingular nodule on image 80 is unchanged since prior study. Upper Abdomen: Calcifications in the spleen and liver compatible with old granulomatous disease. No acute findings. Musculoskeletal: Chest wall soft tissues are unremarkable. Bilateral breast implants grossly unremarkable. Moderate to severe compression fracture noted at T8 as seen on prior MRI, unchanged. Stable mild compression fracture through the superior endplate of T12. Review of the MIP images confirms the above findings. IMPRESSION: No evidence of pulmonary embolus. Stable left upper lobe nodules measuring up to 9 mm. Consider follow-up imaging in 6-12 months to assess stability. Patulous, fluid-filled esophagus is unchanged. Coronary artery disease. No acute cardiopulmonary disease. Aortic Atherosclerosis (ICD10-I70.0). Electronically Signed   By: Charlett Nose M.D.   On: 10/05/2022 00:09   DG Chest Port 1 View  Result Date: 10/04/2022 CLINICAL DATA:  Shortness of breath. EXAM: PORTABLE CHEST 1 VIEW COMPARISON:  10/02/2022, CT 09/26/2022 FINDINGS: Improved lung aeration. Stable heart size and mediastinal contours. Minor atelectasis at  the left lung base, no confluent consolidation. No pleural fluid or pneumothorax. No pulmonary edema. Left apical nodule on CT not seen by radiograph IMPRESSION: Minor left basilar atelectasis. Electronically Signed   By: Narda Rutherford M.D.   On: 10/04/2022 22:50    Labs:  CBC: Recent Labs    10/02/22 2152 10/04/22 1948 10/05/22 0003 10/05/22 0934 10/06/22 0412  WBC 3.4* 4.8  --  4.2 3.6*  HGB 10.7* 11.3* 9.5* 11.1* 9.9*  HCT 34.0* 36.3 28.0* 35.3* 32.4*  PLT 202 236  --  231 244     COAGS: Recent Labs    10/05/22 1224  INR 1.1     BMP: Recent Labs    10/04/22 1948 10/05/22 0003 10/05/22 0934 10/05/22 1359 10/06/22 0412 10/07/22 0503  NA 139 140  --  143 139 141  K 3.2* 3.2*  --  3.1* 3.2* 3.8  CL 97*  --    --  100 104 102  CO2 26  --   --  27 26 28   GLUCOSE 237*  --   --  78 127* 104*  BUN 8  --   --  6* 6* 5*  CALCIUM 9.1  --   --  9.2 8.3* 8.8*  CREATININE 1.31*  --  1.06* 1.03* 1.08* 1.05*  GFRNONAA 44*  --  57* 59* 56* 58*     LIVER FUNCTION TESTS: Recent Labs    07/31/22 0529 09/26/22 0859 10/03/22 0019 10/04/22 1948  BILITOT 0.4 0.4 0.5 0.8  AST 40 37 28 28  ALT 56* 60* 32 31  ALKPHOS 78 119 80 81  PROT 5.5* 6.5 5.3* 5.8*  ALBUMIN 3.4* 4.3 3.2* 3.4*     Assessment and Plan:  67 y.o. female with upper back pain  s/p T8 VP by Dr. Corliss Skains on 6/12.   Still in pain, pain is about the same but has not gotten worse.  Patient is still requiring IV Dilaudid 0.5 mg PRN 2hr, PO oxy 5 mg PRN 4hr, and lidocaine patch  Worked with PT/OT yesterday, patient needs assistance with bed mobility and transfers, patient was able to walk 250 feet w/o assistant or assistive device.   Hopefully she can wean off IV Dilaudid over the weekend.  If the upper back pain suddenly becomes significantly worse, recommend MR thoracic spine w/o for further evaluation.  Recommend walk with a walker at all time to prevent fall/aid with the upper back pain.   Further treatment plan per [facility] team.  Appreciate and agree with the plan.  NIR will follow.    Electronically Signed: Willette Brace, PA-C 10/07/2022, 8:42 AM   I spent a total of 15 Minutes at the the patient's bedside AND on the patient's hospital floor or unit, greater than 50% of which was counseling/coordinating care for s/p T8 VP.   This chart was dictated using voice recognition software.  Despite best efforts to proofread,  errors can occur which can change the documentation meaning.

## 2022-10-07 NOTE — Progress Notes (Signed)
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  Workup in the emergency department showed subacute T8 compression fracture.  Lab also showed AKI, hypokalemia.  Also was complaining of nausea and vomiting.  IR was consulted for compression fracture, underwent vertebroplasty on 6/12.  Consulted PT/OT.  Continued back pain prolonging hospital course  Assessment & Plan:  Principal Problem:   Intractable nausea and vomiting  Intractable nausea/vomiting: Nausea and vomiting have been better.  Initially suspected to be from pain medications.  Also has history of achalasia.  She denies any problem with swallowing. CT imaging showed  patulous, fluid-filled esophagus.  Speech therapy signed off ,patient has history of achalasia and follows with [facility], Dr. Dulce Sellar.  She has not appointment coming soon.  AKI: Baseline creatinine normal.  Presented with creatinine in the range of 1.3.  Resolved with IV fluids  Hypokalemia/hypomagnesemia: Currently being supplemented and monitored.  Known T8 compression fracture: No focal neurological deficits.  Patient was taking oral Dilaudid at home.  Minimize narcotics as much as possible.  Continue lidocaine patch, Robaxin.  PT/OT consulted.  IR consulted and underwent vertebroplasty on 6/12.  Apply TLSO brace when out of bed.  Bilateral lower extremity edema: Ongoing for months.  Echo shows EF of 55 to 60%, indeterminate diastolic filling.  Given her dose of Lasix 40 mg once.  Venous Doppler pending showed cystic structure on the right popliteal fossa.  No deep vein thrombosis  Diabetes type 2: On metformin at home.  Currently on  sliding scale.  Monitor blood sugars  Hypertension: Hypertensive on presentation.  Monitor blood pressure, continue current medications  Crohns disease: Recently diagnosed.  Patient was started on Cyltezo-CD recently.  No history of hematochezia  History of follicular lymphoma: Follows with Dr. Candise Che as an outpatient.  On chronic steroid therapy.  Lung nodule: CT imaging showed stable left upper lobe nodules measuring up to 9 mm.we recommend follow-up imaging in 6-12 months to assess stability.       DVT prophylaxis:enoxaparin (LOVENOX) injection 40 mg Start: 10/05/22 1000     Code Status: Full Code  Family Communication: None at bedside  Patient status:Inpatient  Patient is from :Home  Anticipated discharge WJ:XBJY  Estimated DC date:1-2 days,when back pain is better   Consultants: IR  Procedures:None  Antimicrobials:  Anti-infectives (From admission, onward)    Start     Dose/Rate Route Frequency Ordered Stop   10/05/22 1452  ceFAZolin (ANCEF) IVPB 2g/100 mL premix        over 30 Minutes Intravenous Continuous PRN 10/05/22 1518 10/05/22 1452   10/05/22 0045  cefTRIAXone (ROCEPHIN) 1 g in sodium chloride 0.9 % 100 mL IVPB        1 g 200 mL/hr over 30 Minutes Intravenous  Once 10/05/22 0044 10/05/22 0225       Subjective: Patient seen and examined at bedside today.  Continues to complain of back pain today.  She looks more comfortable than yesterday.  Does not feel ready to go home yet.  Denies any vomiting this morning  Objective: Vitals:   10/07/22 0626 10/07/22 0756 10/07/22 0800 10/07/22 0909  BP: (!) 142/79 (!) 149/97  (!) 150/95  Pulse:  (!) 112 (!) 108 (!) 104  Resp: 16     Temp: 97.8 F (36.6 C) 98 F (36.7 C)    TempSrc: Oral Oral    SpO2: 97% 97%    Weight:      Height:       No intake or output data in the 24 hours ending 10/07/22 1237  Filed Weights   10/04/22 2204  Weight: 80 kg    Examination:  General exam: Overall comfortable,  not in distress,obese HEENT: PERRL Respiratory system:  no wheezes or crackles  Cardiovascular system: S1 & S2 heard, RRR.  Gastrointestinal system: Abdomen is nondistended, soft and nontender. Central nervous system: Alert and oriented Extremities:trace bilateral lower extremity edema, no clubbing ,no cyanosis Skin: No rashes, no ulcers,no icterus     Data Reviewed: I have personally reviewed following labs and imaging studies  CBC: Recent Labs  Lab 10/02/22 2152 10/04/22 1948 10/05/22 0003 10/05/22 0934 10/06/22 0412  WBC 3.4* 4.8  --  4.2 3.6*  NEUTROABS  --  2.8  --   --   --   HGB 10.7* 11.3* 9.5* 11.1* 9.9*  HCT 34.0* 36.3 28.0* 35.3* 32.4*  MCV 85.9 84.6  --  86.7 85.5  PLT 202 236  --  231 244   Basic Metabolic Panel: Recent Labs  Lab 10/02/22 2152 10/04/22 1948 10/05/22 0003 10/05/22 0934 10/05/22 1359 10/06/22 0412 10/07/22 0503  NA 141 139 140  --  143 139 141  K 3.6 3.2* 3.2*  --  3.1* 3.2* 3.8  CL 100 97*  --   --  100 104 102  CO2 27 26  --   --  27 26 28   GLUCOSE 203* 237*  --   --  78 127* 104*  BUN 12 8  --   --  6* 6* 5*  CREATININE 1.18* 1.31*  --  1.06* 1.03* 1.08* 1.05*  CALCIUM 8.8* 9.1  --   --  9.2 8.3* 8.8*  MG  --  1.3*  --  1.8 1.8 1.6* 2.0  PHOS  --   --   --  4.1 3.5  --   --      Recent Results (from the past 240 hour(s))  SARS Coronavirus 2 by RT PCR (hospital order, performed in [facility] hospital lab) *cepheid single result test* Anterior Nasal Swab     Status: None   Collection Time: 10/04/22 10:21 PM   Specimen: Anterior Nasal Swab  Result Value Ref Range Status   SARS Coronavirus 2 by RT PCR NEGATIVE NEGATIVE Final    Comment: Performed at [facility], [address], [place], [place] [address]     Radiology Studies: IR VERTEBROPLASTY CERV/THOR BX INC UNI/BIL INC/INJECT/IMAGING  Result Date: 10/07/2022 INDICATION: Severe low back pain secondary to compression fracture at T8. EXAM: VERTEBROPLASTY AT T8  MEDICATIONS: As antibiotic prophylaxis, Ancef 2 g IV was ordered pre-procedure and administered intravenously within 1 hour of incision. All current medications are in the EMR and have been reviewed as part of this encounter. ANESTHESIA/SEDATION: Moderate (conscious) sedation was employed during this procedure. A total of Versed 2 mg and Fentanyl 100 mcg was administered intravenously by the radiology nurse. Total intra-service moderate Sedation Time: 58 minutes. The patient's level of consciousness and vital signs were monitored continuously by radiology nursing throughout the procedure under my direct supervision. FLUOROSCOPY: Radiation Exposure Index (as provided by the fluoroscopic device): 901 mGy Kerma COMPLICATIONS: None immediate. TECHNIQUE: Informed written consent was obtained from the patient after  a thorough discussion of the procedural risks, benefits and alternatives. All questions were addressed. Maximal Sterile Barrier Technique was utilized including caps, mask, sterile gowns, sterile gloves, sterile drape, hand hygiene and skin antiseptic. A timeout was performed prior to the initiation of the procedure. PROCEDURE: The patient was placed prone on the fluoroscopic table. Nasal oxygen was administered. Physiologic monitoring was performed throughout the duration of the procedure. The skin overlying the thoracolumbar region was prepped and draped in the usual sterile fashion. The T8 vertebral body was identified and the left pedicle was infiltrated with 0.25% Bupivacaine. This was then followed by the advancement of a 13-gauge Cook needle through the left pedicle into the posterior one-third at T8. Two passes were made with a 16 gauge core biopsy needle. Samples were sent off for pathologic analysis as per the request of the patient's physician. The 13 gauge Cook spinal needle was then advanced into the anterior 1/3. A gentle contrast injection demonstrated a trabecular pattern of contrast with  opacification of a paraspinal vein. This necessitated the use of Gelfoam pledgets into the 13 gauge Cook spinal needle to avoid embolization of the paraspinous vein. At this time, methylmethacrylate mixture was reconstituted. Under biplane intermittent fluoroscopy, the methylmethacrylate was then injected into the T8 vertebral body with filling of the vertebral body along the inferior and also the fractured superior endplate. No extravasation was noted into the disk spaces or posteriorly into the spinal canal. No epidural venous contamination was seen. The needle was then removed. Hemostasis was achieved at the skin entry site. There were no acute complications. Patient tolerated the procedure well. The patient was returned to the floor in stable condition. IMPRESSION: 1. Status post vertebral body augmentation for painful compression fracture at T8 using vertebroplasty technique. 2.  Core biopsies were obtained and sent for pathologic analysis. If the patient has known osteoporosis, recommend treatment as clinically indicated. If the patient's bone density status is unknown, DEXA scan is recommended. Electronically Signed   By: Julieanne Cotton M.D.   On: 10/07/2022 08:45   VAS [place] LOWER EXTREMITY VENOUS (DVT)  Result Date: 10/06/2022  Lower Venous DVT Study Patient Name:  Meghan Alexander  Date of Exam:   10/06/2022 Medical Rec #: [id number]       Accession #:    [id number] Date of Birth: Sep 04, 1955        Patient Gender: F Patient Age:   65 years Exam Location:  [facility] Procedure:      VAS [place] LOWER EXTREMITY VENOUS (DVT) Referring Phys: Junious Silk --------------------------------------------------------------------------------  Indications: Edema.  Risk Factors: Cancer Lymphoma Chrohn's disease,. Comparison Study: Previous exam on 09/08/21 was negative for DVT Performing Technologist: Ernestene Mention RVT, RDMS  Examination Guidelines: A complete evaluation includes B-mode imaging, spectral Doppler, color  Doppler, and power Doppler as needed of all accessible portions of each vessel. Bilateral testing is considered an integral part of a complete examination. Limited examinations for reoccurring indications may be performed as noted. The reflux portion of the exam is performed with the patient in reverse Trendelenburg.  +---------+---------------+---------+-----------+----------+-------------------+ RIGHT    CompressibilityPhasicitySpontaneityPropertiesThrombus Aging      +---------+---------------+---------+-----------+----------+-------------------+ CFV      Full           Yes      Yes                                      +---------+---------------+---------+-----------+----------+-------------------+  SFJ      Full                                                             +---------+---------------+---------+-----------+----------+-------------------+ FV Prox  Full           Yes      Yes                                      +---------+---------------+---------+-----------+----------+-------------------+ FV Mid   Full           Yes      Yes                                      +---------+---------------+---------+-----------+----------+-------------------+ FV DistalFull           Yes      Yes                                      +---------+---------------+---------+-----------+----------+-------------------+ PFV      Full                                                             +---------+---------------+---------+-----------+----------+-------------------+ POP      Full           Yes      Yes                                      +---------+---------------+---------+-----------+----------+-------------------+ PTV      Full                                                             +---------+---------------+---------+-----------+----------+-------------------+ PERO     Full                                         Not well visualized  +---------+---------------+---------+-----------+----------+-------------------+   +---------+---------------+---------+-----------+----------+-------------------+ LEFT     CompressibilityPhasicitySpontaneityPropertiesThrombus Aging      +---------+---------------+---------+-----------+----------+-------------------+ CFV      Full           Yes      Yes                                      +---------+---------------+---------+-----------+----------+-------------------+ SFJ      Full                                                             +---------+---------------+---------+-----------+----------+-------------------+  FV Prox  Full           Yes      Yes                                      +---------+---------------+---------+-----------+----------+-------------------+ FV Mid   Full           Yes      Yes                                      +---------+---------------+---------+-----------+----------+-------------------+ FV DistalFull           Yes      Yes                                      +---------+---------------+---------+-----------+----------+-------------------+ PFV      Full                                                             +---------+---------------+---------+-----------+----------+-------------------+ POP      Full           Yes      Yes                                      +---------+---------------+---------+-----------+----------+-------------------+ PTV      Full                                                             +---------+---------------+---------+-----------+----------+-------------------+ PERO     Full                                         Not well visualized +---------+---------------+---------+-----------+----------+-------------------+     Summary: BILATERAL: - No evidence of deep vein thrombosis seen in the lower extremities, bilaterally. - RIGHT: - A large cystic structure with internal  calcifications is found in the popliteal fossa (6.8 x 1.0 x 4.8 cm).  LEFT: - No cystic structure found in the popliteal fossa.  *See table(s) above for measurements and observations. Electronically signed by Sherald Hess MD on 10/06/2022 at 1:24:52 PM.    Final    ECHOCARDIOGRAM COMPLETE  Result Date: 10/06/2022    ECHOCARDIOGRAM REPORT   Patient Name:   KENOSHA Ober Date of Exam: 10/06/2022 Medical Rec #:  [id number]      Height:       62.0 in Accession #:    [id number]     Weight:       176.4 lb Date of Birth:  Dec 29, 1955       BSA:          1.812 m Patient Age:    29 years  BP:           162/96 mmHg Patient Gender: F              HR:           101 bpm. Exam Location:  Inpatient Procedure: 2D Echo, Cardiac Doppler, Color Doppler and Intracardiac            Opacification Agent Indications:    Edema  History:        Patient has prior history of Echocardiogram examinations, most                 recent 10/08/2021. Arrythmias:Atrial Fibrillation; Risk                 Factors:Diabetes and Hypertension. ILD, lymphoma.  Sonographer:    Milda Smart Referring Phys: [id number] ALLISON L ELLIS  Sonographer Comments: Image acquisition challenging due to patient body habitus and Image acquisition challenging due to respiratory motion. IMPRESSIONS  1. Left ventricular ejection fraction, by estimation, is 55 to 60%. The left ventricle has normal function. The left ventricle has no regional wall motion abnormalities. Indeterminate diastolic filling due to E-A fusion.  2. Right ventricular systolic function is normal. The right ventricular size is normal.  3. The mitral valve is normal in structure. Mild mitral valve regurgitation. No evidence of mitral stenosis.  4. The aortic valve is normal in structure. Aortic valve regurgitation is not visualized. No aortic stenosis is present.  5. The inferior vena cava is normal in size with greater than 50% respiratory variability, suggesting right atrial pressure of 3 mmHg.  FINDINGS  Left Ventricle: Left ventricular ejection fraction, by estimation, is 55 to 60%. The left ventricle has normal function. The left ventricle has no regional wall motion abnormalities. The left ventricular internal cavity size was normal in size. There is  no left ventricular hypertrophy. Indeterminate diastolic filling due to E-A fusion. Right Ventricle: The right ventricular size is normal. No increase in right ventricular wall thickness. Right ventricular systolic function is normal. Left Atrium: Left atrial size was normal in size. Right Atrium: Right atrial size was normal in size. Pericardium: There is no evidence of pericardial effusion. Mitral Valve: The mitral valve is normal in structure. There is mild calcification of the mitral valve leaflet(s). Mild mitral valve regurgitation. No evidence of mitral valve stenosis. Tricuspid Valve: The tricuspid valve is normal in structure. Tricuspid valve regurgitation is not demonstrated. No evidence of tricuspid stenosis. Aortic Valve: The aortic valve is normal in structure. Aortic valve regurgitation is not visualized. No aortic stenosis is present. Pulmonic Valve: The pulmonic valve was not well visualized. Pulmonic valve regurgitation is not visualized. No evidence of pulmonic stenosis. Aorta: The aortic root is normal in size and structure. Venous: The inferior vena cava is normal in size with greater than 50% respiratory variability, suggesting right atrial pressure of 3 mmHg. IAS/Shunts: No atrial level shunt detected by color flow Doppler.  LEFT VENTRICLE PLAX 2D LVIDd:         4.10 cm     Diastology LVIDs:         3.20 cm     LV e' medial:    5.11 cm/s LV PW:         0.90 cm     LV E/e' medial:  22.1 LV IVS:        0.70 cm     LV e' lateral:   7.29 cm/s LVOT diam:     2.30 cm  LV E/e' lateral: 15.5 LV SV:         70 LV SV Index:   39 LVOT Area:     4.15 cm  LV Volumes (MOD) LV vol d, MOD A2C: 86.1 ml LV vol d, MOD A4C: 84.8 ml LV vol s, MOD A2C:  39.0 ml LV vol s, MOD A4C: 38.6 ml LV SV MOD A2C:     47.1 ml LV SV MOD A4C:     84.8 ml LV SV MOD BP:      46.5 ml RIGHT VENTRICLE RV S prime:     9.25 cm/s TAPSE (M-mode): 1.3 cm LEFT ATRIUM             Index        RIGHT ATRIUM          Index LA diam:        3.80 cm 2.10 cm/m   RA Area:     7.52 cm LA Vol (A2C):   27.8 ml 15.34 ml/m  RA Volume:   12.90 ml 7.12 ml/m LA Vol (A4C):   29.3 ml 16.17 ml/m LA Biplane Vol: 30.4 ml 16.78 ml/m  AORTIC VALVE LVOT Vmax:   89.40 cm/s LVOT Vmean:  71.800 cm/s LVOT VTI:    0.168 m  AORTA Ao Root diam: 3.00 cm Ao Asc diam:  3.70 cm MITRAL VALVE MV Area (PHT): 4.80 cm     SHUNTS MV Decel Time: 158 msec     Systemic VTI:  0.17 m MR Peak grad: 89.1 mmHg     Systemic Diam: 2.30 cm MR Vmax:      472.00 cm/s MV E velocity: 113.00 cm/s Arvilla Meres MD Electronically signed by Arvilla Meres MD Signature Date/Time: 10/06/2022/11:19:14 AM    Final     Scheduled Meds:  enoxaparin (LOVENOX) injection  40 mg Subcutaneous Q24H   insulin aspart  0-9 Units Subcutaneous Q4H   ketorolac  15 mg Intravenous Once   lidocaine  1 patch Transdermal Q24H   metoprolol succinate  25 mg Oral Daily   pantoprazole  40 mg Oral BID   sodium chloride flush  3 mL Intravenous Q12H   Continuous Infusions:  methocarbamol (ROBAXIN) IV       LOS: 2 days   Burnadette Pop, MD [facility] P6/14/2024, 12:37 PM

## 2022-10-07 NOTE — Progress Notes (Signed)
Occupational Therapy Treatment Patient Details Name: Meghan Alexander MRN: [id number] DOB: [birth year] Today's Date: 10/07/2022   History of present illness 67 yo female presents to [facility] on 6/9 with back pain x2 weeks, imaging shows subacute T7-8 compression fx, s/p T7-8 vertebroplasty on 6/12. PMH:  R ankle fx ORIF of ankle, T2DM, HTN, follicular lymphoma in remission, crohn's disease, pneumococcal PNA   OT comments  Patient seen to address education on back brace wear. Patient and husband educated on how to donn with patient requiring min assist and to place straps under arms. Education provided on adaptive equipment use for LB dressing with reacher, sock aide, and dressing stick. Patient able to return demonstration with min assist with reacher and donning pants. Discharge recommendations continue to be appropriate. Acute OT to continue to follow.    Recommendations for follow up therapy are one component of a multi-disciplinary discharge planning process, led by the attending physician.  Recommendations may be updated based on patient status, additional functional criteria and insurance authorization.    Assistance Recommended at Discharge Intermittent Supervision/Assistance  Patient can return home with the following  Assistance with cooking/housework;Assist for transportation   Equipment Recommendations  None recommended by OT    Recommendations for Other Services      Precautions / Restrictions Precautions Precautions: Fall;Back Precaution Comments: reviewed back precautions and how to donn brace Required Braces or Orthoses: Spinal Brace Spinal Brace: Thoracolumbosacral orthotic;Applied in sitting position Restrictions Weight Bearing Restrictions: No       Mobility Bed Mobility Overal bed mobility: Needs Assistance Bed Mobility: Rolling, Sidelying to Sit, Sit to Sidelying Rolling: Supervision Sidelying to sit: Supervision     Sit to sidelying: Supervision General bed  mobility comments: cues for log rolling    Transfers Overall transfer level: Needs assistance Equipment used: Rollator (4 wheels) Transfers: Sit to/from Stand, Bed to chair/wheelchair/BSC Sit to Stand: Supervision           General transfer comment: supervision for safety for transer to toilet and EOB     Balance Overall balance assessment: Needs assistance, History of Falls Sitting-balance support: Feet supported, No upper extremity supported Sitting balance-[name]: Good     Standing balance support: No upper extremity supported Standing balance-[name]: Fair                             ADL either performed or assessed with clinical judgement   ADL Overall ADL's : Needs assistance/impaired     Grooming: Set up;Supervision/safety;Standing               Lower Body Dressing: Minimal assistance;With adaptive equipment;Cueing for safety;Cueing for back precautions Lower Body Dressing Details (indicate cue type and reason): education on adaptive equipment with LB dressing with reacher, sock aide, and dressing stick with patient able to return demonstration Toilet Transfer: Supervision/safety;Ambulation   Toileting- Clothing Manipulation and Hygiene: Supervision/safety         General ADL Comments: demonstrated good understanding of AE use, husband present    Extremity/Trunk Assessment              Vision       Perception     Praxis      Cognition Arousal/Alertness: Awake/alert Behavior During Therapy: Anxious Overall Cognitive Status: Impaired/Different from baseline Area of Impairment: Safety/judgement                         Safety/Judgement: Decreased  awareness of deficits, Decreased awareness of safety     General Comments: requires review of back precautions and need for brace wear when up        Exercises      Shoulder Instructions       General Comments education on correct wear of back brace and how  to donn    Pertinent Vitals/ Pain       Pain Assessment Pain Assessment: Faces Faces Pain Scale: Hurts even more Pain Location: back Pain Descriptors / Indicators: Sore, Discomfort, Grimacing Pain Intervention(s): Limited activity within patient's tolerance, Monitored during session, Repositioned, Patient requesting pain meds-RN notified  Home Living                                          Prior Functioning/Environment              Frequency  Min 2X/week        Progress Toward Goals  OT Goals(current goals can now be found in the care plan section)  Progress towards OT goals: Progressing toward goals  Acute Rehab OT Goals Patient Stated Goal: go home OT Goal Formulation: With patient Time For Goal Achievement: 10/20/22 Potential to Achieve Goals: Good ADL Goals Pt Will Perform Lower Body Bathing: with modified independence;sit to/from stand;with adaptive equipment Pt Will Perform Lower Body Dressing: with modified independence;sit to/from stand Pt Will Transfer to Toilet: with modified independence;ambulating Pt Will Perform Toileting - Clothing Manipulation and hygiene: with modified independence;sit to/from stand;sitting/lateral leans Additional ADL Goal #1: Pt will independently adhere to back precuations during ADL task Additional ADL Goal #2: Pt will donn back brace independently  Plan Discharge plan remains appropriate    Co-evaluation                 AM-PAC OT "6 Clicks" Daily Activity     Outcome Measure   Help from another person eating meals?: None Help from another person taking care of personal grooming?: A Little Help from another person toileting, which includes using toliet, bedpan, or urinal?: A Little Help from another person bathing (including washing, rinsing, drying)?: A Little Help from another person to put on and taking off regular upper body clothing?: A Little Help from another person to put on and taking off  regular lower body clothing?: A Little 6 Click Score: 19    End of Session Equipment Utilized During Treatment: Gait belt;Rollator (4 wheels)  OT Visit Diagnosis: Pain;Other abnormalities of gait and mobility (R26.89) Pain - part of body:  (back)   Activity Tolerance Patient tolerated treatment well   Patient Left in bed;with call bell/phone within reach;with family/visitor present   Nurse Communication Mobility status        Time: 2130-8657 OT Time Calculation (min): 32 min  Charges: OT General Charges $OT Visit: 1 Visit OT Treatments $Self Care/Home Management : 23-37 mins  Alfonse Flavors, OTA Acute Rehabilitation Services  Office [phone]   Dewain Penning 10/07/2022, 1:27 PM

## 2022-10-07 NOTE — Care Management Important Message (Signed)
Important Message  Patient Details  Name: Meghan Alexander MRN: [id number] Date of Birth: [birth year]   Medicare Important Message Given:  Yes     Marletta Bousquet Stefan Church 10/07/2022, 2:43 PM

## 2022-10-07 NOTE — Plan of Care (Signed)

## 2022-10-07 NOTE — Progress Notes (Signed)
PHARMACIST - PHYSICIAN COMMUNICATION  DR:  Renford Dills  CONCERNING: IV to Oral Route Change Policy  RECOMMENDATION: This patient is receiving protonix by the intravenous route.  Based on criteria approved by the Pharmacy and Therapeutics Committee, the intravenous medication(s) is/are being converted to the equivalent oral dose form(s).   DESCRIPTION: These criteria include: The patient is eating (either orally or via tube) and/or has been taking other orally administered medications for a least 24 hours The patient has no evidence of active gastrointestinal bleeding or impaired GI absorption (gastrectomy, short bowel, patient on TNA or NPO).  If you have questions about this conversion, please contact the Pharmacy Department  []   [phone] )  [facility] []   [phone] )  [facility] [x]   [phone] )  [facility] []   [phone] )  [facility] []   [phone] )  [facility]

## 2022-10-08 DIAGNOSIS — R112 Nausea with vomiting, unspecified: Secondary | ICD-10-CM | POA: Diagnosis not present

## 2022-10-08 LAB — GLUCOSE, CAPILLARY
Glucose-Capillary: 117 mg/dL — ABNORMAL HIGH (ref 70–99)
Glucose-Capillary: 121 mg/dL — ABNORMAL HIGH (ref 70–99)
Glucose-Capillary: 121 mg/dL — ABNORMAL HIGH (ref 70–99)
Glucose-Capillary: 128 mg/dL — ABNORMAL HIGH (ref 70–99)
Glucose-Capillary: 157 mg/dL — ABNORMAL HIGH (ref 70–99)

## 2022-10-08 MED ORDER — METOPROLOL SUCCINATE ER 50 MG PO TB24
50.0000 mg | ORAL_TABLET | Freq: Every day | ORAL | Status: DC
  Administered 2022-10-08 – 2022-10-09 (×2): 50 mg via ORAL
  Filled 2022-10-08 (×2): qty 1

## 2022-10-08 MED ORDER — LOSARTAN POTASSIUM 50 MG PO TABS
25.0000 mg | ORAL_TABLET | Freq: Every day | ORAL | Status: DC
  Administered 2022-10-08: 25 mg via ORAL
  Filled 2022-10-08: qty 1

## 2022-10-08 MED ORDER — OXYCODONE HCL 5 MG PO TABS
10.0000 mg | ORAL_TABLET | ORAL | Status: DC | PRN
  Administered 2022-10-08: 10 mg via ORAL
  Filled 2022-10-08 (×2): qty 2

## 2022-10-08 MED ORDER — POLYETHYLENE GLYCOL 3350 17 G PO PACK
17.0000 g | PACK | Freq: Every day | ORAL | Status: DC
  Administered 2022-10-08 – 2022-10-10 (×3): 17 g via ORAL
  Filled 2022-10-08 (×4): qty 1

## 2022-10-08 MED ORDER — SENNOSIDES-DOCUSATE SODIUM 8.6-50 MG PO TABS
1.0000 | ORAL_TABLET | Freq: Two times a day (BID) | ORAL | Status: DC
  Administered 2022-10-08 – 2022-10-10 (×5): 1 via ORAL
  Filled 2022-10-08 (×6): qty 1

## 2022-10-08 NOTE — Progress Notes (Signed)
PROGRESS NOTE  Juliah Gradillas  ZOX:[id number] DOB: 1955-11-10 DOA: 10/04/2022 PCP: Burton Apley, MD   Brief Narrative: Patient is a 67 year old female with history of Crohn's disease, achalasia, follicular lymphoma on steroids, insulin-dependent diabetes type 2, paroxysmal A-fib, ILD, hypertension who presented with bilateral lower extremity edema, pain, back pain vomiting, nausea.  Workup in the emergency department showed subacute T8 compression fracture.  Lab also showed AKI, hypokalemia.  Also was complaining of nausea and vomiting.  IR was consulted for compression fracture, underwent vertebroplasty on 6/12.  Consulted PT/OT.  Continued back pain prolonging hospital course, plan for discharge to home with home health after improvement in the back pain.  Assessment & Plan:  Principal Problem:   Intractable nausea and vomiting  Intractable nausea/vomiting: Nausea and vomiting have been better.  Initially suspected to be from pain medications.  Also has history of achalasia.  She denies any problem with swallowing. CT imaging showed  patulous, fluid-filled esophagus.  Speech therapy signed off ,patient has history of achalasia and follows with [facility], Dr. Dulce Sellar.  She has not appointment coming soon.  She is tolerating diet.  AKI: Baseline creatinine normal.  Presented with creatinine in the range of 1.3.  Resolved with IV fluids  Hypokalemia/hypomagnesemia: Currently being supplemented and monitored.  Known T8 compression fracture: No focal neurological deficits.  Patient was taking oral Dilaudid at home.  Minimize narcotics as much as possible.  Continue lidocaine patch, Robaxin.  PT/OT consulted.  Recommend home health.  IR consulted and underwent vertebroplasty on 6/12.  Apply TLSO brace when out of bed.  Current issue is the pain.  On IV Dilaudid and oxycodone.  Also lidocaine patch and muscle relaxants  Bilateral lower extremity edema: Ongoing for months.  Echo shows EF of 55 to 60%,  indeterminate diastolic filling.  Given her dose of Lasix 40 mg once.  Venous Doppler pending showed cystic structure on the right popliteal fossa.  No deep vein thrombosis  Diabetes type 2: On metformin at home.  Currently on sliding scale.  Monitor blood sugars  Hypertension: Hypertensive on presentation.  Monitor blood pressure, continue current medications  Crohns disease: Recently diagnosed.  Patient was started on Cyltezo-CD recently.  No history of hematochezia  History of follicular lymphoma: Follows with Dr. Candise Che as an outpatient.  On chronic steroid therapy.  Lung nodule: CT imaging showed stable left upper lobe nodules measuring up to 9 mm.we recommend follow-up imaging in 6-12 months to assess stability.  Constipation: Continue bowel regimen       DVT prophylaxis:enoxaparin (LOVENOX) injection 40 mg Start: 10/05/22 1000     Code Status: Full Code  Family Communication: None at bedside  Patient status:Inpatient  Patient is from :Home  Anticipated discharge WJ:XBJY  Estimated DC date:1-2 days,when back pain is better   Consultants: IR  Procedures:None  Antimicrobials:  Anti-infectives (From admission, onward)    Start     Dose/Rate Route Frequency Ordered Stop   10/05/22 1452  ceFAZolin (ANCEF) IVPB 2g/100 mL premix        over 30 Minutes Intravenous Continuous PRN 10/05/22 1518 10/05/22 1452   10/05/22 0045  cefTRIAXone (ROCEPHIN) 1 g in sodium chloride 0.9 % 100 mL IVPB        1 g 200 mL/hr over 30 Minutes Intravenous  Once 10/05/22 0044 10/05/22 0225       Subjective: Patient seen and examined at bedside today.  Hemodynamically stable.  She looks little comfortable than yesterday but continues to complain of  back pain.  She does not feel ready to go home yet.  Constipated.  Objective: Vitals:   10/07/22 1606 10/07/22 2057 10/08/22 0504 10/08/22 0742  BP: (!) 169/94 (!) 155/88 (!) 165/109 (!) 168/88  Pulse: 92 95 (!) 109 92  Resp:  18 18   Temp:   98.6 F (37 C) 98.7 F (37.1 C) 98.2 F (36.8 C)  TempSrc: Oral  Oral Oral  SpO2: 100% 99% 97% 96%  Weight:      Height:       No intake or output data in the 24 hours ending 10/08/22 1123  Filed Weights   10/04/22 2204  Weight: 80 kg    Examination:  General exam: Overall comfortable, not in distress,obese HEENT: PERRL Respiratory system:  no wheezes or crackles  Cardiovascular system: S1 & S2 heard, RRR.  Gastrointestinal system: Abdomen is nondistended, soft and nontender. Central nervous system: Alert and oriented Extremities: trace lower extremity edema, no clubbing ,no cyanosis Skin: No rashes, no ulcers,no icterus     Data Reviewed: I have personally reviewed following labs and imaging studies  CBC: Recent Labs  Lab 10/02/22 2152 10/04/22 1948 10/05/22 0003 10/05/22 0934 10/06/22 0412  WBC 3.4* 4.8  --  4.2 3.6*  NEUTROABS  --  2.8  --   --   --   HGB 10.7* 11.3* 9.5* 11.1* 9.9*  HCT 34.0* 36.3 28.0* 35.3* 32.4*  MCV 85.9 84.6  --  86.7 85.5  PLT 202 236  --  231 244   Basic Metabolic Panel: Recent Labs  Lab 10/02/22 2152 10/04/22 1948 10/05/22 0003 10/05/22 0934 10/05/22 1359 10/06/22 0412 10/07/22 0503  NA 141 139 140  --  143 139 141  K 3.6 3.2* 3.2*  --  3.1* 3.2* 3.8  CL 100 97*  --   --  100 104 102  CO2 27 26  --   --  27 26 28   GLUCOSE 203* 237*  --   --  78 127* 104*  BUN 12 8  --   --  6* 6* 5*  CREATININE 1.18* 1.31*  --  1.06* 1.03* 1.08* 1.05*  CALCIUM 8.8* 9.1  --   --  9.2 8.3* 8.8*  MG  --  1.3*  --  1.8 1.8 1.6* 2.0  PHOS  --   --   --  4.1 3.5  --   --      Recent Results (from the past 240 hour(s))  SARS Coronavirus 2 by RT PCR (hospital order, performed in [facility] hospital lab) *cepheid single result test* Anterior Nasal Swab     Status: None   Collection Time: 10/04/22 10:21 PM   Specimen: Anterior Nasal Swab  Result Value Ref Range Status   SARS Coronavirus 2 by RT PCR NEGATIVE NEGATIVE Final    Comment:  Performed at [facility], [address] [place], [place] [address]     Radiology Studies: VAS [place] LOWER EXTREMITY VENOUS (DVT)  Result Date: 10/06/2022  Lower Venous DVT Study Patient Name:  WILLINE SPRAGG  Date of Exam:   10/06/2022 Medical Rec #: [id number]       Accession #:    [id number] Date of Birth: 14-Apr-1956        Patient Gender: F Patient Age:   40 years Exam Location:  [facility] Procedure:      VAS [place] LOWER EXTREMITY VENOUS (DVT) Referring Phys: Junious Silk --------------------------------------------------------------------------------  Indications: Edema.  Risk Factors: Cancer  Lymphoma Chrohn's disease,. Comparison Study: Previous exam on 09/08/21 was negative for DVT Performing Technologist: Ernestene Mention RVT, RDMS  Examination Guidelines: A complete evaluation includes B-mode imaging, spectral Doppler, color Doppler, and power Doppler as needed of all accessible portions of each vessel. Bilateral testing is considered an integral part of a complete examination. Limited examinations for reoccurring indications may be performed as noted. The reflux portion of the exam is performed with the patient in reverse Trendelenburg.  +---------+---------------+---------+-----------+----------+-------------------+ RIGHT    CompressibilityPhasicitySpontaneityPropertiesThrombus Aging      +---------+---------------+---------+-----------+----------+-------------------+ CFV      Full           Yes      Yes                                      +---------+---------------+---------+-----------+----------+-------------------+ SFJ      Full                                                             +---------+---------------+---------+-----------+----------+-------------------+ FV Prox  Full           Yes      Yes                                      +---------+---------------+---------+-----------+----------+-------------------+ FV Mid   Full           Yes      Yes                                       +---------+---------------+---------+-----------+----------+-------------------+ FV DistalFull           Yes      Yes                                      +---------+---------------+---------+-----------+----------+-------------------+ PFV      Full                                                             +---------+---------------+---------+-----------+----------+-------------------+ POP      Full           Yes      Yes                                      +---------+---------------+---------+-----------+----------+-------------------+ PTV      Full                                                             +---------+---------------+---------+-----------+----------+-------------------+ PERO  Full                                         Not well visualized +---------+---------------+---------+-----------+----------+-------------------+   +---------+---------------+---------+-----------+----------+-------------------+ LEFT     CompressibilityPhasicitySpontaneityPropertiesThrombus Aging      +---------+---------------+---------+-----------+----------+-------------------+ CFV      Full           Yes      Yes                                      +---------+---------------+---------+-----------+----------+-------------------+ SFJ      Full                                                             +---------+---------------+---------+-----------+----------+-------------------+ FV Prox  Full           Yes      Yes                                      +---------+---------------+---------+-----------+----------+-------------------+ FV Mid   Full           Yes      Yes                                      +---------+---------------+---------+-----------+----------+-------------------+ FV DistalFull           Yes      Yes                                       +---------+---------------+---------+-----------+----------+-------------------+ PFV      Full                                                             +---------+---------------+---------+-----------+----------+-------------------+ POP      Full           Yes      Yes                                      +---------+---------------+---------+-----------+----------+-------------------+ PTV      Full                                                             +---------+---------------+---------+-----------+----------+-------------------+ PERO     Full  Not well visualized +---------+---------------+---------+-----------+----------+-------------------+     Summary: BILATERAL: - No evidence of deep vein thrombosis seen in the lower extremities, bilaterally. - RIGHT: - A large cystic structure with internal calcifications is found in the popliteal fossa (6.8 x 1.0 x 4.8 cm).  LEFT: - No cystic structure found in the popliteal fossa.  *See table(s) above for measurements and observations. Electronically signed by Sherald Hess MD on 10/06/2022 at 1:24:52 PM.    Final     Scheduled Meds:  enoxaparin (LOVENOX) injection  40 mg Subcutaneous Q24H   insulin aspart  0-9 Units Subcutaneous Q12H   ketorolac  15 mg Intravenous Once   lidocaine  1 patch Transdermal Q24H   losartan  25 mg Oral Daily   metoprolol succinate  50 mg Oral Daily   pantoprazole  40 mg Oral BID   polyethylene glycol  17 g Oral Daily   senna-docusate  1 tablet Oral BID   sodium chloride flush  3 mL Intravenous Q12H   Continuous Infusions:  methocarbamol (ROBAXIN) IV       LOS: 3 days   Burnadette Pop, MD [facility] P6/15/2024, 11:23 AM

## 2022-10-09 ENCOUNTER — Inpatient Hospital Stay (HOSPITAL_COMMUNITY): Payer: Medicare Other

## 2022-10-09 DIAGNOSIS — R112 Nausea with vomiting, unspecified: Secondary | ICD-10-CM | POA: Diagnosis not present

## 2022-10-09 LAB — CBC
HCT: 35.1 % — ABNORMAL LOW (ref 36.0–46.0)
Hemoglobin: 11.3 g/dL — ABNORMAL LOW (ref 12.0–15.0)
MCH: 26.2 pg (ref 26.0–34.0)
MCHC: 32.2 g/dL (ref 30.0–36.0)
MCV: 81.4 fL (ref 80.0–100.0)
Platelets: 279 10*3/uL (ref 150–400)
RBC: 4.31 MIL/uL (ref 3.87–5.11)
RDW: 14.8 % (ref 11.5–15.5)
WBC: 4.7 10*3/uL (ref 4.0–10.5)
nRBC: 0 % (ref 0.0–0.2)

## 2022-10-09 LAB — BASIC METABOLIC PANEL
Anion gap: 11 (ref 5–15)
BUN: 8 mg/dL (ref 8–23)
CO2: 30 mmol/L (ref 22–32)
Calcium: 9.2 mg/dL (ref 8.9–10.3)
Chloride: 98 mmol/L (ref 98–111)
Creatinine, Ser: 0.94 mg/dL (ref 0.44–1.00)
GFR, Estimated: >60 mL/min (ref >60)
Glucose, Bld: 126 mg/dL — ABNORMAL HIGH (ref 70–99)
Potassium: 3.3 mmol/L — ABNORMAL LOW (ref 3.5–5.1)
Sodium: 139 mmol/L (ref 135–145)

## 2022-10-09 LAB — GLUCOSE, CAPILLARY
Glucose-Capillary: 126 mg/dL — ABNORMAL HIGH (ref 70–99)
Glucose-Capillary: 189 mg/dL — ABNORMAL HIGH (ref 70–99)
Glucose-Capillary: 202 mg/dL — ABNORMAL HIGH (ref 70–99)
Glucose-Capillary: 153 mg/dL — ABNORMAL HIGH (ref 70–99)

## 2022-10-09 MED ORDER — KETOROLAC TROMETHAMINE 15 MG/ML IJ SOLN: 15.0000 mg | Freq: Three times a day (TID) | INTRAMUSCULAR | Status: DC | PRN

## 2022-10-09 MED ORDER — POTASSIUM CHLORIDE CRYS ER 20 MEQ PO TBCR
40.0000 meq | EXTENDED_RELEASE_TABLET | Freq: Once | ORAL | Status: AC
  Administered 2022-10-09: 40 meq via ORAL
  Filled 2022-10-09: qty 2

## 2022-10-09 MED ORDER — TRAMADOL HCL 50 MG PO TABS
100.0000 mg | ORAL_TABLET | Freq: Three times a day (TID) | ORAL | Status: DC | PRN
  Administered 2022-10-09 – 2022-10-11 (×3): 100 mg via ORAL
  Filled 2022-10-09 (×4): qty 2

## 2022-10-09 MED ORDER — LOSARTAN POTASSIUM 50 MG PO TABS
50.0000 mg | ORAL_TABLET | Freq: Every day | ORAL | Status: DC
  Administered 2022-10-09 – 2022-10-11 (×3): 50 mg via ORAL
  Filled 2022-10-09 (×3): qty 1

## 2022-10-09 MED ORDER — METHOCARBAMOL 500 MG PO TABS
500.0000 mg | ORAL_TABLET | Freq: Three times a day (TID) | ORAL | Status: DC
  Administered 2022-10-09 – 2022-10-10 (×3): 500 mg via ORAL
  Filled 2022-10-09 (×3): qty 1

## 2022-10-09 NOTE — Plan of Care (Signed)

## 2022-10-09 NOTE — Progress Notes (Signed)
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  Workup in the emergency department showed subacute T8 compression fracture.  Lab also showed AKI, hypokalemia.  Also was complaining of nausea and vomiting.  IR was consulted for compression fracture, underwent vertebroplasty on 6/12.  Consulted PT/OT.  Continued back pain prolonging hospital course, plan for discharge to home with home health after improvement in the back pain.  Assessment & Plan:  Principal Problem:   Intractable nausea and vomiting  Intractable nausea/vomiting: Nausea and vomiting have been better.  Initially suspected to be from pain medications.  Also has history of achalasia.  She denies any problem with swallowing. CT imaging showed  patulous, fluid-filled esophagus.  Speech therapy signed off ,patient has history of achalasia and follows with [facility], Dr. Dulce Sellar.  She has not appointment coming soon.  She is tolerating diet.  AKI: Baseline creatinine normal.  Presented with creatinine in the range of 1.3.  Resolved with IV fluids  Hypokalemia/hypomagnesemia: Currently being supplemented and monitored.  Known T8 compression fracture: No focal neurological deficits.  Patient was taking oral Dilaudid at home.   PT/OT consulted.  Recommend home health.  IR consulted and underwent vertebroplasty on 6/12.  Apply TLSO brace when out of bed.  Current issue is the pain.  On IV Dilaudid and oxycodone.  Also lidocaine patch and muscle relaxants.  Change oxycodone to Toradol today.  Also ordered Robaxin and scheduled dose.  Continues to complain of back pain.  Bilateral lower extremity edema: Ongoing  for months.  Echo shows EF of 55 to 60%, indeterminate diastolic filling.  Given her dose of Lasix 40 mg once.  Venous Doppler pending showed cystic structure on the right popliteal fossa.  No deep vein thrombosis  Diabetes type 2: On metformin at home.  Currently on sliding scale.  Monitor blood sugars  Hypertension: Hypertensive on presentation.  Monitor blood pressure, continue current medications.  Med suggested  Crohns disease: Recently diagnosed.  Patient was started on Cyltezo-CD recently.  No history of hematochezia  History of follicular lymphoma: Follows with Dr. Candise Che as an outpatient.  On chronic steroid therapy.  Lung nodule: CT imaging showed stable left upper lobe nodules measuring up to 9 mm.we recommend follow-up imaging in 6-12 months to assess stability.  Constipation: Continue bowel regimen       DVT prophylaxis:enoxaparin (LOVENOX) injection 40 mg Start: 10/05/22 1000     Code Status: Full Code  Family Communication: None at bedside  Patient status:Inpatient  Patient is from :Home  Anticipated discharge AO:ZHYQ  Estimated DC date:1-2 days,when back pain is better   Consultants: IR  Procedures:None  Antimicrobials:  Anti-infectives (From admission, onward)    Start     Dose/Rate Route Frequency Ordered Stop   10/05/22 1452  ceFAZolin (ANCEF) IVPB 2g/100 mL premix        over 30 Minutes Intravenous Continuous PRN 10/05/22 1518 10/05/22 1452   10/05/22 0045  cefTRIAXone (ROCEPHIN) 1 g in sodium chloride 0.9 % 100 mL IVPB        1 g 200 mL/hr over 30 Minutes Intravenous  Once 10/05/22 0044 10/05/22 0225       Subjective: Patient seen and examined at bedside today.  Hemodynamically stable.  She continues to complain of severe back pain.  We had a long discussion at the bedside about her back pain management.  See cannot tolerate oxycodone so it was changed to tramadol.  Also ordered Toradol, Robaxin.  Having bowel movements.  Objective: Vitals:    10/08/22 1600 10/08/22 2057 10/09/22 0512 10/09/22 0700  BP: (!) 169/89 (!) 180/95 (!) 158/92 (!) 155/101  Pulse:  85 (!) 105 (!) 105  Resp:  18 18 18   Temp:  98.6 F (37 C) 98.3 F (36.8 C) 97.8 F (36.6 C)  TempSrc:    Oral  SpO2: 96% 100% 99% 98%  Weight:      Height:        Intake/Output Summary (Last 24 hours) at 10/09/2022 1110 Last data filed at 10/09/2022 0804 Gross per 24 hour  Intake 3 ml  Output --  Net 3 ml    Filed Weights   10/04/22 2204  Weight: 80 kg    Examination:  General exam: Overall comfortable, not in distress,obese HEENT: PERRL Respiratory system:  no wheezes or crackles  Cardiovascular system: S1 & S2 heard, RRR.  Gastrointestinal system: Abdomen is nondistended, soft and nontender. Central nervous system: Alert and oriented Extremities: No edema, no clubbing ,no cyanosis Skin: No rashes, no ulcers,no icterus     Data Reviewed: I have personally reviewed following labs and imaging studies  CBC: Recent Labs  Lab 10/02/22 2152 10/04/22 1948 10/05/22 0003 10/05/22 0934 10/06/22 0412 10/09/22 0508  WBC 3.4* 4.8  --  4.2 3.6* 4.7  NEUTROABS  --  2.8  --   --   --   --   HGB 10.7* 11.3* 9.5* 11.1* 9.9* 11.3*  HCT 34.0* 36.3 28.0* 35.3* 32.4* 35.1*  MCV 85.9 84.6  --  86.7 85.5 81.4  PLT 202 236  --  231 244 279   Basic Metabolic Panel: Recent Labs  Lab 10/04/22 1948 10/05/22 0003 10/05/22 0934 10/05/22 1359 10/06/22 0412 10/07/22 0503 10/09/22 0508  NA 139 140  --  143 139 141 139  K 3.2* 3.2*  --  3.1* 3.2* 3.8 3.3*  CL 97*  --   --  100 104 102 98  CO2 26  --   --  27 26 28 30   GLUCOSE 237*  --   --  78 127* 104* 126*  BUN 8  --   --  6* 6* 5* 8  CREATININE 1.31*  --  1.06* 1.03* 1.08* 1.05* 0.94  CALCIUM 9.1  --   --  9.2 8.3* 8.8* 9.2  MG 1.3*  --  1.8 1.8 1.6* 2.0  --   PHOS  --   --  4.1 3.5  --   --   --      Recent Results (from the past 240 hour(s))  SARS Coronavirus 2 by RT PCR (hospital order, performed in  [facility] hospital lab) *cepheid single result test* Anterior Nasal Swab     Status: None   Collection Time: 10/04/22 10:21 PM   Specimen: Anterior Nasal Swab  Result Value Ref Range Status   SARS Coronavirus 2 by RT PCR NEGATIVE NEGATIVE Final    Comment: Performed at [facility], [address], [place], [place] [address]     Radiology Studies: No results found.  Scheduled Meds:  enoxaparin (LOVENOX) injection  40 mg Subcutaneous Q24H   insulin aspart  0-9 Units Subcutaneous Q12H   lidocaine  1 patch Transdermal Q24H   losartan  50 mg Oral Daily   methocarbamol  500 mg Oral TID   metoprolol succinate  50 mg Oral Daily   pantoprazole  40 mg Oral BID   polyethylene glycol  17 g Oral Daily   senna-docusate  1 tablet Oral BID   sodium chloride flush  3 mL Intravenous Q12H   Continuous Infusions:     LOS: 4 days   Burnadette Pop, MD [facility] P6/16/2024, 11:10 AM

## 2022-10-09 NOTE — Progress Notes (Signed)
Referring Physician(s): Junious Silk, NP   Supervising Physician: Julieanne Cotton  Patient Status:  [facility] - In-pt  Chief Complaint:  Upper back pain, possible T7/T8 compression fx S/p T8 VP by Dr. Corliss Skains on 6/12  - No true compression fx seen in T7 body during the procedure.   Subjective:  Patient sleeping on presentation to room, accompanied by her husband at bedside. She states her back pain is still at the same level of about a 6-7/10 and that she has needed to continue her IV pain medication.   Allergies: Latex, Codeine, Atenolol, Other, Hydrocodone, and Oxycodone  Medications: Prior to Admission medications   Medication Sig Start Date End Date Taking? Authorizing Provider  BENADRYL ALLERGY 25 MG tablet Take 25 mg by mouth at bedtime.   Yes [provider]  CYLTEZO-CD/UC/HS STARTER 40 MG/0.8ML AJKT Inject 160 mLs into the skin every 14 (fourteen) days. Started 09/10/2022. 09/09/22  Yes [provider]  HUMALOG KWIKPEN 100 UNIT/ML KwikPen Inject 5 Units into the skin with breakfast, with lunch, and with evening meal. Patient taking differently: Inject 15 Units into the skin See admin instructions. Inject 15 units into the skin before breakfast and an additional 15 units once a day as needed for elevated BGL 10/11/21  Yes Littie Deeds, MD  insulin detemir (LEVEMIR) 100 UNIT/ML FlexPen Inject 10 Units into the skin daily. Patient taking differently: Inject 20 Units into the skin See admin instructions. Inject 20 units into the skin in the morning as needed for elevated BGL 10/11/21  Yes Littie Deeds, MD  losartan (COZAAR) 50 MG tablet Take 25 mg by mouth in the morning.   Yes [provider]  Melatonin 10 MG TABS Take 10 mg by mouth at bedtime.   Yes [provider]  metFORMIN (GLUCOPHAGE) 500 MG tablet Take 500 mg by mouth daily with breakfast. 07/23/19  Yes [provider]  metoprolol succinate (TOPROL-XL) 25 MG 24 hr tablet Take 1  tablet (25 mg total) by mouth daily. 10/12/21  Yes Littie Deeds, MD  Multiple Vitamin (MULTIVITAMIN) tablet Take 1 tablet by mouth daily with breakfast.   Yes [provider]  omeprazole (PRILOSEC) 20 MG capsule Take 20 mg by mouth daily before breakfast. 11/28/20  Yes [provider]  ondansetron (ZOFRAN) 4 MG tablet Take 1 tablet (4 mg total) by mouth every 8 (eight) hours as needed for nausea or vomiting. 10/03/22  Yes Tilden Fossa, MD  oxyCODONE (ROXICODONE) 5 MG immediate release tablet Take 1 tablet (5 mg total) by mouth every 6 (six) hours as needed for up to 10 doses for severe pain. 09/26/22  Yes Mardene Sayer, MD  rosuvastatin (CRESTOR) 10 MG tablet Take 10 mg by mouth daily.   Yes [provider]  saccharomyces boulardii (FLORASTOR) 250 MG capsule Take 250 mg by mouth in the morning.   Yes [provider]  TURMERIC PO Take 1 capsule by mouth at bedtime.   Yes [provider]  venlafaxine XR (EFFEXOR-XR) 37.5 MG 24 hr capsule Take 37.5 mg by mouth daily with breakfast.   Yes [provider]  Accu-Chek Softclix Lancets lancets Use to test blood sugars up to 4 times daily as directed. 08/09/21   Drema Dallas, MD  Blood Glucose Monitoring Suppl (BLOOD GLUCOSE MONITOR SYSTEM) w/Device KIT Use to test blood sugars up to 4 times daily. 08/09/21   Drema Dallas, MD  Continuous Blood Gluc Sensor (FREESTYLE LIBRE 2 SENSOR) MISC Inject 1  Device into the skin every 14 (fourteen) days.    [provider]  diltiazem (CARDIZEM CD) 240 MG 24 hr capsule Take 1 capsule (240 mg total) by mouth daily. Patient not taking: Reported on 10/05/2022 10/11/21   Littie Deeds, MD  glucose blood (ACCU-CHEK GUIDE) test strip Use to test blood sugars up to 4 times daily as directed. 08/09/21   Drema Dallas, MD  Insulin Pen Needle 32G X 4 MM MISC Use to inject insulin up to 4 times daily as directed. 08/09/21   Drema Dallas, MD  lidocaine (LIDODERM) 5  % Place 1 patch onto the skin daily. Remove & Discard patch within 12 hours or as directed by MD Patient not taking: Reported on 10/05/2022 09/26/22   Mardene Sayer, MD  predniSONE (DELTASONE) 10 MG tablet Take 20 mg by mouth daily. Patient not taking: Reported on 10/05/2022 09/07/22   [provider]     Vital Signs: BP (!) 155/101 (BP Location: Right Arm)   Pulse (!) 105   Temp 97.8 F (36.6 C) (Oral)   Resp 18   Ht 5\' 2"  (1.575 m)   Wt 176 lb 5.9 oz (80 kg)   SpO2 98%   BMI 32.26 kg/m   Physical Exam Vitals reviewed.  Constitutional:      General: She is not in acute distress.    Appearance: She is not ill-appearing.  Pulmonary:     Effort: Pulmonary effort is normal.  Neurological:     Mental Status: She is alert and oriented to person, place, and time.  Psychiatric:        Mood and Affect: Mood normal.        Behavior: Behavior normal.   All extremities with 5/5 strength, sensation intact in all extremities.  Imaging: IR VERTEBROPLASTY CERV/THOR BX INC UNI/BIL INC/INJECT/IMAGING  Result Date: 10/07/2022 INDICATION: Severe low back pain secondary to compression fracture at T8. EXAM: VERTEBROPLASTY AT T8 MEDICATIONS: As antibiotic prophylaxis, Ancef 2 g IV was ordered pre-procedure and administered intravenously within 1 hour of incision. All current medications are in the EMR and have been reviewed as part of this encounter. ANESTHESIA/SEDATION: Moderate (conscious) sedation was employed during this procedure. A total of Versed 2 mg and Fentanyl 100 mcg was administered intravenously by the radiology nurse. Total intra-service moderate Sedation Time: 58 minutes. The patient's level of consciousness and vital signs were monitored continuously by radiology nursing throughout the procedure under my direct supervision. FLUOROSCOPY: Radiation Exposure Index (as provided by the fluoroscopic device): 901 mGy Kerma COMPLICATIONS: None immediate. TECHNIQUE: Informed written  consent was obtained from the patient after a thorough discussion of the procedural risks, benefits and alternatives. All questions were addressed. Maximal Sterile Barrier Technique was utilized including caps, mask, sterile gowns, sterile gloves, sterile drape, hand hygiene and skin antiseptic. A timeout was performed prior to the initiation of the procedure. PROCEDURE: The patient was placed prone on the fluoroscopic table. Nasal oxygen was administered. Physiologic monitoring was performed throughout the duration of the procedure. The skin overlying the thoracolumbar region was prepped and draped in the usual sterile fashion. The T8 vertebral body was identified and the left pedicle was infiltrated with 0.25% Bupivacaine. This was then followed by the advancement of a 13-gauge Cook needle through the left pedicle into the posterior one-third at T8. Two passes were made with a 16 gauge core biopsy needle. Samples were sent off for pathologic analysis as per the request of the patient's physician. The 13  gauge Cook spinal needle was then advanced into the anterior 1/3. A gentle contrast injection demonstrated a trabecular pattern of contrast with opacification of a paraspinal vein. This necessitated the use of Gelfoam pledgets into the 13 gauge Cook spinal needle to avoid embolization of the paraspinous vein. At this time, methylmethacrylate mixture was reconstituted. Under biplane intermittent fluoroscopy, the methylmethacrylate was then injected into the T8 vertebral body with filling of the vertebral body along the inferior and also the fractured superior endplate. No extravasation was noted into the disk spaces or posteriorly into the spinal canal. No epidural venous contamination was seen. The needle was then removed. Hemostasis was achieved at the skin entry site. There were no acute complications. Patient tolerated the procedure well. The patient was returned to the floor in stable condition. IMPRESSION: 1.  Status post vertebral body augmentation for painful compression fracture at T8 using vertebroplasty technique. 2.  Core biopsies were obtained and sent for pathologic analysis. If the patient has known osteoporosis, recommend treatment as clinically indicated. If the patient's bone density status is unknown, DEXA scan is recommended. Electronically Signed   By: Julieanne Cotton M.D.   On: 10/07/2022 08:45   VAS [place] LOWER EXTREMITY VENOUS (DVT)  Result Date: 10/06/2022  Lower Venous DVT Study Patient Name:  ALIMA ALTO  Date of Exam:   10/06/2022 Medical Rec #: [id number]       Accession #:    [id number] Date of Birth: 07/25/1955        Patient Gender: F Patient Age:   35 years Exam Location:  [facility] Procedure:      VAS [place] LOWER EXTREMITY VENOUS (DVT) Referring Phys: Junious Silk --------------------------------------------------------------------------------  Indications: Edema.  Risk Factors: Cancer Lymphoma Chrohn's disease,. Comparison Study: Previous exam on 09/08/21 was negative for DVT Performing Technologist: Ernestene Mention RVT, RDMS  Examination Guidelines: A complete evaluation includes B-mode imaging, spectral Doppler, color Doppler, and power Doppler as needed of all accessible portions of each vessel. Bilateral testing is considered an integral part of a complete examination. Limited examinations for reoccurring indications may be performed as noted. The reflux portion of the exam is performed with the patient in reverse Trendelenburg.  +---------+---------------+---------+-----------+----------+-------------------+ RIGHT    CompressibilityPhasicitySpontaneityPropertiesThrombus Aging      +---------+---------------+---------+-----------+----------+-------------------+ CFV      Full           Yes      Yes                                      +---------+---------------+---------+-----------+----------+-------------------+ SFJ      Full                                                              +---------+---------------+---------+-----------+----------+-------------------+ FV Prox  Full           Yes      Yes                                      +---------+---------------+---------+-----------+----------+-------------------+ FV Mid   Full           Yes  Yes                                      +---------+---------------+---------+-----------+----------+-------------------+ FV DistalFull           Yes      Yes                                      +---------+---------------+---------+-----------+----------+-------------------+ PFV      Full                                                             +---------+---------------+---------+-----------+----------+-------------------+ POP      Full           Yes      Yes                                      +---------+---------------+---------+-----------+----------+-------------------+ PTV      Full                                                             +---------+---------------+---------+-----------+----------+-------------------+ PERO     Full                                         Not well visualized +---------+---------------+---------+-----------+----------+-------------------+   +---------+---------------+---------+-----------+----------+-------------------+ LEFT     CompressibilityPhasicitySpontaneityPropertiesThrombus Aging      +---------+---------------+---------+-----------+----------+-------------------+ CFV      Full           Yes      Yes                                      +---------+---------------+---------+-----------+----------+-------------------+ SFJ      Full                                                             +---------+---------------+---------+-----------+----------+-------------------+ FV Prox  Full           Yes      Yes                                       +---------+---------------+---------+-----------+----------+-------------------+ FV Mid   Full           Yes      Yes                                      +---------+---------------+---------+-----------+----------+-------------------+  FV DistalFull           Yes      Yes                                      +---------+---------------+---------+-----------+----------+-------------------+ PFV      Full                                                             +---------+---------------+---------+-----------+----------+-------------------+ POP      Full           Yes      Yes                                      +---------+---------------+---------+-----------+----------+-------------------+ PTV      Full                                                             +---------+---------------+---------+-----------+----------+-------------------+ PERO     Full                                         Not well visualized +---------+---------------+---------+-----------+----------+-------------------+     Summary: BILATERAL: - No evidence of deep vein thrombosis seen in the lower extremities, bilaterally. - RIGHT: - A large cystic structure with internal calcifications is found in the popliteal fossa (6.8 x 1.0 x 4.8 cm).  LEFT: - No cystic structure found in the popliteal fossa.  *See table(s) above for measurements and observations. Electronically signed by Sherald Hess MD on 10/06/2022 at 1:24:52 PM.    Final    ECHOCARDIOGRAM COMPLETE  Result Date: 10/06/2022    ECHOCARDIOGRAM REPORT   Patient Name:   ANONA Cephas Date of Exam: 10/06/2022 Medical Rec #:  [id number]      Height:       62.0 in Accession #:    [id number]     Weight:       176.4 lb Date of Birth:  1955/05/08       BSA:          1.812 m Patient Age:    67 years       BP:           162/96 mmHg Patient Gender: F              HR:           101 bpm. Exam Location:  Inpatient Procedure: 2D Echo, Cardiac Doppler,  Color Doppler and Intracardiac            Opacification Agent Indications:    Edema  History:        Patient has prior history of Echocardiogram examinations, most                 recent 10/08/2021. Arrythmias:Atrial Fibrillation; Risk  Factors:Diabetes and Hypertension. ILD, lymphoma.  Sonographer:    Milda Smart Referring Phys: [id number] ALLISON L ELLIS  Sonographer Comments: Image acquisition challenging due to patient body habitus and Image acquisition challenging due to respiratory motion. IMPRESSIONS  1. Left ventricular ejection fraction, by estimation, is 55 to 60%. The left ventricle has normal function. The left ventricle has no regional wall motion abnormalities. Indeterminate diastolic filling due to E-A fusion.  2. Right ventricular systolic function is normal. The right ventricular size is normal.  3. The mitral valve is normal in structure. Mild mitral valve regurgitation. No evidence of mitral stenosis.  4. The aortic valve is normal in structure. Aortic valve regurgitation is not visualized. No aortic stenosis is present.  5. The inferior vena cava is normal in size with greater than 50% respiratory variability, suggesting right atrial pressure of 3 mmHg. FINDINGS  Left Ventricle: Left ventricular ejection fraction, by estimation, is 55 to 60%. The left ventricle has normal function. The left ventricle has no regional wall motion abnormalities. The left ventricular internal cavity size was normal in size. There is  no left ventricular hypertrophy. Indeterminate diastolic filling due to E-A fusion. Right Ventricle: The right ventricular size is normal. No increase in right ventricular wall thickness. Right ventricular systolic function is normal. Left Atrium: Left atrial size was normal in size. Right Atrium: Right atrial size was normal in size. Pericardium: There is no evidence of pericardial effusion. Mitral Valve: The mitral valve is normal in structure. There is mild calcification  of the mitral valve leaflet(s). Mild mitral valve regurgitation. No evidence of mitral valve stenosis. Tricuspid Valve: The tricuspid valve is normal in structure. Tricuspid valve regurgitation is not demonstrated. No evidence of tricuspid stenosis. Aortic Valve: The aortic valve is normal in structure. Aortic valve regurgitation is not visualized. No aortic stenosis is present. Pulmonic Valve: The pulmonic valve was not well visualized. Pulmonic valve regurgitation is not visualized. No evidence of pulmonic stenosis. Aorta: The aortic root is normal in size and structure. Venous: The inferior vena cava is normal in size with greater than 50% respiratory variability, suggesting right atrial pressure of 3 mmHg. IAS/Shunts: No atrial level shunt detected by color flow Doppler.  LEFT VENTRICLE PLAX 2D LVIDd:         4.10 cm     Diastology LVIDs:         3.20 cm     LV e' medial:    5.11 cm/s LV PW:         0.90 cm     LV E/e' medial:  22.1 LV IVS:        0.70 cm     LV e' lateral:   7.29 cm/s LVOT diam:     2.30 cm     LV E/e' lateral: 15.5 LV SV:         70 LV SV Index:   39 LVOT Area:     4.15 cm  LV Volumes (MOD) LV vol d, MOD A2C: 86.1 ml LV vol d, MOD A4C: 84.8 ml LV vol s, MOD A2C: 39.0 ml LV vol s, MOD A4C: 38.6 ml LV SV MOD A2C:     47.1 ml LV SV MOD A4C:     84.8 ml LV SV MOD BP:      46.5 ml RIGHT VENTRICLE RV S prime:     9.25 cm/s TAPSE (M-mode): 1.3 cm LEFT ATRIUM             Index  RIGHT ATRIUM          Index LA diam:        3.80 cm 2.10 cm/m   RA Area:     7.52 cm LA Vol (A2C):   27.8 ml 15.34 ml/m  RA Volume:   12.90 ml 7.12 ml/m LA Vol (A4C):   29.3 ml 16.17 ml/m LA Biplane Vol: 30.4 ml 16.78 ml/m  AORTIC VALVE LVOT Vmax:   89.40 cm/s LVOT Vmean:  71.800 cm/s LVOT VTI:    0.168 m  AORTA Ao Root diam: 3.00 cm Ao Asc diam:  3.70 cm MITRAL VALVE MV Area (PHT): 4.80 cm     SHUNTS MV Decel Time: 158 msec     Systemic VTI:  0.17 m MR Peak grad: 89.1 mmHg     Systemic Diam: 2.30 cm MR Vmax:       472.00 cm/s MV E velocity: 113.00 cm/s Arvilla Meres MD Electronically signed by Arvilla Meres MD Signature Date/Time: 10/06/2022/11:19:14 AM    Final     Labs:  CBC: Recent Labs    10/04/22 1948 10/05/22 0003 10/05/22 0934 10/06/22 0412 10/09/22 0508  WBC 4.8  --  4.2 3.6* 4.7  HGB 11.3* 9.5* 11.1* 9.9* 11.3*  HCT 36.3 28.0* 35.3* 32.4* 35.1*  PLT 236  --  231 244 279     COAGS: Recent Labs    10/05/22 1224  INR 1.1     BMP: Recent Labs    10/05/22 1359 10/06/22 0412 10/07/22 0503 10/09/22 0508  NA 143 139 141 139  K 3.1* 3.2* 3.8 3.3*  CL 100 104 102 98  CO2 27 26 28 30   GLUCOSE 78 127* 104* 126*  BUN 6* 6* 5* 8  CALCIUM 9.2 8.3* 8.8* 9.2  CREATININE 1.03* 1.08* 1.05* 0.94  GFRNONAA 59* 56* 58* >60     LIVER FUNCTION TESTS: Recent Labs    07/31/22 0529 09/26/22 0859 10/03/22 0019 10/04/22 1948  BILITOT 0.4 0.4 0.5 0.8  AST 40 37 28 28  ALT 56* 60* 32 31  ALKPHOS 78 119 80 81  PROT 5.5* 6.5 5.3* 5.8*  ALBUMIN 3.4* 4.3 3.2* 3.4*     Assessment and Plan:  67 y.o. female with upper back pain  s/p T8 VP by Dr. Corliss Skains on 6/12.   Still in pain, pain is about the same but has not gotten worse.  Patient is still requiring IV Dilaudid 0.5 mg PRN 2hr, PO oxy 5 mg PRN 4hr, and lidocaine patch   Patient has been unable to wean off of IV Dilaudid this weekend as previously hoped for.  Upper back pain remains significant, recommend MR thoracic spine w/o contrast for further evaluation.  Recommend walk with a walker to prevent fall/aid with the upper back pain.   Further treatment plan per [facility] team.  Appreciate and agree with the plan.  NIR will follow.    Electronically Signed: Kennieth Francois, PA-C 10/09/2022, 2:22 PM   I spent a total of 15 Minutes at the the patient's bedside AND on the patient's hospital floor or unit, greater than 50% of which was counseling/coordinating care for s/p T8 VP.

## 2022-10-10 DIAGNOSIS — R112 Nausea with vomiting, unspecified: Secondary | ICD-10-CM | POA: Diagnosis not present

## 2022-10-10 LAB — BASIC METABOLIC PANEL
Anion gap: 11 (ref 5–15)
BUN: 12 mg/dL (ref 8–23)
CO2: 29 mmol/L (ref 22–32)
Calcium: 9.3 mg/dL (ref 8.9–10.3)
Chloride: 97 mmol/L — ABNORMAL LOW (ref 98–111)
Creatinine, Ser: 0.99 mg/dL (ref 0.44–1.00)
GFR, Estimated: >60 mL/min (ref >60)
Glucose, Bld: 156 mg/dL — ABNORMAL HIGH (ref 70–99)
Potassium: 3.5 mmol/L (ref 3.5–5.1)
Sodium: 137 mmol/L (ref 135–145)

## 2022-10-10 LAB — GLUCOSE, CAPILLARY
Glucose-Capillary: 155 mg/dL — ABNORMAL HIGH (ref 70–99)
Glucose-Capillary: 156 mg/dL — ABNORMAL HIGH (ref 70–99)
Glucose-Capillary: 131 mg/dL — ABNORMAL HIGH (ref 70–99)
Glucose-Capillary: 150 mg/dL — ABNORMAL HIGH (ref 70–99)
Glucose-Capillary: 115 mg/dL — ABNORMAL HIGH (ref 70–99)

## 2022-10-10 MED ORDER — VENLAFAXINE HCL ER 37.5 MG PO CP24
37.5000 mg | ORAL_CAPSULE | Freq: Every day | ORAL | Status: DC
  Administered 2022-10-10 – 2022-10-11 (×2): 37.5 mg via ORAL
  Filled 2022-10-10 (×2): qty 1

## 2022-10-10 MED ORDER — METOPROLOL SUCCINATE ER 50 MG PO TB24: 75.0000 mg | ORAL_TABLET | Freq: Every day | ORAL | Status: DC

## 2022-10-10 MED ORDER — METOPROLOL SUCCINATE ER 50 MG PO TB24
100.0000 mg | ORAL_TABLET | Freq: Every day | ORAL | Status: DC
  Administered 2022-10-10 – 2022-10-11 (×2): 100 mg via ORAL
  Filled 2022-10-10 (×2): qty 2

## 2022-10-10 NOTE — Progress Notes (Signed)
Physical Therapy Treatment Patient Details Name: Meghan Alexander MRN: [id number] DOB: 1955-08-07 Today's Date: 10/10/2022   History of Present Illness 67 yo female presents to [facility] on 6/9 with back pain x2 weeks, imaging shows subacute T7-8 compression fx, s/p T7-8 vertebroplasty on 6/12. PMH:  R ankle fx ORIF of ankle, T2DM, HTN, follicular lymphoma in remission, crohn's disease, pneumococcal PNA    PT Comments    Pt reporting moderate back pain, but overall reports doing well with mobility with use of rollator. Pt requires min cues for brace donning/doffing, precautions during mobility. Pt ambulatory for great hallway distance with rollator, requires cues for foot clearance and rollator use. Plan remains appropriate.     Recommendations for follow up therapy are one component of a multi-disciplinary discharge planning process, led by the attending physician.  Recommendations may be updated based on patient status, additional functional criteria and insurance authorization.  Follow Up Recommendations       Assistance Recommended at Discharge Intermittent Supervision/Assistance  Patient can return home with the following A little help with walking and/or transfers;A little help with bathing/dressing/bathroom   Equipment Recommendations  None recommended by PT    Recommendations for Other Services       Precautions / Restrictions Precautions Precautions: Fall;Back Precaution Comments: reviewed back precautions and how to donn/doff brace Required Braces or Orthoses: Spinal Brace Spinal Brace: Thoracolumbosacral orthotic;Applied in sitting position Restrictions Weight Bearing Restrictions: No     Mobility  Bed Mobility Overal bed mobility: Needs Assistance             General bed mobility comments: sitting EOB    Transfers Overall transfer level: Needs assistance Equipment used: Rolling walker (2 wheels) Transfers: Sit to/from Stand Sit to Stand: Supervision            General transfer comment: cues for locking rollator, hand placement when rising/sitting    Ambulation/Gait Ambulation/Gait assistance: Supervision Gait Distance (Feet): 400 Feet Assistive device: Rollator (4 wheels) Gait Pattern/deviations: Step-through pattern, Decreased stride length Gait velocity: decr     General Gait Details: cues for increasing foot clearance, rollator use   Stairs             Wheelchair Mobility    Modified Rankin (Stroke Patients Only)       Balance Overall balance assessment: Needs assistance, History of Falls Sitting-balance support: Feet supported, No upper extremity supported Sitting balance-[name]: Good     Standing balance support: No upper extremity supported Standing balance-[name]: Fair                              Cognition Arousal/Alertness: Awake/alert Behavior During Therapy: Anxious Overall Cognitive Status: Impaired/Different from baseline Area of Impairment: Safety/judgement                         Safety/Judgement: Decreased awareness of deficits, Decreased awareness of safety     General Comments: requires cuing for brace donning/doffing        Exercises      General Comments        Pertinent Vitals/Pain Pain Assessment Pain Assessment: Faces Faces Pain Scale: Hurts little more Pain Location: back Pain Descriptors / Indicators: Sore, Discomfort, Grimacing Pain Intervention(s): Limited activity within patient's tolerance, Monitored during session, Repositioned    Home Living  Prior Function            PT Goals (current goals can now be found in the care plan section) Acute Rehab PT Goals PT Goal Formulation: With patient Time For Goal Achievement: 10/20/22 Potential to Achieve Goals: Good Progress towards PT goals: Progressing toward goals    Frequency    Min 3X/week      PT Plan Current plan remains appropriate     Co-evaluation              AM-PAC PT "6 Clicks" Mobility   Outcome Measure  Help needed turning from your back to your side while in a flat bed without using bedrails?: A Little Help needed moving from lying on your back to sitting on the side of a flat bed without using bedrails?: A Little Help needed moving to and from a bed to a chair (including a wheelchair)?: A Little Help needed standing up from a chair using your arms (Alexander.g., wheelchair or bedside chair)?: A Little Help needed to walk in hospital room?: A Little Help needed climbing 3-5 steps with a railing? : A Little 6 Click Score: 18    End of Session Equipment Utilized During Treatment: Back brace Activity Tolerance: Patient tolerated treatment well;Patient limited by pain Patient left: in bed;with call bell/phone within reach Nurse Communication: Mobility status PT Visit Diagnosis: Other abnormalities of gait and mobility (R26.89)     Time: 1610-9604 PT Time Calculation (min) (ACUTE ONLY): 13 min  Charges:  $Therapeutic Activity: 8-22 mins                     Meghan Alexander, PT DPT Acute Rehabilitation Services Secure Chat Preferred  Office [phone]    Meghan Alexander Meghan Alexander 10/10/2022, 10:21 AM

## 2022-10-10 NOTE — Progress Notes (Signed)
Occupational Therapy Treatment Patient Details Name: Meghan Alexander MRN: [id number] DOB: 04-14-1956 Today's Date: 10/10/2022   History of present illness 67 yo female presents to [facility] on 6/9 with back pain x2 weeks, imaging shows subacute T7-8 compression fx, s/p T7-8 vertebroplasty on 6/12. PMH:  R ankle fx ORIF of ankle, T2DM, HTN, follicular lymphoma in remission, crohn's disease, pneumococcal PNA   OT comments  Pt demonstrating fair progress toward therapy goals. Pt initially hesitant to participate in therapy secondary to nausea and reports of low blood sugar. Pt agreeable to seated EOB activity with encouragement. Pt demonstrating poor recall of back precautions. Pt re-educated on back precautions, brace wearing schedule, and how to don/doff brace with pt verbalizing understanding. Pt donned/doffed brace while seated EOB with min A. Pt simulated LB dressing while alternating sitting/standing with generalized supervision for safety, pt able to achieve figure four position with BLEs. Pt would benefit from continued acute OT services to maximize functional independence and safety to facilitate transition to pt's natural home environment. No further follow up OT services recommended upon discharge.    Recommendations for follow up therapy are one component of a multi-disciplinary discharge planning process, led by the attending physician.  Recommendations may be updated based on patient status, additional functional criteria and insurance authorization.    Assistance Recommended at Discharge Intermittent Supervision/Assistance  Patient can return home with the following  Assistance with cooking/housework;Assist for transportation   Equipment Recommendations  None recommended by OT    Recommendations for Other Services      Precautions / Restrictions Precautions Precautions: Fall;Back Precaution Comments: reviewed back precautions and how to donn/doff brace Required Braces or Orthoses: Spinal  Brace Spinal Brace: Thoracolumbosacral orthotic;Applied in sitting position Restrictions Weight Bearing Restrictions: No       Mobility Bed Mobility Overal bed mobility: Needs Assistance Bed Mobility: Sidelying to Sit, Sit to Sidelying   Sidelying to sit: Supervision     Sit to sidelying: Supervision General bed mobility comments: min cues for log rolling    Transfers Overall transfer level: Needs assistance Equipment used: None Transfers: Sit to/from Stand Sit to Stand: Supervision           General transfer comment: STS transfer from EOB with supervision and no AD, pt deferred further mobility secondary to nausea and reported low blood sugar     Balance Overall balance assessment: Needs assistance, History of Falls Sitting-balance support: Feet supported, No upper extremity supported Sitting balance-[name]: Good Sitting balance - Comments: sitting EOB   Standing balance support: No upper extremity supported Standing balance-[name]: Fair Standing balance comment: standing without BUE supprt                           ADL either performed or assessed with clinical judgement   ADL Overall ADL's : Needs assistance/impaired                 Upper Body Dressing : Minimal assistance Upper Body Dressing Details (indicate cue type and reason): assist to don/doff back brace Lower Body Dressing: Supervision/safety;Sit to/from stand Lower Body Dressing Details (indicate cue type and reason): simulated Toilet Transfer: Supervision/safety;Ambulation Toilet Transfer Details (indicate cue type and reason): simulated         Functional mobility during ADLs: Supervision/safety General ADL Comments: Pt simulated LB dressing while alternating sitting/standing EOB without use of AD. Pt demonstrated ability to acheive figure four position for LB dressing/bathing tasks. Required min cues to  don/doff brace.    Extremity/Trunk Assessment Upper Extremity  Assessment Upper Extremity Assessment: Generalized weakness   Lower Extremity Assessment Lower Extremity Assessment: Defer to PT evaluation        Vision   Vision Assessment?: No apparent visual deficits   Perception Perception Perception: Not tested   Praxis Praxis Praxis: Not tested    Cognition Arousal/Alertness: Awake/alert Behavior During Therapy: Anxious Overall Cognitive Status: Impaired/Different from baseline Area of Impairment: Safety/judgement                         Safety/Judgement: Decreased awareness of deficits, Decreased awareness of safety     General Comments: Pt able to recall 1/3 BLT back precautions, pt re-educated on back precautions and how to don/doff back brace.        Exercises      Shoulder Instructions       General Comments VSS on RA    Pertinent Vitals/ Pain       Pain Assessment Pain Assessment: Faces Faces Pain Scale: Hurts a little bit Pain Location: back Pain Descriptors / Indicators: Discomfort, Guarding Pain Intervention(s): Limited activity within patient's tolerance, Monitored during session  Home Living Family/patient expects to be discharged to:: Private residence                                        Prior Functioning/Environment              Frequency  Min 2X/week        Progress Toward Goals  OT Goals(current goals can now be found in the care plan section)  Progress towards OT goals: Progressing toward goals  Acute Rehab OT Goals Patient Stated Goal: to lay down OT Goal Formulation: With patient Time For Goal Achievement: 10/20/22 Potential to Achieve Goals: Good ADL Goals Pt Will Perform Lower Body Bathing: with modified independence;sit to/from stand;with adaptive equipment Pt Will Perform Lower Body Dressing: with modified independence;sit to/from stand Pt Will Transfer to Toilet: with modified independence;ambulating Pt Will Perform Toileting - Clothing  Manipulation and hygiene: with modified independence;sit to/from stand;sitting/lateral leans Additional ADL Goal #1: Pt will independently adhere to back precuations during ADL task Additional ADL Goal #2: Pt will donn back brace independently  Plan Discharge plan remains appropriate;Frequency remains appropriate    Co-evaluation                 AM-PAC OT "6 Clicks" Daily Activity     Outcome Measure   Help from another person eating meals?: None Help from another person taking care of personal grooming?: A Little Help from another person toileting, which includes using toliet, bedpan, or urinal?: A Little Help from another person bathing (including washing, rinsing, drying)?: A Little Help from another person to put on and taking off regular upper body clothing?: A Little Help from another person to put on and taking off regular lower body clothing?: A Little 6 Click Score: 19    End of Session Equipment Utilized During Treatment: Other (comment) (none)  OT Visit Diagnosis: Pain;Other abnormalities of gait and mobility (R26.89)   Activity Tolerance Patient limited by fatigue   Patient Left in bed;with call bell/phone within reach   Nurse Communication Mobility status        Time: 1610-9604 OT Time Calculation (min): 9 min  Charges: OT General Charges $OT Visit: 1 Visit OT Treatments $  Self Care/Home Management : 8-22 mins  Sherley Bounds, OTS Acute Rehabilitation Services Office [phone] Secure Chat Communication Preferred   Sherley Bounds 10/10/2022, 5:16 PM

## 2022-10-10 NOTE — Progress Notes (Addendum)
Referring Physician(s): Junious Silk, NP   Supervising Physician: Julieanne Cotton  Patient Status:  [facility] - In-pt  Chief Complaint:  Upper back pain, possible T7/T8 compression fx S/p T8 VP by Dr. Corliss Skains on 6/12  - No true compression fx seen in T7 body during the procedure.   Subjective:  Patient laying in bed, NAD. Spouse at bedside.  Reports that her pain in slightly better, she has been trying to wean off IV pain meds.  Main complaint today is nausea, patient states that the nausea medication she was given made her short of breath yesterday.  She ambulates to bathroom without difficulty.  All questions were answered to their satisfaction.    Allergies: Latex, Codeine, Atenolol, Other, Hydrocodone, and Oxycodone  Medications: Prior to Admission medications   Medication Sig Start Date End Date Taking? Authorizing Provider  BENADRYL ALLERGY 25 MG tablet Take 25 mg by mouth at bedtime.   Yes [provider]  CYLTEZO-CD/UC/HS STARTER 40 MG/0.8ML AJKT Inject 160 mLs into the skin every 14 (fourteen) days. Started 09/10/2022. 09/09/22  Yes [provider]  HUMALOG KWIKPEN 100 UNIT/ML KwikPen Inject 5 Units into the skin with breakfast, with lunch, and with evening meal. Patient taking differently: Inject 15 Units into the skin See admin instructions. Inject 15 units into the skin before breakfast and an additional 15 units once a day as needed for elevated BGL 10/11/21  Yes Littie Deeds, MD  insulin detemir (LEVEMIR) 100 UNIT/ML FlexPen Inject 10 Units into the skin daily. Patient taking differently: Inject 20 Units into the skin See admin instructions. Inject 20 units into the skin in the morning as needed for elevated BGL 10/11/21  Yes Littie Deeds, MD  losartan (COZAAR) 50 MG tablet Take 25 mg by mouth in the morning.   Yes [provider]  Melatonin 10 MG TABS Take 10 mg by mouth at bedtime.   Yes [provider]  metFORMIN (GLUCOPHAGE)  500 MG tablet Take 500 mg by mouth daily with breakfast. 07/23/19  Yes [provider]  metoprolol succinate (TOPROL-XL) 25 MG 24 hr tablet Take 1 tablet (25 mg total) by mouth daily. 10/12/21  Yes Littie Deeds, MD  Multiple Vitamin (MULTIVITAMIN) tablet Take 1 tablet by mouth daily with breakfast.   Yes [provider]  omeprazole (PRILOSEC) 20 MG capsule Take 20 mg by mouth daily before breakfast. 11/28/20  Yes [provider]  ondansetron (ZOFRAN) 4 MG tablet Take 1 tablet (4 mg total) by mouth every 8 (eight) hours as needed for nausea or vomiting. 10/03/22  Yes Tilden Fossa, MD  oxyCODONE (ROXICODONE) 5 MG immediate release tablet Take 1 tablet (5 mg total) by mouth every 6 (six) hours as needed for up to 10 doses for severe pain. 09/26/22  Yes Mardene Sayer, MD  rosuvastatin (CRESTOR) 10 MG tablet Take 10 mg by mouth daily.   Yes [provider]  saccharomyces boulardii (FLORASTOR) 250 MG capsule Take 250 mg by mouth in the morning.   Yes [provider]  TURMERIC PO Take 1 capsule by mouth at bedtime.   Yes [provider]  venlafaxine XR (EFFEXOR-XR) 37.5 MG 24 hr capsule Take 37.5 mg by mouth daily with breakfast.   Yes [provider]  Accu-Chek Softclix Lancets lancets Use to test blood sugars up to 4 times daily as directed. 08/09/21   Drema Dallas, MD  Blood Glucose Monitoring Suppl (BLOOD GLUCOSE MONITOR SYSTEM) w/Device KIT Use to test blood  sugars up to 4 times daily. 08/09/21   Drema Dallas, MD  Continuous Blood Gluc Sensor (FREESTYLE LIBRE 2 SENSOR) MISC Inject 1 Device into the skin every 14 (fourteen) days.    [provider]  diltiazem (CARDIZEM CD) 240 MG 24 hr capsule Take 1 capsule (240 mg total) by mouth daily. Patient not taking: Reported on 10/05/2022 10/11/21   Littie Deeds, MD  glucose blood (ACCU-CHEK GUIDE) test strip Use to test blood sugars up to 4 times daily as directed. 08/09/21   Drema Dallas, MD  Insulin Pen Needle 32G X 4 MM MISC Use to inject insulin up to 4 times daily as directed. 08/09/21   Drema Dallas, MD  lidocaine (LIDODERM) 5 % Place 1 patch onto the skin daily. Remove & Discard patch within 12 hours or as directed by MD Patient not taking: Reported on 10/05/2022 09/26/22   Mardene Sayer, MD  predniSONE (DELTASONE) 10 MG tablet Take 20 mg by mouth daily. Patient not taking: Reported on 10/05/2022 09/07/22   [provider]     Vital Signs: BP (!) 149/98 (BP Location: Right Arm)   Pulse (!) 109   Temp 98 F (36.7 C)   Resp 18   Ht 5\' 2"  (1.575 m)   Wt 176 lb 5.9 oz (80 kg)   SpO2 98%   BMI 32.26 kg/m   Physical Exam Vitals reviewed.  Constitutional:      General: She is not in acute distress.    Appearance: She is not ill-appearing.  Pulmonary:     Effort: Pulmonary effort is normal.  Musculoskeletal:     Cervical back: Neck supple.  Skin:    General: Skin is warm and dry.     Coloration: Skin is not jaundiced or pale.  Neurological:     Mental Status: She is alert and oriented to person, place, and time.  Psychiatric:        Mood and Affect: Mood normal.        Behavior: Behavior normal.     Imaging: MR THORACIC SPINE WO CONTRAST  Result Date: 10/09/2022 CLINICAL DATA:  Thoracic compression fracture EXAM: MRI THORACIC SPINE WITHOUT CONTRAST TECHNIQUE: Multiplanar, multisequence MR imaging of the thoracic spine was performed. No intravenous contrast was administered. COMPARISON:  10/03/2022 FINDINGS: Evaluation is somewhat limited by motion artifact. Alignment: Unchanged S-shaped curvature of the thoracolumbar spine. Redemonstrated exaggeration of the normal thoracic kyphosis. Unchanged trace anterolisthesis of T2 on T3 and T3 on T4. Vertebrae: Redemonstrated compression fracture at T8, status post interval kyphoplasty. 60% vertebral body height loss and 2 mm retropulsion of the posteroinferior cortex, unchanged. Unchanged  appearance of the inferior endplate of T7, with some edema and minimal height loss, favored to represent a subacute fracture, unchanged. Chronic compression deformity of T12, unchanged. No acute fracture, suspicious osseous lesion, or evidence of discitis. Cord: Normal signal and morphology. Mildly increased signal in the spinal cord at T8 on the sagittal STIR sequence does not have a correlate on the sagittal T2 or axial T2, and is favored to be artifactual. Paraspinal and other soft tissues: Fluid-filled patulous esophagus. Otherwise negative. Disc levels: Multilevel facet arthropathy and degenerative disc disease,, which is described in detail on the prior exam. T8-T9; aforementioned retropulsion of the posteroinferior aspect of T8, facet arthropathy, and epidural lipomatosis contribute to mild thecal sac narrowing. Mild to moderate bilateral neural foraminal narrowing. No other spinal canal stenosis. Redemonstrated mild right neural foraminal narrowing at T2-T3, T3-T4, T4-T5,  and T5-T6. IMPRESSION: 1. Evaluation is somewhat limited by motion artifact. Within this limitation, status post interval kyphoplasty at T8, with unchanged 60% vertebral body height loss and 2 mm retropulsion of the posteroinferior cortex. Unchanged mild thecal sac narrowing at T8-T9. Mild to moderate bilateral neural foraminal narrowing at this level. No definite cord signal abnormality is seen. 2. Unchanged appearance of the inferior endplate of T7, with some edema and minimal height loss, favored to represent a subacute fracture. 3. Unchanged chronic compression deformity of T12. Electronically Signed   By: Wiliam Ke M.D.   On: 10/09/2022 20:20   VAS [place] LOWER EXTREMITY VENOUS (DVT)  Result Date: 10/06/2022  Lower Venous DVT Study Patient Name:  Meghan Alexander  Date of Exam:   10/06/2022 Medical Rec #: [id number]       Accession #:    [id number] Date of Birth: 04/02/1956        Patient Gender: F Patient Age:   48 years Exam Location:   [facility] Procedure:      VAS [place] LOWER EXTREMITY VENOUS (DVT) Referring Phys: Junious Silk --------------------------------------------------------------------------------  Indications: Edema.  Risk Factors: Cancer Lymphoma Chrohn's disease,. Comparison Study: Previous exam on 09/08/21 was negative for DVT Performing Technologist: Ernestene Mention RVT, RDMS  Examination Guidelines: A complete evaluation includes B-mode imaging, spectral Doppler, color Doppler, and power Doppler as needed of all accessible portions of each vessel. Bilateral testing is considered an integral part of a complete examination. Limited examinations for reoccurring indications may be performed as noted. The reflux portion of the exam is performed with the patient in reverse Trendelenburg.  +---------+---------------+---------+-----------+----------+-------------------+ RIGHT    CompressibilityPhasicitySpontaneityPropertiesThrombus Aging      +---------+---------------+---------+-----------+----------+-------------------+ CFV      Full           Yes      Yes                                      +---------+---------------+---------+-----------+----------+-------------------+ SFJ      Full                                                             +---------+---------------+---------+-----------+----------+-------------------+ FV Prox  Full           Yes      Yes                                      +---------+---------------+---------+-----------+----------+-------------------+ FV Mid   Full           Yes      Yes                                      +---------+---------------+---------+-----------+----------+-------------------+ FV DistalFull           Yes      Yes                                      +---------+---------------+---------+-----------+----------+-------------------+ PFV  Full                                                              +---------+---------------+---------+-----------+----------+-------------------+ POP      Full           Yes      Yes                                      +---------+---------------+---------+-----------+----------+-------------------+ PTV      Full                                                             +---------+---------------+---------+-----------+----------+-------------------+ PERO     Full                                         Not well visualized +---------+---------------+---------+-----------+----------+-------------------+   +---------+---------------+---------+-----------+----------+-------------------+ LEFT     CompressibilityPhasicitySpontaneityPropertiesThrombus Aging      +---------+---------------+---------+-----------+----------+-------------------+ CFV      Full           Yes      Yes                                      +---------+---------------+---------+-----------+----------+-------------------+ SFJ      Full                                                             +---------+---------------+---------+-----------+----------+-------------------+ FV Prox  Full           Yes      Yes                                      +---------+---------------+---------+-----------+----------+-------------------+ FV Mid   Full           Yes      Yes                                      +---------+---------------+---------+-----------+----------+-------------------+ FV DistalFull           Yes      Yes                                      +---------+---------------+---------+-----------+----------+-------------------+ PFV      Full                                                             +---------+---------------+---------+-----------+----------+-------------------+  POP      Full           Yes      Yes                                      +---------+---------------+---------+-----------+----------+-------------------+  PTV      Full                                                             +---------+---------------+---------+-----------+----------+-------------------+ PERO     Full                                         Not well visualized +---------+---------------+---------+-----------+----------+-------------------+     Summary: BILATERAL: - No evidence of deep vein thrombosis seen in the lower extremities, bilaterally. - RIGHT: - A large cystic structure with internal calcifications is found in the popliteal fossa (6.8 x 1.0 x 4.8 cm).  LEFT: - No cystic structure found in the popliteal fossa.  *See table(s) above for measurements and observations. Electronically signed by Sherald Hess MD on 10/06/2022 at 1:24:52 PM.    Final    ECHOCARDIOGRAM COMPLETE  Result Date: 10/06/2022    ECHOCARDIOGRAM REPORT   Patient Name:   Meghan Alexander Date of Exam: 10/06/2022 Medical Rec #:  [id number]      Height:       62.0 in Accession #:    [id number]     Weight:       176.4 lb Date of Birth:  09-May-1955       BSA:          1.812 m Patient Age:    67 years       BP:           162/96 mmHg Patient Gender: F              HR:           101 bpm. Exam Location:  Inpatient Procedure: 2D Echo, Cardiac Doppler, Color Doppler and Intracardiac            Opacification Agent Indications:    Edema  History:        Patient has prior history of Echocardiogram examinations, most                 recent 10/08/2021. Arrythmias:Atrial Fibrillation; Risk                 Factors:Diabetes and Hypertension. ILD, lymphoma.  Sonographer:    Milda Smart Referring Phys: [id number] ALLISON L ELLIS  Sonographer Comments: Image acquisition challenging due to patient body habitus and Image acquisition challenging due to respiratory motion. IMPRESSIONS  1. Left ventricular ejection fraction, by estimation, is 55 to 60%. The left ventricle has normal function. The left ventricle has no regional wall motion abnormalities. Indeterminate diastolic filling  due to E-A fusion.  2. Right ventricular systolic function is normal. The right ventricular size is normal.  3. The mitral valve is normal in structure. Mild mitral valve regurgitation. No evidence of mitral stenosis.  4. The aortic valve is normal in structure. Aortic valve regurgitation  is not visualized. No aortic stenosis is present.  5. The inferior vena cava is normal in size with greater than 50% respiratory variability, suggesting right atrial pressure of 3 mmHg. FINDINGS  Left Ventricle: Left ventricular ejection fraction, by estimation, is 55 to 60%. The left ventricle has normal function. The left ventricle has no regional wall motion abnormalities. The left ventricular internal cavity size was normal in size. There is  no left ventricular hypertrophy. Indeterminate diastolic filling due to E-A fusion. Right Ventricle: The right ventricular size is normal. No increase in right ventricular wall thickness. Right ventricular systolic function is normal. Left Atrium: Left atrial size was normal in size. Right Atrium: Right atrial size was normal in size. Pericardium: There is no evidence of pericardial effusion. Mitral Valve: The mitral valve is normal in structure. There is mild calcification of the mitral valve leaflet(s). Mild mitral valve regurgitation. No evidence of mitral valve stenosis. Tricuspid Valve: The tricuspid valve is normal in structure. Tricuspid valve regurgitation is not demonstrated. No evidence of tricuspid stenosis. Aortic Valve: The aortic valve is normal in structure. Aortic valve regurgitation is not visualized. No aortic stenosis is present. Pulmonic Valve: The pulmonic valve was not well visualized. Pulmonic valve regurgitation is not visualized. No evidence of pulmonic stenosis. Aorta: The aortic root is normal in size and structure. Venous: The inferior vena cava is normal in size with greater than 50% respiratory variability, suggesting right atrial pressure of 3 mmHg.  IAS/Shunts: No atrial level shunt detected by color flow Doppler.  LEFT VENTRICLE PLAX 2D LVIDd:         4.10 cm     Diastology LVIDs:         3.20 cm     LV e' medial:    5.11 cm/s LV PW:         0.90 cm     LV E/e' medial:  22.1 LV IVS:        0.70 cm     LV e' lateral:   7.29 cm/s LVOT diam:     2.30 cm     LV E/e' lateral: 15.5 LV SV:         70 LV SV Index:   39 LVOT Area:     4.15 cm  LV Volumes (MOD) LV vol d, MOD A2C: 86.1 ml LV vol d, MOD A4C: 84.8 ml LV vol s, MOD A2C: 39.0 ml LV vol s, MOD A4C: 38.6 ml LV SV MOD A2C:     47.1 ml LV SV MOD A4C:     84.8 ml LV SV MOD BP:      46.5 ml RIGHT VENTRICLE RV S prime:     9.25 cm/s TAPSE (M-mode): 1.3 cm LEFT ATRIUM             Index        RIGHT ATRIUM          Index LA diam:        3.80 cm 2.10 cm/m   RA Area:     7.52 cm LA Vol (A2C):   27.8 ml 15.34 ml/m  RA Volume:   12.90 ml 7.12 ml/m LA Vol (A4C):   29.3 ml 16.17 ml/m LA Biplane Vol: 30.4 ml 16.78 ml/m  AORTIC VALVE LVOT Vmax:   89.40 cm/s LVOT Vmean:  71.800 cm/s LVOT VTI:    0.168 m  AORTA Ao Root diam: 3.00 cm Ao Asc diam:  3.70 cm MITRAL VALVE MV Area (PHT): 4.80 cm  SHUNTS MV Decel Time: 158 msec     Systemic VTI:  0.17 m MR Peak grad: 89.1 mmHg     Systemic Diam: 2.30 cm MR Vmax:      472.00 cm/s MV E velocity: 113.00 cm/s Arvilla Meres MD Electronically signed by Arvilla Meres MD Signature Date/Time: 10/06/2022/11:19:14 AM    Final     Labs:  CBC: Recent Labs    10/04/22 1948 10/05/22 0003 10/05/22 0934 10/06/22 0412 10/09/22 0508  WBC 4.8  --  4.2 3.6* 4.7  HGB 11.3* 9.5* 11.1* 9.9* 11.3*  HCT 36.3 28.0* 35.3* 32.4* 35.1*  PLT 236  --  231 244 279     COAGS: Recent Labs    10/05/22 1224  INR 1.1     BMP: Recent Labs    10/06/22 0412 10/07/22 0503 10/09/22 0508 10/10/22 0448  NA 139 141 139 137  K 3.2* 3.8 3.3* 3.5  CL 104 102 98 97*  CO2 26 28 30 29   GLUCOSE 127* 104* 126* 156*  BUN 6* 5* 8 12  CALCIUM 8.3* 8.8* 9.2 9.3  CREATININE 1.08* 1.05*  0.94 0.99  GFRNONAA 56* 58* >60 >60     LIVER FUNCTION TESTS: Recent Labs    07/31/22 0529 09/26/22 0859 10/03/22 0019 10/04/22 1948  BILITOT 0.4 0.4 0.5 0.8  AST 40 37 28 28  ALT 56* 60* 32 31  ALKPHOS 78 119 80 81  PROT 5.5* 6.5 5.3* 5.8*  ALBUMIN 3.4* 4.3 3.2* 3.4*     Assessment and Plan:  67 y.o. female with upper back pain  s/p T8 VP by Dr. Corliss Skains on 6/12.   Patient reported persistent pain over the weekend, MR thoracic ordered, no new fx or other acute findings.  Patient has not needed IV Dilaudid since 3 pm yesterday, remains on Lidocaine patch and Tramadol 100 mg 8 hr PRN.   Patient was encouraged to inform NIR if she develops new, acute upper back pain.  She was informed not to bend, lift, or stoop more than 10 pounds until 10/19/22 (2 weeks from the VP on 6/12.)  Further treatment plan per [facility] team.  Appreciate and agree with the plan.  Please call NIR for questions and concerns.    Electronically Signed: Willette Brace, PA-C 10/10/2022, 8:37 AM   I spent a total of 15 Minutes at the the patient's bedside AND on the patient's hospital floor or unit, greater than 50% of which was counseling/coordinating care for s/p T8 VP.   This chart was dictated using voice recognition software.  Despite best efforts to proofread,  errors can occur which can change the documentation meaning.

## 2022-10-10 NOTE — Progress Notes (Signed)
OT Cancellation Note  Patient Details Name: Meghan Alexander MRN: [id number] DOB: 01/25/1956   Cancelled Treatment:    Reason Eval/Treat Not Completed: Patient declined, no reason specified (Pt declined upon arrival stating, "I'm just not into it." Pt frustrated that staff keeps interrupting her daily nap. Education provided on importance of OT, pt continued to decline. Will follow up as able.)  Donia Pounds 10/10/2022, 12:18 PM

## 2022-10-10 NOTE — Progress Notes (Addendum)
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  Workup in the emergency department showed subacute T8 compression fracture.  Lab also showed AKI, hypokalemia.  Also was complaining of nausea and vomiting.  IR was consulted for compression fracture, underwent vertebroplasty on 6/12.  Consulted PT/OT.  Continued back pain prolonging hospital course, plan for discharge to home with home health after improvement in the back pain.  Assessment & Plan:  Principal Problem:   Intractable nausea and vomiting  Known T8 compression fracture/back pain: No focal neurological deficits.  Patient was taking oral Dilaudid at home.   PT/OT consulted.  Recommend home health.  IR consulted and underwent vertebroplasty on 6/12.  Apply TLSO brace when out of bed.  Current issue is the pain.  On IV Dilaudid as needed. Changed oxycodone to Toradol today.  Also ordered Robaxin and scheduled dose.She feels better today but she does not feel ready to go home.  MRI of the thoracic spine done on 6/16 showed status post kyphoplasty for T8 vertebrae, 60% height loss, subacute T7 fracture, chronic T12 fracture  Intractable nausea/vomiting: Nausea and vomiting have been better.  Initially suspected to be from pain medications.  Also has history of achalasia.  She denies any problem with swallowing. CT imaging showed  patulous, fluid-filled esophagus.  Speech therapy signed off ,patient has history of achalasia and follows with [facility], Dr. Dulce Sellar.  She has not appointment coming soon.  She is tolerating diet.  AKI: Baseline creatinine normal.  Presented with creatinine in the range of 1.3.   Resolved with IV fluids  Hypokalemia/hypomagnesemia: Currently being  monitored and supplemented as needed  Bilateral lower extremity edema: Ongoing for months.  Echo shows EF of 55 to 60%, indeterminate diastolic filling.  Given her dose of Lasix 40 mg once.  Venous Doppler pending showed cystic structure on the right popliteal fossa.  No deep vein thrombosis  Diabetes type 2: On metformin at home.  Currently on sliding scale.  Monitor blood sugars  Hypertension: Hypertensive on presentation.  Monitor blood pressure, continue current medications.  Meds dose adjusted, increased the dose of metoprolol and losartan  Crohns disease: Recently diagnosed.  Patient was started on Cyltezo-CD recently.  No history of hematochezia  History of follicular lymphoma: Follows with Dr. Candise Che as an outpatient.  On chronic steroid therapy.  Lung nodule: CT imaging showed stable left upper lobe nodules measuring up to 9 mm.we recommend follow-up imaging in 6-12 months to assess stability.  Constipation: Continue bowel regimen       DVT prophylaxis:enoxaparin (LOVENOX) injection 40 mg Start: 10/05/22 1000     Code Status: Full Code  Family Communication: None at bedside  Patient status:Inpatient  Patient is from :Home  Anticipated discharge YQ:MVHQ  Estimated DC date:1-2 days,when back pain is better   Consultants: IR  Procedures:None  Antimicrobials:  Anti-infectives (From admission, onward)    Start     Dose/Rate Route Frequency Ordered Stop   10/05/22 1452  ceFAZolin (ANCEF) IVPB 2g/100 mL premix        over 30 Minutes Intravenous Continuous PRN 10/05/22 1518 10/05/22 1452   10/05/22 0045  cefTRIAXone (ROCEPHIN) 1 g in sodium chloride 0.9 % 100 mL IVPB  1 g 200 mL/hr over 30 Minutes Intravenous  Once 10/05/22 0044 10/05/22 0225       Subjective: Patient seen and examined at bedside today.  Hemodynamically stable.  Looks more comfortable  today and says that her back pain  is better.  She states she needs to figure out what pain medications are good for her before she decides to go home.  Objective: Vitals:   10/09/22 1630 10/09/22 2017 10/10/22 0442 10/10/22 0829  BP: (!) 151/84 (!) 160/92 (!) 166/94 (!) 149/98  Pulse: 93 89 (!) 103 (!) 109  Resp: 17 18 18 18   Temp: 98.3 F (36.8 C) 98.2 F (36.8 C) 98 F (36.7 C) 98 F (36.7 C)  TempSrc: Oral     SpO2: 99% 99% 98% 98%  Weight:      Height:       No intake or output data in the 24 hours ending 10/10/22 1101   Filed Weights   10/04/22 2204  Weight: 80 kg    Examination:  General exam: Overall comfortable, not in distress,obese HEENT: PERRL Respiratory system:  no wheezes or crackles  Cardiovascular system: S1 & S2 heard, RRR.  Gastrointestinal system: Abdomen is nondistended, soft and nontender. Central nervous system: Alert and oriented Extremities: No edema, no clubbing ,no cyanosis Skin: No rashes, no ulcers,no icterus     Data Reviewed: I have personally reviewed following labs and imaging studies  CBC: Recent Labs  Lab 10/04/22 1948 10/05/22 0003 10/05/22 0934 10/06/22 0412 10/09/22 0508  WBC 4.8  --  4.2 3.6* 4.7  NEUTROABS 2.8  --   --   --   --   HGB 11.3* 9.5* 11.1* 9.9* 11.3*  HCT 36.3 28.0* 35.3* 32.4* 35.1*  MCV 84.6  --  86.7 85.5 81.4  PLT 236  --  231 244 279   Basic Metabolic Panel: Recent Labs  Lab 10/04/22 1948 10/05/22 0003 10/05/22 0934 10/05/22 1359 10/06/22 0412 10/07/22 0503 10/09/22 0508 10/10/22 0448  NA 139   < >  --  143 139 141 139 137  K 3.2*   < >  --  3.1* 3.2* 3.8 3.3* 3.5  CL 97*  --   --  100 104 102 98 97*  CO2 26  --   --  27 26 28 30 29   GLUCOSE 237*  --   --  78 127* 104* 126* 156*  BUN 8  --   --  6* 6* 5* 8 12  CREATININE 1.31*  --  1.06* 1.03* 1.08* 1.05* 0.94 0.99  CALCIUM 9.1  --   --  9.2 8.3* 8.8* 9.2 9.3  MG 1.3*  --  1.8 1.8 1.6* 2.0  --   --   PHOS  --   --  4.1 3.5  --   --   --   --    < > = values in this  interval not displayed.     Recent Results (from the past 240 hour(s))  SARS Coronavirus 2 by RT PCR (hospital order, performed in [facility] hospital lab) *cepheid single result test* Anterior Nasal Swab     Status: None   Collection Time: 10/04/22 10:21 PM   Specimen: Anterior Nasal Swab  Result Value Ref Range Status   SARS Coronavirus 2 by RT PCR NEGATIVE NEGATIVE Final    Comment: Performed at [facility], [address], [place], [place] [address]     Radiology Studies: MR THORACIC SPINE WO CONTRAST  Result Date: 10/09/2022 CLINICAL DATA:  Thoracic compression fracture EXAM: MRI THORACIC SPINE WITHOUT CONTRAST TECHNIQUE: Multiplanar, multisequence MR imaging of the thoracic spine was performed. No intravenous contrast was administered. COMPARISON:  10/03/2022 FINDINGS: Evaluation is somewhat limited by motion artifact. Alignment: Unchanged S-shaped curvature of the thoracolumbar spine. Redemonstrated exaggeration of the normal thoracic kyphosis. Unchanged trace anterolisthesis of T2 on T3 and T3 on T4. Vertebrae: Redemonstrated compression fracture at T8, status post interval kyphoplasty. 60% vertebral body height loss and 2 mm retropulsion of the posteroinferior cortex, unchanged. Unchanged appearance of the inferior endplate of T7, with some edema and minimal height loss, favored to represent a subacute fracture, unchanged. Chronic compression deformity of T12, unchanged. No acute fracture, suspicious osseous lesion, or evidence of discitis. Cord: Normal signal and morphology. Mildly increased signal in the spinal cord at T8 on the sagittal STIR sequence does not have a correlate on the sagittal T2 or axial T2, and is favored to be artifactual. Paraspinal and other soft tissues: Fluid-filled patulous esophagus. Otherwise negative. Disc levels: Multilevel facet arthropathy and degenerative disc disease,, which is described in detail on the prior exam. T8-T9; aforementioned retropulsion  of the posteroinferior aspect of T8, facet arthropathy, and epidural lipomatosis contribute to mild thecal sac narrowing. Mild to moderate bilateral neural foraminal narrowing. No other spinal canal stenosis. Redemonstrated mild right neural foraminal narrowing at T2-T3, T3-T4, T4-T5, and T5-T6. IMPRESSION: 1. Evaluation is somewhat limited by motion artifact. Within this limitation, status post interval kyphoplasty at T8, with unchanged 60% vertebral body height loss and 2 mm retropulsion of the posteroinferior cortex. Unchanged mild thecal sac narrowing at T8-T9. Mild to moderate bilateral neural foraminal narrowing at this level. No definite cord signal abnormality is seen. 2. Unchanged appearance of the inferior endplate of T7, with some edema and minimal height loss, favored to represent a subacute fracture. 3. Unchanged chronic compression deformity of T12. Electronically Signed   By: Wiliam Ke M.D.   On: 10/09/2022 20:20    Scheduled Meds:  enoxaparin (LOVENOX) injection  40 mg Subcutaneous Q24H   insulin aspart  0-9 Units Subcutaneous Q12H   lidocaine  1 patch Transdermal Q24H   losartan  50 mg Oral Daily   methocarbamol  500 mg Oral TID   metoprolol succinate  100 mg Oral Daily   pantoprazole  40 mg Oral BID   polyethylene glycol  17 g Oral Daily   senna-docusate  1 tablet Oral BID   sodium chloride flush  3 mL Intravenous Q12H   venlafaxine XR  37.5 mg Oral Q breakfast   Continuous Infusions:     LOS: 5 days   Burnadette Pop, MD [facility] P6/17/2024, 11:01 AM

## 2022-10-11 ENCOUNTER — Other Ambulatory Visit (HOSPITAL_COMMUNITY): Payer: Self-pay

## 2022-10-11 ENCOUNTER — Encounter: Payer: Self-pay | Admitting: Hematology

## 2022-10-11 DIAGNOSIS — R112 Nausea with vomiting, unspecified: Secondary | ICD-10-CM | POA: Diagnosis not present

## 2022-10-11 LAB — GLUCOSE, CAPILLARY
Glucose-Capillary: 167 mg/dL — ABNORMAL HIGH (ref 70–99)
Glucose-Capillary: 200 mg/dL — ABNORMAL HIGH (ref 70–99)

## 2022-10-11 MED ORDER — LIDOCAINE 5 % EX PTCH
1.0000 | MEDICATED_PATCH | CUTANEOUS | 0 refills | Status: DC
  Filled 2022-10-11: qty 30, 30d supply, fill #0

## 2022-10-11 MED ORDER — PANTOPRAZOLE SODIUM 40 MG PO TBEC
40.0000 mg | DELAYED_RELEASE_TABLET | Freq: Two times a day (BID) | ORAL | 0 refills | Status: DC
  Filled 2022-10-11: qty 60, 30d supply, fill #0

## 2022-10-11 MED ORDER — POLYETHYLENE GLYCOL 3350 17 GM/SCOOP PO POWD
17.0000 g | Freq: Every day | ORAL | 0 refills | Status: DC
  Filled 2022-10-11: qty 238, 14d supply, fill #0

## 2022-10-11 MED ORDER — TRAMADOL HCL 50 MG PO TABS
100.0000 mg | ORAL_TABLET | Freq: Three times a day (TID) | ORAL | 0 refills | Status: DC | PRN
  Filled 2022-10-11: qty 20, 4d supply, fill #0

## 2022-10-11 MED ORDER — LOSARTAN POTASSIUM 50 MG PO TABS
50.0000 mg | ORAL_TABLET | Freq: Every day | ORAL | 0 refills | Status: DC
  Filled 2022-10-11: qty 30, 30d supply, fill #0

## 2022-10-11 MED ORDER — METOPROLOL SUCCINATE ER 100 MG PO TB24
100.0000 mg | ORAL_TABLET | Freq: Every day | ORAL | 0 refills | Status: DC
  Filled 2022-10-11: qty 30, 30d supply, fill #0

## 2022-10-11 NOTE — Discharge Summary (Signed)
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 Workup in the emergency department showed subacute T8 compression fracture. Lab also showed AKI, hypokalemia. Also was complaining of nausea and vomiting. IR was consulted for compression fracture, underwent vertebroplasty on 6/12. Consulted PT/OT. Persistent back pain prolonged hospital course.  Finally back pain is better today.  She wants to go home.  Medically stable for discharge.  Following problems were addressed during the hospitalization: Known T8 compression fracture/back pain: No focal neurological deficits.  Patient was taking oral Dilaudid at home.   PT/OT consulted.  Recommend home health.  IR consulted and underwent vertebroplasty on 6/12.  Apply TLSO brace when out of bed.   MRI of the thoracic spine done on 6/16 showed status post kyphoplasty for T8 vertebrae, 60% height loss, subacute T7 fracture, chronic T12 fracture.  Back pain better with tramadol, lidocaine patch   Intractable nausea/vomiting: Nausea and vomiting have been better.  Initially suspected to be from pain medications.  Also has history of achalasia.  She denies any problem with swallowing. CT imaging showed  patulous, fluid-filled esophagus.  Speech therapy signed off ,patient has history of achalasia and follows with [facility], Dr. Dulce Sellar.  She has not appointment coming soon.  She is tolerating diet.   AKI: Baseline creatinine normal.  Presented  with creatinine in the range of 1.3.  Resolved with IV fluids    Bilateral lower extremity edema: Ongoing for months.  Echo shows EF of 55 to 60%, indeterminate diastolic filling.  Given her dose of Lasix 40 mg once.  Venous Doppler pending showed cystic structure on the right popliteal fossa.  No deep vein thrombosis   Diabetes type 2: On metformin at home.     Hypertension: Hypertensive on presentation.  Monitor blood pressure, continue current medications.  Meds dose adjusted, increased the dose of metoprolol and losartan   Crohns disease: Recently diagnosed.  Patient was started on Cyltezo-CD recently.  No history of hematochezia   History of follicular lymphoma: Follows with Dr. Candise Che as an outpatient.  On chronic steroid therapy.   Lung nodule: CT imaging showed stable left upper lobe nodules measuring up to 9 mm.we recommend follow-up imaging in 6-12 months to assess stability.   Constipation: Continue bowel regimen    Discharge Diagnoses:  Principal Problem:   Intractable nausea and vomiting    Discharge Instructions  Discharge Instructions     Diet - low sodium heart healthy   Complete by: As directed    Discharge instructions   Complete by: As directed    1)Please take prescribed medications as instructed 2)Follow up with your PCP in a week.  Monitor blood pressure at home 3)Follow up with interventional radiology as an outpatient 4)Follow up with your gastroenterologist.   Increase activity slowly   Complete by: As directed    No wound care   Complete by: As directed       Allergies as of 10/11/2022       Reactions   Latex Other (See Comments)   Blisters / skin peeling   Codeine Nausea And Vomiting, Other (  See Comments)   Hallucinations also   Atenolol Cough   Other Itching, Swelling, Other (See Comments)   DermaPlast; Used after surgery - caused swelling, itching, and wound broke open   Hydrocodone Nausea And Vomiting   Oxycodone Nausea And Vomiting         Medication List     STOP taking these medications    omeprazole 20 MG capsule Commonly known as: PRILOSEC   oxyCODONE 5 MG immediate release tablet Commonly known as: Roxicodone   predniSONE 10 MG tablet Commonly known as: DELTASONE       TAKE these medications    Accu-Chek Guide test strip Generic drug: glucose blood Use to test blood sugars up to 4 times daily as directed.   Accu-Chek Guide w/Device Kit Use to test blood sugars up to 4 times daily.   Accu-Chek Softclix Lancets lancets Use to test blood sugars up to 4 times daily as directed.   Benadryl Allergy 25 MG tablet Generic drug: diphenhydrAMINE Take 25 mg by mouth at bedtime.   Cyltezo-CD/UC/HS Starter 40 MG/0.8ML Ajkt Generic drug: Adalimumab-adbm Inject 160 mLs into the skin every 14 (fourteen) days. Started 09/10/2022.   Florastor 250 MG capsule Generic drug: saccharomyces boulardii Take 250 mg by mouth in the morning.   FreeStyle Libre 2 Sensor Misc Inject 1 Device into the skin every 14 (fourteen) days.   HumaLOG KwikPen 100 UNIT/ML KwikPen Generic drug: insulin lispro Inject 5 Units into the skin with breakfast, with lunch, and with evening meal. What changed:  how much to take when to take this additional instructions   insulin detemir 100 UNIT/ML FlexPen Commonly known as: LEVEMIR Inject 10 Units into the skin daily. What changed:  how much to take when to take this additional instructions   lidocaine 5 % Commonly known as: LIDODERM Place 1 patch onto the skin daily. Remove & Discard patch within 12 hours or as directed by MD Start taking on: October 12, 2022   losartan 50 MG tablet Commonly known as: COZAAR Take 1 tablet (50 mg total) by mouth daily. Start taking on: October 12, 2022 What changed:  how much to take when to take this   Melatonin 10 MG Tabs Take 10 mg by mouth at bedtime.   metFORMIN 500 MG tablet Commonly known as: GLUCOPHAGE Take 500 mg by mouth  daily with breakfast.   metoprolol succinate 100 MG 24 hr tablet Commonly known as: TOPROL-XL Take 1 tablet (100 mg total) by mouth daily. Take with or immediately following a meal. Start taking on: October 12, 2022 What changed:  medication strength how much to take additional instructions   multivitamin tablet Take 1 tablet by mouth daily with breakfast.   ondansetron 4 MG tablet Commonly known as: ZOFRAN Take 1 tablet (4 mg total) by mouth every 8 (eight) hours as needed for nausea or vomiting.   pantoprazole 40 MG tablet Commonly known as: PROTONIX Take 1 tablet (40 mg total) by mouth 2 (two) times daily.   Pentips 32G X 4 MM Misc Generic drug: Insulin Pen Needle Use to inject insulin up to 4 times daily as directed.   polyethylene glycol 17 g packet Commonly known as: MIRALAX / GLYCOLAX Take 17 g by mouth daily. Start taking on: October 12, 2022   rosuvastatin 10 MG tablet Commonly known as: CRESTOR Take 10 mg by mouth daily.   traMADol 50 MG tablet Commonly known as: ULTRAM Take 2 tablets (100 mg total) by mouth every 8 (eight)  hours as needed for moderate pain.   TURMERIC PO Take 1 capsule by mouth at bedtime.   venlafaxine XR 37.5 MG 24 hr capsule Commonly known as: EFFEXOR-XR Take 37.5 mg by mouth daily with breakfast.               Durable Medical Equipment  (From admission, onward)           Start     Ordered   10/06/22 0852  For home use only DME Walker rolling  Once       Question Answer Comment  Walker: With 5 Inch Wheels   Patient needs a walker to treat with the following condition Generalized weakness      10/06/22 0981            Follow-up Information     Burton Apley, MD. Schedule an appointment as soon as possible for a visit.   Specialty: Internal Medicine Why: Call the office and schedule a hospital follow up in the next 7-10 days. Contact information: [address] [place] [address] [phone]          [facility] Follow up.   Specialty: Home Health Services Why: [facility] will provide home health physical therapy.  They will call you to set up services by Monday, 10/10/22. Contact information: [address] [place] [address] [phone]         Julieanne Cotton, MD Follow up.   Specialties: Interventional Radiology, Radiology Why: If symptoms worsen,  please call [facility] at [phone]. Contact information: [address] [place] [address] [phone]                Allergies  Allergen Reactions   Latex Other (See Comments)    Blisters / skin peeling   Codeine Nausea And Vomiting and Other (See Comments)    Hallucinations also   Atenolol Cough   Other Itching, Swelling and Other (See Comments)    DermaPlast; Used after surgery - caused swelling, itching, and wound broke open   Hydrocodone Nausea And Vomiting   Oxycodone Nausea And Vomiting    Consultations: Interventional radiology   Procedures/Studies: MR THORACIC SPINE WO CONTRAST  Result Date: 10/09/2022 CLINICAL DATA:  Thoracic compression fracture EXAM: MRI THORACIC SPINE WITHOUT CONTRAST TECHNIQUE: Multiplanar, multisequence MR imaging of the thoracic spine was performed. No intravenous contrast was administered. COMPARISON:  10/03/2022 FINDINGS: Evaluation is somewhat limited by motion artifact. Alignment: Unchanged S-shaped curvature of the thoracolumbar spine. Redemonstrated exaggeration of the normal thoracic kyphosis. Unchanged trace anterolisthesis of T2 on T3 and T3 on T4. Vertebrae: Redemonstrated compression fracture at T8, status post interval kyphoplasty. 60% vertebral body height loss and 2 mm retropulsion of the posteroinferior cortex, unchanged. Unchanged appearance of the inferior endplate of T7, with some edema and minimal height loss, favored to represent a subacute fracture, unchanged. Chronic compression deformity of T12, unchanged. No acute  fracture, suspicious osseous lesion, or evidence of discitis. Cord: Normal signal and morphology. Mildly increased signal in the spinal cord at T8 on the sagittal STIR sequence does not have a correlate on the sagittal T2 or axial T2, and is favored to be artifactual. Paraspinal and other soft tissues: Fluid-filled patulous esophagus. Otherwise negative. Disc levels: Multilevel facet arthropathy and degenerative disc disease,, which is described in detail on the prior exam. T8-T9; aforementioned retropulsion of the posteroinferior aspect of T8, facet arthropathy, and epidural lipomatosis contribute to mild thecal sac narrowing. Mild to moderate  bilateral neural foraminal narrowing. No other spinal canal stenosis. Redemonstrated mild right neural foraminal narrowing at T2-T3, T3-T4, T4-T5, and T5-T6. IMPRESSION: 1. Evaluation is somewhat limited by motion artifact. Within this limitation, status post interval kyphoplasty at T8, with unchanged 60% vertebral body height loss and 2 mm retropulsion of the posteroinferior cortex. Unchanged mild thecal sac narrowing at T8-T9. Mild to moderate bilateral neural foraminal narrowing at this level. No definite cord signal abnormality is seen. 2. Unchanged appearance of the inferior endplate of T7, with some edema and minimal height loss, favored to represent a subacute fracture. 3. Unchanged chronic compression deformity of T12. Electronically Signed   By: Wiliam Ke M.D.   On: 10/09/2022 20:20   IR VERTEBROPLASTY CERV/THOR BX INC UNI/BIL INC/INJECT/IMAGING  Result Date: 10/07/2022 INDICATION: Severe low back pain secondary to compression fracture at T8. EXAM: VERTEBROPLASTY AT T8 MEDICATIONS: As antibiotic prophylaxis, Ancef 2 g IV was ordered pre-procedure and administered intravenously within 1 hour of incision. All current medications are in the EMR and have been reviewed as part of this encounter. ANESTHESIA/SEDATION: Moderate (conscious) sedation was employed  during this procedure. A total of Versed 2 mg and Fentanyl 100 mcg was administered intravenously by the radiology nurse. Total intra-service moderate Sedation Time: 58 minutes. The patient's level of consciousness and vital signs were monitored continuously by radiology nursing throughout the procedure under my direct supervision. FLUOROSCOPY: Radiation Exposure Index (as provided by the fluoroscopic device): 901 mGy Kerma COMPLICATIONS: None immediate. TECHNIQUE: Informed written consent was obtained from the patient after a thorough discussion of the procedural risks, benefits and alternatives. All questions were addressed. Maximal Sterile Barrier Technique was utilized including caps, mask, sterile gowns, sterile gloves, sterile drape, hand hygiene and skin antiseptic. A timeout was performed prior to the initiation of the procedure. PROCEDURE: The patient was placed prone on the fluoroscopic table. Nasal oxygen was administered. Physiologic monitoring was performed throughout the duration of the procedure. The skin overlying the thoracolumbar region was prepped and draped in the usual sterile fashion. The T8 vertebral body was identified and the left pedicle was infiltrated with 0.25% Bupivacaine. This was then followed by the advancement of a 13-gauge Cook needle through the left pedicle into the posterior one-third at T8. Two passes were made with a 16 gauge core biopsy needle. Samples were sent off for pathologic analysis as per the request of the patient's physician. The 13 gauge Cook spinal needle was then advanced into the anterior 1/3. A gentle contrast injection demonstrated a trabecular pattern of contrast with opacification of a paraspinal vein. This necessitated the use of Gelfoam pledgets into the 13 gauge Cook spinal needle to avoid embolization of the paraspinous vein. At this time, methylmethacrylate mixture was reconstituted. Under biplane intermittent fluoroscopy, the methylmethacrylate was  then injected into the T8 vertebral body with filling of the vertebral body along the inferior and also the fractured superior endplate. No extravasation was noted into the disk spaces or posteriorly into the spinal canal. No epidural venous contamination was seen. The needle was then removed. Hemostasis was achieved at the skin entry site. There were no acute complications. Patient tolerated the procedure well. The patient was returned to the floor in stable condition. IMPRESSION: 1. Status post vertebral body augmentation for painful compression fracture at T8 using vertebroplasty technique. 2.  Core biopsies were obtained and sent for pathologic analysis. If the patient has known osteoporosis, recommend treatment as clinically indicated. If the patient's bone density status is unknown, DEXA scan  is recommended. Electronically Signed   By: Julieanne Cotton M.D.   On: 10/07/2022 08:45   VAS [place] LOWER EXTREMITY VENOUS (DVT)  Result Date: 10/06/2022  Lower Venous DVT Study Patient Name:  Meghan Alexander  Date of Exam:   10/06/2022 Medical Rec #: [id number]       Accession #:    [id number] Date of Birth: 12/23/1955        Patient Gender: F Patient Age:   40 years Exam Location:  [facility] Procedure:      VAS [place] LOWER EXTREMITY VENOUS (DVT) Referring Phys: Junious Silk --------------------------------------------------------------------------------  Indications: Edema.  Risk Factors: Cancer Lymphoma Chrohn's disease,. Comparison Study: Previous exam on 09/08/21 was negative for DVT Performing Technologist: Ernestene Mention RVT, RDMS  Examination Guidelines: A complete evaluation includes B-mode imaging, spectral Doppler, color Doppler, and power Doppler as needed of all accessible portions of each vessel. Bilateral testing is considered an integral part of a complete examination. Limited examinations for reoccurring indications may be performed as noted. The reflux portion of the exam is performed with the patient  in reverse Trendelenburg.  +---------+---------------+---------+-----------+----------+-------------------+ RIGHT    CompressibilityPhasicitySpontaneityPropertiesThrombus Aging      +---------+---------------+---------+-----------+----------+-------------------+ CFV      Full           Yes      Yes                                      +---------+---------------+---------+-----------+----------+-------------------+ SFJ      Full                                                             +---------+---------------+---------+-----------+----------+-------------------+ FV Prox  Full           Yes      Yes                                      +---------+---------------+---------+-----------+----------+-------------------+ FV Mid   Full           Yes      Yes                                      +---------+---------------+---------+-----------+----------+-------------------+ FV DistalFull           Yes      Yes                                      +---------+---------------+---------+-----------+----------+-------------------+ PFV      Full                                                             +---------+---------------+---------+-----------+----------+-------------------+ POP      Full           Yes  Yes                                      +---------+---------------+---------+-----------+----------+-------------------+ PTV      Full                                                             +---------+---------------+---------+-----------+----------+-------------------+ PERO     Full                                         Not well visualized +---------+---------------+---------+-----------+----------+-------------------+   +---------+---------------+---------+-----------+----------+-------------------+ LEFT     CompressibilityPhasicitySpontaneityPropertiesThrombus Aging       +---------+---------------+---------+-----------+----------+-------------------+ CFV      Full           Yes      Yes                                      +---------+---------------+---------+-----------+----------+-------------------+ SFJ      Full                                                             +---------+---------------+---------+-----------+----------+-------------------+ FV Prox  Full           Yes      Yes                                      +---------+---------------+---------+-----------+----------+-------------------+ FV Mid   Full           Yes      Yes                                      +---------+---------------+---------+-----------+----------+-------------------+ FV DistalFull           Yes      Yes                                      +---------+---------------+---------+-----------+----------+-------------------+ PFV      Full                                                             +---------+---------------+---------+-----------+----------+-------------------+ POP      Full           Yes      Yes                                      +---------+---------------+---------+-----------+----------+-------------------+  PTV      Full                                                             +---------+---------------+---------+-----------+----------+-------------------+ PERO     Full                                         Not well visualized +---------+---------------+---------+-----------+----------+-------------------+     Summary: BILATERAL: - No evidence of deep vein thrombosis seen in the lower extremities, bilaterally. - RIGHT: - A large cystic structure with internal calcifications is found in the popliteal fossa (6.8 x 1.0 x 4.8 cm).  LEFT: - No cystic structure found in the popliteal fossa.  *See table(s) above for measurements and observations. Electronically signed by Sherald Hess MD on 10/06/2022 at 1:24:52  PM.    Final    ECHOCARDIOGRAM COMPLETE  Result Date: 10/06/2022    ECHOCARDIOGRAM REPORT   Patient Name:   Meghan Alexander Date of Exam: 10/06/2022 Medical Rec #:  [id number]      Height:       62.0 in Accession #:    [id number]     Weight:       176.4 lb Date of Birth:  08/17/1955       BSA:          1.812 m Patient Age:    67 years       BP:           162/96 mmHg Patient Gender: F              HR:           101 bpm. Exam Location:  Inpatient Procedure: 2D Echo, Cardiac Doppler, Color Doppler and Intracardiac            Opacification Agent Indications:    Edema  History:        Patient has prior history of Echocardiogram examinations, most                 recent 10/08/2021. Arrythmias:Atrial Fibrillation; Risk                 Factors:Diabetes and Hypertension. ILD, lymphoma.  Sonographer:    Milda Smart Referring Phys: [id number] ALLISON L ELLIS  Sonographer Comments: Image acquisition challenging due to patient body habitus and Image acquisition challenging due to respiratory motion. IMPRESSIONS  1. Left ventricular ejection fraction, by estimation, is 55 to 60%. The left ventricle has normal function. The left ventricle has no regional wall motion abnormalities. Indeterminate diastolic filling due to E-A fusion.  2. Right ventricular systolic function is normal. The right ventricular size is normal.  3. The mitral valve is normal in structure. Mild mitral valve regurgitation. No evidence of mitral stenosis.  4. The aortic valve is normal in structure. Aortic valve regurgitation is not visualized. No aortic stenosis is present.  5. The inferior vena cava is normal in size with greater than 50% respiratory variability, suggesting right atrial pressure of 3 mmHg. FINDINGS  Left Ventricle: Left ventricular ejection fraction, by estimation, is 55 to 60%. The left ventricle has normal function. The left ventricle has no regional wall motion abnormalities. The left ventricular  internal cavity size was normal in size.  There is  no left ventricular hypertrophy. Indeterminate diastolic filling due to E-A fusion. Right Ventricle: The right ventricular size is normal. No increase in right ventricular wall thickness. Right ventricular systolic function is normal. Left Atrium: Left atrial size was normal in size. Right Atrium: Right atrial size was normal in size. Pericardium: There is no evidence of pericardial effusion. Mitral Valve: The mitral valve is normal in structure. There is mild calcification of the mitral valve leaflet(s). Mild mitral valve regurgitation. No evidence of mitral valve stenosis. Tricuspid Valve: The tricuspid valve is normal in structure. Tricuspid valve regurgitation is not demonstrated. No evidence of tricuspid stenosis. Aortic Valve: The aortic valve is normal in structure. Aortic valve regurgitation is not visualized. No aortic stenosis is present. Pulmonic Valve: The pulmonic valve was not well visualized. Pulmonic valve regurgitation is not visualized. No evidence of pulmonic stenosis. Aorta: The aortic root is normal in size and structure. Venous: The inferior vena cava is normal in size with greater than 50% respiratory variability, suggesting right atrial pressure of 3 mmHg. IAS/Shunts: No atrial level shunt detected by color flow Doppler.  LEFT VENTRICLE PLAX 2D LVIDd:         4.10 cm     Diastology LVIDs:         3.20 cm     LV e' medial:    5.11 cm/s LV PW:         0.90 cm     LV E/e' medial:  22.1 LV IVS:        0.70 cm     LV e' lateral:   7.29 cm/s LVOT diam:     2.30 cm     LV E/e' lateral: 15.5 LV SV:         70 LV SV Index:   39 LVOT Area:     4.15 cm  LV Volumes (MOD) LV vol d, MOD A2C: 86.1 ml LV vol d, MOD A4C: 84.8 ml LV vol s, MOD A2C: 39.0 ml LV vol s, MOD A4C: 38.6 ml LV SV MOD A2C:     47.1 ml LV SV MOD A4C:     84.8 ml LV SV MOD BP:      46.5 ml RIGHT VENTRICLE RV S prime:     9.25 cm/s TAPSE (M-mode): 1.3 cm LEFT ATRIUM             Index        RIGHT ATRIUM          Index LA diam:         3.80 cm 2.10 cm/m   RA Area:     7.52 cm LA Vol (A2C):   27.8 ml 15.34 ml/m  RA Volume:   12.90 ml 7.12 ml/m LA Vol (A4C):   29.3 ml 16.17 ml/m LA Biplane Vol: 30.4 ml 16.78 ml/m  AORTIC VALVE LVOT Vmax:   89.40 cm/s LVOT Vmean:  71.800 cm/s LVOT VTI:    0.168 m  AORTA Ao Root diam: 3.00 cm Ao Asc diam:  3.70 cm MITRAL VALVE MV Area (PHT): 4.80 cm     SHUNTS MV Decel Time: 158 msec     Systemic VTI:  0.17 m MR Peak grad: 89.1 mmHg     Systemic Diam: 2.30 cm MR Vmax:      472.00 cm/s MV E velocity: 113.00 cm/s Arvilla Meres MD Electronically signed by Arvilla Meres MD Signature Date/Time: 10/06/2022/11:19:14 AM  Final    CT Angio Chest PE W/Cm &/Or Wo Cm  Result Date: 10/05/2022 CLINICAL DATA:  Pulmonary embolism (PE) suspected, high prob. Back pain. EXAM: CT ANGIOGRAPHY CHEST WITH CONTRAST TECHNIQUE: Multidetector CT imaging of the chest was performed using the standard protocol during bolus administration of intravenous contrast. Multiplanar CT image reconstructions and MIPs were obtained to evaluate the vascular anatomy. RADIATION DOSE REDUCTION: This exam was performed according to the departmental dose-optimization program which includes automated exposure control, adjustment of the mA and/or kV according to patient size and/or use of iterative reconstruction technique. CONTRAST:  75mL OMNIPAQUE IOHEXOL 350 MG/ML SOLN COMPARISON:  MRI 10/03/2022 FINDINGS: Cardiovascular: No filling defects in the pulmonary arteries to suggest pulmonary emboli. Heart is normal size. Aorta is normal caliber. Coronary artery and aortic calcifications. Mediastinum/Nodes: No mediastinal, hilar, or axillary adenopathy. Esophagus is patulous, dilated and fluid-filled. This is unchanged since prior study. Trachea and thyroid unremarkable. Lungs/Pleura: Medial left apical nodule again noted measuring approximately 9 mm compared to 10 mm previously. Linear densities in the lingula are stable, likely scarring. No  acute confluent opacities or effusions. 7 mm inferior lingular nodule on image 80 is unchanged since prior study. Upper Abdomen: Calcifications in the spleen and liver compatible with old granulomatous disease. No acute findings. Musculoskeletal: Chest wall soft tissues are unremarkable. Bilateral breast implants grossly unremarkable. Moderate to severe compression fracture noted at T8 as seen on prior MRI, unchanged. Stable mild compression fracture through the superior endplate of T12. Review of the MIP images confirms the above findings. IMPRESSION: No evidence of pulmonary embolus. Stable left upper lobe nodules measuring up to 9 mm. Consider follow-up imaging in 6-12 months to assess stability. Patulous, fluid-filled esophagus is unchanged. Coronary artery disease. No acute cardiopulmonary disease. Aortic Atherosclerosis (ICD10-I70.0). Electronically Signed   By: Charlett Nose M.D.   On: 10/05/2022 00:09   DG Chest Port 1 View  Result Date: 10/04/2022 CLINICAL DATA:  Shortness of breath. EXAM: PORTABLE CHEST 1 VIEW COMPARISON:  10/02/2022, CT 09/26/2022 FINDINGS: Improved lung aeration. Stable heart size and mediastinal contours. Minor atelectasis at the left lung base, no confluent consolidation. No pleural fluid or pneumothorax. No pulmonary edema. Left apical nodule on CT not seen by radiograph IMPRESSION: Minor left basilar atelectasis. Electronically Signed   By: Narda Rutherford M.D.   On: 10/04/2022 22:50   MR THORACIC SPINE WO CONTRAST  Result Date: 10/03/2022 CLINICAL DATA:  Initial evaluation for acute myelopathy. EXAM: MRI THORACIC SPINE WITHOUT CONTRAST TECHNIQUE: Multiplanar, multisequence MR imaging of the thoracic spine was performed. No intravenous contrast was administered. COMPARISON:  Prior study from 09/26/2022. FINDINGS: Alignment: Examination somewhat degraded by motion artifact. Sigmoid scoliosis, with dominant left convex component. Mild exaggeration of the normal thoracic  kyphosis. Trace degenerative anterolisthesis of T2 on T3 and T3 on T4. Vertebrae: Compression deformity involving the T8 vertebral body with up to 60% height loss and trace 2 mm bony retropulsion, subacute in appearance with some persistent marrow edema. Additional subacute compression fracture seen at the adjacent inferior endplate of T7 without significant height loss or bony retropulsion. Additional T12 endplate fracture is largely chronic in appearance without significant residual marrow edema. Otherwise, vertebral body height maintained. Underlying bone marrow signal intensity within normal limits. No worrisome osseous lesions. Cord: Evaluation of the thoracic cord is limited by motion. On sagittal T2 and STIR sequences, there is questionable cord signal change at the level of T8, which could reflect mild edema and/or contusion related to the adjacent  fracture (series 4, image 11). This is not well seen on corresponding axial sequences. Finding is not entirely certain given motion artifact on this exam. Otherwise normal signal and morphology. Paraspinal and other soft tissues: Paraspinous soft tissues demonstrate no acute finding. Fluid-filled patulous esophagus noted. Disc levels: T1-2: Negative interspace. Left greater than right facet hypertrophy. No stenosis. T2-3: Mild disc bulge.  Left-sided facet hypertrophy.  No stenosis. T3-4: Right eccentric disc bulge with left greater than right facet hypertrophy. No spinal stenosis. Mild right foraminal narrowing. Left neural foramen remains patent. T4-5: Mild disc bulge with right greater left facet hypertrophy. No spinal stenosis. Mild right foraminal narrowing. Left neural foramen remains patent. T5-6: Minimal disc bulge with bilateral facet hypertrophy. No spinal stenosis. Mild right foraminal narrowing. Left neural foramina remains patent. T6-7: Minimal disc bulge. Right-sided facet hypertrophy. No stenosis. T7-8: Negative interspace. Right greater left facet  hypertrophy. No stenosis. T8-9: 2 mm bony retropulsion related to the T8 fracture. Underlying mild disc bulge with bilateral facet hypertrophy. Resultant mild spinal stenosis. Mild to moderate bilateral foraminal narrowing. T9-10: Negative interspace. Bilateral facet hypertrophy. No stenosis. T10-11: Minimal disc bulge. Bilateral facet hypertrophy. No significant stenosis. T11-12: Mild diffuse disc bulge. Bilateral facet hypertrophy. No significant stenosis. T12-L1: Diffuse disc bulge with bilateral facet hypertrophy. No significant stenosis. IMPRESSION: 1. Motion degraded exam. 2. Subacute compression fracture involving the T8 vertebral body with up to 60% height loss and trace 2 mm bony retropulsion. Resultant mild spinal stenosis with mild to moderate bilateral foraminal narrowing at this level. 3. Question subtle cord signal change at the level of T8, which could reflect mild edema and/or contusion related to the adjacent fracture. Finding is not entirely certain given motion degradation on this exam. Correlation with physical exam recommended. 4. Additional subacute compression fracture involving the inferior endplate of T7 without significant height loss or retropulsion. 5. Chronic T12 fracture. 6. Underlying multilevel degenerative spondylosis and facet hypertrophy with resultant mild right foraminal narrowing at T3-4 through T5-6, with mild to moderate bilateral foraminal narrowing at T8-9. Electronically Signed   By: Rise Mu M.D.   On: 10/03/2022 02:28   MR Cervical Spine Wo Contrast  Result Date: 10/03/2022 CLINICAL DATA:  Initial evaluation for acute myelopathy. EXAM: MRI CERVICAL SPINE WITHOUT CONTRAST TECHNIQUE: Multiplanar, multisequence MR imaging of the cervical spine was performed. No intravenous contrast was administered. COMPARISON:  Prior study from 10/10/2011. FINDINGS: Alignment: Examination degraded by motion artifact. Straightening with slight reversal of the normal cervical  lordosis. Trace degenerative anterolisthesis of C2 on C3, C4 on C5, C6 on C7, and C7 on T1. Vertebrae: Vertebral body height maintained without acute or chronic fracture. Bone marrow signal intensity within normal limits. No discrete or worrisome osseous lesions or abnormal marrow edema. Cord: Normal signal and morphology. Posterior Fossa, vertebral arteries, paraspinal tissues: Unremarkable. Disc levels: C2-C3: Trace anterolisthesis. No significant disc bulge. Moderate to advanced left worse than right facet arthrosis. No canal or foraminal stenosis. C3-C4: Minimal disc bulge with mild bilateral uncovertebral hypertrophy. Moderate left worse than right facet arthrosis. No spinal stenosis. Mild bilateral C4 foraminal stenosis. C4-C5: Trace anterolisthesis with diffuse disc osteophyte complex. Broad posterior component flattens the ventral thecal sac. Severe left with moderate right facet arthrosis. No spinal stenosis. Moderate to severe left with mild right C5 foraminal stenosis. C5-C6: Mild degenerative disc space narrowing with diffuse disc osteophyte complex. Moderate left with mild right facet arthrosis. No spinal stenosis. Severe left with moderate right C6 foraminal narrowing. C6-C7: Anterolisthesis with  mild disc bulge. Moderate left with mild right facet hypertrophy. No spinal stenosis. Foramina remain patent. C7-T1: Anterolisthesis with mild disc bulge. Moderate left with mild right facet hypertrophy. No canal or foraminal stenosis. IMPRESSION: 1. Normal MRI appearance of the cervical spinal cord. No cord signal changes to suggest myelopathy. 2. Multilevel cervical spondylosis without significant spinal stenosis. Moderate to severe left C5 foraminal stenosis, with severe left and moderate right C6 foraminal narrowing. 3. Moderate to advanced multilevel facet hypertrophy as above, overall worse on the left. Findings could contribute to neck pain. Electronically Signed   By: Rise Mu M.D.   On:  10/03/2022 02:13   DG Chest Port 1 View  Result Date: 10/02/2022 CLINICAL DATA:  Chest pain and back pain. EXAM: PORTABLE CHEST 1 VIEW COMPARISON:  August 03, 2022 FINDINGS: The heart size and mediastinal contours are within normal limits. Mild, stable linear scarring and/or atelectasis is seen within the left lung base. There is no evidence of a pleural effusion or pneumothorax. Multilevel degenerative changes seen throughout the thoracic spine. IMPRESSION: Mild left basilar linear scarring and/or atelectasis. Electronically Signed   By: Aram Candela M.D.   On: 10/02/2022 23:46   CT T-SPINE NO CHARGE  Result Date: 09/26/2022 CLINICAL DATA:  Back pain EXAM: CT THORACIC SPINE WITHOUT CONTRAST TECHNIQUE: Multidetector CT images of the thoracic were obtained using the standard protocol without intravenous contrast. RADIATION DOSE REDUCTION: This exam was performed according to the departmental dose-optimization program which includes automated exposure control, adjustment of the mA and/or kV according to patient size and/or use of iterative reconstruction technique. COMPARISON:  Chest radiograph done on 08/03/2022 FINDINGS: Alignment: Alignment of posterior margins of vertebral bodies is within normal limits. There is S shaped scoliosis in thoracic spine. Vertebrae: There is 50-60% decrease in height of body of T8 vertebra which was not evident in the chest radiograph done on 08/03/2022 suggesting recent compression. There is 30-40% decrease in height of body of T12 vertebra, more so on the left side with possible interval worsening in comparison with the previous chest radiographs. Paraspinal and other soft tissues: Thoracic esophagus is distended with fluid in the lumen. This may suggest achalasia are chronic reflux or stricture at the gastroesophageal junction. There is inhomogeneous attenuation in thyroid with scattered nodules measuring up to 17 mm in diameter. Disc levels: There is no central spinal  stenosis. There is no significant narrowing of neural foramina. IMPRESSION: There is 50-60% decrease in height of body of T8 vertebra suggesting recent compression fracture. There is 30-40% narrowing of upper endplate of body of T12 vertebra with possible interval worsening suggesting acute on chronic compression. Follow-up MRI as clinically warranted should be considered. There is no central spinal stenosis or significant narrowing of neural foramina in the thoracic spine. There is distention of the thoracic esophagus with air-fluid level in the lumen. This may suggest achalasia or stricture in the distal common bile duct or marked chronic gastroesophageal reflux. There are multiple nodules in thyroid measuring up to 17 mm in diameter. Recommend non-emergent thyroid ultrasound.Reference: J Am Coll Radiol. 2015 Feb;12(2): 143-50 Electronically Signed   By: Ernie Avena M.D.   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 Multidetector CT imaging through the chest, abdomen and pelvis was performed  using the standard protocol during bolus administration of intravenous contrast. Multiplanar reconstructed images and MIPs were obtained and reviewed to evaluate the vascular anatomy. RADIATION DOSE REDUCTION: This exam was performed according to the departmental dose-optimization program which includes automated exposure control, adjustment of the mA and/or kV according to patient size and/or use of iterative reconstruction technique. CONTRAST:  OMNIPAQUE IOHEXOL 350 MG/ML SOLN COMPARISON:  CT abdomen and pelvis dated 03/08/2022, CT chest dated 01/26/2019 CTA chest dated 10/08/2021 FINDINGS: CTA CHEST FINDINGS Cardiovascular: Preferential opacification of  the thoracic aorta. No evidence of thoracic aortic aneurysm or dissection. Normal heart size. No pericardial effusion. Anatomic variant common origin of the brachiocephalic and common carotid arteries. No pulmonary emboli. Mediastinum/Nodes: Imaged thyroid gland without nodules meeting criteria for imaging follow-up by size. Patulous, fluid-filled esophagus is again seen. No pathologically enlarged axillary, supraclavicular, mediastinal, or hilar lymph nodes. Lungs/Pleura: The central airways are patent. New medial left apical irregular nodule measuring 1.0 x 0.4 cm (6:29). Linear atelectasis/scarring along the anteromedial left upper lobe. Bilateral lower lobe subpleural band-like ground-glass opacities, likely atelectasis. Unchanged 8 x 6 mm lingular nodule (6:82). No specific follow-up imaging recommended. No pneumothorax. No pleural effusion. Musculoskeletal: No acute or abnormal lytic or blastic osseous lesions. T8 and T12 compression deformities are better evaluated on same day CT thoracic spine. Review of the MIP images confirms the above findings. CTA ABDOMEN AND PELVIS FINDINGS VASCULAR Aorta: Aortic atherosclerosis. Normal caliber aorta without aneurysm, dissection, vasculitis or significant stenosis. Celiac: Patent without evidence of aneurysm, dissection, vasculitis or significant stenosis. SMA: Patent without evidence of aneurysm, dissection, vasculitis or significant stenosis. Renals: Both renal arteries are patent without evidence of aneurysm, dissection, vasculitis, fibromuscular dysplasia or significant stenosis. IMA: Patent without evidence of aneurysm, dissection, vasculitis or significant stenosis. Inflow: Patent without evidence of aneurysm, dissection, vasculitis or significant stenosis. Proximal Outflow: Bilateral common femoral and visualized portions of the superficial and profunda femoral arteries are patent without evidence of aneurysm, dissection, vasculitis or significant stenosis.  Veins: No obvious venous abnormality within the limitations of this arterial phase study. Review of the MIP images confirms the above findings. NON-VASCULAR Hepatobiliary: Unchanged segment 6 cyst. No intra or extrahepatic biliary ductal dilation. Normal gallbladder. Pancreas: No focal lesions or main ductal dilation. Spleen: Normal in size without focal abnormality. Scattered calcified granulomas. Adrenals/Urinary Tract: No adrenal nodules. No suspicious renal mass, calculi or hydronephrosis. No focal bladder wall thickening. Stomach/Bowel: Normal appearance of the stomach. No evidence of bowel wall thickening, distention, or inflammatory changes. Normal appendix. Lymphatic: No enlarged abdominal or pelvic lymph nodes. Reproductive: No adnexal masses. Other: No free fluid, fluid collection, or free air. Musculoskeletal: No acute or abnormal lytic or blastic osseous lesions. Small fat-containing paraumbilical hernia. Review of the MIP images confirms the above findings. IMPRESSION: 1. No evidence of aortic aneurysm or dissection. T8 and T12 compression deformities are better evaluated on same day CT thoracic spine. 2. No pulmonary emboli. 3. New medial left apical irregular nodule measuring 1.0 x 0.4 cm, likely infectious/inflammatory. Consider non-contrast chest CT in 6-12 months to ensure resolution. 4. Persistent patulous, fluid-filled esophagus. 5.  Aortic Atherosclerosis (ICD10-I70.0). Electronically Signed   By: Agustin Cree M.D.   On: 09/26/2022 14:41   CT L-SPINE NO CHARGE  Result Date: 09/26/2022 CLINICAL DATA:  Back pain EXAM: CT LUMBAR SPINE WITHOUT CONTRAST TECHNIQUE: Multidetector CT imaging of the lumbar spine was performed without intravenous contrast administration. Multiplanar CT image reconstructions were also generated. RADIATION DOSE REDUCTION: This exam  was performed according to the departmental dose-optimization program which includes automated exposure control, adjustment of the mA and/or kV  according to patient size and/or use of iterative reconstruction technique. COMPARISON:  CT abdomen and pelvis done on 03/08/2022 FINDINGS: Segmentation: There are 5 non-rib-bearing vertebrae and lumbar spine Alignment: There is minimal 1-2 mm anterolisthesis at L4-L5 level. Vertebrae: No recent fracture is seen in the lumbar spine. There is mild to moderate decrease in height of upper endplate of body of T12 vertebra which was not evident in the previous CT abdomen done on 03/08/2022. Possible hemangiomas are noted in the bodies of L3 and L4 vertebrae with coarse trabeculae. Paraspinal and other soft tissues: Paraspinal soft tissues are unremarkable. There are scattered calcifications and atherosclerotic plaques in aorta. Disc levels: There is mild encroachment of neural foramina by bulging of the annulus at L3-L4 and L4-L5 levels. There is no central spinal stenosis. IMPRESSION: No recent fracture is seen in lumbar spine. There is 30-40% recent compression in the body of T12 vertebra. There is no central spinal stenosis. There is mild encroachment of neural foramina by bulging of the annulus and facet hypertrophy at L3-L4 and L4-L5 levels. There is minimal anterolisthesis at the L4-L5 level. Coarse trabeculae in the bodies of L3 and L4 vertebrae suggest possible hemangiomas. Electronically Signed   By: Ernie Avena M.D.   On: 09/26/2022 14:35      Subjective: Patient seen and examined at bedside today.  Hemodynamically stable.  Comfortable today.  Back pain is significantly better.  Eager to go home  Discharge Exam: Vitals:   10/11/22 0450 10/11/22 0819  BP: (!) 151/69 (!) 152/79  Pulse: 84 84  Resp: 16 18  Temp: 97.8 F (36.6 C) 97.8 F (36.6 C)  SpO2: 97% 96%   Vitals:   10/10/22 1733 10/10/22 1951 10/11/22 0450 10/11/22 0819  BP: (!) 147/96 (!) 167/95 (!) 151/69 (!) 152/79  Pulse: 94 87 84 84  Resp: 18 18 16 18   Temp: 97.7 F (36.5 C) (!) 97.2 F (36.2 C) 97.8 F (36.6 C) 97.8  F (36.6 C)  TempSrc: Oral Oral Oral   SpO2: 98% 97% 97% 96%  Weight:      Height:        General: Pt is alert, awake, not in acute distress Cardiovascular: RRR, S1/S2 +, no rubs, no gallops Respiratory: CTA bilaterally, no wheezing, no rhonchi Abdominal: Soft, NT, ND, bowel sounds + Extremities: no edema, no cyanosis    The results of significant diagnostics from this hospitalization (including imaging, microbiology, ancillary and laboratory) are listed below for reference.     Microbiology: Recent Results (from the past 240 hour(s))  SARS Coronavirus 2 by RT PCR (hospital order, performed in [facility] hospital lab) *cepheid single result test* Anterior Nasal Swab     Status: None   Collection Time: 10/04/22 10:21 PM   Specimen: Anterior Nasal Swab  Result Value Ref Range Status   SARS Coronavirus 2 by RT PCR NEGATIVE NEGATIVE Final    Comment: Performed at [facility], [address] [place], [place] [address]     Labs: BNP (last 3 results) Recent Labs    10/03/22 0019 10/04/22 1948  BNP 31.5 72.9   Basic Metabolic Panel: Recent Labs  Lab 10/04/22 1948 10/05/22 0003 10/05/22 0934 10/05/22 1359 10/06/22 0412 10/07/22 0503 10/09/22 0508 10/10/22 0448  NA 139   < >  --  143 139 141 139 137  K 3.2*   < >  --  3.1* 3.2* 3.8 3.3* 3.5  CL 97*  --   --  100 104 102 98 97*  CO2 26  --   --  27 26 28 30 29   GLUCOSE 237*  --   --  78 127* 104* 126* 156*  BUN 8  --   --  6* 6* 5* 8 12  CREATININE 1.31*  --  1.06* 1.03* 1.08* 1.05* 0.94 0.99  CALCIUM 9.1  --   --  9.2 8.3* 8.8* 9.2 9.3  MG 1.3*  --  1.8 1.8 1.6* 2.0  --   --   PHOS  --   --  4.1 3.5  --   --   --   --    < > = values in this interval not displayed.   Liver Function Tests: Recent Labs  Lab 10/04/22 1948  AST 28  ALT 31  ALKPHOS 81  BILITOT 0.8  PROT 5.8*  ALBUMIN 3.4*   No results for input(s): "LIPASE", "AMYLASE" in the last 168 hours. No results for input(s): "AMMONIA" in the  last 168 hours. CBC: Recent Labs  Lab 10/04/22 1948 10/05/22 0003 10/05/22 0934 10/06/22 0412 10/09/22 0508  WBC 4.8  --  4.2 3.6* 4.7  NEUTROABS 2.8  --   --   --   --   HGB 11.3* 9.5* 11.1* 9.9* 11.3*  HCT 36.3 28.0* 35.3* 32.4* 35.1*  MCV 84.6  --  86.7 85.5 81.4  PLT 236  --  231 244 279   Cardiac Enzymes: Recent Labs  Lab 10/05/22 1359  CKTOTAL 83   BNP: Invalid input(s): "POCBNP" CBG: Recent Labs  Lab 10/10/22 1338 10/10/22 1703 10/10/22 1954 10/11/22 0455 10/11/22 0816  GLUCAP 131* 150* 115* 167* 200*   D-Dimer No results for input(s): "DDIMER" in the last 72 hours. Hgb A1c No results for input(s): "HGBA1C" in the last 72 hours. Lipid Profile No results for input(s): "CHOL", "HDL", "LDLCALC", "TRIG", "CHOLHDL", "LDLDIRECT" in the last 72 hours. Thyroid function studies No results for input(s): "TSH", "T4TOTAL", "T3FREE", "THYROIDAB" in the last 72 hours.  Invalid input(s): "FREET3" Anemia work up No results for input(s): "VITAMINB12", "FOLATE", "FERRITIN", "TIBC", "IRON", "RETICCTPCT" in the last 72 hours. Urinalysis    Component Value Date/Time   COLORURINE YELLOW 10/05/2022 1053   APPEARANCEUR CLEAR 10/05/2022 1053   LABSPEC 1.014 10/05/2022 1053   PHURINE 8.0 10/05/2022 1053   GLUCOSEU NEGATIVE 10/05/2022 1053   HGBUR NEGATIVE 10/05/2022 1053   BILIRUBINUR NEGATIVE 10/05/2022 1053   KETONESUR NEGATIVE 10/05/2022 1053   PROTEINUR NEGATIVE 10/05/2022 1053   NITRITE NEGATIVE 10/05/2022 1053   LEUKOCYTESUR SMALL (A) 10/05/2022 1053   Sepsis Labs Recent Labs  Lab 10/04/22 1948 10/05/22 0934 10/06/22 0412 10/09/22 0508  WBC 4.8 4.2 3.6* 4.7   Microbiology Recent Results (from the past 240 hour(s))  SARS Coronavirus 2 by RT PCR (hospital order, performed in [facility] hospital lab) *cepheid single result test* Anterior Nasal Swab     Status: None   Collection Time: 10/04/22 10:21 PM   Specimen: Anterior Nasal Swab  Result Value Ref Range  Status   SARS Coronavirus 2 by RT PCR NEGATIVE NEGATIVE Final    Comment: Performed at [facility], [address] [place], [place] [address]    Please note: You were cared for by a hospitalist during your hospital stay. Once you are discharged, your primary care physician will handle any further medical issues. Please note that NO REFILLS for any  discharge medications will be authorized once you are discharged, as it is imperative that you return to your primary care physician (or establish a relationship with a primary care physician if you do not have one) for your post hospital discharge needs so that they can reassess your need for medications and monitor your lab values.    Time coordinating discharge: 40 minutes  SIGNED:   Burnadette Pop, MD  [facility] 10/11/2022, 10:51 AM Pager [phone]  If 7PM-7AM, please contact night-coverage [email or web address] Password [email or web address]

## 2022-10-17 ENCOUNTER — Other Ambulatory Visit (HOSPITAL_COMMUNITY): Payer: Self-pay

## 2022-10-24 ENCOUNTER — Other Ambulatory Visit: Payer: Self-pay

## 2022-11-01 ENCOUNTER — Telehealth: Payer: Self-pay

## 2022-11-01 ENCOUNTER — Other Ambulatory Visit (HOSPITAL_COMMUNITY): Payer: Self-pay | Admitting: Interventional Radiology

## 2022-11-01 DIAGNOSIS — M549 Dorsalgia, unspecified: Secondary | ICD-10-CM

## 2022-11-01 NOTE — Telephone Encounter (Signed)
Pt contacted regarding request for change on appt. Pt scheduled for 11/03/22. Pt aware of date and time.

## 2022-11-02 ENCOUNTER — Other Ambulatory Visit: Payer: Self-pay

## 2022-11-02 DIAGNOSIS — C8218 Follicular lymphoma grade II, lymph nodes of multiple sites: Secondary | ICD-10-CM

## 2022-11-03 ENCOUNTER — Inpatient Hospital Stay: Payer: Medicare Other | Attending: Hematology

## 2022-11-03 ENCOUNTER — Inpatient Hospital Stay: Payer: Medicare Other | Admitting: Hematology

## 2022-11-03 ENCOUNTER — Other Ambulatory Visit: Payer: Self-pay

## 2022-11-03 VITALS — BP 138/90 | HR 123 | Temp 97.5°F | Resp 20 | Wt 172.7 lb

## 2022-11-03 DIAGNOSIS — K220 Achalasia of cardia: Secondary | ICD-10-CM | POA: Diagnosis not present

## 2022-11-03 DIAGNOSIS — K5090 Crohn's disease, unspecified, without complications: Secondary | ICD-10-CM | POA: Insufficient documentation

## 2022-11-03 DIAGNOSIS — Z8379 Family history of other diseases of the digestive system: Secondary | ICD-10-CM | POA: Insufficient documentation

## 2022-11-03 DIAGNOSIS — R197 Diarrhea, unspecified: Secondary | ICD-10-CM | POA: Insufficient documentation

## 2022-11-03 DIAGNOSIS — Z803 Family history of malignant neoplasm of breast: Secondary | ICD-10-CM | POA: Insufficient documentation

## 2022-11-03 DIAGNOSIS — Z79899 Other long term (current) drug therapy: Secondary | ICD-10-CM | POA: Diagnosis not present

## 2022-11-03 DIAGNOSIS — C8218 Follicular lymphoma grade II, lymph nodes of multiple sites: Secondary | ICD-10-CM | POA: Insufficient documentation

## 2022-11-03 DIAGNOSIS — M549 Dorsalgia, unspecified: Secondary | ICD-10-CM | POA: Insufficient documentation

## 2022-11-03 DIAGNOSIS — E042 Nontoxic multinodular goiter: Secondary | ICD-10-CM

## 2022-11-03 DIAGNOSIS — Z801 Family history of malignant neoplasm of trachea, bronchus and lung: Secondary | ICD-10-CM | POA: Diagnosis not present

## 2022-11-03 DIAGNOSIS — E119 Type 2 diabetes mellitus without complications: Secondary | ICD-10-CM | POA: Diagnosis not present

## 2022-11-03 DIAGNOSIS — R918 Other nonspecific abnormal finding of lung field: Secondary | ICD-10-CM

## 2022-11-03 LAB — CBC WITH DIFFERENTIAL (CANCER CENTER ONLY)
Abs Immature Granulocytes: 0.04 10*3/uL (ref 0.00–0.07)
Basophils Absolute: 0.1 10*3/uL (ref 0.0–0.1)
Basophils Relative: 1 %
Eosinophils Absolute: 0.3 10*3/uL (ref 0.0–0.5)
Eosinophils Relative: 3 %
HCT: 36.7 % (ref 36.0–46.0)
Hemoglobin: 11.9 g/dL — ABNORMAL LOW (ref 12.0–15.0)
Immature Granulocytes: 0 %
Lymphocytes Relative: 16 %
Lymphs Abs: 1.5 10*3/uL (ref 0.7–4.0)
MCH: 26.6 pg (ref 26.0–34.0)
MCHC: 32.4 g/dL (ref 30.0–36.0)
MCV: 81.9 fL (ref 80.0–100.0)
Monocytes Absolute: 1.2 10*3/uL — ABNORMAL HIGH (ref 0.1–1.0)
Monocytes Relative: 13 %
Neutro Abs: 6.3 10*3/uL (ref 1.7–7.7)
Neutrophils Relative %: 67 %
Platelet Count: 214 10*3/uL (ref 150–400)
RBC: 4.48 MIL/uL (ref 3.87–5.11)
RDW: 14.6 % (ref 11.5–15.5)
WBC Count: 9.3 10*3/uL (ref 4.0–10.5)
nRBC: 0 % (ref 0.0–0.2)

## 2022-11-03 LAB — CMP (CANCER CENTER ONLY)
ALT: 24 U/L (ref 0–44)
AST: 33 U/L (ref 15–41)
Albumin: 3.9 g/dL (ref 3.5–5.0)
Alkaline Phosphatase: 77 U/L (ref 38–126)
Anion gap: 11 (ref 5–15)
BUN: 17 mg/dL (ref 8–23)
CO2: 30 mmol/L (ref 22–32)
Calcium: 9.6 mg/dL (ref 8.9–10.3)
Chloride: 98 mmol/L (ref 98–111)
Creatinine: 1.17 mg/dL — ABNORMAL HIGH (ref 0.44–1.00)
GFR, Estimated: 51 mL/min — ABNORMAL LOW (ref >60)
Glucose, Bld: 170 mg/dL — ABNORMAL HIGH (ref 70–99)
Potassium: 3.9 mmol/L (ref 3.5–5.1)
Sodium: 139 mmol/L (ref 135–145)
Total Bilirubin: 0.3 mg/dL (ref 0.3–1.2)
Total Protein: 6.3 g/dL — ABNORMAL LOW (ref 6.5–8.1)

## 2022-11-03 LAB — LACTATE DEHYDROGENASE: LDH: 190 U/L (ref 98–192)

## 2022-11-03 LAB — SAMPLE TO BLOOD BANK

## 2022-11-03 NOTE — Progress Notes (Signed)
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   INTERVAL HISTORY  [name] is a 67 y.o. 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   Patient was last seen by me on 09/26/2022 and complained of central back pain and diarrhea following her her diagnosis of inflammatory bowel disease and Norovirus in April 2024.   Today, she reports that her central back pain has mildly improved but continues to be bothersome.  She reports that her ankle recently fractured in two areas which is painful with swelling. Patient notes that the swelling began one month ago. She denies any bleeding issues.  Her Crohn's disease does limit her dietary options. She reports that her Crohn's disease is well managed and her diarrhea has significantly improved.   Patient reports that steroids did improve achalasia. She notes that she did have surgery 5 years ago and is planning to repeat the procedure in the near future. Patient received a bone density scan in her home last week.  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   GERD (gastroesophageal reflux disease)    hx of reflux-went away with elevating HOB    Hypertension    Pneumonia    hx of    PONV (postoperative nausea and vomiting)    Type 2 diabetes mellitus (HCC) 08/02/2021    SURGICAL HISTORY: Past Surgical History:  Procedure Laterality Date   ABDOMINAL HYSTERECTOMY     ADENOIDECTOMY     BIOPSY  03/10/2022   Procedure: BIOPSY;  Surgeon: Vida Rigger, MD;  Location: [facility] ENDOSCOPY;  Service: Gastroenterology;;   BRONCHIAL BRUSHINGS  08/03/2021   Procedure: BRONCHIAL BRUSHINGS;  Surgeon: Leslye Peer, MD;  Location: [facility] ENDOSCOPY;  Service: Cardiopulmonary;;   BRONCHIAL WASHINGS  08/03/2021   Procedure: BRONCHIAL WASHINGS;  Surgeon: Leslye Peer, MD;  Location: [facility] ENDOSCOPY;  Service: Cardiopulmonary;;   cosmetic surgeries     ESOPHAGEAL MANOMETRY N/A 12/02/2015   Procedure: ESOPHAGEAL MANOMETRY (EM);  Surgeon: Willis Modena, MD;  Location: [facility] ENDOSCOPY;  Service: Endoscopy;  Laterality: N/A;   ESOPHAGOGASTRODUODENOSCOPY N/A 12/02/2015   Procedure: ESOPHAGOGASTRODUODENOSCOPY (EGD);  Surgeon: Willis Modena, MD;  Location: [facility] ENDOSCOPY;  Service: Endoscopy;  Laterality: N/A;   FLEXIBLE SIGMOIDOSCOPY N/A 03/10/2022   Procedure: FLEXIBLE SIGMOIDOSCOPY;  Surgeon: Vida Rigger, MD;  Location: [facility] ENDOSCOPY;  Service: Gastroenterology;  Laterality: N/A;   IR VERTEBROPLASTY CERV/THOR BX INC UNI/BIL INC/INJECT/IMAGING  10/05/2022   lymph node removal left leg     cat scratch surgery    ORIF  ANKLE FRACTURE Right 09/03/2021   Procedure: OPEN REDUCTION INTERNAL FIXATION (ORIF) ANKLE FRACTURE;  Surgeon: Joen Laura, MD;  Location: [facility] OR;  Service: Orthopedics;  Laterality: Right;   THROAT SURGERY     sleep apnea surgery    TONSILLECTOMY     VIDEO BRONCHOSCOPY  08/03/2021   Procedure: VIDEO BRONCHOSCOPY WITH FLUORO;  Surgeon: Leslye Peer, MD;  Location[facility] ENDOSCOPY;   Service: Cardiopulmonary;;    SOCIAL HISTORY: Social History   Socioeconomic History   Marital status: Married    Spouse name: Not on file   Number of children: Not on file   Years of education: Not on file   Highest education level: Not on file  Occupational History   Not on file  Tobacco Use   Smoking status: Never   Smokeless tobacco: Never  Vaping Use   Vaping status: Never Used  Substance and Sexual Activity   Alcohol use: No    Alcohol/week: 0.0 standard drinks of alcohol   Drug use: No   Sexual activity: Not on file  Other Topics Concern   Not on file  Social History Narrative   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, newspaper at [employer]   Never smoker never drinker   No drugs   Multiple allergies   Lives at home with her husband of 8 years since 2008   Social Determinants of Health   Financial Resource Strain: Not on file  Food Insecurity: No Food Insecurity (10/05/2022)   Hunger Vital Sign    Worried About Running Out of Food in the Last Year: Never true    Ran Out of Food in the Last Year: Never true  Transportation Needs: No Transportation Needs (10/05/2022)   PRAPARE - Administrator, Civil Service (Medical): No    Lack of Transportation (Non-Medical): No  Physical Activity: Not on file  Stress: Not on file  Social Connections: Not on file  Intimate Partner Violence: Not At Risk (10/05/2022)   Humiliation, Afraid, Rape, and Kick questionnaire    Fear of Current or Ex-Partner: No    Emotionally Abused: No    Physically Abused: No    Sexually Abused: No    FAMILY HISTORY: Family History  Problem Relation Age of Onset   Lung cancer Father        1982 died from it   Hernia Mother    Breast cancer Maternal Aunt        multiple    Breast cancer Maternal Grandmother    Breast cancer Paternal Grandmother    Breast cancer Cousin        cousins multiple    Colon cancer Neg Hx    Colon polyps Neg Hx    Rectal cancer Neg Hx     Stomach cancer Neg Hx     ALLERGIES:  is allergic to latex, codeine, atenolol, other, hydrocodone, and oxycodone.  MEDICATIONS:  Current Outpatient Medications  Medication Sig Dispense Refill   BENADRYL ALLERGY 25 MG tablet Take 25 mg by mouth at bedtime.     CYLTEZO-CD/UC/HS STARTER 40 MG/0.8ML AJKT Inject 160 mLs into the skin every 14 (fourteen) days. Started 09/10/2022.     HUMALOG KWIKPEN 100 UNIT/ML KwikPen Inject 5 Units into the skin with breakfast, with lunch, and with evening meal. (Patient taking differently: Inject 15 Units into the skin See admin instructions. Inject 15 units into the skin before breakfast and an additional 15 units once a day as needed for  elevated BGL) 15 mL 11   lidocaine (LIDODERM) 5 % Place 1 patch onto the skin daily. Remove & Discard patch within 12 hours or as directed by MD 30 patch 0   losartan (COZAAR) 50 MG tablet Take 1 tablet (50 mg total) by mouth daily. 30 tablet 0   meclizine (ANTIVERT) 25 MG tablet Take 12.5 mg by mouth 4 (four) times daily.     Melatonin 10 MG TABS Take 10 mg by mouth at bedtime.     metFORMIN (GLUCOPHAGE) 500 MG tablet Take 500 mg by mouth 2 (two) times daily with a meal.     metoprolol succinate (TOPROL-XL) 100 MG 24 hr tablet Take 1 tablet (100 mg total) by mouth daily. Take with or immediately following a meal. 30 tablet 0   Multiple Vitamin (MULTIVITAMIN) tablet Take 1 tablet by mouth daily with breakfast.     ondansetron (ZOFRAN) 4 MG tablet Take 1 tablet (4 mg total) by mouth every 8 (eight) hours as needed for nausea or vomiting. 20 tablet 0   pantoprazole (PROTONIX) 40 MG tablet Take 1 tablet (40 mg total) by mouth 2 (two) times daily. 60 tablet 0   rosuvastatin (CRESTOR) 10 MG tablet Take 10 mg by mouth daily.     saccharomyces boulardii (FLORASTOR) 250 MG capsule Take 250 mg by mouth in the morning.     traMADol (ULTRAM) 50 MG tablet Take 2 tablets (100 mg total) by mouth every 8 (eight) hours as needed for moderate  pain. 20 tablet 0   TURMERIC PO Take 1 capsule by mouth at bedtime.     venlafaxine XR (EFFEXOR-XR) 37.5 MG 24 hr capsule Take 37.5 mg by mouth daily with breakfast.     Accu-Chek Softclix Lancets lancets Use to test blood sugars up to 4 times daily as directed. 100 each 0   Blood Glucose Monitoring Suppl (BLOOD GLUCOSE MONITOR SYSTEM) w/Device KIT Use to test blood sugars up to 4 times daily. 1 kit 0   Continuous Blood Gluc Sensor (FREESTYLE LIBRE 2 SENSOR) MISC Inject 1 Device into the skin every 14 (fourteen) days.     glucose blood (ACCU-CHEK GUIDE) test strip Use to test blood sugars up to 4 times daily as directed. 100 each 0   insulin detemir (LEVEMIR) 100 UNIT/ML FlexPen Inject 10 Units into the skin daily. (Patient not taking: Reported on 11/03/2022) 15 mL 0   Insulin Pen Needle 32G X 4 MM MISC Use to inject insulin up to 4 times daily as directed. 100 each 0   polyethylene glycol powder (GLYCOLAX/MIRALAX) 17 GM/SCOOP powder Take 17 g by mouth daily. (Patient not taking: Reported on 11/03/2022) 238 g 0   No current facility-administered medications for this visit.    REVIEW OF SYSTEMS: Fevers no chills no night sweats Persistent nonproductive cough. Dyspnea on exertion present  PHYSICAL EXAMINATION:  Vitals:   11/03/22 1220  BP: (!) 138/90  Pulse: (!) 123  Resp: 20  Temp: (!) 97.5 F (36.4 C)  SpO2: 97%     GENERAL:alert, in no acute distress and comfortable SKIN: no acute rashes, no significant lesions EYES: conjunctiva are pink and non-injected, sclera anicteric OROPHARYNX: MMM, no exudates, no oropharyngeal erythema or ulceration NECK: supple, no JVD LYMPH:  no palpable lymphadenopathy in the cervical, axillary or inguinal regions LUNGS: clear to auscultation b/l with normal respiratory effort HEART: regular rate & rhythm ABDOMEN:  normoactive bowel sounds , non tender, not distended. Extremity: no pedal edema PSYCH: alert & oriented  x 3 with fluent speech NEURO:  no focal motor/sensory deficits   LABORATORY DATA:  I have reviewed the data as listed     Latest Ref Rng & Units 11/03/2022   11:40 AM 10/09/2022    5:08 AM 10/06/2022    4:12 AM  CBC  WBC 4.0 - 10.5 K/uL 9.3  4.7  3.6   Hemoglobin 12.0 - 15.0 g/dL 16.1  09.6  9.9   Hematocrit 36.0 - 46.0 % 36.7  35.1  32.4   Platelets 150 - 400 K/uL 214  279  244        Latest Ref Rng & Units 11/03/2022   11:40 AM 10/10/2022    4:48 AM 10/09/2022    5:08 AM  CMP  Glucose 70 - 99 mg/dL 045  409  811   BUN 8 - 23 mg/dL 17  12  8    Creatinine 0.44 - 1.00 mg/dL 9.14  7.82  9.56   Sodium 135 - 145 mmol/L 139  137  139   Potassium 3.5 - 5.1 mmol/L 3.9  3.5  3.3   Chloride 98 - 111 mmol/L 98  97  98   CO2 22 - 32 mmol/L 30  29  30    Calcium 8.9 - 10.3 mg/dL 9.6  9.3  9.2   Total Protein 6.5 - 8.1 g/dL 6.3     Total Bilirubin 0.3 - 1.2 mg/dL 0.3     Alkaline Phos 38 - 126 U/L 77     AST 15 - 41 U/L 33     ALT 0 - 44 U/L 24      . Lab Results  Component Value Date   LDH 190 11/03/2022   05/14/2019 CT ANGIO CHEST PE W OR WO CONTRAST (Accession [id number]):   03/08/2019 Lymph Node Biopsy [phone]):      RADIOGRAPHIC STUDIES: I have personally reviewed the radiological images as listed and agreed with the findings in the report. MR THORACIC SPINE WO CONTRAST  Result Date: 10/09/2022 CLINICAL DATA:  Thoracic compression fracture EXAM: MRI THORACIC SPINE WITHOUT CONTRAST TECHNIQUE: Multiplanar, multisequence MR imaging of the thoracic spine was performed. No intravenous contrast was administered. COMPARISON:  10/03/2022 FINDINGS: Evaluation is somewhat limited by motion artifact. Alignment: Unchanged S-shaped curvature of the thoracolumbar spine. Redemonstrated exaggeration of the normal thoracic kyphosis. Unchanged trace anterolisthesis of T2 on T3 and T3 on T4. Vertebrae: Redemonstrated compression fracture at T8, status post interval kyphoplasty. 60% vertebral body height loss and 2 mm  retropulsion of the posteroinferior cortex, unchanged. Unchanged appearance of the inferior endplate of T7, with some edema and minimal height loss, favored to represent a subacute fracture, unchanged. Chronic compression deformity of T12, unchanged. No acute fracture, suspicious osseous lesion, or evidence of discitis. Cord: Normal signal and morphology. Mildly increased signal in the spinal cord at T8 on the sagittal STIR sequence does not have a correlate on the sagittal T2 or axial T2, and is favored to be artifactual. Paraspinal and other soft tissues: Fluid-filled patulous esophagus. Otherwise negative. Disc levels: Multilevel facet arthropathy and degenerative disc disease,, which is described in detail on the prior exam. T8-T9; aforementioned retropulsion of the posteroinferior aspect of T8, facet arthropathy, and epidural lipomatosis contribute to mild thecal sac narrowing. Mild to moderate bilateral neural foraminal narrowing. No other spinal canal stenosis. Redemonstrated mild right neural foraminal narrowing at T2-T3, T3-T4, T4-T5, and T5-T6. IMPRESSION: 1. Evaluation is somewhat limited by motion artifact. Within this limitation, status post interval kyphoplasty at T8,  with unchanged 60% vertebral body height loss and 2 mm retropulsion of the posteroinferior cortex. Unchanged mild thecal sac narrowing at T8-T9. Mild to moderate bilateral neural foraminal narrowing at this level. No definite cord signal abnormality is seen. 2. Unchanged appearance of the inferior endplate of T7, with some edema and minimal height loss, favored to represent a subacute fracture. 3. Unchanged chronic compression deformity of T12. Electronically Signed   By: Wiliam Ke M.D.   On: 10/09/2022 20:20   IR VERTEBROPLASTY CERV/THOR BX INC UNI/BIL INC/INJECT/IMAGING  Result Date: 10/07/2022 INDICATION: Severe low back pain secondary to compression fracture at T8. EXAM: VERTEBROPLASTY AT T8 MEDICATIONS: As antibiotic  prophylaxis, Ancef 2 g IV was ordered pre-procedure and administered intravenously within 1 hour of incision. All current medications are in the EMR and have been reviewed as part of this encounter. ANESTHESIA/SEDATION: Moderate (conscious) sedation was employed during this procedure. A total of Versed 2 mg and Fentanyl 100 mcg was administered intravenously by the radiology nurse. Total intra-service moderate Sedation Time: 58 minutes. The patient's level of consciousness and vital signs were monitored continuously by radiology nursing throughout the procedure under my direct supervision. FLUOROSCOPY: Radiation Exposure Index (as provided by the fluoroscopic device): 901 mGy Kerma COMPLICATIONS: None immediate. TECHNIQUE: Informed written consent was obtained from the patient after a thorough discussion of the procedural risks, benefits and alternatives. All questions were addressed. Maximal Sterile Barrier Technique was utilized including caps, mask, sterile gowns, sterile gloves, sterile drape, hand hygiene and skin antiseptic. A timeout was performed prior to the initiation of the procedure. PROCEDURE: The patient was placed prone on the fluoroscopic table. Nasal oxygen was administered. Physiologic monitoring was performed throughout the duration of the procedure. The skin overlying the thoracolumbar region was prepped and draped in the usual sterile fashion. The T8 vertebral body was identified and the left pedicle was infiltrated with 0.25% Bupivacaine. This was then followed by the advancement of a 13-gauge Cook needle through the left pedicle into the posterior one-third at T8. Two passes were made with a 16 gauge core biopsy needle. Samples were sent off for pathologic analysis as per the request of the patient's physician. The 13 gauge Cook spinal needle was then advanced into the anterior 1/3. A gentle contrast injection demonstrated a trabecular pattern of contrast with opacification of a paraspinal  vein. This necessitated the use of Gelfoam pledgets into the 13 gauge Cook spinal needle to avoid embolization of the paraspinous vein. At this time, methylmethacrylate mixture was reconstituted. Under biplane intermittent fluoroscopy, the methylmethacrylate was then injected into the T8 vertebral body with filling of the vertebral body along the inferior and also the fractured superior endplate. No extravasation was noted into the disk spaces or posteriorly into the spinal canal. No epidural venous contamination was seen. The needle was then removed. Hemostasis was achieved at the skin entry site. There were no acute complications. Patient tolerated the procedure well. The patient was returned to the floor in stable condition. IMPRESSION: 1. Status post vertebral body augmentation for painful compression fracture at T8 using vertebroplasty technique. 2.  Core biopsies were obtained and sent for pathologic analysis. If the patient has known osteoporosis, recommend treatment as clinically indicated. If the patient's bone density status is unknown, DEXA scan is recommended. Electronically Signed   By: Julieanne Cotton M.D.   On: 10/07/2022 08:45   VAS [place] LOWER EXTREMITY VENOUS (DVT)  Result Date: 10/06/2022  Lower Venous DVT Study Patient Name:  [facility]  Date of Exam:   10/06/2022 Medical Rec #: [id number]       Accession #:    [id number] Date of Birth: 11/20/1955        Patient Gender: F Patient Age:   49 years Exam Location:  [facility] Procedure:      VAS [place] LOWER EXTREMITY VENOUS (DVT) Referring Phys: Junious Silk --------------------------------------------------------------------------------  Indications: Edema.  Risk Factors: Cancer Lymphoma Chrohn's disease,. Comparison Study: Previous exam on 09/08/21 was negative for DVT Performing Technologist: Ernestene Mention RVT, RDMS  Examination Guidelines: A complete evaluation includes B-mode imaging, spectral Doppler, color Doppler, and power Doppler as  needed of all accessible portions of each vessel. Bilateral testing is considered an integral part of a complete examination. Limited examinations for reoccurring indications may be performed as noted. The reflux portion of the exam is performed with the patient in reverse Trendelenburg.  +---------+---------------+---------+-----------+----------+-------------------+ RIGHT    CompressibilityPhasicitySpontaneityPropertiesThrombus Aging      +---------+---------------+---------+-----------+----------+-------------------+ CFV      Full           Yes      Yes                                      +---------+---------------+---------+-----------+----------+-------------------+ SFJ      Full                                                             +---------+---------------+---------+-----------+----------+-------------------+ FV Prox  Full           Yes      Yes                                      +---------+---------------+---------+-----------+----------+-------------------+ FV Mid   Full           Yes      Yes                                      +---------+---------------+---------+-----------+----------+-------------------+ FV DistalFull           Yes      Yes                                      +---------+---------------+---------+-----------+----------+-------------------+ PFV      Full                                                             +---------+---------------+---------+-----------+----------+-------------------+ POP      Full           Yes      Yes                                      +---------+---------------+---------+-----------+----------+-------------------+  PTV      Full                                                             +---------+---------------+---------+-----------+----------+-------------------+ PERO     Full                                         Not well visualized  +---------+---------------+---------+-----------+----------+-------------------+   +---------+---------------+---------+-----------+----------+-------------------+ LEFT     CompressibilityPhasicitySpontaneityPropertiesThrombus Aging      +---------+---------------+---------+-----------+----------+-------------------+ CFV      Full           Yes      Yes                                      +---------+---------------+---------+-----------+----------+-------------------+ SFJ      Full                                                             +---------+---------------+---------+-----------+----------+-------------------+ FV Prox  Full           Yes      Yes                                      +---------+---------------+---------+-----------+----------+-------------------+ FV Mid   Full           Yes      Yes                                      +---------+---------------+---------+-----------+----------+-------------------+ FV DistalFull           Yes      Yes                                      +---------+---------------+---------+-----------+----------+-------------------+ PFV      Full                                                             +---------+---------------+---------+-----------+----------+-------------------+ POP      Full           Yes      Yes                                      +---------+---------------+---------+-----------+----------+-------------------+ PTV      Full                                                             +---------+---------------+---------+-----------+----------+-------------------+  PERO     Full                                         Not well visualized +---------+---------------+---------+-----------+----------+-------------------+     Summary: BILATERAL: - No evidence of deep vein thrombosis seen in the lower extremities, bilaterally. - RIGHT: - A large cystic structure with internal  calcifications is found in the popliteal fossa (6.8 x 1.0 x 4.8 cm).  LEFT: - No cystic structure found in the popliteal fossa.  *See table(s) above for measurements and observations. Electronically signed by Sherald Hess MD on 10/06/2022 at 1:24:52 PM.    Final    ECHOCARDIOGRAM COMPLETE  Result Date: 10/06/2022    ECHOCARDIOGRAM REPORT   Patient Name:   JINGER Pak Date of Exam: 10/06/2022 Medical Rec #:  [id number]      Height:       62.0 in Accession #:    [id number]     Weight:       176.4 lb Date of Birth:  [birth year]       BSA:          1.812 m Patient Age:    67 years       BP:           162/96 mmHg Patient Gender: F              HR:           101 bpm. Exam Location:  Inpatient Procedure: 2D Echo, Cardiac Doppler, Color Doppler and Intracardiac            Opacification Agent Indications:    Edema  History:        Patient has prior history of Echocardiogram examinations, most                 recent 10/08/2021. Arrythmias:Atrial Fibrillation; Risk                 Factors:Diabetes and Hypertension. ILD, lymphoma.  Sonographer:    Milda Smart Referring Phys: [id number] ALLISON L ELLIS  Sonographer Comments: Image acquisition challenging due to patient body habitus and Image acquisition challenging due to respiratory motion. IMPRESSIONS  1. Left ventricular ejection fraction, by estimation, is 55 to 60%. The left ventricle has normal function. The left ventricle has no regional wall motion abnormalities. Indeterminate diastolic filling due to E-A fusion.  2. Right ventricular systolic function is normal. The right ventricular size is normal.  3. The mitral valve is normal in structure. Mild mitral valve regurgitation. No evidence of mitral stenosis.  4. The aortic valve is normal in structure. Aortic valve regurgitation is not visualized. No aortic stenosis is present.  5. The inferior vena cava is normal in size with greater than 50% respiratory variability, suggesting right atrial pressure of 3 mmHg.  FINDINGS  Left Ventricle: Left ventricular ejection fraction, by estimation, is 55 to 60%. The left ventricle has normal function. The left ventricle has no regional wall motion abnormalities. The left ventricular internal cavity size was normal in size. There is  no left ventricular hypertrophy. Indeterminate diastolic filling due to E-A fusion. Right Ventricle: The right ventricular size is normal. No increase in right ventricular wall thickness. Right ventricular systolic function is normal. Left Atrium: Left atrial size was normal in size. Right Atrium: Right atrial size was normal in size. Pericardium: There is no evidence of pericardial  effusion. Mitral Valve: The mitral valve is normal in structure. There is mild calcification of the mitral valve leaflet(s). Mild mitral valve regurgitation. No evidence of mitral valve stenosis. Tricuspid Valve: The tricuspid valve is normal in structure. Tricuspid valve regurgitation is not demonstrated. No evidence of tricuspid stenosis. Aortic Valve: The aortic valve is normal in structure. Aortic valve regurgitation is not visualized. No aortic stenosis is present. Pulmonic Valve: The pulmonic valve was not well visualized. Pulmonic valve regurgitation is not visualized. No evidence of pulmonic stenosis. Aorta: The aortic root is normal in size and structure. Venous: The inferior vena cava is normal in size with greater than 50% respiratory variability, suggesting right atrial pressure of 3 mmHg. IAS/Shunts: No atrial level shunt detected by color flow Doppler.  LEFT VENTRICLE PLAX 2D LVIDd:         4.10 cm     Diastology LVIDs:         3.20 cm     LV e' medial:    5.11 cm/s LV PW:         0.90 cm     LV E/e' medial:  22.1 LV IVS:        0.70 cm     LV e' lateral:   7.29 cm/s LVOT diam:     2.30 cm     LV E/e' lateral: 15.5 LV SV:         70 LV SV Index:   39 LVOT Area:     4.15 cm  LV Volumes (MOD) LV vol d, MOD A2C: 86.1 ml LV vol d, MOD A4C: 84.8 ml LV vol s, MOD A2C:  39.0 ml LV vol s, MOD A4C: 38.6 ml LV SV MOD A2C:     47.1 ml LV SV MOD A4C:     84.8 ml LV SV MOD BP:      46.5 ml RIGHT VENTRICLE RV S prime:     9.25 cm/s TAPSE (M-mode): 1.3 cm LEFT ATRIUM             Index        RIGHT ATRIUM          Index LA diam:        3.80 cm 2.10 cm/m   RA Area:     7.52 cm LA Vol (A2C):   27.8 ml 15.34 ml/m  RA Volume:   12.90 ml 7.12 ml/m LA Vol (A4C):   29.3 ml 16.17 ml/m LA Biplane Vol: 30.4 ml 16.78 ml/m  AORTIC VALVE LVOT Vmax:   89.40 cm/s LVOT Vmean:  71.800 cm/s LVOT VTI:    0.168 m  AORTA Ao Root diam: 3.00 cm Ao Asc diam:  3.70 cm MITRAL VALVE MV Area (PHT): 4.80 cm     SHUNTS MV Decel Time: 158 msec     Systemic VTI:  0.17 m MR Peak grad: 89.1 mmHg     Systemic Diam: 2.30 cm MR Vmax:      472.00 cm/s MV E velocity: 113.00 cm/s Arvilla Meres MD Electronically signed by Arvilla Meres MD Signature Date/Time: 10/06/2022/11:19:14 AM    Final    CT Angio Chest PE W/Cm &/Or Wo Cm  Result Date: 10/05/2022 CLINICAL DATA:  Pulmonary embolism (PE) suspected, high prob. Back pain. EXAM: CT ANGIOGRAPHY CHEST WITH CONTRAST TECHNIQUE: Multidetector CT imaging of the chest was performed using the standard protocol during bolus administration of intravenous contrast. Multiplanar CT image reconstructions and MIPs were obtained to evaluate the vascular anatomy. RADIATION DOSE REDUCTION:  This exam was performed according to the departmental dose-optimization program which includes automated exposure control, adjustment of the mA and/or kV according to patient size and/or use of iterative reconstruction technique. CONTRAST:  75mL OMNIPAQUE IOHEXOL 350 MG/ML SOLN COMPARISON:  MRI 10/03/2022 FINDINGS: Cardiovascular: No filling defects in the pulmonary arteries to suggest pulmonary emboli. Heart is normal size. Aorta is normal caliber. Coronary artery and aortic calcifications. Mediastinum/Nodes: No mediastinal, hilar, or axillary adenopathy. Esophagus is patulous, dilated and  fluid-filled. This is unchanged since prior study. Trachea and thyroid unremarkable. Lungs/Pleura: Medial left apical nodule again noted measuring approximately 9 mm compared to 10 mm previously. Linear densities in the lingula are stable, likely scarring. No acute confluent opacities or effusions. 7 mm inferior lingular nodule on image 80 is unchanged since prior study. Upper Abdomen: Calcifications in the spleen and liver compatible with old granulomatous disease. No acute findings. Musculoskeletal: Chest wall soft tissues are unremarkable. Bilateral breast implants grossly unremarkable. Moderate to severe compression fracture noted at T8 as seen on prior MRI, unchanged. Stable mild compression fracture through the superior endplate of T12. Review of the MIP images confirms the above findings. IMPRESSION: No evidence of pulmonary embolus. Stable left upper lobe nodules measuring up to 9 mm. Consider follow-up imaging in 6-12 months to assess stability. Patulous, fluid-filled esophagus is unchanged. Coronary artery disease. No acute cardiopulmonary disease. Aortic Atherosclerosis (ICD10-I70.0). Electronically Signed   By: Charlett Nose M.D.   On: 10/05/2022 00:09   DG Chest Port 1 View  Result Date: 10/04/2022 CLINICAL DATA:  Shortness of breath. EXAM: PORTABLE CHEST 1 VIEW COMPARISON:  10/02/2022, CT 09/26/2022 FINDINGS: Improved lung aeration. Stable heart size and mediastinal contours. Minor atelectasis at the left lung base, no confluent consolidation. No pleural fluid or pneumothorax. No pulmonary edema. Left apical nodule on CT not seen by radiograph IMPRESSION: Minor left basilar atelectasis. Electronically Signed   By: Narda Rutherford M.D.   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 Spondylosis worst at L4-5 where there is narrowing in the right subarticular recess predominantly due to a synovial cyst off the medial margin of the right facet with encroachment on the descending right L5 root. There is mild central canal narrowing overall at this Level."  12/04/2018 CT CAP revealed "Mild-to-moderate abdominal and pelvic lymphadenopathy and new 9 mm left superior mediastinal lymph node, highly suspicious for lymphoproliferative disorder. Stable 8 mm lingular pulmonary nodule, consistent with benign Etiology. Moderate hepatic steatosis. 1.3 cm indeterminate low-attenuation lesion in the right lobe. Recommend continued attention on follow-up CT."  12/18/2018 lymph node biopsy pathology revealing a follicular lymphoma- likely low grade but accurate grading not possible on limited sampling.  02/06/2019 PET/CT scan ([id number]) revealed "1. Bulky hypermetabolic lymphadenopathy in the abdomen and upper pelvis, as described. This is associated with mild hypermetabolic lymphadenopathy in the upper mediastinum and right axilla. Hypermetabolic lymphadenopathy represents a combination of Deauville 4 and Deauville 5 disease. 2. 8 mm nodule in the lingula without hypermetabolism. Attention on follow-up recommended. 3. Hepatic steatosis. 4.  Aortic Atherosclerois (ICD10-170.0)."  03/08/2019 Lymph Node Biopsy ([id number]) revealed "LYMPH NODE, NEEDLE CORE BIOPSY: - Follicular lymphoma (grade 1-2 of 3) with elevated proliferation rate."  2. Mild treatment related thrombocytopenia- resolved.   3.  H/o Melena.  2 episodes about  a month ago.  Hemoglobin stable today. Previously had upper abdominal discomfort which has resolved. Has some chronic nausea due to achalasia.  4.  Chronic ALT elevation -likely from her fatty liver. Plan -Follow-up with PCP and GI for management of hepatic steatosis/steatohepatitis. -Avoid hepatotoxic  medications. -Recommended optimal diet and exercise and to optimize control of diabetes with PCP.  5. DM2 -following with PCP  6.  Bilateral Extensive multifocal groundglass opacities with interstitial thickening./Interstitial lung disease following with Dr. Francine Graven from pulmonary. Currently on steroid taper and is off home oxygen.  Plan:  -Discussed lab results on 11/03/2022 in detail with patient. CBC normal, showed WBC of 9.3K, hemoglobin improved to 11.9, and platelets of 214K. -CMP stable -LDH previously elevated likely due to fracture in bone, LDH normalized at this time -patient received vertebroplasty at T8 on 10/05/2022 -discussed results of October 2023 scan which showed previous airspace changes resolved and no lymph nodes present -CT chest/abd/pelvis scan from 09/26/2022 showed no evidence of aortic aneurysm or dissection. T8 and T12 compression deformities are better evaluated on same day CT thoracic spine., No pulmonary emboli, no thyroid nodules, New medial left apical irregular nodule measuring 1.0 x 0.4 cm,likely infectious/inflammatory  -discussed results of 10/04/2022 CT chest scan which showed no evidence of PE and Stable left upper lobe nodules measuring up to 9 mm. It was recommended to monitor 9mm nodule with imaging in 6-12 months.  -Discussed results of 10/09/2022 MRI -Bone biopsy T8 showed benign changes -Patient has no clinical symptoms suggestive of progression of her follicular lymphoma at this time. -[place] of thyroid and non contrast CT chest scan in 4-5 months -Continue to follow up with gastroenterologist and pulmonologist.  -patient reports that she has a scheduled visit with interventional radiology tomorrow  FOLLOW-UP: Ct chest w/o contrast last week of November 2024 US thyroid last week of November 2024 RTC with Dr Candise Che with labs in 1st week of December 2024  The total time spent in the appointment was 21 minutes* .  All of the patient's questions were  answered with apparent satisfaction. The patient knows to call the clinic with any problems, questions or concerns.   Wyvonnia Lora MD MS AAHIVMS [facility] Hematology/Oncology Physician [facility]  .*Total Encounter Time as defined by the Centers for Medicare and Medicaid Services includes, in addition to the face-to-face time of a patient visit (documented in the note above) non-face-to-face time: obtaining and reviewing outside history, ordering and reviewing medications, tests or procedures, care coordination (communications with other health care professionals or caregivers) and documentation in the medical record.    I,Mitra Faeizi,acting as a Neurosurgeon for Wyvonnia Lora, MD.,have documented all relevant documentation on the behalf of Wyvonnia Lora, MD,as directed by  Wyvonnia Lora, MD while in the presence of Wyvonnia Lora, MD.  .I have reviewed the above documentation for accuracy and completeness, and I agree with the above. Johney Maine MD

## 2022-11-04 ENCOUNTER — Ambulatory Visit (HOSPITAL_COMMUNITY)
Admission: RE | Admit: 2022-11-04 | Discharge: 2022-11-04 | Disposition: A | Discharge status: Home / Self Care | Payer: Medicare Other | Source: Ambulatory Visit | Attending: Interventional Radiology | Admitting: Interventional Radiology

## 2022-11-04 DIAGNOSIS — M549 Dorsalgia, unspecified: Secondary | ICD-10-CM

## 2022-11-08 ENCOUNTER — Other Ambulatory Visit (HOSPITAL_COMMUNITY): Payer: Self-pay | Admitting: Interventional Radiology

## 2022-11-08 ENCOUNTER — Telehealth (HOSPITAL_COMMUNITY): Payer: Self-pay

## 2022-11-08 DIAGNOSIS — S22060A Wedge compression fracture of T7-T8 vertebra, initial encounter for closed fracture: Secondary | ICD-10-CM

## 2022-11-08 HISTORY — PX: IR RADIOLOGIST EVAL & MGMT: IMG5224

## 2022-11-08 NOTE — Telephone Encounter (Signed)
Called to schedule KP, no answer, left vm. AB

## 2022-11-09 ENCOUNTER — Encounter: Payer: Self-pay | Admitting: Hematology

## 2022-11-17 ENCOUNTER — Telehealth (HOSPITAL_COMMUNITY): Payer: Self-pay | Admitting: Radiology

## 2022-11-17 NOTE — Telephone Encounter (Signed)
Called pt to schedule T7 KP/VP with Dr. Corliss Skains. No answer, left VM. JM

## 2022-11-21 ENCOUNTER — Other Ambulatory Visit: Payer: Self-pay | Admitting: Physician Assistant

## 2022-11-21 DIAGNOSIS — Z01818 Encounter for other preprocedural examination: Secondary | ICD-10-CM

## 2022-11-22 ENCOUNTER — Ambulatory Visit (HOSPITAL_COMMUNITY)
Admission: RE | Admit: 2022-11-22 | Discharge: 2022-11-22 | Disposition: A | Discharge status: Home / Self Care | Payer: Medicare Other | Source: Ambulatory Visit | Attending: Interventional Radiology | Admitting: Interventional Radiology

## 2022-11-22 ENCOUNTER — Encounter (HOSPITAL_COMMUNITY): Payer: Self-pay

## 2022-11-22 ENCOUNTER — Other Ambulatory Visit: Payer: Self-pay

## 2022-11-22 DIAGNOSIS — M546 Pain in thoracic spine: Secondary | ICD-10-CM | POA: Insufficient documentation

## 2022-11-22 DIAGNOSIS — S22060A Wedge compression fracture of T7-T8 vertebra, initial encounter for closed fracture: Secondary | ICD-10-CM | POA: Insufficient documentation

## 2022-11-22 DIAGNOSIS — Z01818 Encounter for other preprocedural examination: Secondary | ICD-10-CM

## 2022-11-22 DIAGNOSIS — X58XXXA Exposure to other specified factors, initial encounter: Secondary | ICD-10-CM | POA: Diagnosis not present

## 2022-11-22 HISTORY — PX: IR KYPHO THORACIC WITH BONE BIOPSY: IMG5518

## 2022-11-22 LAB — BASIC METABOLIC PANEL
Anion gap: 11 (ref 5–15)
BUN: 12 mg/dL (ref 8–23)
CO2: 29 mmol/L (ref 22–32)
Calcium: 9.6 mg/dL (ref 8.9–10.3)
Chloride: 98 mmol/L (ref 98–111)
Creatinine, Ser: 0.97 mg/dL (ref 0.44–1.00)
GFR, Estimated: >60 mL/min (ref >60)
Glucose, Bld: 152 mg/dL — ABNORMAL HIGH (ref 70–99)
Potassium: 2.9 mmol/L — ABNORMAL LOW (ref 3.5–5.1)
Sodium: 138 mmol/L (ref 135–145)

## 2022-11-22 LAB — CBC
HCT: 36 % (ref 36.0–46.0)
Hemoglobin: 11.8 g/dL — ABNORMAL LOW (ref 12.0–15.0)
MCH: 26.8 pg (ref 26.0–34.0)
MCHC: 32.8 g/dL (ref 30.0–36.0)
MCV: 81.8 fL (ref 80.0–100.0)
Platelets: 234 10*3/uL (ref 150–400)
RBC: 4.4 MIL/uL (ref 3.87–5.11)
RDW: 14.3 % (ref 11.5–15.5)
WBC: 9.8 10*3/uL (ref 4.0–10.5)
nRBC: 0 % (ref 0.0–0.2)

## 2022-11-22 LAB — GLUCOSE, CAPILLARY
Glucose-Capillary: 144 mg/dL — ABNORMAL HIGH (ref 70–99)
Glucose-Capillary: 122 mg/dL — ABNORMAL HIGH (ref 70–99)

## 2022-11-22 LAB — PROTIME-INR
INR: 1 (ref 0.8–1.2)
Prothrombin Time: 13.5 seconds (ref 11.4–15.2)

## 2022-11-22 MED ORDER — ONDANSETRON HCL 4 MG/2ML IJ SOLN
INTRAMUSCULAR | Status: AC | PRN
  Administered 2022-11-22: 4 mg via INTRAVENOUS

## 2022-11-22 MED ORDER — MIDAZOLAM HCL 2 MG/2ML IJ SOLN
INTRAMUSCULAR | Status: AC | PRN
  Administered 2022-11-22: 1 mg via INTRAVENOUS
  Administered 2022-11-22: .5 mg via INTRAVENOUS
  Administered 2022-11-22: 1 mg via INTRAVENOUS

## 2022-11-22 MED ORDER — FENTANYL CITRATE (PF) 100 MCG/2ML IJ SOLN
INTRAMUSCULAR | Status: AC
  Filled 2022-11-22: qty 2

## 2022-11-22 MED ORDER — ONDANSETRON HCL 4 MG/2ML IJ SOLN
INTRAMUSCULAR | Status: AC
  Filled 2022-11-22: qty 2

## 2022-11-22 MED ORDER — POTASSIUM CHLORIDE CRYS ER 20 MEQ PO TBCR
40.0000 meq | EXTENDED_RELEASE_TABLET | Freq: Once | ORAL | Status: AC
  Administered 2022-11-22: 40 meq via ORAL
  Filled 2022-11-22 (×3): qty 2

## 2022-11-22 MED ORDER — SODIUM CHLORIDE 0.9 % IV SOLN: INTRAVENOUS | Status: DC

## 2022-11-22 MED ORDER — HYDROMORPHONE HCL 1 MG/ML IJ SOLN
INTRAMUSCULAR | Status: AC | PRN
Start: 2022-11-22 — End: 2022-11-22
  Administered 2022-11-22: 1 mg via INTRAVENOUS

## 2022-11-22 MED ORDER — FENTANYL CITRATE (PF) 100 MCG/2ML IJ SOLN
INTRAMUSCULAR | Status: AC | PRN
  Administered 2022-11-22 (×3): 25 ug via INTRAVENOUS

## 2022-11-22 MED ORDER — BUPIVACAINE HCL (PF) 0.25 % IJ SOLN
INTRAMUSCULAR | Status: AC
  Filled 2022-11-22: qty 30

## 2022-11-22 MED ORDER — HYDROMORPHONE HCL 1 MG/ML IJ SOLN
INTRAMUSCULAR | Status: AC
  Filled 2022-11-22: qty 1

## 2022-11-22 MED ORDER — MIDAZOLAM HCL 2 MG/2ML IJ SOLN
INTRAMUSCULAR | Status: AC
  Filled 2022-11-22: qty 2

## 2022-11-22 MED ORDER — TOBRAMYCIN SULFATE 1.2 G IJ SOLR
INTRAMUSCULAR | Status: AC
  Filled 2022-11-22: qty 1.2

## 2022-11-22 MED ORDER — CEFAZOLIN SODIUM-DEXTROSE 2-4 GM/100ML-% IV SOLN
2.0000 g | INTRAVENOUS | Status: AC
  Administered 2022-11-22: 2 g via INTRAVENOUS

## 2022-11-22 MED ORDER — BUPIVACAINE HCL (PF) 0.25 % IJ SOLN
30.0000 mL | Freq: Once | INTRAMUSCULAR | Status: AC
  Administered 2022-11-22: 10 mL

## 2022-11-22 MED ORDER — SODIUM CHLORIDE 0.9 % IV SOLN: INTRAVENOUS | Status: AC

## 2022-11-22 MED ORDER — CEFAZOLIN SODIUM-DEXTROSE 2-4 GM/100ML-% IV SOLN
INTRAVENOUS | Status: AC
  Filled 2022-11-22: qty 100

## 2022-11-22 MED ORDER — IOHEXOL 300 MG/ML  SOLN
50.0000 mL | Freq: Once | INTRAMUSCULAR | Status: AC | PRN
  Administered 2022-11-22: 25 mL

## 2022-11-22 NOTE — Discharge Instructions (Addendum)
INR.  1..  No stooping, bending, or lifting weights above 10 pounds for 2 weeks.  2.  Use walker to ambulate for 2 weeks.  3.  No driving for 3 weeks.  4. See PRN  2 weeks.

## 2022-11-22 NOTE — Procedures (Signed)
INR.  Status post  balloon kyphoplasty at T7 VB.  Right-sided transpedicular approach.  No acute complications.  Patient tolerated the procedure well.  Fatima Sanger MD.

## 2022-11-22 NOTE — H&P (Signed)
Chief Complaint: Patient was seen in consultation today for Thoracic 7 Kyphopasty at the request of  Dr Peterson Lombard  Supervising Physician: Julieanne Cotton  Patient Status: [facility] - Out-pt  History of Present Illness: Meghan Alexander is a 67 y.o. female   FULL Code status per pt Known to NIR T8 KP 09/2022 Pt tolerated well  Was able to have physical and occupational therapy Follow up consultation with Dr Corliss Skains 11/04/22: Still with pain 7/10 Feels best lying flat Tramadol does give her partial relief  7/12:  Patient's repeat MRI of the thoracic spine of June 16 demonstrates previous postprocedural changes at T8 stable. However, over the interval, there has been decrease of the T7 vertebral body height anteriorly with increased FLAIR and T2 signal at T7 along the inferior endplate indicative of a compression fracture. Given the above neuroimaging findings, and the clinical examination and history, for pain relief and to prevent further collapse at T7, vertebrobasilar augmentation with either a vertebroplasty or Kyphoplasty. 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   GERD (gastroesophageal reflux disease)    hx of reflux-went away with elevating HOB    Hypertension    Pneumonia    hx of    PONV (postoperative nausea and vomiting)    Type 2 diabetes mellitus (HCC) 08/02/2021    Past Surgical History:  Procedure Laterality Date   ABDOMINAL HYSTERECTOMY     ADENOIDECTOMY     BIOPSY  03/10/2022   Procedure: BIOPSY;  Surgeon: Vida Rigger, MD;  Location: [facility] ENDOSCOPY;  Service: Gastroenterology;;   BRONCHIAL BRUSHINGS  08/03/2021   Procedure: BRONCHIAL BRUSHINGS;   Surgeon: Leslye Peer, MD;  Location: [facility] ENDOSCOPY;  Service: Cardiopulmonary;;   BRONCHIAL WASHINGS  08/03/2021   Procedure: BRONCHIAL WASHINGS;  Surgeon: Leslye Peer, MD;  Location: [facility] ENDOSCOPY;  Service: Cardiopulmonary;;   cosmetic surgeries     ESOPHAGEAL MANOMETRY N/A 12/02/2015   Procedure: ESOPHAGEAL MANOMETRY (EM);  Surgeon: Willis Modena, MD;  Location: [facility] ENDOSCOPY;  Service: Endoscopy;  Laterality: N/A;   ESOPHAGOGASTRODUODENOSCOPY N/A 12/02/2015   Procedure: ESOPHAGOGASTRODUODENOSCOPY (EGD);  Surgeon: Willis Modena, MD;  Location: [facility] ENDOSCOPY;  Service: Endoscopy;  Laterality: N/A;   FLEXIBLE SIGMOIDOSCOPY N/A 03/10/2022   Procedure: FLEXIBLE SIGMOIDOSCOPY;  Surgeon: Vida Rigger, MD;  Location: [facility] ENDOSCOPY;  Service: Gastroenterology;  Laterality: N/A;   IR RADIOLOGIST EVAL & MGMT  11/08/2022   IR VERTEBROPLASTY CERV/THOR BX INC UNI/BIL INC/INJECT/IMAGING  10/05/2022   lymph node removal left leg     cat scratch surgery    ORIF ANKLE FRACTURE Right 09/03/2021   Procedure: OPEN REDUCTION INTERNAL FIXATION (ORIF) ANKLE FRACTURE;  Surgeon: Joen Laura, MD;  Location: [facility] OR;  Service: Orthopedics;  Laterality: Right;   THROAT SURGERY     sleep apnea surgery    TONSILLECTOMY     VIDEO BRONCHOSCOPY  08/03/2021   Procedure: VIDEO BRONCHOSCOPY WITH FLUORO;  Surgeon: Leslye Peer, MD;  Location: [facility] ENDOSCOPY;  Service: Cardiopulmonary;;    Allergies: Latex, Codeine, Atenolol, Other, Hydrocodone, and Oxycodone  Medications: Prior to Admission medications   Medication Sig Start Date End Date Taking? Authorizing Provider  Accu-Chek  Softclix Lancets lancets Use to test blood sugars up to 4 times daily as directed. 08/09/21   Drema Dallas, MD  BENADRYL ALLERGY 25 MG tablet Take 25 mg by mouth at bedtime.    [provider]  Blood Glucose Monitoring Suppl (BLOOD GLUCOSE MONITOR SYSTEM) w/Device KIT Use to test blood sugars up to 4 times daily. 08/09/21   Drema Dallas, MD  Continuous Blood Gluc Sensor (FREESTYLE LIBRE 2 SENSOR) MISC Inject 1 Device into the skin every 14 (fourteen) days.    [provider]  CYLTEZO-CD/UC/HS STARTER 40 MG/0.8ML AJKT Inject 160 mLs into the skin every 14 (fourteen) days. Started 09/10/2022. 09/09/22   [provider]  glucose blood (ACCU-CHEK GUIDE) test strip Use to test blood sugars up to 4 times daily as directed. 08/09/21   Drema Dallas, MD  HUMALOG KWIKPEN 100 UNIT/ML KwikPen Inject 5 Units into the skin with breakfast, with lunch, and with evening meal. Patient taking differently: Inject 15 Units into the skin See admin instructions. Inject 15 units into the skin before breakfast and an additional 15 units once a day as needed for elevated BGL 10/11/21   Littie Deeds, MD  insulin detemir (LEVEMIR) 100 UNIT/ML FlexPen Inject 10 Units into the skin daily. Patient not taking: Reported on 11/03/2022 10/11/21   Littie Deeds, MD  Insulin Pen Needle 32G X 4 MM MISC Use to inject insulin up to 4 times daily as directed. 08/09/21   Drema Dallas, MD  lidocaine (LIDODERM) 5 % Place 1 patch onto the skin daily. Remove & Discard patch within 12 hours or as directed by MD 10/12/22   Burnadette Pop, MD  losartan (COZAAR) 50 MG tablet Take 1 tablet (50 mg total) by mouth daily. 10/12/22   Burnadette Pop, MD  meclizine (ANTIVERT) 25 MG tablet Take 12.5 mg by mouth 4 (four) times daily. 10/13/22   [provider]  Melatonin 10 MG TABS Take 10 mg by mouth at bedtime.    [provider]  metFORMIN (GLUCOPHAGE) 500 MG tablet Take 500 mg by mouth 2 (two) times daily with a meal. 07/23/19   [provider]  metoprolol succinate (TOPROL-XL) 100 MG 24 hr tablet Take 1 tablet (100 mg total) by mouth daily. Take with or immediately following a meal. 10/12/22   Burnadette Pop, MD  Multiple Vitamin (MULTIVITAMIN) tablet Take 1 tablet by mouth daily with breakfast.    [provider]  ondansetron  (ZOFRAN) 4 MG tablet Take 1 tablet (4 mg total) by mouth every 8 (eight) hours as needed for nausea or vomiting. 10/03/22   Tilden Fossa, MD  pantoprazole (PROTONIX) 40 MG tablet Take 1 tablet (40 mg total) by mouth 2 (two) times daily. 10/11/22   Burnadette Pop, MD  polyethylene glycol powder (GLYCOLAX/MIRALAX) 17 GM/SCOOP powder Take 17 g by mouth daily. Patient not taking: Reported on 11/03/2022 10/12/22   Burnadette Pop, MD  rosuvastatin (CRESTOR) 10 MG tablet Take 10 mg by mouth daily.    [provider]  saccharomyces boulardii (FLORASTOR) 250 MG capsule Take 250 mg by mouth in the morning.    [provider]  traMADol (ULTRAM) 50 MG tablet Take 2 tablets (100 mg total) by mouth every 8 (eight) hours as needed for moderate pain. 10/11/22   Burnadette Pop, MD  TURMERIC PO Take 1 capsule by mouth at bedtime.    [provider]  venlafaxine XR (EFFEXOR-XR) 37.5 MG 24 hr capsule Take 37.5 mg by  mouth daily with breakfast.    [provider]     Family History  Problem Relation Age of Onset   Lung cancer Father        1982 died from it   Hernia Mother    Breast cancer Maternal Aunt        multiple    Breast cancer Maternal Grandmother    Breast cancer Paternal Grandmother    Breast cancer Cousin        cousins multiple    Colon cancer Neg Hx    Colon polyps Neg Hx    Rectal cancer Neg Hx    Stomach cancer Neg Hx     Social History   Socioeconomic History   Marital status: Married    Spouse name: Not on file   Number of children: Not on file   Years of education: Not on file   Highest education level: Not on file  Occupational History   Not on file  Tobacco Use   Smoking status: Never   Smokeless tobacco: Never  Vaping Use   Vaping status: Never Used  Substance and Sexual Activity   Alcohol use: No    Alcohol/week: 0.0 standard drinks of alcohol   Drug use: No   Sexual activity: Not on file  Other Topics Concern   Not on file   Social History Narrative   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, newspaper at [employer]   Never smoker never drinker   No drugs   Multiple allergies   Lives at home with her husband of 8 years since 2008   Social Determinants of Health   Financial Resource Strain: Not on file  Food Insecurity: No Food Insecurity (10/05/2022)   Hunger Vital Sign    Worried About Running Out of Food in the Last Year: Never true    Ran Out of Food in the Last Year: Never true  Transportation Needs: No Transportation Needs (10/05/2022)   PRAPARE - Administrator, Civil Service (Medical): No    Lack of Transportation (Non-Medical): No  Physical Activity: Not on file  Stress: Not on file  Social Connections: Not on file    Review of Systems: A 12 point ROS discussed and pertinent positives are indicated in the HPI above.  All other systems are negative.  Review of Systems  Constitutional:  Positive for activity change. Negative for fatigue and fever.  Respiratory:  Negative for cough and shortness of breath.   Cardiovascular:  Negative for chest pain.  Gastrointestinal:  Negative for abdominal pain.  Musculoskeletal:  Positive for back pain and gait problem.  Neurological:  Positive for weakness.  Psychiatric/Behavioral:  Negative for behavioral problems and confusion.     Vital Signs: BP (!) 147/82   Pulse (!) 108   Temp 98.2 F (36.8 C) (Temporal)   Resp 18   Ht 5\' 2"  (1.575 m)   Wt 165 lb (74.8 kg)   SpO2 95%   BMI 30.18 kg/m   Advance Care Plan: The advanced care plan/surrogate decision maker was discussed at the time of visit and documented in the medical record.    Physical Exam Vitals reviewed.  HENT:     Mouth/Throat:     Mouth: Mucous membranes are moist.  Cardiovascular:     Rate and Rhythm: Normal rate and regular rhythm.     Heart sounds: Normal heart sounds.  Pulmonary:     Effort: Pulmonary effort is normal.  Breath sounds: Normal breath  sounds. No wheezing.  Abdominal:     Palpations: Abdomen is soft.  Musculoskeletal:        General: Normal range of motion.     Comments: Low back pain  Skin:    General: Skin is warm.  Neurological:     Mental Status: She is alert and oriented to person, place, and time.  Psychiatric:        Behavior: Behavior normal.     Imaging: IR Radiologist Eval & Mgmt  Result Date: 11/08/2022 EXAM: ESTABLISHED PATIENT OFFICE VISIT CHIEF COMPLAINT: Postprocedural follow-up. Current Pain Level: 1-10 HISTORY OF PRESENT ILLNESS: 67 year old lady who is status post uneventful T8 vertebral body augmentation for painful compression fracture at T8 on 10/05/2022. The patient subsequently underwent physical therapy and occupational therapy as an inpatient including speech therapy. The patient returns today for follow-up. Patient reports some alleviation of pain following the procedure. At the present time she reports with pain medications, her pain is a 7/10 after ambulation. The pain is described as a sharp shooting pain in the midthoracic region which is relieved with patient lying down. The pain is worsened with standing and walking. She reports she still has to take tramadol for symptomatic partial relief. The epicenter of the pain continues to be in the midthoracic region the site of previous fracture with intermittent circumferential radiation across the chest bilaterally. There is no radiation into the lower back, or into the lower extremities or the cervical spine. She denies any autonomic dysfunction of her bowel or bladder. She denies recent chills, fever or rigors. She reports her appetite to be somewhat stable. Denies any recent nausea, vomiting, abdominal pain, diarrhea or melena. Diagnosis * : Date . * : Allergy * : * : year around allergies . * : Arthritis * : * : fingers, knees, neck, back-osteoarthristis . * : Bronchitis * : * : hx of . * : Complication of anesthesia * : . * : Follicular lymphoma  grade II of lymph nodes of multiple sites (HCC) * : 04/02/2019 . * : GERD (gastroesophageal reflux disease) * : * : hx of reflux-went away with elevating HOB . * : Hypertension * : . * : Pneumonia * : * : hx of . * : PONV (postoperative nausea and vomiting) * : . * : Type 2 diabetes mellitus (HCC) * : 08/02/2021 : Past Surgical History: Procedure * : Laterality * : Date . * : ABDOMINAL HYSTERECTOMY * : * : . * : ADENOIDECTOMY * : * : . * : BIOPSY * : * : 03/10/2022 * : Procedure: BIOPSY; Surgeon: Vida Rigger, MD; Location: [facility] ENDOSCOPY; Service: Gastroenterology;; . * : BRONCHIAL BRUSHINGS * : * : 08/03/2021 * : Procedure: BRONCHIAL BRUSHINGS; Surgeon: Leslye Peer, MD; Location: [facility] ENDOSCOPY; Service: Cardiopulmonary;; . * : BRONCHIAL WASHINGS * : * : 08/03/2021 * : Procedure: BRONCHIAL WASHINGS; Surgeon: Leslye Peer, MD; Location: [facility] ENDOSCOPY; Service: Cardiopulmonary;; . * : cosmetic surgeries * : * : . * : ESOPHAGEAL MANOMETRY * : N/A * : 12/02/2015 * : Procedure: ESOPHAGEAL MANOMETRY (EM); Surgeon: Willis Modena, MD; Location: [facility] ENDOSCOPY; Service: Endoscopy; Laterality: N/A; . * : ESOPHAGOGASTRODUODENOSCOPY * : N/A * : 12/02/2015 * : Procedure: ESOPHAGOGASTRODUODENOSCOPY (EGD); Surgeon: Willis Modena, MD; Location: [facility] ENDOSCOPY; Service: Endoscopy; Laterality: N/A; . * : FLEXIBLE SIGMOIDOSCOPY * : N/A * : 03/10/2022 * : Procedure: FLEXIBLE SIGMOIDOSCOPY; Surgeon: Vida Rigger, MD; Location: [facility] ENDOSCOPY; Service: Gastroenterology; Laterality: N/A; . * :  lymph node removal left leg * : * : * : cat scratch surgery . * : ORIF ANKLE FRACTURE * : Right * : 09/03/2021 * : Procedure: OPEN REDUCTION INTERNAL FIXATION (ORIF) ANKLE FRACTURE; Surgeon: Joen Laura, MD; Location: [facility] OR; Service: Orthopedics; Laterality: Right; . * : THROAT SURGERY * : * : * : sleep apnea surgery . * : TONSILLECTOMY * : * : . * : VIDEO BRONCHOSCOPY * : * : 08/03/2021 * : Procedure: VIDEO BRONCHOSCOPY WITH FLUORO; Surgeon: Leslye Peer, MD; Location: [facility] ENDOSCOPY; Service: Cardiopulmonary;; Allergies:Latex, Codeine, Atenolol, Other, Hydrocodone, and Oxycodone. Medications: Accu-Chek Guide w/Device KitUse to test blood sugars up to 4 times daily. Accu-Chek Softclix Lancets lancets use to test blood sugars up to 4 times daily as directed. Benadryl Allergy 25 MG tablet generic drug: DiphenhydrAMINE take 25 mg by mouth at bedtime. Cyltezo-CD/UC/HS Starter 40 MG/0.8ML Ajkt generic drug: Adalimumab-adbm inject 160 mLs into the skin every 14 (fourteen) days. Started 09/10/2022. Florastor 250 MG capsule generic drug: saccharomyces boulardii take 250 mg by mouth in the morning. FreeStyle Libre 2 Sensor Misc inject 1 Device into the skin every 14 (fourteen) days. HumaLOG KwikPen 100 UNIT/ML KwikPen generic drug: insulin lispro inject 5 Units into the skin with breakfast, with lunch, and with evening meal. What changed: how much to takewhen to take thisadditional instructions insulin detemir 100 UNIT/ML FlexPen commonly known as: LEVEMIR inject 10 Units into the skin daily. What changed: how much to takewhen to take thisadditional instructions lidocaine 5 % commonly known as: LIDODERM place 1 patch onto the skin daily. Remove & Discard patch within 12 hours or as directed by MD start taking on: October 12, 2022 losartan 50 MG tablet commonly known as: COZAAR take 1 tablet (50 mg total) by mouth daily. Start taking on: October 12, 2022 what changed: how much to takewhen to take this Melatonin 10 MG Tabs take 10 mg by mouth at bedtime. metFORMIN 500 MG tablet commonly known as: GLUCOPHAGE take 500 mg by mouth daily with breakfast. metoprolol succinate 100 MG 24 hr tablet commonly known as: TOPROL-XL take 1 tablet (100 mg total) by mouth daily. Take with or immediately following a meal. Start taking on: October 12, 2022 what changed: medication strengthhow much to takeadditional instructions multivitamin tablet take 1 tablet by mouth daily with breakfast.  ondansetron 4 MG tablet commonly known as: ZOFRAN take 1 tablet (4 mg total) by mouth every 8 (eight) hours as needed for nausea or vomiting. pantoprazole 40 MG tablet commonly known as: PROTONIX take 1 tablet (40 mg total) by mouth 2 (two) times daily. Pentips 32G X 4 MM Misc generic drug: Insulin Pen NeedleUse to inject insulin up to 4 times daily as directed. polyethylene glycol 17 g packet commonly known as: MIRALAX / GLYCOLAX take 17 g by mouth daily. Start taking on: October 12, 2022 rosuvastatin 10 MG tablet commonly known as: CRESTOR take 10 mg by mouth daily. traMADol 50 MG tablet commonly known as: ULTRAM take 2 tablets (100 mg total) by mouth every 8 (eight) hours as needed for moderate pain. TURMERIC PO take 1 capsule by mouth at bedtime. venlafaxine XR 37.5 MG 24 hr capsule commonly known as: EFFEXOR-XR take 37.5 mg by mouth daily with breakfast. REVIEW OF SYSTEMS: Review of systems is negative unless as mentioned above. PHYSICAL EXAMINATION: Patient sitting in a wheelchair. Appears to be in mild distress. Speech and comprehension intact. Alert, awake, oriented to time, place, space. Grossly no abnormal lateralizing  neurological features. Patient is exquisitely tender in the midthoracic T7-T8 region on palpation. ASSESSMENT AND PLAN: Patient's repeat MRI of the thoracic spine of June 16 demonstrates previous postprocedural changes at T8 stable. However, over the interval, there has been decrease of the T7 vertebral body height anteriorly with increased FLAIR and T2 signal at T7 along the inferior endplate indicative of a compression fracture. Given the above neuroimaging findings, and the clinical examination and history, for pain relief and to prevent further collapse at T7, vertebrobasilar augmentation with either a vertebroplasty or kyphoplasty with general anesthesia would be appropriate. The procedure was described in detail. Potential complications of infection, bleeding, were briefly reviewed.  Questions were answered to the patient's satisfaction. The patient expressed her desire to proceed with vertebral body augmentation either with vertebroplasty or kyphoplasty at T7 under anesthesia. This will be scheduled as soon as possible. Patient advised to continue using a walker for ambulation, and to avoid stooping, bending or lifting weights above 10 pounds. Electronically Signed   By: Julieanne Cotton M.D.   On: 11/08/2022 08:39    Labs:  CBC: Recent Labs    10/05/22 0934 10/06/22 0412 10/09/22 0508 11/03/22 1140  WBC 4.2 3.6* 4.7 9.3  HGB 11.1* 9.9* 11.3* 11.9*  HCT 35.3* 32.4* 35.1* 36.7  PLT 231 244 279 214    COAGS: Recent Labs    10/05/22 1224  INR 1.1    BMP: Recent Labs    10/07/22 0503 10/09/22 0508 10/10/22 0448 11/03/22 1140  NA 141 139 137 139  K 3.8 3.3* 3.5 3.9  CL 102 98 97* 98  CO2 28 30 29 30   GLUCOSE 104* 126* 156* 170*  BUN 5* 8 12 17   CALCIUM 8.8* 9.2 9.3 9.6  CREATININE 1.05* 0.94 0.99 1.17*  GFRNONAA 58* >60 >60 51*    LIVER FUNCTION TESTS: Recent Labs    09/26/22 0859 10/03/22 0019 10/04/22 1948 11/03/22 1140  BILITOT 0.4 0.5 0.8 0.3  AST 37 28 28 33  ALT 60* 32 31 24  ALKPHOS 119 80 81 77  PROT 6.5 5.3* 5.8* 6.3*  ALBUMIN 4.3 3.2* 3.4* 3.9    TUMOR MARKERS: No results for input(s): "AFPTM", "CEA", "CA199", "CHROMGRNA" in the last 8760 hours.  Assessment and Plan:  Scheduled for Thoracic 7 Kyphoplasty Risks and benefits of Thoracic 7 Kyphoplasty were discussed with the patient including, but not limited to education regarding the natural healing process of compression fractures without intervention, bleeding, infection, cement migration which may cause spinal cord damage, paralysis, pulmonary embolism or even death.  This interventional procedure involves the use of X-rays and because of the nature of the planned procedure, it is possible that we will have prolonged use of X-ray fluoroscopy.  Potential radiation risks  to you include (but are not limited to) the following: - A slightly elevated risk for cancer  several years later in life. This risk is typically less than 0.5% percent. This risk is low in comparison to the normal incidence of human cancer, which is 33% for women and 50% for men according to the American Cancer Society. - Radiation induced injury can include skin redness, resembling a rash, tissue breakdown / ulcers and hair loss (which can be temporary or permanent).   The likelihood of either of these occurring depends on the difficulty of the procedure and whether you are sensitive to radiation due to previous procedures, disease, or genetic conditions.   IF your procedure requires a prolonged use of radiation, you  will be notified and given written instructions for further action.  It is your responsibility to monitor the irradiated area for the 2 weeks following the procedure and to notify your physician if you are concerned that you have suffered a radiation induced injury.    All of the patient's questions were answered, patient is agreeable to proceed. Consent signed and in chart.   Thank you for this interesting consult.  I greatly enjoyed meeting Meghan Alexander and look forward to participating in their care.  A copy of this report was sent to the requesting provider on this date.  Electronically Signed: Robet Leu, PA-C 11/22/2022, 9:23 AM   I spent a total of    25 Minutes in face to face in clinical consultation, greater than 50% of which was counseling/coordinating care for T7 KP

## 2022-11-22 NOTE — Progress Notes (Signed)
Patient and husband was given discharge instructions. Both verbalized understanding. 

## 2022-11-22 NOTE — Sedation Documentation (Signed)
All meds given PRN verbal order from MD-Deveshwar.

## 2022-11-22 NOTE — Sedation Documentation (Signed)
MD made aware that patient did not take blood pressure medication  this morning.

## 2022-11-23 ENCOUNTER — Emergency Department (HOSPITAL_COMMUNITY): Payer: Medicare Other

## 2022-11-23 ENCOUNTER — Emergency Department (HOSPITAL_COMMUNITY)
Admission: EM | Admit: 2022-11-23 | Discharge: 2022-11-23 | Disposition: A | Discharge status: Home / Self Care | Payer: Medicare Other

## 2022-11-23 DIAGNOSIS — E876 Hypokalemia: Secondary | ICD-10-CM

## 2022-11-23 DIAGNOSIS — R519 Headache, unspecified: Secondary | ICD-10-CM | POA: Insufficient documentation

## 2022-11-23 DIAGNOSIS — F419 Anxiety disorder, unspecified: Secondary | ICD-10-CM | POA: Insufficient documentation

## 2022-11-23 DIAGNOSIS — R0602 Shortness of breath: Secondary | ICD-10-CM | POA: Insufficient documentation

## 2022-11-23 DIAGNOSIS — R110 Nausea: Secondary | ICD-10-CM | POA: Diagnosis not present

## 2022-11-23 DIAGNOSIS — Z91040 Latex allergy status: Secondary | ICD-10-CM | POA: Insufficient documentation

## 2022-11-23 DIAGNOSIS — Z794 Long term (current) use of insulin: Secondary | ICD-10-CM | POA: Insufficient documentation

## 2022-11-23 DIAGNOSIS — I480 Paroxysmal atrial fibrillation: Secondary | ICD-10-CM

## 2022-11-23 DIAGNOSIS — Z79899 Other long term (current) drug therapy: Secondary | ICD-10-CM | POA: Insufficient documentation

## 2022-11-23 LAB — COMPREHENSIVE METABOLIC PANEL
ALT: 20 U/L (ref 0–44)
AST: 28 U/L (ref 15–41)
Albumin: 3.5 g/dL (ref 3.5–5.0)
Alkaline Phosphatase: 71 U/L (ref 38–126)
Anion gap: 15 (ref 5–15)
BUN: 8 mg/dL (ref 8–23)
CO2: 28 mmol/L (ref 22–32)
Calcium: 9.4 mg/dL (ref 8.9–10.3)
Chloride: 97 mmol/L — ABNORMAL LOW (ref 98–111)
Creatinine, Ser: 0.92 mg/dL (ref 0.44–1.00)
GFR, Estimated: >60 mL/min (ref >60)
Glucose, Bld: 150 mg/dL — ABNORMAL HIGH (ref 70–99)
Potassium: 3.1 mmol/L — ABNORMAL LOW (ref 3.5–5.1)
Sodium: 140 mmol/L (ref 135–145)
Total Bilirubin: 0.6 mg/dL (ref 0.3–1.2)
Total Protein: 6.1 g/dL — ABNORMAL LOW (ref 6.5–8.1)

## 2022-11-23 LAB — TROPONIN I (HIGH SENSITIVITY)
Troponin I (High Sensitivity): 6 ng/L (ref <18)
Troponin I (High Sensitivity): 6 ng/L (ref <18)

## 2022-11-23 LAB — CBC
HCT: 34.1 % — ABNORMAL LOW (ref 36.0–46.0)
Hemoglobin: 10.8 g/dL — ABNORMAL LOW (ref 12.0–15.0)
MCH: 26.3 pg (ref 26.0–34.0)
MCHC: 31.7 g/dL (ref 30.0–36.0)
MCV: 83.2 fL (ref 80.0–100.0)
Platelets: 241 10*3/uL (ref 150–400)
RBC: 4.1 MIL/uL (ref 3.87–5.11)
RDW: 14.1 % (ref 11.5–15.5)
WBC: 8.2 10*3/uL (ref 4.0–10.5)
nRBC: 0 % (ref 0.0–0.2)

## 2022-11-23 LAB — LIPASE, BLOOD: Lipase: 36 U/L (ref 11–51)

## 2022-11-23 MED ORDER — METOPROLOL TARTRATE 25 MG PO TABS
50.0000 mg | ORAL_TABLET | Freq: Once | ORAL | Status: AC
  Administered 2022-11-23: 50 mg via ORAL
  Filled 2022-11-23: qty 2

## 2022-11-23 MED ORDER — SODIUM CHLORIDE 0.9 % IV BOLUS
500.0000 mL | Freq: Once | INTRAVENOUS | Status: AC
  Administered 2022-11-23: 500 mL via INTRAVENOUS

## 2022-11-23 MED ORDER — IOHEXOL 350 MG/ML SOLN
75.0000 mL | Freq: Once | INTRAVENOUS | Status: AC | PRN
  Administered 2022-11-23: 75 mL via INTRAVENOUS

## 2022-11-23 MED ORDER — POTASSIUM CHLORIDE CRYS ER 20 MEQ PO TBCR
40.0000 meq | EXTENDED_RELEASE_TABLET | Freq: Once | ORAL | Status: AC
  Administered 2022-11-23: 40 meq via ORAL
  Filled 2022-11-23: qty 2

## 2022-11-23 NOTE — Discharge Instructions (Signed)
Please follow-up with your primary doctor soon as possible.  Return to emergency department medially if develop any fevers, chills, chest pain, worsening shortness of breath, lightheadedness, passout, nausea, vomiting or any new or worsening symptoms that are concerning to you.

## 2022-11-23 NOTE — ED Triage Notes (Signed)
TRIAGE NOTE:   Patient arrives from home by EMS with c/o shortness of breath.  Patient reports has been going on for a few years, but reports has gotten real bad recently.   Patient had procedure done on her back yesterday and 3 weeks ago.   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   Patient is not currently on blood thinner.

## 2022-11-23 NOTE — ED Provider Notes (Signed)
[place] EMERGENCY DEPARTMENT AT [facility] Provider Note   CSN: [id number] Arrival date & time: 11/23/22  4098     History  Chief Complaint  Patient presents with   Shortness of Breath    Meghan Alexander is a 67 y.o. female.  67 year old female presenting emergency department for shortness of breath, palpitations and generalized malaise.  Symptoms have been present for the past several days.  She reports feeling her heart was racing prior to arrival.  She had some nausea, but no vomiting.  She also reported hitting her head 2 days ago after falling off bed.  Started having headache this morning.  Similar nature to prior headaches, but had some nausea and no vomiting.  She reports that her symptoms have largely subsided but every once in a while she will feel some shortness of breath for a few seconds.   Shortness of Breath      Home Medications Prior to Admission medications   Medication Sig Start Date End Date Taking? Authorizing Provider  Accu-Chek Softclix Lancets lancets Use to test blood sugars up to 4 times daily as directed. 08/09/21   Drema Dallas, MD  BENADRYL ALLERGY 25 MG tablet Take 25 mg by mouth at bedtime.    [provider]  Blood Glucose Monitoring Suppl (BLOOD GLUCOSE MONITOR SYSTEM) w/Device KIT Use to test blood sugars up to 4 times daily. 08/09/21   Drema Dallas, MD  Continuous Blood Gluc Sensor (FREESTYLE LIBRE 2 SENSOR) MISC Inject 1 Device into the skin every 14 (fourteen) days.    [provider]  CYLTEZO-CD/UC/HS STARTER 40 MG/0.8ML AJKT Inject 160 mLs into the skin every 14 (fourteen) days. Started 09/10/2022. 09/09/22   [provider]  glucose blood (ACCU-CHEK GUIDE) test strip Use to test blood sugars up to 4 times daily as directed. 08/09/21   Drema Dallas, MD  HUMALOG KWIKPEN 100 UNIT/ML KwikPen Inject 5 Units into the skin with breakfast, with lunch, and with evening meal. Patient taking differently:  Inject 15 Units into the skin See admin instructions. Inject 15 units into the skin before breakfast and an additional 15 units once a day as needed for elevated BGL 10/11/21   Littie Deeds, MD  insulin detemir (LEVEMIR) 100 UNIT/ML FlexPen Inject 10 Units into the skin daily. 10/11/21   Littie Deeds, MD  Insulin Pen Needle 32G X 4 MM MISC Use to inject insulin up to 4 times daily as directed. 08/09/21   Drema Dallas, MD  lidocaine (LIDODERM) 5 % Place 1 patch onto the skin daily. Remove & Discard patch within 12 hours or as directed by MD 10/12/22   Burnadette Pop, MD  losartan (COZAAR) 50 MG tablet Take 1 tablet (50 mg total) by mouth daily. 10/12/22   Burnadette Pop, MD  meclizine (ANTIVERT) 25 MG tablet Take 12.5 mg by mouth 4 (four) times daily. 10/13/22   [provider]  Melatonin 10 MG TABS Take 10 mg by mouth at bedtime.    [provider]  metFORMIN (GLUCOPHAGE) 500 MG tablet Take 500 mg by mouth 2 (two) times daily with a meal. 07/23/19   [provider]  metoprolol succinate (TOPROL-XL) 100 MG 24 hr tablet Take 1 tablet (100 mg total) by mouth daily. Take with or immediately following a meal. 10/12/22   Burnadette Pop, MD  Multiple Vitamin (MULTIVITAMIN) tablet Take 1 tablet by mouth daily with breakfast.    [provider]  ondansetron (ZOFRAN) 4 MG  tablet Take 1 tablet (4 mg total) by mouth every 8 (eight) hours as needed for nausea or vomiting. 10/03/22   Tilden Fossa, MD  pantoprazole (PROTONIX) 40 MG tablet Take 1 tablet (40 mg total) by mouth 2 (two) times daily. 10/11/22   Burnadette Pop, MD  polyethylene glycol powder (GLYCOLAX/MIRALAX) 17 GM/SCOOP powder Take 17 g by mouth daily. Patient not taking: Reported on 11/03/2022 10/12/22   Burnadette Pop, MD  rosuvastatin (CRESTOR) 10 MG tablet Take 10 mg by mouth daily.    [provider]  saccharomyces boulardii (FLORASTOR) 250 MG capsule Take 250 mg by mouth in the morning.    [provider]  traMADol (ULTRAM) 50 MG tablet Take 2 tablets (100 mg total) by mouth every 8 (eight) hours as needed for moderate pain. 10/11/22   Burnadette Pop, MD  TURMERIC PO Take 1 capsule by mouth at bedtime.    [provider]  venlafaxine XR (EFFEXOR-XR) 37.5 MG 24 hr capsule Take 37.5 mg by mouth daily with breakfast.    [provider]      Allergies    Latex, Codeine, Atenolol, Other, Hydrocodone, and Oxycodone    Review of Systems   Review of Systems  Respiratory:  Positive for shortness of breath.     Physical Exam Updated Vital Signs BP (!) 150/92   Pulse 98   Temp 97.9 F (36.6 C) (Oral)   Resp (!) 23   Ht 5\' 2"  (1.575 m)   Wt 74.8 kg   SpO2 93%   BMI 30.16 kg/m  Physical Exam Vitals and nursing note reviewed.  Constitutional:      General: She is not in acute distress. HENT:     Head: Normocephalic and atraumatic.  Cardiovascular:     Rate and Rhythm: Tachycardia present. Rhythm irregular.  Pulmonary:     Effort: Pulmonary effort is normal.     Breath sounds: No decreased breath sounds, wheezing, rhonchi or rales.  Chest:     Chest wall: No tenderness.  Musculoskeletal:     Cervical back: Normal range of motion and neck supple.     Right lower leg: No edema.     Left lower leg: No edema.  Skin:    General: Skin is warm.     Capillary Refill: Capillary refill takes less than 2 seconds.  Neurological:     Mental Status: She is alert and oriented to person, place, and time.  Psychiatric:        Mood and Affect: Mood is anxious.        Behavior: Behavior normal.     ED Results / Procedures / Treatments   Labs (all labs ordered are listed, but only abnormal results are displayed) Labs Reviewed  CBC - Abnormal; Notable for the following components:      Result Value   Hemoglobin 10.8 (*)    HCT 34.1 (*)    All other components within normal limits  COMPREHENSIVE METABOLIC PANEL - Abnormal; Notable for the following  components:   Potassium 3.1 (*)    Chloride 97 (*)    Glucose, Bld 150 (*)    Total Protein 6.1 (*)    All other components within normal limits  LIPASE, BLOOD  TROPONIN I (HIGH SENSITIVITY)  TROPONIN I (HIGH SENSITIVITY)    EKG EKG Interpretation Date/Time:  Wednesday November 23 2022 09:24:16 EDT Ventricular Rate:  116 PR Interval:  159 QRS Duration:  65 QT Interval:  355 QTC Calculation: 494 R  Axis:   45  Text Interpretation: Atrial flutter Nonspecific T abnrm, anterolateral leads Borderline prolonged QT interval Confirmed by Estanislado Pandy [phone]) on 11/23/2022 12:08:59 PM  Radiology CT Angio Chest PE W and/or Wo Contrast  Result Date: 11/23/2022 CLINICAL DATA:  Pulmonary embolism (PE) suspected, high prob EXAM: CT ANGIOGRAPHY CHEST WITH CONTRAST TECHNIQUE: Multidetector CT imaging of the chest was performed using the standard protocol during bolus administration of intravenous contrast. Multiplanar CT image reconstructions and MIPs were obtained to evaluate the vascular anatomy. RADIATION DOSE REDUCTION: This exam was performed according to the departmental dose-optimization program which includes automated exposure control, adjustment of the mA and/or kV according to patient size and/or use of iterative reconstruction technique. CONTRAST:  75mL OMNIPAQUE IOHEXOL 350 MG/ML SOLN COMPARISON:  CT scan angiography chest from 10/04/2022. FINDINGS: Cardiovascular: No evidence of embolism to the proximal subsegmental pulmonary artery level. Normal cardiac size. No pericardial effusion. No aortic aneurysm. There are coronary artery calcifications, in keeping with coronary artery disease. Mediastinum/Nodes: There is a 5 mm calcification in the left thyroid lobe, similar to the prior study. Visualized thyroid gland is otherwise within normal limits. No solid / cystic mediastinal masses. There is dilated and fluid-filled esophagus, which is nonspecific but most likely seen in the settings of chronic  gastroesophageal reflux disease versus esophageal dysmotility. No axillary, mediastinal or hilar lymphadenopathy by size criteria. Lungs/Pleura: The central tracheo-bronchial tree is patent. There are dependent changes throughout bilateral lungs. There are patchy areas of linear, plate-like atelectasis and/or scarring throughout bilateral lungs. No mass or consolidation. No pleural effusion or pneumothorax. There is complete resolution of previously noted 9 mm nodule in the left upper lobe. There is a stable solid noncalcified nodule in the inferior lingula abutting the left major fissure measuring 6.4 x 7 mm, stable since the prior study dating back to 2017. No new or suspicious lung nodule. Upper Abdomen: Multiple calcified granulomas noted in the spleen. Remaining visualized upper abdominal viscera within normal limits. Musculoskeletal: Bilateral breast implants noted. The visualized soft tissues of the chest wall are grossly unremarkable. No suspicious osseous lesions. T7/T8 kyphoplasties noted, new since the prior study. There is superior endplate deformity of T12 vertebral body similar to the prior study. There are mild to moderate multilevel degenerative changes in the visualized spine. Review of the MIP images confirms the above findings. IMPRESSION: 1. No pulmonary embolism to the proximal subsegmental pulmonary artery level. 2. Complete resolution of previously noted 9 mm nodule in the left upper lobe. Stable 7 mm nodule in the inferior lingula, stable since 2017. No new or suspicious lung nodule. 3. Dilated and fluid-filled esophagus, which is nonspecific but most likely seen in the settings of chronic gastroesophageal reflux disease versus esophageal dysmotility. Electronically Signed   By: Jules Schick M.D.   On: 11/23/2022 12:01   CT Head Wo Contrast  Result Date: 11/23/2022 CLINICAL DATA:  Head trauma, minor (Age >= 65y) Headache, increasing frequency or severity EXAM: CT HEAD WITHOUT CONTRAST  TECHNIQUE: Contiguous axial images were obtained from the base of the skull through the vertex without intravenous contrast. RADIATION DOSE REDUCTION: This exam was performed according to the departmental dose-optimization program which includes automated exposure control, adjustment of the mA and/or kV according to patient size and/or use of iterative reconstruction technique. COMPARISON:  None Available. FINDINGS: Brain: No evidence of acute infarction, hemorrhage, hydrocephalus, extra-axial collection or mass lesion/mass effect. There is bilateral periventricular hypodensity, which is non-specific but most likely seen in the settings of  microvascular ischemic changes. Mild in extent. Intracranial arteriosclerosis. Vascular: No hyperdense vessel or unexpected calcification. Skull: Normal. Negative for fracture or focal lesion. Sinuses/Orbits: No acute finding. Other: None. IMPRESSION: No acute intracranial abnormality. Electronically Signed   By: Jules Schick M.D.   On: 11/23/2022 11:49   DG Chest Portable 1 View  Result Date: 11/23/2022 CLINICAL DATA:  SOB EXAM: PORTABLE CHEST 1 VIEW COMPARISON:  10/04/2022. FINDINGS: Linear area of atelectasis/scarring again noted overlying the left lower lung zone. Bilateral lung fields are otherwise clear. Bilateral costophrenic angles are clear. Stable cardio-mediastinal silhouette. Aortic arch calcifications noted. No acute osseous abnormalities. The soft tissues are within normal limits. IMPRESSION: No acute cardiopulmonary process. Aortic Atherosclerosis (ICD10-I70.0). Electronically Signed   By: Jules Schick M.D.   On: 11/23/2022 11:45   IR KYPHO THORACIC WITH BONE BIOPSY  Result Date: 11/23/2022 INDICATION: Severe low thoracic back pain secondary to compression fracture at T7. EXAM: BALLOON KYPHOPLASTY AT T7 COMPARISON:  MRI of the thoracic spine of 10/09/2022. MEDICATIONS: As antibiotic prophylaxis, Ancef 2 g IV was ordered pre-procedure and administered  intravenously within 1 hour of incision. All current medications are in the EMR and have been reviewed as part of this encounter. ANESTHESIA/SEDATION: Moderate (conscious) sedation was employed during this procedure. A total of Versed 2.5 mg and Fentanyl 75 mcg, and Dilaudid 1 mg IV was administered intravenously by the radiology nurse. Total intra-service moderate Sedation Time: 33 minutes. The patient's level of consciousness and vital signs were monitored continuously by radiology nursing throughout the procedure under my direct supervision. FLUOROSCOPY: Radiation Exposure Index (as provided by the fluoroscopic device): 10 minutes, 48 seconds. 338 mGy COMPLICATIONS: None immediate. PROCEDURE: Following a full explanation of the procedure along with the potential associated complications, an informed witnessed consent was obtained. The patient was placed prone on the fluoroscopic table. The skin overlying the thoracic region was then prepped and draped in the usual sterile fashion. The right pedicle at T7 was then infiltrated with 0.25% bupivacaine followed by the advancement of an 11-gauge Jamshidi needle through the right pedicle into the posterior one-third at T7. This was then exchanged for a Kyphon advanced osteo introducer system comprised of a working cannula and a Kyphon osteo drill. This combination was then advanced over a Kyphon osteo bone pin until the tip of the Kyphon osteo drill was in the posterior third at T7. At this time, the bone pin was removed. In a medial trajectory, the combination was advanced until the tip of the working cannula was inside the posterior one-third at T7. The osteo drill was removed. Through the working cannula, a Kyphon inflatable bone tamp 15 x 3 was advanced and positioned with the distal marker 5 mm from the anterior aspect of T7. Crossing of the midline was seen on the AP projection. At this time, the balloon was expanded using contrast via a Kyphon inflation syringe  device via microtubing. Inflations were continued until there was apposition with the superior endplate. At this time, methylmethacrylate mixture was reconstituted with Tobramycin in the Kyphon bone mixing device system. This was then loaded onto the Kyphon bone fillers. The balloon was deflated and removed followed by the instillation of 2 bone filler equivalents of methylmethacrylate mixture at T7 with excellent filling in the AP and lateral projections. No extravasation was noted in the disk spaces or posteriorly into the spinal canal. No epidural venous contamination was seen. The working cannula and the bone filler were then retrieved and removed. Hemostasis was achieved  at the skin entry site. Patient tolerated the procedure well. IMPRESSION: 1. Status post vertebral body augmentation using balloon kyphoplasty at T7 as described without event. If the patient has known osteoporosis, recommend treatment as clinically indicated. If the patient's bone density status is unknown, DEXA scan is recommended. CLINICAL DATA:  Severe lower thoracic pain secondary to compression fracture at T7. Electronically Signed   By: Julieanne Cotton M.D.   On: 11/23/2022 08:00    Procedures Procedures    Medications Ordered in ED Medications  iohexol (OMNIPAQUE) 350 MG/ML injection 75 mL (75 mLs Intravenous Contrast Given 11/23/22 1105)  sodium chloride 0.9 % bolus 500 mL (0 mLs Intravenous Stopped 11/23/22 1346)  metoprolol tartrate (LOPRESSOR) tablet 50 mg (50 mg Oral Given 11/23/22 1252)  potassium chloride SA (KLOR-CON M) CR tablet 40 mEq (40 mEq Oral Given 11/23/22 1346)    ED Course/ Medical Decision Making/ A&P Clinical Course as of 11/23/22 1354  Wed Nov 23, 2022  1207 CT Angio Chest PE W and/or Wo Contrast IMPRESSION: 1. No pulmonary embolism to the proximal subsegmental pulmonary artery level. 2. Complete resolution of previously noted 9 mm nodule in the left upper lobe. Stable 7 mm nodule in the  inferior lingula, stable since 2017. No new or suspicious lung nodule. 3. Dilated and fluid-filled esophagus, which is nonspecific but most likely seen in the settings of chronic gastroesophageal reflux disease versus esophageal dysmotility.   [TY]  1207 CT Head Wo Contrast No acute cardiopulmonary process.  Aortic Atherosclerosis (ICD10-I70.0).   [TY]    Clinical Course User Index [TY] Coral Spikes, DO                                 Medical Decision Making Appearing 67 year old female presenting emergency department for palpitations and shortness of breath.  She is afebrile, appeared to be in A-fib with heart rate in the 117s.  She is maintaining oxygen saturation on room air.  Normotensive.  Physical exam reassuring with clear lungs.  Symptoms of largely subsided.  Her EKG appeared to be atrial fibrillation, no ST segment changes to get ischemia.  Troponin negative x 2.  ACS unlikely.  Given her recent procedure and her elevated heart rate and shortness of breath concern for PE.  However CT angio negative for acute PE.  No pneumonia.  Patient with mild hypokalemia which was repleted.  No other significant metabolic derangements.  Normal kidney function.  She has No fever or leukocytosis to suggest systemic infection.  She has a mild anemia.  She was given metoprolol as she takes at home and has history of A-fib.  She converted to normal sinus rhythm, heart rate currently in the mid 90s normal sinus rhythm on the monitor as interpreted by me.  Given patient's improvement of symptoms with reassuring vital signs and negative workup that she is stable for discharge and further outpatient workup.  Amount and/or Complexity of Data Reviewed External Data Reviewed: notes.    Details: Recent kyphoplasty thoracic vertebrae fracture Labs: ordered. Radiology: ordered. Decision-making details documented in ED Course.  Risk Prescription drug management. Decision regarding  hospitalization.         Final Clinical Impression(s) / ED Diagnoses Final diagnoses:  None    Rx / DC Orders ED Discharge Orders     None         Coral Spikes, DO 11/23/22 1354

## 2022-11-23 NOTE — ED Notes (Signed)
Pt provided discharge instructions and prescription information. Pt was given the opportunity to ask questions and questions were answered.   

## 2022-12-15 ENCOUNTER — Other Ambulatory Visit (HOSPITAL_COMMUNITY): Payer: Self-pay | Admitting: Interventional Radiology

## 2022-12-15 DIAGNOSIS — M545 Low back pain, unspecified: Secondary | ICD-10-CM

## 2022-12-15 DIAGNOSIS — Z9889 Other specified postprocedural states: Secondary | ICD-10-CM

## 2022-12-16 ENCOUNTER — Telehealth (HOSPITAL_COMMUNITY): Payer: Self-pay | Admitting: Radiology

## 2022-12-16 ENCOUNTER — Ambulatory Visit (HOSPITAL_COMMUNITY)
Admission: RE | Admit: 2022-12-16 | Discharge: 2022-12-16 | Disposition: A | Discharge status: Home / Self Care | Payer: Medicare Other | Source: Ambulatory Visit | Attending: Interventional Radiology | Admitting: Interventional Radiology

## 2022-12-16 DIAGNOSIS — Z9889 Other specified postprocedural states: Secondary | ICD-10-CM

## 2022-12-16 DIAGNOSIS — M545 Low back pain, unspecified: Secondary | ICD-10-CM

## 2022-12-16 NOTE — Telephone Encounter (Signed)
Pt called this am and left me a VM asking what time her appt was today. I called her back, left her a VM that her appt with Dr. Corliss Skains is at 1 pm. JM

## 2022-12-17 ENCOUNTER — Emergency Department (HOSPITAL_COMMUNITY): Payer: Medicare Other

## 2022-12-17 ENCOUNTER — Emergency Department (HOSPITAL_COMMUNITY)
Admission: EM | Admit: 2022-12-17 | Discharge: 2022-12-17 | Disposition: A | Discharge status: Home / Self Care | Payer: Medicare Other | Source: Home / Self Care | Attending: Emergency Medicine | Admitting: Emergency Medicine

## 2022-12-17 ENCOUNTER — Encounter (HOSPITAL_COMMUNITY): Payer: Self-pay

## 2022-12-17 ENCOUNTER — Other Ambulatory Visit: Payer: Self-pay

## 2022-12-17 DIAGNOSIS — Z79899 Other long term (current) drug therapy: Secondary | ICD-10-CM | POA: Diagnosis not present

## 2022-12-17 DIAGNOSIS — Z7984 Long term (current) use of oral hypoglycemic drugs: Secondary | ICD-10-CM | POA: Insufficient documentation

## 2022-12-17 DIAGNOSIS — I10 Essential (primary) hypertension: Secondary | ICD-10-CM | POA: Diagnosis not present

## 2022-12-17 DIAGNOSIS — Z794 Long term (current) use of insulin: Secondary | ICD-10-CM | POA: Diagnosis not present

## 2022-12-17 DIAGNOSIS — Z91040 Latex allergy status: Secondary | ICD-10-CM | POA: Insufficient documentation

## 2022-12-17 DIAGNOSIS — R519 Headache, unspecified: Secondary | ICD-10-CM | POA: Insufficient documentation

## 2022-12-17 DIAGNOSIS — E119 Type 2 diabetes mellitus without complications: Secondary | ICD-10-CM | POA: Insufficient documentation

## 2022-12-17 LAB — CBC WITH DIFFERENTIAL/PLATELET
Abs Immature Granulocytes: 0.02 10*3/uL (ref 0.00–0.07)
Basophils Absolute: 0.1 10*3/uL (ref 0.0–0.1)
Basophils Relative: 1 %
Eosinophils Absolute: 0.2 10*3/uL (ref 0.0–0.5)
Eosinophils Relative: 3 %
HCT: 36.3 % (ref 36.0–46.0)
Hemoglobin: 11.9 g/dL — ABNORMAL LOW (ref 12.0–15.0)
Immature Granulocytes: 0 %
Lymphocytes Relative: 16 %
Lymphs Abs: 1.2 10*3/uL (ref 0.7–4.0)
MCH: 25.8 pg — ABNORMAL LOW (ref 26.0–34.0)
MCHC: 32.8 g/dL (ref 30.0–36.0)
MCV: 78.7 fL — ABNORMAL LOW (ref 80.0–100.0)
Monocytes Absolute: 0.7 10*3/uL (ref 0.1–1.0)
Monocytes Relative: 10 %
Neutro Abs: 5.4 10*3/uL (ref 1.7–7.7)
Neutrophils Relative %: 70 %
Platelets: 207 10*3/uL (ref 150–400)
RBC: 4.61 MIL/uL (ref 3.87–5.11)
RDW: 13.3 % (ref 11.5–15.5)
WBC: 7.6 10*3/uL (ref 4.0–10.5)
nRBC: 0 % (ref 0.0–0.2)

## 2022-12-17 LAB — COMPREHENSIVE METABOLIC PANEL
ALT: 24 U/L (ref 0–44)
AST: 29 U/L (ref 15–41)
Albumin: 3.7 g/dL (ref 3.5–5.0)
Alkaline Phosphatase: 74 U/L (ref 38–126)
Anion gap: 15 (ref 5–15)
BUN: 9 mg/dL (ref 8–23)
CO2: 27 mmol/L (ref 22–32)
Calcium: 9.2 mg/dL (ref 8.9–10.3)
Chloride: 96 mmol/L — ABNORMAL LOW (ref 98–111)
Creatinine, Ser: 0.97 mg/dL (ref 0.44–1.00)
GFR, Estimated: >60 mL/min (ref >60)
Glucose, Bld: 169 mg/dL — ABNORMAL HIGH (ref 70–99)
Potassium: 3.7 mmol/L (ref 3.5–5.1)
Sodium: 138 mmol/L (ref 135–145)
Total Bilirubin: 0.5 mg/dL (ref 0.3–1.2)
Total Protein: 5.9 g/dL — ABNORMAL LOW (ref 6.5–8.1)

## 2022-12-17 LAB — TROPONIN I (HIGH SENSITIVITY)
Troponin I (High Sensitivity): 6 ng/L (ref <18)
Troponin I (High Sensitivity): 8 ng/L (ref <18)

## 2022-12-17 LAB — MAGNESIUM: Magnesium: 1.3 mg/dL — ABNORMAL LOW (ref 1.7–2.4)

## 2022-12-17 MED ORDER — GADOBUTROL 1 MMOL/ML IV SOLN
7.0000 mL | Freq: Once | INTRAVENOUS | Status: AC | PRN
  Administered 2022-12-17: 7 mL via INTRAVENOUS

## 2022-12-17 MED ORDER — LORAZEPAM 2 MG/ML IJ SOLN: 1.0000 mg | Freq: Once | INTRAMUSCULAR | Status: DC

## 2022-12-17 MED ORDER — DEXAMETHASONE SODIUM PHOSPHATE 10 MG/ML IJ SOLN
10.0000 mg | Freq: Once | INTRAMUSCULAR | Status: AC
  Administered 2022-12-17: 10 mg via INTRAVENOUS
  Filled 2022-12-17: qty 1

## 2022-12-17 MED ORDER — NAPROXEN 375 MG PO TABS: 375.0000 mg | ORAL_TABLET | Freq: Two times a day (BID) | ORAL | 0 refills | Status: DC

## 2022-12-17 MED ORDER — KETOROLAC TROMETHAMINE 15 MG/ML IJ SOLN
7.5000 mg | Freq: Once | INTRAMUSCULAR | Status: AC
  Administered 2022-12-17: 7.5 mg via INTRAVENOUS
  Filled 2022-12-17: qty 1

## 2022-12-17 MED ORDER — ONDANSETRON HCL 4 MG/2ML IJ SOLN
4.0000 mg | Freq: Once | INTRAMUSCULAR | Status: AC
  Administered 2022-12-17: 4 mg via INTRAVENOUS
  Filled 2022-12-17: qty 2

## 2022-12-17 MED ORDER — METOCLOPRAMIDE HCL 5 MG/ML IJ SOLN
5.0000 mg | Freq: Once | INTRAMUSCULAR | Status: AC
  Administered 2022-12-17: 5 mg via INTRAVENOUS
  Filled 2022-12-17: qty 2

## 2022-12-17 MED ORDER — LACTATED RINGERS IV BOLUS
1000.0000 mL | Freq: Once | INTRAVENOUS | Status: AC
  Administered 2022-12-17: 1000 mL via INTRAVENOUS

## 2022-12-17 MED ORDER — LACTATED RINGERS IV BOLUS
500.0000 mL | Freq: Once | INTRAVENOUS | Status: AC
  Administered 2022-12-17: 500 mL via INTRAVENOUS

## 2022-12-17 MED ORDER — MAGNESIUM SULFATE 2 GM/50ML IV SOLN
2.0000 g | Freq: Once | INTRAVENOUS | Status: AC
  Administered 2022-12-17: 2 g via INTRAVENOUS
  Filled 2022-12-17: qty 50

## 2022-12-17 NOTE — ED Provider Notes (Signed)
[place] EMERGENCY DEPARTMENT AT [facility] Provider Note   CSN: [id number] Arrival date & time: 12/17/22  0744     History  Chief Complaint  Patient presents with   Weakness   Migraine   Nausea    Meghan Alexander is a 67 y.o. female.  HPI Patient with a history of lymphoma presents via EMS.  She was well until she woke last night about 7 hours ago with left arm heaviness, headache.  She was able to sleep a little, but again woke up with similar symptoms.  She now presents with these, though her left arm heaviness has improved.  EMS reports patient was unremarkable en route, though she did have nausea, vomiting, and received Zofran. Patient has been in remission for lymphoma for some time, but did have kyphoplasty in her thoracic spine earlier this year.    Home Medications Prior to Admission medications   Medication Sig Start Date End Date Taking? Authorizing Provider  Accu-Chek Softclix Lancets lancets Use to test blood sugars up to 4 times daily as directed. 08/09/21   Drema Dallas, MD  BENADRYL ALLERGY 25 MG tablet Take 25 mg by mouth at bedtime.    [provider]  Blood Glucose Monitoring Suppl (BLOOD GLUCOSE MONITOR SYSTEM) w/Device KIT Use to test blood sugars up to 4 times daily. 08/09/21   Drema Dallas, MD  Continuous Blood Gluc Sensor (FREESTYLE LIBRE 2 SENSOR) MISC Inject 1 Device into the skin every 14 (fourteen) days.    [provider]  CYLTEZO-CD/UC/HS STARTER 40 MG/0.8ML AJKT Inject 160 mLs into the skin every 14 (fourteen) days. Started 09/10/2022. 09/09/22   [provider]  glucose blood (ACCU-CHEK GUIDE) test strip Use to test blood sugars up to 4 times daily as directed. 08/09/21   Drema Dallas, MD  HUMALOG KWIKPEN 100 UNIT/ML KwikPen Inject 5 Units into the skin with breakfast, with lunch, and with evening meal. Patient taking differently: Inject 15 Units into the skin See admin instructions. Inject 15 units into the  skin before breakfast and an additional 15 units once a day as needed for elevated BGL 10/11/21   Littie Deeds, MD  insulin detemir (LEVEMIR) 100 UNIT/ML FlexPen Inject 10 Units into the skin daily. 10/11/21   Littie Deeds, MD  Insulin Pen Needle 32G X 4 MM MISC Use to inject insulin up to 4 times daily as directed. 08/09/21   Drema Dallas, MD  lidocaine (LIDODERM) 5 % Place 1 patch onto the skin daily. Remove & Discard patch within 12 hours or as directed by MD 10/12/22   Burnadette Pop, MD  losartan (COZAAR) 50 MG tablet Take 1 tablet (50 mg total) by mouth daily. 10/12/22   Burnadette Pop, MD  meclizine (ANTIVERT) 25 MG tablet Take 12.5 mg by mouth 4 (four) times daily. 10/13/22   [provider]  Melatonin 10 MG TABS Take 10 mg by mouth at bedtime.    [provider]  metFORMIN (GLUCOPHAGE) 500 MG tablet Take 500 mg by mouth 2 (two) times daily with a meal. 07/23/19   [provider]  metoprolol succinate (TOPROL-XL) 100 MG 24 hr tablet Take 1 tablet (100 mg total) by mouth daily. Take with or immediately following a meal. 10/12/22   Burnadette Pop, MD  Multiple Vitamin (MULTIVITAMIN) tablet Take 1 tablet by mouth daily with breakfast.    [provider]  ondansetron (ZOFRAN) 4 MG tablet Take 1 tablet (4 mg total) by mouth every  8 (eight) hours as needed for nausea or vomiting. 10/03/22   Tilden Fossa, MD  pantoprazole (PROTONIX) 40 MG tablet Take 1 tablet (40 mg total) by mouth 2 (two) times daily. 10/11/22   Burnadette Pop, MD  polyethylene glycol powder (GLYCOLAX/MIRALAX) 17 GM/SCOOP powder Take 17 g by mouth daily. Patient not taking: Reported on 11/03/2022 10/12/22   Burnadette Pop, MD  rosuvastatin (CRESTOR) 10 MG tablet Take 10 mg by mouth daily.    [provider]  saccharomyces boulardii (FLORASTOR) 250 MG capsule Take 250 mg by mouth in the morning.    [provider]  traMADol (ULTRAM) 50 MG tablet Take 2 tablets (100 mg total) by  mouth every 8 (eight) hours as needed for moderate pain. 10/11/22   Burnadette Pop, MD  TURMERIC PO Take 1 capsule by mouth at bedtime.    [provider]  venlafaxine XR (EFFEXOR-XR) 37.5 MG 24 hr capsule Take 37.5 mg by mouth daily with breakfast.    [provider]      Allergies    Latex, Codeine, Atenolol, Other, Hydrocodone, and Oxycodone    Review of Systems   Review of Systems  All other systems reviewed and are negative.   Physical Exam Updated Vital Signs BP (!) 160/81   Pulse 94   Temp 98.4 F (36.9 C) (Oral)   Resp 18   SpO2 94%  Physical Exam Vitals and nursing note reviewed.  Constitutional:      General: She is not in acute distress.    Appearance: She is well-developed.  HENT:     Head: Normocephalic and atraumatic.  Eyes:     Conjunctiva/sclera: Conjunctivae normal.  Neck:   Cardiovascular:     Rate and Rhythm: Normal rate and regular rhythm.  Pulmonary:     Effort: Pulmonary effort is normal. No respiratory distress.     Breath sounds: Normal breath sounds. No stridor.  Abdominal:     General: There is no distension.  Skin:    General: Skin is warm and dry.  Neurological:     General: No focal deficit present.     Mental Status: She is alert and oriented to person, place, and time.     Cranial Nerves: No cranial nerve deficit.     Motor: Atrophy present. No weakness, tremor or abnormal muscle tone.  Psychiatric:        Mood and Affect: Mood normal.     ED Results / Procedures / Treatments   Labs (all labs ordered are listed, but only abnormal results are displayed) Labs Reviewed  COMPREHENSIVE METABOLIC PANEL - Abnormal; Notable for the following components:      Result Value   Chloride 96 (*)    Glucose, Bld 169 (*)    Total Protein 5.9 (*)    All other components within normal limits  CBC WITH DIFFERENTIAL/PLATELET - Abnormal; Notable for the following components:   Hemoglobin 11.9 (*)    MCV 78.7 (*)    MCH 25.8  (*)    All other components within normal limits  MAGNESIUM - Abnormal; Notable for the following components:   Magnesium 1.3 (*)    All other components within normal limits  TROPONIN I (HIGH SENSITIVITY)  TROPONIN I (HIGH SENSITIVITY)    EKG EKG Interpretation Date/Time:  Saturday December 17 2022 09:23:29 EDT Ventricular Rate:  84 PR Interval:  222 QRS Duration:  84 QT Interval:  404 QTC Calculation: 477 R Axis:   67  Text Interpretation: Sinus  rhythm with 1st degree A-V block Otherwise normal ECG When compared with ECG of 23-Nov-2022 09:24, PREVIOUS ECG IS PRESENT Confirmed by Gerhard Munch [phone]) on 12/17/2022 10:06:32 AM  Radiology CT Head Wo Contrast  Result Date: 12/17/2022 CLINICAL DATA:  Neuro deficit, acute, stroke suspected. Paresthesias of the left upper extremity. EXAM: CT HEAD WITHOUT CONTRAST TECHNIQUE: Contiguous axial images were obtained from the base of the skull through the vertex without intravenous contrast. RADIATION DOSE REDUCTION: This exam was performed according to the departmental dose-optimization program which includes automated exposure control, adjustment of the mA and/or kV according to patient size and/or use of iterative reconstruction technique. COMPARISON:  CT head without contrast 11/23/2022. FINDINGS: Brain: No acute infarct, hemorrhage, or mass lesion is present. Mild periventricular white matter hypoattenuation is stable. Deep brain nuclei are within normal limits. The ventricles are of normal size. No significant extraaxial fluid collection is present. The brainstem and cerebellum are within normal limits. Midline structures are within normal limits. Vascular: No hyperdense vessel or unexpected calcification. Skull: No significant extracranial soft tissue lesion is present. Calvarium is intact. No focal lytic or blastic lesions are present. Sinuses/Orbits: The paranasal sinuses and mastoid air cells are clear. Bilateral lens replacements are noted.  Globes and orbits are otherwise unremarkable. IMPRESSION: 1. No acute intracranial abnormality or significant interval change. 2. Stable mild periventricular white matter disease. This likely reflects the sequela of chronic microvascular ischemia. Electronically Signed   By: Marin Roberts M.D.   On: 12/17/2022 09:02   CT Cervical Spine Wo Contrast  Result Date: 12/17/2022 CLINICAL DATA:  Acute onset of left-sided neck pain extending into left upper extremity. Personal history of lymphoma. EXAM: CT CERVICAL SPINE WITHOUT CONTRAST TECHNIQUE: Multidetector CT imaging of the cervical spine was performed without intravenous contrast. Multiplanar CT image reconstructions were also generated. RADIATION DOSE REDUCTION: This exam was performed according to the departmental dose-optimization program which includes automated exposure control, adjustment of the mA and/or kV according to patient size and/or use of iterative reconstruction technique. COMPARISON:  MRI of the cervical spine without contrast 10/03/2022 FINDINGS: Alignment: Grade 1 degenerative anterolisthesis at C2-3 is stable. Slight anterolisthesis at C4-5 and C6-7 is also stable. Straightening of the normal cervical lordosis is present. Skull base and vertebrae: The craniocervical junction is normal. The vertebral body heights are normal. No acute or healing fracture is present. Soft tissues and spinal canal: No prevertebral fluid or swelling. No visible canal hematoma. No significant cervical adenopathy is present. Disc levels: Multilevel facet degenerative changes are present. Asymmetric left foraminal narrowing is present at C4-5. Bilateral foraminal stenosis is present at C5-6. Upper chest: The visualized lung apices are clear. IMPRESSION: 1. No acute abnormality or significant interval change. 2. 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 3. Asymmetric left foraminal narrowing at C4-5 and bilateral foraminal stenosis at  C5-6. Electronically Signed   By: Marin Roberts M.D.   On: 12/17/2022 08:59    Procedures Procedures    Medications Ordered in ED Medications  lactated ringers bolus 1,000 mL (0 mLs Intravenous Stopped 12/17/22 1114)  ondansetron (ZOFRAN) injection 4 mg (4 mg Intravenous Given 12/17/22 0758)  magnesium sulfate IVPB 2 g 50 mL (0 g Intravenous Stopped 12/17/22 1031)  lactated ringers bolus 500 mL (0 mLs Intravenous Stopped 12/17/22 1240)  dexamethasone (DECADRON) injection 10 mg (10 mg Intravenous Given 12/17/22 1118)  metoCLOPramide (REGLAN) injection 5 mg (5 mg Intravenous Given 12/17/22 1119)  ketorolac (TORADOL) 15 MG/ML injection 7.5 mg (7.5  mg Intravenous Given 12/17/22 1309)    ED Course/ Medical Decision Making/ A&P                                 Medical Decision Making Elderly female with lymphoma, multiple other medical problems including obesity, hypertension, diabetes presents with left arm heaviness, headache.  Broad differential including ACS, return malignancy, lytic lesions, radiculopathy considered.  Amount and/or Complexity of Data Reviewed Independent Historian: EMS External Data Reviewed: notes.    Details: Kyphoplasty notes Labs: ordered. Decision-making details documented in ED Course. Radiology: ordered and independent interpretation performed. Decision-making details documented in ED Course. ECG/medicine tests: ordered and independent interpretation performed. Decision-making details documented in ED Course.  Risk Prescription drug management.   3:28 PM Patient's initial evaluation was notable for generally reassuring head CT sacral evidence for old disease.  Patient also hypomagnesemic, and this was repleted.  With concern of her lymphoma, ongoing headache, patient received additional analgesics, with moderate improvement in symptoms, but with consideration of ongoing pain MRV ordered for consideration of dural vein thrombosis. On signout the patient is  awaiting MRV results.        Final Clinical Impression(s) / ED Diagnoses Final diagnoses:  Bad headache  Hypomagnesemia    Rx / DC Orders ED Discharge Orders     None         Gerhard Munch, MD 12/17/22 1531

## 2022-12-17 NOTE — ED Notes (Signed)
Patient still at imaging.

## 2022-12-17 NOTE — ED Provider Notes (Signed)
Patient was initially seen by Dr. Jeraldine Loots.  Please see his note.  Patient was being evaluated for an acute headache.  MR venogram was pending at the time of shift change.  Patient CT scan earlier was normal.  The MRI of venogram did not show any signs of venous thrombosis.  Evaluation and diagnostic testing in the emergency department does not suggest an emergent condition requiring admission or immediate intervention beyond what has been performed at this time.  The patient is safe for discharge and has been instructed to return immediately for worsening symptoms, change in symptoms or any other concerns.    Linwood Dibbles, MD 12/17/22 [phone]

## 2022-12-17 NOTE — ED Triage Notes (Signed)
Patient BIB EMS today from home with complaints of nausea, headache and weakness. Patient states that she went to bed around 9pm last night and she woke up am 1am with heaviness and numbness noted in the left arm and she went back to bed and when she woke up at 6am she did not have the numbness in the arm anymore but she had a severe headache. She got 4mg  of Zofran in route this morning.

## 2022-12-17 NOTE — Discharge Instructions (Addendum)
The test today in the ED were reassuring.  The CT scan and MR venogram did not show any signs of acute abnormality.  Follow-up with your primary care doctor next week to be rechecked

## 2022-12-17 NOTE — ED Notes (Signed)
Patient has went for imaging

## 2022-12-19 HISTORY — PX: IR RADIOLOGIST EVAL & MGMT: IMG5224

## 2022-12-28 ENCOUNTER — Ambulatory Visit (HOSPITAL_COMMUNITY): Payer: Medicare Other

## 2023-01-02 ENCOUNTER — Telehealth: Payer: Self-pay

## 2023-01-02 NOTE — Telephone Encounter (Signed)
T/C from pt stating she recently saw her PCP, Dr Su Hilt, and he told her her cancer had returned.  He could not give her any explanation as to what or where. She is not aware of this and would like to know if there is any truth to this and if so she needs to be seen ASAP and not wait until her November appt.  Please advise

## 2023-01-03 NOTE — Telephone Encounter (Signed)
Per Dr Candise Che he is not aware of her cancer returning. He reviewed all past scans and did not see what Dr Su Hilt was referring to.  Pt advised to call Dr Su Hilt and have him to elaborate on his findings.  Pt agreed to the plan

## 2023-01-10 ENCOUNTER — Telehealth: Payer: Self-pay

## 2023-01-10 NOTE — Telephone Encounter (Signed)
Pre-operative Risk Assessment    Patient Name: Meghan Alexander  DOB: 10-28-1955 MRN: [id number]      Request for Surgical Clearance    Procedure:   RT TOTAL KNEE ARTHOPLASTY  Date of Surgery:  Clearance TBD                                 Surgeon:  DR. DANIEL MARCHWIANY Surgeon's Group or Practice Name:  [facility] Phone number:  [phone] Fax number:  [phone]   Type of Clearance Requested:   - Medical    Type of Anesthesia:   CHOICE   Additional requests/questions:    Sandre Kitty   01/10/2023, 5:38 PM

## 2023-01-11 NOTE — Telephone Encounter (Signed)
Name: Meghan Alexander  DOB: Nov 30, 1955  MRN: [id number]  Primary Cardiologist: Parke Poisson, MD  Chart reviewed as part of pre-operative protocol coverage. Because of Jaxx Aud past medical history and time since last visit, she will require a follow-up in-office visit in order to better assess preoperative cardiovascular risk.  Pre-op covering staff: - Please schedule appointment and call patient to inform them. If patient already had an upcoming appointment within acceptable timeframe, please add "pre-op clearance" to the appointment notes so provider is aware. - Please contact requesting surgeon's office via preferred method (i.e, phone, fax) to inform them of need for appointment prior to surgery.  No medication indicated as needing held.   Sharlene Dory, PA-C  01/11/2023, 7:54 AM

## 2023-01-11 NOTE — Telephone Encounter (Signed)
S/w pt is aware appt time and date for pre op on Wednesday, Sept 25 with Bernadene Person, NP. Will remove from pre op pool. Pre op added to appt notes.

## 2023-01-11 NOTE — Telephone Encounter (Signed)
Patient is no longer on anticoagulation. I will forward to the pre-op pool for medical clearance.

## 2023-01-18 ENCOUNTER — Ambulatory Visit: Payer: Medicare Other | Attending: Nurse Practitioner | Admitting: Nurse Practitioner

## 2023-01-18 ENCOUNTER — Encounter: Payer: Self-pay | Admitting: Nurse Practitioner

## 2023-01-18 VITALS — BP 110/88 | HR 94 | Ht 62.0 in | Wt 162.6 lb

## 2023-01-18 DIAGNOSIS — Z01810 Encounter for preprocedural cardiovascular examination: Secondary | ICD-10-CM

## 2023-01-18 DIAGNOSIS — E1165 Type 2 diabetes mellitus with hyperglycemia: Secondary | ICD-10-CM | POA: Diagnosis not present

## 2023-01-18 DIAGNOSIS — D6859 Other primary thrombophilia: Secondary | ICD-10-CM | POA: Diagnosis not present

## 2023-01-18 DIAGNOSIS — I10 Essential (primary) hypertension: Secondary | ICD-10-CM

## 2023-01-18 DIAGNOSIS — I480 Paroxysmal atrial fibrillation: Secondary | ICD-10-CM

## 2023-01-18 MED ORDER — APIXABAN 5 MG PO TABS: 5.0000 mg | ORAL_TABLET | Freq: Two times a day (BID) | ORAL | 3 refills | Status: DC

## 2023-01-18 NOTE — Progress Notes (Addendum)
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   GERD (gastroesophageal reflux disease)    hx of reflux-went away with elevating HOB    Hypertension    Pneumonia    hx of    PONV (postoperative nausea and vomiting)    Type 2 diabetes mellitus (HCC) 08/02/2021   Past Surgical History:  Procedure Laterality Date   ABDOMINAL HYSTERECTOMY     ADENOIDECTOMY     BIOPSY  03/10/2022   Procedure: BIOPSY;  Surgeon: Vida Rigger, MD;  Location: [facility] ENDOSCOPY;  Service: Gastroenterology;;   BRONCHIAL BRUSHINGS  08/03/2021   Procedure: BRONCHIAL BRUSHINGS;  Surgeon: Leslye Peer, MD;  Location: [facility] ENDOSCOPY;  Service: Cardiopulmonary;;   BRONCHIAL WASHINGS  08/03/2021   Procedure: BRONCHIAL WASHINGS;  Surgeon: Leslye Peer, MD;  Location: [facility] ENDOSCOPY;  Service: Cardiopulmonary;;   cosmetic surgeries     ESOPHAGEAL MANOMETRY N/A 12/02/2015   Procedure: ESOPHAGEAL MANOMETRY (EM);  Surgeon: Willis Modena, MD;  Location: [facility] ENDOSCOPY;  Service: Endoscopy;  Laterality: N/A;   ESOPHAGOGASTRODUODENOSCOPY N/A 12/02/2015   Procedure: ESOPHAGOGASTRODUODENOSCOPY (EGD);  Surgeon: Willis Modena, MD;  Location: [facility] ENDOSCOPY;  Service: Endoscopy;   Laterality: N/A;   FLEXIBLE SIGMOIDOSCOPY N/A 03/10/2022   Procedure: FLEXIBLE SIGMOIDOSCOPY;  Surgeon: Vida Rigger, MD;  Location: [facility] ENDOSCOPY;  Service: Gastroenterology;  Laterality: N/A;   IR KYPHO THORACIC WITH BONE BIOPSY  11/22/2022   IR RADIOLOGIST EVAL & MGMT  11/08/2022   IR RADIOLOGIST EVAL & MGMT  12/19/2022   IR VERTEBROPLASTY CERV/THOR BX INC UNI/BIL INC/INJECT/IMAGING  10/05/2022   lymph node removal left leg     cat scratch surgery    ORIF ANKLE FRACTURE Right 09/03/2021   Procedure: OPEN REDUCTION INTERNAL FIXATION (ORIF) ANKLE FRACTURE;  Surgeon: Joen Laura, MD;  Location: [facility] OR;  Service: Orthopedics;  Laterality: Right;   THROAT SURGERY     sleep apnea surgery    TONSILLECTOMY     VIDEO BRONCHOSCOPY  08/03/2021   Procedure: VIDEO BRONCHOSCOPY WITH FLUORO;  Surgeon: Leslye Peer, MD;  Location: [facility] ENDOSCOPY;  Service: Cardiopulmonary;;    Allergies  Allergies  Allergen Reactions   Latex Other (See Comments)    Blisters / skin peeling   Codeine Nausea And Vomiting and Other (See Comments)    Hallucinations also   Atenolol Cough   Other Itching, Swelling and Other (See Comments)    DermaPlast; Used after surgery - caused swelling, itching, and wound broke open   Hydrocodone Nausea And Vomiting   Oxycodone Nausea And Vomiting     Labs/Other Studies Reviewed    The following studies were reviewed today:  Cardiac Studies & Procedures       ECHOCARDIOGRAM  ECHOCARDIOGRAM COMPLETE 10/06/2022  Narrative ECHOCARDIOGRAM REPORT  Patient Name:   [name] Date of Exam: 10/06/2022 Medical Rec #:  [id number]      Height:       62.0 in Accession #:    [id number]     Weight:       176.4 lb Date of Birth:  Nov 04, 1955       BSA:          1.812 m Patient Age:    67 years       BP:           162/96 mmHg Patient Gender: F              HR:           101 bpm. Exam Location:  Inpatient  Procedure: 2D Echo, Cardiac Doppler, Color Doppler and  Intracardiac Opacification Agent  Indications:    Edema  History:        Patient has prior history of Echocardiogram examinations, most recent 10/08/2021. Arrythmias:Atrial Fibrillation; Risk Factors:Diabetes and Hypertension. ILD, lymphoma.  Sonographer:    Milda Smart Referring Phys: [id number] ALLISON L ELLIS   Sonographer Comments: Image acquisition challenging due to patient body habitus and Image acquisition challenging due to respiratory motion. IMPRESSIONS   1. Left ventricular ejection fraction, by estimation, is 55 to 60%. The left ventricle has normal function. The left ventricle has no regional wall motion abnormalities. Indeterminate diastolic filling due to E-A fusion. 2. Right ventricular systolic function is normal. The right ventricular size is normal. 3. The mitral valve is normal in structure. Mild mitral valve regurgitation. No evidence of mitral stenosis. 4. The aortic valve is normal in structure. Aortic valve regurgitation is not visualized. No aortic stenosis is present. 5. The inferior vena cava is normal in size with greater than 50% respiratory variability, suggesting right atrial pressure of 3 mmHg.  FINDINGS Left Ventricle: Left ventricular ejection fraction, by estimation, is 55 to 60%. The left ventricle has normal function. The left ventricle has no regional wall motion abnormalities. The left ventricular internal cavity size was normal in size. There is no left ventricular hypertrophy. Indeterminate diastolic filling due to E-A fusion.  Right Ventricle: The right ventricular size is normal. No increase in right ventricular wall thickness. Right ventricular systolic function is normal.  Left Atrium: Left atrial size was normal in size.  Right Atrium: Right atrial size was normal in size.  Pericardium: There is no evidence of pericardial effusion.  Mitral Valve: The mitral valve is normal in structure. There is mild calcification of the mitral valve  leaflet(s). Mild mitral valve regurgitation. No evidence of mitral valve stenosis.  Tricuspid Valve: The tricuspid valve is normal in structure. Tricuspid valve regurgitation is not demonstrated. No evidence of tricuspid stenosis.  Aortic Valve: The aortic valve is normal in structure. Aortic valve regurgitation is not visualized. No aortic stenosis is present.  Pulmonic Valve: The pulmonic valve was not well visualized. Pulmonic valve regurgitation is not visualized. No evidence of pulmonic stenosis.  Aorta: The aortic root is normal in size and structure.  Venous: The inferior vena cava is normal in size with greater than 50% respiratory variability, suggesting right atrial pressure of 3 mmHg.  IAS/Shunts: No atrial level shunt detected by color flow Doppler.   LEFT VENTRICLE PLAX 2D LVIDd:         4.10 cm     Diastology LVIDs:         3.20 cm     LV e' medial:  5.11 cm/s LV PW:         0.90 cm     LV E/e' medial:  22.1 LV IVS:        0.70 cm     LV e' lateral:   7.29 cm/s LVOT diam:     2.30 cm     LV E/e' lateral: 15.5 LV SV:         70 LV SV Index:   39 LVOT Area:     4.15 cm  LV Volumes (MOD) LV vol d, MOD A2C: 86.1 ml LV vol d, MOD A4C: 84.8 ml LV vol s, MOD A2C: 39.0 ml LV vol s, MOD A4C: 38.6 ml LV SV MOD A2C:     47.1 ml LV SV MOD A4C:     84.8 ml LV SV MOD BP:      46.5 ml  RIGHT VENTRICLE RV S prime:     9.25 cm/s TAPSE (M-mode): 1.3 cm  LEFT ATRIUM             Index        RIGHT ATRIUM          Index LA diam:        3.80 cm 2.10 cm/m   RA Area:     7.52 cm LA Vol (A2C):   27.8 ml 15.34 ml/m  RA Volume:   12.90 ml 7.12 ml/m LA Vol (A4C):   29.3 ml 16.17 ml/m LA Biplane Vol: 30.4 ml 16.78 ml/m AORTIC VALVE LVOT Vmax:   89.40 cm/s LVOT Vmean:  71.800 cm/s LVOT VTI:    0.168 m  AORTA Ao Root diam: 3.00 cm Ao Asc diam:  3.70 cm  MITRAL VALVE MV Area (PHT): 4.80 cm     SHUNTS MV Decel Time: 158 msec     Systemic VTI:  0.17 m MR Peak grad: 89.1  mmHg     Systemic Diam: 2.30 cm MR Vmax:      472.00 cm/s MV E velocity: 113.00 cm/s  Arvilla Meres MD Electronically signed by Arvilla Meres MD Signature Date/Time: 10/06/2022/11:19:14 AM    Final            Recent Labs: 03/01/2022: TSH 1.008 10/04/2022: B Natriuretic Peptide 72.9 12/17/2022: ALT 24; BUN 9; Creatinine, Ser 0.97; Hemoglobin 11.9; Magnesium 1.3; Platelets 207; Potassium 3.7; Sodium 138  Recent Lipid Panel    Component Value Date/Time   CHOL 178 08/08/2021 0134   TRIG 126 08/08/2021 0134   HDL 40 (L) 08/08/2021 0134   CHOLHDL 4.5 08/08/2021 0134   VLDL 25 08/08/2021 0134   LDLCALC 113 (H) 08/08/2021 0134    History of Present Illness    67 year old female with the above past medical history including paroxysmal atrial fibrillation, hypertension, type 2 diabetes, 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  She has a history of paroxysmal atrial fibrillation, previously on Eliquis, ever, this was discontinued in the setting of no recurrence of atrial fibrillation.  She was last seen in the office on 01/14/2022 and was doing well from a cardiac standpoint.  She was cleared for any surgery at the time.  She follows with pulmonology for a history of chronic pneumonitis secondary to rituximab use.  She was diagnosed with Crohn's disease earlier this year.  She was hospitalized in June 2024 in the setting of intractable nausea/vomiting, AKI, back pain, bilateral lower extremity edema.  Echocardiogram in 09/2022 showed EF 55 to 60%, normal LV  function, no RWMA, normal RV, mild mitral valve regurgitation. She was evaluated in the ED in 10/2022 for shortness of breath, palpitations.  EKG showed atrial fibrillation with RVR.  CT angio of the chest was negative for PE. She was noted to be mildly hypokalemic.  Ordered to normal sinus rhythm and was discharged home in stable condition.  She  presents today for follow-up and for preoperative cardiac evaluation for right total knee arthroplasty with Dr. Weber Cooks of [facility]. Since her last visit she has well from a cardiac standpoint.  She denies any chest pain, worsening dyspnea, edema, PND, orthopnea, weight gain.  She notes occasional palpitations, denies any associated symptoms.   Home Medications    Current Outpatient Medications  Medication Sig Dispense Refill   apixaban (ELIQUIS) 5 MG TABS tablet Take 1 tablet (5 mg total) by mouth 2 (two) times daily. 180 tablet 3   BENADRYL ALLERGY 25 MG tablet Take 25 mg by mouth at bedtime.     CYLTEZO-CD/UC/HS STARTER 40 MG/0.8ML AJKT Inject 160 mLs into the skin every 14 (fourteen) days. Started 09/10/2022.     HUMALOG KWIKPEN 100 UNIT/ML KwikPen Inject 5 Units into the skin with breakfast, with lunch, and with evening meal. (Patient taking differently: Inject 15 Units into the skin See admin instructions. Inject 15 units into the skin before breakfast and an additional 15 units once a day as needed for elevated BGL) 15 mL 11   insulin detemir (LEVEMIR) 100 UNIT/ML FlexPen Inject 10 Units into the skin daily. 15 mL 0   lidocaine (LIDODERM) 5 % Place 1 patch onto the skin daily. Remove & Discard patch within 12 hours or as directed by MD 30 patch 0   losartan (COZAAR) 50 MG tablet Take 1 tablet (50 mg total) by mouth daily. 30 tablet 0   meclizine (ANTIVERT) 25 MG tablet Take 12.5 mg by mouth 4 (four) times daily.     Melatonin 10 MG TABS Take 10 mg by mouth at bedtime.     metFORMIN (GLUCOPHAGE) 500 MG tablet Take 500 mg by mouth 2 (two) times daily with a meal.     metoprolol succinate (TOPROL-XL) 100 MG 24 hr tablet Take 1 tablet (100 mg total) by mouth daily. Take with or immediately following a meal. 30 tablet 0   Multiple Vitamin (MULTIVITAMIN) tablet Take 1 tablet by mouth daily with breakfast.     naproxen (NAPROSYN) 375 MG tablet Take 1 tablet (375 mg total)  by mouth 2 (two) times daily. 20 tablet 0   omeprazole (PRILOSEC) 20 MG capsule Take 20 mg by mouth daily.     ondansetron (ZOFRAN) 4 MG tablet Take 1 tablet (4 mg total) by mouth every 8 (eight) hours as needed for nausea or vomiting. 20 tablet 0   polyethylene glycol powder (GLYCOLAX/MIRALAX) 17 GM/SCOOP powder Take 17 g by mouth daily. 238 g 0   rosuvastatin (CRESTOR) 10 MG tablet Take 10 mg by mouth daily.     saccharomyces boulardii (FLORASTOR) 250 MG capsule Take 250 mg by mouth in the morning.     traMADol (ULTRAM) 50 MG tablet Take 2 tablets (100 mg total) by mouth every 8 (eight) hours as needed for moderate pain. 20 tablet 0   TURMERIC PO Take 1 capsule by mouth at bedtime.     venlafaxine XR (EFFEXOR-XR) 37.5 MG 24 hr capsule Take 37.5 mg by mouth daily with breakfast.     Accu-Chek Softclix Lancets lancets Use to test blood  sugars up to 4 times daily as directed. 100 each 0   Blood Glucose Monitoring Suppl (BLOOD GLUCOSE MONITOR SYSTEM) w/Device KIT Use to test blood sugars up to 4 times daily. 1 kit 0   Continuous Blood Gluc Sensor (FREESTYLE LIBRE 2 SENSOR) MISC Inject 1 Device into the skin every 14 (fourteen) days.     glucose blood (ACCU-CHEK GUIDE) test strip Use to test blood sugars up to 4 times daily as directed. 100 each 0   Insulin Pen Needle 32G X 4 MM MISC Use to inject insulin up to 4 times daily as directed. 100 each 0   pantoprazole (PROTONIX) 40 MG tablet Take 1 tablet (40 mg total) by mouth 2 (two) times daily. (Patient not taking: Reported on 01/18/2023) 60 tablet 0   No current facility-administered medications for this visit.     Review of Systems    She denies chest pain, palpitations, pnd, orthopnea, n, v, dizziness, syncope, edema, weight gain, or early satiety. All other systems reviewed and are otherwise negative except as noted above.   Physical Exam    VS:  BP 110/88 (BP Location: Right Arm, Patient Position: Sitting, Cuff Size: Normal)   Pulse 94    Ht 5\' 2"  (1.575 m)   Wt 162 lb 9.6 oz (73.8 kg)   SpO2 97%   BMI 29.74 kg/m   GEN: Well nourished, well developed, in no acute distress. HEENT: normal. Neck: Supple, no JVD, carotid bruits, or masses. Cardiac: RRR, no murmurs, rubs, or gallops. No clubbing, cyanosis, edema.  Radials/DP/PT 2+ and equal bilaterally.  Respiratory:  Respirations regular and unlabored, clear to auscultation bilaterally. GI: Soft, nontender, nondistended, BS + x 4. MS: no deformity or atrophy. Skin: warm and dry, no rash. Neuro:  Strength and sensation are intact. Psych: Normal affect.  Accessory Clinical Findings    ECG personally reviewed by me today - EKG Interpretation Date/Time:  Wednesday January 18 2023 10:24:02 EDT Ventricular Rate:  86 PR Interval:  238 QRS Duration:  78 QT Interval:  384 QTC Calculation: 459 R Axis:   44  Text Interpretation: Sinus rhythm with 1st degree A-V block When compared with ECG of 17-Dec-2022 09:23, No significant change was found Confirmed by Bernadene Person ([id number]) on 01/18/2023 10:25:20 AM  - no acute changes.   Lab Results  Component Value Date   WBC 7.6 12/17/2022   HGB 11.9 (L) 12/17/2022   HCT 36.3 12/17/2022   MCV 78.7 (L) 12/17/2022   PLT 207 12/17/2022   Lab Results  Component Value Date   CREATININE 0.97 12/17/2022   BUN 9 12/17/2022   NA 138 12/17/2022   K 3.7 12/17/2022   CL 96 (L) 12/17/2022   CO2 27 12/17/2022   Lab Results  Component Value Date   ALT 24 12/17/2022   AST 29 12/17/2022   ALKPHOS 74 12/17/2022   BILITOT 0.5 12/17/2022   Lab Results  Component Value Date   CHOL 178 08/08/2021   HDL 40 (L) 08/08/2021   LDLCALC 113 (H) 08/08/2021   TRIG 126 08/08/2021   CHOLHDL 4.5 08/08/2021    Lab Results  Component Value Date   HGBA1C 7.9 (H) 07/31/2022    Assessment & Plan    1. Paroxysmal atrial fibrillation: Previously on anticoagulation, however, this was discontinued in the setting of no recurrence of a fib. Recent  episode of breakthrough fibrillation with spontaneous restoration of NSR. CHA2DS2-VASc Score = 4  (hypertension, diabetes).  Reviewed with Dr. Jacques Navy and  with pt, she is agreeable to start Eliquis 5mg  bid.  Continue metoprolol, Eliquis as above.  2. Hypertension: BP well controlled. Continue current antihypertensive regimen.   3. Type 2 diabetes: A1c was 7.9 in 07/2022.  Monitored and managed per PCP.  4. Chronic pneumonitis: Stable intermittent dyspnea. Following with pulmonology.   5. Follicular lymphoma/lung nodule: Follows with hematology/oncology.   6. Preoperative cardiac exam: According to the Revised Cardiac Risk Index (RCRI), her Perioperative Risk of Major Cardiac Event is (%): 0.9. Her Functional Capacity in METs is: 4.4 according to the Duke Activity Status Index (DASI).  Her activity is limited in the setting of orthopedic concerns (patient has broken her back and has had multiple surgeries, she has significant arthritis.  Therefore, based on ACC/AHA guidelines, patient would be at acceptable risk for the planned procedure without further cardiovascular testing.  I will reach out to our pharmacy team for recommendations on holding Eliquis prior to surgery.  Once I receive recommendations, I will finalize surgical clearance and forward these recommendations to the requesting party.  ADDENDUM 03/10/2023: Per office protocol, patient can hold Eliquis for 3 days prior to procedure.  Please resume Eliquis as soon as possible postprocedure, at the discretion of the surgeon.   7. Disposition: Follow-up in 6 months, sooner if needed.       Joylene Grapes, NP 01/18/2023, 10:57 AM

## 2023-01-18 NOTE — Patient Instructions (Addendum)
Medication Instructions:  Start Eliquis 5 mg twice a day  *If you need a refill on your cardiac medications before your next appointment, please call your pharmacy*   Lab Work: No Labs  If you have labs (blood work) drawn today and your tests are completely normal, you will receive your results only by: MyChart Message (if you have MyChart) OR A paper copy in the mail If you have any lab test that is abnormal or we need to change your treatment, we will call you to review the results.   Testing/Procedures: No Testing   Follow-Up: At [facility], you and your health needs are our priority.  As part of our continuing mission to provide you with exceptional heart care, we have created designated Provider Care Teams.  These Care Teams include your primary Cardiologist (physician) and Advanced Practice Providers (APPs -  Physician Assistants and Nurse Practitioners) who all work together to provide you with the care you need, when you need it.  We recommend signing up for the patient portal called "MyChart".  Sign up information is provided on this After Visit Summary.  MyChart is used to connect with patients for Virtual Visits (Telemedicine).  Patients are able to view lab/test results, encounter notes, upcoming appointments, etc.  Non-urgent messages can be sent to your provider as well.   To learn more about what you can do with MyChart, go to [email or web address].    Your next appointment:   6 month(s)  Provider:   Parke Poisson, MD

## 2023-01-19 ENCOUNTER — Other Ambulatory Visit: Payer: Self-pay

## 2023-01-27 ENCOUNTER — Emergency Department (HOSPITAL_COMMUNITY)
Admission: EM | Admit: 2023-01-27 | Discharge: 2023-01-27 | Disposition: A | Discharge status: Home / Self Care | Payer: Medicare Other | Attending: Emergency Medicine | Admitting: Emergency Medicine

## 2023-01-27 ENCOUNTER — Encounter (HOSPITAL_COMMUNITY): Payer: Self-pay | Admitting: Emergency Medicine

## 2023-01-27 ENCOUNTER — Emergency Department (HOSPITAL_COMMUNITY): Payer: Medicare Other

## 2023-01-27 ENCOUNTER — Other Ambulatory Visit: Payer: Self-pay

## 2023-01-27 DIAGNOSIS — Z7901 Long term (current) use of anticoagulants: Secondary | ICD-10-CM | POA: Insufficient documentation

## 2023-01-27 DIAGNOSIS — Z79899 Other long term (current) drug therapy: Secondary | ICD-10-CM | POA: Diagnosis not present

## 2023-01-27 DIAGNOSIS — Z794 Long term (current) use of insulin: Secondary | ICD-10-CM | POA: Diagnosis not present

## 2023-01-27 DIAGNOSIS — E119 Type 2 diabetes mellitus without complications: Secondary | ICD-10-CM | POA: Diagnosis not present

## 2023-01-27 DIAGNOSIS — Z91040 Latex allergy status: Secondary | ICD-10-CM | POA: Diagnosis not present

## 2023-01-27 DIAGNOSIS — Z7984 Long term (current) use of oral hypoglycemic drugs: Secondary | ICD-10-CM | POA: Diagnosis not present

## 2023-01-27 DIAGNOSIS — I10 Essential (primary) hypertension: Secondary | ICD-10-CM | POA: Diagnosis not present

## 2023-01-27 DIAGNOSIS — R42 Dizziness and giddiness: Secondary | ICD-10-CM | POA: Insufficient documentation

## 2023-01-27 HISTORY — DX: Crohn's disease, unspecified, without complications: K50.90

## 2023-01-27 HISTORY — DX: Unspecified atrial fibrillation: I48.91

## 2023-01-27 HISTORY — DX: Achalasia of cardia: K22.0

## 2023-01-27 LAB — COMPREHENSIVE METABOLIC PANEL
ALT: 23 U/L (ref 0–44)
AST: 24 U/L (ref 15–41)
Albumin: 3.7 g/dL (ref 3.5–5.0)
Alkaline Phosphatase: 81 U/L (ref 38–126)
Anion gap: 16 — ABNORMAL HIGH (ref 5–15)
BUN: 10 mg/dL (ref 8–23)
CO2: 24 mmol/L (ref 22–32)
Calcium: 9.2 mg/dL (ref 8.9–10.3)
Chloride: 100 mmol/L (ref 98–111)
Creatinine, Ser: 0.87 mg/dL (ref 0.44–1.00)
GFR, Estimated: >60 mL/min (ref >60)
Glucose, Bld: 132 mg/dL — ABNORMAL HIGH (ref 70–99)
Potassium: 3.7 mmol/L (ref 3.5–5.1)
Sodium: 140 mmol/L (ref 135–145)
Total Bilirubin: 0.8 mg/dL (ref 0.3–1.2)
Total Protein: 6 g/dL — ABNORMAL LOW (ref 6.5–8.1)

## 2023-01-27 LAB — DIFFERENTIAL
Abs Immature Granulocytes: 0.04 10*3/uL (ref 0.00–0.07)
Basophils Absolute: 0 10*3/uL (ref 0.0–0.1)
Basophils Relative: 0 %
Eosinophils Absolute: 0.2 10*3/uL (ref 0.0–0.5)
Eosinophils Relative: 2 %
Immature Granulocytes: 0 %
Lymphocytes Relative: 13 %
Lymphs Abs: 1.4 10*3/uL (ref 0.7–4.0)
Monocytes Absolute: 1 10*3/uL (ref 0.1–1.0)
Monocytes Relative: 10 %
Neutro Abs: 7.5 10*3/uL (ref 1.7–7.7)
Neutrophils Relative %: 75 %

## 2023-01-27 LAB — CBC
HCT: 37.6 % (ref 36.0–46.0)
Hemoglobin: 11.8 g/dL — ABNORMAL LOW (ref 12.0–15.0)
MCH: 25.6 pg — ABNORMAL LOW (ref 26.0–34.0)
MCHC: 31.4 g/dL (ref 30.0–36.0)
MCV: 81.6 fL (ref 80.0–100.0)
Platelets: 248 10*3/uL (ref 150–400)
RBC: 4.61 MIL/uL (ref 3.87–5.11)
RDW: 14.9 % (ref 11.5–15.5)
WBC: 10.1 10*3/uL (ref 4.0–10.5)
nRBC: 0 % (ref 0.0–0.2)

## 2023-01-27 LAB — TROPONIN I (HIGH SENSITIVITY): Troponin I (High Sensitivity): 6 ng/L (ref <18)

## 2023-01-27 MED ORDER — MECLIZINE HCL 25 MG PO TABS: 25.0000 mg | ORAL_TABLET | Freq: Three times a day (TID) | ORAL | 0 refills | Status: DC | PRN

## 2023-01-27 MED ORDER — GADOBUTROL 1 MMOL/ML IV SOLN
8.0000 mL | Freq: Once | INTRAVENOUS | Status: AC | PRN
  Administered 2023-01-27: 8 mL via INTRAVENOUS

## 2023-01-27 MED ORDER — MECLIZINE HCL 25 MG PO TABS
25.0000 mg | ORAL_TABLET | ORAL | Status: AC
  Administered 2023-01-27: 25 mg via ORAL
  Filled 2023-01-27: qty 1

## 2023-01-27 NOTE — ED Notes (Signed)
Pt reports improved dizziness. Pt endorses ambulating while at MRI

## 2023-01-27 NOTE — ED Notes (Signed)
Patient transported to MRI 

## 2023-01-27 NOTE — ED Triage Notes (Signed)
Pt arrives via EMS from private residence. EMS states pt awoke at 0630 with dizziness and nausea. Pt also complaining of L weakness that resolved prior to EMS arrival. Pt reports HTN at home, BG within normal limits.  Pt went to bed at 2000.  Pt with hx chrons' disease Pt reports "I'm spinning to the left." 4mg  zofran.

## 2023-01-27 NOTE — ED Provider Notes (Signed)
[address] EMERGENCY DEPARTMENT AT [facility] Provider Note   CSN: [id number] Arrival date & time: 01/27/23  0846     History  Chief Complaint  Patient presents with   Weakness    Meghan Alexander is a 67 y.o. female.  67 year old female with a history of atrial fibrillation on Eliquis, hypertension, diabetes, follicular lymphoma, and Crohn's disease who presents to the emergency department with dizziness.  Patient reports that last night Meghan Alexander went to bed at 8 PM feeling normal.  This morning at 630 Meghan Alexander awoke and felt like Meghan Alexander was spinning to the left.  Worsened with standing and sitting up.  Has been unable to walk.  Did report a buzzing sensation in her ears.  Initially told EMS that her left side might be weak but Meghan Alexander feels that this was an accurate statement it is more so because Meghan Alexander was spinning to the left.  No history of stroke.  Has had some back surgeries this year as well.  Was given Zofran by EMS for nausea.       Home Medications Prior to Admission medications   Medication Sig Start Date End Date Taking? Authorizing Provider  Accu-Chek Softclix Lancets lancets Use to test blood sugars up to 4 times daily as directed. 08/09/21   Drema Dallas, MD  apixaban (ELIQUIS) 5 MG TABS tablet Take 1 tablet (5 mg total) by mouth 2 (two) times daily. 01/18/23   Joylene Grapes, NP  BENADRYL ALLERGY 25 MG tablet Take 25 mg by mouth at bedtime.    [provider]  Blood Glucose Monitoring Suppl (BLOOD GLUCOSE MONITOR SYSTEM) w/Device KIT Use to test blood sugars up to 4 times daily. 08/09/21   Drema Dallas, MD  Continuous Blood Gluc Sensor (FREESTYLE LIBRE 2 SENSOR) MISC Inject 1 Device into the skin every 14 (fourteen) days.    [provider]  CYLTEZO-CD/UC/HS STARTER 40 MG/0.8ML AJKT Inject 160 mLs into the skin every 14 (fourteen) days. Started 09/10/2022. 09/09/22   [provider]  glucose blood (ACCU-CHEK GUIDE) test strip Use to test blood sugars  up to 4 times daily as directed. 08/09/21   Drema Dallas, MD  HUMALOG KWIKPEN 100 UNIT/ML KwikPen Inject 5 Units into the skin with breakfast, with lunch, and with evening meal. Patient taking differently: Inject 15 Units into the skin See admin instructions. Inject 15 units into the skin before breakfast and an additional 15 units once a day as needed for elevated BGL 10/11/21   Littie Deeds, MD  insulin detemir (LEVEMIR) 100 UNIT/ML FlexPen Inject 10 Units into the skin daily. 10/11/21   Littie Deeds, MD  Insulin Pen Needle 32G X 4 MM MISC Use to inject insulin up to 4 times daily as directed. 08/09/21   Drema Dallas, MD  lidocaine (LIDODERM) 5 % Place 1 patch onto the skin daily. Remove & Discard patch within 12 hours or as directed by MD 10/12/22   Burnadette Pop, MD  losartan (COZAAR) 50 MG tablet Take 1 tablet (50 mg total) by mouth daily. 10/12/22   Burnadette Pop, MD  meclizine (ANTIVERT) 25 MG tablet Take 1 tablet (25 mg total) by mouth 3 (three) times daily as needed for dizziness. 01/27/23   Rondel Baton, MD  Melatonin 10 MG TABS Take 10 mg by mouth at bedtime.    [provider]  metFORMIN (GLUCOPHAGE) 500 MG tablet Take 500 mg by mouth 2 (two) times daily with a meal. 07/23/19  [provider]  metoprolol succinate (TOPROL-XL) 100 MG 24 hr tablet Take 1 tablet (100 mg total) by mouth daily. Take with or immediately following a meal. 10/12/22   Burnadette Pop, MD  Multiple Vitamin (MULTIVITAMIN) tablet Take 1 tablet by mouth daily with breakfast.    [provider]  naproxen (NAPROSYN) 375 MG tablet Take 1 tablet (375 mg total) by mouth 2 (two) times daily. 12/17/22   Linwood Dibbles, MD  omeprazole (PRILOSEC) 20 MG capsule Take 20 mg by mouth daily. 12/13/22   [provider]  ondansetron (ZOFRAN) 4 MG tablet Take 1 tablet (4 mg total) by mouth every 8 (eight) hours as needed for nausea or vomiting. 10/03/22   Tilden Fossa, MD  pantoprazole (PROTONIX)  40 MG tablet Take 1 tablet (40 mg total) by mouth 2 (two) times daily. Patient not taking: Reported on 01/18/2023 10/11/22   Burnadette Pop, MD  polyethylene glycol powder (GLYCOLAX/MIRALAX) 17 GM/SCOOP powder Take 17 g by mouth daily. 10/12/22   Burnadette Pop, MD  rosuvastatin (CRESTOR) 10 MG tablet Take 10 mg by mouth daily.    [provider]  saccharomyces boulardii (FLORASTOR) 250 MG capsule Take 250 mg by mouth in the morning.    [provider]  traMADol (ULTRAM) 50 MG tablet Take 2 tablets (100 mg total) by mouth every 8 (eight) hours as needed for moderate pain. 10/11/22   Burnadette Pop, MD  TURMERIC PO Take 1 capsule by mouth at bedtime.    [provider]  venlafaxine XR (EFFEXOR-XR) 37.5 MG 24 hr capsule Take 37.5 mg by mouth daily with breakfast.    [provider]      Allergies    Latex, Codeine, Atenolol, Other, Hydrocodone, and Oxycodone    Review of Systems   Review of Systems  Physical Exam Updated Vital Signs BP (!) 162/102   Pulse 79   Temp 98 F (36.7 C) (Oral)   Resp 19   Ht 5\' 2"  (1.575 m)   Wt 73.8 kg   SpO2 100%   BMI 29.74 kg/m  Physical Exam Vitals and nursing note reviewed.  Constitutional:      General: Meghan Alexander is not in acute distress.    Appearance: Meghan Alexander is well-developed.  HENT:     Head: Normocephalic and atraumatic.     Right Ear: Tympanic membrane, ear canal and external ear normal.     Left Ear: Tympanic membrane, ear canal and external ear normal.     Nose: Nose normal.  Eyes:     Extraocular Movements: Extraocular movements intact.     Conjunctiva/sclera: Conjunctivae normal.     Pupils: Pupils are equal, round, and reactive to light.  Cardiovascular:     Rate and Rhythm: Normal rate and regular rhythm.     Heart sounds: No murmur heard. Pulmonary:     Effort: Pulmonary effort is normal. No respiratory distress.     Breath sounds: Normal breath sounds.  Musculoskeletal:     Cervical back: Normal  range of motion and neck supple.     Right lower leg: No edema.     Left lower leg: No edema.  Skin:    General: Skin is warm and dry.  Neurological:     Mental Status: Meghan Alexander is alert and oriented to person, place, and time.     Comments: NIHSS Exam  Level of Consciousness: Alert  LOC Questions: Answers Month and Age Correctly  LOC Commands: Opens and Closes Eyes and Hands on command  Best Gaze: Horizontal ocular movements intact  Visual Fields: No visual field loss  Facial Palsy: None  L Upper Extremity Motor: No drift after 10 seconds  R Upper Extremity Motor: No drift after 10 seconds  L Lower extremity Motor: No drift after 5 seconds  R Lower extremity Motor: No drift after 5 seconds  Ataxia: Absent  Sensory: Intact sensation to light touch on face, arms, trunk, and legs bilaterally  Best Language: No aphasia  Dysarthria: No dysarthria  Neglect: No visual or sensory neglect  Truncal ataxia noted when patient is sitting in the stretcher.  Unable to ambulate due to her dizziness.  Psychiatric:        Mood and Affect: Mood normal.     ED Results / Procedures / Treatments   Labs (all labs ordered are listed, but only abnormal results are displayed) Labs Reviewed  CBC - Abnormal; Notable for the following components:      Result Value   Hemoglobin 11.8 (*)    MCH 25.6 (*)    All other components within normal limits  COMPREHENSIVE METABOLIC PANEL - Abnormal; Notable for the following components:   Glucose, Bld 132 (*)    Total Protein 6.0 (*)    Anion gap 16 (*)    All other components within normal limits  DIFFERENTIAL  TROPONIN I (HIGH SENSITIVITY)    EKG EKG Interpretation Date/Time:  Friday January 27 2023 08:52:11 EDT Ventricular Rate:  81 PR Interval:  198 QRS Duration:  86 QT Interval:  404 QTC Calculation: 469 R Axis:   64  Text Interpretation: Sinus rhythm Probable LVH with secondary repol abnrm Confirmed by Vonita Moss [phone]) on 01/27/2023 9:02:26  AM  Radiology MR Brain W and Wo Contrast  Result Date: 01/27/2023 CLINICAL DATA:  Provided history: Vertigo.  History of lymphoma. EXAM: MRI HEAD WITHOUT AND WITH CONTRAST TECHNIQUE: Multiplanar, multiecho pulse sequences of the brain and surrounding structures were obtained without and with intravenous contrast. CONTRAST:  8mL GADAVIST GADOBUTROL 1 MMOL/ML IV SOLN COMPARISON:  MR venogram head 12/17/2022.  Head CT 12/17/2022. FINDINGS: Brain: No age advanced or lobar predominant parenchymal atrophy. Mild multifocal T2 FLAIR hyperintense signal abnormality within the cerebral white matter, nonspecific but most often secondary to chronic small vessel ischemia. No cortical encephalomalacia is identified. There is no acute infarct. No evidence of an intracranial mass. No chronic intracranial blood products. No extra-axial fluid collection. No midline shift. No pathologic intracranial enhancement identified. Vascular: Maintained flow voids within the proximal large arterial vessels. Skull and upper cervical spine: No focal suspicious marrow lesion. Sinuses/Orbits: No mass or acute finding within the imaged orbits. No significant paranasal sinus disease. IMPRESSION: 1. No evidence of an acute intracranial abnormality. 2. Mild multifocal T2 FLAIR hyperintense signal abnormality within the cerebral white matter, nonspecific but most often secondary to chronic small vessel ischemia. Electronically Signed   By: Jackey Loge D.O.   On: 01/27/2023 11:08    Procedures Procedures    Medications Ordered in ED Medications  meclizine (ANTIVERT) tablet 25 mg (25 mg Oral Given 01/27/23 0908)  gadobutrol (GADAVIST) 1 MMOL/ML injection 8 mL (8 mLs Intravenous Contrast Given 01/27/23 1009)    ED Course/ Medical Decision Making/ A&P                                 Medical Decision Making Amount and/or Complexity of Data Reviewed Labs: ordered. Radiology: ordered.  Risk Prescription  drug management.   Herminia Warren is a 66 y.o. female with comorbidities that complicate the patient evaluation including atrial fibrillation on Eliquis, hypertension, diabetes, follicular lymphoma, and Crohn's disease who presents to the emergency department with vertigo.     Initial Ddx:  Central vertigo, peripheral vertigo, BPPV, stroke, ICH, malignancy  MDM/Course:  Patient presents to the emergency department with vertigo.  Symptoms started outside of the window for TNK.  On exam does have some truncal ataxia.  Is unable to walk without any assistance.  Was given meclizine and MRI was ordered due to concerns for possible stroke or central cause of vertigo.  With her history of malignancy did obtain it with and without contrast to rule out any brain metastasis that could be contributing.  MRI returned without any acute findings.  After the meclizine the patient was feeling much better.  Did have an EKG and troponin that did not reveal any acute abnormalities.  Upon re-evaluation Meghan Alexander was able to ambulate in the emergency department without any assistance.  Was discharged home with prescription of meclizine.  Was also informed on how to do the Epley maneuver in case of BPPV.  Will have her follow-up with her primary doctor in several days for continued evaluation.  This patient presents to the ED for concern of complaints listed in HPI, this involves an extensive number of treatment options, and is a complaint that carries with it a high risk of complications and morbidity. Disposition including potential need for admission considered.   Dispo: DC Home. Return precautions discussed including, but not limited to, those listed in the AVS. Allowed pt time to ask questions which were answered fully prior to dc.  Additional history obtained from son Records reviewed Outpatient Clinic Notes The following labs were independently interpreted: Chemistry and show no acute abnormality I personally reviewed and interpreted cardiac  monitoring: normal sinus rhythm  I personally reviewed and interpreted the pt's EKG: see above for interpretation  I have reviewed the patients home medications and made adjustments as needed Social Determinants of health:  Elderly  Portions of this note were generated with Scientist, clinical (histocompatibility and immunogenetics). Dictation errors may occur despite best attempts at proofreading.           Final Clinical Impression(s) / ED Diagnoses Final diagnoses:  Vertigo    Rx / DC Orders ED Discharge Orders          Ordered    meclizine (ANTIVERT) 25 MG tablet  3 times daily PRN        01/27/23 1133              Rondel Baton, MD 01/27/23 2022

## 2023-01-27 NOTE — ED Notes (Signed)
Pt ambulatory with strong and steady gait. PT denies dizziness/lightheadedness. PT reported mild "spinning to left" when turning head abruptly but improved quickly.

## 2023-01-27 NOTE — Discharge Instructions (Addendum)
You were seen for your vertigo in the emergency department.   At home, please take the meclizine we have prescribed you for your dizziness.  You may also try the Epley maneuver to see if it improves your symptoms.  Check your MyChart online for the results of any tests that had not resulted by the time you left the emergency department.   Follow-up with your primary doctor in 2-3 days regarding your visit.    Return immediately to the emergency department if you experience any of the following: Worsening dizziness, double vision, difficulty speaking or swallowing, or any other concerning symptoms.    Thank you for visiting our Emergency Department. It was a pleasure taking care of you today.

## 2023-01-27 NOTE — ED Notes (Signed)
Help get patient into a gown on the monitor did EKG shown to er provider patient is resting with call bell in reach and nurse at bedside

## 2023-02-03 ENCOUNTER — Encounter: Payer: Self-pay | Admitting: Internal Medicine

## 2023-03-09 ENCOUNTER — Telehealth: Payer: Self-pay | Admitting: Pharmacist

## 2023-03-09 NOTE — Telephone Encounter (Signed)
-----   Message from Joylene Grapes sent at 03/09/2023  8:49 AM EST ----- Pharmacy please advise on holding Eliquis prior to TKR scheduled for TBD. Thank you.

## 2023-03-09 NOTE — Telephone Encounter (Signed)
Patient with diagnosis of PAF on Eliquis for anticoagulation.    Procedure: RT TOTAL KNEE ARTHOPLASTY  Date of procedure: TBD   CHA2DS2-VASc Score = 4  This indicates a 4.8% annual risk of stroke. The patient's score is based upon: CHF History: 0 HTN History: 1 Diabetes History: 1 Stroke History: 0 Vascular Disease History: 0 Age Score: 1 Gender Score: 1  CrCl 71 ml/min Platelet count 248K   Per office protocol, patient can hold Eliquis for 3 days prior to procedure.    **This guidance is not considered finalized until pre-operative APP has relayed final recommendations.**

## 2023-03-10 ENCOUNTER — Encounter: Payer: Self-pay | Admitting: Pharmacist

## 2023-03-10 NOTE — Telephone Encounter (Signed)
This encounter was created in error - please disregard.

## 2023-03-10 NOTE — Telephone Encounter (Signed)
-----   Message from Joylene Grapes sent at 03/10/2023  9:35 AM EST ----- She should be. I started her on it last OV. -Em ----- Message ----- From: Cheree Ditto, [facility] Sent: 03/10/2023   8:34 AM EST To: Joylene Grapes, NP  Is patient still on Eliquis?  Your note says no but med list says yes ----- Message ----- From: Joylene Grapes, NP Sent: 03/09/2023   8:49 AM EST To: [facility] Preop Pharm  Pharmacy please advise on holding Eliquis prior to TKR scheduled for TBD. Thank you.

## 2023-03-15 ENCOUNTER — Encounter (HOSPITAL_COMMUNITY): Payer: Self-pay

## 2023-03-15 ENCOUNTER — Ambulatory Visit (HOSPITAL_COMMUNITY)
Admission: RE | Admit: 2023-03-15 | Discharge: 2023-03-15 | Disposition: A | Discharge status: Home / Self Care | Payer: Medicare Other | Source: Ambulatory Visit | Attending: Hematology | Admitting: Hematology

## 2023-03-15 DIAGNOSIS — E042 Nontoxic multinodular goiter: Secondary | ICD-10-CM | POA: Insufficient documentation

## 2023-03-15 DIAGNOSIS — R918 Other nonspecific abnormal finding of lung field: Secondary | ICD-10-CM | POA: Insufficient documentation

## 2023-03-22 ENCOUNTER — Other Ambulatory Visit: Payer: Self-pay

## 2023-03-22 DIAGNOSIS — C8218 Follicular lymphoma grade II, lymph nodes of multiple sites: Secondary | ICD-10-CM

## 2023-03-27 ENCOUNTER — Inpatient Hospital Stay (HOSPITAL_BASED_OUTPATIENT_CLINIC_OR_DEPARTMENT_OTHER): Payer: Medicare Other | Admitting: Hematology

## 2023-03-27 ENCOUNTER — Inpatient Hospital Stay: Payer: Medicare Other | Attending: Hematology

## 2023-03-27 VITALS — BP 143/90 | HR 101 | Temp 97.7°F | Resp 18 | Wt 165.4 lb

## 2023-03-27 DIAGNOSIS — M4726 Other spondylosis with radiculopathy, lumbar region: Secondary | ICD-10-CM | POA: Diagnosis not present

## 2023-03-27 DIAGNOSIS — K760 Fatty (change of) liver, not elsewhere classified: Secondary | ICD-10-CM | POA: Diagnosis not present

## 2023-03-27 DIAGNOSIS — E119 Type 2 diabetes mellitus without complications: Secondary | ICD-10-CM | POA: Insufficient documentation

## 2023-03-27 DIAGNOSIS — K5090 Crohn's disease, unspecified, without complications: Secondary | ICD-10-CM | POA: Diagnosis not present

## 2023-03-27 DIAGNOSIS — Z803 Family history of malignant neoplasm of breast: Secondary | ICD-10-CM | POA: Insufficient documentation

## 2023-03-27 DIAGNOSIS — Z801 Family history of malignant neoplasm of trachea, bronchus and lung: Secondary | ICD-10-CM | POA: Diagnosis not present

## 2023-03-27 DIAGNOSIS — I10 Essential (primary) hypertension: Secondary | ICD-10-CM | POA: Diagnosis not present

## 2023-03-27 DIAGNOSIS — M25569 Pain in unspecified knee: Secondary | ICD-10-CM | POA: Diagnosis not present

## 2023-03-27 DIAGNOSIS — Z885 Allergy status to narcotic agent status: Secondary | ICD-10-CM | POA: Insufficient documentation

## 2023-03-27 DIAGNOSIS — K2289 Other specified disease of esophagus: Secondary | ICD-10-CM | POA: Diagnosis not present

## 2023-03-27 DIAGNOSIS — C8218 Follicular lymphoma grade II, lymph nodes of multiple sites: Secondary | ICD-10-CM

## 2023-03-27 DIAGNOSIS — Z90710 Acquired absence of both cervix and uterus: Secondary | ICD-10-CM | POA: Insufficient documentation

## 2023-03-27 DIAGNOSIS — M48061 Spinal stenosis, lumbar region without neurogenic claudication: Secondary | ICD-10-CM | POA: Insufficient documentation

## 2023-03-27 DIAGNOSIS — Z7901 Long term (current) use of anticoagulants: Secondary | ICD-10-CM | POA: Diagnosis not present

## 2023-03-27 DIAGNOSIS — J849 Interstitial pulmonary disease, unspecified: Secondary | ICD-10-CM | POA: Diagnosis not present

## 2023-03-27 DIAGNOSIS — Z8379 Family history of other diseases of the digestive system: Secondary | ICD-10-CM | POA: Diagnosis not present

## 2023-03-27 DIAGNOSIS — Z79899 Other long term (current) drug therapy: Secondary | ICD-10-CM | POA: Insufficient documentation

## 2023-03-27 LAB — CMP (CANCER CENTER ONLY)
ALT: 17 U/L (ref 0–44)
AST: 16 U/L (ref 15–41)
Albumin: 4.6 g/dL (ref 3.5–5.0)
Alkaline Phosphatase: 83 U/L (ref 38–126)
Anion gap: 9 (ref 5–15)
BUN: 22 mg/dL (ref 8–23)
CO2: 32 mmol/L (ref 22–32)
Calcium: 10.1 mg/dL (ref 8.9–10.3)
Chloride: 101 mmol/L (ref 98–111)
Creatinine: 1.15 mg/dL — ABNORMAL HIGH (ref 0.44–1.00)
GFR, Estimated: 52 mL/min — ABNORMAL LOW (ref >60)
Glucose, Bld: 144 mg/dL — ABNORMAL HIGH (ref 70–99)
Potassium: 4.1 mmol/L (ref 3.5–5.1)
Sodium: 142 mmol/L (ref 135–145)
Total Bilirubin: 0.4 mg/dL (ref <1.2)
Total Protein: 6.8 g/dL (ref 6.5–8.1)

## 2023-03-27 LAB — CBC WITH DIFFERENTIAL (CANCER CENTER ONLY)
Abs Immature Granulocytes: 0.04 10*3/uL (ref 0.00–0.07)
Basophils Absolute: 0.1 10*3/uL (ref 0.0–0.1)
Basophils Relative: 1 %
Eosinophils Absolute: 0.2 10*3/uL (ref 0.0–0.5)
Eosinophils Relative: 1 %
HCT: 41.8 % (ref 36.0–46.0)
Hemoglobin: 13.8 g/dL (ref 12.0–15.0)
Immature Granulocytes: 0 %
Lymphocytes Relative: 12 %
Lymphs Abs: 1.5 10*3/uL (ref 0.7–4.0)
MCH: 28.2 pg (ref 26.0–34.0)
MCHC: 33 g/dL (ref 30.0–36.0)
MCV: 85.5 fL (ref 80.0–100.0)
Monocytes Absolute: 1.1 10*3/uL — ABNORMAL HIGH (ref 0.1–1.0)
Monocytes Relative: 9 %
Neutro Abs: 9.2 10*3/uL — ABNORMAL HIGH (ref 1.7–7.7)
Neutrophils Relative %: 77 %
Platelet Count: 238 10*3/uL (ref 150–400)
RBC: 4.89 MIL/uL (ref 3.87–5.11)
RDW: 14.6 % (ref 11.5–15.5)
WBC Count: 12 10*3/uL — ABNORMAL HIGH (ref 4.0–10.5)
nRBC: 0 % (ref 0.0–0.2)

## 2023-03-27 LAB — SAMPLE TO BLOOD BANK

## 2023-03-27 LAB — LACTATE DEHYDROGENASE: LDH: 204 U/L — ABNORMAL HIGH (ref 98–192)

## 2023-03-27 NOTE — Progress Notes (Signed)
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   INTERVAL HISTORY  [name] is a 67 y.o. 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   Patient was last seen by me on 11/03/2022 and she complained of chronic central back pain.  Patient notes she has been doing well overall since our last visit. She denies any new infection issues, fever, chill, night sweats, unexpected weight loss, back pain, chest pain, abnormal bowel movement, abdominal pain, or leg swelling. She does complain of knee pain. She is planning to have a knee surgery.   She had 2 back surgeries without any complications since our last visit. She notes that her back pain improved since her surgeries.   Patient notes her breathing has improved since our last visit. She continues to follow-up with her Pulmonologist.   She notes that her Crohn's disease as been bothersome for the past 2-3 weeks. She has been prescribed Cyltezo-CD/US/HS starter 40 mg/0.8ML AJKT for her Crohn's disease.   Patient denies past medical history of nodules in her thyroid. She is unsure if she has endocrinologist.   She is complaint with all of her medications. She currently takes Vitamin B-3 and iron supplements.  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   GERD (gastroesophageal reflux disease)    hx of reflux-went away with elevating HOB    Hypertension    Pneumonia    hx of    PONV (postoperative nausea and vomiting)    Type 2 diabetes mellitus (HCC) 08/02/2021    SURGICAL HISTORY: Past Surgical History:  Procedure Laterality Date   ABDOMINAL HYSTERECTOMY     ADENOIDECTOMY     ANKLE SURGERY     BIOPSY  03/10/2022   Procedure: BIOPSY;  Surgeon: Vida Rigger, MD;  Location: [facility] ENDOSCOPY;  Service: Gastroenterology;;   BRONCHIAL BRUSHINGS  08/03/2021   Procedure: BRONCHIAL BRUSHINGS;  Surgeon: Leslye Peer, MD;  Location: [facility] ENDOSCOPY;  Service: Cardiopulmonary;;   BRONCHIAL WASHINGS  08/03/2021   Procedure: BRONCHIAL WASHINGS;  Surgeon: Leslye Peer, MD;  Location: [facility] ENDOSCOPY;  Service: Cardiopulmonary;;   cosmetic surgeries     ESOPHAGEAL MANOMETRY N/A 12/02/2015   Procedure: ESOPHAGEAL MANOMETRY (EM);  Surgeon: Willis Modena, MD;  Location: [facility] ENDOSCOPY;  Service: Endoscopy;  Laterality: N/A;   ESOPHAGOGASTRODUODENOSCOPY N/A 12/02/2015   Procedure: ESOPHAGOGASTRODUODENOSCOPY (EGD);  Surgeon: Willis Modena, MD;  Location: [facility] ENDOSCOPY;  Service: Endoscopy;  Laterality:  N/A;   FLEXIBLE SIGMOIDOSCOPY N/A 03/10/2022   Procedure: FLEXIBLE SIGMOIDOSCOPY;  Surgeon: Vida Rigger, MD;  Location: [facility] ENDOSCOPY;  Service: Gastroenterology;  Laterality: N/A;   IR KYPHO THORACIC WITH BONE BIOPSY  11/22/2022   IR RADIOLOGIST EVAL & MGMT  11/08/2022   IR RADIOLOGIST EVAL & MGMT  12/19/2022   IR VERTEBROPLASTY CERV/THOR BX INC UNI/BIL INC/INJECT/IMAGING  10/05/2022   lymph node removal left leg     cat  scratch surgery    ORIF ANKLE FRACTURE Right 09/03/2021   Procedure: OPEN REDUCTION INTERNAL FIXATION (ORIF) ANKLE FRACTURE;  Surgeon: Joen Laura, MD;  Location: [facility] OR;  Service: Orthopedics;  Laterality: Right;   THROAT SURGERY     sleep apnea surgery    TONSILLECTOMY     VIDEO BRONCHOSCOPY  08/03/2021   Procedure: VIDEO BRONCHOSCOPY WITH FLUORO;  Surgeon: Leslye Peer, MD;  Location: [facility] ENDOSCOPY;  Service: Cardiopulmonary;;    SOCIAL HISTORY: Social History   Socioeconomic History   Marital status: Married    Spouse name: Not on file   Number of children: Not on file   Years of education: Not on file   Highest education level: Not on file  Occupational History   Not on file  Tobacco Use   Smoking status: Never   Smokeless tobacco: Never  Vaping Use   Vaping status: Never Used  Substance and Sexual Activity   Alcohol use: No    Alcohol/week: 0.0 standard drinks of alcohol   Drug use: No   Sexual activity: Not on file  Other Topics Concern   Not on file  Social History Narrative   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, newspaper at [employer]   Never smoker never drinker   No drugs   Multiple allergies   Lives at home with her husband of 8 years since 2008   Social Determinants of Health   Financial Resource Strain: Not on file  Food Insecurity: No Food Insecurity (10/05/2022)   Hunger Vital Sign    Worried About Running Out of Food in the Last Year: Never true    Ran Out of Food in the Last Year: Never true  Transportation Needs: No Transportation Needs (10/05/2022)   PRAPARE - Administrator, Civil Service (Medical): No    Lack of Transportation (Non-Medical): No  Physical Activity: Not on file  Stress: Not on file  Social Connections: Not on file  Intimate Partner Violence: Not At Risk (10/05/2022)   Humiliation, Afraid, Rape, and Kick questionnaire    Fear of Current or Ex-Partner: No    Emotionally Abused: No     Physically Abused: No    Sexually Abused: No    FAMILY HISTORY: Family History  Problem Relation Age of Onset   Lung cancer Father        1982 died from it   Hernia Mother    Breast cancer Maternal Aunt        multiple    Breast cancer Maternal Grandmother    Breast cancer Paternal Grandmother    Breast cancer Cousin        cousins multiple    Colon cancer Neg Hx    Colon polyps Neg Hx    Rectal cancer Neg Hx    Stomach cancer Neg Hx     ALLERGIES:  is allergic to latex, codeine, atenolol, other, hydrocodone, and oxycodone.  MEDICATIONS:  Current Outpatient Medications  Medication Sig Dispense Refill   Accu-Chek Softclix Lancets  lancets Use to test blood sugars up to 4 times daily as directed. 100 each 0   apixaban (ELIQUIS) 5 MG TABS tablet Take 1 tablet (5 mg total) by mouth 2 (two) times daily. 180 tablet 3   BENADRYL ALLERGY 25 MG tablet Take 25 mg by mouth at bedtime.     Blood Glucose Monitoring Suppl (BLOOD GLUCOSE MONITOR SYSTEM) w/Device KIT Use to test blood sugars up to 4 times daily. 1 kit 0   Continuous Blood Gluc Sensor (FREESTYLE LIBRE 2 SENSOR) MISC Inject 1 Device into the skin every 14 (fourteen) days.     CYLTEZO-CD/UC/HS STARTER 40 MG/0.8ML AJKT Inject 160 mLs into the skin every 14 (fourteen) days. Started 09/10/2022.     glucose blood (ACCU-CHEK GUIDE) test strip Use to test blood sugars up to 4 times daily as directed. 100 each 0   HUMALOG KWIKPEN 100 UNIT/ML KwikPen Inject 5 Units into the skin with breakfast, with lunch, and with evening meal. (Patient taking differently: Inject 15 Units into the skin See admin instructions. Inject 15 units into the skin before breakfast and an additional 15 units once a day as needed for elevated BGL) 15 mL 11   insulin detemir (LEVEMIR) 100 UNIT/ML FlexPen Inject 10 Units into the skin daily. 15 mL 0   Insulin Pen Needle 32G X 4 MM MISC Use to inject insulin up to 4 times daily as directed. 100 each 0   lidocaine  (LIDODERM) 5 % Place 1 patch onto the skin daily. Remove & Discard patch within 12 hours or as directed by MD 30 patch 0   losartan (COZAAR) 50 MG tablet Take 1 tablet (50 mg total) by mouth daily. 30 tablet 0   meclizine (ANTIVERT) 25 MG tablet Take 1 tablet (25 mg total) by mouth 3 (three) times daily as needed for dizziness. 30 tablet 0   Melatonin 10 MG TABS Take 10 mg by mouth at bedtime.     metFORMIN (GLUCOPHAGE) 500 MG tablet Take 500 mg by mouth 2 (two) times daily with a meal.     metoprolol succinate (TOPROL-XL) 100 MG 24 hr tablet Take 1 tablet (100 mg total) by mouth daily. Take with or immediately following a meal. 30 tablet 0   Multiple Vitamin (MULTIVITAMIN) tablet Take 1 tablet by mouth daily with breakfast.     naproxen (NAPROSYN) 375 MG tablet Take 1 tablet (375 mg total) by mouth 2 (two) times daily. 20 tablet 0   omeprazole (PRILOSEC) 20 MG capsule Take 20 mg by mouth daily.     ondansetron (ZOFRAN) 4 MG tablet Take 1 tablet (4 mg total) by mouth every 8 (eight) hours as needed for nausea or vomiting. 20 tablet 0   pantoprazole (PROTONIX) 40 MG tablet Take 1 tablet (40 mg total) by mouth 2 (two) times daily. (Patient not taking: Reported on 01/18/2023) 60 tablet 0   polyethylene glycol powder (GLYCOLAX/MIRALAX) 17 GM/SCOOP powder Take 17 g by mouth daily. 238 g 0   rosuvastatin (CRESTOR) 10 MG tablet Take 10 mg by mouth daily.     saccharomyces boulardii (FLORASTOR) 250 MG capsule Take 250 mg by mouth in the morning.     traMADol (ULTRAM) 50 MG tablet Take 2 tablets (100 mg total) by mouth every 8 (eight) hours as needed for moderate pain. 20 tablet 0   TURMERIC PO Take 1 capsule by mouth at bedtime.     venlafaxine XR (EFFEXOR-XR) 37.5 MG 24 hr capsule Take 37.5 mg by mouth  daily with breakfast.     No current facility-administered medications for this visit.    REVIEW OF SYSTEMS: Fevers no chills no night sweats Persistent nonproductive cough. Dyspnea on exertion  present  PHYSICAL EXAMINATION:  Vitals:   03/27/23 1215  BP: (!) 143/90  Pulse: (!) 101  Resp: 18  Temp: 97.7 F (36.5 C)  SpO2: 99%   GENERAL:alert, in no acute distress and comfortable SKIN: no acute rashes, no significant lesions EYES: conjunctiva are pink and non-injected, sclera anicteric OROPHARYNX: MMM, no exudates, no oropharyngeal erythema or ulceration NECK: supple, no JVD LYMPH:  no palpable lymphadenopathy in the cervical, axillary or inguinal regions LUNGS: clear to auscultation b/l with normal respiratory effort HEART: regular rate & rhythm ABDOMEN:  normoactive bowel sounds , non tender, not distended. Extremity: no pedal edema PSYCH: alert & oriented x 3 with fluent speech NEURO: no focal motor/sensory deficits   LABORATORY DATA:  I have reviewed the data as listed     Latest Ref Rng & Units 03/27/2023   11:51 AM 01/27/2023    9:02 AM 12/17/2022    7:53 AM  CBC  WBC 4.0 - 10.5 K/uL 12.0  10.1  7.6   Hemoglobin 12.0 - 15.0 g/dL 16.1  09.6  04.5   Hematocrit 36.0 - 46.0 % 41.8  37.6  36.3   Platelets 150 - 400 K/uL 238  248  207        Latest Ref Rng & Units 03/27/2023   11:51 AM 01/27/2023    9:02 AM 12/17/2022    7:53 AM  CMP  Glucose 70 - 99 mg/dL 409  811  914   BUN 8 - 23 mg/dL 22  10  9    Creatinine 0.44 - 1.00 mg/dL 7.82  9.56  2.13   Sodium 135 - 145 mmol/L 142  140  138   Potassium 3.5 - 5.1 mmol/L 4.1  3.7  3.7   Chloride 98 - 111 mmol/L 101  100  96   CO2 22 - 32 mmol/L 32  24  27   Calcium 8.9 - 10.3 mg/dL 08.6  9.2  9.2   Total Protein 6.5 - 8.1 g/dL 6.8  6.0  5.9   Total Bilirubin <1.2 mg/dL 0.4  0.8  0.5   Alkaline Phos 38 - 126 U/L 83  81  74   AST 15 - 41 U/L 16  24  29    ALT 0 - 44 U/L 17  23  24     . Lab Results  Component Value Date   LDH 190 11/03/2022   05/14/2019 CT ANGIO CHEST PE W OR WO CONTRAST (Accession [id number]):   03/08/2019 Lymph Node Biopsy [phone]):      RADIOGRAPHIC STUDIES: I have personally  reviewed the radiological images as listed and agreed with the findings in the report. US THYROID  Result Date: 03/22/2023 CLINICAL DATA:  Incidental on CT. EXAM: THYROID ULTRASOUND TECHNIQUE: Ultrasound examination of the thyroid gland and adjacent soft tissues was performed. COMPARISON:  None Available. FINDINGS: Parenchymal Echotexture: Mildly heterogenous Isthmus: 0.4 cm Right lobe: 4.9 x 2.1 x 2.5 cm Left lobe: 5.1 x 2.2 x 1.9 cm _________________________________________________________ Estimated total number of nodules >/= 1 cm: 5 Number of spongiform nodules >/=  2 cm not described below (TR1): 0 Number of mixed cystic and solid nodules >/= 1.5 cm not described below (TR2): 0 _________________________________________________________ Multiple small cysts and spongiform nodules are scattered throughout the thyroid gland. The vast majority of these  lesions do not meet criteria to warrant biopsy or dedicated imaging surveillance. Individual nodules that do warrant further comment are listed below. Nodule # 2: Solid hypoechoic nodule in the right mid gland measures 1.0 x 0.8 x 0.9 cm. Margins are well defined. No calcifications or suspicious features. Findings are consistent with TI-RADS category 4. *Given size (>/= 1 - 1.4 cm) and appearance, a follow-up ultrasound in 1 year should be considered based on TI-RADS criteria. Nodule # 7: Ill-defined isoechoic solid nodule versus pseudo nodule in the left lower gland measures approximately 1.9 x 1.3 x 1.2 cm. No calcifications or suspicious features. If in fact a true nodule and not a pseudo nodule, findings would be consistent with TI-RADS category 3. *Given size (>/= 1.5 - 2.4 cm) and appearance, a follow-up ultrasound in 1 year should be considered based on TI-RADS criteria. IMPRESSION: 1. Multiple small cysts and nodules consistent with multinodular goiter. 2. Nodule # 2 is a 1 cm TI-RADS category 4 nodule in the right mid gland and just meets criteria to  recommend surveillance. Follow-up ultrasound in 1 year is recommended. 3. Nodule # 7 is a 1.9 cm TI-RADS category 3 nodule versus pseudo nodule and also meets criteria for imaging surveillance. The above is in keeping with the ACR TI-RADS recommendations - J Am Coll Radiol 2017;14:587-595. Electronically Signed   By: Malachy Moan M.D.   On: 03/22/2023 06:26    ASSESSMENT & PLAN:   #1 Stage III/IV follicular lymphoma - likely low grade but accurate grading difficult  11/02/2018 MRI lumbar spine wo contrast revealed "Extensive retroperitoneal lymphadenopathy highly worrisome for neoplastic process such as lymphoproliferative disease. Spondylosis worst at L4-5 where there is narrowing in the right subarticular recess predominantly due to a synovial cyst off the medial margin of the right facet with encroachment on the descending right L5 root. There is mild central canal narrowing overall at this Level."  12/04/2018 CT CAP revealed "Mild-to-moderate abdominal and pelvic lymphadenopathy and new 9 mm left superior mediastinal lymph node, highly suspicious for lymphoproliferative disorder. Stable 8 mm lingular pulmonary nodule, consistent with benign Etiology. Moderate hepatic steatosis. 1.3 cm indeterminate low-attenuation lesion in the right lobe. Recommend continued attention on follow-up CT."  12/18/2018 lymph node biopsy pathology revealing a follicular lymphoma- likely low grade but accurate grading not possible on limited sampling.  02/06/2019 PET/CT scan ([id number]) revealed "1. Bulky hypermetabolic lymphadenopathy in the abdomen and upper pelvis, as described. This is associated with mild hypermetabolic lymphadenopathy in the upper mediastinum and right axilla. Hypermetabolic lymphadenopathy represents a combination of Deauville 4 and Deauville 5 disease. 2. 8 mm nodule in the lingula without hypermetabolism. Attention on follow-up recommended. 3. Hepatic steatosis. 4.  Aortic Atherosclerois  (ICD10-170.0)."  03/08/2019 Lymph Node Biopsy ([id number]) revealed "LYMPH NODE, NEEDLE CORE BIOPSY: - Follicular lymphoma (grade 1-2 of 3) with elevated proliferation rate."  2. Mild treatment related thrombocytopenia- resolved.   3.  H/o Melena.  2 episodes about a month ago.  Hemoglobin stable today. Previously had upper abdominal discomfort which has resolved. Has some chronic nausea due to achalasia.  4.  Chronic ALT elevation -likely from her fatty liver. Plan -Follow-up with PCP and GI for management of hepatic steatosis/steatohepatitis. -Avoid hepatotoxic medications. -Recommended optimal diet and exercise and to optimize control of diabetes with PCP.  5. DM2 -following with PCP  6.  Bilateral Extensive multifocal groundglass opacities with interstitial thickening./Interstitial lung disease following with Dr. Francine Graven from pulmonary. Currently on steroid taper and is off home  oxygen.  Plan:  -Discussed lab results from today, 03/27/2023, in detail with the patient. CBC shows slightly elevated WBC of 12.0 K. CMP pending. -Elevated WBC is most likely due to inflammation of her Crohn's disease.  -CT Chest results from 03/15/2023 1. Stable 7 by 6 mm lingular nodule, no change from 05/14/2019. 2. Stable 3 mm peribronchovascular or endobronchial nodule in the right lower lobe, stable from 2024 but not well perceived on exams from 2023. 3. Dilated esophagus with gas and fluid internally. 4. Old granulomatous disease involving the liver and spleen. 5. S-shaped thoracic scoliosis. Compression fractures with vertebral augmentations at T7 and T8. Stable superior endplate compression fracture at T12.  -Discussed US Thyroid results from 03/15/2023 with the patient. Showed Multiple small cysts and nodules consistent with multinodular goiter. Nodule # 2 is a 1 cm TI-RADS category 4 nodule in the right mid gland and just meets criteria to recommend surveillance. Follow-up ultrasound in 1  year is recommended. Nodule # 7 is a 1.9 cm TI-RADS category 3 nodule versus pseudo nodule and also meets criteria for imaging surveillance. -Recommend to follow-up with PCP or endocrinologist regarding thyroid nodule.  -Recommend to follow-up with Gastroenterologist regarding crohn's inflammation.  -Answered all of patient's questions.   -Patient has no clinical symptoms suggestive of progression of her follicular lymphoma at this time. -Continue to follow up with gastroenterologist and pulmonologist.  -Patient can proceed with her knee surgery from oncology stand-point.  - no clinical or lab evidence of recurrence/progression of follicular lymphoma at this time. -Recommend Vitamin  B-12 supplement.   FOLLOW-UP: RTC with Dr Candise Che with labs in 6 months  The total time spent in the appointment was 30 minutes* .  All of the patient's questions were answered with apparent satisfaction. The patient knows to call the clinic with any problems, questions or concerns.   Wyvonnia Lora MD MS AAHIVMS [facility] Hematology/Oncology Physician [facility]  .*Total Encounter Time as defined by the Centers for Medicare and Medicaid Services includes, in addition to the face-to-face time of a patient visit (documented in the note above) non-face-to-face time: obtaining and reviewing outside history, ordering and reviewing medications, tests or procedures, care coordination (communications with other health care professionals or caregivers) and documentation in the medical record.   I,Param Shah,acting as a Neurosurgeon for Wyvonnia Lora, MD.,have documented all relevant documentation on the behalf of Wyvonnia Lora, MD,as directed by  Wyvonnia Lora, MD while in the presence of Wyvonnia Lora, MD.    .I have reviewed the above documentation for accuracy and completeness, and I agree with the above. Johney Maine MD

## 2023-03-28 ENCOUNTER — Other Ambulatory Visit: Payer: Self-pay

## 2023-03-29 ENCOUNTER — Other Ambulatory Visit: Payer: Self-pay

## 2023-04-02 ENCOUNTER — Encounter: Payer: Self-pay | Admitting: Hematology

## 2023-04-04 NOTE — Progress Notes (Signed)
Sent message, via epic in basket, requesting orders in epic from surgeon.  

## 2023-04-06 ENCOUNTER — Ambulatory Visit: Payer: Self-pay | Admitting: Emergency Medicine

## 2023-04-06 ENCOUNTER — Other Ambulatory Visit: Payer: Self-pay

## 2023-04-06 DIAGNOSIS — C8218 Follicular lymphoma grade II, lymph nodes of multiple sites: Secondary | ICD-10-CM

## 2023-04-06 DIAGNOSIS — G8929 Other chronic pain: Secondary | ICD-10-CM

## 2023-04-06 DIAGNOSIS — E042 Nontoxic multinodular goiter: Secondary | ICD-10-CM

## 2023-04-06 DIAGNOSIS — R918 Other nonspecific abnormal finding of lung field: Secondary | ICD-10-CM

## 2023-04-06 NOTE — H&P (View-Only) (Signed)
 TOTAL KNEE ADMISSION H&P  Patient is being admitted for right total knee arthroplasty.  Subjective:  Chief Complaint:right knee pain.  HPI: Meghan Alexander, 67 y.o. female, has a history of pain and functional disability in the right knee due to arthritis and has failed non-surgical conservative treatments for greater than 12 weeks to includeNSAID's and/or analgesics, corticosteriod injections, viscosupplementation injections, supervised PT with diminished ADL's post treatment, use of assistive devices, and activity modification.  Onset of symptoms was gradual, starting >10 years ago with gradually worsening course since that time. The patient noted no past surgery on the right knee(s).  Patient currently rates pain in the right knee(s) at 9 out of 10 with activity. Patient has night pain, worsening of pain with activity and weight bearing, pain that interferes with activities of daily living, and pain with passive range of motion.  Patient has evidence of periarticular osteophytes and joint space narrowing by imaging studies.  There is no active infection.  Patient Active Problem List   Diagnosis Date Noted   Intractable nausea and vomiting 10/05/2022   Diarrhea 07/28/2022   Infectious colitis 03/07/2022   Chronic diarrhea 03/02/2022   Chronic diarrhea of unknown origin 03/01/2022   ILD (interstitial lung disease) (HCC) 10/10/2021   Atrial fibrillation with RVR (HCC) 10/08/2021   UTI (urinary tract infection) 09/07/2021   Hypoxia 09/06/2021   Closed right ankle fracture 09/03/2021   Pneumonitis 08/26/2021   Exertional dyspnea 08/07/2021   Sinus tachycardia 08/07/2021   Essential hypertension 08/07/2021   Dyspnea on exertion 08/03/2021   Uncontrolled type 2 diabetes mellitus with hyperglycemia (HCC) 08/03/2021   Dyspnea 08/02/2021   Acute respiratory failure with hypoxia (HCC) 08/02/2021   HTN (hypertension) 08/02/2021   Type 2 diabetes mellitus (HCC) 08/02/2021   Hypokalemia  08/02/2021   Achalasia 08/02/2021   Elevated serum free T4 level 05/21/2019   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   Counseling regarding advanced care planning and goals of care 04/02/2019   Follicular lymphoma (HCC) 02/12/2019   Non Hodgkin's lymphoma (HCC) 11/26/2018   Osteoarthritis 11/26/2018   Sepsis-Biolateral PNa c Mod Risk PE in differential 10/17/2014   CAP (community acquired pneumonia) 10/17/2014   Pain in right foot 06/28/2013   Posterior tibial tendonitis of right leg 06/28/2013   Past Medical History:  Diagnosis Date   A-fib [facility])    Achalasia    Allergy    year around allergies   Arthritis    fingers, knees, neck, back-osteoarthristis   Bronchitis    hx of   Complication of anesthesia    Crohn's disease (HCC)    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   GERD (gastroesophageal reflux disease)    hx of reflux-went away with elevating HOB    Hypertension    Pneumonia    hx of    PONV (postoperative nausea and vomiting)    Type 2 diabetes mellitus (HCC) 08/02/2021    Past Surgical History:  Procedure Laterality Date   ABDOMINAL HYSTERECTOMY     ADENOIDECTOMY     ANKLE SURGERY     BIOPSY  03/10/2022   Procedure: BIOPSY;  Surgeon: Vida Rigger, MD;  Location: [facility] ENDOSCOPY;  Service: Gastroenterology;;   BRONCHIAL BRUSHINGS  08/03/2021   Procedure: BRONCHIAL BRUSHINGS;  Surgeon: Leslye Peer, MD;  Location: [facility] ENDOSCOPY;  Service: Cardiopulmonary;;   BRONCHIAL WASHINGS  08/03/2021   Procedure: BRONCHIAL WASHINGS;  Surgeon: Leslye Peer, MD;  Location: [facility] ENDOSCOPY;  Service: Cardiopulmonary;;   cosmetic surgeries     ESOPHAGEAL MANOMETRY N/A 12/02/2015   Procedure: ESOPHAGEAL MANOMETRY (EM);  Surgeon: Willis Modena, MD;  Location: [facility] ENDOSCOPY;  Service: Endoscopy;  Laterality: N/A;   ESOPHAGOGASTRODUODENOSCOPY N/A 12/02/2015   Procedure: ESOPHAGOGASTRODUODENOSCOPY (EGD);   Surgeon: Willis Modena, MD;  Location: [facility] ENDOSCOPY;  Service: Endoscopy;  Laterality: N/A;   FLEXIBLE SIGMOIDOSCOPY N/A 03/10/2022   Procedure: FLEXIBLE SIGMOIDOSCOPY;  Surgeon: Vida Rigger, MD;  Location[facility] ENDOSCOPY;  Service: Gastroenterology;  Laterality: N/A;   IR KYPHO THORACIC WITH BONE BIOPSY  11/22/2022   IR RADIOLOGIST EVAL & MGMT  11/08/2022   IR RADIOLOGIST EVAL & MGMT  12/19/2022   IR VERTEBROPLASTY CERV/THOR BX INC UNI/BIL INC/INJECT/IMAGING  10/05/2022   lymph node removal left leg     cat scratch surgery    ORIF ANKLE FRACTURE Right 09/03/2021   Procedure: OPEN REDUCTION INTERNAL FIXATION (ORIF) ANKLE FRACTURE;  Surgeon: Joen Laura, MD;  Location: [facility] OR;  Service: Orthopedics;  Laterality: Right;   THROAT SURGERY     sleep apnea surgery    TONSILLECTOMY     VIDEO BRONCHOSCOPY  08/03/2021   Procedure: VIDEO BRONCHOSCOPY WITH FLUORO;  Surgeon: Leslye Peer, MD;  Location: [facility] ENDOSCOPY;  Service: Cardiopulmonary;;    Current Outpatient Medications  Medication Sig Dispense Refill Last Dose/Taking   Accu-Chek Softclix Lancets lancets Use to test blood sugars up to 4 times daily as directed. 100 each 0    apixaban (ELIQUIS) 5 MG TABS tablet Take 1 tablet (5 mg total) by mouth 2 (two) times daily. 180 tablet 3    BENADRYL ALLERGY 25 MG tablet Take 25 mg by mouth at bedtime.      Blood Glucose Monitoring Suppl (BLOOD GLUCOSE MONITOR SYSTEM) w/Device KIT Use to test blood sugars up to 4 times daily. 1 kit 0    Continuous Blood Gluc Sensor (FREESTYLE LIBRE 2 SENSOR) MISC Inject 1 Device into the skin every 14 (fourteen) days.      CYLTEZO-CD/UC/HS STARTER 40 MG/0.8ML AJKT Inject 160 mLs into the skin every 14 (fourteen) days. Started 09/10/2022.      glucose blood (ACCU-CHEK GUIDE) test strip Use to test blood sugars up to 4 times daily as directed. 100 each 0    HUMALOG KWIKPEN 100 UNIT/ML KwikPen Inject 5 Units into the skin with breakfast, with lunch, and with  evening meal. (Patient taking differently: Inject 15 Units into the skin See admin instructions. Inject 15 units into the skin before breakfast and an additional 15 units once a day as needed for elevated BGL) 15 mL 11    insulin detemir (LEVEMIR) 100 UNIT/ML FlexPen Inject 10 Units into the skin daily. 15 mL 0    Insulin Pen Needle 32G X 4 MM MISC Use to inject insulin up to 4 times daily as directed. 100 each 0    lidocaine (LIDODERM) 5 % Place 1 patch onto the skin daily. Remove & Discard patch within 12 hours or as directed by MD 30 patch 0    losartan (COZAAR) 50 MG tablet Take 1 tablet (50 mg total) by mouth daily. 30 tablet 0    meclizine (ANTIVERT) 25 MG tablet Take 1 tablet (25 mg total) by mouth 3 (three) times daily as needed for dizziness. 30 tablet 0    Melatonin 10 MG TABS Take 10 mg by mouth at bedtime.      metFORMIN (GLUCOPHAGE) 500 MG tablet Take 500 mg by mouth  2 (two) times daily with a meal.      metoprolol succinate (TOPROL-XL) 100 MG 24 hr tablet Take 1 tablet (100 mg total) by mouth daily. Take with or immediately following a meal. 30 tablet 0    Multiple Vitamin (MULTIVITAMIN) tablet Take 1 tablet by mouth daily with breakfast.      naproxen (NAPROSYN) 375 MG tablet Take 1 tablet (375 mg total) by mouth 2 (two) times daily. 20 tablet 0    omeprazole (PRILOSEC) 20 MG capsule Take 20 mg by mouth daily.      ondansetron (ZOFRAN) 4 MG tablet Take 1 tablet (4 mg total) by mouth every 8 (eight) hours as needed for nausea or vomiting. 20 tablet 0    pantoprazole (PROTONIX) 40 MG tablet Take 1 tablet (40 mg total) by mouth 2 (two) times daily. (Patient not taking: Reported on 01/18/2023) 60 tablet 0    polyethylene glycol powder (GLYCOLAX/MIRALAX) 17 GM/SCOOP powder Take 17 g by mouth daily. 238 g 0    rosuvastatin (CRESTOR) 10 MG tablet Take 10 mg by mouth daily.      saccharomyces boulardii (FLORASTOR) 250 MG capsule Take 250 mg by mouth in the morning.      traMADol (ULTRAM) 50 MG  tablet Take 2 tablets (100 mg total) by mouth every 8 (eight) hours as needed for moderate pain. 20 tablet 0    TURMERIC PO Take 1 capsule by mouth at bedtime.      venlafaxine XR (EFFEXOR-XR) 37.5 MG 24 hr capsule Take 37.5 mg by mouth daily with breakfast.      No current facility-administered medications for this visit.   Allergies  Allergen Reactions   Latex Other (See Comments)    Blisters / skin peeling   Codeine Nausea And Vomiting and Other (See Comments)    Hallucinations also   Atenolol Cough   Other Itching, Swelling and Other (See Comments)    DermaPlast; Used after surgery - caused swelling, itching, and wound broke open   Hydrocodone Nausea And Vomiting   Oxycodone Nausea And Vomiting    Social History   Tobacco Use   Smoking status: Never   Smokeless tobacco: Never  Substance Use Topics   Alcohol use: No    Alcohol/week: 0.0 standard drinks of alcohol    Family History  Problem Relation Age of Onset   Lung cancer Father        1982 died from it   Hernia Mother    Breast cancer Maternal Aunt        multiple    Breast cancer Maternal Grandmother    Breast cancer Paternal Grandmother    Breast cancer Cousin        cousins multiple    Colon cancer Neg Hx    Colon polyps Neg Hx    Rectal cancer Neg Hx    Stomach cancer Neg Hx      Review of Systems  Musculoskeletal:  Positive for arthralgias.  All other systems reviewed and are negative.   Objective:  Physical Exam Constitutional:      General: She is not in acute distress.    Appearance: Normal appearance. She is not ill-appearing.  HENT:     Head: Normocephalic and atraumatic.     Right Ear: External ear normal.     Left Ear: External ear normal.     Nose: Nose normal.     Mouth/Throat:     Mouth: Mucous membranes are moist.     Pharynx:  Oropharynx is clear.  Eyes:     Extraocular Movements: Extraocular movements intact.     Conjunctiva/sclera: Conjunctivae normal.  Cardiovascular:      Rate and Rhythm: Normal rate and regular rhythm.     Pulses: Normal pulses.     Heart sounds: Normal heart sounds.  Pulmonary:     Effort: Pulmonary effort is normal.     Breath sounds: Normal breath sounds.  Abdominal:     General: Bowel sounds are normal.     Palpations: Abdomen is soft.     Tenderness: There is no abdominal tenderness.  Musculoskeletal:        General: Tenderness present.     Cervical back: Normal range of motion and neck supple.     Comments: TTP over medial and lateral joint line, medial worse than lateral.  No calf tenderness, swelling, or erythema.  No overlying lesions of area of chief complaint.  Decreased strength and ROM due to elicited pain.  Pre-operative ROM 0-120.  Dorsiflexion and plantarflexion intact.  Stable to varus and valgus stress.  BLE appear grossly neurovascularly intact.  Gait mildly antalgic.   Skin:    General: Skin is warm and dry.  Neurological:     Mental Status: She is alert and oriented to person, place, and time. Mental status is at baseline.  Psychiatric:        Mood and Affect: Mood normal.        Behavior: Behavior normal.     Vital signs in last 24 hours: @VSRANGES @  Labs:   Estimated body mass index is 30.25 kg/m as calculated from the following:   Height as of 01/27/23: 5\' 2"  (1.575 m).   Weight as of 03/27/23: 75 kg.   Imaging Review Plain radiographs demonstrate severe degenerative joint disease of the right knee(s). The overall alignment ismild varus. The bone quality appears to be fair for age and reported activity level.      Assessment/Plan:  End stage arthritis, right knee   The patient history, physical examination, clinical judgment of the provider and imaging studies are consistent with end stage degenerative joint disease of the right knee(s) and total knee arthroplasty is deemed medically necessary. The treatment options including medical management, injection therapy arthroscopy and arthroplasty were  discussed at length. The risks and benefits of total knee arthroplasty were presented and reviewed. The risks due to aseptic loosening, infection, stiffness, patella tracking problems, thromboembolic complications and other imponderables were discussed. The patient acknowledged the explanation, agreed to proceed with the plan and consent was signed. Patient is being admitted for inpatient treatment for surgery, pain control, PT, OT, prophylactic antibiotics, VTE prophylaxis, progressive ambulation and ADL's and discharge planning. The patient is planning to be discharged  with outpatient PT.    Anticipated LOS equal to or greater than 2 midnights due to - Age 61 and older with one or more of the following:  - Obesity  - Expected need for hospital services (PT, OT, Nursing) required for safe  discharge  - Anticipated need for postoperative skilled nursing care or inpatient rehab  - Active co-morbidities: Nonhodgkins (hx), Diabetes, HTN, GERD, paroxysmal Afib, severe PONV, Crohn's, interstitial lung disease  OR   - Unanticipated findings during/Post Surgery: None  - Patient is a high risk of re-admission due to: None

## 2023-04-06 NOTE — H&P (Signed)
TOTAL KNEE ADMISSION H&P  Patient is being admitted for right total knee arthroplasty.  Subjective:  Chief Complaint:right knee pain.  HPI: Meghan Alexander, 67 y.o. female, has a history of pain and functional disability in the right knee due to arthritis and has failed non-surgical conservative treatments for greater than 12 weeks to includeNSAID's and/or analgesics, corticosteriod injections, viscosupplementation injections, supervised PT with diminished ADL's post treatment, use of assistive devices, and activity modification.  Onset of symptoms was gradual, starting >10 years ago with gradually worsening course since that time. The patient noted no past surgery on the right knee(s).  Patient currently rates pain in the right knee(s) at 9 out of 10 with activity. Patient has night pain, worsening of pain with activity and weight bearing, pain that interferes with activities of daily living, and pain with passive range of motion.  Patient has evidence of periarticular osteophytes and joint space narrowing by imaging studies.  There is no active infection.  Patient Active Problem List   Diagnosis Date Noted   Intractable nausea and vomiting 10/05/2022   Diarrhea 07/28/2022   Infectious colitis 03/07/2022   Chronic diarrhea 03/02/2022   Chronic diarrhea of unknown origin 03/01/2022   ILD (interstitial lung disease) (HCC) 10/10/2021   Atrial fibrillation with RVR (HCC) 10/08/2021   UTI (urinary tract infection) 09/07/2021   Hypoxia 09/06/2021   Closed right ankle fracture 09/03/2021   Pneumonitis 08/26/2021   Exertional dyspnea 08/07/2021   Sinus tachycardia 08/07/2021   Essential hypertension 08/07/2021   Dyspnea on exertion 08/03/2021   Uncontrolled type 2 diabetes mellitus with hyperglycemia (HCC) 08/03/2021   Dyspnea 08/02/2021   Acute respiratory failure with hypoxia (HCC) 08/02/2021   HTN (hypertension) 08/02/2021   Type 2 diabetes mellitus (HCC) 08/02/2021   Hypokalemia  08/02/2021   Achalasia 08/02/2021   Elevated serum free T4 level 05/21/2019   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   Counseling regarding advanced care planning and goals of care 04/02/2019   Follicular lymphoma (HCC) 02/12/2019   Non Hodgkin's lymphoma (HCC) 11/26/2018   Osteoarthritis 11/26/2018   Sepsis-Biolateral PNa c Mod Risk PE in differential 10/17/2014   CAP (community acquired pneumonia) 10/17/2014   Pain in right foot 06/28/2013   Posterior tibial tendonitis of right leg 06/28/2013   Past Medical History:  Diagnosis Date   A-fib [facility])    Achalasia    Allergy    year around allergies   Arthritis    fingers, knees, neck, back-osteoarthristis   Bronchitis    hx of   Complication of anesthesia    Crohn's disease (HCC)    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   GERD (gastroesophageal reflux disease)    hx of reflux-went away with elevating HOB    Hypertension    Pneumonia    hx of    PONV (postoperative nausea and vomiting)    Type 2 diabetes mellitus (HCC) 08/02/2021    Past Surgical History:  Procedure Laterality Date   ABDOMINAL HYSTERECTOMY     ADENOIDECTOMY     ANKLE SURGERY     BIOPSY  03/10/2022   Procedure: BIOPSY;  Surgeon: Vida Rigger, MD;  Location: [facility] ENDOSCOPY;  Service: Gastroenterology;;   BRONCHIAL BRUSHINGS  08/03/2021   Procedure: BRONCHIAL BRUSHINGS;  Surgeon: Leslye Peer, MD;  Location: [facility] ENDOSCOPY;  Service: Cardiopulmonary;;   BRONCHIAL WASHINGS  08/03/2021   Procedure: BRONCHIAL WASHINGS;  Surgeon: Leslye Peer, MD;  Location: [facility] ENDOSCOPY;  Service: Cardiopulmonary;;   cosmetic surgeries     ESOPHAGEAL MANOMETRY N/A 12/02/2015   Procedure: ESOPHAGEAL MANOMETRY (EM);  Surgeon: Willis Modena, MD;  Location: [facility] ENDOSCOPY;  Service: Endoscopy;  Laterality: N/A;   ESOPHAGOGASTRODUODENOSCOPY N/A 12/02/2015   Procedure: ESOPHAGOGASTRODUODENOSCOPY (EGD);   Surgeon: Willis Modena, MD;  Location: [facility] ENDOSCOPY;  Service: Endoscopy;  Laterality: N/A;   FLEXIBLE SIGMOIDOSCOPY N/A 03/10/2022   Procedure: FLEXIBLE SIGMOIDOSCOPY;  Surgeon: Vida Rigger, MD;  Location[facility] ENDOSCOPY;  Service: Gastroenterology;  Laterality: N/A;   IR KYPHO THORACIC WITH BONE BIOPSY  11/22/2022   IR RADIOLOGIST EVAL & MGMT  11/08/2022   IR RADIOLOGIST EVAL & MGMT  12/19/2022   IR VERTEBROPLASTY CERV/THOR BX INC UNI/BIL INC/INJECT/IMAGING  10/05/2022   lymph node removal left leg     cat scratch surgery    ORIF ANKLE FRACTURE Right 09/03/2021   Procedure: OPEN REDUCTION INTERNAL FIXATION (ORIF) ANKLE FRACTURE;  Surgeon: Joen Laura, MD;  Location: [facility] OR;  Service: Orthopedics;  Laterality: Right;   THROAT SURGERY     sleep apnea surgery    TONSILLECTOMY     VIDEO BRONCHOSCOPY  08/03/2021   Procedure: VIDEO BRONCHOSCOPY WITH FLUORO;  Surgeon: Leslye Peer, MD;  Location: [facility] ENDOSCOPY;  Service: Cardiopulmonary;;    Current Outpatient Medications  Medication Sig Dispense Refill Last Dose/Taking   Accu-Chek Softclix Lancets lancets Use to test blood sugars up to 4 times daily as directed. 100 each 0    apixaban (ELIQUIS) 5 MG TABS tablet Take 1 tablet (5 mg total) by mouth 2 (two) times daily. 180 tablet 3    BENADRYL ALLERGY 25 MG tablet Take 25 mg by mouth at bedtime.      Blood Glucose Monitoring Suppl (BLOOD GLUCOSE MONITOR SYSTEM) w/Device KIT Use to test blood sugars up to 4 times daily. 1 kit 0    Continuous Blood Gluc Sensor (FREESTYLE LIBRE 2 SENSOR) MISC Inject 1 Device into the skin every 14 (fourteen) days.      CYLTEZO-CD/UC/HS STARTER 40 MG/0.8ML AJKT Inject 160 mLs into the skin every 14 (fourteen) days. Started 09/10/2022.      glucose blood (ACCU-CHEK GUIDE) test strip Use to test blood sugars up to 4 times daily as directed. 100 each 0    HUMALOG KWIKPEN 100 UNIT/ML KwikPen Inject 5 Units into the skin with breakfast, with lunch, and with  evening meal. (Patient taking differently: Inject 15 Units into the skin See admin instructions. Inject 15 units into the skin before breakfast and an additional 15 units once a day as needed for elevated BGL) 15 mL 11    insulin detemir (LEVEMIR) 100 UNIT/ML FlexPen Inject 10 Units into the skin daily. 15 mL 0    Insulin Pen Needle 32G X 4 MM MISC Use to inject insulin up to 4 times daily as directed. 100 each 0    lidocaine (LIDODERM) 5 % Place 1 patch onto the skin daily. Remove & Discard patch within 12 hours or as directed by MD 30 patch 0    losartan (COZAAR) 50 MG tablet Take 1 tablet (50 mg total) by mouth daily. 30 tablet 0    meclizine (ANTIVERT) 25 MG tablet Take 1 tablet (25 mg total) by mouth 3 (three) times daily as needed for dizziness. 30 tablet 0    Melatonin 10 MG TABS Take 10 mg by mouth at bedtime.      metFORMIN (GLUCOPHAGE) 500 MG tablet Take 500 mg by mouth  2 (two) times daily with a meal.      metoprolol succinate (TOPROL-XL) 100 MG 24 hr tablet Take 1 tablet (100 mg total) by mouth daily. Take with or immediately following a meal. 30 tablet 0    Multiple Vitamin (MULTIVITAMIN) tablet Take 1 tablet by mouth daily with breakfast.      naproxen (NAPROSYN) 375 MG tablet Take 1 tablet (375 mg total) by mouth 2 (two) times daily. 20 tablet 0    omeprazole (PRILOSEC) 20 MG capsule Take 20 mg by mouth daily.      ondansetron (ZOFRAN) 4 MG tablet Take 1 tablet (4 mg total) by mouth every 8 (eight) hours as needed for nausea or vomiting. 20 tablet 0    pantoprazole (PROTONIX) 40 MG tablet Take 1 tablet (40 mg total) by mouth 2 (two) times daily. (Patient not taking: Reported on 01/18/2023) 60 tablet 0    polyethylene glycol powder (GLYCOLAX/MIRALAX) 17 GM/SCOOP powder Take 17 g by mouth daily. 238 g 0    rosuvastatin (CRESTOR) 10 MG tablet Take 10 mg by mouth daily.      saccharomyces boulardii (FLORASTOR) 250 MG capsule Take 250 mg by mouth in the morning.      traMADol (ULTRAM) 50 MG  tablet Take 2 tablets (100 mg total) by mouth every 8 (eight) hours as needed for moderate pain. 20 tablet 0    TURMERIC PO Take 1 capsule by mouth at bedtime.      venlafaxine XR (EFFEXOR-XR) 37.5 MG 24 hr capsule Take 37.5 mg by mouth daily with breakfast.      No current facility-administered medications for this visit.   Allergies  Allergen Reactions   Latex Other (See Comments)    Blisters / skin peeling   Codeine Nausea And Vomiting and Other (See Comments)    Hallucinations also   Atenolol Cough   Other Itching, Swelling and Other (See Comments)    DermaPlast; Used after surgery - caused swelling, itching, and wound broke open   Hydrocodone Nausea And Vomiting   Oxycodone Nausea And Vomiting    Social History   Tobacco Use   Smoking status: Never   Smokeless tobacco: Never  Substance Use Topics   Alcohol use: No    Alcohol/week: 0.0 standard drinks of alcohol    Family History  Problem Relation Age of Onset   Lung cancer Father        1982 died from it   Hernia Mother    Breast cancer Maternal Aunt        multiple    Breast cancer Maternal Grandmother    Breast cancer Paternal Grandmother    Breast cancer Cousin        cousins multiple    Colon cancer Neg Hx    Colon polyps Neg Hx    Rectal cancer Neg Hx    Stomach cancer Neg Hx      Review of Systems  Musculoskeletal:  Positive for arthralgias.  All other systems reviewed and are negative.   Objective:  Physical Exam Constitutional:      General: She is not in acute distress.    Appearance: Normal appearance. She is not ill-appearing.  HENT:     Head: Normocephalic and atraumatic.     Right Ear: External ear normal.     Left Ear: External ear normal.     Nose: Nose normal.     Mouth/Throat:     Mouth: Mucous membranes are moist.     Pharynx:  Oropharynx is clear.  Eyes:     Extraocular Movements: Extraocular movements intact.     Conjunctiva/sclera: Conjunctivae normal.  Cardiovascular:      Rate and Rhythm: Normal rate and regular rhythm.     Pulses: Normal pulses.     Heart sounds: Normal heart sounds.  Pulmonary:     Effort: Pulmonary effort is normal.     Breath sounds: Normal breath sounds.  Abdominal:     General: Bowel sounds are normal.     Palpations: Abdomen is soft.     Tenderness: There is no abdominal tenderness.  Musculoskeletal:        General: Tenderness present.     Cervical back: Normal range of motion and neck supple.     Comments: TTP over medial and lateral joint line, medial worse than lateral.  No calf tenderness, swelling, or erythema.  No overlying lesions of area of chief complaint.  Decreased strength and ROM due to elicited pain.  Pre-operative ROM 0-120.  Dorsiflexion and plantarflexion intact.  Stable to varus and valgus stress.  BLE appear grossly neurovascularly intact.  Gait mildly antalgic.   Skin:    General: Skin is warm and dry.  Neurological:     Mental Status: She is alert and oriented to person, place, and time. Mental status is at baseline.  Psychiatric:        Mood and Affect: Mood normal.        Behavior: Behavior normal.     Vital signs in last 24 hours: @VSRANGES @  Labs:   Estimated body mass index is 30.25 kg/m as calculated from the following:   Height as of 01/27/23: 5\' 2"  (1.575 m).   Weight as of 03/27/23: 75 kg.   Imaging Review Plain radiographs demonstrate severe degenerative joint disease of the right knee(s). The overall alignment ismild varus. The bone quality appears to be fair for age and reported activity level.      Assessment/Plan:  End stage arthritis, right knee   The patient history, physical examination, clinical judgment of the provider and imaging studies are consistent with end stage degenerative joint disease of the right knee(s) and total knee arthroplasty is deemed medically necessary. The treatment options including medical management, injection therapy arthroscopy and arthroplasty were  discussed at length. The risks and benefits of total knee arthroplasty were presented and reviewed. The risks due to aseptic loosening, infection, stiffness, patella tracking problems, thromboembolic complications and other imponderables were discussed. The patient acknowledged the explanation, agreed to proceed with the plan and consent was signed. Patient is being admitted for inpatient treatment for surgery, pain control, PT, OT, prophylactic antibiotics, VTE prophylaxis, progressive ambulation and ADL's and discharge planning. The patient is planning to be discharged  with outpatient PT.    Anticipated LOS equal to or greater than 2 midnights due to - Age 61 and older with one or more of the following:  - Obesity  - Expected need for hospital services (PT, OT, Nursing) required for safe  discharge  - Anticipated need for postoperative skilled nursing care or inpatient rehab  - Active co-morbidities: Nonhodgkins (hx), Diabetes, HTN, GERD, paroxysmal Afib, severe PONV, Crohn's, interstitial lung disease  OR   - Unanticipated findings during/Post Surgery: None  - Patient is a high risk of re-admission due to: None

## 2023-04-09 NOTE — Patient Instructions (Signed)
SURGICAL WAITING ROOM VISITATION Patients having surgery or a procedure may have no more than 2 support people in the waiting area - these visitors may rotate in the visitor waiting room.   Due to an increase in RSV and influenza rates and associated hospitalizations, children ages 14 and under may not visit patients in [facility]. If the patient needs to stay at the hospital during part of their recovery, the visitor guidelines for inpatient rooms apply.  PRE-OP VISITATION  Pre-op nurse will coordinate an appropriate time for 1 support person to accompany the patient in pre-op.  This support person may not rotate.  This visitor will be contacted when the time is appropriate for the visitor to come back in the pre-op area.  Please refer to the [facility] website for the visitor guidelines for Inpatients (after your surgery is over and you are in a regular room).  You are not required to quarantine at this time prior to your surgery. However, you must do this: Hand Hygiene often Do NOT share personal items Notify your provider if you are in close contact with someone who has COVID or you develop fever 100.4 or greater, new onset of sneezing, cough, sore throat, shortness of breath or body aches.  If you test positive for Covid or have been in contact with anyone that has tested positive in the last 10 days please notify you surgeon.    Your procedure is scheduled on:  MONDAY  April 24, 2023  Report to [facility] Main Entrance: [address] entrance where the [employer] is available.   Report to admitting at:  08:15   AM  Call this number if you have any questions or problems the morning of surgery [phone]  Do not eat food after Midnight the night prior to your surgery/procedure.  After Midnight you may have the following liquids until 07:45 AM  DAY OF SURGERY  Clear Liquid Diet Water Black Coffee (sugar ok, NO MILK/CREAM OR CREAMERS)  Tea (sugar ok, NO  MILK/CREAM OR CREAMERS) regular and decaf                             Plain Jell-O  with no fruit (NO RED)                                           Fruit ices (not with fruit pulp, NO RED)                                     Popsicles (NO RED)                                                                  Juice: NO CITRUS JUICES: only apple, WHITE grape, WHITE cranberry Sports drinks like Gatorade or Powerade (NO RED)                    The day of surgery:  Drink ONE (1) Pre-Surgery G2 at  07:45      AM  the morning of surgery. Drink in one sitting. Do not sip.  This drink was given to you during your hospital pre-op appointment visit. Nothing else to drink after completing the Pre-Surgery G2 : No candy, chewing gum or throat lozenges.    FOLLOW ANY ADDITIONAL PRE OP INSTRUCTIONS YOU RECEIVED FROM YOUR SURGEON'S OFFICE!!!   Oral Hygiene is also important to reduce your risk of infection.        Remember - BRUSH YOUR TEETH THE MORNING OF SURGERY WITH YOUR REGULAR TOOTHPASTE  Do NOT smoke after Midnight the night before surgery.  STOP TAKING all Vitamins, Herbs and supplements 1 week before your surgery.   Diabetic medication:  Metformin-  Day before surgery:  Take as usual.    DAY OF SURGERY:  DO NOT TAKE METFORMIN.  Humalog Kwikpen-  only take if CBG >220 mg/dL may take 1/2 of correction dose.   ELIQUIS- Stop taking Eliquis 3 days before your surgery. Last dose will be taken on Thursday 04-20-23  Cyltezo-CD injection;  Don't inject this prior to surgery. Resume after surgery.   Take ONLY these medicines the morning of surgery with A SIP OF WATER: Tramadol, Venlafaxine (Effexor), metoprolol, omeprazole (Prilosec),                     You may not have any metal on your body including hair pins, jewelry, and body piercing  Do not wear make-up, lotions, powders, perfumes, or deodorant  Do not wear nail polish including gel and S&S, artificial / acrylic nails, or any other type  of covering on natural nails including finger and toenails. If you have artificial nails, gel coating, etc., that needs to be removed by a nail salon, Please have this removed prior to surgery. Not doing so may mean that your surgery could be cancelled or delayed if the Surgeon or anesthesia staff feels like they are unable to monitor you safely.   Do not shave 48 hours prior to surgery to avoid nicks in your skin which may contribute to postoperative infections.   Contacts, Hearing Aids, dentures or bridgework may not be worn into surgery. DENTURES WILL BE REMOVED PRIOR TO SURGERY PLEASE DO NOT APPLY "Poly grip" OR ADHESIVES!!!  You may bring a small overnight bag with you on the day of surgery, only pack items that are not valuable. [facility] IS NOT RESPONSIBLE   FOR VALUABLES THAT ARE LOST OR STOLEN.    Do not bring your home medications to the hospital. The Pharmacy will dispense medications listed on your medication list to you during your admission in the Hospital.  Special Instructions: Bring a copy of your healthcare power of attorney and living will documents the day of surgery, if you wish to have them scanned into your [facility]- EPIC  Please read over the following fact sheets you were given: IF YOU HAVE QUESTIONS ABOUT YOUR PRE-OP INSTRUCTIONS, PLEASE CALL [phone].     Pre-operative 5 CHG Bath Instructions   You can play a key role in reducing the risk of infection after surgery. Your skin needs to be as free of germs as possible. You can reduce the number of germs on your skin by washing with CHG (chlorhexidine gluconate) soap before surgery. CHG is an antiseptic soap that kills germs and continues to kill germs even after washing.   DO NOT use if you have an allergy to chlorhexidine/CHG or antibacterial soaps. If your skin becomes reddened or irritated, stop using the  CHG and notify one of our RNs at [phone]  Please shower with the CHG soap starting  4 days before surgery using the following schedule: START SHOWERS ON   THURSDAY  April 20, 2023                                                                                                                                                                              Please keep in mind the following:  DO NOT shave, including legs and underarms, starting the day of your first shower.   You may shave your face at any point before/day of surgery.   Place clean sheets on your bed the day you start using CHG soap. Use a clean washcloth (not used since being washed) for each shower. DO NOT sleep with pets once you start using the CHG.   CHG Shower Instructions:  If you choose to wash your hair and private area, wash first with your normal shampoo/soap.  After you use shampoo/soap, rinse your hair and body thoroughly to remove shampoo/soap residue.  Turn the water OFF and apply about 3 tablespoons (45 ml) of CHG soap to a CLEAN washcloth.  Apply CHG soap ONLY FROM YOUR NECK DOWN TO YOUR TOES (washing for 3-5 minutes)  DO NOT use CHG soap on face, private areas, open wounds, or sores.  Pay special attention to the area where your surgery is being performed.  If you are having back surgery, having someone wash your back for you may be helpful.  Wait 2 minutes after CHG soap is applied, then you may rinse off the CHG soap.  Pat dry with a clean towel  Put on clean clothes/pajamas   If you choose to wear lotion, please use ONLY the CHG-compatible lotions on the back of this paper.     Additional instructions for the day of surgery: DO NOT APPLY any lotions, deodorants, cologne, or perfumes.   Put on clean/comfortable clothes.  Brush your teeth.  Ask your nurse before applying any prescription medications to the skin.      CHG Compatible Lotions   Aveeno Moisturizing lotion  Cetaphil Moisturizing Cream  Cetaphil Moisturizing Lotion  Clairol Herbal Essence Moisturizing Lotion, Dry  Skin  Clairol Herbal Essence Moisturizing Lotion, Extra Dry Skin  Clairol Herbal Essence Moisturizing Lotion, Normal Skin  Curel Age Defying Therapeutic Moisturizing Lotion with Alpha Hydroxy  Curel Extreme Care Body Lotion  Curel Soothing Hands Moisturizing Hand Lotion  Curel Therapeutic Moisturizing Cream, Fragrance-Free  Curel Therapeutic Moisturizing Lotion, Fragrance-Free  Curel Therapeutic Moisturizing Lotion, Original Formula  Eucerin Daily Replenishing Lotion  Eucerin Dry Skin Therapy Plus Alpha Hydroxy Crme  Eucerin Dry Skin Therapy Plus Alpha Hydroxy Lotion  Eucerin Original Crme  Eucerin Original Lotion  Eucerin Plus Crme Eucerin Plus Lotion  Eucerin TriLipid Replenishing Lotion  Keri Anti-Bacterial Hand Lotion  Keri Deep Conditioning Original Lotion Dry Skin Formula Softly Scented  Keri Deep Conditioning Original Lotion, Fragrance Free Sensitive Skin Formula  Keri Lotion Fast Absorbing Fragrance Free Sensitive Skin Formula  Keri Lotion Fast Absorbing Softly Scented Dry Skin Formula  Keri Original Lotion  Keri Skin Renewal Lotion Keri Silky Smooth Lotion  Keri Silky Smooth Sensitive Skin Lotion  Nivea Body Creamy Conditioning Oil  Nivea Body Extra Enriched Lotion  Nivea Body Original Lotion  Nivea Body Sheer Moisturizing Lotion Nivea Crme  Nivea Skin Firming Lotion  NutraDerm 30 Skin Lotion  NutraDerm Skin Lotion  NutraDerm Therapeutic Skin Cream  NutraDerm Therapeutic Skin Lotion  ProShield Protective Hand Cream  Provon moisturizing lotion   FAILURE TO FOLLOW THESE INSTRUCTIONS MAY RESULT IN THE CANCELLATION OF YOUR SURGERY  PATIENT SIGNATURE_________________________________  NURSE SIGNATURE__________________________________  ________________________________________________________________________     Meghan Alexander    An incentive spirometer is a tool that can help keep your lungs clear and active. This tool measures how well you are filling  your lungs with each breath. Taking long deep breaths may help reverse or decrease the chance of developing breathing (pulmonary) problems (especially infection) following: A long period of time when you are unable to move or be active. BEFORE THE PROCEDURE  If the spirometer includes an indicator to show your best effort, your nurse or respiratory therapist will set it to a desired goal. If possible, sit up straight or lean slightly forward. Try not to slouch. Hold the incentive spirometer in an upright position. INSTRUCTIONS FOR USE  Sit on the edge of your bed if possible, or sit up as far as you can in bed or on a chair. Hold the incentive spirometer in an upright position. Breathe out normally. Place the mouthpiece in your mouth and seal your lips tightly around it. Breathe in slowly and as deeply as possible, raising the piston or the ball toward the top of the column. Hold your breath for 3-5 seconds or for as long as possible. Allow the piston or ball to fall to the bottom of the column. Remove the mouthpiece from your mouth and breathe out normally. Rest for a few seconds and repeat Steps 1 through 7 at least 10 times every 1-2 hours when you are awake. Take your time and take a few normal breaths between deep breaths. The spirometer may include an indicator to show your best effort. Use the indicator as a goal to work toward during each repetition. After each set of 10 deep breaths, practice coughing to be sure your lungs are clear. If you have an incision (the cut made at the time of surgery), support your incision when coughing by placing a pillow or rolled up towels firmly against it. Once you are able to get out of bed, walk around indoors and cough well. You may stop using the incentive spirometer when instructed by your caregiver.  RISKS AND COMPLICATIONS Take your time so you do not get dizzy or light-headed. If you are in pain, you may need to take or ask for pain medication  before doing incentive spirometry. It is harder to take a deep breath if you are having pain. AFTER USE Rest and breathe slowly and easily. It can be helpful to keep track of a log of your progress. Your caregiver can provide you with a simple table to  help with this. If you are using the spirometer at home, follow these instructions: SEEK MEDICAL CARE IF:  You are having difficultly using the spirometer. You have trouble using the spirometer as often as instructed. Your pain medication is not giving enough relief while using the spirometer. You develop fever of 100.5 F (38.1 C) or higher.                                                                                                    SEEK IMMEDIATE MEDICAL CARE IF:  You cough up bloody sputum that had not been present before. You develop fever of 102 F (38.9 C) or greater. You develop worsening pain at or near the incision site. MAKE SURE YOU:  Understand these instructions. Will watch your condition. Will get help right away if you are not doing well or get worse. Document Released: 08/22/2006 Document Revised: 07/04/2011 Document Reviewed: 10/23/2006 [facility] Patient Information 2014 [place], [place].      WHAT IS A BLOOD TRANSFUSION? Blood Transfusion Information  A transfusion is the replacement of blood or some of its parts. Blood is made up of multiple cells which provide different functions. Red blood cells carry oxygen and are used for blood loss replacement. White blood cells fight against infection. Platelets control bleeding. Plasma helps clot blood. Other blood products are available for specialized needs, such as hemophilia or other clotting disorders. BEFORE THE TRANSFUSION  Who gives blood for transfusions?  Healthy volunteers who are fully evaluated to make sure their blood is safe. This is blood bank blood. Transfusion therapy is the safest it has ever been in the practice of medicine. Before blood is taken  from a donor, a complete history is taken to make sure that person has no history of diseases nor engages in risky social behavior (examples are intravenous drug use or sexual activity with multiple partners). The donor's travel history is screened to minimize risk of transmitting infections, such as malaria. The donated blood is tested for signs of infectious diseases, such as HIV and hepatitis. The blood is then tested to be sure it is compatible with you in order to minimize the chance of a transfusion reaction. If you or a relative donates blood, this is often done in anticipation of surgery and is not appropriate for emergency situations. It takes many days to process the donated blood. RISKS AND COMPLICATIONS Although transfusion therapy is very safe and saves many lives, the main dangers of transfusion include:  Getting an infectious disease. Developing a transfusion reaction. This is an allergic reaction to something in the blood you were given. Every precaution is taken to prevent this. The decision to have a blood transfusion has been considered carefully by your caregiver before blood is given. Blood is not given unless the benefits outweigh the risks. AFTER THE TRANSFUSION Right after receiving a blood transfusion, you will usually feel much better and more energetic. This is especially true if your red blood cells have gotten low (anemic). The transfusion raises the level of the red blood cells which carry oxygen, and this usually  causes an energy increase. The nurse administering the transfusion will monitor you carefully for complications. HOME CARE INSTRUCTIONS  No special instructions are needed after a transfusion. You may find your energy is better. Speak with your caregiver about any limitations on activity for underlying diseases you may have. SEEK MEDICAL CARE IF:  Your condition is not improving after your transfusion. You develop redness or irritation at the intravenous (IV)  site. SEEK IMMEDIATE MEDICAL CARE IF:  Any of the following symptoms occur over the next 12 hours: Shaking chills. You have a temperature by mouth above 102 F (38.9 C), not controlled by medicine. Chest, back, or muscle pain. People around you feel you are not acting correctly or are confused. Shortness of breath or difficulty breathing. Dizziness and fainting. You get a rash or develop hives. You have a decrease in urine output. Your urine turns a dark color or changes to pink, red, or brown. Any of the following symptoms occur over the next 10 days: You have a temperature by mouth above 102 F (38.9 C), not controlled by medicine. Shortness of breath. Weakness after normal activity. The white part of the eye turns yellow (jaundice). You have a decrease in the amount of urine or are urinating less often. Your urine turns a dark color or changes to pink, red, or brown. Document Released: 04/08/2000 Document Revised: 07/04/2011 Document Reviewed: 11/26/2007 [facility] Patient Information 2014 [place], [place].  _______________________________________________________________________

## 2023-04-09 NOTE — Progress Notes (Signed)
COVID Vaccine received:  []  No [x]  Yes Date of any COVID positive Test in last 90 days:  PCP - Burton Apley, MD at [facility] Cardiologist - Weston Brass, MD  Bernadene Person, NP  cardiac clearance in 01-18-23 Epic note Hematology- Wyvonnia Lora, MD  clearance in 03-27-23 Epic note  Chest x-ray -  CT Chest w/o Contrast 03-15-23  Epic EKG - 01-30-2023  Epic  Stress Test -  ECHO - 10-06-2022  Epic Cardiac Cath -   PCR screen: [x]  Ordered & Completed []   No Order but Needs PROFEND     []   N/A for this surgery  Surgery Plan:  []  Ambulatory   [x]  Outpatient in bed  []  Admit Anesthesia:    []  General  []  Spinal  [x]   Choice []   MAC  Pacemaker / ICD device [x]  No []  Yes   Spinal Cord Stimulator:[x]  No []  Yes       History of Sleep Apnea? []  No [x]  Yes   CPAP used?- []  No []  Yes  ?surgery  Does the patient monitor blood sugar?   []  N/A   []  No []  Yes  Patient has: []  NO Hx DM   []  Pre-DM   []  DM1  [x]   DM2 Last A1c was:  7.9  on   07-31-2022   Does patient have a [employer] or Dexacom? []  No [x]  Yes   Fasting Blood Sugar Ranges-  Checks Blood Sugar _____ times a day Other Diabetic medications/ instructions:  Insulin Levemir-  Humalog Kwikpen- Metformin   Blood Thinner / Instructions:Eliquis  OK to hold x 7 days per Bernadene Person, PA Aspirin Instructions:  None  ERAS Protocol Ordered: []  No  [x]  Yes PRE-SURGERY []  ENSURE  [x]  G2   Patient is to be NPO after: 07:45  Dental hx: []  Dentures:  []  N/A      []  Bridge or Partial:                   []  Loose or Damaged teeth:   Comments: Patient was given the 5 CHG shower / bath instructions for TKA surgery along with 2 bottles of the CHG soap. Patient will start this on: THursday 04-20-2023  All questions were asked and answered, Patient voiced understanding of this process.   Activity level: Patient is able / unable to climb a flight of stairs without difficulty; []  No CP  []  No SOB, but would have ___   Patient can / can not perform ADLs  without assistance.   Anesthesia review: DM2, Lymphoma, HTN, A.fib, Crohn's, PONV,   OSA-  ?surgery  Patient denies shortness of breath, fever, cough and chest pain at PAT appointment.  Patient verbalized understanding and agreement to the Pre-Surgical Instructions that were given to them at this PAT appointment. Patient was also educated of the need to review these PAT instructions again prior to her surgery.I reviewed the appropriate phone numbers to call if they have any and questions or concerns.

## 2023-04-11 ENCOUNTER — Encounter (HOSPITAL_COMMUNITY)
Admission: RE | Admit: 2023-04-11 | Discharge: 2023-04-11 | Disposition: A | Discharge status: Home / Self Care | Payer: Medicare Other | Source: Ambulatory Visit | Attending: Orthopedic Surgery | Admitting: Orthopedic Surgery

## 2023-04-11 ENCOUNTER — Encounter (HOSPITAL_COMMUNITY): Payer: Self-pay

## 2023-04-11 ENCOUNTER — Other Ambulatory Visit: Payer: Self-pay

## 2023-04-11 VITALS — BP 148/84 | HR 82 | Temp 98.5°F | Resp 18 | Ht 62.0 in | Wt 162.0 lb

## 2023-04-11 DIAGNOSIS — I4891 Unspecified atrial fibrillation: Secondary | ICD-10-CM | POA: Insufficient documentation

## 2023-04-11 DIAGNOSIS — Z7984 Long term (current) use of oral hypoglycemic drugs: Secondary | ICD-10-CM | POA: Diagnosis not present

## 2023-04-11 DIAGNOSIS — Z794 Long term (current) use of insulin: Secondary | ICD-10-CM | POA: Insufficient documentation

## 2023-04-11 DIAGNOSIS — I10 Essential (primary) hypertension: Secondary | ICD-10-CM | POA: Insufficient documentation

## 2023-04-11 DIAGNOSIS — M1711 Unilateral primary osteoarthritis, right knee: Secondary | ICD-10-CM | POA: Insufficient documentation

## 2023-04-11 DIAGNOSIS — Z01818 Encounter for other preprocedural examination: Secondary | ICD-10-CM

## 2023-04-11 DIAGNOSIS — G8929 Other chronic pain: Secondary | ICD-10-CM

## 2023-04-11 DIAGNOSIS — Z01812 Encounter for preprocedural laboratory examination: Secondary | ICD-10-CM | POA: Diagnosis present

## 2023-04-11 DIAGNOSIS — E1165 Type 2 diabetes mellitus with hyperglycemia: Secondary | ICD-10-CM | POA: Diagnosis not present

## 2023-04-11 HISTORY — DX: Cardiac arrhythmia, unspecified: I49.9

## 2023-04-11 HISTORY — DX: Nontoxic single thyroid nodule: E04.1

## 2023-04-11 LAB — COMPREHENSIVE METABOLIC PANEL
ALT: 19 U/L (ref 0–44)
AST: 19 U/L (ref 15–41)
Albumin: 4.2 g/dL (ref 3.5–5.0)
Alkaline Phosphatase: 71 U/L (ref 38–126)
Anion gap: 10 (ref 5–15)
BUN: 25 mg/dL — ABNORMAL HIGH (ref 8–23)
CO2: 27 mmol/L (ref 22–32)
Calcium: 9.1 mg/dL (ref 8.9–10.3)
Chloride: 102 mmol/L (ref 98–111)
Creatinine, Ser: 1.23 mg/dL — ABNORMAL HIGH (ref 0.44–1.00)
GFR, Estimated: 48 mL/min — ABNORMAL LOW (ref >60)
Glucose, Bld: 141 mg/dL — ABNORMAL HIGH (ref 70–99)
Potassium: 3.8 mmol/L (ref 3.5–5.1)
Sodium: 139 mmol/L (ref 135–145)
Total Bilirubin: 0.4 mg/dL (ref <1.2)
Total Protein: 6.7 g/dL (ref 6.5–8.1)

## 2023-04-11 LAB — CBC WITH DIFFERENTIAL/PLATELET
Abs Immature Granulocytes: 0.04 10*3/uL (ref 0.00–0.07)
Basophils Absolute: 0.1 10*3/uL (ref 0.0–0.1)
Basophils Relative: 1 %
Eosinophils Absolute: 0.3 10*3/uL (ref 0.0–0.5)
Eosinophils Relative: 3 %
HCT: 38.4 % (ref 36.0–46.0)
Hemoglobin: 12.8 g/dL (ref 12.0–15.0)
Immature Granulocytes: 0 %
Lymphocytes Relative: 19 %
Lymphs Abs: 1.7 10*3/uL (ref 0.7–4.0)
MCH: 29.1 pg (ref 26.0–34.0)
MCHC: 33.3 g/dL (ref 30.0–36.0)
MCV: 87.3 fL (ref 80.0–100.0)
Monocytes Absolute: 0.8 10*3/uL (ref 0.1–1.0)
Monocytes Relative: 9 %
Neutro Abs: 6.2 10*3/uL (ref 1.7–7.7)
Neutrophils Relative %: 68 %
Platelets: 222 10*3/uL (ref 150–400)
RBC: 4.4 MIL/uL (ref 3.87–5.11)
RDW: 13.9 % (ref 11.5–15.5)
WBC: 9.1 10*3/uL (ref 4.0–10.5)
nRBC: 0 % (ref 0.0–0.2)

## 2023-04-11 LAB — TYPE AND SCREEN
ABO/RH(D): A POS
Antibody Screen: NEGATIVE

## 2023-04-11 LAB — HEMOGLOBIN A1C
Hgb A1c MFr Bld: 6.1 % — ABNORMAL HIGH (ref 4.8–5.6)
Mean Plasma Glucose: 128.37 mg/dL

## 2023-04-11 LAB — SURGICAL PCR SCREEN
MRSA, PCR: NEGATIVE
Staphylococcus aureus: NEGATIVE

## 2023-04-11 LAB — GLUCOSE, CAPILLARY: Glucose-Capillary: 134 mg/dL — ABNORMAL HIGH (ref 70–99)

## 2023-04-12 NOTE — Progress Notes (Signed)
Anesthesia Chart Review   Case: [id number] Date/Time: 04/24/23 1030   Procedure: TOTAL KNEE ARTHROPLASTY (Right: Knee)   Anesthesia type: Choice   Pre-op diagnosis: OA RIGHT KNEE   Location: [facility] ROOM 07 / [facility]   Surgeons: Joen Laura, MD       DISCUSSION:67 y.o. never smoker with h/o PONV, HTN, DM II, atrial fibrillation, Follicular lymphoma grade II, OA right knee scheduled for above procedure 04/24/2023 with Dr. Weber Cooks.   Pt last seen by cardiology 01/18/2023. Per OV note, " According to the Revised Cardiac Risk Index (RCRI), her Perioperative Risk of Major Cardiac Event is (%): 0.9. Her Functional Capacity in METs is: 4.4 according to the Duke Activity Status Index (DASI).  Her activity is limited in the setting of orthopedic concerns (patient has broken her back and has had multiple surgeries, she has significant arthritis.  Therefore, based on ACC/AHA guidelines, patient would be at acceptable risk for the planned procedure without further cardiovascular testing.  I will reach out to our pharmacy team for recommendations on holding Eliquis prior to surgery.  Once I receive recommendations, I will finalize surgical clearance and forward these recommendations to the requesting party."  Pt advised to hold Eliquis 3 days prior to surgery.  VS: BP (!) 148/84 Comment: right arm sitting  Pulse 82   Temp 36.9 C (Oral)   Resp 18   Ht 5\' 2"  (1.575 m)   Wt 73.5 kg   SpO2 96%   BMI 29.63 kg/m   PROVIDERS: Burton Apley, MD is PCP   Cardiologist - Weston Brass, MD   LABS: Labs reviewed: Acceptable for surgery. (all labs ordered are listed, but only abnormal results are displayed)  Labs Reviewed  COMPREHENSIVE METABOLIC PANEL - Abnormal; Notable for the following components:      Result Value   Glucose, Bld 141 (*)    BUN 25 (*)    Creatinine, Ser 1.23 (*)    GFR, Estimated 48 (*)    All other components within normal limits  HEMOGLOBIN A1C - Abnormal;  Notable for the following components:   Hgb A1c MFr Bld 6.1 (*)    All other components within normal limits  GLUCOSE, CAPILLARY - Abnormal; Notable for the following components:   Glucose-Capillary 134 (*)    All other components within normal limits  SURGICAL PCR SCREEN  CBC WITH DIFFERENTIAL/PLATELET  TYPE AND SCREEN     IMAGES:   EKG:   CV: Echo 10/06/2022 1. Left ventricular ejection fraction, by estimation, is 55 to 60%. The  left ventricle has normal function. The left ventricle has no regional  wall motion abnormalities. Indeterminate diastolic filling due to E-A  fusion.   2. Right ventricular systolic function is normal. The right ventricular  size is normal.   3. The mitral valve is normal in structure. Mild mitral valve  regurgitation. No evidence of mitral stenosis.   4. The aortic valve is normal in structure. Aortic valve regurgitation is  not visualized. No aortic stenosis is present.   5. The inferior vena cava is normal in size with greater than 50%  respiratory variability, suggesting right atrial pressure of 3 mmHg.   Past Medical History:  Diagnosis Date   A-fib [facility])    Achalasia    Allergy    year around allergies   Arthritis    fingers, knees, neck, back-osteoarthristis   Bronchitis    hx of   Complication of anesthesia    Crohn's disease (HCC)  Dysrhythmia    a. 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   GERD (gastroesophageal reflux disease)    hx of reflux-went away with elevating HOB    Hypertension    Pneumonia    hx of    PONV (postoperative nausea and vomiting)    Thyroid nodule    mulitple nodules that are being monitored   Type 2 diabetes mellitus (HCC) 08/02/2021    Past Surgical History:  Procedure Laterality Date   ABDOMINAL HYSTERECTOMY     ADENOIDECTOMY     ANKLE SURGERY Right    BIOPSY  03/10/2022   Procedure: BIOPSY;  Surgeon: Vida Rigger, MD;  Location: [facility] ENDOSCOPY;   Service: Gastroenterology;;   BRONCHIAL BRUSHINGS  08/03/2021   Procedure: BRONCHIAL BRUSHINGS;  Surgeon: Leslye Peer, MD;  Location: [facility] ENDOSCOPY;  Service: Cardiopulmonary;;   BRONCHIAL WASHINGS  08/03/2021   Procedure: BRONCHIAL WASHINGS;  Surgeon: Leslye Peer, MD;  Location: [facility] ENDOSCOPY;  Service: Cardiopulmonary;;   cosmetic surgeries     brow lift and lipsuction on abdominal, breast implants   ESOPHAGEAL MANOMETRY N/A 12/02/2015   Procedure: ESOPHAGEAL MANOMETRY (EM);  Surgeon: Willis Modena, MD;  Location: [facility] ENDOS[facility];  Service: Endoscopy;  Laterality: N/A;   ESOPHAGOGASTRODUODENOSCOPY N/A 12/02/2015   Procedure: ESOPHAGOGASTRODUODENOSCOPY (EGD);  Surgeon: Willis Modena, MD;  Location: [facility] ENDOSCOPY;  Service: Endoscopy;  Laterality: N/A;   FLEXIBLE SIGMOIDOSCOPY N/A 03/10/2022   Procedure: FLEXIBLE SIGMOIDOSCOPY;  Surgeon: Vida Rigger, MD;  Location: [facility] ENDOSCOPY;  Service: Gastroenterology;  Laterality: N/A;   IR KYPHO THORACIC WITH BONE BIOPSY  11/22/2022   IR RADIOLOGIST EVAL & MGMT  11/08/2022   IR RADIOLOGIST EVAL & MGMT  12/19/2022   IR VERTEBROPLASTY CERV/THOR BX INC UNI/BIL INC/INJECT/IMAGING  10/05/2022   lymph node removal left leg     cat scratch surgery    ORIF ANKLE FRACTURE Right 09/03/2021   Procedure: OPEN REDUCTION INTERNAL FIXATION (ORIF) ANKLE FRACTURE;  Surgeon: Joen Laura, MD;  Location: [facility] OR;  Service: Orthopedics;  Laterality: Right;   THROAT SURGERY     UPPP for sleep apnea   TONSILLECTOMY     VIDEO BRONCHOSCOPY  08/03/2021   Procedure: VIDEO BRONCHOSCOPY WITH FLUORO;  Surgeon: Leslye Peer, MD;  Location: [facility] ENDOSCOPY;  Service: Cardiopulmonary;;    MEDICATIONS:  Accu-Chek Softclix Lancets lancets   apixaban (ELIQUIS) 5 MG TABS tablet   BENADRYL ALLERGY 25 MG tablet   Blood Glucose Monitoring Suppl (BLOOD GLUCOSE MONITOR SYSTEM) w/Device KIT   Continuous Blood Gluc Sensor (FREESTYLE LIBRE 2 SENSOR) MISC   CYLTEZO-CD/UC/HS  STARTER 40 MG/0.8ML AJKT   glucose blood (ACCU-CHEK GUIDE) test strip   HUMALOG KWIKPEN 100 UNIT/ML KwikPen   insulin detemir (LEVEMIR) 100 UNIT/ML FlexPen   Insulin Pen Needle 32G X 4 MM MISC   lidocaine (LIDODERM) 5 %   losartan (COZAAR) 50 MG tablet   meclizine (ANTIVERT) 25 MG tablet   Melatonin 10 MG TABS   meloxicam (MOBIC) 15 MG tablet   metFORMIN (GLUCOPHAGE) 500 MG tablet   metoprolol succinate (TOPROL-XL) 100 MG 24 hr tablet   Multiple Vitamin (MULTIVITAMIN) tablet   naproxen (NAPROSYN) 375 MG tablet   omeprazole (PRILOSEC) 20 MG capsule   ondansetron (ZOFRAN) 4 MG tablet   pantoprazole (PROTONIX) 40 MG tablet   polyethylene glycol powder (GLYCOLAX/MIRALAX) 17 GM/SCOOP powder   rosuvastatin (CRESTOR) 10 MG tablet   saccharomyces boulardii (FLORASTOR) 250 MG capsule   traMADol (ULTRAM)  50 MG tablet   TURMERIC PO   venlafaxine XR (EFFEXOR-XR) 37.5 MG 24 hr capsule   No current facility-administered medications for this encounter.     Jodell Cipro Ward, PA-C [facility] [phone]

## 2023-04-12 NOTE — Anesthesia Preprocedure Evaluation (Addendum)
Anesthesia Evaluation  Patient identified by MRN, date of birth, ID band Patient awake    Reviewed: Allergy & Precautions, NPO status , Patient's Chart, lab work & pertinent test results, reviewed documented beta blocker date and time   History of Anesthesia Complications (+) PONV and history of anesthetic complications  Airway Mallampati: II  TM Distance: >3 FB     Dental  (+) Dental Advisory Given, Teeth Intact   Pulmonary shortness of breath, pneumonia   Pulmonary exam normal        Cardiovascular hypertension, Pt. on medications and Pt. on home beta blockers Normal cardiovascular exam  Echo 09/2021  1. Left ventricular ejection fraction, by estimation, is 55 to 60%. The left ventricle has normal function. The left ventricle has no regional wall motion abnormalities. Left ventricular diastolic parameters are indeterminate.   2. Right ventricular systolic function is normal. The right ventricular size is normal.   3. The mitral valve is abnormal. Trivial mitral valve regurgitation. No evidence of mitral stenosis.   4. The aortic valve is tricuspid. Aortic valve regurgitation is not visualized. No aortic stenosis is present.   5. The inferior vena cava is dilated in size with >50% respiratory variability, suggesting right atrial pressure of 8 mmHg.     Neuro/Psych    GI/Hepatic Neg liver ROS,GERD  ,,  Endo/Other  diabetes, Type 2, Insulin Dependent, Oral Hypoglycemic Agents    Renal/GU Renal InsufficiencyRenal disease     Musculoskeletal  (+) Arthritis ,    Abdominal Normal abdominal exam  (+)   Peds  Hematology   Anesthesia Other Findings   Reproductive/Obstetrics                             Anesthesia Physical Anesthesia Plan  ASA: 3  Anesthesia Plan: Spinal   Post-op Pain Management: Regional block*   Induction:   PONV Risk Score and Plan: 4 or greater and Propofol infusion,  Treatment may vary due to age or medical condition, TIVA, Ondansetron and Dexamethasone  Airway Management Planned: Simple Face Mask and Natural Airway  Additional Equipment: None  Intra-op Plan:   Post-operative Plan:   Informed Consent: I have reviewed the patients History and Physical, chart, labs and discussed the procedure including the risks, benefits and alternatives for the proposed anesthesia with the patient or authorized representative who has indicated his/her understanding and acceptance.       Plan Discussed with: CRNA  Anesthesia Plan Comments: (See PAT note 04/11/2023)        Anesthesia Quick Evaluation

## 2023-04-24 ENCOUNTER — Other Ambulatory Visit: Payer: Self-pay

## 2023-04-24 ENCOUNTER — Observation Stay (HOSPITAL_COMMUNITY)
Admission: RE | Admit: 2023-04-24 | Discharge: 2023-04-25 | Disposition: A | Discharge status: Home / Self Care | Payer: Medicare Other | Attending: Orthopedic Surgery | Admitting: Orthopedic Surgery

## 2023-04-24 ENCOUNTER — Encounter (HOSPITAL_COMMUNITY): Payer: Self-pay | Admitting: Orthopedic Surgery

## 2023-04-24 ENCOUNTER — Observation Stay (HOSPITAL_COMMUNITY): Payer: Medicare Other

## 2023-04-24 ENCOUNTER — Ambulatory Visit (HOSPITAL_COMMUNITY): Payer: Medicare Other | Admitting: Physician Assistant

## 2023-04-24 ENCOUNTER — Encounter (HOSPITAL_COMMUNITY)
Admission: RE | Disposition: A | Discharge status: Home / Self Care | Payer: Self-pay | Source: Home / Self Care | Attending: Orthopedic Surgery

## 2023-04-24 ENCOUNTER — Ambulatory Visit (HOSPITAL_COMMUNITY): Payer: Medicare Other

## 2023-04-24 DIAGNOSIS — Z91040 Latex allergy status: Secondary | ICD-10-CM | POA: Insufficient documentation

## 2023-04-24 DIAGNOSIS — M1711 Unilateral primary osteoarthritis, right knee: Secondary | ICD-10-CM

## 2023-04-24 DIAGNOSIS — Z01818 Encounter for other preprocedural examination: Secondary | ICD-10-CM

## 2023-04-24 DIAGNOSIS — I4891 Unspecified atrial fibrillation: Secondary | ICD-10-CM | POA: Insufficient documentation

## 2023-04-24 DIAGNOSIS — Z794 Long term (current) use of insulin: Secondary | ICD-10-CM

## 2023-04-24 DIAGNOSIS — Z7901 Long term (current) use of anticoagulants: Secondary | ICD-10-CM | POA: Insufficient documentation

## 2023-04-24 DIAGNOSIS — E1165 Type 2 diabetes mellitus with hyperglycemia: Secondary | ICD-10-CM | POA: Diagnosis not present

## 2023-04-24 DIAGNOSIS — E119 Type 2 diabetes mellitus without complications: Secondary | ICD-10-CM | POA: Diagnosis not present

## 2023-04-24 DIAGNOSIS — Z8572 Personal history of non-Hodgkin lymphomas: Secondary | ICD-10-CM | POA: Insufficient documentation

## 2023-04-24 DIAGNOSIS — Z7984 Long term (current) use of oral hypoglycemic drugs: Secondary | ICD-10-CM | POA: Insufficient documentation

## 2023-04-24 DIAGNOSIS — Z79899 Other long term (current) drug therapy: Secondary | ICD-10-CM | POA: Diagnosis not present

## 2023-04-24 DIAGNOSIS — I10 Essential (primary) hypertension: Secondary | ICD-10-CM | POA: Diagnosis not present

## 2023-04-24 HISTORY — PX: TOTAL KNEE ARTHROPLASTY: SHX125

## 2023-04-24 LAB — GLUCOSE, CAPILLARY
Glucose-Capillary: 109 mg/dL — ABNORMAL HIGH (ref 70–99)
Glucose-Capillary: 109 mg/dL — ABNORMAL HIGH (ref 70–99)
Glucose-Capillary: 144 mg/dL — ABNORMAL HIGH (ref 70–99)
Glucose-Capillary: 180 mg/dL — ABNORMAL HIGH (ref 70–99)
Glucose-Capillary: 274 mg/dL — ABNORMAL HIGH (ref 70–99)

## 2023-04-24 SURGERY — ARTHROPLASTY, KNEE, TOTAL
Anesthesia: Spinal | Site: Knee | Laterality: Right

## 2023-04-24 MED ORDER — CEFAZOLIN SODIUM-DEXTROSE 2-4 GM/100ML-% IV SOLN
2.0000 g | Freq: Four times a day (QID) | INTRAVENOUS | Status: AC
  Administered 2023-04-24 (×2): 2 g via INTRAVENOUS
  Filled 2023-04-24 (×2): qty 100

## 2023-04-24 MED ORDER — PANTOPRAZOLE SODIUM 40 MG PO TBEC
40.0000 mg | DELAYED_RELEASE_TABLET | Freq: Every day | ORAL | Status: DC
  Administered 2023-04-24 – 2023-04-25 (×2): 40 mg via ORAL
  Filled 2023-04-24 (×2): qty 1

## 2023-04-24 MED ORDER — OXYCODONE HCL 5 MG PO TABS: 10.0000 mg | ORAL_TABLET | ORAL | Status: DC | PRN

## 2023-04-24 MED ORDER — DEXAMETHASONE SODIUM PHOSPHATE 10 MG/ML IJ SOLN
INTRAMUSCULAR | Status: DC | PRN
  Administered 2023-04-24: 10 mg

## 2023-04-24 MED ORDER — ACETAMINOPHEN 500 MG PO TABS
1000.0000 mg | ORAL_TABLET | Freq: Four times a day (QID) | ORAL | Status: AC
  Administered 2023-04-24 – 2023-04-25 (×4): 1000 mg via ORAL
  Filled 2023-04-24 (×4): qty 2

## 2023-04-24 MED ORDER — METHOCARBAMOL 500 MG PO TABS
500.0000 mg | ORAL_TABLET | Freq: Four times a day (QID) | ORAL | Status: DC | PRN
  Administered 2023-04-24: 500 mg via ORAL
  Filled 2023-04-24: qty 1

## 2023-04-24 MED ORDER — ASPIRIN 81 MG PO CHEW
81.0000 mg | CHEWABLE_TABLET | Freq: Two times a day (BID) | ORAL | Status: DC
  Administered 2023-04-24: 81 mg via ORAL
  Filled 2023-04-24: qty 1

## 2023-04-24 MED ORDER — INSULIN ASPART 100 UNIT/ML IJ SOLN
5.0000 [IU] | Freq: Three times a day (TID) | INTRAMUSCULAR | Status: DC
  Administered 2023-04-24 – 2023-04-25 (×3): 5 [IU] via SUBCUTANEOUS

## 2023-04-24 MED ORDER — DOCUSATE SODIUM 100 MG PO CAPS
100.0000 mg | ORAL_CAPSULE | Freq: Two times a day (BID) | ORAL | Status: DC
  Filled 2023-04-24: qty 1

## 2023-04-24 MED ORDER — MELOXICAM 15 MG PO TABS: 15.0000 mg | ORAL_TABLET | Freq: Every day | ORAL | 1 refills | Status: AC

## 2023-04-24 MED ORDER — TRANEXAMIC ACID-NACL 1000-0.7 MG/100ML-% IV SOLN
1000.0000 mg | INTRAVENOUS | Status: AC
  Administered 2023-04-24: 1000 mg via INTRAVENOUS
  Filled 2023-04-24: qty 100

## 2023-04-24 MED ORDER — PHENYLEPHRINE 80 MCG/ML (10ML) SYRINGE FOR IV PUSH (FOR BLOOD PRESSURE SUPPORT)
PREFILLED_SYRINGE | INTRAVENOUS | Status: DC | PRN
  Administered 2023-04-24 (×2): 40 ug via INTRAVENOUS

## 2023-04-24 MED ORDER — ROSUVASTATIN CALCIUM 10 MG PO TABS
10.0000 mg | ORAL_TABLET | Freq: Every day | ORAL | Status: DC
  Administered 2023-04-25: 10 mg via ORAL
  Filled 2023-04-24: qty 1

## 2023-04-24 MED ORDER — POVIDONE-IODINE 10 % EX SWAB: 2.0000 | Freq: Once | CUTANEOUS | Status: DC

## 2023-04-24 MED ORDER — METFORMIN HCL 500 MG PO TABS
500.0000 mg | ORAL_TABLET | Freq: Two times a day (BID) | ORAL | Status: DC
  Administered 2023-04-25: 500 mg via ORAL
  Filled 2023-04-24: qty 1

## 2023-04-24 MED ORDER — APIXABAN 5 MG PO TABS: 5.0000 mg | ORAL_TABLET | Freq: Two times a day (BID) | ORAL | Status: DC

## 2023-04-24 MED ORDER — INSULIN DETEMIR 100 UNIT/ML ~~LOC~~ SOLN
10.0000 [IU] | Freq: Every day | SUBCUTANEOUS | Status: DC
  Administered 2023-04-25: 10 [IU] via SUBCUTANEOUS
  Filled 2023-04-24: qty 0.1

## 2023-04-24 MED ORDER — ACETAMINOPHEN 500 MG PO TABS
1000.0000 mg | ORAL_TABLET | Freq: Once | ORAL | Status: AC
  Administered 2023-04-24: 1000 mg via ORAL
  Filled 2023-04-24: qty 2

## 2023-04-24 MED ORDER — ONDANSETRON HCL 4 MG PO TABS
4.0000 mg | ORAL_TABLET | Freq: Four times a day (QID) | ORAL | Status: DC | PRN
Start: 2023-04-24 — End: 2023-04-25

## 2023-04-24 MED ORDER — LACTATED RINGERS IV SOLN: INTRAVENOUS | Status: DC

## 2023-04-24 MED ORDER — LOSARTAN POTASSIUM 50 MG PO TABS
50.0000 mg | ORAL_TABLET | Freq: Every day | ORAL | Status: DC
Start: 2023-04-25 — End: 2023-04-25

## 2023-04-24 MED ORDER — METHOCARBAMOL 1000 MG/10ML IJ SOLN: 500.0000 mg | Freq: Four times a day (QID) | INTRAMUSCULAR | Status: DC | PRN

## 2023-04-24 MED ORDER — METOPROLOL SUCCINATE ER 50 MG PO TB24
100.0000 mg | ORAL_TABLET | Freq: Every day | ORAL | Status: DC
  Administered 2023-04-25: 100 mg via ORAL
  Filled 2023-04-24: qty 2

## 2023-04-24 MED ORDER — PHENOL 1.4 % MT LIQD: 1.0000 | OROMUCOSAL | Status: DC | PRN

## 2023-04-24 MED ORDER — LACTATED RINGERS IV SOLN: INTRAVENOUS | Status: AC

## 2023-04-24 MED ORDER — MENTHOL 3 MG MT LOZG: 1.0000 | LOZENGE | OROMUCOSAL | Status: DC | PRN

## 2023-04-24 MED ORDER — INSULIN LISPRO (1 UNIT DIAL) 100 UNIT/ML (KWIKPEN): 5.0000 [IU] | PEN_INJECTOR | Freq: Three times a day (TID) | SUBCUTANEOUS | Status: DC

## 2023-04-24 MED ORDER — KETOROLAC TROMETHAMINE 15 MG/ML IJ SOLN
7.5000 mg | Freq: Four times a day (QID) | INTRAMUSCULAR | Status: AC
  Administered 2023-04-24 – 2023-04-25 (×4): 7.5 mg via INTRAVENOUS
  Filled 2023-04-24 (×4): qty 1

## 2023-04-24 MED ORDER — ONDANSETRON HCL 4 MG/2ML IJ SOLN
INTRAMUSCULAR | Status: DC | PRN
  Administered 2023-04-24: 4 mg via INTRAVENOUS

## 2023-04-24 MED ORDER — METHOCARBAMOL 500 MG PO TABS: 500.0000 mg | ORAL_TABLET | Freq: Three times a day (TID) | ORAL | 0 refills | Status: AC | PRN

## 2023-04-24 MED ORDER — INSULIN ASPART 100 UNIT/ML IJ SOLN: 0.0000 [IU] | INTRAMUSCULAR | Status: DC | PRN

## 2023-04-24 MED ORDER — KETOROLAC TROMETHAMINE 15 MG/ML IJ SOLN: 15.0000 mg | Freq: Once | INTRAMUSCULAR | Status: DC

## 2023-04-24 MED ORDER — VENLAFAXINE HCL ER 37.5 MG PO CP24
37.5000 mg | ORAL_CAPSULE | Freq: Every day | ORAL | Status: DC
  Administered 2023-04-25: 37.5 mg via ORAL
  Filled 2023-04-24: qty 1

## 2023-04-24 MED ORDER — DEXAMETHASONE SODIUM PHOSPHATE 4 MG/ML IJ SOLN
INTRAMUSCULAR | Status: DC | PRN
  Administered 2023-04-24: 8 mg via INTRAVENOUS

## 2023-04-24 MED ORDER — CLONIDINE HCL (ANALGESIA) 100 MCG/ML EP SOLN
EPIDURAL | Status: DC | PRN
  Administered 2023-04-24: 100 ug

## 2023-04-24 MED ORDER — DIPHENHYDRAMINE HCL 12.5 MG/5ML PO ELIX: 12.5000 mg | ORAL_SOLUTION | ORAL | Status: DC | PRN

## 2023-04-24 MED ORDER — SODIUM CHLORIDE 0.9 % IV SOLN: INTRAVENOUS | Status: DC

## 2023-04-24 MED ORDER — 0.9 % SODIUM CHLORIDE (POUR BTL) OPTIME
TOPICAL | Status: DC | PRN
  Administered 2023-04-24: 1000 mL

## 2023-04-24 MED ORDER — BUPIVACAINE-EPINEPHRINE 0.25% -1:200000 IJ SOLN
INTRAMUSCULAR | Status: AC
  Filled 2023-04-24: qty 1

## 2023-04-24 MED ORDER — SODIUM CHLORIDE (PF) 0.9 % IJ SOLN
INTRAMUSCULAR | Status: DC | PRN
  Administered 2023-04-24: 80 mL

## 2023-04-24 MED ORDER — BUPIVACAINE IN DEXTROSE 0.75-8.25 % IT SOLN
INTRATHECAL | Status: DC | PRN
  Administered 2023-04-24: 1.4 mL via INTRATHECAL

## 2023-04-24 MED ORDER — WATER FOR IRRIGATION, STERILE IR SOLN
Status: DC | PRN
  Administered 2023-04-24: 1000 mL

## 2023-04-24 MED ORDER — MELATONIN 5 MG PO TABS: 10.0000 mg | ORAL_TABLET | Freq: Every evening | ORAL | Status: DC | PRN

## 2023-04-24 MED ORDER — ROPIVACAINE HCL 5 MG/ML IJ SOLN
INTRAMUSCULAR | Status: DC | PRN
  Administered 2023-04-24 (×6): 5 mL via PERINEURAL

## 2023-04-24 MED ORDER — FENTANYL CITRATE PF 50 MCG/ML IJ SOSY
50.0000 ug | PREFILLED_SYRINGE | INTRAMUSCULAR | Status: DC
  Filled 2023-04-24: qty 2

## 2023-04-24 MED ORDER — ISOPROPYL ALCOHOL 70 % SOLN
Status: DC | PRN
  Administered 2023-04-24: 1 via TOPICAL

## 2023-04-24 MED ORDER — ACETAMINOPHEN 10 MG/ML IV SOLN
1000.0000 mg | Freq: Once | INTRAVENOUS | Status: DC | PRN
Start: 2023-04-24 — End: 2023-04-24

## 2023-04-24 MED ORDER — BUPIVACAINE LIPOSOME 1.3 % IJ SUSP
INTRAMUSCULAR | Status: AC
  Filled 2023-04-24: qty 20

## 2023-04-24 MED ORDER — DEXAMETHASONE SODIUM PHOSPHATE 10 MG/ML IJ SOLN
8.0000 mg | Freq: Once | INTRAMUSCULAR | Status: AC
  Administered 2023-04-24: 4 mg via INTRAVENOUS

## 2023-04-24 MED ORDER — ORAL CARE MOUTH RINSE: 15.0000 mL | Freq: Once | OROMUCOSAL | Status: AC

## 2023-04-24 MED ORDER — ONDANSETRON HCL 4 MG PO TABS: 4.0000 mg | ORAL_TABLET | Freq: Three times a day (TID) | ORAL | 0 refills | Status: AC | PRN

## 2023-04-24 MED ORDER — FENTANYL CITRATE PF 50 MCG/ML IJ SOSY: 25.0000 ug | PREFILLED_SYRINGE | INTRAMUSCULAR | Status: DC | PRN

## 2023-04-24 MED ORDER — ONDANSETRON HCL 4 MG/2ML IJ SOLN: 4.0000 mg | Freq: Four times a day (QID) | INTRAMUSCULAR | Status: DC | PRN

## 2023-04-24 MED ORDER — MIDAZOLAM HCL 2 MG/2ML IJ SOLN
1.0000 mg | INTRAMUSCULAR | Status: AC
  Administered 2023-04-24: 2 mg via INTRAVENOUS
  Filled 2023-04-24: qty 2

## 2023-04-24 MED ORDER — ZOLPIDEM TARTRATE 5 MG PO TABS: 5.0000 mg | ORAL_TABLET | Freq: Every evening | ORAL | Status: DC | PRN

## 2023-04-24 MED ORDER — LACTATED RINGERS IV SOLN: INTRAVENOUS | Status: DC | PRN

## 2023-04-24 MED ORDER — BUPIVACAINE LIPOSOME 1.3 % IJ SUSP: 20.0000 mL | Freq: Once | INTRAMUSCULAR | Status: DC

## 2023-04-24 MED ORDER — SODIUM CHLORIDE 0.9 % IV SOLN: 12.5000 mg | INTRAVENOUS | Status: DC | PRN

## 2023-04-24 MED ORDER — SODIUM CHLORIDE 0.9 % IR SOLN
Status: DC | PRN
  Administered 2023-04-24: 1000 mL

## 2023-04-24 MED ORDER — MECLIZINE HCL 25 MG PO TABS: 25.0000 mg | ORAL_TABLET | Freq: Three times a day (TID) | ORAL | Status: DC | PRN

## 2023-04-24 MED ORDER — PROPOFOL 500 MG/50ML IV EMUL
INTRAVENOUS | Status: DC | PRN
  Administered 2023-04-24: 75 ug/kg/min via INTRAVENOUS

## 2023-04-24 MED ORDER — POLYETHYLENE GLYCOL 3350 17 G PO PACK: 17.0000 g | PACK | Freq: Every day | ORAL | Status: DC | PRN

## 2023-04-24 MED ORDER — ACETAMINOPHEN 500 MG PO TABS: 1000.0000 mg | ORAL_TABLET | Freq: Three times a day (TID) | ORAL | Status: AC | PRN

## 2023-04-24 MED ORDER — SACCHAROMYCES BOULARDII 250 MG PO CAPS
250.0000 mg | ORAL_CAPSULE | Freq: Every day | ORAL | Status: DC
  Administered 2023-04-25: 250 mg via ORAL
  Filled 2023-04-24: qty 1

## 2023-04-24 MED ORDER — HYDROMORPHONE HCL 1 MG/ML IJ SOLN: 0.5000 mg | INTRAMUSCULAR | Status: DC | PRN

## 2023-04-24 MED ORDER — CEFAZOLIN SODIUM-DEXTROSE 2-4 GM/100ML-% IV SOLN
2.0000 g | INTRAVENOUS | Status: AC
  Administered 2023-04-24: 2 g via INTRAVENOUS
  Filled 2023-04-24: qty 100

## 2023-04-24 MED ORDER — ACETAMINOPHEN 325 MG PO TABS: 325.0000 mg | ORAL_TABLET | Freq: Four times a day (QID) | ORAL | Status: DC | PRN

## 2023-04-24 MED ORDER — SODIUM CHLORIDE (PF) 0.9 % IJ SOLN
INTRAMUSCULAR | Status: AC
Start: 2023-04-24 — End: ?
  Filled 2023-04-24: qty 30

## 2023-04-24 MED ORDER — CHLORHEXIDINE GLUCONATE 0.12 % MT SOLN
15.0000 mL | Freq: Once | OROMUCOSAL | Status: AC
  Administered 2023-04-24: 15 mL via OROMUCOSAL

## 2023-04-24 SURGICAL SUPPLY — 54 items
BAG COUNTER SPONGE SURGICOUNT (BAG) IMPLANT
BLADE SAG 18X100X1.27 (BLADE) ×1 IMPLANT
BLADE SAW SAG 35X64 .89 (BLADE) ×1 IMPLANT
BNDG COHESIVE 3X5 TAN ST LF (GAUZE/BANDAGES/DRESSINGS) ×1 IMPLANT
BNDG ELASTIC 6X10 VLCR STRL LF (GAUZE/BANDAGES/DRESSINGS) ×1 IMPLANT
BOWL SMART MIX CTS (DISPOSABLE) ×1 IMPLANT
CEMENT BONE R 1X40 (Cement) IMPLANT
CEMENT BONE REFOBACIN R1X40 US (Cement) IMPLANT
CHLORAPREP W/TINT 26 (MISCELLANEOUS) ×2 IMPLANT
COVER SURGICAL LIGHT HANDLE (MISCELLANEOUS) ×1 IMPLANT
CUFF TRNQT CYL 34X4.125X (TOURNIQUET CUFF) ×1 IMPLANT
DERMABOND ADVANCED .7 DNX12 (GAUZE/BANDAGES/DRESSINGS) ×1 IMPLANT
DRAPE INCISE IOBAN 85X60 (DRAPES) ×1 IMPLANT
DRAPE SHEET LG 3/4 BI-LAMINATE (DRAPES) ×1 IMPLANT
DRAPE U-SHAPE 47X51 STRL (DRAPES) ×1 IMPLANT
DRSG AQUACEL AG ADV 3.5X10 (GAUZE/BANDAGES/DRESSINGS) ×1 IMPLANT
ELECT REM PT RETURN 15FT ADLT (MISCELLANEOUS) ×1 IMPLANT
FEMUR CMT CR STD SZ 6 RT KNEE (Joint) ×1 IMPLANT
FEMUR CMTD CR STD SZ 6 RT KNEE (Joint) IMPLANT
GAUZE SPONGE 4X4 12PLY STRL (GAUZE/BANDAGES/DRESSINGS) ×1 IMPLANT
GLOVE BIO SURGEON STRL SZ 6.5 (GLOVE) ×2 IMPLANT
GLOVE BIOGEL PI IND STRL 6.5 (GLOVE) ×1 IMPLANT
GLOVE BIOGEL PI IND STRL 8 (GLOVE) ×1 IMPLANT
GLOVE SURG ORTHO 8.0 STRL STRW (GLOVE) ×2 IMPLANT
GOWN STRL REUS W/ TWL XL LVL3 (GOWN DISPOSABLE) ×2 IMPLANT
HOLDER FOLEY CATH W/STRAP (MISCELLANEOUS) ×1 IMPLANT
HOOD PEEL AWAY T7 (MISCELLANEOUS) ×3 IMPLANT
INSERT TIB ASF PS CD/6-7 RT 13 (Insert) IMPLANT
KIT TURNOVER KIT A (KITS) IMPLANT
MANIFOLD NEPTUNE II (INSTRUMENTS) ×1 IMPLANT
MARKER SKIN DUAL TIP RULER LAB (MISCELLANEOUS) ×1 IMPLANT
NS IRRIG 1000ML POUR BTL (IV SOLUTION) ×1 IMPLANT
PACK TOTAL KNEE CUSTOM (KITS) ×1 IMPLANT
PIN DRILL HDLS TROCAR 75 4PK (PIN) IMPLANT
SCREW HEADED 33MM KNEE (MISCELLANEOUS) IMPLANT
SET HNDPC FAN SPRY TIP SCT (DISPOSABLE) ×1 IMPLANT
SOLUTION IRRIG SURGIPHOR (IV SOLUTION) IMPLANT
SPIKE FLUID TRANSFER (MISCELLANEOUS) ×1 IMPLANT
STEM POLY PAT PLY 32M KNEE (Knees) IMPLANT
STEM TIBIA 5 DEG SZ D R KNEE (Knees) IMPLANT
STRIP CLOSURE SKIN 1/2X4 (GAUZE/BANDAGES/DRESSINGS) ×1 IMPLANT
SUT MNCRL AB 3-0 PS2 18 (SUTURE) ×1 IMPLANT
SUT STRATAFIX 0 PDS 27 VIOLET (SUTURE) ×1
SUT STRATAFIX 14 PDO 48 VLT (SUTURE) ×1 IMPLANT
SUT STRATAFIX PDO 1 14 VIOLET (SUTURE) ×1
SUT VIC AB 0 CT1 36 (SUTURE) IMPLANT
SUT VIC AB 2-0 CT2 27 (SUTURE) ×2 IMPLANT
SUTURE STRATFX 0 PDS 27 VIOLET (SUTURE) ×1 IMPLANT
SYR 50ML LL SCALE MARK (SYRINGE) ×1 IMPLANT
TIBIA STEM 5 DEG SZ D R KNEE (Knees) ×1 IMPLANT
TRAY FOLEY MTR SLVR 14FR STAT (SET/KITS/TRAYS/PACK) IMPLANT
TUBE SUCTION HIGH CAP CLEAR NV (SUCTIONS) ×1 IMPLANT
UNDERPAD 30X36 HEAVY ABSORB (UNDERPADS AND DIAPERS) ×1 IMPLANT
WRAP KNEE MAXI GEL POST OP (GAUZE/BANDAGES/DRESSINGS) IMPLANT

## 2023-04-24 NOTE — Anesthesia Procedure Notes (Signed)
Spinal  Patient location during procedure: OR Start time: 04/24/2023 10:58 AM End time: 04/24/2023 11:01 AM Reason for block: surgical anesthesia Staffing Performed: anesthesiologist  Anesthesiologist: Leilani Able, MD Performed by: Leilani Able, MD Authorized by: Leilani Able, MD   Preanesthetic Checklist Completed: patient identified, IV checked, site marked, risks and benefits discussed, surgical consent, monitors and equipment checked, pre-op evaluation and timeout performed Spinal Block Patient position: sitting Prep: DuraPrep and site prepped and draped Patient monitoring: continuous pulse ox and blood pressure Approach: midline Location: L3-4 Injection technique: single-shot Needle Needle type: Pencan  Needle gauge: 24 G Needle length: 10 cm Needle insertion depth: 7 cm Assessment Sensory level: T8 Events: CSF return

## 2023-04-24 NOTE — Discharge Instructions (Signed)
INSTRUCTIONS AFTER JOINT REPLACEMENT   Remove items at home which could result in a fall. This includes throw rugs or furniture in walking pathways ICE to the affected joint every three hours while awake for 30 minutes at a time, for at least the first 3-5 days, and then as needed for pain and swelling.  Continue to use ice for pain and swelling. You may notice swelling that will progress down to the foot and ankle.  This is normal after surgery.  Elevate your leg when you are not up walking on it.   Continue to use the breathing machine you got in the hospital (incentive spirometer) which will help keep your temperature down.  It is common for your temperature to cycle up and down following surgery, especially at night when you are not up moving around and exerting yourself.  The breathing machine keeps your lungs expanded and your temperature down.  DIET:  As you were doing prior to hospitalization, we recommend a well-balanced diet.  DRESSING / WOUND CARE / SHOWERING:  Keep the surgical dressing until follow up.  The dressing is water proof, so you can shower without any extra covering.  IF THE DRESSING FALLS OFF or the wound gets wet inside, change the dressing with sterile gauze.  Please use good hand washing techniques before changing the dressing.  Do not use any lotions or creams on the incision until instructed by your surgeon.    ACTIVITY  Increase activity slowly as tolerated, but follow the weight bearing instructions below.   No driving for 6 weeks or until further direction given by your physician.  You cannot drive while taking narcotics.  No lifting or carrying greater than 10 lbs. until further directed by your surgeon. Avoid periods of inactivity such as sitting longer than an hour when not asleep. This helps prevent blood clots.  You may return to work once you are authorized by your doctor.   WEIGHT BEARING: Weight bearing as tolerated with assist device (walker, cane, etc) as  directed, use it as long as suggested by your surgeon or therapist, typically at least 4-6 weeks.  EXERCISES  Results after joint replacement surgery are often greatly improved when you follow the exercise, range of motion and muscle strengthening exercises prescribed by your doctor. Safety measures are also important to protect the joint from further injury. Any time any of these exercises cause you to have increased pain or swelling, decrease what you are doing until you are comfortable again and then slowly increase them. If you have problems or questions, call your caregiver or physical therapist for advice.   Rehabilitation is important following a joint replacement. After just a few days of immobilization, the muscles of the leg can become weakened and shrink (atrophy).  These exercises are designed to build up the tone and strength of the thigh and leg muscles and to improve motion. Often times heat used for twenty to thirty minutes before working out will loosen up your tissues and help with improving the range of motion but do not use heat for the first two weeks following surgery (sometimes heat can increase post-operative swelling).   These exercises can be done on a training (exercise) mat, on the floor, on a table or on a bed. Use whatever works the best and is most comfortable for you.    Use music or television while you are exercising so that the exercises are a pleasant break in your day. This will make your life  better with the exercises acting as a break in your routine that you can look forward to.   Perform all exercises about fifteen times, three times per day or as directed.  You should exercise both the operative leg and the other leg as well.  Exercises include:   Quad Sets - Tighten up the muscle on the front of the thigh (Quad) and hold for 5-10 seconds.   Straight Leg Raises - With your knee straight (if you were given a brace, keep it on), lift the leg to 60 degrees, hold  for 3 seconds, and slowly lower the leg.  Perform this exercise against resistance later as your leg gets stronger.  Leg Slides: Lying on your back, slowly slide your foot toward your buttocks, bending your knee up off the floor (only go as far as is comfortable). Then slowly slide your foot back down until your leg is flat on the floor again.  Angel Wings: Lying on your back spread your legs to the side as far apart as you can without causing discomfort.  Hamstring Strength:  Lying on your back, push your heel against the floor with your leg straight by tightening up the muscles of your buttocks.  Repeat, but this time bend your knee to a comfortable angle, and push your heel against the floor.  You may put a pillow under the heel to make it more comfortable if necessary.   A rehabilitation program following joint replacement surgery can speed recovery and prevent re-injury in the future due to weakened muscles. Contact your doctor or a physical therapist for more information on knee rehabilitation.   CONSTIPATION:  Constipation is defined medically as fewer than three stools per week and severe constipation as less than one stool per week.  Even if you have a regular bowel pattern at home, your normal regimen is likely to be disrupted due to multiple reasons following surgery.  Combination of anesthesia, postoperative narcotics, change in appetite and fluid intake all can affect your bowels.   YOU MUST use at least one of the following options; they are listed in order of increasing strength to get the job done.  They are all available over the counter, and you may need to use some, POSSIBLY even all of these options:    Drink plenty of fluids (prune juice may be helpful) and high fiber foods Colace 100 mg by mouth twice a day  Senokot for constipation as directed and as needed Dulcolax (bisacodyl), take with full glass of water  Miralax (polyethylene glycol) once or twice a day as needed.  If you  have tried all these things and are unable to have a bowel movement in the first 3-4 days after surgery call either your surgeon or your primary doctor.    If you experience loose stools or diarrhea, hold the medications until you stool forms back up.  If your symptoms do not get better within 1 week or if they get worse, check with your doctor.  If you experience "the worst abdominal pain ever" or develop nausea or vomiting, please contact the office immediately for further recommendations for treatment.  ITCHING:  If you experience itching with your medications, try taking only a single pain pill, or even half a pain pill at a time.  You can also use Benadryl over the counter for itching or also to help with sleep.   TED HOSE STOCKINGS:  Use stockings on both legs until for at least 2 weeks or  as directed by physician office. They may be removed at night for sleeping.  MEDICATIONS:  See your medication summary on the "After Visit Summary" that nursing will review with you.  You may have some home medications which will be placed on hold until you complete the course of blood thinner medication.  It is important for you to complete the blood thinner medication as prescribed.  Blood clot prevention (DVT Prophylaxis): After surgery you are at an increased risk for a blood clot. You are to resume your Eliquis after surgery to help reduce your risk of getting a blood clot.  For the first two days after surgery, take a half a dose (2.5 mg Eliquis) twice a day.  Then on the third day, resume your full dose (5 mg Eliquis) twice a day.  Take your Eliquis for a minimum of 4 weeks from a post-operative standpoint.  Then follow the direction of your regular prescribing provider.  Signs of a pulmonary embolus (blood clot in the lungs) include sudden short of breath, feeling lightheaded or dizzy, chest pain with a deep breath, rapid pulse rapid breathing.  Signs of a blood clot in your arms or legs include new  unexplained swelling and cramping, warm, red or darkened skin around the painful area.  Please call the office or 911 right away if these signs or symptoms develop.  PRECAUTIONS:   If you experience chest pain or shortness of breath - call 911 immediately for transfer to the hospital emergency department.   If you develop a fever greater that 101 F, purulent drainage from wound, increased redness or drainage from wound, foul odor from the wound/dressing, or calf pain - CONTACT YOUR SURGEON.                                                   FOLLOW-UP APPOINTMENTS:  If you do not already have a post-op appointment, please call the office for an appointment to be seen by your surgeon.  Guidelines for how soon to be seen are listed in your "After Visit Summary", but are typically between 2-3 weeks after surgery.  If you have a specialized bandage, you may be told to follow up 1 week after surgery.  OTHER INSTRUCTIONS:  Knee Replacement:  Do not place pillow under knee, focus on keeping the knee straight while resting.  Place foam block, curve side up under heel at all times except when walking.  DO NOT modify, tear, cut, or change the foam block in any way.  POST-OPERATIVE OPIOID TAPER INSTRUCTIONS: It is important to wean off of your opioid medication as soon as possible. If you do not need pain medication after your surgery it is ok to stop day one. Opioids include: Codeine, Hydrocodone(Norco, Vicodin), Oxycodone(Percocet, oxycontin) and hydromorphone amongst others.  Long term and even short term use of opiods can cause: Increased pain response Dependence Constipation Depression Respiratory depression And more.  Withdrawal symptoms can include Flu like symptoms Nausea, vomiting And more Techniques to manage these symptoms Hydrate well Eat regular healthy meals Stay active Use relaxation techniques(deep breathing, meditating, yoga) Do Not substitute Alcohol to help with tapering If you  have been on opioids for less than two weeks and do not have pain than it is ok to stop all together.  Plan to wean off of opioids This plan should  start within one week post op of your joint replacement. Maintain the same interval or time between taking each dose and first decrease the dose.  Cut the total daily intake of opioids by one tablet each day Next start to increase the time between doses. The last dose that should be eliminated is the evening dose.   MAKE SURE YOU:  Understand these instructions.  Get help right away if you are not doing well or get worse.    Thank you for letting us be a part of your medical care team.  It is a privilege we respect greatly.  We hope these instructions will help you stay on track for a fast and full recovery!

## 2023-04-24 NOTE — Anesthesia Procedure Notes (Signed)
Anesthesia Regional Block: Adductor canal block   Pre-Anesthetic Checklist: , timeout performed,  Correct Patient, Correct Site, Correct Laterality,  Correct Procedure, Correct Position, site marked,  Risks and benefits discussed,  Surgical consent,  Pre-op evaluation,  At surgeon's request and post-op pain management  Laterality: Lower and Right  Prep: chloraprep       Needles:  Injection technique: Single-shot  Needle Type: Echogenic Stimulator Needle     Needle Length: 9cm  Needle Gauge: 20   Needle insertion depth: 3 cm   Additional Needles:   Procedures:,,,, ultrasound used (permanent image in chart),,    Narrative:  Start time: 04/24/2023 10:20 AM End time: 04/24/2023 10:30 AM Injection made incrementally with aspirations every 5 mL.  Performed by: Personally  Anesthesiologist: Leilani Able, MD

## 2023-04-24 NOTE — Evaluation (Signed)
Physical Therapy Evaluation Patient Details Name: Meghan Alexander MRN: [id number] DOB: 12-31-1955 Today's Date: 04/24/2023  History of Present Illness  67 yo female presents to therapy s/p R TKA on 04/24/2023 due to failure of conservative measures. Pt PMH  includes but is not limited to: chronic diarrhea, ILD, A-fib, R ankle fx s/p ORIF,  HTN, DOE, DM II, non Hodgkin's lymphoma, GERD, Crohn's dz, and cervical and thorasic surgery.  Clinical Impression      Meghan Alexander is a 67 y.o. female POD 0 s/p R TKA. Patient reports required some assist with mobility and ADLs at baseline at rollator level. Patient is now limited by functional impairments (see PT problem list below) and requires CGA and increased time with use of hospital bed for bed mobility and mod A for transfers and unable to safely ambulate at time of eval due to R LE instability attributed to slow regression of anesthesia. Patient instructed in exercise to facilitate ROM and circulation to manage edema ankle pumps, AA for SLR and unable to complete SAQ.  Patient will benefit from continued skilled PT interventions to address impairments and progress towards PLOF. Acute PT will follow to progress mobility and stair training in preparation for safe discharge home with family support and OPPT services.     If plan is discharge home, recommend the following: A lot of help with walking and/or transfers;A lot of help with bathing/dressing/bathroom;Assistance with cooking/housework;Help with stairs or ramp for entrance;Assist for transportation   Can travel by private vehicle        Equipment Recommendations Rolling walker (2 wheels)  Recommendations for Other Services       Functional Status Assessment Patient has had a recent decline in their functional status and demonstrates the ability to make significant improvements in function in a reasonable and predictable amount of time.     Precautions / Restrictions  Precautions Precautions: Knee;Fall Restrictions Weight Bearing Restrictions Per Provider Order: No      Mobility  Bed Mobility Overal bed mobility: Needs Assistance Bed Mobility: Supine to Sit     Supine to sit: Supervision, HOB elevated, Used rails     General bed mobility comments: increased time and cues    Transfers Overall transfer level: Needs assistance Equipment used: Rolling walker (2 wheels) Transfers: Sit to/from Stand, Bed to chair/wheelchair/BSC Sit to Stand: Min assist Stand pivot transfers: Mod assist         General transfer comment: R LE instability limiting safety and IND with transfer tasks with heavy reliance on B UE support to complete SPT with mod A and cues. R LE instability attributed to slow regression of anesthesia    Ambulation/Gait               General Gait Details: NT due to R LE instability  Stairs            Wheelchair Mobility     Tilt Bed    Modified Rankin (Stroke Patients Only)       Balance Overall balance assessment: History of Falls, Needs assistance Sitting-balance support: Feet supported Sitting balance-[name]: Good     Standing balance support: Bilateral upper extremity supported, During functional activity, Reliant on assistive device for balance Standing balance-[name]: Poor Standing balance comment: CGA for static standing with B UE support                             Pertinent Vitals/Pain Pain Assessment Pain  Assessment: 0-10 Pain Score: 4  Pain Location: R knee, R ankle and chronic back pain Pain Descriptors / Indicators: Aching, Constant, Discomfort, Dull, Grimacing, Operative site guarding Pain Intervention(s): Limited activity within patient's tolerance, Monitored during session, Premedicated before session, Repositioned, Ice applied    Home Living Family/patient expects to be discharged to:: Private residence Living Arrangements: Spouse/significant other Available  Help at Discharge: Family;Available PRN/intermittently Type of Home: House Home Access: Stairs to enter Entrance Stairs-Rails: None Entrance Stairs-Number of Steps: 1 Alternate Level Stairs-Number of Steps: chair lift Home Layout: Two level;Bed/bath upstairs Home Equipment: Shower seat;Grab bars - tub/shower;Grab bars - toilet;Rollator (4 wheels);Other (comment) (chair lift) Additional Comments: reports no longer has RW and bed rail    Prior Function Prior Level of Function : Needs assist       Physical Assist : Mobility (physical);ADLs (physical) Mobility (physical): Stairs ADLs (physical): IADLs;Bathing;Dressing (lower body dressing) Mobility Comments: rollator for household mobilty and very limited community navigation, pt requires A for ADLs, self care tasks ADLs Comments: pt reports having a procedure for each mobility task     Extremity/Trunk Assessment        Lower Extremity Assessment Lower Extremity Assessment: RLE deficits/detail RLE Deficits / Details: ankle DF limited due to ankle Surgery ROM and MMT 3+/5, PF 4/5; AA for SLR, absent ability to perform SAQ and instability with R LE in standing RLE Sensation: WNL    Cervical / Trunk Assessment Cervical / Trunk Assessment: Neck Surgery;Back Surgery  Communication   Communication Communication: No apparent difficulties  Cognition Arousal: Alert Behavior During Therapy: WFL for tasks assessed/performed Overall Cognitive Status: Within Functional Limits for tasks assessed                                          General Comments      Exercises Total Joint Exercises Ankle Circles/Pumps: AROM, Both, 10 reps   Assessment/Plan    PT Assessment Patient needs continued PT services  PT Problem List Decreased strength;Decreased range of motion;Decreased activity tolerance;Decreased balance;Decreased mobility;Pain       PT Treatment Interventions DME instruction;Gait training;Functional mobility  training;Stair training;Therapeutic activities;Therapeutic exercise;Balance training;Neuromuscular re-education;Patient/family education;Modalities    PT Goals (Current goals can be found in the Care Plan section)  Acute Rehab PT Goals Patient Stated Goal: to get back to doing everything before ca PT Goal Formulation: With patient Time For Goal Achievement: 05/08/23 Potential to Achieve Goals: Good    Frequency 7X/week     Co-evaluation               AM-PAC PT "6 Clicks" Mobility  Outcome Measure Help needed turning from your back to your side while in a flat bed without using bedrails?: A Little Help needed moving from lying on your back to sitting on the side of a flat bed without using bedrails?: A Little Help needed moving to and from a bed to a chair (including a wheelchair)?: A Little Help needed standing up from a chair using your arms (e.g., wheelchair or bedside chair)?: A Lot Help needed to walk in hospital room?: Total Help needed climbing 3-5 steps with a railing? : Total 6 Click Score: 13    End of Session Equipment Utilized During Treatment: Gait belt Activity Tolerance: Treatment limited secondary to medical complications (Comment) (slow regression of anesthesia resulting in R  LE instabiltiy)     PT Visit  Diagnosis: Unsteadiness on feet (R26.81);Other abnormalities of gait and mobility (R26.89);Muscle weakness (generalized) (M62.81);Difficulty in walking, not elsewhere classified (R26.2);Pain;History of falling (Z91.81) Pain - Right/Left: Right Pain - part of body: Knee;Leg;Ankle and joints of foot (back)    Time: 0454-0981 PT Time Calculation (min) (ACUTE ONLY): 33 min   Charges:   PT Evaluation $PT Eval Low Complexity: 1 Low PT Treatments $Therapeutic Activity: 8-22 mins PT General Charges $$ ACUTE PT VISIT: 1 Visit         Johnny Bridge, PT Acute Rehab   Jacqualyn Posey 04/24/2023, 6:54 PM

## 2023-04-24 NOTE — Interval H&P Note (Signed)

## 2023-04-24 NOTE — Transfer of Care (Signed)
Immediate Anesthesia Transfer of Care Note  Patient: Meghan Alexander  Procedure(s) Performed: TOTAL KNEE ARTHROPLASTY (Right: Knee)  Patient Location: PACU  Anesthesia Type:MAC combined with regional for post-op pain  Level of Consciousness: awake and alert   Airway & Oxygen Therapy: Patient Spontanous Breathing and Patient connected to face mask oxygen  Post-op Assessment: Report given to RN and Post -op Vital signs reviewed and stable  Post vital signs: Reviewed and stable  Last Vitals:  Vitals Value Taken Time  BP 146/84 04/24/23 1323  Temp    Pulse 79 04/24/23 1326  Resp 13 04/24/23 1326  SpO2 97 % 04/24/23 1326  Vitals shown include unfiled device data.  Last Pain:  Vitals:   04/24/23 0917  TempSrc:   PainSc: 0-No pain      Patients Stated Pain Goal: 4 (04/24/23 0917)  Complications: No notable events documented.

## 2023-04-24 NOTE — Op Note (Signed)
DATE OF SURGERY:  04/24/2023 TIME: 1:07 PM  PATIENT NAME:  Meghan Alexander   AGE: 67 y.o.    PRE-OPERATIVE DIAGNOSIS: End-stage right knee osteoarthritis  POST-OPERATIVE DIAGNOSIS:  Same  PROCEDURE: Right total Knee Arthroplasty  SURGEON:  Shearon Clonch A Miki Labuda, MD   ASSISTANT: Kathie Dike, PA-C, present and scrubbed throughout the case, critical for assistance with exposure, retraction, instrumentation, and closure.   OPERATIVE IMPLANTS:  Cemented Zimmer persona size 6 standard CR right femur, right size D tibial baseplate, 32 mm all poly patella, 13 mm MC poly Implant Name Type Inv. Item Serial No. Manufacturer Lot No. LRB No. Used Action  CEMENT BONE R 1X40 - [id number] Cement CEMENT BONE R 1X40  [employer](ORTH,TRAU,BIO,SG) [id number] Right 1 Implanted  CEMENT BONE R 1X40 - [id number] Cement CEMENT BONE R 1X40  [employer](ORTH,TRAU,BIO,SG) [id number] Right 1 Implanted  STEM POLY PAT PLY 47M KNEE - [id number] Knees STEM POLY PAT PLY 47M KNEE  [employer](ORTH,TRAU,BIO,SG) [id number] Right 1 Implanted  FEMUR CMT CR STD SZ 6 RT KNEE - [id number] Joint FEMUR CMT CR STD SZ 6 RT KNEE  [employer]ON(ORTH,TRAU,BIO,SG) [id number] Right 1 Implanted  TIBIA STEM 5 DEG SZ D R KNEE - [id number] Knees TIBIA STEM 5 DEG SZ D R KNEE  [employer](ORTH,TRAU,BIO,SG) [id number] Right 1 Implanted  INSERT TIB ASF PS CD/6-7 RT 13 - [id number] Insert INSERT TIB ASF PS CD/6-7 RT 13  [employer](ORTH,TRAU,BIO,SG) [id number] Right 1 Implanted      PREOPERATIVE INDICATIONS:  [name] is a 67 y.o. year old female with end stage bone on bone degenerative arthritis of the knee who failed conservative treatment, including injections, antiinflammatories, activity modification, and assistive devices, and had significant impairment of their activities of daily living, and elected for Total Knee Arthroplasty.   The risks, benefits, and alternatives were discussed at length including but not limited to the  risks of infection, bleeding, nerve injury, stiffness, blood clots, the need for revision surgery, cardiopulmonary complications, among others, and they were willing to proceed.  ESTIMATED BLOOD LOSS: 50cc  OPERATIVE DESCRIPTION:   Once adequate anesthesia was induced, preoperative antibiotics, 2 gm of ancef,1 gm of Tranexamic Acid, and 8 mg of Decadron administered, the patient was positioned supine with a right thigh tourniquet placed.  The right lower extremity was prepped and draped in sterile fashion.  A time-  out was performed identifying the patient, planned procedure, and the appropriate extremity.     The leg was  exsanguinated, tourniquet elevated to 250 mmHg.  A midline incision was  made followed by median parapatellar arthrotomy. Anterior horn of the medial meniscus was released and resected. A medial release was performed, the infrapatellar fat pad was resected with care taken to protect the patellar tendon. The suprapatellar fat was removed to exposed the distal anterior femur. The anterior horn of the lateral meniscus and ACL were released.    Following initial  exposure, I first started with the femur  The femoral  canal was opened with a drill, canal was suctioned to try to prevent fat emboli.  An  intramedullary rod was passed set at 6 degrees valgus, 10 mm. The distal femur was resected.  Following this resection, the tibia was  subluxated anteriorly.  Using the extramedullary guide, 10 mm of bone was resected off   the proximal lateral tibia.  We confirmed the gap would be  stable medially and laterally with a size 10mm spacer block as well as confirmed that the tibial cut was  perpendicular in the coronal plane, checking with an alignment rod.    Once this was done, the posterior femoral referencing femoral sizer was placed under to the posterior condyles with 3 degrees of external rotational which was parallel to the transepicondylar axis and perpendicular to [employer]. The  femur was sized to be a size 6 in the anterior-  posterior dimension. The  anterior, posterior, and  chamfer cuts were made without difficulty nor   notching making certain that I was along the anterior cortex to help  with flexion gap stability. Next a laminar spreader was placed with the knee in flexion and the medial lateral menisci were resected.  5 cc of the Exparel mixture was injected in the medial side of the back of the knee and 3 cc in the lateral side.  1/2 inch curved osteotome was used to resect posterior osteophyte that was then removed with a pituitary rongeur.       At this point, the tibia was sized to be a size D.  The size D tray was  then pinned in position. Trial reduction was now carried with a 6 femur, D tibia, a 10 mm MC insert.  There was some hyperextension and laxity in flexion so upsized the liner to a 13 mm insert.  The knee had full extension and was stable to varus valgus stress in extension.  The knee was stable in flexion and the PCL was left intact  Attention was next directed to the patella.  Precut  measurement was noted to be 23 mm.  I resected down to 13 mm and used a  32mm patellar button to restore patellar height as well as cover the cut surface.     The patella lug holes were drilled and a 32 mm patella poly trial was placed.    The knee was brought to full extension with good flexion stability with the patella tracking through the trochlea without application of pressure.     Next the femoral component was again assessed and determined to be seated and appropriately lateralized.  The femoral lug holes were drilled.  The femoral component was then removed. Tibial component was again assessed and felt to be seated and appropriately rotated with the medial third of the tubercle. The tibia was then drilled, and keel punched.     Final components were  opened and cement was mixed.      Final implants were then  cemented onto cleaned and dried cut surfaces of  bone with the knee brought to extension with a 13mm MC poly.  The knee was irrigated with sterile Betadine diluted in saline as well as pulse lavage normal saline.  The synovial lining was  then injected a dilute Exparel with 30cc of 0.25% marcaine with epinephrine.         Once the cement had fully cured, excess cement was removed throughout the knee.  I confirmed that I was satisfied with the range of motion and stability, and the final 13 mm MC poly insert was chosen.  It was placed into the knee.         The tourniquet had been let down at 65 minutes.  No significant hemostasis was required.  The medial parapatellar arthrotomy was then reapproximated using #1 Vicryl and #1 Stratafix sutures with the knee  in flexion.  The remaining wound was closed with 0 stratafix, 2-0 Vicryl, and running 3-0 Monocryl. The knee was cleaned, dried, dressed sterilely using Dermabond and  Aquacel dressing.  The patient was then brought to recovery room in stable condition, tolerating the procedure  well. There were no complications.   Post op recs: WB: WBAT Abx: ancef Imaging: PACU xrays DVT prophylaxis: Post 2.5 mg twice daily postop day 1 and 2, resume Eliquis 5 mg postop day 3 Follow up: 2 weeks after surgery for a wound check with Dr. Blanchie Dessert at [facility].  Address: [address], [place], [place] [address]  Office Phone: [phone]  Weber Cooks, MD Orthopaedic Surgery

## 2023-04-24 NOTE — Anesthesia Postprocedure Evaluation (Signed)
Anesthesia Post Note  Patient: Floe Pruette  Procedure(s) Performed: TOTAL KNEE ARTHROPLASTY (Right: Knee)     Patient location during evaluation: PACU Anesthesia Type: Spinal Level of consciousness: awake Pain management: pain level controlled Vital Signs Assessment: post-procedure vital signs reviewed and stable Respiratory status: spontaneous breathing Cardiovascular status: blood pressure returned to baseline Postop Assessment: no headache, no backache, spinal receding, patient able to bend at knees and no apparent nausea or vomiting Anesthetic complications: no  No notable events documented.  Last Vitals:  Vitals:   04/24/23 1500 04/24/23 1515  BP: (!) 149/94 (!) 144/87  Pulse: 87 88  Resp: 14 (!) 21  Temp:  37 C  SpO2: 94% 97%    Last Pain:  Vitals:   04/24/23 1515  TempSrc:   PainSc: 0-No pain                 Caren Macadam

## 2023-04-24 NOTE — Plan of Care (Signed)
  Problem: Education: Goal: Knowledge of General Education information will improve Description: Including pain rating scale, medication(s)/side effects and non-pharmacologic comfort measures Outcome: Progressing   Problem: Activity: Goal: Risk for activity intolerance will decrease Outcome: Progressing   Problem: Nutrition: Goal: Adequate nutrition will be maintained Outcome: Progressing   Problem: Elimination: Goal: Will not experience complications related to bowel motility Outcome: Progressing   Problem: Pain Management: Goal: General experience of comfort will improve Outcome: Progressing   Problem: Safety: Goal: Ability to remain free from injury will improve Outcome: Progressing

## 2023-04-24 NOTE — Progress Notes (Signed)
Orthopedic Tech Progress Note Patient Details:  Meghan Alexander [birth year] [id number]  Ortho Devices Type of Ortho Device: Bone foam zero knee Ortho Device/Splint Interventions: Ordered      [place] 04/24/2023, 12:20 PM

## 2023-04-25 DIAGNOSIS — M1711 Unilateral primary osteoarthritis, right knee: Secondary | ICD-10-CM | POA: Diagnosis not present

## 2023-04-25 LAB — BASIC METABOLIC PANEL
Anion gap: 11 (ref 5–15)
BUN: 28 mg/dL — ABNORMAL HIGH (ref 8–23)
CO2: 21 mmol/L — ABNORMAL LOW (ref 22–32)
Calcium: 8.4 mg/dL — ABNORMAL LOW (ref 8.9–10.3)
Chloride: 99 mmol/L (ref 98–111)
Creatinine, Ser: 0.97 mg/dL (ref 0.44–1.00)
GFR, Estimated: >60 mL/min (ref >60)
Glucose, Bld: 197 mg/dL — ABNORMAL HIGH (ref 70–99)
Potassium: 3.8 mmol/L (ref 3.5–5.1)
Sodium: 131 mmol/L — ABNORMAL LOW (ref 135–145)

## 2023-04-25 LAB — CBC
HCT: 32.4 % — ABNORMAL LOW (ref 36.0–46.0)
Hemoglobin: 11 g/dL — ABNORMAL LOW (ref 12.0–15.0)
MCH: 29.5 pg (ref 26.0–34.0)
MCHC: 34 g/dL (ref 30.0–36.0)
MCV: 86.9 fL (ref 80.0–100.0)
Platelets: 179 10*3/uL (ref 150–400)
RBC: 3.73 MIL/uL — ABNORMAL LOW (ref 3.87–5.11)
RDW: 13.3 % (ref 11.5–15.5)
WBC: 17.6 10*3/uL — ABNORMAL HIGH (ref 4.0–10.5)
nRBC: 0 % (ref 0.0–0.2)

## 2023-04-25 LAB — GLUCOSE, CAPILLARY
Glucose-Capillary: 156 mg/dL — ABNORMAL HIGH (ref 70–99)
Glucose-Capillary: 193 mg/dL — ABNORMAL HIGH (ref 70–99)

## 2023-04-25 MED ORDER — ZOLPIDEM TARTRATE 5 MG PO TABS: 5.0000 mg | ORAL_TABLET | Freq: Every evening | ORAL | Status: DC | PRN

## 2023-04-25 MED ORDER — APIXABAN 5 MG PO TABS: 5.0000 mg | ORAL_TABLET | Freq: Two times a day (BID) | ORAL | Status: DC

## 2023-04-25 MED ORDER — APIXABAN 2.5 MG PO TABS
2.5000 mg | ORAL_TABLET | Freq: Two times a day (BID) | ORAL | Status: DC
  Administered 2023-04-25: 2.5 mg via ORAL
  Filled 2023-04-25: qty 1

## 2023-04-25 NOTE — Care Management Obs Status (Signed)
 MEDICARE OBSERVATION STATUS NOTIFICATION   Patient Details  Name: Meghan Alexander MRN: [id number] Date of Birth: February 03, 1956   Medicare Observation Status Notification Given:  Yes    Ewing Schlein, LCSW 04/25/2023, 9:45 AM

## 2023-04-25 NOTE — Progress Notes (Signed)
Patient discharged to home w/ husband. Given all belongings, instructions, equipment. Verbalized understanding of instructions. Escorted to pov via w/c. 

## 2023-04-25 NOTE — TOC Transition Note (Signed)
 Transition of Care [facility]) - Discharge Note  Patient Details  Name: Meghan Alexander MRN: [id number] Date of Birth: Dec 08, 1955  Transition of Care [facility]) CM/SW Contact:  Duwaine GORMAN Aran, LCSW Phone Number: 04/25/2023, 9:50 AM  Clinical Narrative: Patient is expected to discharge home after working with PT. CSW met with patient to review discharge plan and needs. Patient reported she will go home with OPPT. Patient will need a rolling walker and has not received one through insurance in the past 5 years. [employer] delivered rolling walker to room. TOC signing off.   Final next level of care: OP Rehab Barriers to Discharge: No Barriers Identified  Patient Goals and CMS Choice CMS Medicare.gov Compare Post Acute Care list provided to:: Patient Choice offered to / list presented to : Patient  Discharge Plan and Services Additional resources added to the After Visit Summary for         DME Arranged: Walker rolling DME Agency: [employer] Date DME Agency Contacted: 04/25/23 Representative spoke with at DME Agency: [facility]  Social Drivers of Health (SDOH) Interventions SDOH Screenings   Food Insecurity: No Food Insecurity (04/24/2023)  Housing: Unknown (04/24/2023)  Transportation Needs: No Transportation Needs (04/24/2023)  Utilities: Not At Risk (04/24/2023)  Social Connections: Socially Isolated (04/24/2023)  Tobacco Use: Low Risk  (04/24/2023)   Readmission Risk Interventions    10/06/2022    3:24 PM 07/31/2022   11:35 AM 10/11/2021    1:21 PM  Readmission Risk Prevention Plan  Transportation Screening Complete Complete   PCP or Specialist Appt within 3-5 Days Complete Complete Complete  HRI or Home Care Consult Complete Complete Complete  Social Work Consult for Recovery Care Planning/Counseling Complete Complete   Palliative Care Screening Not Applicable Not Applicable   Medication Review Oceanographer) Complete Complete Complete

## 2023-04-25 NOTE — Progress Notes (Signed)
 Physical Therapy Treatment Patient Details Name: Meghan Alexander MRN: [id number] DOB: 1955/08/24 Today's Date: 04/25/2023   History of Present Illness 67 yo female presents to therapy s/p R TKA on 04/24/2023 due to failure of conservative measures. Pt PMH  includes but is not limited to: chronic diarrhea, ILD, A-fib, R ankle fx s/p ORIF,  HTN, DOE, DM II, non Hodgkin's lymphoma, GERD, Crohn's dz, and cervical and thorasic surgery.    PT Comments  Pt progressing however still with some intermittent R knee instability/buckling-likely d/t residual effects of nerve block. Will see again this pm and pt will likely be ready to d/c home this pm.   If plan is discharge home, recommend the following: A lot of help with walking and/or transfers;A lot of help with bathing/dressing/bathroom;Assistance with cooking/housework;Help with stairs or ramp for entrance;Assist for transportation   Can travel by private vehicle        Equipment Recommendations  Rolling walker (2 wheels)    Recommendations for Other Services       Precautions / Restrictions Precautions Precautions: Knee;Fall Restrictions Weight Bearing Restrictions Per Provider Order: No     Mobility  Bed Mobility Overal bed mobility: Needs Assistance Bed Mobility: Supine to Sit     Supine to sit: Supervision, HOB elevated, Used rails     General bed mobility comments: increased time and cues    Transfers Overall transfer level: Needs assistance Equipment used: Rolling walker (2 wheels) Transfers: Sit to/from Stand Sit to Stand: Contact guard assist           General transfer comment: cues for hand placement and safety    Ambulation/Gait Ambulation/Gait assistance: Min assist, Contact guard assist Gait Distance (Feet): 120 Feet Assistive device: Rolling walker (2 wheels) Gait Pattern/deviations: Step-to pattern, Step-through pattern       General Gait Details: cues to keep step to pattern d/t continued knee  instability with intermittent buckling (likely d/t residula effects of regional nerve block)   Stairs             Wheelchair Mobility     Tilt Bed    Modified Rankin (Stroke Patients Only)       Balance Overall balance assessment: History of Falls, Needs assistance Sitting-balance support: Feet supported Sitting balance-[name]: Good     Standing balance support: Bilateral upper extremity supported, During functional activity, Reliant on assistive device for balance Standing balance-[name]: Poor                              Cognition Arousal: Alert Behavior During Therapy: WFL for tasks assessed/performed Overall Cognitive Status: Within Functional Limits for tasks assessed                                          Exercises Total Joint Exercises Ankle Circles/Pumps: AROM, Both, 10 reps Quad Sets: AROM, Both, 5 reps    General Comments        Pertinent Vitals/Pain Pain Assessment Pain Assessment: 0-10 Pain Score: 2  Pain Location: R knee, R ankle and chronic back pain Pain Descriptors / Indicators: Aching, Constant, Discomfort, Dull, Grimacing, Operative site guarding Pain Intervention(s): Limited activity within patient's tolerance, Monitored during session, Premedicated before session, Repositioned    Home Living  Prior Function            PT Goals (current goals can now be found in the care plan section) Acute Rehab PT Goals Patient Stated Goal: to get back to doing everything before ca PT Goal Formulation: With patient Time For Goal Achievement: 05/08/23 Potential to Achieve Goals: Good Progress towards PT goals: Progressing toward goals    Frequency    7X/week      PT Plan      Co-evaluation              AM-PAC PT 6 Clicks Mobility   Outcome Measure  Help needed turning from your back to your side while in a flat bed without using bedrails?: A  Little Help needed moving from lying on your back to sitting on the side of a flat bed without using bedrails?: A Little Help needed moving to and from a bed to a chair (including a wheelchair)?: A Little Help needed standing up from a chair using your arms (e.g., wheelchair or bedside chair)?: A Little Help needed to walk in hospital room?: A Lot Help needed climbing 3-5 steps with a railing? : Total 6 Click Score: 15    End of Session Equipment Utilized During Treatment: Gait belt Activity Tolerance: Patient tolerated treatment well Patient left: in chair;with call bell/phone within reach;with chair alarm set Nurse Communication: Mobility status PT Visit Diagnosis: Unsteadiness on feet (R26.81);Other abnormalities of gait and mobility (R26.89);Muscle weakness (generalized) (M62.81);Difficulty in walking, not elsewhere classified (R26.2);Pain;History of falling (Z91.81) Pain - Right/Left: Right Pain - part of body: Knee;Leg;Ankle and joints of foot (back)     Time: 9040-8974 PT Time Calculation (min) (ACUTE ONLY): 26 min  Charges:    $Gait Training: 23-37 mins PT General Charges $$ ACUTE PT VISIT: 1 Visit                     Jeaneane Adamec, PT  Acute Rehab Dept [facility]) [phone]  04/25/2023      [facility] 04/25/2023, 11:13 AM

## 2023-04-25 NOTE — Progress Notes (Signed)
     1 Day Post-Op Procedure(s) (LRB): TOTAL KNEE ARTHROPLASTY (Right)  Subjective:  Patient reports pain as mild.  Slept okay.  Demonstrates movement of right leg without notable discomfort.  Denies chest pain, ShOB, N/V.  Denies numbness or tingling.  Eager to mobilize with PT today.  Ice packs over knee.  No other complaints at this time.  Objective:   VITALS:   Vitals:   04/24/23 1755 04/24/23 2154 04/25/23 0136 04/25/23 0557  BP: (!) 161/98 (!) 146/83 (!) 145/79 (!) 154/88  Pulse: (!) 104 (!) 110 (!) 105 (!) 103  Resp: 18 17 17 17   Temp: 98.2 F (36.8 C) 98 F (36.7 C) 98.1 F (36.7 C) 97.8 F (36.6 C)  TempSrc: Oral Oral Oral Oral  SpO2: 96% 95% 98% 97%  Weight:      Height:        AAOx4, resting comfortably in bed in NAD  Neurovascular intact Sensation intact distally Intact pulses distally Dorsiflexion/Plantar flexion intact Incision: dressing C/D/I Compartment soft Wiggles toes appropriately   Lab Results  Component Value Date   WBC 17.6 (H) 04/25/2023   HGB 11.0 (L) 04/25/2023   HCT 32.4 (L) 04/25/2023   MCV 86.9 04/25/2023   PLT 179 04/25/2023   BMET    Component Value Date/Time   NA 131 (L) 04/25/2023 0326   NA 142 12/09/2021 1339   K 3.8 04/25/2023 0326   CL 99 04/25/2023 0326   CO2 21 (L) 04/25/2023 0326   GLUCOSE 197 (H) 04/25/2023 0326   BUN 28 (H) 04/25/2023 0326   BUN 21 12/09/2021 1339   CREATININE 0.97 04/25/2023 0326   CREATININE 1.15 (H) 03/27/2023 1151   CALCIUM  8.4 (L) 04/25/2023 0326   EGFR 71 12/09/2021 1339   GFRNONAA >60 04/25/2023 0326   GFRNONAA 52 (L) 03/27/2023 1151     Xray: Stable post-operative imaging.    Assessment/Plan: 1 Day Post-Op   Principal Problem:   Primary osteoarthritis of right knee  Advance diet.  Mobilize with PT, likely DC home today if cleared by PT.  Exam reassuring.  Mild leukocytosis and anemia, likely post-operative.  Post surgical questions and expectations discussed in detail.  Post  op recs: WB: WBAT Abx: ancef  Imaging: PACU xrays DVT prophylaxis: Post 2.5 mg twice daily postop day 1 and 2, resume Eliquis  5 mg postop day 3 Follow up: 2 weeks after surgery for a wound check with Dr. Edna at [facility].  Address: [address], [place], [place] [address]  Office Phone: [phone]   Bernarda CHRISTELLA Mclean 04/25/2023, 7:02 AM   Contact information:   Weekdays 7am-5pm epic message Dr. Edna, or call office for patient follow up: [phone] After hours and holidays please check [email or web address] for group call information for [facility]

## 2023-04-25 NOTE — Progress Notes (Signed)
 PT TX NOTE  04/25/23 1500  PT Visit Information  Last PT Received On 04/25/23  Assistance Needed Pt progressing well, meeting goals.  Husband present for session. Pt is ready to d/c from PT standpoint with family assist. Reviewed RW safety and step to pattern for safety as pt had some knee instability this am; no episodes of knee buckling noted this pm.   History of Present Illness 67 yo female presents to therapy s/p R TKA on 04/24/2023 due to failure of conservative measures. Pt PMH  includes but is not limited to: chronic diarrhea, ILD, A-fib, R ankle fx s/p ORIF,  HTN, DOE, DM II, non Hodgkin's lymphoma, GERD, Crohn's dz, and cervical and thorasic surgery.  Subjective Data  Patient Stated Goal to get back to doing everything before ca  Precautions  Precautions Knee;Fall  Restrictions  Weight Bearing Restrictions Per Provider Order No  Pain Assessment  Pain Assessment 0-10  Pain Score 3  Pain Location R knee, R ankle and chronic back pain  Pain Descriptors / Indicators Aching;Constant;Discomfort;Dull;Grimacing;Operative site guarding  Pain Intervention(s) Limited activity within patient's tolerance;Monitored during session;Repositioned  Cognition  Arousal Alert  Behavior During Therapy WFL for tasks assessed/performed  Overall Cognitive Status Within Functional Limits for tasks assessed  Bed Mobility  General bed mobility comments in recliner  Transfers  Overall transfer level Needs assistance  Equipment used Rolling walker (2 wheels)  Transfers Sit to/from Stand  Sit to Stand Contact guard assist;Supervision  General transfer comment cues for hand placement and safety  Ambulation/Gait  Ambulation/Gait assistance Contact guard assist  Gait Distance (Feet) 50 Feet  Assistive device Rolling walker (2 wheels)  Gait Pattern/deviations Step-to pattern  General Gait Details cues to keep step to pattern d/t intermittent knee instability; pt reports less instability compared to am, no  overt buckling or LOB  Stairs Yes  Stairs assistance Contact guard assist;Min assist  Stair Management No rails;Step to pattern;Backwards;With walker  Number of Stairs 3  General stair comments cues for sequence and technique. no knee buckling noted, no LOB; posterior technique used for safety d/t intermittent knee instability earlier  Balance  Overall balance assessment History of Falls;Needs assistance  Sitting-balance support Feet supported  Sitting balance-[name] Good  Standing balance support Bilateral upper extremity supported;During functional activity;Reliant on assistive device for balance  Standing balance-[name] Poor  Exercises  Exercises Total Joint  Total Joint Exercises  Quad Sets AROM;Both;5 reps  Heel Slides AAROM;Right;5 reps  Straight Leg Raises AROM;Right;5 reps  PT - End of Session  Equipment Utilized During Treatment Gait belt  Activity Tolerance Patient tolerated treatment well  Patient left in chair;with call bell/phone within reach;with chair alarm set;with family/visitor present  Nurse Communication Mobility status   PT - Assessment/Plan  PT Visit Diagnosis Unsteadiness on feet (R26.81);Other abnormalities of gait and mobility (R26.89);Muscle weakness (generalized) (M62.81);Difficulty in walking, not elsewhere classified (R26.2);Pain;History of falling (Z91.81)  Pain - Right/Left Right  Pain - part of body Knee  PT Frequency (ACUTE ONLY) 7X/week  Follow Up Recommendations Follow physician's recommendations for discharge plan and follow up therapies  Patient can return home with the following A lot of help with walking and/or transfers;A lot of help with bathing/dressing/bathroom;Assistance with cooking/housework;Help with stairs or ramp for entrance;Assist for transportation  PT equipment Rolling walker (2 wheels)  AM-PAC PT 6 Clicks Mobility Outcome Measure (Version 2)  Help needed turning from your back to your side while in a flat bed without  using bedrails? 3  Help needed moving from lying on your back to sitting on the side of a flat bed without using bedrails? 3  Help needed moving to and from a bed to a chair (including a wheelchair)? 3  Help needed standing up from a chair using your arms (e.g., wheelchair or bedside chair)? 3  Help needed to walk in hospital room? 3  Help needed climbing 3-5 steps with a railing?  3  6 Click Score 18  Consider Recommendation of Discharge To: Home with [facility]  PT Goal Progression  Progress towards PT goals Progressing toward goals  Acute Rehab PT Goals  PT Goal Formulation With patient  Time For Goal Achievement 05/08/23  Potential to Achieve Goals Good  PT Time Calculation  PT Start Time (ACUTE ONLY) 1457  PT Stop Time (ACUTE ONLY) 1512  PT Time Calculation (min) (ACUTE ONLY) 15 min  PT General Charges  $$ ACUTE PT VISIT 1 Visit  PT Treatments  $Gait Training 8-22 mins

## 2023-04-27 ENCOUNTER — Encounter (HOSPITAL_COMMUNITY): Payer: Self-pay | Admitting: Orthopedic Surgery

## 2023-04-27 NOTE — Discharge Summary (Addendum)
 Physician Discharge Summary  Patient ID: Lillyth Spong MRN: [id number] DOB/AGE: 25-Aug-1955 68 y.o.  Admit date: 04/24/2023 Discharge date: 04/25/23  Admission Diagnoses:  Primary osteoarthritis of right knee  Discharge Diagnoses:  Principal Problem:   Primary osteoarthritis of right knee   Past Medical History:  Diagnosis Date   A-fib (HCC)    Achalasia    Allergy    year around allergies   Arthritis    fingers, knees, neck, back-osteoarthristis   Bronchitis    hx of   Complication of anesthesia    Crohn's disease (HCC)    Dysrhythmia    a. 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 female who was admitted 04/24/2023 with a diagnosis of Primary osteoarthritis of right knee and went to the operating room on 04/24/2023 and underwent the above named procedures.    She was given perioperative antibiotics:  Anti-infectives (From admission, onward)    Start     Dose/Rate Route Frequency Ordered Stop   04/24/23 1700  ceFAZolin  (ANCEF ) IVPB 2g/100 mL premix        2 g 200 mL/hr over 30 Minutes Intravenous Every 6 hours 04/24/23 1549 04/24/23 2341   04/24/23 0830  ceFAZolin  (ANCEF ) IVPB 2g/100 mL premix        2 g 200 mL/hr over 30 Minutes Intravenous On call to O.R. 04/24/23 9173 04/24/23 1136     .  She was given sequential compression devices, early ambulation, and Eliquis  for DVT prophylaxis.  She benefited maximally from the hospital stay and there were no complications.     Recent vital signs:  Vitals:   04/25/23 0949 04/25/23 1309  BP: (!) 147/87 (!) 158/97  Pulse: (!) 102 97  Resp: 17 16  Temp: 97.8 F (36.6 C) 99.4 F (37.4 C)  SpO2: 96% 98%    Recent laboratory studies:  Lab Results  Component Value Date   HGB 11.0 (L) 04/25/2023   HGB 12.8 04/11/2023   HGB 13.8 03/27/2023   Lab Results  Component Value Date   WBC 17.6 (H) 04/25/2023   PLT 179 04/25/2023   Lab Results  Component Value Date   INR 1.0 11/22/2022   Lab Results  Component Value Date   NA 131 (L) 04/25/2023   K 3.8 04/25/2023   CL 99 04/25/2023   CO2 21 (L) 04/25/2023   BUN 28 (H) 04/25/2023   CREATININE 0.97 04/25/2023   GLUCOSE 197 (H) 04/25/2023    Discharge Medications:   Allergies as of 04/25/2023       Reactions   Latex Other (See Comments)   Blisters / skin peeling   Codeine Nausea And Vomiting, Other (See Comments)   Hallucinations also   Atenolol  Cough   Other Itching, Swelling, Other (See Comments)   DermaPlast or ANY SURGICAL GLUE Used after surgery - caused swelling, itching, and wound broke open   Hydrocodone Nausea And Vomiting   Oxycodone  Nausea And Vomiting  Medication List     STOP taking these medications    naproxen  375 MG tablet Commonly known as: NAPROSYN        TAKE these medications    Accu-Chek Guide test strip Generic drug: glucose blood Use to test blood sugars up to 4 times daily as directed.   Accu-Chek Guide w/Device Kit Use to test blood sugars up to 4 times daily.   Accu-Chek Softclix Lancets lancets Use to test blood sugars up to 4 times daily as directed.   acetaminophen  500 MG tablet Commonly known as: TYLENOL  Take 2 tablets (1,000 mg total) by mouth every 8 (eight) hours as needed.   apixaban  5 MG Tabs tablet Commonly known as: ELIQUIS  Take 1 tablet (5 mg total) by mouth 2 (two) times daily. Take 0.5 tablets (2.5 mg total) by mouth 2 (two) times daily for the first and second day after  surgery.  Then on the third day after surgery, resume your normal dose of 1 tablet (5 mg total) by mouth 2 (two) times daily. What changed: additional instructions   Benadryl  Allergy 25 MG tablet Generic drug: diphenhydrAMINE  Take 25 mg by mouth at bedtime.   Cyltezo-CD/UC/HS Starter 40 MG/0.8ML Ajkt Generic drug: Adalimumab-adbm Inject 160 mLs into the skin every 14 (fourteen) days. Started 09/10/2022.   Florastor 250 MG capsule Generic drug: saccharomyces boulardii Take 250 mg by mouth in the morning.   FreeStyle Libre 2 Sensor Misc Inject 1 Device into the skin every 14 (fourteen) days.   HumaLOG  KwikPen 100 UNIT/ML KwikPen Generic drug: insulin  lispro Inject 5 Units into the skin with breakfast, with lunch, and with evening meal. What changed:  how much to take when to take this additional instructions   insulin  detemir 100 UNIT/ML FlexPen Commonly known as: LEVEMIR  Inject 10 Units into the skin daily.   lidocaine  5 % Commonly known as: LIDODERM  Place 1 patch onto the skin daily. Remove & Discard patch within 12 hours or as directed by MD   losartan  50 MG tablet Commonly known as: COZAAR  Take 1 tablet (50 mg total) by mouth daily.   meclizine  25 MG tablet Commonly known as: ANTIVERT  Take 1 tablet (25 mg total) by mouth 3 (three) times daily as needed for dizziness.   Melatonin 10 MG Tabs Take 10 mg by mouth at bedtime.   meloxicam  15 MG tablet Commonly known as: MOBIC  Take 1 tablet (15 mg total) by mouth daily for 14 days.   metFORMIN  500 MG tablet Commonly known as: GLUCOPHAGE  Take 500 mg by mouth 2 (two) times daily with a meal.   methocarbamol  500 MG tablet Commonly known as: ROBAXIN  Take 1 tablet (500 mg total) by mouth every 8 (eight) hours as needed for up to 10 days for muscle spasms.   metoprolol  succinate 100 MG 24 hr tablet Commonly known as: TOPROL -XL Take 1 tablet (100 mg total) by mouth daily. Take with or immediately following a meal.    multivitamin tablet Take 1 tablet by mouth daily with breakfast.   omeprazole 20 MG capsule Commonly known as: PRILOSEC Take 20 mg by mouth daily.   ondansetron  4 MG tablet Commonly known as: Zofran  Take 1 tablet (4 mg total) by mouth every 8 (eight) hours as needed for up to 14 days for nausea or vomiting.   pantoprazole  40 MG tablet Commonly known as: PROTONIX  Take 1 tablet (40 mg total) by mouth 2 (two) times daily.   Pentips 32G X 4 MM Misc Generic drug: Insulin  Pen  Needle Use to inject insulin  up to 4 times daily as directed.   polyethylene glycol powder 17 GM/SCOOP powder Commonly known as: GLYCOLAX /MIRALAX  Take 17 g by mouth daily.   rosuvastatin  10 MG tablet Commonly known as: CRESTOR  Take 10 mg by mouth daily.   traMADol  50 MG tablet Commonly known as: ULTRAM  Take 2 tablets (100 mg total) by mouth every 8 (eight) hours as needed for moderate pain.   TURMERIC PO Take 1 capsule by mouth every morning.   venlafaxine  XR 37.5 MG 24 hr capsule Commonly known as: EFFEXOR -XR Take 37.5 mg by mouth daily with breakfast.        Diagnostic Studies: DG Knee Right Port Result Date: 04/24/2023 CLINICAL DATA:  Postop right knee. EXAM: PORTABLE RIGHT KNEE - 1-2 VIEW COMPARISON:  None Available. FINDINGS: Right knee arthroplasty in expected alignment. No periprosthetic lucency or fracture. There has been patellar resurfacing. Recent postsurgical change includes air and edema in the soft tissues and joint space. Chronic appearing corticated density both posteromedial aspect of the proximal tibia. IMPRESSION: Right knee arthroplasty without immediate postoperative complication. Electronically Signed   By: Andrea Gasman M.D.   On: 04/24/2023 18:19    Disposition: Discharge disposition: 01-Home or Self Care       Discharge Instructions     Call MD / Call 911   Complete by: As directed    If you experience chest pain or shortness of breath, CALL 911 and be transported  to the hospital emergency room.  If you develope a fever above 101 F, pus (white drainage) or increased drainage or redness at the wound, or calf pain, call your surgeon's office.   Constipation Prevention   Complete by: As directed    Drink plenty of fluids.  Prune juice may be helpful.  You may use a stool softener, such as Colace (over the counter) 100 mg twice a day.  Use MiraLax  (over the counter) for constipation as needed.   Diet - low sodium heart healthy   Complete by: As directed    Do not put a pillow under the knee. Place it under the heel.   Complete by: As directed    Driving restrictions   Complete by: As directed    No driving for 4-6 weeks   Increase activity slowly as tolerated   Complete by: As directed    Post-operative opioid taper instructions:   Complete by: As directed    POST-OPERATIVE OPIOID TAPER INSTRUCTIONS: It is important to wean off of your opioid medication as soon as possible. If you do not need pain medication after your surgery it is ok to stop day one. Opioids include: Codeine, Hydrocodone(Norco, Vicodin), Oxycodone (Percocet, oxycontin ) and hydromorphone  amongst others.  Long term and even short term use of opiods can cause: Increased pain response Dependence Constipation Depression Respiratory depression And more.  Withdrawal symptoms can include Flu like symptoms Nausea, vomiting And more Techniques to manage these symptoms Hydrate well Eat regular healthy meals Stay active Use relaxation techniques(deep breathing, meditating, yoga) Do Not substitute Alcohol  to help with tapering If you have been on opioids for less than two weeks and do not have pain than it is ok to stop all together.  Plan to wean off of opioids This plan should start within one week post op of your joint replacement. Maintain the same interval or time between taking each dose and first decrease the dose.  Cut the total daily intake of opioids by one tablet each  day  Next start to increase the time between doses. The last dose that should be eliminated is the evening dose.      TED hose   Complete by: As directed    Use stockings (TED hose) for 2 weeks on both leg(s).  Then for 2 more weeks on the surgical leg.  You may remove them at night for sleeping.        Follow-up Information     Edna Toribio LABOR, MD Follow up.   Specialty: Orthopedic Surgery Contact information: [address] [place] [address] [phone]                    Discharge Instructions      INSTRUCTIONS AFTER JOINT REPLACEMENT   Remove items at home which could result in a fall. This includes throw rugs or furniture in walking pathways ICE to the affected joint every three hours while awake for 30 minutes at a time, for at least the first 3-5 days, and then as needed for pain and swelling.  Continue to use ice for pain and swelling. You may notice swelling that will progress down to the foot and ankle.  This is normal after surgery.  Elevate your leg when you are not up walking on it.   Continue to use the breathing machine you got in the hospital (incentive spirometer) which will help keep your temperature down.  It is common for your temperature to cycle up and down following surgery, especially at night when you are not up moving around and exerting yourself.  The breathing machine keeps your lungs expanded and your temperature down.  DIET:  As you were doing prior to hospitalization, we recommend a well-balanced diet.  DRESSING / WOUND CARE / SHOWERING:  Keep the surgical dressing until follow up.  The dressing is water  proof, so you can shower without any extra covering.  IF THE DRESSING FALLS OFF or the wound gets wet inside, change the dressing with sterile gauze.  Please use good hand washing techniques before changing the dressing.  Do not use any lotions or creams on the incision until instructed by your surgeon.     ACTIVITY  Increase activity slowly as tolerated, but follow the weight bearing instructions below.   No driving for 6 weeks or until further direction given by your physician.  You cannot drive while taking narcotics.  No lifting or carrying greater than 10 lbs. until further directed by your surgeon. Avoid periods of inactivity such as sitting longer than an hour when not asleep. This helps prevent blood clots.  You may return to work once you are authorized by your doctor.   WEIGHT BEARING: Weight bearing as tolerated with assist device (walker, cane, etc) as directed, use it as long as suggested by your surgeon or therapist, typically at least 4-6 weeks.  EXERCISES  Results after joint replacement surgery are often greatly improved when you follow the exercise, range of motion and muscle strengthening exercises prescribed by your doctor. Safety measures are also important to protect the joint from further injury. Any time any of these exercises cause you to have increased pain or swelling, decrease what you are doing until you are comfortable again and then slowly increase them. If you have problems or questions, call your caregiver or physical therapist for advice.   Rehabilitation is important following a joint replacement. After just a few days of immobilization, the muscles of the leg can become weakened and shrink (  atrophy).  These exercises are designed to build up the tone and strength of the thigh and leg muscles and to improve motion. Often times heat used for twenty to thirty minutes before working out will loosen up your tissues and help with improving the range of motion but do not use heat for the first two weeks following surgery (sometimes heat can increase post-operative swelling).   These exercises can be done on a training (exercise) mat, on the floor, on a table or on a bed. Use whatever works the best and is most comfortable for you.    Use music or television while you are  exercising so that the exercises are a pleasant break in your day. This will make your life better with the exercises acting as a break in your routine that you can look forward to.   Perform all exercises about fifteen times, three times per day or as directed.  You should exercise both the operative leg and the other leg as well.  Exercises include:   Quad Sets - Tighten up the muscle on the front of the thigh (Quad) and hold for 5-10 seconds.   Straight Leg Raises - With your knee straight (if you were given a brace, keep it on), lift the leg to 60 degrees, hold for 3 seconds, and slowly lower the leg.  Perform this exercise against resistance later as your leg gets stronger.  Leg Slides: Lying on your back, slowly slide your foot toward your buttocks, bending your knee up off the floor (only go as far as is comfortable). Then slowly slide your foot back down until your leg is flat on the floor again.  Angel Wings: Lying on your back spread your legs to the side as far apart as you can without causing discomfort.  Hamstring Strength:  Lying on your back, push your heel against the floor with your leg straight by tightening up the muscles of your buttocks.  Repeat, but this time bend your knee to a comfortable angle, and push your heel against the floor.  You may put a pillow under the heel to make it more comfortable if necessary.   A rehabilitation program following joint replacement surgery can speed recovery and prevent re-injury in the future due to weakened muscles. Contact your doctor or a physical therapist for more information on knee rehabilitation.   CONSTIPATION:  Constipation is defined medically as fewer than three stools per week and severe constipation as less than one stool per week.  Even if you have a regular bowel pattern at home, your normal regimen is likely to be disrupted due to multiple reasons following surgery.  Combination of anesthesia, postoperative narcotics, change in  appetite and fluid intake all can affect your bowels.   YOU MUST use at least one of the following options; they are listed in order of increasing strength to get the job done.  They are all available over the counter, and you may need to use some, POSSIBLY even all of these options:    Drink plenty of fluids (prune juice may be helpful) and high fiber foods Colace 100 mg by mouth twice a day  Senokot for constipation as directed and as needed Dulcolax (bisacodyl), take with full glass of water   Miralax  (polyethylene glycol) once or twice a day as needed.  If you have tried all these things and are unable to have a bowel movement in the first 3-4 days after surgery call either your surgeon or your  primary doctor.    If you experience loose stools or diarrhea, hold the medications until you stool forms back up.  If your symptoms do not get better within 1 week or if they get worse, check with your doctor.  If you experience the worst abdominal pain ever or develop nausea or vomiting, please contact the office immediately for further recommendations for treatment.  ITCHING:  If you experience itching with your medications, try taking only a single pain pill, or even half a pain pill at a time.  You can also use Benadryl  over the counter for itching or also to help with sleep.   TED HOSE STOCKINGS:  Use stockings on both legs until for at least 2 weeks or as directed by physician office. They may be removed at night for sleeping.  MEDICATIONS:  See your medication summary on the "After Visit Summary" that nursing will review with you.  You may have some home medications which will be placed on hold until you complete the course of blood thinner medication.  It is important for you to complete the blood thinner medication as prescribed.  Blood clot prevention (DVT Prophylaxis): After surgery you are at an increased risk for a blood clot. You are to resume your Eliquis  after surgery to help reduce  your risk of getting a blood clot.  For the first two days after surgery, take a half a dose (2.5 mg Eliquis ) twice a day.  Then on the third day, resume your full dose (5 mg Eliquis ) twice a day.  Take your Eliquis  for a minimum of 4 weeks from a post-operative standpoint.  Then follow the direction of your regular prescribing provider.  Signs of a pulmonary embolus (blood clot in the lungs) include sudden short of breath, feeling lightheaded or dizzy, chest pain with a deep breath, rapid pulse rapid breathing.  Signs of a blood clot in your arms or legs include new unexplained swelling and cramping, warm, red or darkened skin around the painful area.  Please call the office or 911 right away if these signs or symptoms develop.  PRECAUTIONS:   If you experience chest pain or shortness of breath - call 911 immediately for transfer to the hospital emergency department.   If you develop a fever greater that 101 F, purulent drainage from wound, increased redness or drainage from wound, foul odor from the wound/dressing, or calf pain - CONTACT YOUR SURGEON.                                                   FOLLOW-UP APPOINTMENTS:  If you do not already have a post-op appointment, please call the office for an appointment to be seen by your surgeon.  Guidelines for how soon to be seen are listed in your "After Visit Summary", but are typically between 2-3 weeks after surgery.  If you have a specialized bandage, you may be told to follow up 1 week after surgery.  OTHER INSTRUCTIONS:  Knee Replacement:  Do not place pillow under knee, focus on keeping the knee straight while resting.  Place foam block, curve side up under heel at all times except when walking.  DO NOT modify, tear, cut, or change the foam block in any way.  POST-OPERATIVE OPIOID TAPER INSTRUCTIONS: It is important to wean off of your opioid medication as soon  as possible. If you do not need pain medication after your surgery it is ok to stop day  one. Opioids include: Codeine, Hydrocodone(Norco, Vicodin), Oxycodone (Percocet, oxycontin ) and hydromorphone  amongst others.  Long term and even short term use of opiods can cause: Increased pain response Dependence Constipation Depression Respiratory depression And more.  Withdrawal symptoms can include Flu like symptoms Nausea, vomiting And more Techniques to manage these symptoms Hydrate well Eat regular healthy meals Stay active Use relaxation techniques(deep breathing, meditating, yoga) Do Not substitute Alcohol  to help with tapering If you have been on opioids for less than two weeks and do not have pain than it is ok to stop all together.  Plan to wean off of opioids This plan should start within one week post op of your joint replacement. Maintain the same interval or time between taking each dose and first decrease the dose.  Cut the total daily intake of opioids by one tablet each day Next start to increase the time between doses. The last dose that should be eliminated is the evening dose.   MAKE SURE YOU:  Understand these instructions.  Get help right away if you are not doing well or get worse.    Thank you for letting us  be a part of your medical care team.  It is a privilege we respect greatly.  We hope these instructions will help you stay on track for a fast and full recovery!            Signed: Arlean Thies A Pharaoh Pio 04/27/2023, 8:02 AM

## 2023-05-01 ENCOUNTER — Telehealth: Payer: Self-pay

## 2023-05-01 NOTE — Telephone Encounter (Signed)
Patient calling to get scheduled.

## 2023-05-04 ENCOUNTER — Other Ambulatory Visit: Payer: Self-pay

## 2023-05-04 DIAGNOSIS — C8218 Follicular lymphoma grade II, lymph nodes of multiple sites: Secondary | ICD-10-CM

## 2023-05-04 DIAGNOSIS — R918 Other nonspecific abnormal finding of lung field: Secondary | ICD-10-CM

## 2023-05-09 ENCOUNTER — Other Ambulatory Visit: Payer: Self-pay

## 2023-06-14 IMAGING — CT CT CHEST-ABD-PELV W/ CM
2 of 5 series · 13 of 36 positions shown, 15 images · IV contrast (agent unspecified)
Comparison: Multiple priors including most recent PET-CT July 10, 2019

CLINICAL DATA: History of follicular lymphoma, follow-up/restaging.

* Tracking Code: BO *.
EXAM:
CT CHEST, ABDOMEN, AND PELVIS WITH CONTRAST
TECHNIQUE: Multidetector CT imaging of the chest, abdomen and pelvis was
performed following the standard protocol during bolus
administration of intravenous contrast.

[Series 2: cap with · axial · 0.78mm/px · z∈[-725,-225]mm · 10 of 124 slices shown, 12 images]
[im 12/124  mediastinal]
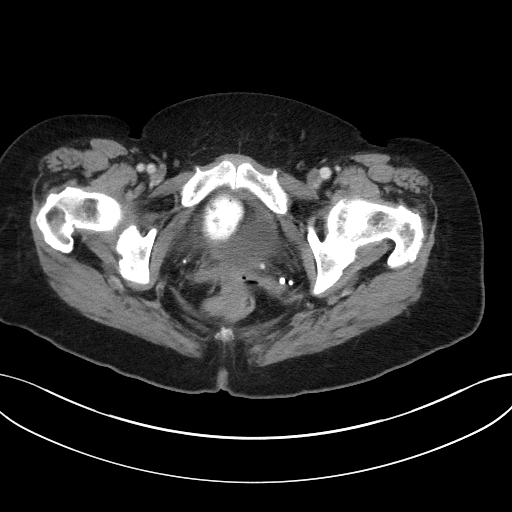
[im 12/124  bone]
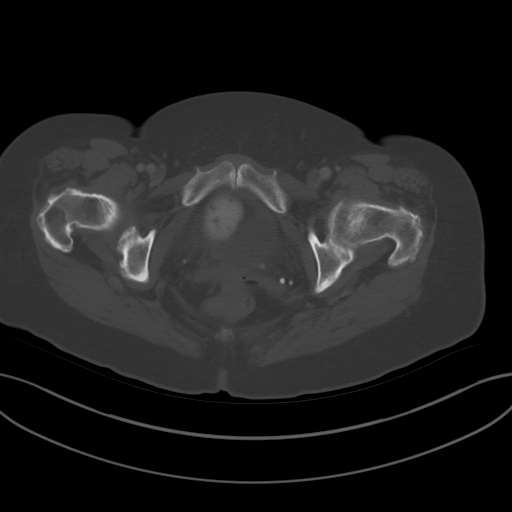
[im 23/124  mediastinal]
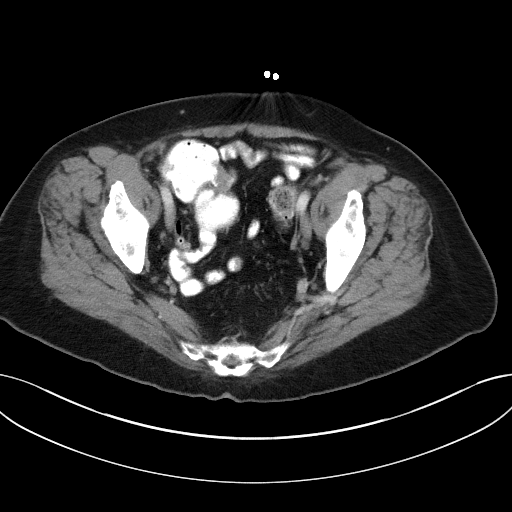
[im 34/124  mediastinal]
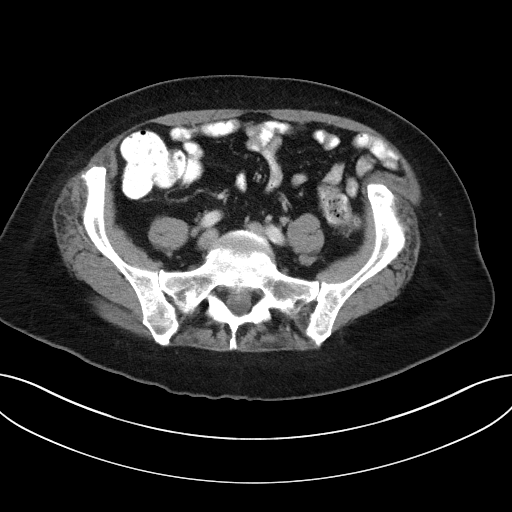
[im 45/124  mediastinal]
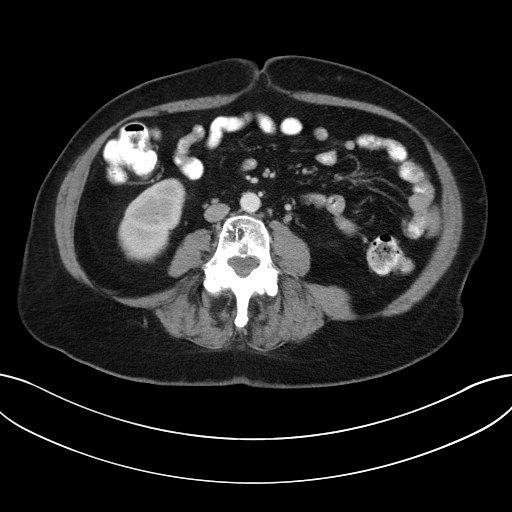
[im 56/124  mediastinal]
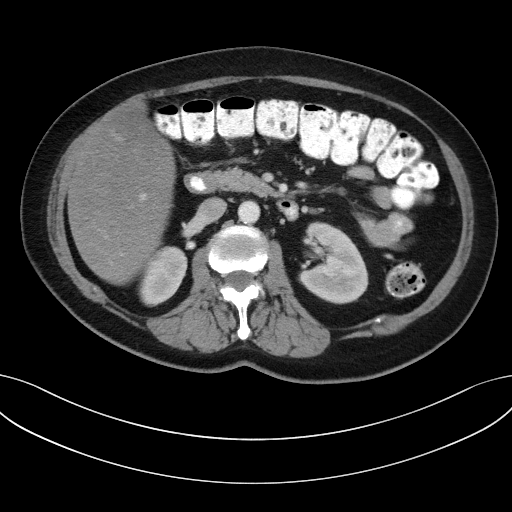
[im 68/124  mediastinal]
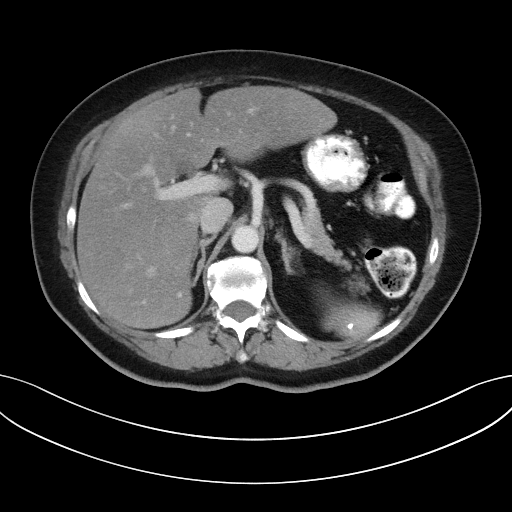
[im 79/124  mediastinal]
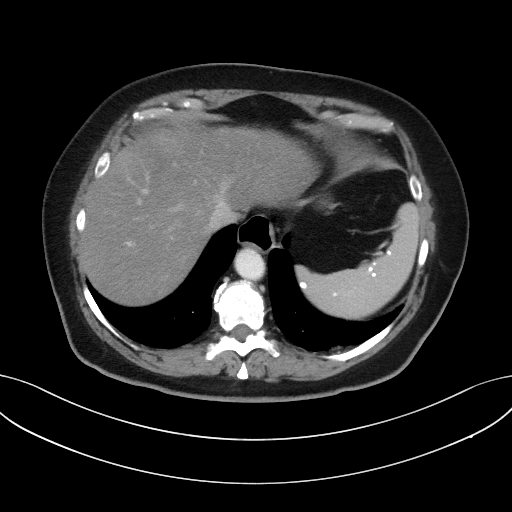
[im 90/124  mediastinal]
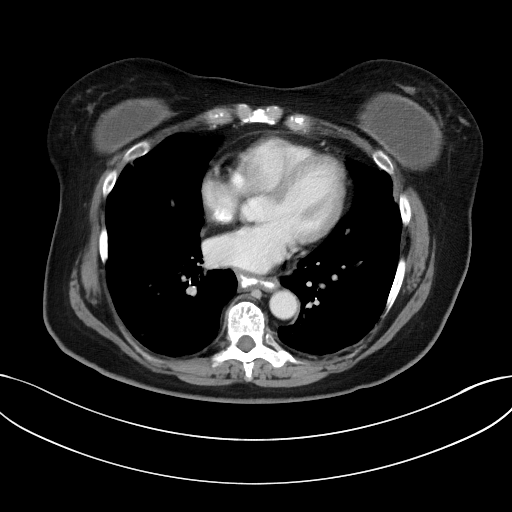
[im 101/124  mediastinal]
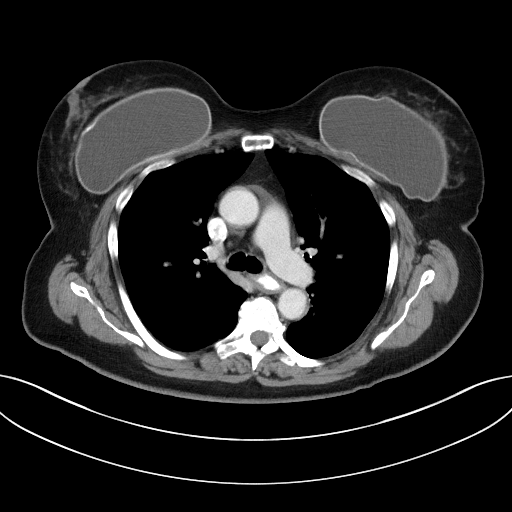
[im 101/124  bone]
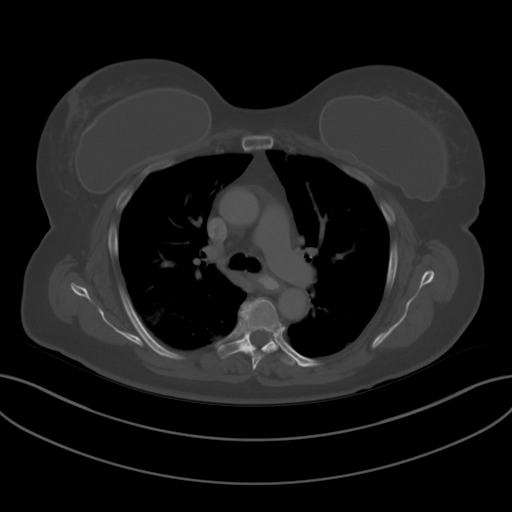
[im 112/124  mediastinal]
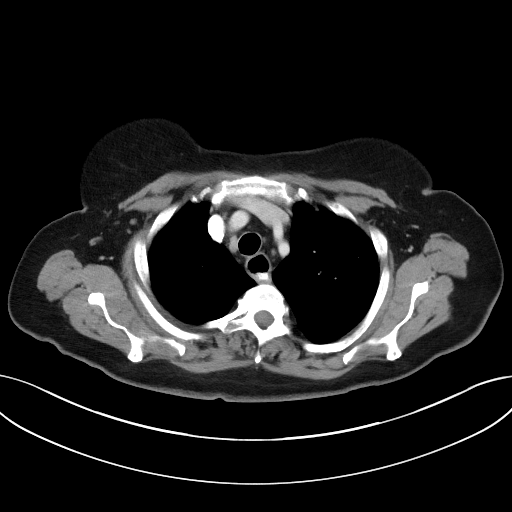

[Series 5: coronals · coronal · 0.80mm/px · 3 of 146 slices shown]
[im 30/146  mediastinal]
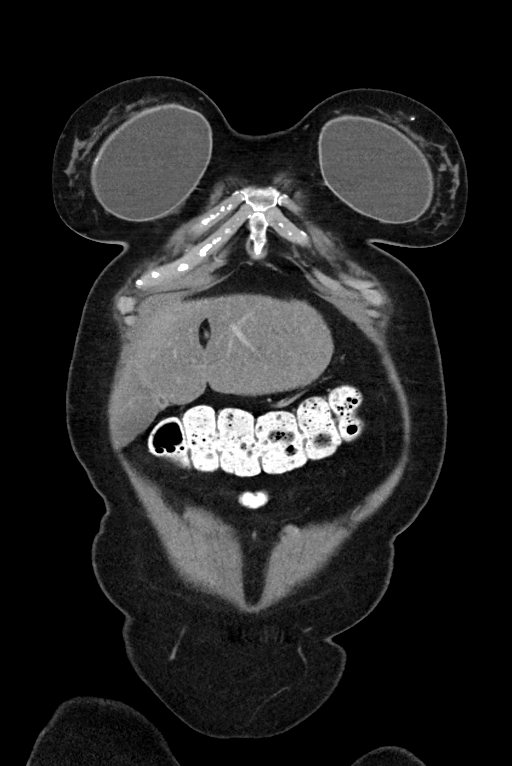
[im 59/146  mediastinal]
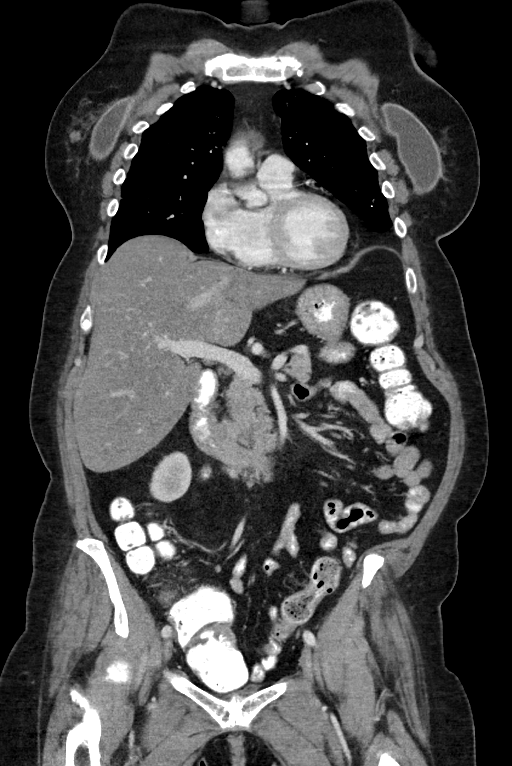
[im 88/146  mediastinal]
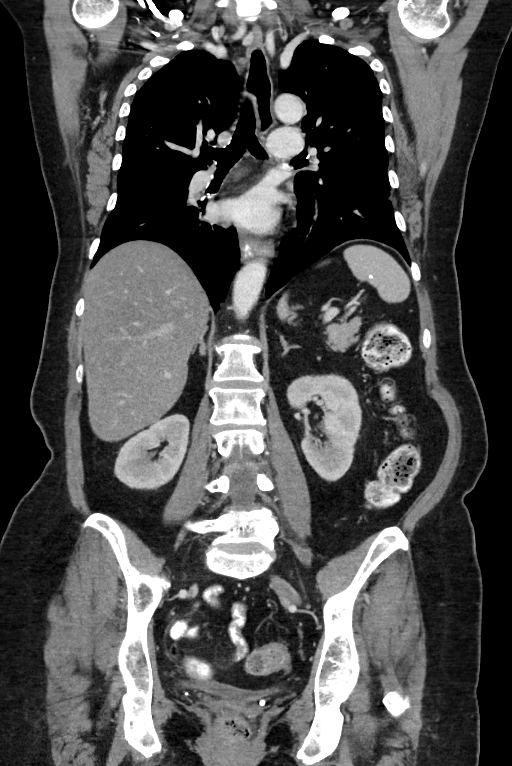

[13 of 36 positions shown; findings below may reference images not displayed]

RADIATION DOSE REDUCTION: This exam was performed according to the
departmental dose-optimization program which includes automated
exposure control, adjustment of the mA and/or kV according to
patient size and/or use of iterative reconstruction technique.

CONTRAST:  100mL OMNIPAQUE IOHEXOL 300 MG/ML  SOLN
FINDINGS: CT CHEST FINDINGS

Cardiovascular: Aortic atherosclerosis without aneurysmal dilation.
No central pulmonary embolus on this nondedicated study. Normal size
heart. No significant pericardial effusion/thickening.

Mediastinum/Nodes: No discrete thyroid nodule. No pathologically
enlarged mediastinal, hilar or axillary lymph nodes. Patulous
thoracic esophagus with retained versus refluxed contrast material
in the esophagus and mild diffuse esophageal wall thickening.

Lungs/Pleura: Extensive multifocal bilateral ground-glass opacities
with interstitial thickening, favored to reflect an infectious or
inflammatory etiology. No pleural effusion. No pneumothorax.

Musculoskeletal: No aggressive lytic or blastic lesion of bone.
Bilateral breast prostheses.

CT ABDOMEN PELVIS FINDINGS

Hepatobiliary: Mild hepatomegaly measuring 19.1 cm in maximum
craniocaudal dimension. Hepatic steatosis. Hypodense hepatic lesions
measure up to 1.3 cm are stable from prior and while technically too
small to accurately characterize are statistically favored to
reflect a benign etiology such as cysts or hemangiomas. Gallbladder
is decompressed. No biliary ductal dilation.

Pancreas: No pancreatic ductal dilation or evidence of acute
inflammation.

Spleen: Calcified splenic granulomata. No splenomegaly or focal
splenic lesion.

Adrenals/Urinary Tract: Bilateral adrenal glands appear normal. No
hydronephrosis. Symmetric bilateral renal enhancement and excretion
of contrast material. Urinary bladder is decompressed limiting
evaluation.

Stomach/Bowel: Radiopaque enteric contrast material traverses the
sigmoid colon. Stomach is unremarkable for degree of distension. No
pathologic dilation of small or large bowel. The appendix and
terminal ileum appear normal. Moderate volume of formed stool
throughout the colon suggestive of constipation. Scattered colonic
diverticulosis without findings of acute diverticulitis.

Vascular/Lymphatic: Aortic and branch vessel atherosclerosis without
abdominal aortic aneurysm. No pathologically enlarged abdominal or
pelvic lymph nodes.

Reproductive: Status post hysterectomy. No adnexal masses.

Other: No abdominal wall hernia or abnormality. No abdominopelvic
ascites.

Musculoskeletal: No aggressive lytic or blastic lesion of bone.
Scattered vertebral body hemangiomas are again identified with the
largest in the L3 and L4 vertebral bodies.
IMPRESSION: 1. No lymphadenopathy within the chest, abdomen, or pelvis.
2. Extensive multifocal bilateral ground-glass opacities with
interstitial thickening, favored to reflect an infectious or
inflammatory etiology. Recommend attention on short-term dedicated
follow-up chest CT.
3. Patulous thoracic esophagus with retained versus refluxed
contrast material in the esophagus and mild diffuse esophageal wall
thickening, suggestive of esophagitis.
4. Moderate volume of formed stool throughout the colon suggestive
of constipation.
5. Mild hepatomegaly and hepatic steatosis.
6. Scattered colonic diverticulosis without findings of acute
diverticulitis.
7.  Aortic Atherosclerosis ([id number].S).

## 2023-06-14 IMAGING — CT CT NECK W/ CM
5 series · 16 of 33 positions shown, 18 images · IV contrast (OMNIPAQUE)
Comparison: 07/10/2019 PET-CT

CLINICAL DATA: Hematologic malignancy, assess treatment response.
Follicular lymphoma

EXAM:
CT NECK WITH CONTRAST
TECHNIQUE: Multidetector CT imaging of the neck was performed using the
standard protocol following the bolus administration of intravenous
contrast.

[Series 2: axial neck · axial · 0.49mm/px · z∈[-184,-102]mm · 2 of 125 slices shown]
[im 42/125  bone]
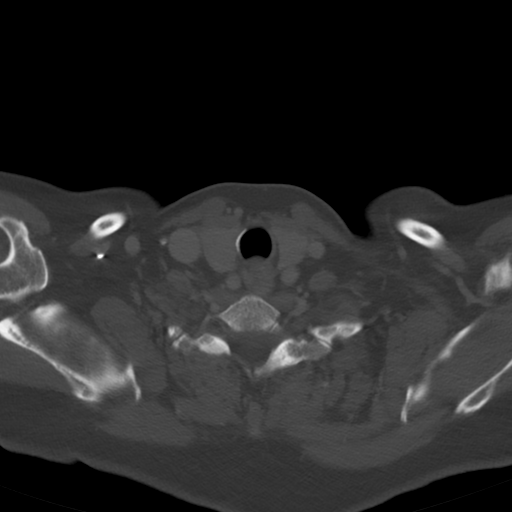
[im 83/125  bone]
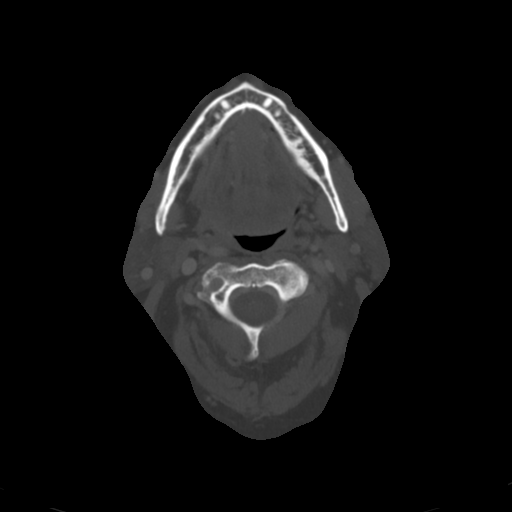

[Series 4: axial bone · axial · 0.49mm/px · z∈[-204,-80]mm · 3 of 125 slices shown, 4 images]
[im 32/125  soft-tissue]
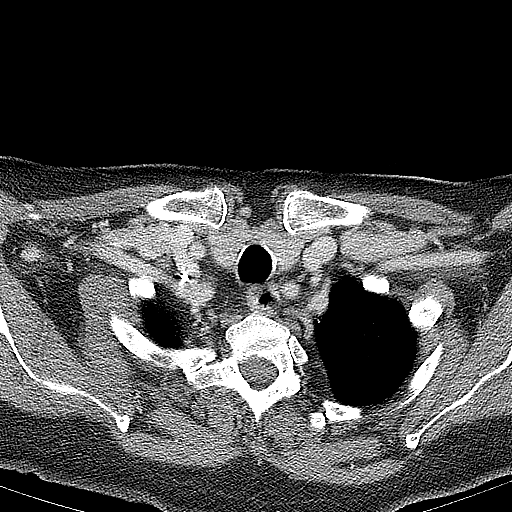
[im 32/125  bone]
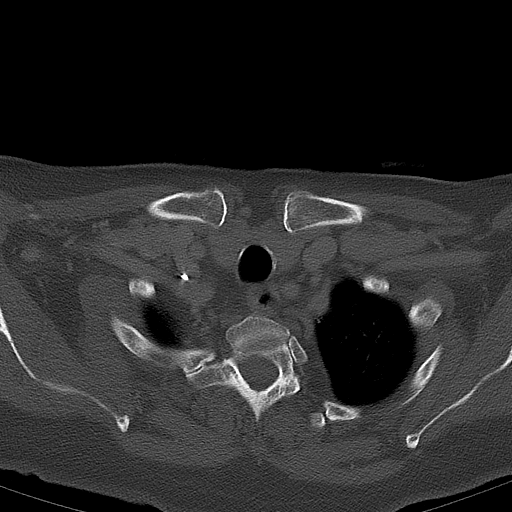
[im 63/125  bone]
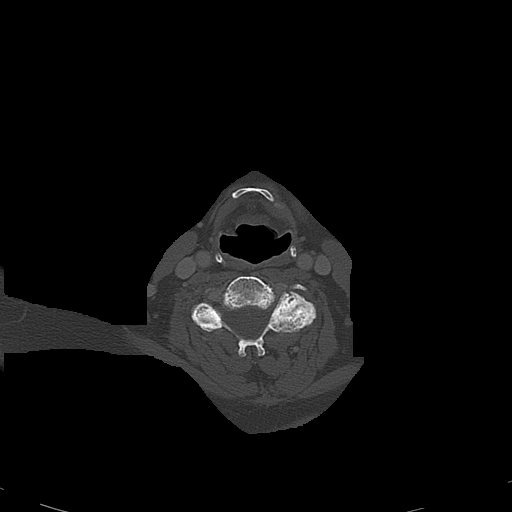
[im 94/125  bone]
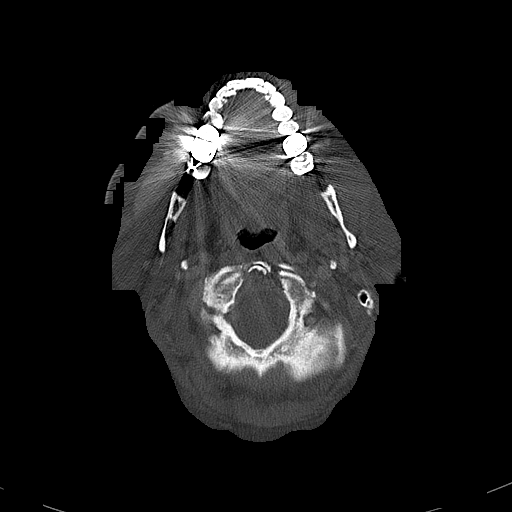

[Series 5: orthogonal (person_name) · axial · 0.41mm/px · z∈[-204,-80]mm · 3 of 125 slices shown]
[im 32/125  bone]
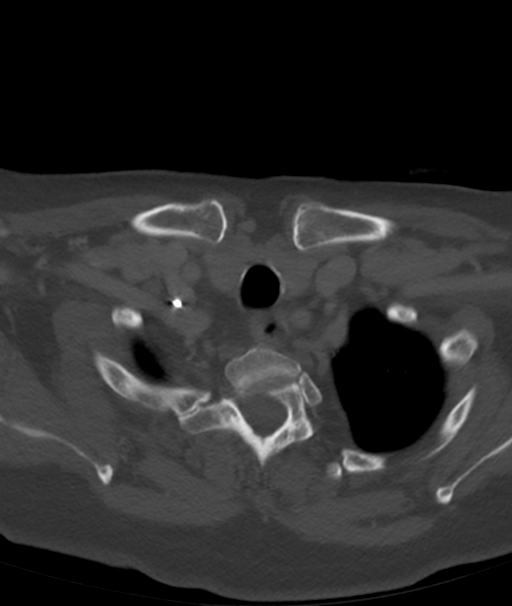
[im 63/125  bone]
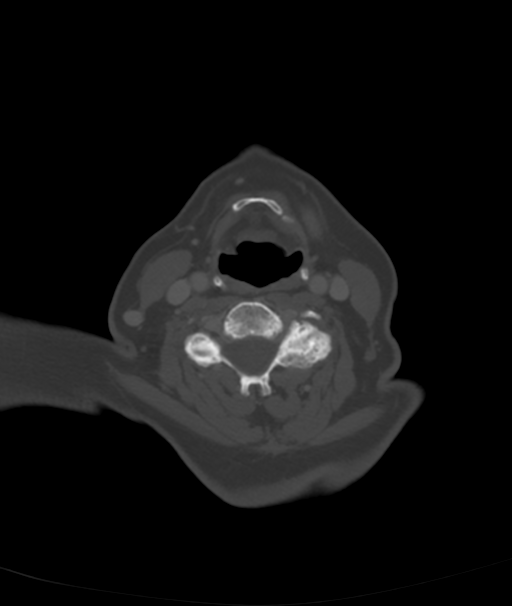
[im 94/125  bone]
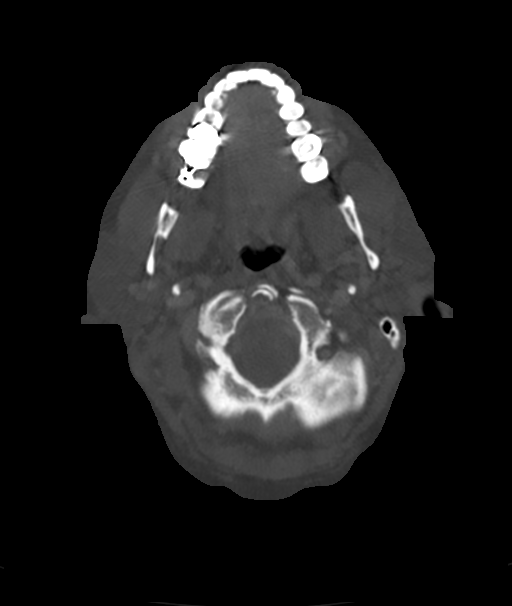

[Series 6: cor neck · coronal · 0.49mm/px · 3 of 114 slices shown]
[im 23/114  bone]
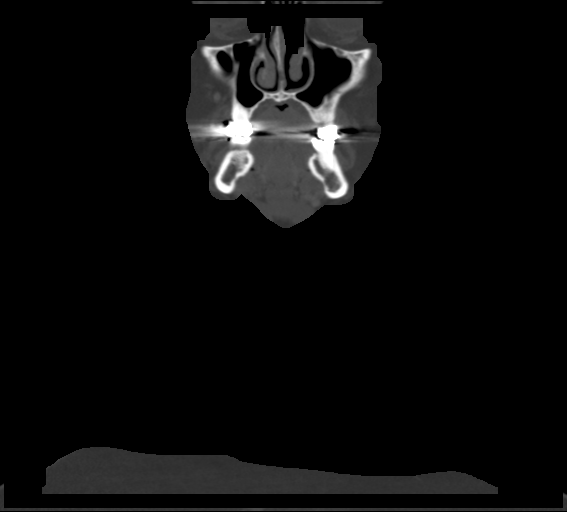
[im 46/114  bone]
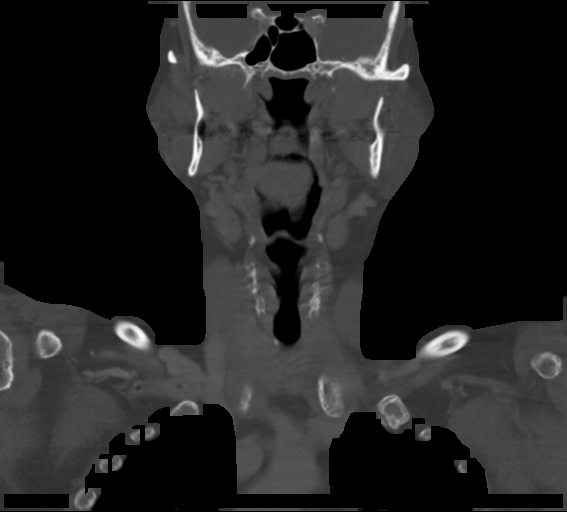
[im 68/114  bone]
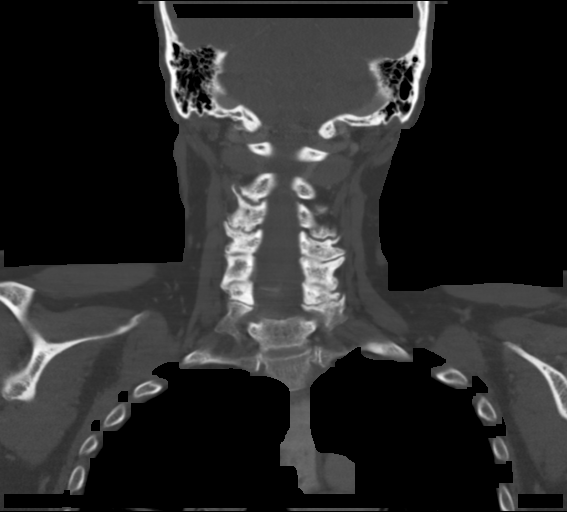

[Series 7: sag neck · sagittal · 0.49mm/px · 5 of 108 slices shown, 6 images]
[im 36/108  bone]
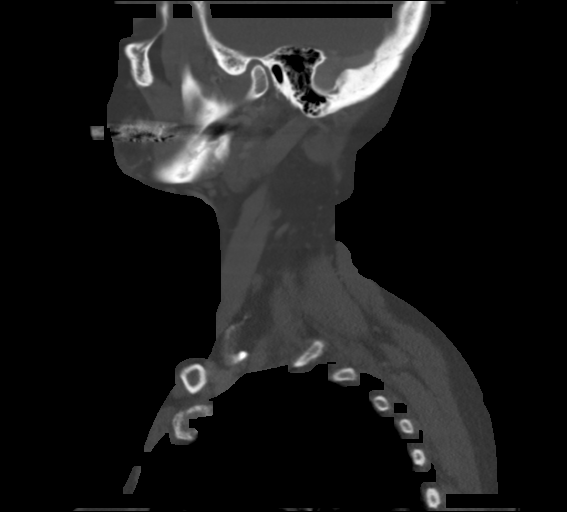
[im 45/108  bone]
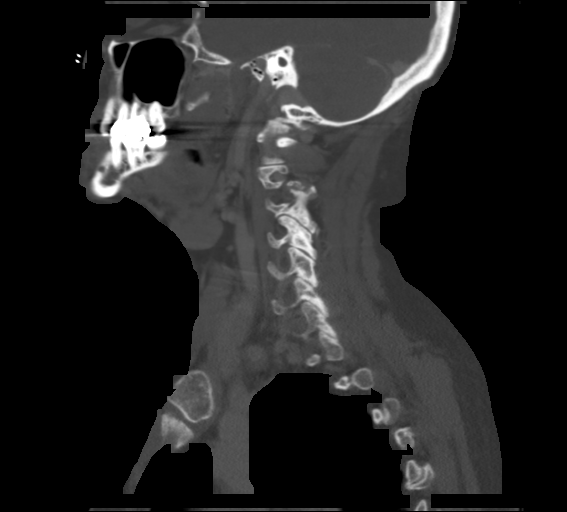
[im 54/108  soft-tissue]
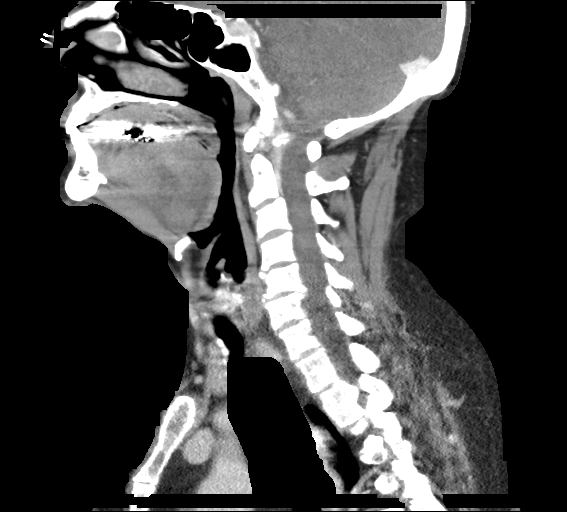
[im 54/108  bone]
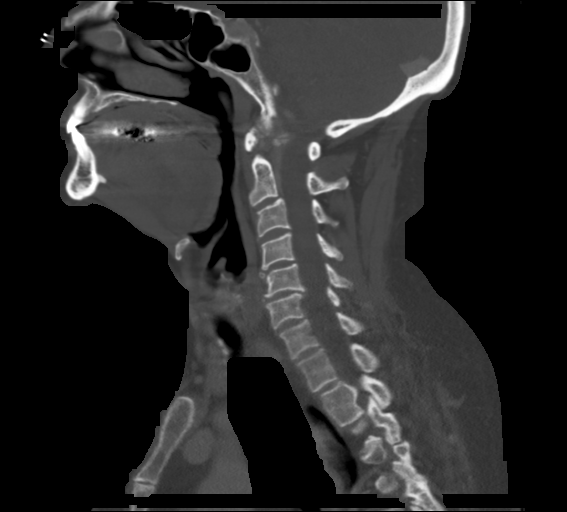
[im 63/108  bone]
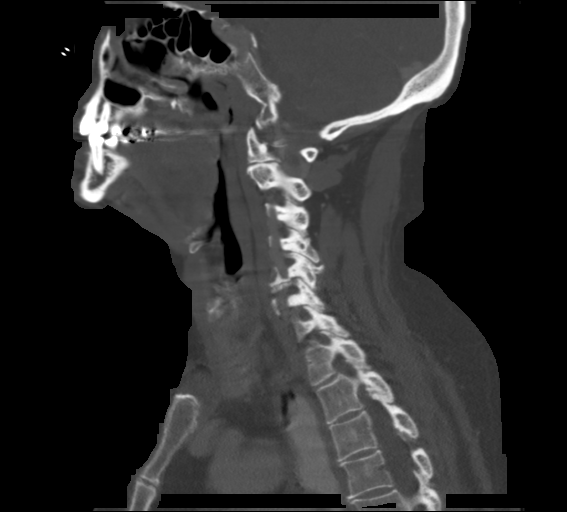
[im 72/108  bone]
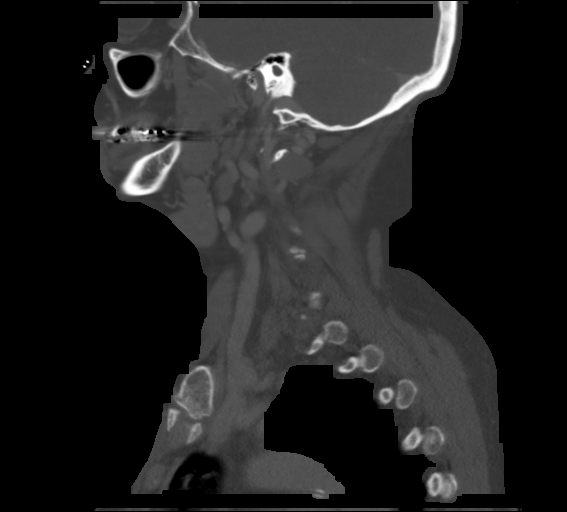

[16 of 33 positions shown; findings below may reference images not displayed]

RADIATION DOSE REDUCTION: This exam was performed according to the
departmental dose-optimization program which includes automated
exposure control, adjustment of the mA and/or kV according to
patient size and/or use of iterative reconstruction technique.

CONTRAST:  100mL OMNIPAQUE IOHEXOL 300 MG/ML  SOLN
FINDINGS: Pharynx and larynx: Normal. No mass or swelling. No thickening of
Waldeyer's ring

Salivary glands: No inflammation, mass, or stone.

Thyroid: Normal.

Lymph nodes: None enlarged or abnormal density.

Vascular: Unremarkable

Limited intracranial: Negative

Visualized orbits: Unremarkable

Mastoids and visualized paranasal sinuses: Secretions seen in the
left maxillary sinus, incidental to the provided history.

Skeleton: No acute or aggressive finding. Generalized degenerative
facet spurring and disc space narrowing.

Upper chest: Reported separately
IMPRESSION: No evidence of disease in the neck.

## 2023-06-19 ENCOUNTER — Other Ambulatory Visit: Payer: Self-pay

## 2023-07-14 ENCOUNTER — Ambulatory Visit: Payer: Medicare Other | Admitting: Internal Medicine

## 2023-07-14 ENCOUNTER — Encounter: Payer: Self-pay | Admitting: Internal Medicine

## 2023-07-14 VITALS — BP 130/82 | HR 71 | Ht 62.0 in | Wt 172.0 lb

## 2023-07-14 DIAGNOSIS — E042 Nontoxic multinodular goiter: Secondary | ICD-10-CM

## 2023-07-14 NOTE — Progress Notes (Signed)
 Name: Meghan Alexander  MRN/ DOB: [id number], [birth year]    Age/ Sex: 68 y.o., female    PCP: Burton Apley, MD   Reason for Endocrinology Evaluation: MNG     Date of Initial Endocrinology Evaluation: 07/14/2023     HPI: Ms. Meghan Alexander is a 68 y.o. female with a past medical history of DM, HTN, Dyslipidemia, A.fib and Hx of lymphoma. The patient presented for initial endocrinology clinic visit on 07/14/2023 for consultative assistance with her MNG.   Pt follows with oncology follicular lymphoma, Dx 2020. S/P  Chemotherapy 2020    Pt had an incidental finding of thyroid nodule on CT imagine in 2024 , which prompted a thyroid ultrasound revealing multiple thyroid nodules 11/2022 requiring surveillance.   Weight stable  She described feeling crowded in the neck, but no swelling  Has occasional dysphagia Denies nausea or vomiting  Denies loose stools or diarrhea , she is on medications for Crohn's disease  Has rare palpitations, has A.Fib  NO recent tremors  She is on Biotin but has not taken it a while   Sister with thyroid disease Luiz Blare' disease)        HISTORY:  Past Medical History:  Past Medical History:  Diagnosis Date   A-fib (HCC)    Achalasia    Allergy    year around allergies   Arthritis    fingers, knees, neck, back-osteoarthristis   Bronchitis    hx of   Complication of anesthesia    Crohn's disease (HCC)    Dysrhythmia    a. 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   GERD (gastroesophageal reflux disease)    hx of reflux-went away with elevating HOB    Hypertension    Pneumonia    hx of    PONV (postoperative nausea and vomiting)    Thyroid nodule    mulitple nodules that are being monitored   Type 2 diabetes mellitus (HCC) 08/02/2021   Past Surgical History:  Past Surgical History:  Procedure Laterality Date   ABDOMINAL HYSTERECTOMY     ADENOIDECTOMY     ANKLE SURGERY Right    BIOPSY   03/10/2022   Procedure: BIOPSY;  Surgeon: Vida Rigger, MD;  Location: [facility] ENDOSCOPY;  Service: Gastroenterology;;   BRONCHIAL BRUSHINGS  08/03/2021   Procedure: BRONCHIAL BRUSHINGS;  Surgeon: Leslye Peer, MD;  Location: [facility] ENDOSCOPY;  Service: Cardiopulmonary;;   BRONCHIAL WASHINGS  08/03/2021   Procedure: BRONCHIAL WASHINGS;  Surgeon: Leslye Peer, MD;  Location: [facility] ENDOSCOPY;  Service: Cardiopulmonary;;   cosmetic surgeries     brow lift and lipsuction on abdominal, breast implants   ESOPHAGEAL MANOMETRY N/A 12/02/2015   Procedure: ESOPHAGEAL MANOMETRY (EM);  Surgeon: Willis Modena, MD;  Location: [facility] ENDOSCOPY;  Service: Endoscopy;  Laterality: N/A;   ESOPHAGOGASTRODUODENOSCOPY N/A 12/02/2015   Procedure: ESOPHAGOGASTRODUODENOSCOPY (EGD);  Surgeon: Willis Modena, MD;  Location: [facility] ENDOSCOPY;  Service: Endoscopy;  Laterality: N/A;   FLEXIBLE SIGMOIDOSCOPY N/A 03/10/2022   Procedure: FLEXIBLE SIGMOIDOSCOPY;  Surgeon: Vida Rigger, MD;  Location: [facility] ENDOSCOPY;  Service: Gastroenterology;  Laterality: N/A;   IR KYPHO THORACIC WITH BONE BIOPSY  11/22/2022   IR RADIOLOGIST EVAL & MGMT  11/08/2022   IR RADIOLOGIST EVAL & MGMT  12/19/2022   IR VERTEBROPLASTY CERV/THOR BX INC UNI/BIL INC/INJECT/IMAGING  10/05/2022   lymph node removal left leg     cat scratch surgery    ORIF ANKLE FRACTURE Right 09/03/2021  Procedure: OPEN REDUCTION INTERNAL FIXATION (ORIF) ANKLE FRACTURE;  Surgeon: Joen Laura, MD;  Location: [facility] OR;  Service: Orthopedics;  Laterality: Right;   THROAT SURGERY     UPPP for sleep apnea   TONSILLECTOMY     TOTAL KNEE ARTHROPLASTY Right 04/24/2023   Procedure: TOTAL KNEE ARTHROPLASTY;  Surgeon: Joen Laura, MD;  Location: [facility] ORS;  Service: Orthopedics;  Laterality: Right;   VIDEO BRONCHOSCOPY  08/03/2021   Procedure: VIDEO BRONCHOSCOPY WITH FLUORO;  Surgeon: Leslye Peer, MD;  Location: [facility] ENDOSCOPY;  Service: Cardiopulmonary;;    Social History:   reports that she has never smoked. She has never used smokeless tobacco. She reports that she does not drink alcohol and does not use drugs. Family History: family history includes Breast cancer in her cousin, maternal aunt, maternal grandmother, and paternal grandmother; Hernia in her mother; Lung cancer in her father.   HOME MEDICATIONS: Allergies as of 07/14/2023       Reactions   Latex Other (See Comments)   Blisters / skin peeling   Atenolol Cough   Other Itching, Swelling, Other (See Comments)   DermaPlast or ANY SURGICAL GLUE Used after surgery - caused swelling, itching, and wound broke open        Medication List        Accurate as of July 14, 2023  2:23 PM. If you have any questions, ask your nurse or doctor.          STOP taking these medications    Accu-Chek Guide w/Device Kit Stopped by: Johnney Ou Mahum Betten   Accu-Chek Softclix Lancets lancets Stopped by: Johnney Ou Koryn Charlot   insulin detemir 100 UNIT/ML FlexPen Commonly known as: LEVEMIR Stopped by: Johnney Ou Isael Stille   lidocaine 5 % Commonly known as: LIDODERM Stopped by: Johnney Ou Melroy Bougher   meclizine 25 MG tablet Commonly known as: ANTIVERT Stopped by: Johnney Ou Kamaiyah Uselton   pantoprazole 40 MG tablet Commonly known as: PROTONIX Stopped by: Johnney Ou Linton Stolp   Pentips 32G X 4 MM Misc Generic drug: Insulin Pen Needle Stopped by: Johnney Ou Jiyan Walkowski   polyethylene glycol powder 17 GM/SCOOP powder Commonly known as: GLYCOLAX/MIRALAX Stopped by: Johnney Ou Pranathi Winfree       TAKE these medications    Accu-Chek Guide test strip Generic drug: glucose blood Use to test blood sugars up to 4 times daily as directed.   apixaban 5 MG Tabs tablet Commonly known as: ELIQUIS Take 1 tablet (5 mg total) by mouth 2 (two) times daily. Take 0.5 tablets (2.5 mg total) by mouth 2 (two) times daily for the first and second day after surgery.  Then on the third day after surgery, resume your  normal dose of 1 tablet (5 mg total) by mouth 2 (two) times daily.   Benadryl Allergy 25 MG tablet Generic drug: diphenhydrAMINE Take 25 mg by mouth at bedtime.   Cyltezo-CD/UC/HS Starter 40 MG/0.8ML Ajkt Generic drug: Adalimumab-adbm Inject 160 mLs into the skin every 14 (fourteen) days. Started 09/10/2022.   Florastor 250 MG capsule Generic drug: saccharomyces boulardii Take 250 mg by mouth in the morning.   FreeStyle Libre 2 Sensor Misc Inject 1 Device into the skin every 14 (fourteen) days.   HumaLOG KwikPen 100 UNIT/ML KwikPen Generic drug: insulin lispro Inject 5 Units into the skin with breakfast, with lunch, and with evening meal. What changed:  how much to take when to take this additional instructions   losartan 50 MG tablet Commonly known as: COZAAR Take  1 tablet (50 mg total) by mouth daily.   Melatonin 10 MG Tabs Take 10 mg by mouth at bedtime.   metFORMIN 500 MG tablet Commonly known as: GLUCOPHAGE Take 500 mg by mouth 2 (two) times daily with a meal.   metoprolol succinate 100 MG 24 hr tablet Commonly known as: TOPROL-XL Take 1 tablet (100 mg total) by mouth daily. Take with or immediately following a meal. What changed: how much to take   multivitamin tablet Take 1 tablet by mouth daily with breakfast.   omeprazole 20 MG capsule Commonly known as: PRILOSEC Take 20 mg by mouth daily.   rosuvastatin 10 MG tablet Commonly known as: CRESTOR Take 10 mg by mouth daily.   traMADol 50 MG tablet Commonly known as: ULTRAM Take 2 tablets (100 mg total) by mouth every 8 (eight) hours as needed for moderate pain.   TURMERIC PO Take 1 capsule by mouth every morning.   venlafaxine XR 37.5 MG 24 hr capsule Commonly known as: EFFEXOR-XR Take 37.5 mg by mouth daily with breakfast.          REVIEW OF SYSTEMS: A comprehensive ROS was conducted with the patient and is negative except as per HPI    OBJECTIVE:  VS: BP 130/82 (BP Location: Left Arm,  Patient Position: Sitting, Cuff Size: Normal)   Pulse 71   Ht 5\' 2"  (1.575 m)   Wt 172 lb (78 kg)   SpO2 98%   BMI 31.46 kg/m    Wt Readings from Last 3 Encounters:  07/14/23 172 lb (78 kg)  04/24/23 162 lb (73.5 kg)  04/11/23 162 lb (73.5 kg)     EXAM: General: Pt appears well and is in NAD  Neck: General: Supple without adenopathy. Thyroid: Thyroid size normal.  No goiter or nodules appreciated.  Lungs: Clear with good BS bilat   Heart: Auscultation: RRR.  Abdomen: Soft, nontender  Extremities:  BL LE: No pretibial edema   Mental Status: Judgment, insight: Intact Orientation: Oriented to time, place, and person Mood and affect: No depression, anxiety, or agitation     DATA REVIEWED:     Latest Reference Range & Units 07/14/23 14:24  TSH 0.40 - 4.50 mIU/L 0.91  T4,Free(Direct) 0.8 - 1.8 ng/dL 1.2    Thyroid Ultrasound 03/15/2023  Estimated total number of nodules >/= 1 cm: 5   Number of spongiform nodules >/=  2 cm not described below (TR1): 0   Number of mixed cystic and solid nodules >/= 1.5 cm not described below (TR2): 0   _________________________________________________________   Multiple small cysts and spongiform nodules are scattered throughout the thyroid gland. The vast majority of these lesions do not meet criteria to warrant biopsy or dedicated imaging surveillance. Individual nodules that do warrant further comment are listed below.   Nodule # 2: Solid hypoechoic nodule in the right mid gland measures 1.0 x 0.8 x 0.9 cm. Margins are well defined. No calcifications or suspicious features. Findings are consistent with TI-RADS category 4. *Given size (>/= 1 - 1.4 cm) and appearance, a follow-up ultrasound in 1 year should be considered based on TI-RADS criteria.   Nodule # 7: Ill-defined isoechoic solid nodule versus pseudo nodule in the left lower gland measures approximately 1.9 x 1.3 x 1.2 cm. No calcifications or suspicious features. If in  fact a true nodule and not a pseudo nodule, findings would be consistent with TI-RADS category 3. *Given size (>/= 1.5 - 2.4 cm) and appearance, a follow-up ultrasound in  1 year should be considered based on TI-RADS criteria.   IMPRESSION: 1. Multiple small cysts and nodules consistent with multinodular goiter. 2. Nodule # 2 is a 1 cm TI-RADS category 4 nodule in the right mid gland and just meets criteria to recommend surveillance. Follow-up ultrasound in 1 year is recommended. 3. Nodule # 7 is a 1.9 cm TI-RADS category 3 nodule versus pseudo nodule and also meets criteria for imaging surveillance.   ASSESSMENT/PLAN/RECOMMENDATIONS:   Multinodular Goiter:   -No local neck symptoms -Patient is clinically euthyroid -We discussed repeating thyroid ultrasound annually for up to 5 years - TFT's are normal   F/u in 7 months   Signed electronically by: Lyndle Herrlich, MD  [facility] Endocr[facility]ology  [facility] [address] [place] [place], [place] [address] Phone: [phone] FAX: [phone]   CC: Burton Apley, MD [address] [place] [address] Phone: [phone] Fax: [phone]   Return to Endocrinology clinic as below: Future Appointments  Date Time Provider Department Center  07/18/2023 10:30 AM Joylene Grapes, NP [facility] None  09/25/2023 11:30 AM [facility] None  09/25/2023 12:00 PM Johney Maine, MD [facility] None  02/13/2024 10:10 AM Abby Tucholski, Konrad Dolores, MD [facility] None

## 2023-07-15 ENCOUNTER — Other Ambulatory Visit: Payer: Self-pay

## 2023-07-17 ENCOUNTER — Encounter: Payer: Self-pay | Admitting: Internal Medicine

## 2023-07-17 LAB — TSH: TSH: 0.91 m[IU]/L (ref 0.40–4.50)

## 2023-07-17 LAB — T4, FREE: Free T4: 1.2 ng/dL (ref 0.8–1.8)

## 2023-07-17 LAB — THYROID PEROXIDASE ANTIBODY: Thyroperoxidase Ab SerPl-aCnc: <1 [IU]/mL (ref <9)

## 2023-07-18 ENCOUNTER — Ambulatory Visit: Payer: Medicare Other | Admitting: Internal Medicine

## 2023-07-18 ENCOUNTER — Ambulatory Visit: Payer: Medicare Other | Attending: Nurse Practitioner | Admitting: Nurse Practitioner

## 2023-07-18 ENCOUNTER — Encounter: Payer: Self-pay | Admitting: Nurse Practitioner

## 2023-07-18 VITALS — BP 128/68 | HR 82 | Ht 62.0 in | Wt 172.4 lb

## 2023-07-18 DIAGNOSIS — J984 Other disorders of lung: Secondary | ICD-10-CM

## 2023-07-18 DIAGNOSIS — E1165 Type 2 diabetes mellitus with hyperglycemia: Secondary | ICD-10-CM | POA: Diagnosis not present

## 2023-07-18 DIAGNOSIS — I10 Essential (primary) hypertension: Secondary | ICD-10-CM

## 2023-07-18 DIAGNOSIS — R911 Solitary pulmonary nodule: Secondary | ICD-10-CM

## 2023-07-18 DIAGNOSIS — C8218 Follicular lymphoma grade II, lymph nodes of multiple sites: Secondary | ICD-10-CM

## 2023-07-18 DIAGNOSIS — I480 Paroxysmal atrial fibrillation: Secondary | ICD-10-CM | POA: Diagnosis not present

## 2023-07-18 MED ORDER — OLMESARTAN MEDOXOMIL 40 MG PO TABS: 40.0000 mg | ORAL_TABLET | Freq: Every day | ORAL | 3 refills | Status: DC

## 2023-07-18 MED ORDER — AMLODIPINE BESYLATE 10 MG PO TABS: 10.0000 mg | ORAL_TABLET | Freq: Every day | ORAL | 3 refills | Status: DC

## 2023-07-18 MED ORDER — METOPROLOL SUCCINATE ER 50 MG PO TB24: 50.0000 mg | ORAL_TABLET | Freq: Every day | ORAL | 3 refills | Status: DC

## 2023-07-18 NOTE — Progress Notes (Unsigned)
 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  Past Medical History    Past Medical History:  Diagnosis Date   A-fib [facility])    Achalasia    Allergy    year around allergies   Arthritis    fingers, knees, neck, back-osteoarthristis   Bronchitis    hx of   Complication of anesthesia    Crohn's disease (HCC)    Dysrhythmia    a. 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   GERD (gastroesophageal reflux disease)    hx of reflux-went away with elevating HOB    Hypertension    Pneumonia    hx of    PONV (postoperative nausea and vomiting)    Thyroid nodule    mulitple nodules that are being monitored   Type 2 diabetes mellitus (HCC) 08/02/2021   Past Surgical History:  Procedure Laterality Date   ABDOMINAL HYSTERECTOMY     ADENOIDECTOMY     ANKLE SURGERY Right    BIOPSY  03/10/2022   Procedure: BIOPSY;  Surgeon: Vida Rigger, MD;  Location: [facility] ENDOSCOPY;  Service: Gastroenterology;;   BRONCHIAL BRUSHINGS  08/03/2021   Procedure: BRONCHIAL BRUSHINGS;  Surgeon: Leslye Peer, MD;  Location: [facility] ENDOSCOPY;  Service: Cardiopulmonary;;   BRONCHIAL WASHINGS  08/03/2021   Procedure: BRONCHIAL WASHINGS;  Surgeon: Leslye Peer, MD;  Location: [facility] ENDOSCOPY;  Service: Cardiopulmonary;;   cosmetic surgeries     brow lift and lipsuction on abdominal, breast implants   ESOPHAGEAL MANOMETRY N/A 12/02/2015   Procedure: ESOPHAGEAL MANOMETRY (EM);  Surgeon: Willis Modena, MD;  Location: [facility] ENDOSCOPY;   Service: Endoscopy;  Laterality: N/A;   ESOPHAGOGASTRODUODENOSCOPY N/A 12/02/2015   Procedure: ESOPHAGOGASTRODUODENOSCOPY (EGD);  Surgeon: Willis Modena, MD;  Location: [facility] ENDOSCOPY;  Service: Endoscopy;  Laterality: N/A;   FLEXIBLE SIGMOIDOSCOPY N/A 03/10/2022   Procedure: FLEXIBLE SIGMOIDOSCOPY;  Surgeon: Vida Rigger, MD;  Location: [facility] ENDOSCOPY;  Service: Gastroenterology;  Laterality: N/A;   IR KYPHO THORACIC WITH BONE BIOPSY  11/22/2022   IR RADIOLOGIST EVAL & MGMT  11/08/2022   IR RADIOLOGIST EVAL & MGMT  12/19/2022   IR VERTEBROPLASTY CERV/THOR BX INC UNI/BIL INC/INJECT/IMAGING  10/05/2022   lymph node removal left leg     cat scratch surgery    ORIF ANKLE FRACTURE Right 09/03/2021   Procedure: OPEN REDUCTION INTERNAL FIXATION (ORIF) ANKLE FRACTURE;  Surgeon: Joen Laura, MD;  Location: [facility] OR;  Service: Orthopedics;  Laterality: Right;   THROAT SURGERY     UPPP for sleep apnea   TONSILLECTOMY     TOTAL KNEE ARTHROPLASTY Right 04/24/2023   Procedure: TOTAL KNEE ARTHROPLASTY;  Surgeon: Joen Laura, MD;  Location: [facility] ORS;  Service: Orthopedics;  Laterality: Right;   VIDEO BRONCHOSCOPY  08/03/2021   Procedure: VIDEO BRONCHOSCOPY WITH FLUORO;  Surgeon: Leslye Peer, MD;  Location: [facility] ENDOSCOPY;  Service: Cardiopulmonary;;    Allergies  Allergies  Allergen Reactions   Latex Other (See Comments)    Blisters / skin peeling   Atenolol Cough   Other Itching, Swelling and Other (See Comments)  DermaPlast or ANY SURGICAL GLUE Used after surgery - caused swelling, itching, and wound broke open     Labs/Other Studies Reviewed    The following studies were reviewed today:  Cardiac Studies & Procedures   ______________________________________________________________________________________________     ECHOCARDIOGRAM  ECHOCARDIOGRAM COMPLETE 10/06/2022  Narrative ECHOCARDIOGRAM REPORT    Patient Name:   Meghan Alexander Date of Exam: 10/06/2022 Medical  Rec #:  [id number]      Height:       62.0 in Accession #:    [id number]     Weight:       176.4 lb Date of Birth:  1955-07-16       BSA:          1.812 m Patient Age:    67 years       BP:           162/96 mmHg Patient Gender: F              HR:           101 bpm. Exam Location:  Inpatient  Procedure: 2D Echo, Cardiac Doppler, Color Doppler and Intracardiac Opacification Agent  Indications:    Edema  History:        Patient has prior history of Echocardiogram examinations, most recent 10/08/2021. Arrythmias:Atrial Fibrillation; Risk Factors:Diabetes and Hypertension. ILD, lymphoma.  Sonographer:    Milda Smart Referring Phys: [id number] ALLISON L ELLIS   Sonographer Comments: Image acquisition challenging due to patient body habitus and Image acquisition challenging due to respiratory motion. IMPRESSIONS   1. Left ventricular ejection fraction, by estimation, is 55 to 60%. The left ventricle has normal function. The left ventricle has no regional wall motion abnormalities. Indeterminate diastolic filling due to E-A fusion. 2. Right ventricular systolic function is normal. The right ventricular size is normal. 3. The mitral valve is normal in structure. Mild mitral valve regurgitation. No evidence of mitral stenosis. 4. The aortic valve is normal in structure. Aortic valve regurgitation is not visualized. No aortic stenosis is present. 5. The inferior vena cava is normal in size with greater than 50% respiratory variability, suggesting right atrial pressure of 3 mmHg.  FINDINGS Left Ventricle: Left ventricular ejection fraction, by estimation, is 55 to 60%. The left ventricle has normal function. The left ventricle has no regional wall motion abnormalities. The left ventricular internal cavity size was normal in size. There is no left ventricular hypertrophy. Indeterminate diastolic filling due to E-A fusion.  Right Ventricle: The right ventricular size is normal. No increase in right  ventricular wall thickness. Right ventricular systolic function is normal.  Left Atrium: Left atrial size was normal in size.  Right Atrium: Right atrial size was normal in size.  Pericardium: There is no evidence of pericardial effusion.  Mitral Valve: The mitral valve is normal in structure. There is mild calcification of the mitral valve leaflet(s). Mild mitral valve regurgitation. No evidence of mitral valve stenosis.  Tricuspid Valve: The tricuspid valve is normal in structure. Tricuspid valve regurgitation is not demonstrated. No evidence of tricuspid stenosis.  Aortic Valve: The aortic valve is normal in structure. Aortic valve regurgitation is not visualized. No aortic stenosis is present.  Pulmonic Valve: The pulmonic valve was not well visualized. Pulmonic valve regurgitation is not visualized. No evidence of pulmonic stenosis.  Aorta: The aortic root is normal in size and structure.  Venous: The inferior vena cava is normal in size with greater than 50% respiratory variability, suggesting right atrial pressure of  3 mmHg.  IAS/Shunts: No atrial level shunt detected by color flow Doppler.   LEFT VENTRICLE PLAX 2D LVIDd:         4.10 cm     Diastology LVIDs:         3.20 cm     LV e' medial:    5.11 cm/s LV PW:         0.90 cm     LV E/e' medial:  22.1 LV IVS:        0.70 cm     LV e' lateral:   7.29 cm/s LVOT diam:     2.30 cm     LV E/e' lateral: 15.5 LV SV:         70 LV SV Index:   39 LVOT Area:     4.15 cm  LV Volumes (MOD) LV vol d, MOD A2C: 86.1 ml LV vol d, MOD A4C: 84.8 ml LV vol s, MOD A2C: 39.0 ml LV vol s, MOD A4C: 38.6 ml LV SV MOD A2C:     47.1 ml LV SV MOD A4C:     84.8 ml LV SV MOD BP:      46.5 ml  RIGHT VENTRICLE RV S prime:     9.25 cm/s TAPSE (M-mode): 1.3 cm  LEFT ATRIUM             Index        RIGHT ATRIUM          Index LA diam:        3.80 cm 2.10 cm/m   RA Area:     7.52 cm LA Vol (A2C):   27.8 ml 15.34 ml/m  RA Volume:   12.90 ml  7.12 ml/m LA Vol (A4C):   29.3 ml 16.17 ml/m LA Biplane Vol: 30.4 ml 16.78 ml/m AORTIC VALVE LVOT Vmax:   89.40 cm/s LVOT Vmean:  71.800 cm/s LVOT VTI:    0.168 m  AORTA Ao Root diam: 3.00 cm Ao Asc diam:  3.70 cm  MITRAL VALVE MV Area (PHT): 4.80 cm     SHUNTS MV Decel Time: 158 msec     Systemic VTI:  0.17 m MR Peak grad: 89.1 mmHg     Systemic Diam: 2.30 cm MR Vmax:      472.00 cm/s MV E velocity: 113.00 cm/s  Arvilla Meres MD Electronically signed by Arvilla Meres MD Signature Date/Time: 10/06/2022/11:19:14 AM    Final          ______________________________________________________________________________________________     Recent Labs: 10/04/2022: B Natriuretic Peptide 72.9 12/17/2022: Magnesium 1.3 04/11/2023: ALT 19 04/25/2023: BUN 28; Creatinine, Ser 0.97; Hemoglobin 11.0; Platelets 179; Potassium 3.8; Sodium 131 07/14/2023: TSH 0.91  Recent Lipid Panel    Component Value Date/Time   CHOL 178 08/08/2021 0134   TRIG 126 08/08/2021 0134   HDL 40 (L) 08/08/2021 0134   CHOLHDL 4.5 08/08/2021 0134   VLDL 25 08/08/2021 0134   LDLCALC 113 (H) 08/08/2021 0134    History of Present Illness    68 year old female with the above past medical history including paroxysmal atrial fibrillation, hypertension, type 2 diabetes, 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   She has a history of paroxysmal atrial fibrillation, previously on Eliquis, however, this was discontinued in the setting of no recurrence of atrial fibrillation.  She follows with pulmonology for a history of chronic pneumonitis secondary to rituximab use.  She was diagnosed with Crohn's disease  in 2024.  She was hospitalized in June 2024 in the setting of intractable nausea/vomiting, AKI, back pain, bilateral lower extremity edema.  Echocardiogram in 09/2022 showed EF 55 to 60%, normal LV function, no RWMA,  normal RV, mild mitral valve regurgitation. She was evaluated in the ED in 10/2022 for shortness of breath, palpitations.  EKG showed atrial fibrillation with RVR.  CT angio of the chest was negative for PE. She was noted to be mildly hypokalemic.  She converted to normal sinus rhythm and was discharged home in stable condition.  Eliquis was resumed.  She was last seen in the office on 01/18/2023 and was doing well from a cardiac standpoint.  She was cleared for knee surgery at the time.   She presents today for follow-up. Since her last visit she has been stable overall from a cardiac standpoint. 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 Her BP has been elevated in the setting.  Her PCP started her amlodipine, and increased her losartan to 100 mg daily.  BP remains elevated above goal. She notes rare fleeting palpitations, denies any significant breakthrough atrial fibrillation.  She denies chest pain, dyspnea, edema, PND, orthopnea, weight gain.  She has recovered extremely well from her knee surgery.  Other than her elevated blood pressure, she reports feeling well.   Home Medications    Current Outpatient Medications  Medication Sig Dispense Refill   amLODipine (NORVASC) 10 MG tablet Take 10 mg by mouth daily.     apixaban (ELIQUIS) 5 MG TABS tablet Take 1 tablet (5 mg total) by mouth 2 (two) times daily. Take 0.5 tablets (2.5 mg total) by mouth 2 (two) times daily for the first and second day after surgery.  Then on the third day after surgery, resume your normal dose of 1 tablet (5 mg total) by mouth 2 (two) times daily. (Patient taking differently: Take 5 mg by mouth 2 (two) times daily.)     BENADRYL ALLERGY 25 MG tablet Take 25 mg by mouth at bedtime.     cholecalciferol (VITAMIN D3) 25 MCG (1000 UNIT) tablet Take 1,000 Units by mouth daily.     Continuous Blood Gluc Sensor (FREESTYLE LIBRE 2 SENSOR)  MISC Inject 1 Device into the skin every 14 (fourteen) days.     CYLTEZO-CD/UC/HS STARTER 40 MG/0.8ML AJKT Inject 160 mLs into the skin every 14 (fourteen) days. Started 09/10/2022.     ferrous sulfate 325 (65 FE) MG tablet Take 325 mg by mouth daily before breakfast.     glucose blood (ACCU-CHEK GUIDE) test strip Use to test blood sugars up to 4 times daily as directed. 100 each 0   losartan (COZAAR) 100 MG tablet Take 100 mg by mouth daily.     Magnesium 400 MG CAPS Take 400 mg by mouth at bedtime.     Melatonin 10 MG TABS Take 10 mg by mouth at bedtime.     metFORMIN (GLUCOPHAGE) 500 MG tablet Take 500 mg by mouth 2 (two) times daily with a meal.     Multiple Vitamin (MULTIVITAMIN) tablet Take 1 tablet by mouth daily with breakfast.     omeprazole (PRILOSEC) 20 MG capsule Take 20 mg by mouth daily.     rosuvastatin (CRESTOR) 10 MG tablet Take 10 mg by mouth daily.     SACCHAROMYCES BOULARDII PO Take by mouth in the morning. 2 gummies  TURMERIC PO Take 1 capsule by mouth every morning.     venlafaxine XR (EFFEXOR-XR) 37.5 MG 24 hr capsule Take 37.5 mg by mouth daily with breakfast.     HUMALOG KWIKPEN 100 UNIT/ML KwikPen Inject 5 Units into the skin with breakfast, with lunch, and with evening meal. (Patient not taking: Reported on 07/18/2023) 15 mL 11   losartan (COZAAR) 50 MG tablet Take 1 tablet (50 mg total) by mouth daily. (Patient not taking: Reported on 07/18/2023) 30 tablet 0   traMADol (ULTRAM) 50 MG tablet Take 2 tablets (100 mg total) by mouth every 8 (eight) hours as needed for moderate pain. (Patient not taking: Reported on 07/18/2023) 20 tablet 0   No current facility-administered medications for this visit.     Review of Systems   She denies chest pain, dyspnea, pnd, orthopnea, n, v, dizziness, syncope, edema, weight gain, or early satiety. All other systems reviewed and are otherwise negative except as noted above.   Physical Exam    VS:  BP 128/68   Pulse 82   Ht 5\' 2"   (1.575 m)   Wt 172 lb 6.4 oz (78.2 kg)   SpO2 97%   BMI 31.53 kg/m  GEN: Well nourished, well developed, in no acute distress. HEENT: normal. Neck: Supple, no JVD, carotid bruits, or masses. Cardiac: RRR, no murmurs, rubs, or gallops. No clubbing, cyanosis, edema.  Radials/DP/PT 2+ and equal bilaterally.  Respiratory:  Respirations regular and unlabored, clear to auscultation bilaterally. GI: Soft, nontender, nondistended, BS + x 4. MS: no deformity or atrophy. Skin: warm and dry, no rash. Neuro:  Strength and sensation are intact. Psych: Normal affect.  Accessory Clinical Findings    ECG personally reviewed by me today - EKG Interpretation Date/Time:  Tuesday July 18 2023 10:52:08 EDT Ventricular Rate:  82 PR Interval:  220 QRS Duration:  90 QT Interval:  398 QTC Calculation: 464 R Axis:   28  Text Interpretation: Sinus rhythm with 1st degree A-V block When compared with ECG of 27-Jan-2023 08:52, PREVIOUS ECG IS PRESENT Confirmed by Bernadene Person ([id number]) on 07/18/2023 10:53:07 AM  - no acute changes.   Lab Results  Component Value Date   WBC 17.6 (H) 04/25/2023   HGB 11.0 (L) 04/25/2023   HCT 32.4 (L) 04/25/2023   MCV 86.9 04/25/2023   PLT 179 04/25/2023   Lab Results  Component Value Date   CREATININE 0.97 04/25/2023   BUN 28 (H) 04/25/2023   NA 131 (L) 04/25/2023   K 3.8 04/25/2023   CL 99 04/25/2023   CO2 21 (L) 04/25/2023   Lab Results  Component Value Date   ALT 19 04/11/2023   AST 19 04/11/2023   ALKPHOS 71 04/11/2023   BILITOT 0.4 04/11/2023   Lab Results  Component Value Date   CHOL 178 08/08/2021   HDL 40 (L) 08/08/2021   LDLCALC 113 (H) 08/08/2021   TRIG 126 08/08/2021   CHOLHDL 4.5 08/08/2021    Lab Results  Component Value Date   HGBA1C 6.1 (H) 04/11/2023    Assessment & Plan    1. Paroxysmal atrial fibrillation: Maintaining sinus rhythm.  She notes rare fleeting palpitations, denies any significant breakthrough atrial fibrillation.   Continue metoprolol, Eliquis.    2. Hypertension: BP has been elevated recently in the setting of personal stress.  Her PCP recently started her amlodipine, increased her losartan.  BP remains elevated above goal.  Will stop losartan and start olmesartan 40 mg daily.  Continue to  monitor BP and report BP consider to 130/80.  If BP remains elevated above goal, consider transitioning from metoprolol to carvedilol versus addition of chlorthalidone.    3. Type 2 diabetes: A1c was 6.1 in 03/2023.  Monitored and managed per PCP.   4. Chronic pneumonitis:  Following with pulmonology.    5. Follicular lymphoma/lung nodule: Follows with hematology/oncology.    6. Disposition: Follow-up in 2 months.       Joylene Grapes, NP 07/18/2023, 11:24 AM

## 2023-07-18 NOTE — Patient Instructions (Addendum)
 Medication Instructions:  Stop Losartan as directed by your provider Start olmestartan 40 mg daily Start Toprol XL 50 mg daily Start Amlodipine 10 mg daily  Continue current medications *If you need a refill on your cardiac medications before your next appointment, please call your pharmacy*   Lab Work: none If you have labs (blood work) drawn today and your tests are completely normal, you will receive your results only by: MyChart Message (if you have MyChart) OR A paper copy in the mail If you have any lab test that is abnormal or we need to change your treatment, we will call you to review the results.   Testing/Procedures: none   Follow-Up: At [facility], you and your health needs are our priority.  As part of our continuing mission to provide you with exceptional heart care, we have created designated Provider Care Teams.  These Care Teams include your primary Cardiologist (physician) and Advanced Practice Providers (APPs -  Physician Assistants and Nurse Practitioners) who all work together to provide you with the care you need, when you need it.  We recommend signing up for the patient portal called "MyChart".  Sign up information is provided on this After Visit Summary.  MyChart is used to connect with patients for Virtual Visits (Telemedicine).  Patients are able to view lab/test results, encounter notes, upcoming appointments, etc.  Non-urgent messages can be sent to your provider as well.   To learn more about what you can do with MyChart, go to [email or web address].    Your next appointment:   2 month(s)  Provider:   Bernadene Person, NP  Other Instructions Please check blood pressure daily and report blood pressure 130/ 80 or greater

## 2023-07-19 ENCOUNTER — Encounter: Payer: Self-pay | Admitting: Pulmonary Disease

## 2023-07-19 ENCOUNTER — Ambulatory Visit (INDEPENDENT_AMBULATORY_CARE_PROVIDER_SITE_OTHER): Admitting: Pulmonary Disease

## 2023-07-19 ENCOUNTER — Encounter: Payer: Self-pay | Admitting: Nurse Practitioner

## 2023-07-19 VITALS — BP 122/82 | HR 85 | Ht 62.0 in | Wt 173.0 lb

## 2023-07-19 DIAGNOSIS — R918 Other nonspecific abnormal finding of lung field: Secondary | ICD-10-CM | POA: Diagnosis not present

## 2023-07-19 NOTE — Patient Instructions (Addendum)
 Your lung nodules are stable, we will continue to monitor these scans with Dr. Candise Che  Follow up in 1 year, call sooner if needed

## 2023-07-19 NOTE — Progress Notes (Unsigned)
 Synopsis: Referred in April 2023 for abnormal CT chest findings by Johney Maine, MD  Subjective:   PATIENT ID: Meghan Alexander GENDER: female DOB: 1956-03-03, MRN: [id number]  HPI  No chief complaint on file.  Meghan Alexander is a 68 year old woman, never smoker with allergies, achalasia, GERD, hypertension, DM II and follicular lymphoma who returns to pulmonary clinic for pneumonitis.   She was diagnosed with chron's colitis, she was started on high dose steroids last month for the colitis and recently started cyltezo. She reports developing thrush over recent weeks. She has been doing salt water rinses with some relief. She denies any issues with her respiratory status at this time.   OV 02/14/22 She was re-started on prednisone taper due to her increase in symptoms after completion of the prior steroid taper. She is feeling better with less dyspnea.  ONO results did not indicate a need for nocturnal oxygen.   She is having a knee replacement surgery in the near future.   OV 12/23/21 She reports increasing shortness of breath since completion of steroid taper on 11/08/21. She reports her O2 levels dropping into the 80s based on her home pulse oximeter. She has dyspnea with brushing her teeth or talking on the phone.   OV 11/05/21 Autoimmune lab panel was unremarkable at last visit. She has been tapered down to 10mg  of prednisone daily. Her breathing has significantly improved. She is doing well since her right ankle surgery.   OV 09/24/21 She was started on 40mg  prednisone daily on 4/19 which was reduced to 20mg  daily on 5/2 prior to her ankle surgery on 09/03/21. She was admitted 5/15 to 5/17 for acute hypoxemic respiratory failure. She had an increase in bilateral opacities on CTA Chest scan 5/15. She was seen by inpatient pulmonary team with concern for peri-operative aspiration vs acute infiltrates from covid 19 infection on chronic interstitial findings. She was discharged on home  O2 as she desatted with ambulation.  She has not been using oxygen over the past couple of weeks. She resumed 40mg  of prednisone daily and is taking bactrim 3 days per week. She denies cough, joint pains or fevers. She continues to have night sweats. She reports her achalasia symptoms have significantly improved since taking steroids.   Initial OV 08/11/21 She was admitted 4/10 to 4/17 for acute respiratory failure with diffuse ground glass opacities noted on her CT Chest scan. She had progressive cough, night sweats, chills and dyspnea. She completed 3 courses of outpatient antibiotics without improvement. She underwent bronchoscopy with BAL of the RML and transbronchial brushings of the RLL on 4/11. BAL cell count was predominantly lymphocytic. Pathology did not indicate malignant cells on brushings. There was report of concern for actinomyces on path report. BAL culture grew strep mitis. Fungtitell and quantiferon gold are negative. ID was consulted. Patient was treated with ceftriaxone and azithromycin and placed on augmentin for 14 day course and treated with high dose IV steroids and discharged on 5 days of prednisone 40mg  daily. ESR was 75 on 08/02/21  She reports noticing improvement in her breathing after receiving the IV steroids. She did not require supplemental oxygen at time of discharge.   Upon chart review, she received her rituximab infusion on 02/25/21 and was seen again in Oncology clinic 05/11/21 where it was noted she had bronchitis and treated for pneumonia since the infusion. There is no chest imaging for review from late 2022 or early 2023.   Past Medical History:  Diagnosis  Date   A-fib [facility])    Achalasia    Allergy    year around allergies   Arthritis    fingers, knees, neck, back-osteoarthristis   Bronchitis    hx of   Complication of anesthesia    Crohn's disease (HCC)    Dysrhythmia    a. 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)  04/02/2019   GERD (gastroesophageal reflux disease)    hx of reflux-went away with elevating HOB    Hypertension    Pneumonia    hx of    PONV (postoperative nausea and vomiting)    Thyroid nodule    mulitple nodules that are being monitored   Type 2 diabetes mellitus (HCC) 08/02/2021     Family History  Problem Relation Age of Onset   Lung cancer Father        1982 died from it   Hernia Mother    Breast cancer Maternal Aunt        multiple    Breast cancer Maternal Grandmother    Breast cancer Paternal Grandmother    Breast cancer Cousin        cousins multiple    Colon cancer Neg Hx    Colon polyps Neg Hx    Rectal cancer Neg Hx    Stomach cancer Neg Hx      Social History   Socioeconomic History   Marital status: Married    Spouse name: Not on file   Number of children: Not on file   Years of education: Not on file   Highest education level: Not on file  Occupational History   Not on file  Tobacco Use   Smoking status: Never   Smokeless tobacco: Never  Vaping Use   Vaping status: Never Used  Substance and Sexual Activity   Alcohol use: No    Alcohol/week: 0.0 standard drinks of alcohol   Drug use: No   Sexual activity: Yes  Other Topics Concern   Not on file  Social History Narrative   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, newspaper at [employer]   Never smoker never drinker   No drugs   Multiple allergies   Lives at home with her husband of 8 years since 2008   Social Drivers of Health   Financial Resource Strain: Not on file  Food Insecurity: No Food Insecurity (04/24/2023)   Hunger Vital Sign    Worried About Running Out of Food in the Last Year: Never true    Ran Out of Food in the Last Year: Never true  Transportation Needs: No Transportation Needs (04/24/2023)   PRAPARE - Administrator, Civil Service (Medical): No    Lack of Transportation (Non-Medical): No  Physical Activity: Not on file  Stress: Not on file   Social Connections: Socially Isolated (04/24/2023)   Social Connection and Isolation Panel [NHANES]    Frequency of Communication with Friends and Family: Once a week    Frequency of Social Gatherings with Friends and Family: Never    Attends Religious Services: Never    Database administrator or Organizations: No    Attends Banker Meetings: Never    Marital Status: Married  Catering manager Violence: Not At Risk (04/24/2023)   Humiliation, Afraid, Rape, and Kick questionnaire    Fear of Current or Ex-Partner: No    Emotionally Abused: No    Physically Abused: No  Sexually Abused: No     Allergies  Allergen Reactions   Latex Other (See Comments)    Blisters / skin peeling   Atenolol Cough   Other Itching, Swelling and Other (See Comments)    DermaPlast or ANY SURGICAL GLUE Used after surgery - caused swelling, itching, and wound broke open     Outpatient Medications Prior to Visit  Medication Sig Dispense Refill   amLODipine (NORVASC) 10 MG tablet Take 1 tablet (10 mg total) by mouth daily. 90 tablet 3   apixaban (ELIQUIS) 5 MG TABS tablet Take 1 tablet (5 mg total) by mouth 2 (two) times daily. Take 0.5 tablets (2.5 mg total) by mouth 2 (two) times daily for the first and second day after surgery.  Then on the third day after surgery, resume your normal dose of 1 tablet (5 mg total) by mouth 2 (two) times daily. (Patient taking differently: Take 5 mg by mouth 2 (two) times daily.)     BENADRYL ALLERGY 25 MG tablet Take 25 mg by mouth at bedtime.     cholecalciferol (VITAMIN D3) 25 MCG (1000 UNIT) tablet Take 1,000 Units by mouth daily.     Continuous Blood Gluc Sensor (FREESTYLE LIBRE 2 SENSOR) MISC Inject 1 Device into the skin every 14 (fourteen) days.     CYLTEZO-CD/UC/HS STARTER 40 MG/0.8ML AJKT Inject 160 mLs into the skin every 14 (fourteen) days. Started 09/10/2022.     ferrous sulfate 325 (65 FE) MG tablet Take 325 mg by mouth daily before breakfast.      glucose blood (ACCU-CHEK GUIDE) test strip Use to test blood sugars up to 4 times daily as directed. 100 each 0   HUMALOG KWIKPEN 100 UNIT/ML KwikPen Inject 5 Units into the skin with breakfast, with lunch, and with evening meal. (Patient not taking: Reported on 07/18/2023) 15 mL 11   Magnesium 400 MG CAPS Take 400 mg by mouth at bedtime.     Melatonin 10 MG TABS Take 10 mg by mouth at bedtime.     metFORMIN (GLUCOPHAGE) 500 MG tablet Take 500 mg by mouth 2 (two) times daily with a meal.     metoprolol succinate (TOPROL XL) 50 MG 24 hr tablet Take 1 tablet (50 mg total) by mouth daily. Take with or immediately following a meal. 90 tablet 3   Multiple Vitamin (MULTIVITAMIN) tablet Take 1 tablet by mouth daily with breakfast.     olmesartan (BENICAR) 40 MG tablet Take 1 tablet (40 mg total) by mouth daily. 90 tablet 3   omeprazole (PRILOSEC) 20 MG capsule Take 20 mg by mouth daily.     rosuvastatin (CRESTOR) 10 MG tablet Take 10 mg by mouth daily.     SACCHAROMYCES BOULARDII PO Take by mouth in the morning. 2 gummies     traMADol (ULTRAM) 50 MG tablet Take 2 tablets (100 mg total) by mouth every 8 (eight) hours as needed for moderate pain. (Patient not taking: Reported on 07/18/2023) 20 tablet 0   TURMERIC PO Take 1 capsule by mouth every morning.     venlafaxine XR (EFFEXOR-XR) 37.5 MG 24 hr capsule Take 37.5 mg by mouth daily with breakfast.     No facility-administered medications prior to visit.   Review of Systems  Constitutional:  Negative for chills, fever, malaise/fatigue and weight loss.  HENT:  Negative for congestion, sinus pain and sore throat.   Eyes: Negative.   Respiratory:  Negative for cough, hemoptysis, sputum production, shortness of breath and wheezing.  Cardiovascular:  Negative for chest pain, palpitations, orthopnea, claudication and leg swelling.  Gastrointestinal:  Negative for abdominal pain, heartburn, nausea and vomiting.  Genitourinary: Negative.   Musculoskeletal:   Negative for joint pain and myalgias.  Skin:  Negative for rash.  Neurological:  Negative for weakness.  Endo/Heme/Allergies: Negative.   Psychiatric/Behavioral: Negative.      Objective:   There were no vitals filed for this visit.  Physical Exam Constitutional:      General: She is not in acute distress.    Appearance: She is not ill-appearing.  HENT:     Head: Normocephalic and atraumatic.  Cardiovascular:     Rate and Rhythm: Normal rate and regular rhythm.     Pulses: Normal pulses.     Heart sounds: Normal heart sounds. No murmur heard. Pulmonary:     Effort: Pulmonary effort is normal.     Breath sounds: Normal breath sounds. Decreased air movement present. No wheezing, rhonchi or rales.  Musculoskeletal:     Right lower leg: No edema.     Left lower leg: No edema.  Skin:    General: Skin is warm and dry.  Neurological:     General: No focal deficit present.     Mental Status: She is alert.    CBC    Component Value Date/Time   WBC 17.6 (H) 04/25/2023 0326   RBC 3.73 (L) 04/25/2023 0326   HGB 11.0 (L) 04/25/2023 0326   HGB 13.8 03/27/2023 1151   HGB 12.0 12/09/2021 1339   HCT 32.4 (L) 04/25/2023 0326   HCT 37.1 12/09/2021 1339   PLT 179 04/25/2023 0326   PLT 238 03/27/2023 1151   PLT 325 12/09/2021 1339   MCV 86.9 04/25/2023 0326   MCV 81 12/09/2021 1339   MCH 29.5 04/25/2023 0326   MCHC 34.0 04/25/2023 0326   RDW 13.3 04/25/2023 0326   RDW 16.7 (H) 12/09/2021 1339   LYMPHSABS 1.7 04/11/2023 1216   MONOABS 0.8 04/11/2023 1216   EOSABS 0.3 04/11/2023 1216   BASOSABS 0.1 04/11/2023 1216      Latest Ref Rng & Units 04/25/2023    3:26 AM 04/11/2023   12:16 PM 03/27/2023   11:51 AM  BMP  Glucose 70 - 99 mg/dL 409  811  914   BUN 8 - 23 mg/dL 28  25  22    Creatinine 0.44 - 1.00 mg/dL 7.82  9.56  2.13   Sodium 135 - 145 mmol/L 131  139  142   Potassium 3.5 - 5.1 mmol/L 3.8  3.8  4.1   Chloride 98 - 111 mmol/L 99  102  101   CO2 22 - 32 mmol/L 21   27  32   Calcium 8.9 - 10.3 mg/dL 8.4  9.1  08.6    Chest imaging: HRCT Chest 01/25/22 1. No evidence of fibrotic interstitial lung disease. Minimal bland appearing scarring and or atelectasis of the posterior lingula and left lower lobe unchanged compared to prior examinations benign sequelae of prior infection or inflammation. 2. Previously seen acute airspace disease is resolved. 3. Lobular air trapping on expiratory phase imaging, suggestive of small airways disease. 4. 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 5. No evidence of lymphadenopathy in the chest. 6. Coronary artery disease.  CXR 12/23/21 Interval improvement in right-greater-than-left airspace opacities.  CTA Chest 10/08/21 1. No evidence of acute pulmonary embolism or other acute vascular  findings in the chest. 2. Recurrent/fluctuating bilateral ground-glass and airspace opacities, overall improved compared with prior CT of 1 month ago, but worsened from radiographs of 2 weeks ago. Differential considerations include atypical infection, non infectious inflammation, drug reaction and atypical edema. Given the fluctuation, lymphomatous involvement less likely. Radiographic follow up recommended. 3. No adenopathy or pleural effusion. 4. Dilated esophagus with air-fluid levels consistent with achalasia.  CTA Chest 09/06/21 Mediastinum/Nodes: No enlarged mediastinal, hilar, or axillary lymph nodes. Thyroid gland, and trachea demonstrate no significant findings. As before, again seen is the moderately large fluid-filled dilatation of the esophagus consistent with history of achalasia.   Lungs/Pleura: There are confluent patchy opacities seen throughout the lungs and are more prominent in the upper lobes and have significantly progressed in the interim. Mild  bibasilar pleural thickening and was present on the previous study as well.  CXR 09/06/21 Patchy ill-defined nodular appearing areas are noted throughout the lungs bilaterally. In addition, there is more diffuse airspace consolidation throughout the left mid to lower lung. No definite pleural effusions. No pneumothorax. No evidence of pulmonary edema. Heart size is normal. Upper mediastinal contours are within normal limits. Atherosclerotic calcifications are noted in the thoracic aorta.  CT 08/02/21 No definite evidence of pulmonary embolus. Continued presence patchy airspace opacities throughout both lungs which may be slightly more prominent compared to prior exam, concerning for multifocal pneumonia. Hepatic steatosis. Stable dilated esophagus is noted consistent with history of achalasia. Aortic Atherosclerosis  CT 07/14/21 Mediastinum/Nodes: No discrete thyroid nodule. No pathologically enlarged mediastinal, hilar or axillary lymph nodes. Patulous thoracic esophagus with retained versus refluxed contrast material in the esophagus and mild diffuse esophageal wall thickening.   Lungs/Pleura: Extensive multifocal bilateral ground-glass opacities with interstitial thickening, favored to reflect an infectious or inflammatory etiology. No pleural effusion. No pneumothorax.  PFT:     No data to display          Labs:  Path:  Echo:  Heart Catheterization:    Assessment & Plan:   No diagnosis found.  Discussion: Meghan Alexander is a 68 year old woman, never smoker with allergies, achalasia, GERD, hypertension, DM II and follicular lymphoma who returns to pulmonary clinic for pneumonitis.   Chest imaging from last fall shows resolution of bilateral ground glass opacities. She s asymptomatic from a respiratory standpoint at this time.  She is completing steroid taper for her recent chron's colotis diagnosis.   She is to take 100mg  fluconazole daily for 5 days for  thrush. She can continue nasal sprays for sinus drainage.  Follow up as needed.  Melody Comas, MD [facility] Office: [phone]   Current Outpatient Medications:    amLODipine (NORVASC) 10 MG tablet, Take 1 tablet (10 mg total) by mouth daily., Disp: 90 tablet, Rfl: 3   apixaban (ELIQUIS) 5 MG TABS tablet, Take 1 tablet (5 mg total) by mouth 2 (two) times daily. Take 0.5 tablets (2.5 mg total) by mouth 2 (two) times daily for the first and second day after surgery.  Then on the third day after surgery, resume your normal dose of 1 tablet (5 mg total) by mouth 2 (two) times daily. (Patient taking differently: Take 5 mg by mouth 2 (two) times daily.), Disp: , Rfl:    BENADRYL ALLERGY 25 MG tablet, Take 25 mg by mouth at bedtime., Disp: , Rfl:    cholecalciferol (VITAMIN D3) 25 MCG (1000 UNIT) tablet, Take 1,000 Units by mouth daily., Disp: , Rfl:  Continuous Blood Gluc Sensor (FREESTYLE LIBRE 2 SENSOR) MISC, Inject 1 Device into the skin every 14 (fourteen) days., Disp: , Rfl:    CYLTEZO-CD/UC/HS STARTER 40 MG/0.8ML AJKT, Inject 160 mLs into the skin every 14 (fourteen) days. Started 09/10/2022., Disp: , Rfl:    ferrous sulfate 325 (65 FE) MG tablet, Take 325 mg by mouth daily before breakfast., Disp: , Rfl:    glucose blood (ACCU-CHEK GUIDE) test strip, Use to test blood sugars up to 4 times daily as directed., Disp: 100 each, Rfl: 0   HUMALOG KWIKPEN 100 UNIT/ML KwikPen, Inject 5 Units into the skin with breakfast, with lunch, and with evening meal. (Patient not taking: Reported on 07/18/2023), Disp: 15 mL, Rfl: 11   Magnesium 400 MG CAPS, Take 400 mg by mouth at bedtime., Disp: , Rfl:    Melatonin 10 MG TABS, Take 10 mg by mouth at bedtime., Disp: , Rfl:    metFORMIN (GLUCOPHAGE) 500 MG tablet, Take 500 mg by mouth 2 (two) times daily with a meal., Disp: , Rfl:    metoprolol succinate (TOPROL XL) 50 MG 24 hr tablet, Take 1 tablet (50 mg total) by mouth daily. Take  with or immediately following a meal., Disp: 90 tablet, Rfl: 3   Multiple Vitamin (MULTIVITAMIN) tablet, Take 1 tablet by mouth daily with breakfast., Disp: , Rfl:    olmesartan (BENICAR) 40 MG tablet, Take 1 tablet (40 mg total) by mouth daily., Disp: 90 tablet, Rfl: 3   omeprazole (PRILOSEC) 20 MG capsule, Take 20 mg by mouth daily., Disp: , Rfl:    rosuvastatin (CRESTOR) 10 MG tablet, Take 10 mg by mouth daily., Disp: , Rfl:    SACCHAROMYCES BOULARDII PO, Take by mouth in the morning. 2 gummies, Disp: , Rfl:    traMADol (ULTRAM) 50 MG tablet, Take 2 tablets (100 mg total) by mouth every 8 (eight) hours as needed for moderate pain. (Patient not taking: Reported on 07/18/2023), Disp: 20 tablet, Rfl: 0   TURMERIC PO, Take 1 capsule by mouth every morning., Disp: , Rfl:    venlafaxine XR (EFFEXOR-XR) 37.5 MG 24 hr capsule, Take 37.5 mg by mouth daily with breakfast., Disp: , Rfl:

## 2023-07-20 ENCOUNTER — Encounter: Payer: Self-pay | Admitting: Pulmonary Disease

## 2023-08-01 ENCOUNTER — Telehealth: Payer: Self-pay | Admitting: Internal Medicine

## 2023-08-01 MED ORDER — CARVEDILOL 6.25 MG PO TABS: 6.2500 mg | ORAL_TABLET | Freq: Two times a day (BID) | ORAL | 3 refills | Status: DC

## 2023-08-01 NOTE — Telephone Encounter (Signed)
 Pt c/o BP issue: STAT if pt c/o blurred vision, one-sided weakness or slurred speech.  STAT if BP is GREATER than 180/120 TODAY.  STAT if BP is LESS than 90/60 and SYMPTOMATIC TODAY  1. What is your BP concern?   2. Have you taken any BP medication today?  Yes  3. What are your last 5 BP readings?  142/87 (this morning)  4. Are you having any other symptoms (ex. Dizziness, headache, blurred vision, passed out)?   Headache, lethargic   Patient wants a call back from E. Monge and stated her BP has not stabilized and is usually in the 140/90 range and sometimes goes higher

## 2023-08-01 NOTE — Telephone Encounter (Signed)
 Patient identification verified by 2 forms. Marilynn Rail, RN    Called and spoke to patient  Patient states:   -she is concerned about her BP   -was advised by NP Monge to outreach office if BP is >140/90   -she is unable to have BP decrease to less than 140/90   -4/8 142/87  -has a slight headache constantly   -feels lethargic   -Takes: Amlodipine 10 mg daily, Metoprolol 50 mg BID, Olmesartan 40 mg daily  Patient denies:   -chest pressure/chest pain   -New worsening/SOB   -visual changes/disturbances   -New numbness/tingling  Advised patient to keep BP log  Informed patient message sent to NP Monge for input  Patient verbalized understanding, no questions at this time

## 2023-08-01 NOTE — Telephone Encounter (Signed)
 Meghan Grapes, NP  You13 minutes ago (10:55 AM)    Would recommend stopping metoprolol and starting carvedilol 6.25 mg twice daily.  Continue to monitor BP, HR.  Report BP consistently greater than 140/80, HR consistently < 50 bpm.  Otherwise, continue current medications and follow-up as planned.  Thank you-EM   Patient identification verified by 2 forms. Meghan Rail, RN    Called and spoke to patient  Relayed provider message  Patient aware to stop Metoprolol and start carvedilol 6.25 mg  Advised patient to keep BP long with date and BP information  Patient agrees with plan, no questions at this time

## 2023-09-07 ENCOUNTER — Ambulatory Visit: Attending: Nurse Practitioner | Admitting: Nurse Practitioner

## 2023-09-07 ENCOUNTER — Encounter: Payer: Self-pay | Admitting: Nurse Practitioner

## 2023-09-07 VITALS — BP 128/80 | HR 93 | Ht 62.0 in | Wt 176.0 lb

## 2023-09-07 DIAGNOSIS — I10 Essential (primary) hypertension: Secondary | ICD-10-CM

## 2023-09-07 DIAGNOSIS — I340 Nonrheumatic mitral (valve) insufficiency: Secondary | ICD-10-CM | POA: Diagnosis not present

## 2023-09-07 DIAGNOSIS — R600 Localized edema: Secondary | ICD-10-CM

## 2023-09-07 DIAGNOSIS — E1165 Type 2 diabetes mellitus with hyperglycemia: Secondary | ICD-10-CM

## 2023-09-07 DIAGNOSIS — C8218 Follicular lymphoma grade II, lymph nodes of multiple sites: Secondary | ICD-10-CM

## 2023-09-07 DIAGNOSIS — I480 Paroxysmal atrial fibrillation: Secondary | ICD-10-CM | POA: Diagnosis not present

## 2023-09-07 DIAGNOSIS — R911 Solitary pulmonary nodule: Secondary | ICD-10-CM

## 2023-09-07 DIAGNOSIS — J984 Other disorders of lung: Secondary | ICD-10-CM

## 2023-09-07 MED ORDER — CARVEDILOL 12.5 MG PO TABS: 12.5000 mg | ORAL_TABLET | Freq: Two times a day (BID) | ORAL | 3 refills | Status: DC

## 2023-09-07 MED ORDER — AMLODIPINE BESYLATE 5 MG PO TABS: 5.0000 mg | ORAL_TABLET | Freq: Every day | ORAL | 3 refills | Status: AC

## 2023-09-07 NOTE — Progress Notes (Signed)
 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  Past Medical History    Past Medical History:  Diagnosis Date   A-fib [facility])    Achalasia    Allergy    year around allergies   Arthritis    fingers, knees, neck, back-osteoarthristis   Bronchitis    hx of   Complication of anesthesia    Crohn's disease (HCC)    Dysrhythmia    a. 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   GERD (gastroesophageal reflux disease)    hx of reflux-went away with elevating HOB    Hypertension    Pneumonia    hx of    PONV (postoperative nausea and vomiting)    Thyroid  nodule    mulitple nodules that are being monitored   Type 2 diabetes mellitus (HCC) 08/02/2021   Past Surgical History:  Procedure Laterality Date   ABDOMINAL HYSTERECTOMY     ADENOIDECTOMY     ANKLE SURGERY Right    BIOPSY  03/10/2022   Procedure: BIOPSY;  Surgeon: Ozell Blunt, MD;  Location: [facility] ENDOSCOPY;  Service: Gastroenterology;;   BRONCHIAL BRUSHINGS  08/03/2021   Procedure: BRONCHIAL BRUSHINGS;  Surgeon: Denson Flake, MD;  Location: [facility] ENDOSCOPY;  Service: Cardiopulmonary;;   BRONCHIAL WASHINGS  08/03/2021   Procedure: BRONCHIAL WASHINGS;  Surgeon: Denson Flake, MD;  Location: [facility] ENDOSCOPY;  Service: Cardiopulmonary;;   cosmetic surgeries     brow lift and lipsuction on abdominal, breast implants   ESOPHAGEAL MANOMETRY N/A 12/02/2015   Procedure: ESOPHAGEAL MANOMETRY (EM);  Surgeon: Evangeline Hilts, MD;  Location: [facility] ENDOSCOPY;   Service: Endoscopy;  Laterality: N/A;   ESOPHAGOGASTRODUODENOSCOPY N/A 12/02/2015   Procedure: ESOPHAGOGASTRODUODENOSCOPY (EGD);  Surgeon: Evangeline Hilts, MD;  Location: [facility] ENDOSCOPY;  Service: Endoscopy;  Laterality: N/A;   FLEXIBLE SIGMOIDOSCOPY N/A 03/10/2022   Procedure: FLEXIBLE SIGMOIDOSCOPY;  Surgeon: Ozell Blunt, MD;  Location: [facility] ENDOSCOPY;  Service: Gastroenterology;  Laterality: N/A;   IR KYPHO THORACIC WITH BONE BIOPSY  11/22/2022   IR RADIOLOGIST EVAL & MGMT  11/08/2022   IR RADIOLOGIST EVAL & MGMT  12/19/2022   IR VERTEBROPLASTY CERV/THOR BX INC UNI/BIL INC/INJECT/IMAGING  10/05/2022   lymph node removal left leg     cat scratch surgery    ORIF ANKLE FRACTURE Right 09/03/2021   Procedure: OPEN REDUCTION INTERNAL FIXATION (ORIF) ANKLE FRACTURE;  Surgeon: Murleen Arms, MD;  Location: [facility] OR;  Service: Orthopedics;  Laterality: Right;   THROAT SURGERY     UPPP for sleep apnea   TONSILLECTOMY     TOTAL KNEE ARTHROPLASTY Right 04/24/2023   Procedure: TOTAL KNEE ARTHROPLASTY;  Surgeon: Murleen Arms, MD;  Location: [facility] ORS;  Service: Orthopedics;  Laterality: Right;   VIDEO BRONCHOSCOPY  08/03/2021   Procedure: VIDEO BRONCHOSCOPY WITH FLUORO;  Surgeon: Denson Flake, MD;  Location: [facility] ENDOSCOPY;  Service: Cardiopulmonary;;    Allergies  Allergies  Allergen Reactions   Latex Other (See Comments)    Blisters / skin peeling   Atenolol  Cough   Other Itching, Swelling and Other (See Comments)  DermaPlast or ANY SURGICAL GLUE Used after surgery - caused swelling, itching, and wound broke open     Labs/Other Studies Reviewed    The following studies were reviewed today:  Cardiac Studies & Procedures   ______________________________________________________________________________________________     ECHOCARDIOGRAM  ECHOCARDIOGRAM COMPLETE 10/06/2022  Narrative ECHOCARDIOGRAM REPORT    Patient Name:   Meghan Alexander Date of Exam: 10/06/2022 Medical  Rec #:  [id number]      Height:       62.0 in Accession #:    [id number]     Weight:       176.4 lb Date of Birth:  17-Oct-1955       BSA:          1.812 m Patient Age:    67 years       BP:           162/96 mmHg Patient Gender: F              HR:           101 bpm. Exam Location:  Inpatient  Procedure: 2D Echo, Cardiac Doppler, Color Doppler and Intracardiac Opacification Agent  Indications:    Edema  History:        Patient has prior history of Echocardiogram examinations, most recent 10/08/2021. Arrythmias:Atrial Fibrillation; Risk Factors:Diabetes and Hypertension. ILD, lymphoma.  Sonographer:    Jasmine Mesi Referring Phys: [id number] ALLISON L ELLIS   Sonographer Comments: Image acquisition challenging due to patient body habitus and Image acquisition challenging due to respiratory motion. IMPRESSIONS   1. Left ventricular ejection fraction, by estimation, is 55 to 60%. The left ventricle has normal function. The left ventricle has no regional wall motion abnormalities. Indeterminate diastolic filling due to E-A fusion. 2. Right ventricular systolic function is normal. The right ventricular size is normal. 3. The mitral valve is normal in structure. Mild mitral valve regurgitation. No evidence of mitral stenosis. 4. The aortic valve is normal in structure. Aortic valve regurgitation is not visualized. No aortic stenosis is present. 5. The inferior vena cava is normal in size with greater than 50% respiratory variability, suggesting right atrial pressure of 3 mmHg.  FINDINGS Left Ventricle: Left ventricular ejection fraction, by estimation, is 55 to 60%. The left ventricle has normal function. The left ventricle has no regional wall motion abnormalities. The left ventricular internal cavity size was normal in size. There is no left ventricular hypertrophy. Indeterminate diastolic filling due to E-A fusion.  Right Ventricle: The right ventricular size is normal. No increase in right  ventricular wall thickness. Right ventricular systolic function is normal.  Left Atrium: Left atrial size was normal in size.  Right Atrium: Right atrial size was normal in size.  Pericardium: There is no evidence of pericardial effusion.  Mitral Valve: The mitral valve is normal in structure. There is mild calcification of the mitral valve leaflet(s). Mild mitral valve regurgitation. No evidence of mitral valve stenosis.  Tricuspid Valve: The tricuspid valve is normal in structure. Tricuspid valve regurgitation is not demonstrated. No evidence of tricuspid stenosis.  Aortic Valve: The aortic valve is normal in structure. Aortic valve regurgitation is not visualized. No aortic stenosis is present.  Pulmonic Valve: The pulmonic valve was not well visualized. Pulmonic valve regurgitation is not visualized. No evidence of pulmonic stenosis.  Aorta: The aortic root is normal in size and structure.  Venous: The inferior vena cava is normal in size with greater than 50% respiratory variability, suggesting right atrial pressure of  3 mmHg.  IAS/Shunts: No atrial level shunt detected by color flow Doppler.   LEFT VENTRICLE PLAX 2D LVIDd:         4.10 cm     Diastology LVIDs:         3.20 cm     LV e' medial:    5.11 cm/s LV PW:         0.90 cm     LV E/e' medial:  22.1 LV IVS:        0.70 cm     LV e' lateral:   7.29 cm/s LVOT diam:     2.30 cm     LV E/e' lateral: 15.5 LV SV:         70 LV SV Index:   39 LVOT Area:     4.15 cm  LV Volumes (MOD) LV vol d, MOD A2C: 86.1 ml LV vol d, MOD A4C: 84.8 ml LV vol s, MOD A2C: 39.0 ml LV vol s, MOD A4C: 38.6 ml LV SV MOD A2C:     47.1 ml LV SV MOD A4C:     84.8 ml LV SV MOD BP:      46.5 ml  RIGHT VENTRICLE RV S prime:     9.25 cm/s TAPSE (M-mode): 1.3 cm  LEFT ATRIUM             Index        RIGHT ATRIUM          Index LA diam:        3.80 cm 2.10 cm/m   RA Area:     7.52 cm LA Vol (A2C):   27.8 ml 15.34 ml/m  RA Volume:   12.90 ml  7.12 ml/m LA Vol (A4C):   29.3 ml 16.17 ml/m LA Biplane Vol: 30.4 ml 16.78 ml/m AORTIC VALVE LVOT Vmax:   89.40 cm/s LVOT Vmean:  71.800 cm/s LVOT VTI:    0.168 m  AORTA Ao Root diam: 3.00 cm Ao Asc diam:  3.70 cm  MITRAL VALVE MV Area (PHT): 4.80 cm     SHUNTS MV Decel Time: 158 msec     Systemic VTI:  0.17 m MR Peak grad: 89.1 mmHg     Systemic Diam: 2.30 cm MR Vmax:      472.00 cm/s MV E velocity: 113.00 cm/s  Jules Oar MD Electronically signed by Jules Oar MD Signature Date/Time: 10/06/2022/11:19:14 AM    Final          ______________________________________________________________________________________________     Recent Labs: 10/04/2022: B Natriuretic Peptide 72.9 12/17/2022: Magnesium  1.3 04/11/2023: ALT 19 04/25/2023: BUN 28; Creatinine, Ser 0.97; Hemoglobin 11.0; Platelets 179; Potassium 3.8; Sodium 131 07/14/2023: TSH 0.91  Recent Lipid Panel    Component Value Date/Time   CHOL 178 08/08/2021 0134   TRIG 126 08/08/2021 0134   HDL 40 (L) 08/08/2021 0134   CHOLHDL 4.5 08/08/2021 0134   VLDL 25 08/08/2021 0134   LDLCALC 113 (H) 08/08/2021 0134    History of Present Illness    68 year old female with the above past medical history including paroxysmal atrial fibrillation, hypertension, type 2 diabetes, 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   She has a history of paroxysmal atrial fibrillation, previously on Eliquis , however, this was discontinued in the setting of no recurrence of atrial fibrillation.  She follows with pulmonology for a history of chronic pneumonitis secondary to rituximab  use.  She was diagnosed with Crohn's disease  in 2024.  She was hospitalized in June 2024 in the setting of intractable nausea/vomiting, AKI, back pain, bilateral lower extremity edema.  Echocardiogram in 09/2022 showed EF 55 to 60%, normal LV function, no RWMA,  normal RV, mild mitral valve regurgitation. She was evaluated in the ED in 10/2022 for shortness of breath, palpitations.  EKG showed atrial fibrillation with RVR.  CT angio of the chest was negative for PE. She was noted to be mildly hypokalemic.  She converted to normal sinus rhythm and was discharged home in stable condition.  Eliquis  was resumed.  She was last seen in the office on 07/18/2023 and was from a cardiac standpoint.  She did note increasingly elevated BP medication changes per PCP.  She was transitioned from losartan  to olmesartan  40 mg daily.  She contacted our office on 08/01/2023 with concern for ongoing elevated BP.  She was transitioned from metoprolol  to carvedilol .   She presents today for follow-up. Since her last visit she has been stable from a cardiac standpoint.  BP has been well-controlled.  Unfortunately, over the past 2 weeks she has noticed increased dependent bilateral lower extremity edema. This has coincided with initiation of amlodipine .  She denies any chest pain, dyspnea, PND, orthopnea, weight gain.  She will note occasional fleeting palpitations with associated breathlessness, this happens approximately 1 time per week, lasts for seconds and resolves spontaneously.  She denies any dizziness, presyncope or syncope.  Home Medications    Current Outpatient Medications  Medication Sig Dispense Refill   amLODipine  (NORVASC ) 10 MG tablet Take 1 tablet (10 mg total) by mouth daily. 90 tablet 3   apixaban  (ELIQUIS ) 5 MG TABS tablet Take 1 tablet (5 mg total) by mouth 2 (two) times daily. Take 0.5 tablets (2.5 mg total) by mouth 2 (two) times daily for the first and second day after surgery.  Then on the third day after surgery, resume your normal dose of 1 tablet (5 mg total) by mouth 2 (two) times daily. (Patient taking differently: Take 5 mg by mouth 2 (two) times daily.)     BENADRYL  ALLERGY 25 MG tablet Take 25 mg by mouth at bedtime.     carvedilol  (COREG ) 6.25 MG tablet  Take 1 tablet (6.25 mg total) by mouth 2 (two) times daily. 180 tablet 3   cholecalciferol (VITAMIN D3) 25 MCG (1000 UNIT) tablet Take 1,000 Units by mouth daily.     Continuous Blood Gluc Sensor (FREESTYLE LIBRE 2 SENSOR) MISC Inject 1 Device into the skin every 14 (fourteen) days.     CYLTEZO-CD/UC/HS STARTER 40 MG/0.8ML AJKT Inject 160 mLs into the skin every 14 (fourteen) days. Started 09/10/2022.     ferrous sulfate  325 (65 FE) MG tablet Take 325 mg by mouth daily before breakfast.     glucose blood (ACCU-CHEK GUIDE) test strip Use to test blood sugars up to 4 times daily as directed. 100 each 0   HUMALOG  KWIKPEN 100 UNIT/ML KwikPen Inject 5 Units into the skin with breakfast, with lunch, and with evening meal. 15 mL 11   Magnesium  400 MG CAPS Take 400 mg by mouth at bedtime.     Melatonin 10 MG TABS Take 10 mg by mouth at bedtime.     metFORMIN  (GLUCOPHAGE ) 500 MG tablet Take 500 mg by mouth 2 (two) times daily with a meal.     Multiple Vitamin (MULTIVITAMIN) tablet Take 1 tablet by mouth daily with breakfast.     olmesartan  (BENICAR ) 40 MG tablet Take 1  tablet (40 mg total) by mouth daily. 90 tablet 3   omeprazole (PRILOSEC) 20 MG capsule Take 20 mg by mouth daily.     rosuvastatin  (CRESTOR ) 10 MG tablet Take 10 mg by mouth daily.     SACCHAROMYCES BOULARDII PO Take by mouth in the morning. 2 gummies     traMADol  (ULTRAM ) 50 MG tablet Take 2 tablets (100 mg total) by mouth every 8 (eight) hours as needed for moderate pain. 20 tablet 0   TURMERIC PO Take 1 capsule by mouth every morning.     venlafaxine  XR (EFFEXOR -XR) 37.5 MG 24 hr capsule Take 37.5 mg by mouth daily with breakfast.     No current facility-administered medications for this visit.     Review of Systems   She denies chest pain, palpitations, dyspnea, pnd, orthopnea, n, v, dizziness, syncope, weight gain, or early satiety. All other systems reviewed and are otherwise negative except as noted above.   Physical Exam     VS:  BP 128/80 (BP Location: Right Arm, Patient Position: Sitting, Cuff Size: Normal)   Pulse 93   Ht 5\' 2"  (1.575 m)   Wt 176 lb (79.8 kg)   SpO2 97%   BMI 32.19 kg/m   GEN: Well nourished, well developed, in no acute distress. HEENT: normal. Neck: Supple, no JVD, carotid bruits, or masses. Cardiac: RRR, no murmurs, rubs, or gallops. No clubbing, cyanosis, nonpitting bilateral lower extremity edema.  Radials/DP/PT 2+ and equal bilaterally.  Respiratory:  Respirations regular and unlabored, clear to auscultation bilaterally. GI: Soft, nontender, nondistended, BS + x 4. MS: no deformity or atrophy. Skin: warm and dry, no rash. Neuro:  Strength and sensation are intact. Psych: Normal affect.  Accessory Clinical Findings    ECG personally reviewed by me today - EKG Interpretation Date/Time:  Thursday Sep 07 2023 10:38:27 EDT Ventricular Rate:  93 PR Interval:  240 QRS Duration:  84 QT Interval:  388 QTC Calculation: 482 R Axis:   36  Text Interpretation: Sinus rhythm with 1st degree A-V block When compared with ECG of 18-Jul-2023 10:52, No significant change was found Confirmed by Marlana Silvan ([id number]) on 09/07/2023 10:39:53 AM  - no acute changes.   Lab Results  Component Value Date   WBC 17.6 (H) 04/25/2023   HGB 11.0 (L) 04/25/2023   HCT 32.4 (L) 04/25/2023   MCV 86.9 04/25/2023   PLT 179 04/25/2023   Lab Results  Component Value Date   CREATININE 0.97 04/25/2023   BUN 28 (H) 04/25/2023   NA 131 (L) 04/25/2023   K 3.8 04/25/2023   CL 99 04/25/2023   CO2 21 (L) 04/25/2023   Lab Results  Component Value Date   ALT 19 04/11/2023   AST 19 04/11/2023   ALKPHOS 71 04/11/2023   BILITOT 0.4 04/11/2023   Lab Results  Component Value Date   CHOL 178 08/08/2021   HDL 40 (L) 08/08/2021   LDLCALC 113 (H) 08/08/2021   TRIG 126 08/08/2021   CHOLHDL 4.5 08/08/2021    Lab Results  Component Value Date   HGBA1C 6.1 (H) 04/11/2023    Assessment & Plan   1.  Hypertension/bilateral lower extremity edema: BP has been well-controlled.  Unfortunately, over the past 2 weeks she has noticed increased dependent bilateral lower extremity edema. This has coincided with initiation of amlodipine .  Her swelling improves with elevation.  She denies any dyspnea, PND, orthopnea, weight gain.  Well compensated on exam.  Most recent echo as below.  Given new bilateral lower extremity edema, will decrease amlodipine  to 5 mg daily.  Will increase carvedilol  to 12.5 mg twice daily.  Continue to monitor BP and report BP consider than 130/80, HR consistently less than 55 bpm.  Monitor symptoms.    2. Paroxysmal atrial fibrillation: Maintaining sinus rhythm.  She notes rare fleeting palpitations, denies any significant breakthrough atrial fibrillation.  Continue carvedilol  as above, Eliquis .    3. Mitral valve regurgitation: Most recent echo in 09/2022 showed EF 55 to 60%, normal LV function, no RWMA, normal RV, mild mitral valve regurgitation.  Recent concern for dependent bilateral lower extremity edema, this has coincided with the initiation of amlodipine  10 mg daily.  Well compensated on exam.  Will decrease amlodipine  as above.  If swelling persists, consider need for repeat echocardiogram, will defer for now.  4. Type 2 diabetes: A1c was 6.1 in 03/2023.  Monitored and managed per PCP.   5. Chronic pneumonitis:  Following with pulmonology.    6. Follicular lymphoma/lung nodule: Follows with hematology/oncology.    7. Disposition:   Follow-up in 2 months, sooner if needed.      Jude Norton, NP 09/07/2023, 10:43 AM

## 2023-09-07 NOTE — Patient Instructions (Signed)
 Medication Instructions:  Decrease Amlodipine  5 mg daily  Increase Carvedilol  12.5 mg twice daily  *If you need a refill on your cardiac medications before your next appointment, please call your pharmacy*  Lab Work: NONE ordered at this time of appointment   Testing/Procedures: NONE ordered at this time of appointment   Follow-Up: At [facility], you and your health needs are our priority.  As part of our continuing mission to provide you with exceptional heart care, our providers are all part of one team.  This team includes your primary Cardiologist (physician) and Advanced Practice Providers or APPs (Physician Assistants and Nurse Practitioners) who all work together to provide you with the care you need, when you need it.  Your next appointment:   2 month(s)  Provider:   Euell Herrlich, MD or Marlana Silvan, NP          We recommend signing up for the patient portal called "MyChart".  Sign up information is provided on this After Visit Summary.  MyChart is used to connect with patients for Virtual Visits (Telemedicine).  Patients are able to view lab/test results, encounter notes, upcoming appointments, etc.  Non-urgent messages can be sent to your provider as well.   To learn more about what you can do with MyChart, go to [email or web address].   Other Instructions   Goal BP is less than 130/80. Please report heart rate consistently less than 55 beats per min.

## 2023-09-20 ENCOUNTER — Other Ambulatory Visit: Payer: Self-pay

## 2023-09-20 DIAGNOSIS — C8218 Follicular lymphoma grade II, lymph nodes of multiple sites: Secondary | ICD-10-CM

## 2023-09-23 NOTE — Progress Notes (Signed)
 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   INTERVAL HISTORY  [name] is a 68 y.o. 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   Patient was last seen by me on 03/27/2023 and complained of knee pain and issues with her Crohn's disease. Patients TKA on 04/24/2023 went well and she notes he has recovered well from this. No new lumps or bumps. No fevers/chills/night sweats or unexpected weight loss.   MEDICAL HISTORY:  Past Medical History:  Diagnosis Date   A-fib [facility])    Achalasia    Allergy    year around allergies   Arthritis    fingers, knees, neck, back-osteoarthristis   Bronchitis    hx of   Complication of anesthesia    Crohn's disease (HCC)    Dysrhythmia    a. 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   GERD (gastroesophageal reflux disease)    hx of reflux-went away with elevating HOB    Hypertension    Pneumonia    hx of    PONV (postoperative nausea and vomiting)    Thyroid  nodule    mulitple nodules that are being monitored   Type 2 diabetes mellitus (HCC) 08/02/2021    SURGICAL HISTORY: Past Surgical History:  Procedure Laterality Date   ABDOMINAL HYSTERECTOMY     ADENOIDECTOMY      ANKLE SURGERY Right    BIOPSY  03/10/2022   Procedure: BIOPSY;  Surgeon: Ozell Blunt, MD;  Location[facility] ENDOSCOPY;  Service: Gastroenterology;;   BRONCHIAL BRUSHINGS  08/03/2021   Procedure: BRONCHIAL BRUSHINGS;  Surgeon: Denson Flake, MD;  Location: [facility] ENDOSCOPY;  Service: Cardiopulmonary;;   BRONCHIAL WASHINGS  08/03/2021   Procedure: BRONCHIAL WASHINGS;  Surgeon: Denson Flake, MD;  Location: [facility] ENDOSCOPY;  Service: Cardiopulmonary;;   cosmetic surgeries     brow lift and lipsuction on abdominal, breast implants   ESOPHAGEAL MANOMETRY N/A 12/02/2015   Procedure: ESOPHAGEAL MANOMETRY (EM);  Surgeon: Evangeline Hilts, MD;  Location: [facility] ENDOSCOPY;  Service: Endoscopy;  Laterality: N/A;   ESOPHAGOGASTRODUODENOSCOPY N/A 12/02/2015   Procedure: ESOPHAGOGASTRODUODENOSCOPY (EGD);  Surgeon: Evangeline Hilts, MD;  Location: [facility] ENDOSCOPY;  Service: Endoscopy;  Laterality: N/A;   FLEXIBLE SIGMOIDOSCOPY N/A 03/10/2022   Procedure: FLEXIBLE SIGMOIDOSCOPY;  Surgeon: Ozell Blunt, MD;  Location: [facility] ENDOSCOPY;  Service: Gastroenterology;  Laterality: N/A;   IR KYPHO THORACIC WITH BONE BIOPSY  11/22/2022   IR RADIOLOGIST EVAL & MGMT  11/08/2022   IR RADIOLOGIST EVAL & MGMT  12/19/2022   IR VERTEBROPLASTY CERV/THOR BX INC UNI/BIL INC/INJECT/IMAGING  10/05/2022   lymph node removal left leg     cat scratch surgery    ORIF ANKLE FRACTURE Right 09/03/2021   Procedure: OPEN REDUCTION INTERNAL FIXATION (ORIF) ANKLE FRACTURE;  Surgeon:  Murleen Arms, MD;  Location: [facility] OR;  Service: Orthopedics;  Laterality: Right;   THROAT SURGERY     UPPP for sleep apnea   TONSILLECTOMY     TOTAL KNEE ARTHROPLASTY Right 04/24/2023   Procedure: TOTAL KNEE ARTHROPLASTY;  Surgeon: Murleen Arms, MD;  Location: [facility] ORS;  Service: Orthopedics;  Laterality: Right;   VIDEO BRONCHOSCOPY  08/03/2021   Procedure: VIDEO BRONCHOSCOPY WITH FLUORO;  Surgeon: Denson Flake, MD;  Location: [facility] ENDOSCOPY;  Service:  Cardiopulmonary;;    SOCIAL HISTORY: Social History   Socioeconomic History   Marital status: Married    Spouse name: Not on file   Number of children: Not on file   Years of education: Not on file   Highest education level: Not on file  Occupational History   Not on file  Tobacco Use   Smoking status: Never   Smokeless tobacco: Never  Vaping Use   Vaping status: Never Used  Substance and Sexual Activity   Alcohol  use: No    Alcohol /week: 0.0 standard drinks of alcohol    Drug use: No   Sexual activity: Yes  Other Topics Concern   Not on file  Social History Narrative   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, newspaper at [employer]   Never smoker never drinker   No drugs   Multiple allergies   Lives at home with her husband of 8 years since 2008   Social Drivers of Health   Financial Resource Strain: Not on file  Food Insecurity: No Food Insecurity (04/24/2023)   Hunger Vital Sign    Worried About Running Out of Food in the Last Year: Never true    Ran Out of Food in the Last Year: Never true  Transportation Needs: No Transportation Needs (04/24/2023)   PRAPARE - Administrator, Civil Service (Medical): No    Lack of Transportation (Non-Medical): No  Physical Activity: Not on file  Stress: Not on file  Social Connections: Socially Isolated (04/24/2023)   Social Connection and Isolation Panel [NHANES]    Frequency of Communication with Friends and Family: Once a week    Frequency of Social Gatherings with Friends and Family: Never    Attends Religious Services: Never    Database administrator or Organizations: No    Attends Banker Meetings: Never    Marital Status: Married  Catering manager Violence: Not At Risk (04/24/2023)   Humiliation, Afraid, Rape, and Kick questionnaire    Fear of Current or Ex-Partner: No    Emotionally Abused: No    Physically Abused: No    Sexually Abused: No    FAMILY HISTORY: Family History   Problem Relation Age of Onset   Lung cancer Father        1982 died from it   Hernia Mother    Breast cancer Maternal Aunt        multiple    Breast cancer Maternal Grandmother    Breast cancer Paternal Grandmother    Breast cancer Cousin        cousins multiple    Colon cancer Neg Hx    Colon polyps Neg Hx    Rectal cancer Neg Hx    Stomach cancer Neg Hx     ALLERGIES:  is allergic to latex, atenolol , and other.  MEDICATIONS:  Current Outpatient Medications  Medication Sig Dispense Refill   amLODipine  (NORVASC ) 5 MG tablet Take 1 tablet (5 mg total) by mouth daily.  30 tablet 3   apixaban  (ELIQUIS ) 5 MG TABS tablet Take 1 tablet (5 mg total) by mouth 2 (two) times daily. Take 0.5 tablets (2.5 mg total) by mouth 2 (two) times daily for the first and second day after surgery.  Then on the third day after surgery, resume your normal dose of 1 tablet (5 mg total) by mouth 2 (two) times daily. (Patient taking differently: Take 5 mg by mouth 2 (two) times daily.)     BENADRYL  ALLERGY 25 MG tablet Take 25 mg by mouth at bedtime.     carvedilol  (COREG ) 12.5 MG tablet Take 1 tablet (12.5 mg total) by mouth 2 (two) times daily. 60 tablet 3   cholecalciferol (VITAMIN D3) 25 MCG (1000 UNIT) tablet Take 1,000 Units by mouth daily.     Continuous Blood Gluc Sensor (FREESTYLE LIBRE 2 SENSOR) MISC Inject 1 Device into the skin every 14 (fourteen) days.     CYLTEZO-CD/UC/HS STARTER 40 MG/0.8ML AJKT Inject 160 mLs into the skin every 14 (fourteen) days. Started 09/10/2022.     ferrous sulfate  325 (65 FE) MG tablet Take 325 mg by mouth daily before breakfast.     glucose blood (ACCU-CHEK GUIDE) test strip Use to test blood sugars up to 4 times daily as directed. 100 each 0   HUMALOG  KWIKPEN 100 UNIT/ML KwikPen Inject 5 Units into the skin with breakfast, with lunch, and with evening meal. 15 mL 11   Magnesium  400 MG CAPS Take 400 mg by mouth at bedtime.     Melatonin 10 MG TABS Take 10 mg by mouth at  bedtime.     metFORMIN  (GLUCOPHAGE ) 500 MG tablet Take 500 mg by mouth 2 (two) times daily with a meal.     Multiple Vitamin (MULTIVITAMIN) tablet Take 1 tablet by mouth daily with breakfast.     olmesartan  (BENICAR ) 40 MG tablet Take 1 tablet (40 mg total) by mouth daily. 90 tablet 3   omeprazole (PRILOSEC) 20 MG capsule Take 20 mg by mouth daily.     rosuvastatin  (CRESTOR ) 10 MG tablet Take 10 mg by mouth daily.     SACCHAROMYCES BOULARDII PO Take by mouth in the morning. 2 gummies     traMADol  (ULTRAM ) 50 MG tablet Take 2 tablets (100 mg total) by mouth every 8 (eight) hours as needed for moderate pain. 20 tablet 0   TURMERIC PO Take 1 capsule by mouth every morning.     venlafaxine  XR (EFFEXOR -XR) 37.5 MG 24 hr capsule Take 37.5 mg by mouth daily with breakfast.     No current facility-administered medications for this visit.    REVIEW OF SYSTEMS:  10 Point review of Systems was done is negative except as noted above.   PHYSICAL EXAMINATION:  Vitals:   09/25/23 1205  BP: 118/82  Pulse: 86  Resp: 18  Temp: (!) 97.3 F (36.3 C)  SpO2: 97%   GENERAL:alert, in no acute distress and comfortable SKIN: no acute rashes, no significant lesions EYES: conjunctiva are pink and non-injected, sclera anicteric OROPHARYNX: MMM, no exudates, no oropharyngeal erythema or ulceration NECK: supple, no JVD LYMPH:  no palpable lymphadenopathy in the cervical, axillary or inguinal regions LUNGS: clear to auscultation b/l with normal respiratory effort HEART: regular rate & rhythm ABDOMEN:  normoactive bowel sounds , non tender, not distended. Extremity: no pedal edema PSYCH: alert & oriented x 3 with fluent speech NEURO: no focal motor/sensory deficits   LABORATORY DATA:  I have reviewed the data as listed  Latest Ref Rng & Units 04/25/2023    3:26 AM 04/11/2023   12:16 PM 03/27/2023   11:51 AM  CBC  WBC 4.0 - 10.5 K/uL 17.6  9.1  12.0   Hemoglobin 12.0 - 15.0 g/dL 16.1  09.6  04.5    Hematocrit 36.0 - 46.0 % 32.4  38.4  41.8   Platelets 150 - 400 K/uL 179  222  238        Latest Ref Rng & Units 04/25/2023    3:26 AM 04/11/2023   12:16 PM 03/27/2023   11:51 AM  CMP  Glucose 70 - 99 mg/dL 409  811  914   BUN 8 - 23 mg/dL 28  25  22    Creatinine 0.44 - 1.00 mg/dL 7.82  9.56  2.13   Sodium 135 - 145 mmol/L 131  139  142   Potassium 3.5 - 5.1 mmol/L 3.8  3.8  4.1   Chloride 98 - 111 mmol/L 99  102  101   CO2 22 - 32 mmol/L 21  27  32   Calcium  8.9 - 10.3 mg/dL 8.4  9.1  08.6   Total Protein 6.5 - 8.1 g/dL  6.7  6.8   Total Bilirubin <1.2 mg/dL  0.4  0.4   Alkaline Phos 38 - 126 U/L  71  83   AST 15 - 41 U/L  19  16   ALT 0 - 44 U/L  19  17    . Lab Results  Component Value Date   LDH 204 (H) 03/27/2023   05/14/2019 CT ANGIO CHEST PE W OR WO CONTRAST (Accession [id number]):   03/08/2019 Lymph Node Biopsy [phone]):      RADIOGRAPHIC STUDIES: I have personally reviewed the radiological images as listed and agreed with the findings in the report. No results found.  ASSESSMENT & PLAN:   68 y.o. female with:  #1 Stage III/IV follicular lymphoma - likely low grade but accurate grading difficult  11/02/2018 MRI lumbar spine wo contrast revealed "Extensive retroperitoneal lymphadenopathy highly worrisome for neoplastic process such as lymphoproliferative disease. Spondylosis worst at L4-5 where there is narrowing in the right subarticular recess predominantly due to a synovial cyst off the medial margin of the right facet with encroachment on the descending right L5 root. There is mild central canal narrowing overall at this Level."  12/04/2018 CT CAP revealed "Mild-to-moderate abdominal and pelvic lymphadenopathy and new 9 mm left superior mediastinal lymph node, highly suspicious for lymphoproliferative disorder. Stable 8 mm lingular pulmonary nodule, consistent with benign Etiology. Moderate hepatic steatosis. 1.3 cm indeterminate low-attenuation  lesion in the right lobe. Recommend continued attention on follow-up CT."  12/18/2018 lymph node biopsy pathology revealing a follicular lymphoma- likely low grade but accurate grading not possible on limited sampling.  02/06/2019 PET/CT scan ([id number]) revealed "1. Bulky hypermetabolic lymphadenopathy in the abdomen and upper pelvis, as described. This is associated with mild hypermetabolic lymphadenopathy in the upper mediastinum and right axilla. Hypermetabolic lymphadenopathy represents a combination of Deauville 4 and Deauville 5 disease. 2. 8 mm nodule in the lingula without hypermetabolism. Attention on follow-up recommended. 3. Hepatic steatosis. 4.  Aortic Atherosclerois (ICD10-170.0)."  03/08/2019 Lymph Node Biopsy ([id number]) revealed "LYMPH NODE, NEEDLE CORE BIOPSY: - Follicular lymphoma (grade 1-2 of 3) with elevated proliferation rate."  2. Mild treatment related thrombocytopenia- resolved.   3.  H/o Melena.  2 episodes about a month ago.  Hemoglobin stable today. Previously had upper abdominal discomfort which has resolved. Has some  chronic nausea due to achalasia.  4.  Chronic ALT elevation -likely from her fatty liver. Plan -Follow-up with PCP and GI for management of hepatic steatosis/steatohepatitis. -Avoid hepatotoxic medications. -Recommended optimal diet and exercise and to optimize control of diabetes with PCP.  5. DM2 -following with PCP  6.  Bilateral Extensive multifocal groundglass opacities with interstitial thickening./Interstitial lung disease following with Dr. Diania Fortes from pulmonary. Currently on steroid taper and is off home oxygen.  Plan:  -Discussed lab results from today, 09/25/2023, in detail with patient  Cbc/diff-- wnl Cmp stable  LDH is WNL ar 171 Paitent has no clinical or lab finiings suggestive of significant follicular lymphoma progression at this time. CT chest wo contrast done on 03/15/2023 showed a few lung nodules and will obtain  interval imaging for monitoring.  FOLLOW-UP:  CT chest wo contrast in 22 weeks RTC with Dr Salomon Cree with labs in 24 weeks   The total time spent in the appointment was 25 minutes* .  All of the patient's questions were answered with apparent satisfaction. The patient knows to call the clinic with any problems, questions or concerns.   Jacquelyn Matt MD MS AAHIVMS [facility] Hematology/Oncology Physician [facility]  .*Total Encounter Time as defined by the Centers for Medicare and Medicaid Services includes, in addition to the face-to-face time of a patient visit (documented in the note above) non-face-to-face time: obtaining and reviewing outside history, ordering and reviewing medications, tests or procedures, care coordination (communications with other health care professionals or caregivers) and documentation in the medical record.    I,Mitra Faeizi,acting as a Neurosurgeon for Jacquelyn Matt, MD.,have documented all relevant documentation on the behalf of Jacquelyn Matt, MD,as directed by  Jacquelyn Matt, MD while in the presence of Jacquelyn Matt, MD.  .I have reviewed the above documentation for accuracy and completeness, and I agree with the above. .Saiquan Hands Kishore Leasia Swann MD

## 2023-09-25 ENCOUNTER — Inpatient Hospital Stay: Payer: Medicare Other | Attending: Hematology

## 2023-09-25 ENCOUNTER — Inpatient Hospital Stay: Payer: Medicare Other | Admitting: Hematology

## 2023-09-25 VITALS — BP 118/82 | HR 86 | Temp 97.3°F | Resp 18 | Wt 172.1 lb

## 2023-09-25 DIAGNOSIS — I10 Essential (primary) hypertension: Secondary | ICD-10-CM | POA: Diagnosis not present

## 2023-09-25 DIAGNOSIS — I4891 Unspecified atrial fibrillation: Secondary | ICD-10-CM | POA: Diagnosis not present

## 2023-09-25 DIAGNOSIS — R918 Other nonspecific abnormal finding of lung field: Secondary | ICD-10-CM

## 2023-09-25 DIAGNOSIS — K760 Fatty (change of) liver, not elsewhere classified: Secondary | ICD-10-CM | POA: Insufficient documentation

## 2023-09-25 DIAGNOSIS — M47816 Spondylosis without myelopathy or radiculopathy, lumbar region: Secondary | ICD-10-CM | POA: Diagnosis not present

## 2023-09-25 DIAGNOSIS — Z7901 Long term (current) use of anticoagulants: Secondary | ICD-10-CM | POA: Insufficient documentation

## 2023-09-25 DIAGNOSIS — E119 Type 2 diabetes mellitus without complications: Secondary | ICD-10-CM | POA: Insufficient documentation

## 2023-09-25 DIAGNOSIS — M25569 Pain in unspecified knee: Secondary | ICD-10-CM | POA: Diagnosis not present

## 2023-09-25 DIAGNOSIS — C8218 Follicular lymphoma grade II, lymph nodes of multiple sites: Secondary | ICD-10-CM

## 2023-09-25 DIAGNOSIS — Z801 Family history of malignant neoplasm of trachea, bronchus and lung: Secondary | ICD-10-CM | POA: Insufficient documentation

## 2023-09-25 DIAGNOSIS — Z803 Family history of malignant neoplasm of breast: Secondary | ICD-10-CM | POA: Diagnosis not present

## 2023-09-25 DIAGNOSIS — Z79899 Other long term (current) drug therapy: Secondary | ICD-10-CM | POA: Diagnosis not present

## 2023-09-25 DIAGNOSIS — Z90710 Acquired absence of both cervix and uterus: Secondary | ICD-10-CM | POA: Diagnosis not present

## 2023-09-25 DIAGNOSIS — M48061 Spinal stenosis, lumbar region without neurogenic claudication: Secondary | ICD-10-CM | POA: Diagnosis not present

## 2023-09-25 LAB — CMP (CANCER CENTER ONLY)
ALT: 19 U/L (ref 0–44)
AST: 19 U/L (ref 15–41)
Albumin: 4.5 g/dL (ref 3.5–5.0)
Alkaline Phosphatase: 84 U/L (ref 38–126)
Anion gap: 10 (ref 5–15)
BUN: 16 mg/dL (ref 8–23)
CO2: 31 mmol/L (ref 22–32)
Calcium: 9.5 mg/dL (ref 8.9–10.3)
Chloride: 100 mmol/L (ref 98–111)
Creatinine: 1.08 mg/dL — ABNORMAL HIGH (ref 0.44–1.00)
GFR, Estimated: 55 mL/min — ABNORMAL LOW (ref >60)
Glucose, Bld: 198 mg/dL — ABNORMAL HIGH (ref 70–99)
Potassium: 3.6 mmol/L (ref 3.5–5.1)
Sodium: 141 mmol/L (ref 135–145)
Total Bilirubin: 0.4 mg/dL (ref 0.0–1.2)
Total Protein: 6.7 g/dL (ref 6.5–8.1)

## 2023-09-25 LAB — CBC WITH DIFFERENTIAL (CANCER CENTER ONLY)
Abs Immature Granulocytes: 0.04 10*3/uL (ref 0.00–0.07)
Basophils Absolute: 0.1 10*3/uL (ref 0.0–0.1)
Basophils Relative: 1 %
Eosinophils Absolute: 0.2 10*3/uL (ref 0.0–0.5)
Eosinophils Relative: 2 %
HCT: 38.4 % (ref 36.0–46.0)
Hemoglobin: 13.5 g/dL (ref 12.0–15.0)
Immature Granulocytes: 1 %
Lymphocytes Relative: 19 %
Lymphs Abs: 1.3 10*3/uL (ref 0.7–4.0)
MCH: 29.3 pg (ref 26.0–34.0)
MCHC: 35.2 g/dL (ref 30.0–36.0)
MCV: 83.3 fL (ref 80.0–100.0)
Monocytes Absolute: 0.8 10*3/uL (ref 0.1–1.0)
Monocytes Relative: 12 %
Neutro Abs: 4.4 10*3/uL (ref 1.7–7.7)
Neutrophils Relative %: 65 %
Platelet Count: 214 10*3/uL (ref 150–400)
RBC: 4.61 MIL/uL (ref 3.87–5.11)
RDW: 12 % (ref 11.5–15.5)
WBC Count: 6.8 10*3/uL (ref 4.0–10.5)
nRBC: 0 % (ref 0.0–0.2)

## 2023-09-25 LAB — LACTATE DEHYDROGENASE: LDH: 171 U/L (ref 98–192)

## 2023-09-25 LAB — SAMPLE TO BLOOD BANK

## 2023-09-28 ENCOUNTER — Other Ambulatory Visit: Payer: Self-pay

## 2023-10-02 ENCOUNTER — Encounter: Payer: Self-pay | Admitting: Hematology

## 2023-10-21 ENCOUNTER — Encounter (HOSPITAL_COMMUNITY): Payer: Self-pay | Admitting: Interventional Radiology

## 2023-11-07 ENCOUNTER — Ambulatory Visit: Admitting: Nurse Practitioner

## 2023-11-16 ENCOUNTER — Other Ambulatory Visit: Payer: Self-pay

## 2023-11-16 DIAGNOSIS — I480 Paroxysmal atrial fibrillation: Secondary | ICD-10-CM

## 2023-11-16 MED ORDER — APIXABAN 5 MG PO TABS: 5.0000 mg | ORAL_TABLET | Freq: Two times a day (BID) | ORAL | 1 refills | Status: AC

## 2023-11-16 NOTE — Telephone Encounter (Signed)
 Prescription refill request for Eliquis  received. Indication: Afib  Last office visit: 09/07/23 ([facility])  Scr: 1.08 (09/25/23)  Age: 68 Weight: 78.1kg  Appropriate dose. Refill sent.

## 2023-12-05 ENCOUNTER — Ambulatory Visit: Admitting: Nurse Practitioner

## 2023-12-18 ENCOUNTER — Other Ambulatory Visit: Payer: Self-pay

## 2023-12-18 MED ORDER — CARVEDILOL 12.5 MG PO TABS: 12.5000 mg | ORAL_TABLET | Freq: Two times a day (BID) | ORAL | 2 refills | Status: AC

## 2023-12-21 ENCOUNTER — Other Ambulatory Visit: Payer: Self-pay | Admitting: Orthopaedic Surgery

## 2023-12-21 DIAGNOSIS — M25511 Pain in right shoulder: Secondary | ICD-10-CM

## 2023-12-22 ENCOUNTER — Telehealth: Payer: Self-pay

## 2023-12-22 ENCOUNTER — Ambulatory Visit: Admitting: Nurse Practitioner

## 2023-12-22 DIAGNOSIS — K5090 Crohn's disease, unspecified, without complications: Secondary | ICD-10-CM | POA: Insufficient documentation

## 2023-12-22 NOTE — Telephone Encounter (Signed)
   Pre-operative Risk Assessment    Patient Name: Meghan Alexander  DOB: [birth year] MRN: [id number]   Date of last office visit: 09/07/23 DAMIEN BRAVER, NP Date of next office visit: 12/22/23 [facility], NP   Request for Surgical Clearance    Procedure:  RIGHT REVERSE TOTAL SHOULDER ARTHROPLASTY  Date of Surgery:  Clearance TBD                                Surgeon:  BONNER HAIR, MD Surgeon's Group or Practice Name:  [facility] Phone number:  [phone]  EXT [phone] Fax number:  [phone]  ATTN: MAEOLA DIVERS   Type of Clearance Requested:   - Medical  - Pharmacy:  Hold Apixaban  (Eliquis )     Type of Anesthesia:  General  WITH INTERSCALENE BLOCK   Additional requests/questions:    Signed, Lucie DELENA Ku   12/22/2023, 9:15 AM

## 2023-12-22 NOTE — Telephone Encounter (Signed)
   Name: Meghan Alexander  DOB: 19-Apr-1956  MRN: [id number]  Primary Cardiologist: Soyla DELENA Merck, MD  Chart reviewed as part of pre-operative protocol coverage. The patient has an upcoming visit scheduled with Damien Braver, DNP on 12/22/2023 at which time clearance can be addressed in case there are any issues that would impact surgical recommendations.   RIGHT REVERSE TOTAL SHOULDER ARTHROPLASTY is not scheduled until TBD as below. I added preop FYI to appointment note so that provider is aware to address at time of outpatient visit.  Per office protocol the cardiology provider should forward their finalized clearance decision and recommendations regarding antiplatelet therapy to the requesting party below.    This message will also be routed to pharmacy pool  for input on holding Eliquis  as requested below so that this information is available to the clearing provider at time of patient's appointment.   I will route this message as FYI to requesting party and remove this message from the preop box as separate preop APP input not needed at this time.   Please call with any questions.  Lamarr Satterfield, NP  12/22/2023, 9:25 AM

## 2023-12-22 NOTE — Telephone Encounter (Signed)
 Patient with diagnosis of A Fib on Eliquis  for anticoagulation.    Procedure: RIGHT REVERSE TOTAL SHOULDER ARTHROPLASTY  Date of procedure: TBD   CHA2DS2-VASc Score = 4  This indicates a 4.8% annual risk of stroke. The patient's score is based upon: CHF History: 0 HTN History: 1 Diabetes History: 1 Stroke History: 0 Vascular Disease History: 0 Age Score: 1 Gender Score: 1     CrCl 60 ml/min Platelet count 214K  Patient has not had an Afib/aflutter ablation within the last 3 months or DCCV within the last 30 days  Per office protocol, patient can hold Eliquis  for 3 days prior to procedure.    Patient will not need bridging with Lovenox  (enoxaparin ) around procedure.  **This guidance is not considered finalized until pre-operative APP has relayed final recommendations.**

## 2023-12-22 NOTE — Telephone Encounter (Signed)
 I have notified Meghan Alexander via text to let her know that pharmacy has commented on Eliquis  for her appointment today.

## 2023-12-22 NOTE — Progress Notes (Deleted)
 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   Past Medical History    Past Medical History:  Diagnosis Date   A-fib [facility])    Achalasia    Allergy    year around allergies   Arthritis    fingers, knees, neck, back-osteoarthristis   Bronchitis    hx of   Complication of anesthesia    Crohn's disease (HCC)    Dysrhythmia    a. 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   GERD (gastroesophageal reflux disease)    hx of reflux-went away with elevating HOB    Hypertension    Pneumonia    hx of    PONV (postoperative nausea and vomiting)    Thyroid  nodule    mulitple nodules that are being monitored   Type 2 diabetes mellitus (HCC) 08/02/2021   Past Surgical History:  Procedure Laterality Date   ABDOMINAL HYSTERECTOMY     ADENOIDECTOMY     ANKLE SURGERY Right    BIOPSY  03/10/2022   Procedure: BIOPSY;  Surgeon: Rosalie Kitchens, MD;  Location: [facility] ENDOSCOPY;  Service: Gastroenterology;;   BRONCHIAL BRUSHINGS  08/03/2021   Procedure: BRONCHIAL BRUSHINGS;  Surgeon: Shelah Lamar RAMAN, MD;  Location: [facility] ENDOSCOPY;  Service: Cardiopulmonary;;   BRONCHIAL WASHINGS  08/03/2021   Procedure: BRONCHIAL WASHINGS;  Surgeon: Shelah Lamar RAMAN, MD;  Location: [facility] ENDOSCOPY;  Service: Cardiopulmonary;;   cosmetic surgeries     brow lift and lipsuction on abdominal, breast implants   ESOPHAGEAL MANOMETRY N/A 12/02/2015   Procedure: ESOPHAGEAL MANOMETRY (EM);  Surgeon: Elsie Cree, MD;  Location: [facility] ENDOSCOPY;   Service: Endoscopy;  Laterality: N/A;   ESOPHAGOGASTRODUODENOSCOPY N/A 12/02/2015   Procedure: ESOPHAGOGASTRODUODENOSCOPY (EGD);  Surgeon: Elsie Cree, MD;  Location: [facility] ENDOSCOPY;  Service: Endoscopy;  Laterality: N/A;   FLEXIBLE SIGMOIDOSCOPY N/A 03/10/2022   Procedure: FLEXIBLE SIGMOIDOSCOPY;  Surgeon: Rosalie Kitchens, MD;  Location: [facility] ENDOSCOPY;  Service: Gastroenterology;  Laterality: N/A;   IR KYPHO THORACIC WITH BONE BIOPSY  11/22/2022   IR RADIOLOGIST EVAL & MGMT  11/08/2022   IR RADIOLOGIST EVAL & MGMT  12/19/2022   IR VERTEBROPLASTY CERV/THOR BX INC UNI/BIL INC/INJECT/IMAGING  10/05/2022   lymph node removal left leg     cat scratch surgery    ORIF ANKLE FRACTURE Right 09/03/2021   Procedure: OPEN REDUCTION INTERNAL FIXATION (ORIF) ANKLE FRACTURE;  Surgeon: Edna Toribio DELENA, MD;  Location: [facility] OR;  Service: Orthopedics;  Laterality: Right;   THROAT SURGERY     UPPP for sleep apnea   TONSILLECTOMY     TOTAL KNEE ARTHROPLASTY Right 04/24/2023   Procedure: TOTAL KNEE ARTHROPLASTY;  Surgeon: Edna Toribio DELENA, MD;  Location: [facility] ORS;  Service: Orthopedics;  Laterality: Right;   VIDEO BRONCHOSCOPY  08/03/2021   Procedure: VIDEO BRONCHOSCOPY WITH FLUORO;  Surgeon: Shelah Lamar RAMAN, MD;  Location: [facility] ENDOSCOPY;  Service: Cardiopulmonary;;    Allergies  Allergies  Allergen Reactions   Latex Other (See Comments)    Blisters / skin peeling   Atenolol  Cough   Other Itching, Swelling and Other (See Comments)  DermaPlast or ANY SURGICAL GLUE Used after surgery - caused swelling, itching, and wound broke open     Labs/Other Studies Reviewed    The following studies were reviewed today:  Cardiac Studies & Procedures   ______________________________________________________________________________________________     ECHOCARDIOGRAM  ECHOCARDIOGRAM COMPLETE 10/06/2022  Narrative ECHOCARDIOGRAM REPORT    Patient Name:   Meghan Alexander Date of Exam: 10/06/2022 Medical  Rec #:  [id number]      Height:       62.0 in Accession #:    [id number]     Weight:       176.4 lb Date of Birth:  1955-11-06       BSA:          1.812 m Patient Age:    67 years       BP:           162/96 mmHg Patient Gender: F              HR:           101 bpm. Exam Location:  Inpatient  Procedure: 2D Echo, Cardiac Doppler, Color Doppler and Intracardiac Opacification Agent  Indications:    Edema  History:        Patient has prior history of Echocardiogram examinations, most recent 10/08/2021. Arrythmias:Atrial Fibrillation; Risk Factors:Diabetes and Hypertension. ILD, lymphoma.  Sonographer:    Clotilda Center Referring Phys: [id number] ALLISON L ELLIS   Sonographer Comments: Image acquisition challenging due to patient body habitus and Image acquisition challenging due to respiratory motion. IMPRESSIONS   1. Left ventricular ejection fraction, by estimation, is 55 to 60%. The left ventricle has normal function. The left ventricle has no regional wall motion abnormalities. Indeterminate diastolic filling due to E-A fusion. 2. Right ventricular systolic function is normal. The right ventricular size is normal. 3. The mitral valve is normal in structure. Mild mitral valve regurgitation. No evidence of mitral stenosis. 4. The aortic valve is normal in structure. Aortic valve regurgitation is not visualized. No aortic stenosis is present. 5. The inferior vena cava is normal in size with greater than 50% respiratory variability, suggesting right atrial pressure of 3 mmHg.  FINDINGS Left Ventricle: Left ventricular ejection fraction, by estimation, is 55 to 60%. The left ventricle has normal function. The left ventricle has no regional wall motion abnormalities. The left ventricular internal cavity size was normal in size. There is no left ventricular hypertrophy. Indeterminate diastolic filling due to E-A fusion.  Right Ventricle: The right ventricular size is normal. No increase in right  ventricular wall thickness. Right ventricular systolic function is normal.  Left Atrium: Left atrial size was normal in size.  Right Atrium: Right atrial size was normal in size.  Pericardium: There is no evidence of pericardial effusion.  Mitral Valve: The mitral valve is normal in structure. There is mild calcification of the mitral valve leaflet(s). Mild mitral valve regurgitation. No evidence of mitral valve stenosis.  Tricuspid Valve: The tricuspid valve is normal in structure. Tricuspid valve regurgitation is not demonstrated. No evidence of tricuspid stenosis.  Aortic Valve: The aortic valve is normal in structure. Aortic valve regurgitation is not visualized. No aortic stenosis is present.  Pulmonic Valve: The pulmonic valve was not well visualized. Pulmonic valve regurgitation is not visualized. No evidence of pulmonic stenosis.  Aorta: The aortic root is normal in size and structure.  Venous: The inferior vena cava is normal in size with greater than 50% respiratory variability, suggesting right atrial pressure of  3 mmHg.  IAS/Shunts: No atrial level shunt detected by color flow Doppler.   LEFT VENTRICLE PLAX 2D LVIDd:         4.10 cm     Diastology LVIDs:         3.20 cm     LV e' medial:    5.11 cm/s LV PW:         0.90 cm     LV E/e' medial:  22.1 LV IVS:        0.70 cm     LV e' lateral:   7.29 cm/s LVOT diam:     2.30 cm     LV E/e' lateral: 15.5 LV SV:         70 LV SV Index:   39 LVOT Area:     4.15 cm  LV Volumes (MOD) LV vol d, MOD A2C: 86.1 ml LV vol d, MOD A4C: 84.8 ml LV vol s, MOD A2C: 39.0 ml LV vol s, MOD A4C: 38.6 ml LV SV MOD A2C:     47.1 ml LV SV MOD A4C:     84.8 ml LV SV MOD BP:      46.5 ml  RIGHT VENTRICLE RV S prime:     9.25 cm/s TAPSE (M-mode): 1.3 cm  LEFT ATRIUM             Index        RIGHT ATRIUM          Index LA diam:        3.80 cm 2.10 cm/m   RA Area:     7.52 cm LA Vol (A2C):   27.8 ml 15.34 ml/m  RA Volume:   12.90 ml  7.12 ml/m LA Vol (A4C):   29.3 ml 16.17 ml/m LA Biplane Vol: 30.4 ml 16.78 ml/m AORTIC VALVE LVOT Vmax:   89.40 cm/s LVOT Vmean:  71.800 cm/s LVOT VTI:    0.168 m  AORTA Ao Root diam: 3.00 cm Ao Asc diam:  3.70 cm  MITRAL VALVE MV Area (PHT): 4.80 cm     SHUNTS MV Decel Time: 158 msec     Systemic VTI:  0.17 m MR Peak grad: 89.1 mmHg     Systemic Diam: 2.30 cm MR Vmax:      472.00 cm/s MV E velocity: 113.00 cm/s  Toribio Fuel MD Electronically signed by Toribio Fuel MD Signature Date/Time: 10/06/2022/11:19:14 AM    Final          ______________________________________________________________________________________________     Recent Labs: 07/14/2023: TSH 0.91 09/25/2023: ALT 19; BUN 16; Creatinine 1.08; Hemoglobin 13.5; Platelet Count 214; Potassium 3.6; Sodium 141  Recent Lipid Panel    Component Value Date/Time   CHOL 178 08/08/2021 0134   TRIG 126 08/08/2021 0134   HDL 40 (L) 08/08/2021 0134   CHOLHDL 4.5 08/08/2021 0134   VLDL 25 08/08/2021 0134   LDLCALC 113 (H) 08/08/2021 0134    History of Present Illness    68 year old female with the above past medical history including paroxysmal atrial fibrillation, hypertension, type 2 diabetes, 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   She has a history of paroxysmal atrial fibrillation, previously on Eliquis , however, this was discontinued in the setting of no recurrence of atrial fibrillation.  She follows with pulmonology for a history of chronic pneumonitis secondary to rituximab  use.  She was diagnosed with Crohn's disease in 2024.  She was hospitalized in June 2024  in the setting of intractable nausea/vomiting, AKI, back pain, bilateral lower extremity edema.  Echocardiogram in 09/2022 showed EF 55 to 60%, normal LV function, no RWMA, normal RV, mild mitral valve regurgitation. She was evaluated in the ED in 10/2022  for shortness of breath, palpitations.  EKG showed atrial fibrillation with RVR.  CT angio of the chest was negative for PE. She was noted to be mildly hypokalemic.  She converted to normal sinus rhythm and was discharged home in stable condition.  Eliquis  was resumed.  At her follow-up visit in 06/2023 she noted increasing elevated BP, she was transitioned from losartan  to olmesartan  40 mg daily.  She contacted our office on 08/01/2023 with concern for ongoing elevated BP.  She was transitioned from metoprolol  to carvedilol .  He was last seen in the office on 09/07/2023 and was stable from a cardiac standpoint.  She did notice increased dependent bilateral lower extremity edema, which coincided with initiation of amlodipine .  Amlodipine  was decreased to 5 mg daily.  Carvedilol  was increased to 12.5 mg twice daily.   She presents today for follow-up. Since her last visit she has    1. Hypertension/bilateral lower extremity edema: BP has been well-controlled.  Unfortunately, over the past 2 weeks she has noticed increased dependent bilateral lower extremity edema. This has coincided with initiation of amlodipine .  Her swelling improves with elevation.  She denies any dyspnea, PND, orthopnea, weight gain.  Well compensated on exam.  Most recent echo as below.  Given new bilateral lower extremity edema, will decrease amlodipine  to 5 mg daily.  Will increase carvedilol  to 12.5 mg twice daily.  Continue to monitor BP and report BP consider than 130/80, HR consistently less than 55 bpm.  Monitor symptoms.    2. Paroxysmal atrial fibrillation: Maintaining sinus rhythm.  She notes rare fleeting palpitations, denies any significant breakthrough atrial fibrillation.  Continue carvedilol  as above, Eliquis .    3. Mitral valve regurgitation: Most recent echo in 09/2022 showed EF 55 to 60%, normal LV function, no RWMA, normal RV, mild mitral valve regurgitation.  Recent concern for dependent bilateral lower extremity edema,  this has coincided with the initiation of amlodipine  10 mg daily.  Well compensated on exam.  Will decrease amlodipine  as above.  If swelling persists, consider need for repeat echocardiogram, will defer for now.   4. Type 2 diabetes: A1c was 6.1 in 03/2023.  Monitored and managed per PCP.   5. Chronic pneumonitis:  Following with pulmonology.    6. Follicular lymphoma/lung nodule: Follows with hematology/oncology.    7. Disposition:   Follow-up in     Home Medications    Current Outpatient Medications  Medication Sig Dispense Refill   amLODipine  (NORVASC ) 5 MG tablet Take 1 tablet (5 mg total) by mouth daily. 30 tablet 3   apixaban  (ELIQUIS ) 5 MG TABS tablet Take 1 tablet (5 mg total) by mouth 2 (two) times daily. 180 tablet 1   BENADRYL  ALLERGY 25 MG tablet Take 25 mg by mouth at bedtime.     carvedilol  (COREG ) 12.5 MG tablet Take 1 tablet (12.5 mg total) by mouth 2 (two) times daily. 180 tablet 2   cholecalciferol (VITAMIN D3) 25 MCG (1000 UNIT) tablet Take 1,000 Units by mouth daily.     Continuous Blood Gluc Sensor (FREESTYLE LIBRE 2 SENSOR) MISC Inject 1 Device into the skin every 14 (fourteen) days.     CYLTEZO-CD/UC/HS STARTER 40 MG/0.8ML AJKT Inject 160 mLs into the skin every 14 (fourteen) days.  Started 09/10/2022.     ferrous sulfate  325 (65 FE) MG tablet Take 325 mg by mouth daily before breakfast.     glucose blood (ACCU-CHEK GUIDE) test strip Use to test blood sugars up to 4 times daily as directed. 100 each 0   HUMALOG  KWIKPEN 100 UNIT/ML KwikPen Inject 5 Units into the skin with breakfast, with lunch, and with evening meal. 15 mL 11   Magnesium  400 MG CAPS Take 400 mg by mouth at bedtime.     Melatonin 10 MG TABS Take 10 mg by mouth at bedtime.     metFORMIN  (GLUCOPHAGE ) 500 MG tablet Take 500 mg by mouth 2 (two) times daily with a meal.     Multiple Vitamin (MULTIVITAMIN) tablet Take 1 tablet by mouth daily with breakfast.     olmesartan  (BENICAR ) 40 MG tablet Take 1  tablet (40 mg total) by mouth daily. 90 tablet 3   omeprazole (PRILOSEC) 20 MG capsule Take 20 mg by mouth daily.     rosuvastatin  (CRESTOR ) 10 MG tablet Take 10 mg by mouth daily.     SACCHAROMYCES BOULARDII PO Take by mouth in the morning. 2 gummies     traMADol  (ULTRAM ) 50 MG tablet Take 2 tablets (100 mg total) by mouth every 8 (eight) hours as needed for moderate pain. 20 tablet 0   TURMERIC PO Take 1 capsule by mouth every morning.     venlafaxine  XR (EFFEXOR -XR) 37.5 MG 24 hr capsule Take 37.5 mg by mouth daily with breakfast.     No current facility-administered medications for this visit.     Review of Systems    ***.  All other systems reviewed and are otherwise negative except as noted above.    Physical Exam    VS:  There were no vitals taken for this visit. , BMI There is no height or weight on file to calculate BMI.     GEN: Well nourished, well developed, in no acute distress. HEENT: normal. Neck: Supple, no JVD, carotid bruits, or masses. Cardiac: RRR, no murmurs, rubs, or gallops. No clubbing, cyanosis, edema.  Radials/DP/PT 2+ and equal bilaterally.  Respiratory:  Respirations regular and unlabored, clear to auscultation bilaterally. GI: Soft, nontender, nondistended, BS + x 4. MS: no deformity or atrophy. Skin: warm and dry, no rash. Neuro:  Strength and sensation are intact. Psych: Normal affect.  Accessory Clinical Findings    ECG personally reviewed by me today -    - no acute changes.   Lab Results  Component Value Date   WBC 6.8 09/25/2023   HGB 13.5 09/25/2023   HCT 38.4 09/25/2023   MCV 83.3 09/25/2023   PLT 214 09/25/2023   Lab Results  Component Value Date   CREATININE 1.08 (H) 09/25/2023   BUN 16 09/25/2023   NA 141 09/25/2023   K 3.6 09/25/2023   CL 100 09/25/2023   CO2 31 09/25/2023   Lab Results  Component Value Date   ALT 19 09/25/2023   AST 19 09/25/2023   ALKPHOS 84 09/25/2023   BILITOT 0.4 09/25/2023   Lab Results   Component Value Date   CHOL 178 08/08/2021   HDL 40 (L) 08/08/2021   LDLCALC 113 (H) 08/08/2021   TRIG 126 08/08/2021   CHOLHDL 4.5 08/08/2021    Lab Results  Component Value Date   HGBA1C 6.1 (H) 04/11/2023    Assessment & Plan    1.  ***  No BP recorded.  {Refresh Note OR Click here  to enter BP  :1}***   Damien JAYSON Braver, NP 12/22/2023, 7:30 AM

## 2023-12-26 ENCOUNTER — Telehealth: Payer: Self-pay | Admitting: Pulmonary Disease

## 2023-12-26 NOTE — Telephone Encounter (Signed)
 Fax received from Dr. Bonner Hair with [facility] to perform a right reverse total shoulder arthroplasty on patient.  Patient needs surgery clearance. Surgery is pending. Patient was seen on 07/19/23. Office protocol is a risk assessment can be sent to surgeon if patient has been seen in 60 days or less.   Sending to Dr Kara for risk assessment or recommendations if patient needs to be seen in office prior to surgical procedure.

## 2023-12-27 ENCOUNTER — Encounter: Payer: Self-pay | Admitting: Internal Medicine

## 2023-12-28 NOTE — Telephone Encounter (Signed)
 ARISCAT Score for Postoperative Pulmonary Complications  Low risk 1.6% risk of in-hospital post-op pulmonary complications (composite including respiratory failure, respiratory infection, pleural effusion, atelectasis, pneumothorax, bronchospasm, aspiration pneumonitis)  Dorn Chill, MD [facility] Office: [phone]   See Amion for personal pager PCCM on call pager [phone] until 7pm. Please call Elink 7p-7a. [phone]

## 2023-12-28 NOTE — Telephone Encounter (Signed)
 Copy of this encounter was faxed to [facility]

## 2024-01-01 NOTE — Telephone Encounter (Signed)
 Requesting office sent duplicate request. Pt missed her apptt for 12/22/23. Pt reschedules to 01/05/24, though looks like the pt no showed for the 01/05/24 as well.   Pt will need to call the office to reschedule her in office appt for preop clearance.

## 2024-01-02 ENCOUNTER — Ambulatory Visit
Admission: RE | Admit: 2024-01-02 | Discharge: 2024-01-02 | Disposition: A | Discharge status: Home / Self Care | Source: Ambulatory Visit | Attending: Orthopaedic Surgery | Admitting: Orthopaedic Surgery

## 2024-01-02 DIAGNOSIS — M25511 Pain in right shoulder: Secondary | ICD-10-CM

## 2024-01-03 ENCOUNTER — Encounter: Payer: Self-pay | Admitting: *Deleted

## 2024-01-05 ENCOUNTER — Ambulatory Visit: Attending: Emergency Medicine | Admitting: Emergency Medicine

## 2024-01-05 ENCOUNTER — Encounter: Payer: Self-pay | Admitting: Emergency Medicine

## 2024-01-05 VITALS — BP 108/70 | HR 81 | Ht 62.0 in | Wt 174.0 lb

## 2024-01-05 DIAGNOSIS — I10 Essential (primary) hypertension: Secondary | ICD-10-CM

## 2024-01-05 DIAGNOSIS — J984 Other disorders of lung: Secondary | ICD-10-CM

## 2024-01-05 DIAGNOSIS — I480 Paroxysmal atrial fibrillation: Secondary | ICD-10-CM | POA: Diagnosis not present

## 2024-01-05 DIAGNOSIS — E1165 Type 2 diabetes mellitus with hyperglycemia: Secondary | ICD-10-CM | POA: Diagnosis not present

## 2024-01-05 DIAGNOSIS — C8218 Follicular lymphoma grade II, lymph nodes of multiple sites: Secondary | ICD-10-CM

## 2024-01-05 DIAGNOSIS — I340 Nonrheumatic mitral (valve) insufficiency: Secondary | ICD-10-CM | POA: Diagnosis not present

## 2024-01-05 DIAGNOSIS — Z794 Long term (current) use of insulin: Secondary | ICD-10-CM

## 2024-01-05 NOTE — Progress Notes (Signed)
 Cardiology Office Note:    Date:  01/05/2024  ID:  Meghan Alexander, DOB 06-12-1955, MRN [id number] PCP: Henry Ingle, MD  [facility] Providers Cardiologist:  Soyla DELENA Merck, MD       Patient Profile:       Chief Complaint: Preoperative clearance History of Present Illness:  Meghan Alexander is a 68 y.o. 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  She does follow with pulmonology for history of chronic pneumonitis secondary to rituximab  use.  She was diagnosed with chron's disease in 2024.  In June 2024 she was admitted in the setting of nausea/vomiting, AKA, bilateral lower extremity edema.  Echocardiogram 09/2022 showed LVEF 55 to 60%, normal RV function, no RWMA, normal RV, mild mitral valve regurgitation.  She was evaluated in the ED on 10/2022 for shortness of breath and palpitations.  EKG showed atrial fibrillation with RVR.  CT angio of the chest was negative for PE.  She was noted to be mildly hypokalemic.  She converted to normal sinus rhythm and was discharged home in stable condition.  Eliquis  was resumed.  She was seen in office on 3/25 was doing well from a cardiac standpoint.  She was transition from losartan  to olmesartan  40 mg daily given elevated blood pressure.  She was transitioned from metoprolol  to carvedilol .  She was last seen in clinic on 09/07/2023.  Blood pressure has been well-controlled.  Unfortunately over the past 2 weeks she had noticed increased dependent bilateral lower extremity edema.  This coincided with the addition of amlodipine .  Amlodipine  was decreased to 5 mg daily.  Her carvedilol  was increased to 12.5 mg daily.  She is  maintaining sinus rhythm.  She was to follow-up in 2 months.  She is now pending right reverse total shoulder arthroplasty on date TBD   Discussed the use of AI scribe software for clinical note transcription with the patient, who gave verbal consent to proceed.  History of Present Illness Meghan Alexander is a 68 year old female with hypertension and atrial fibrillation who presents for follow-up and surgical clearance for shoulder surgery.  Today she presents by herself.  She was doing well without acute cardiovascular concerns or complaints.  Reports her lower extremity swelling vastly improved since amlodipine  was decreased.  Home blood pressures have also remained fairly well-controlled.  She wears compression socks for residual swelling.  She denies any chest pains, dyspnea, orthopnea, PND.  She denies any symptoms concerning for recurrent atrial fibrillation, denies any palpitations.  Does have a smart watch that monitors her heart rhythm.  Diabetes is well-controlled. She follows up with a hematologist for follicular lymphoma and a lung nodule, reporting her best health status in five years.   Physical activity is limited by shoulder pain and a previous knee replacement, but she attempts to stay active with limb movements and brief stationary bike use without any exertional symptoms.  Review of systems:  Please see the history of present illness. All other systems are reviewed and otherwise negative.      Studies Reviewed:    EKG Interpretation Date/Time:  Friday January 05 2024 10:23:23 EDT Ventricular Rate:  81 PR Interval:  210 QRS Duration:  78 QT Interval:  392 QTC Calculation: 455 R Axis:  46  Text Interpretation: Sinus rhythm with 1st degree A-V block When compared with ECG of 07-Sep-2023 10:38, No significant change was found Confirmed by Rana Dixon [phone]) on 01/05/2024 11:14:03 AM    Echocardiogram 10/06/2022 1. Left ventricular ejection fraction,  by estimation, is 55 to 60%. The  left ventricle has normal function. The left ventricle has no regional  wall motion abnormalities. Indeterminate diastolic filling due to E-A  fusion.   2. Right ventricular systolic function is normal. The right ventricular  size is normal.   3. The mitral valve is normal in structure. Mild mitral valve  regurgitation. No evidence of mitral stenosis.   4. The aortic valve is normal in structure. Aortic valve regurgitation is  not visualized. No aortic stenosis is present.   5. The inferior vena cava is normal in size with greater than 50%  respiratory variability, suggesting right atrial pressure of 3 mmHg.   Echocardiogram 10/08/2021  1. Left ventricular ejection fraction, by estimation, is 55 to 60%. The  left ventricle has normal function. The left ventricle has no regional  wall motion abnormalities. Left ventricular diastolic parameters are  indeterminate.   2. Right ventricular systolic function is normal. The right ventricular  size is normal.   3. The mitral valve is abnormal. Trivial mitral valve regurgitation. No  evidence of mitral stenosis.   4. The aortic valve is tricuspid. Aortic valve regurgitation is not  visualized. No aortic stenosis is present.   5. The inferior vena cava is dilated in size with >50% respiratory  variability, suggesting right atrial pressure of 8 mmHg.  Risk Assessment/Calculations:    CHA2DS2-VASc Score = 4   This indicates a 4.8% annual risk of stroke. The patient's score is based upon: CHF History: 0 HTN History: 1 Diabetes History: 1 Stroke History: 0 Vascular Disease History: 0 Age Score: 1 Gender Score: 1             Physical Exam:   VS:  BP 108/70   Pulse 81   Ht 5' 2 (1.575 m)   Wt 174 lb (78.9 kg)   SpO2 99%   BMI 31.83 kg/m    Wt Readings from Last 3 Encounters:  01/05/24 174 lb (78.9 kg)  09/25/23 172 lb 1.6 oz (78.1 kg)  09/07/23 176 lb (79.8 kg)    GEN: Well nourished, well  developed in no acute distress NECK: No JVD; No carotid bruits CARDIAC:RRR, no murmurs, rubs, gallops RESPIRATORY:  Clear to auscultation without rales, wheezing or rhonchi  ABDOMEN: Soft, non-tender, non-distended EXTREMITIES:  No edema; No acute deformity      Assessment and Plan:  Hypertension Blood pressure today is well-controlled at 108/70 Home blood pressure measurements relatively similar Her lower extremity edema has resolved with decreased dose of amlodipine  - Continue amlodipine  5 mg daily, carvedilol  12.5 mg twice daily, olmesartan  40 mg daily - Continue home BP monitoring  Paroxysmal atrial fibrillation No recent recurrences - EKG today shows she is maintaining NSR - She denies any symptoms concerning for recurrent PAF - Monitoring rhythm with smart watch - Continue carvedilol  12.5 mg twice daily - Continue Eliquis  5 mg twice daily  Mitral valve regurgitation Echocardiogram 09/2022 with LVEF 55 to 60% and mild mitral valve regurgitation - Today she remains fairly asymptomatic, well compensated on exam, and is without any exertional symptoms - No indication for further intervention at this time  T2DM A1c 6.1% on 03/2023 and well-controlled - Managed on metformin  and insulin  - Management per  PCP  Chronic pneumonitis No recent exacerbation - Follows up as needed with pulmonology  Follicular lymphoma - Management per oncology  Preoperative cardiovascular clearance According to the Revised Cardiac Risk Index (RCRI), her Perioperative Risk of Major Cardiac Event is (%): 0.9. Her Functional Capacity in METs is: 5.07 according to the Duke Activity Status Index (DASI). Therefore, based on ACC/AHA guidelines, patient would be at acceptable risk for the planned procedure without further cardiovascular testing. I will route this recommendation to the requesting party via Epic fax function.    Per office protocol, patient can hold Eliquis  for 3 days prior to procedure.      Patient will not need bridging with Lovenox  (enoxaparin ) around procedure.      Dispo:  Return in about 6 months (around 07/04/2024).  Signed, Lum LITTIE Louis, NP

## 2024-01-05 NOTE — Patient Instructions (Signed)
 Medication Instructions:  NO CHANGES  Lab Work: NONE TO BE DONE TODAY.  Testing/Procedures: NONE  Follow-Up: At [facility], you and your health needs are our priority.  As part of our continuing mission to provide you with exceptional heart care, our providers are all part of one team.  This team includes your primary Cardiologist (physician) and Advanced Practice Providers or APPs (Physician Assistants and Nurse Practitioners) who all work together to provide you with the care you need, when you need it.  Your next appointment:   6 MONTHS  Provider:   Soyla DELENA Merck, MD OR Lum Louis, NP

## 2024-01-09 NOTE — OR Nursing (Signed)
 Surgical Instructions    Your procedure is scheduled on Wednesday September 24th.  Report to [facility] at 12:45 P.M., then check in with the Admitting office.  Call this number if you have problems the morning of surgery:  [phone]   If you have any questions prior to your surgery date call [phone]: Open Monday-Friday 8am-4pm If you experience any cold or flu symptoms such as cough, fever, chills, shortness of breath, etc. between now and your scheduled surgery, please notify us  at the above number (the number would be to the preop department, I would prefer we notify surgeons instead of the other way around)   Remember:  Do not eat after midnight the night before your surgery  You may drink clear liquids until 10:15 the morning of your surgery.   Clear liquids allowed are: Water , Non-Citrus Juices (without pulp), Carbonated Beverages, Clear Tea, Black Coffee ONLY (NO MILK, CREAM OR POWDERED CREAMER of any kind), and Gatorade    Take these medicines the morning of surgery with A SIP OF WATER :  Amlodipine  Carvedilol  Venlafaxine  Omeprazole Rosuvastatin  Tramadol  if needed  WHAT DO I DO ABOUT MY DIABETES MEDICATION?   Do not take oral diabetes medicines (pills)- Metformin - the morning of surgery.  THE NIGHT BEFORE SURGERY, take ___________ units of ___________insulin.       THE MORNING OF SURGERY, take _____________ units of __________insulin.  The day of surgery, do not take other diabetes injectables, including Byetta (exenatide), Bydureon (exenatide ER), Victoza (liraglutide), or Trulicity (dulaglutide).  If your CBG is greater than 220 mg/dL, you may take  of your sliding scale (correction) dose of insulin .   HOW TO MANAGE YOUR DIABETES BEFORE AND AFTER SURGERY  Why is it important to control my blood sugar before and after surgery? Improving blood sugar levels before and after surgery helps healing and can limit problems. A way of improving  blood sugar control is eating a healthy diet by:  Eating less sugar and carbohydrates  Increasing activity/exercise  Talking with your doctor about reaching your blood sugar goals High blood sugars (greater than 180 mg/dL) can raise your risk of infections and slow your recovery, so you will need to focus on controlling your diabetes during the weeks before surgery. Make sure that the doctor who takes care of your diabetes knows about your planned surgery including the date and location.  How do I manage my blood sugar before surgery? Check your blood sugar at least 4 times a day, starting 2 days before surgery, to make sure that the level is not too high or low.  Check your blood sugar the morning of your surgery when you wake up and every 2 hours until you get to the Short Stay unit.  If your blood sugar is less than 70 mg/dL, you will need to treat for low blood sugar: Do not take insulin . Treat a low blood sugar (less than 70 mg/dL) with  cup of clear juice (cranberry or apple), 4 glucose tablets, OR glucose gel. Recheck blood sugar in 15 minutes after treatment (to make sure it is greater than 70 mg/dL). If your blood sugar is not greater than 70 mg/dL on recheck, call [phone] for further instructions. Report your blood sugar to the short stay nurse when you get to Short Stay.  If you are admitted to the hospital after surgery: Your blood sugar will be checked by the staff and you will probably be given insulin  after surgery (instead of oral diabetes medicines)  to make sure you have good blood sugar levels. The goal for blood sugar control after surgery is 80-180 mg/dL.   As of today, STOP taking any Aspirin  (unless otherwise instructed by your surgeon) Aleve , Naproxen , Ibuprofen , Motrin , Advil , Goody's, BC's, all herbal medications, fish oil, and all vitamins.           Do not wear jewelry or makeup Do not wear lotions, powders, perfumes/colognes, or deodorant. Do not shave 48  hours prior to surgery.  Men may shave face and neck. Do not bring valuables to the hospital. Do not wear nail polish, gel polish, artificial nails, or any other type of covering on natural nails (fingers and toes) If you have artificial nails or gel coating that need to be removed by a nail salon, please have this removed prior to surgery. Artificial nails or gel coating may interfere with anesthesia's ability to adequately monitor your vital signs.  [facility] is not responsible for any belongings or valuables. .   Do NOT Smoke (Tobacco/Vaping)  24 hours prior to your procedure  If you use a CPAP at night, you may bring your mask for your overnight stay.   Contacts, glasses, hearing aids, dentures or partials may not be worn into surgery, please bring cases for these belongings   For patients admitted to the hospital, discharge time will be determined by your treatment team.   Patients discharged the day of surgery will not be allowed to drive home, and someone needs to stay with them for 24 hours.  SURGICAL WAITING ROOM VISITATION Patients having surgery or a procedure may have no more than 2 support people in the waiting area - these visitors may rotate in the visitor waiting room.   Children under the age of 15 must have an adult with them who is not the patient. If the patient needs to stay at the hospital during part of their recovery, the visitor guidelines for inpatient rooms apply.  PRE-OP VISITATION  Pre-op nurse will coordinate an appropriate time for 1 support person to accompany the patient in pre-op.  This support person may not rotate.  This visitor will be contacted when the time is appropriate for the visitor to come back in the pre-op area.  Please refer to the [facility] website for the visitor guidelines for Inpatients (after your surgery is over and you are in a regular room).       Special instructions:    Oral Hygiene is also important to reduce your risk of  infection.  Remember - BRUSH YOUR TEETH THE MORNING OF SURGERY WITH YOUR REGULAR TOOTHPASTE   [place]- Preparing For Surgery  Before surgery, you can play an important role. Because skin is not sterile, your skin needs to be as free of germs as possible. You can reduce the number of germs on your skin by washing with CHG (chlorahexidine gluconate) Soap before surgery.  CHG is an antiseptic cleaner which kills germs and bonds with the skin to continue killing germs even after washing.     Please do not use if you have an allergy to CHG or antibacterial soaps. If your skin becomes reddened/irritated stop using the CHG.  Do not shave (including legs and underarms) for at least 48 hours prior to first CHG shower. It is OK to shave your face.  Please follow these instructions carefully.     Shower the NIGHT BEFORE SURGERY and the MORNING OF SURGERY with CHG Soap.   If you chose to  wash your hair, wash your hair first as usual with your normal shampoo. After you shampoo, rinse your hair and body thoroughly to remove the shampoo.  Then [employer] and genitals (private parts) with your normal soap and rinse thoroughly to remove soap.  After that Use CHG Soap as you would any other liquid soap. You can apply CHG directly to the skin and wash gently with a scrungie or a clean washcloth.   Apply the CHG Soap to your body ONLY FROM THE NECK DOWN.  Do not use on open wounds or open sores. Avoid contact with your eyes, ears, mouth and genitals (private parts). Wash Face and genitals (private parts)  with your normal soap.   Wash thoroughly, paying special attention to the area where your surgery will be performed.  Thoroughly rinse your body with warm water  from the neck down.  DO NOT shower/wash with your normal soap after using and rinsing off the CHG Soap.  Pat yourself dry with a CLEAN TOWEL.  Wear CLEAN PAJAMAS to bed the night before surgery  Place CLEAN SHEETS on your bed the night before  your surgery  DO NOT SLEEP WITH PETS.   Day of Surgery:  Take a shower with CHG soap. Wear Clean/Comfortable clothing the morning of surgery Do not apply any deodorants/lotions.   Remember to brush your teeth WITH YOUR REGULAR TOOTHPASTE.    If you received a COVID test during your pre-op visit, it is requested that you wear a mask when out in public, stay away from anyone that may not be feeling well, and notify your surgeon if you develop symptoms. If you have been in contact with anyone that has tested positive in the last 10 days, please notify your surgeon.    Please read over the following fact sheets that you were given.

## 2024-01-09 NOTE — OR Nursing (Incomplete)
 SDW CALL  Patient was given pre-op instructions over the phone. The opportunity was given for the patient to ask questions. No further questions asked. Patient verbalized understanding of instructions given.   PCP - Dr. Tanda Rummer Cardiologist - Dr. Wyatt Merck; Lum Louis, NP- Cardiac clearance 01/05/24 Pulmonary: Dr. Dorn Chill Hematology: Dr. Emaline Saran  Chest x-ray - 11/23/22 EKG - 01/05/24 ECHO - 10/06/22   Fasting Blood Sugar -  Checks Blood Sugar _____ times a day  Last dose of GLP1 agonist-   GLP1 instructions:   Blood Thinner Instructions:  hold Eliquis  3 days prior to procedure per cardiology note 01/05/24 Aspirin  Instructions:   Anesthesia review: yes  Patient denies shortness of breath, fever, cough and chest pain over the phone call   All instructions explained to the patient, with a verbal understanding of the material. Patient agrees to go over the instructions while at home for a better understanding.

## 2024-01-09 NOTE — OR Nursing (Signed)
 Inbox message send to Dr. Cristy requesting preop orders.

## 2024-01-10 ENCOUNTER — Encounter (HOSPITAL_COMMUNITY): Payer: Self-pay | Admitting: Orthopaedic Surgery

## 2024-01-10 ENCOUNTER — Other Ambulatory Visit: Payer: Self-pay

## 2024-01-10 NOTE — Progress Notes (Addendum)
 SURGICAL WAITING ROOM VISITATION  Patients having surgery or a procedure may have no more than 2 support people in the waiting area - these visitors may rotate.    Children under the age of 42 must have an adult with them who is not the patient.  Visitors with respiratory illnesses are discouraged from visiting and should remain at home.  If the patient needs to stay at the hospital during part of their recovery, the visitor guidelines for inpatient rooms apply. Pre-op nurse will coordinate an appropriate time for 1 support person to accompany patient in pre-op.  This support person may not rotate.    Please refer to the [facility] website for the visitor guidelines for Inpatients (after your surgery is over and you are in a regular room).       Your procedure is scheduled on: 01/17/24     Report to [facility] Main Entrance    Report to admitting at  1115 AM   Call this number if you have problems the morning of surgery [phone]   Do not eat food :After Midnight. EAT a good healthy snack prior to bedtime.    After Midnight you may have the following liquids until __ 1045____ AM/  DAY OF SURGERY  Water  Non-Citrus Juices (without pulp, NO RED-Apple, White grape, White cranberry) Black Coffee (NO MILK/CREAM OR CREAMERS, sugar ok)  Clear Tea (NO MILK/CREAM OR CREAMERS, sugar ok) regular and decaf                             Plain Jell-O (NO RED)                                           Fruit ices (not with fruit pulp, NO RED)                                     Popsicles (NO RED)                                                               Sports drinks like Gatorade (NO RED)                          If you have questions, please contact your surgeon's office.       Oral Hygiene is also important to reduce your risk of infection.                                    Remember - BRUSH YOUR TEETH THE MORNING OF SURGERY WITH YOUR REGULAR TOOTHPASTE  DENTURES WILL  BE REMOVED PRIOR TO SURGERY PLEASE DO NOT APPLY Poly grip OR ADHESIVES!!!   Do NOT smoke after Midnight   Stop all vitamins and herbal supplements 7 days before surgery.   Take these medicines the morning of surgery with A SIP OF WATER :  amlodipne, carvedilol ,   Metformin - none am of surgery  No sliding scale  am of surgery   DO NOT TAKE ANY ORAL DIABETIC MEDICATIONS DAY OF YOUR SURGERY  Bring CPAP mask and tubing day of surgery.                              You may not have any metal on your body including hair pins, jewelry, and body piercing             Do not wear make-up, lotions, powders, perfumes/cologne, or deodorant  Do not wear nail polish including gel and S&S, artificial/acrylic nails, or any other type of covering on natural nails including finger and toenails. If you have artificial nails, gel coating, etc. that needs to be removed by a nail salon please have this removed prior to surgery or surgery may need to be canceled/ delayed if the surgeon/ anesthesia feels like they are unable to be safely monitored.   Do not shave  48 hours prior to surgery.               Men may shave face and neck.   Do not bring valuables to the hospital. [facility] IS NOT             RESPONSIBLE   FOR VALUABLES.   Contacts, glasses, dentures or bridgework may not be worn into surgery.   Bring small overnight bag day of surgery.   DO NOT BRING YOUR HOME MEDICATIONS TO THE HOSPITAL. PHARMACY WILL DISPENSE MEDICATIONS LISTED ON YOUR MEDICATION LIST TO YOU DURING YOUR ADMISSION IN THE HOSPITAL!    Patients discharged on the day of surgery will not be allowed to drive home.  Someone NEEDS to stay with you for the first 24 hours after anesthesia.   Special Instructions: Bring a copy of your healthcare power of attorney and living will documents the day of surgery if you haven't scanned them before.              Please read over the following fact sheets you were given: IF YOU HAVE  QUESTIONS ABOUT YOUR PRE-OP INSTRUCTIONS PLEASE CALL [phone].   If you received a COVID test during your pre-op visit  it is requested that you wear a mask when out in public, stay away from anyone that may not be feeling well and notify your surgeon if you develop symptoms. If you test positive for Covid or have been in contact with anyone that has tested positive in the last 10 days please notify you surgeon.      Pre-operative 5 CHG Bath Instructions        Start on 01/13/2024   You can play a key role in reducing the risk of infection after surgery. Your skin needs to be as free of germs as possible. You can reduce the number of germs on your skin by washing with CHG (chlorhexidine  gluconate) soap before surgery. CHG is an antiseptic soap that kills germs and continues to kill germs even after washing.   DO NOT use if you have an allergy to chlorhexidine /CHG or antibacterial soaps. If your skin becomes reddened or irritated, stop using the CHG and notify one of our RNs at [phone].   Please shower with the CHG soap starting 4 days before surgery using the following schedule:     Please keep in mind the following:  DO NOT shave, including legs and underarms, starting the day of your first shower.   You may shave your face at  any point before/day of surgery.  Place clean sheets on your bed the day you start using CHG soap. Use a clean washcloth (not used since being washed) for each shower. DO NOT sleep with pets once you start using the CHG.   CHG Shower Instructions:  If you choose to wash your hair and private area, wash first with your normal shampoo/soap.  After you use shampoo/soap, rinse your hair and body thoroughly to remove shampoo/soap residue.  Turn the water  OFF and apply about 3 tablespoons (45 ml) of CHG soap to a CLEAN washcloth.  Apply CHG soap ONLY FROM YOUR NECK DOWN TO YOUR TOES (washing for 3-5 minutes)  DO NOT use CHG soap on face, private areas, open wounds,  or sores.  Pay special attention to the area where your surgery is being performed.  If you are having back surgery, having someone wash your back for you may be helpful. Wait 2 minutes after CHG soap is applied, then you may rinse off the CHG soap.  Pat dry with a clean towel  Put on clean clothes/pajamas   If you choose to wear lotion, please use ONLY the CHG-compatible lotions on the back of this paper.     Additional instructions for the day of surgery: DO NOT APPLY any lotions, deodorants, cologne, or perfumes.   Put on clean/comfortable clothes.  Brush your teeth.  Ask your nurse before applying any prescription medications to the skin.      CHG Compatible Lotions   Aveeno Moisturizing lotion  Cetaphil Moisturizing Cream  Cetaphil Moisturizing Lotion  Clairol Herbal Essence Moisturizing Lotion, Dry Skin  Clairol Herbal Essence Moisturizing Lotion, Extra Dry Skin  Clairol Herbal Essence Moisturizing Lotion, Normal Skin  Curel Age Defying Therapeutic Moisturizing Lotion with Alpha Hydroxy  Curel Extreme Care Body Lotion  Curel Soothing Hands Moisturizing Hand Lotion  Curel Therapeutic Moisturizing Cream, Fragrance-Free  Curel Therapeutic Moisturizing Lotion, Fragrance-Free  Curel Therapeutic Moisturizing Lotion, Original Formula  Eucerin Daily Replenishing Lotion  Eucerin Dry Skin Therapy Plus Alpha Hydroxy Crme  Eucerin Dry Skin Therapy Plus Alpha Hydroxy Lotion  Eucerin Original Crme  Eucerin Original Lotion  Eucerin Plus Crme Eucerin Plus Lotion  Eucerin TriLipid Replenishing Lotion  Keri Anti-Bacterial Hand Lotion  Keri Deep Conditioning Original Lotion Dry Skin Formula Softly Scented  Keri Deep Conditioning Original Lotion, Fragrance Free Sensitive Skin Formula  Keri Lotion Fast Absorbing Fragrance Free Sensitive Skin Formula  Keri Lotion Fast Absorbing Softly Scented Dry Skin Formula  Keri Original Lotion  Keri Skin Renewal Lotion Keri Silky Smooth Lotion   Keri Silky Smooth Sensitive Skin Lotion  Nivea Body Creamy Conditioning Oil  Nivea Body Extra Enriched Lotion  Nivea Body Original Lotion  Nivea Body Sheer Moisturizing Lotion Nivea Crme  Nivea Skin Firming Lotion  NutraDerm 30 Skin Lotion  NutraDerm Skin Lotion  NutraDerm Therapeutic Skin Cream  NutraDerm Therapeutic Skin Lotion  ProShield Protective Hand Cream  Provon moisturizing lotion  [place]- Preparing for Total Shoulder Arthroplasty   Do on 01/15/24 and 01/16/24  Before surgery, you can play an important role. Because skin is not sterile, your skin needs to be as free of germs as possible. You can reduce the number of germs on your skin by using the following products. Benzoyl Peroxide Gel Reduces the number of germs present on the skin Applied twice a day to shoulder area starting two days before surgery    ==================================================================  Please follow these instructions carefully:  BENZOYL PEROXIDE 5% GEL  Please do not use if you have an allergy to benzoyl peroxide.   If your skin becomes reddened/irritated stop using the benzoyl peroxide.  Starting two days before surgery, apply as follows: Apply benzoyl peroxide in the morning and at night. Apply after taking a shower. If you are not taking a shower clean entire shoulder front, back, and side along with the armpit with a clean wet washcloth.  Place a quarter-sized dollop on your shoulder and rub in thoroughly, making sure to cover the front, back, and side of your shoulder, along with the armpit.   2 days before ____ AM   ____ PM              1 day before ____ AM   ____ PM                         Do this twice a day for two days.  (Last application is the night before surgery, AFTER using the CHG soap as described below).  Do NOT apply benzoyl peroxide gel on the day of surgery.

## 2024-01-10 NOTE — Progress Notes (Addendum)
 Anesthesia Review:  ERE:Mnwjoi Roberts - LOV 01/09/2024.   Cardiologist : Madison Fountain,NP LOV 01/05/24  Pulm- [place]-LOV 07/05/23   PPM/ ICD: Device Orders: Rep Notified:  Chest x-ray : 11/23/22  CT Chest- 03/27/23  EKG : 01/05/24  Echo : 10/06/22  Stress test: Cardiac Cath :   Activity level:  can do a flight of stairs without difficutly  Sleep Study/ CPAP : hx of had surgery no longer has  Fasting Blood Sugar :      / Checks Blood Sugar -- times a day:     DM- type 2- Freestyle Libre  Hgba1c- 01/11/24- 5.9  Metformin - none am of surgery  Sliding Scale- none am of surgery pt reports at phone call she only has to use approx every 2 months   Blood Thinner/ Instructions /Last Dose:   Eliquis - last dose on 01/13/2024  ASA / Instructions/ Last Dose :  Completed med hx and preop instructions via phone call on 01/10/24.  PT to come in on 01/11/2024 to pick up hibiclens  and benzoyl peroxide gel and preop instructions and to obtain lab work prior to surgery.     PT came in for preop labs on 01/11/24.  Labs completed, instructons, soap and peroxide gel given.  Consent signed. Vital signs completed.

## 2024-01-11 ENCOUNTER — Encounter (HOSPITAL_COMMUNITY)
Admission: RE | Admit: 2024-01-11 | Discharge: 2024-01-11 | Disposition: A | Discharge status: Home / Self Care | Source: Ambulatory Visit | Attending: Orthopaedic Surgery | Admitting: Orthopaedic Surgery

## 2024-01-11 VITALS — BP 118/80 | HR 82 | Temp 98.4°F | Resp 16 | Ht 62.0 in | Wt 169.0 lb

## 2024-01-11 DIAGNOSIS — T451X5A Adverse effect of antineoplastic and immunosuppressive drugs, initial encounter: Secondary | ICD-10-CM | POA: Diagnosis not present

## 2024-01-11 DIAGNOSIS — I480 Paroxysmal atrial fibrillation: Secondary | ICD-10-CM | POA: Insufficient documentation

## 2024-01-11 DIAGNOSIS — J704 Drug-induced interstitial lung disorders, unspecified: Secondary | ICD-10-CM | POA: Diagnosis not present

## 2024-01-11 DIAGNOSIS — I10 Essential (primary) hypertension: Secondary | ICD-10-CM | POA: Diagnosis not present

## 2024-01-11 DIAGNOSIS — Z8572 Personal history of non-Hodgkin lymphomas: Secondary | ICD-10-CM | POA: Diagnosis not present

## 2024-01-11 DIAGNOSIS — Z01812 Encounter for preprocedural laboratory examination: Secondary | ICD-10-CM | POA: Diagnosis present

## 2024-01-11 DIAGNOSIS — K5090 Crohn's disease, unspecified, without complications: Secondary | ICD-10-CM | POA: Insufficient documentation

## 2024-01-11 DIAGNOSIS — E119 Type 2 diabetes mellitus without complications: Secondary | ICD-10-CM | POA: Insufficient documentation

## 2024-01-11 DIAGNOSIS — Z01818 Encounter for other preprocedural examination: Secondary | ICD-10-CM

## 2024-01-11 DIAGNOSIS — M19011 Primary osteoarthritis, right shoulder: Secondary | ICD-10-CM | POA: Insufficient documentation

## 2024-01-11 LAB — HEMOGLOBIN A1C
Hgb A1c MFr Bld: 5.9 % — ABNORMAL HIGH (ref 4.8–5.6)
Mean Plasma Glucose: 122.63 mg/dL

## 2024-01-11 LAB — SURGICAL PCR SCREEN
MRSA, PCR: NEGATIVE
Staphylococcus aureus: NEGATIVE

## 2024-01-11 LAB — CBC
HCT: 41.5 % (ref 36.0–46.0)
Hemoglobin: 13.6 g/dL (ref 12.0–15.0)
MCH: 28.5 pg (ref 26.0–34.0)
MCHC: 32.8 g/dL (ref 30.0–36.0)
MCV: 87 fL (ref 80.0–100.0)
Platelets: 253 K/uL (ref 150–400)
RBC: 4.77 MIL/uL (ref 3.87–5.11)
RDW: 12.3 % (ref 11.5–15.5)
WBC: 7.6 K/uL (ref 4.0–10.5)
nRBC: 0 % (ref 0.0–0.2)

## 2024-01-11 LAB — BASIC METABOLIC PANEL WITH GFR
Anion gap: 14 (ref 5–15)
BUN: 14 mg/dL (ref 8–23)
CO2: 26 mmol/L (ref 22–32)
Calcium: 9.7 mg/dL (ref 8.9–10.3)
Chloride: 100 mmol/L (ref 98–111)
Creatinine, Ser: 0.91 mg/dL (ref 0.44–1.00)
GFR, Estimated: >60 mL/min (ref >60)
Glucose, Bld: 145 mg/dL — ABNORMAL HIGH (ref 70–99)
Potassium: 4.3 mmol/L (ref 3.5–5.1)
Sodium: 140 mmol/L (ref 135–145)

## 2024-01-12 NOTE — Anesthesia Preprocedure Evaluation (Addendum)
 Anesthesia Evaluation  Patient identified by MRN, date of birth, ID band Patient awake    Reviewed: Allergy & Precautions, NPO status , Patient's Chart, lab work & pertinent test results, reviewed documented beta blocker date and time   History of Anesthesia Complications (+) PONV and history of anesthetic complications  Airway Mallampati: II  TM Distance: >3 FB Neck ROM: Full    Dental  (+) Teeth Intact, Dental Advisory Given   Pulmonary neg pulmonary ROS   Pulmonary exam normal breath sounds clear to auscultation       Cardiovascular hypertension, Pt. on home beta blockers Normal cardiovascular exam+ dysrhythmias Atrial Fibrillation  Rhythm:Regular Rate:Normal     Neuro/Psych negative neurological ROS     GI/Hepatic Neg liver ROS,GERD  Medicated,,  Endo/Other  diabetes, Type 2, Oral Hypoglycemic Agents    Renal/GU negative Renal ROS     Musculoskeletal  (+) Arthritis ,    Abdominal   Peds  Hematology  (+) Blood dyscrasia (Eliquis )   Anesthesia Other Findings Day of surgery medications reviewed with the patient.  Reproductive/Obstetrics                              Anesthesia Physical Anesthesia Plan  ASA: 3  Anesthesia Plan: General   Post-op Pain Management: Regional block*, Tylenol  PO (pre-op)* and Gabapentin  PO (pre-op)*   Induction: Intravenous  PONV Risk Score and Plan: 4 or greater and Dexamethasone , Propofol  infusion and Ondansetron   Airway Management Planned: Oral ETT  Additional Equipment:   Intra-op Plan:   Post-operative Plan: Extubation in OR  Informed Consent: I have reviewed the patients History and Physical, chart, labs and discussed the procedure including the risks, benefits and alternatives for the proposed anesthesia with the patient or authorized representative who has indicated his/her understanding and acceptance.     Dental advisory given  Plan  Discussed with: CRNA  Anesthesia Plan Comments: (See PAT note 01/11/2024)         Anesthesia Quick Evaluation

## 2024-01-12 NOTE — Progress Notes (Signed)
 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 Bonner Cristy.   Per cardiology preoperative evaluation 01/05/24, According to the Revised Cardiac Risk Index (RCRI), her Perioperative Risk of Major Cardiac Event is (%): 0.9. Her Functional Capacity in METs is: 5.07 according to the Duke Activity Status Index (DASI). Therefore, based on ACC/AHA guidelines, patient would be at acceptable risk for the planned procedure without further cardiovascular testing. I will route this recommendation to the requesting party via Epic fax function.   Per pulmonology preoperative evaluation 12/28/2023, Low risk 1.6% risk of in-hospital post-op pulmonary complications (composite including respiratory failure, respiratory infection, pleural effusion, atelectasis, pneumothorax, bronchospasm, aspiration pneumonitis)   VS: BP 118/80   Pulse 82   Temp 36.9 C (Oral)   Resp 16   Ht 5' 2 (1.575 m)   Wt 76.7 kg   SpO2 98%   BMI 30.91 kg/m   PROVIDERS: Henry Ingle, MD is PCP   Cardiologist:  Soyla DELENA Merck, MD   Kara Dorn NOVAK, MD is Pulmonologist  LABS: Labs reviewed: Acceptable for surgery. (all labs ordered are listed, but only abnormal results are displayed)  Labs Reviewed  BASIC METABOLIC PANEL WITH GFR - Abnormal; Notable for the following components:      Result Value   Glucose, Bld 145 (*)    All other components within normal limits  HEMOGLOBIN A1C - Abnormal;  Notable for the following components:   Hgb A1c MFr Bld 5.9 (*)    All other components within normal limits  SURGICAL PCR SCREEN  CBC     IMAGES:   EKG:   CV: Echo 10/06/2022 1. Left ventricular ejection fraction, by estimation, is 55 to 60%. The  left ventricle has normal function. The left ventricle has no regional  wall motion abnormalities. Indeterminate diastolic filling due to E-A  fusion.   2. Right ventricular systolic function is normal. The right ventricular  size is normal.   3. The mitral valve is normal in structure. Mild mitral valve  regurgitation. No evidence of mitral stenosis.   4. The aortic valve is normal in structure. Aortic valve regurgitation is  not visualized. No aortic stenosis is present.   5. The inferior vena cava is normal in size with greater than 50%  respiratory variability, suggesting right atrial pressure of 3 mmHg.  Past Medical History:  Diagnosis Date   A-fib [facility])    Achalasia    Allergy    year around allergies   Anemia    Arthritis    fingers, knees, neck, back-osteoarthristis   Bronchitis    hx of   Complication of anesthesia    Crohn's disease (HCC)    Dysrhythmia    a. 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   Hypertension    Pneumonia    hx of    PONV (postoperative nausea and vomiting)    Thyroid  nodule    mulitple nodules that are being monitored  Type 2 diabetes mellitus (HCC) 08/02/2021    Past Surgical History:  Procedure Laterality Date   ABDOMINAL HYSTERECTOMY     ADENOIDECTOMY     ANKLE SURGERY Right    BIOPSY  03/10/2022   Procedure: BIOPSY;  Surgeon: Rosalie Kitchens, MD;  Location: [facility] ENDOSCOPY;  Service: Gastroenterology;;   BRONCHIAL BRUSHINGS  08/03/2021   Procedure: BRONCHIAL BRUSHINGS;  Surgeon: Shelah Lamar RAMAN, MD;  Location[facility] ENDOSCOPY;  Service: Cardiopulmonary;;   BRONCHIAL WASHINGS  08/03/2021   Procedure: BRONCHIAL WASHINGS;  Surgeon: Shelah Lamar RAMAN, MD;  Location: [facility] ENDOSCOPY;  Service: Cardiopulmonary;;   cosmetic surgeries     brow lift and lipsuction on abdominal, breast implants   ESOPHAGEAL MANOMETRY N/A 12/02/2015   Procedure: ESOPHAGEAL MANOMETRY (EM);  Surgeon: Elsie Cree, MD;  Location: [facility] ENDOSCOPY;  Service: Endoscopy;  Laterality: N/A;   ESOPHAGOGASTRODUODENOSCOPY N/A 12/02/2015   Procedure: ESOPHAGOGASTRODUODENOSCOPY (EGD);  Surgeon: Elsie Cree, MD;  Location: [facility] ENDOSCOPY;  Service: Endoscopy;  Laterality: N/A;   FLEXIBLE SIGMOIDOSCOPY N/A 03/10/2022   Procedure: FLEXIBLE SIGMOIDOSCOPY;  Surgeon: Rosalie Kitchens, MD;  Location: [facility] ENDOSCOPY;  Service: Gastroenterology;  Laterality: N/A;   IR KYPHO THORACIC WITH BONE BIOPSY  11/22/2022   IR RADIOLOGIST EVAL & MGMT  11/08/2022   IR RADIOLOGIST EVAL & MGMT  12/19/2022   IR VERTEBROPLASTY CERV/THOR BX INC UNI/BIL INC/INJECT/IMAGING  10/05/2022   lymph node removal left leg     cat scratch surgery    ORIF ANKLE FRACTURE Right 09/03/2021   Procedure: OPEN REDUCTION INTERNAL FIXATION (ORIF) ANKLE FRACTURE;  Surgeon: Edna Toribio LABOR, MD;  Location: [facility] OR;  Service: Orthopedics;  Laterality: Right;   THROAT SURGERY     UPPP for sleep apnea   TONSILLECTOMY     TOTAL KNEE ARTHROPLASTY Right 04/24/2023   Procedure: TOTAL KNEE ARTHROPLASTY;  Surgeon: Edna Toribio LABOR, MD;  Location: [facility]S;  Service: Orthopedics;  Laterality: Right;   VIDEO BRONCHOSCOPY  08/03/2021   Procedure: VIDEO BRONCHOSCOPY WITH FLUORO;  Surgeon: Shelah Lamar RAMAN, MD;  Location: [facility] ENDOSCOPY;  Service: Cardiopulmonary;;    MEDICATIONS:  amLODipine  (NORVASC ) 5 MG tablet   amoxicillin  (AMOXIL ) 500 MG tablet   apixaban  (ELIQUIS ) 5 MG TABS tablet   BENADRYL  ALLERGY 25 MG tablet   carvedilol  (COREG ) 12.5 MG tablet   cholecalciferol (VITAMIN D3) 25 MCG (1000 UNIT) tablet   Continuous Blood Gluc Sensor (FREESTYLE LIBRE 2 SENSOR) MISC   glucose blood (ACCU-CHEK GUIDE) test strip   HUMALOG   KWIKPEN 100 UNIT/ML KwikPen   Magnesium  400 MG CAPS   Melatonin 10 MG TABS   metFORMIN  (GLUCOPHAGE ) 500 MG tablet   Multiple Vitamin (MULTIVITAMIN) tablet   olmesartan  (BENICAR ) 40 MG tablet   omeprazole (PRILOSEC) 20 MG capsule   rosuvastatin  (CRESTOR ) 10 MG tablet   SACCHAROMYCES BOULARDII PO   STEQEYMA 90 MG/ML SOSY   traMADol  (ULTRAM ) 50 MG tablet   TURMERIC PO   venlafaxine  XR (EFFEXOR -XR) 37.5 MG 24 hr capsule   No current facility-administered medications for this encounter.   Harlene Hoots Ward, PA-C [facility] Pre-Surgical Testing [phone]

## 2024-01-16 NOTE — H&P (Signed)
 PREOPERATIVE H&P  Chief Complaint: osteoarthritis of right shoulder  HPI: Meghan Alexander is a 68 y.o. female who is scheduled for Procedure(s): ARTHROPLASTY, SHOULDER, TOTAL, REVERSE.   Patient has struggled with shoulder pain for many months. She has tried and failed multiple injections. She is unable to raise her arm overhead.   Symptoms are rated as moderate to severe, and have been worsening.  This is significantly impairing activities of daily living.    Please see clinic note for further details on this patient's care.    She has elected for surgical management.   Past Medical History:  Diagnosis Date   A-fib [facility])    Achalasia    Allergy    year around allergies   Anemia    Arthritis    fingers, knees, neck, back-osteoarthristis   Bronchitis    hx of   Complication of anesthesia    Crohn's disease (HCC)    Dysrhythmia    a. 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   Hypertension    Pneumonia    hx of    PONV (postoperative nausea and vomiting)    Thyroid  nodule    mulitple nodules that are being monitored   Type 2 diabetes mellitus (HCC) 08/02/2021   Past Surgical History:  Procedure Laterality Date   ABDOMINAL HYSTERECTOMY     ADENOIDECTOMY     ANKLE SURGERY Right    BIOPSY  03/10/2022   Procedure: BIOPSY;  Surgeon: Rosalie Kitchens, MD;  Location: [facility] ENDOSCOPY;  Service: Gastroenterology;;   BRONCHIAL BRUSHINGS  08/03/2021   Procedure: BRONCHIAL BRUSHINGS;  Surgeon: Shelah Lamar RAMAN, MD;  Location: [facility] ENDOSCOPY;  Service: Cardiopulmonary;;   BRONCHIAL WASHINGS  08/03/2021   Procedure: BRONCHIAL WASHINGS;  Surgeon: Shelah Lamar RAMAN, MD;  Location: [facility] ENDOSCOPY;  Service: Cardiopulmonary;;   cosmetic surgeries     brow lift and lipsuction on abdominal, breast implants   ESOPHAGEAL MANOMETRY N/A 12/02/2015   Procedure: ESOPHAGEAL MANOMETRY (EM);  Surgeon: Elsie Cree, MD;  Location: [facility] ENDOSCOPY;  Service:  Endoscopy;  Laterality: N/A;   ESOPHAGOGASTRODUODENOSCOPY N/A 12/02/2015   Procedure: ESOPHAGOGASTRODUODENOSCOPY (EGD);  Surgeon: Elsie Cree, MD;  Location: [facility] ENDOSCOPY;  Service: Endoscopy;  Laterality: N/A;   FLEXIBLE SIGMOIDOSCOPY N/A 03/10/2022   Procedure: FLEXIBLE SIGMOIDOSCOPY;  Surgeon: Rosalie Kitchens, MD;  Location: [facility] ENDOSCOPY;  Service: Gastroenterology;  Laterality: N/A;   IR KYPHO THORACIC WITH BONE BIOPSY  11/22/2022   IR RADIOLOGIST EVAL & MGMT  11/08/2022   IR RADIOLOGIST EVAL & MGMT  12/19/2022   IR VERTEBROPLASTY CERV/THOR BX INC UNI/BIL INC/INJECT/IMAGING  10/05/2022   lymph node removal left leg     cat scratch surgery    ORIF ANKLE FRACTURE Right 09/03/2021   Procedure: OPEN REDUCTION INTERNAL FIXATION (ORIF) ANKLE FRACTURE;  Surgeon: Edna Toribio LABOR, MD;  Location: [facility] OR;  Service: Orthopedics;  Laterality: Right;   THROAT SURGERY     UPPP for sleep apnea   TONSILLECTOMY     TOTAL KNEE ARTHROPLASTY Right 04/24/2023   Procedure: TOTAL KNEE ARTHROPLASTY;  Surgeon: Edna Toribio LABOR, MD;  Location: [facility] ORS;  Service: Orthopedics;  Laterality: Right;   VIDEO BRONCHOSCOPY  08/03/2021   Procedure: VIDEO BRONCHOSCOPY WITH FLUORO;  Surgeon: Shelah Lamar RAMAN, MD;  Location: [facility] ENDOSCOPY;  Service: Cardiopulmonary;;   Social History   Socioeconomic History   Marital status: Married    Spouse name: Not on file   Number of children: Not  on file   Years of education: Not on file   Highest education level: Not on file  Occupational History   Not on file  Tobacco Use   Smoking status: Never   Smokeless tobacco: Never  Vaping Use   Vaping status: Never Used  Substance and Sexual Activity   Alcohol  use: No    Alcohol /week: 0.0 standard drinks of alcohol    Drug use: No   Sexual activity: Yes  Other Topics Concern   Not on file  Social History Narrative   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, newspaper at [employer]   Never smoker never drinker    No drugs   Multiple allergies   Lives at home with her husband of 8 years since 2008   Social Drivers of Health   Financial Resource Strain: Not on file  Food Insecurity: No Food Insecurity (04/24/2023)   Hunger Vital Sign    Worried About Running Out of Food in the Last Year: Never true    Ran Out of Food in the Last Year: Never true  Transportation Needs: No Transportation Needs (04/24/2023)   PRAPARE - Administrator, Civil Service (Medical): No    Lack of Transportation (Non-Medical): No  Physical Activity: Not on file  Stress: Not on file  Social Connections: Socially Isolated (04/24/2023)   Social Connection and Isolation Panel    Frequency of Communication with Friends and Family: Once a week    Frequency of Social Gatherings with Friends and Family: Never    Attends Religious Services: Never    Diplomatic Services operational officer: No    Attends Engineer, structural: Never    Marital Status: Married   Family History  Problem Relation Age of Onset   Lung cancer Father        1982 died from it   Hernia Mother    Breast cancer Maternal Aunt        multiple    Breast cancer Maternal Grandmother    Breast cancer Paternal Grandmother    Breast cancer Cousin        cousins multiple    Colon cancer Neg Hx    Colon polyps Neg Hx    Rectal cancer Neg Hx    Stomach cancer Neg Hx    Allergies  Allergen Reactions   Latex Other (See Comments)    Blisters / skin peeling   Atenolol  Cough   Other Itching, Swelling and Other (See Comments)    DermaPlast or ANY SURGICAL GLUE Used after surgery - caused swelling, itching, and wound broke open   Prior to Admission medications   Medication Sig Start Date End Date Taking? Authorizing Provider  amLODipine  (NORVASC ) 5 MG tablet Take 1 tablet (5 mg total) by mouth daily. 09/07/23 01/05/24  Daneen Damien BROCKS, NP  amoxicillin  (AMOXIL ) 500 MG tablet Take 500 mg by mouth 4 (four) times daily. 12/28/23   [provider]  apixaban  (ELIQUIS ) 5 MG TABS tablet Take 1 tablet (5 mg total) by mouth 2 (two) times daily. 11/16/23   Acharya, Gayatri A, MD  BENADRYL  ALLERGY 25 MG tablet Take 25 mg by mouth at bedtime.    [provider]  carvedilol  (COREG ) 12.5 MG tablet Take 1 tablet (12.5 mg total) by mouth 2 (two) times daily. 12/18/23   Daneen Damien BROCKS, NP  cholecalciferol (VITAMIN D3) 25 MCG (1000 UNIT) tablet Take 1,000 Units by mouth daily.    [provider]  Continuous Blood Gluc Sensor (FREESTYLE LIBRE 2 SENSOR) MISC Inject 1 Device into the skin every 14 (fourteen) days.    [provider]  glucose blood (ACCU-CHEK GUIDE) test strip Use to test blood sugars up to 4 times daily as directed. 08/09/21   Milissa Tod PARAS, MD  HUMALOG  KARY 100 UNIT/ML KwikPen Inject 5 Units into the skin with breakfast, with lunch, and with evening meal. 10/11/21   Austin Ade, MD  Magnesium  400 MG CAPS Take 400 mg by mouth at bedtime.    [provider]  Melatonin 10 MG TABS Take 10 mg by mouth at bedtime.    [provider]  metFORMIN  (GLUCOPHAGE ) 500 MG tablet Take 500 mg by mouth 2 (two) times daily with a meal. 07/23/19   [provider]  Multiple Vitamin (MULTIVITAMIN) tablet Take 1 tablet by mouth daily with breakfast.    [provider]  olmesartan  (BENICAR ) 40 MG tablet Take 1 tablet (40 mg total) by mouth daily. 07/18/23   Daneen Damien BROCKS, NP  omeprazole (PRILOSEC) 20 MG capsule Take 20 mg by mouth daily. 12/13/22   [provider]  rosuvastatin  (CRESTOR ) 10 MG tablet Take 10 mg by mouth daily.    [provider]  SACCHAROMYCES BOULARDII PO Take by mouth in the morning. 2 gummies    [provider]  STEQEYMA 90 MG/ML SOSY every 8 (eight) weeks. 11/27/23   [provider]  traMADol  (ULTRAM ) 50 MG tablet Take 2 tablets (100 mg total) by mouth every 8 (eight) hours as needed for moderate pain. 10/11/22   Adhikari, Amrit, MD   TURMERIC PO Take 1 capsule by mouth every morning.    [provider]  venlafaxine  XR (EFFEXOR -XR) 37.5 MG 24 hr capsule Take 37.5 mg by mouth daily with breakfast.    [provider]    ROS: All other systems have been reviewed and were otherwise negative with the exception of those mentioned in the HPI and as above.  Physical Exam: General: Alert, no acute distress Cardiovascular: No pedal edema Respiratory: No cyanosis, no use of accessory musculature GI: No organomegaly, abdomen is soft and non-tender Skin: No lesions in the area of chief complaint Neurologic: Sensation intact distally Psychiatric: Patient is competent for consent with normal mood and affect Lymphatic: No axillary or cervical lymphadenopathy  MUSCULOSKELETAL:  Active forward elevation of right shoulder to 30 degrees. Passive to 160. Gross weakness with cuff testing. Positive drop arm test.  Imaging: MRI demonstrates a large to massive tear of the supraspinatus with retraction to mid humeral head. There is intrasubstance splitting of the tendon and concern for poor tendon quality.   BMI: Estimated body mass index is 30.91 kg/m as calculated from the following:   Height as of 01/11/24: 5' 2 (1.575 m).   Weight as of 01/11/24: 76.7 kg.  Lab Results  Component Value Date   ALBUMIN 4.5 09/25/2023   Diabetes:   Patient has a diagnosis of diabetes,  Lab Results  Component Value Date   HGBA1C 5.9 (H) 01/11/2024   Smoking Status:   reports that she has never smoked. She has never used smokeless tobacco.     Assessment: osteoarthritis of right shoulder  Plan: Plan for Procedure(s): ARTHROPLASTY, SHOULDER, TOTAL, REVERSE  The risks benefits and alternatives were discussed with the patient including but not limited to the risks of nonoperative treatment, versus surgical intervention including infection, bleeding, nerve injury,  blood clots, cardiopulmonary complications, morbidity,  mortality, among others, and  they were willing to proceed.   We additionally specifically discussed risks of axillary nerve injury, infection, periprosthetic fracture, continued pain and longevity of implants prior to beginning procedure.    Patient will be closely monitored in PACU for medical stabilization and pain control. If found stable in PACU, patient may be discharged home with outpatient follow-up. If any concerns regarding patient's stabilization patient will be admitted for observation after surgery. The patient is planning to be discharged home with outpatient PT.   The patient acknowledged the explanation, agreed to proceed with the plan and consent was signed.   Operative Plan: Right reverse total shoulder arthroplasty Discharge Medications: standard DVT Prophylaxis: aspirin  Physical Therapy: outpatient Special Discharge needs: Sling. IceMan   Aleck LOISE Stalling, PA-C  01/16/2024 8:51 PM

## 2024-01-17 ENCOUNTER — Other Ambulatory Visit: Payer: Self-pay

## 2024-01-17 ENCOUNTER — Encounter (HOSPITAL_COMMUNITY)
Admission: RE | Disposition: A | Discharge status: Home / Self Care | Payer: Self-pay | Source: Home / Self Care | Attending: Orthopaedic Surgery

## 2024-01-17 ENCOUNTER — Ambulatory Visit (HOSPITAL_COMMUNITY): Payer: Self-pay | Admitting: Physician Assistant

## 2024-01-17 ENCOUNTER — Ambulatory Visit (HOSPITAL_COMMUNITY)
Admission: RE | Admit: 2024-01-17 | Discharge: 2024-01-17 | Disposition: A | Discharge status: Home / Self Care | Attending: Orthopaedic Surgery | Admitting: Orthopaedic Surgery

## 2024-01-17 ENCOUNTER — Encounter (HOSPITAL_COMMUNITY): Payer: Self-pay | Admitting: Orthopaedic Surgery

## 2024-01-17 ENCOUNTER — Ambulatory Visit (HOSPITAL_COMMUNITY): Admitting: Certified Registered"

## 2024-01-17 ENCOUNTER — Ambulatory Visit (HOSPITAL_COMMUNITY)

## 2024-01-17 DIAGNOSIS — E119 Type 2 diabetes mellitus without complications: Secondary | ICD-10-CM | POA: Insufficient documentation

## 2024-01-17 DIAGNOSIS — K219 Gastro-esophageal reflux disease without esophagitis: Secondary | ICD-10-CM | POA: Insufficient documentation

## 2024-01-17 DIAGNOSIS — Z79899 Other long term (current) drug therapy: Secondary | ICD-10-CM | POA: Diagnosis not present

## 2024-01-17 DIAGNOSIS — Z7984 Long term (current) use of oral hypoglycemic drugs: Secondary | ICD-10-CM | POA: Diagnosis not present

## 2024-01-17 DIAGNOSIS — I10 Essential (primary) hypertension: Secondary | ICD-10-CM

## 2024-01-17 DIAGNOSIS — M19011 Primary osteoarthritis, right shoulder: Secondary | ICD-10-CM | POA: Diagnosis present

## 2024-01-17 DIAGNOSIS — Z7901 Long term (current) use of anticoagulants: Secondary | ICD-10-CM | POA: Diagnosis not present

## 2024-01-17 DIAGNOSIS — I4891 Unspecified atrial fibrillation: Secondary | ICD-10-CM | POA: Insufficient documentation

## 2024-01-17 DIAGNOSIS — M12811 Other specific arthropathies, not elsewhere classified, right shoulder: Secondary | ICD-10-CM

## 2024-01-17 DIAGNOSIS — Z01818 Encounter for other preprocedural examination: Secondary | ICD-10-CM

## 2024-01-17 HISTORY — DX: Anemia, unspecified: D64.9

## 2024-01-17 HISTORY — PX: REVERSE SHOULDER ARTHROPLASTY: SHX5054

## 2024-01-17 LAB — GLUCOSE, CAPILLARY
Glucose-Capillary: 124 mg/dL — ABNORMAL HIGH (ref 70–99)
Glucose-Capillary: 97 mg/dL (ref 70–99)

## 2024-01-17 SURGERY — ARTHROPLASTY, SHOULDER, TOTAL, REVERSE
Anesthesia: General | Site: Shoulder | Laterality: Right

## 2024-01-17 MED ORDER — FENTANYL CITRATE (PF) 100 MCG/2ML IJ SOLN
INTRAMUSCULAR | Status: DC | PRN
  Administered 2024-01-17: 50 ug via INTRAVENOUS

## 2024-01-17 MED ORDER — PHENYLEPHRINE HCL-NACL 20-0.9 MG/250ML-% IV SOLN
INTRAVENOUS | Status: DC | PRN
  Administered 2024-01-17: 25 ug/min via INTRAVENOUS

## 2024-01-17 MED ORDER — BUPIVACAINE HCL (PF) 0.5 % IJ SOLN
INTRAMUSCULAR | Status: DC | PRN
  Administered 2024-01-17: 10 mL via PERINEURAL

## 2024-01-17 MED ORDER — ONDANSETRON HCL 4 MG/2ML IJ SOLN
INTRAMUSCULAR | Status: AC
  Filled 2024-01-17: qty 2

## 2024-01-17 MED ORDER — MIDAZOLAM HCL 2 MG/2ML IJ SOLN
1.0000 mg | INTRAMUSCULAR | Status: DC
  Administered 2024-01-17: 1 mg via INTRAVENOUS
  Filled 2024-01-17: qty 2

## 2024-01-17 MED ORDER — FENTANYL CITRATE (PF) 100 MCG/2ML IJ SOLN
INTRAMUSCULAR | Status: AC
  Filled 2024-01-17: qty 2

## 2024-01-17 MED ORDER — GABAPENTIN 300 MG PO CAPS
300.0000 mg | ORAL_CAPSULE | Freq: Once | ORAL | Status: AC
  Administered 2024-01-17: 300 mg via ORAL
  Filled 2024-01-17: qty 1

## 2024-01-17 MED ORDER — LIDOCAINE HCL (PF) 2 % IJ SOLN
INTRAMUSCULAR | Status: DC | PRN
  Administered 2024-01-17: 60 mg via INTRADERMAL

## 2024-01-17 MED ORDER — PROPOFOL 10 MG/ML IV BOLUS
INTRAVENOUS | Status: DC | PRN
Start: 2024-01-17 — End: 2024-01-17
  Administered 2024-01-17: 25 ug/kg/min via INTRAVENOUS
  Administered 2024-01-17: 140 mg via INTRAVENOUS

## 2024-01-17 MED ORDER — CHLORHEXIDINE GLUCONATE 0.12 % MT SOLN
15.0000 mL | Freq: Once | OROMUCOSAL | Status: AC
  Administered 2024-01-17: 15 mL via OROMUCOSAL

## 2024-01-17 MED ORDER — SUGAMMADEX SODIUM 200 MG/2ML IV SOLN
INTRAVENOUS | Status: AC
  Filled 2024-01-17: qty 2

## 2024-01-17 MED ORDER — FENTANYL CITRATE PF 50 MCG/ML IJ SOSY
50.0000 ug | PREFILLED_SYRINGE | INTRAMUSCULAR | Status: DC
  Administered 2024-01-17: 50 ug via INTRAVENOUS
  Filled 2024-01-17: qty 2

## 2024-01-17 MED ORDER — LIDOCAINE HCL (PF) 2 % IJ SOLN
INTRAMUSCULAR | Status: AC
Start: 2024-01-17 — End: 2024-01-17
  Filled 2024-01-17: qty 5

## 2024-01-17 MED ORDER — ORAL CARE MOUTH RINSE: 15.0000 mL | Freq: Once | OROMUCOSAL | Status: AC

## 2024-01-17 MED ORDER — ONDANSETRON HCL 4 MG PO TABS: 4.0000 mg | ORAL_TABLET | Freq: Three times a day (TID) | ORAL | 0 refills | Status: AC | PRN

## 2024-01-17 MED ORDER — FENTANYL CITRATE PF 50 MCG/ML IJ SOSY: 25.0000 ug | PREFILLED_SYRINGE | INTRAMUSCULAR | Status: DC | PRN

## 2024-01-17 MED ORDER — SUGAMMADEX SODIUM 200 MG/2ML IV SOLN
INTRAVENOUS | Status: DC | PRN
  Administered 2024-01-17: 200 mg via INTRAVENOUS

## 2024-01-17 MED ORDER — BUPIVACAINE LIPOSOME 1.3 % IJ SUSP
INTRAMUSCULAR | Status: DC | PRN
  Administered 2024-01-17: 10 mL via PERINEURAL

## 2024-01-17 MED ORDER — VANCOMYCIN HCL 1 G IV SOLR
INTRAVENOUS | Status: DC | PRN
  Administered 2024-01-17: 1000 mg via TOPICAL

## 2024-01-17 MED ORDER — ACETAMINOPHEN 500 MG PO TABS
1000.0000 mg | ORAL_TABLET | Freq: Once | ORAL | Status: AC
  Administered 2024-01-17: 1000 mg via ORAL
  Filled 2024-01-17: qty 2

## 2024-01-17 MED ORDER — ONDANSETRON HCL 4 MG/2ML IJ SOLN: 4.0000 mg | Freq: Once | INTRAMUSCULAR | Status: DC | PRN

## 2024-01-17 MED ORDER — ROCURONIUM BROMIDE 10 MG/ML (PF) SYRINGE
PREFILLED_SYRINGE | INTRAVENOUS | Status: AC
Start: 2024-01-17 — End: 2024-01-17
  Filled 2024-01-17: qty 10

## 2024-01-17 MED ORDER — 0.9 % SODIUM CHLORIDE (POUR BTL) OPTIME
TOPICAL | Status: DC | PRN
  Administered 2024-01-17: 1000 mL

## 2024-01-17 MED ORDER — DEXAMETHASONE SODIUM PHOSPHATE 10 MG/ML IJ SOLN
INTRAMUSCULAR | Status: DC | PRN
Start: 2024-01-17 — End: 2024-01-17
  Administered 2024-01-17: 4 mg via INTRAVENOUS

## 2024-01-17 MED ORDER — CEFAZOLIN SODIUM-DEXTROSE 2-4 GM/100ML-% IV SOLN
2.0000 g | INTRAVENOUS | Status: AC
  Administered 2024-01-17: 2 g via INTRAVENOUS
  Filled 2024-01-17: qty 100

## 2024-01-17 MED ORDER — ONDANSETRON HCL 4 MG/2ML IJ SOLN
INTRAMUSCULAR | Status: DC | PRN
  Administered 2024-01-17: 4 mg via INTRAVENOUS

## 2024-01-17 MED ORDER — PROPOFOL 10 MG/ML IV BOLUS
INTRAVENOUS | Status: AC
Start: 2024-01-17 — End: 2024-01-17
  Filled 2024-01-17: qty 20

## 2024-01-17 MED ORDER — OXYCODONE HCL 5 MG PO TABS: ORAL_TABLET | ORAL | 0 refills | Status: AC

## 2024-01-17 MED ORDER — ROCURONIUM BROMIDE 10 MG/ML (PF) SYRINGE
PREFILLED_SYRINGE | INTRAVENOUS | Status: DC | PRN
  Administered 2024-01-17: 60 mg via INTRAVENOUS

## 2024-01-17 MED ORDER — DEXAMETHASONE SODIUM PHOSPHATE 10 MG/ML IJ SOLN
INTRAMUSCULAR | Status: AC
  Filled 2024-01-17: qty 1

## 2024-01-17 MED ORDER — LACTATED RINGERS IV SOLN
INTRAVENOUS | Status: DC
Start: 2024-01-17 — End: 2024-01-17

## 2024-01-17 MED ORDER — EPHEDRINE SULFATE-NACL 50-0.9 MG/10ML-% IV SOSY
PREFILLED_SYRINGE | INTRAVENOUS | Status: DC | PRN
  Administered 2024-01-17: 10 mg via INTRAVENOUS
  Administered 2024-01-17: 5 mg via INTRAVENOUS
  Administered 2024-01-17: 10 mg via INTRAVENOUS

## 2024-01-17 MED ORDER — ACETAMINOPHEN 500 MG PO TABS: 1000.0000 mg | ORAL_TABLET | Freq: Three times a day (TID) | ORAL | 0 refills | Status: AC

## 2024-01-17 MED ORDER — VANCOMYCIN HCL 1000 MG IV SOLR
INTRAVENOUS | Status: AC
  Filled 2024-01-17: qty 20

## 2024-01-17 MED ORDER — STERILE WATER FOR IRRIGATION IR SOLN
Status: DC | PRN
  Administered 2024-01-17: 2000 mL

## 2024-01-17 MED ORDER — TRANEXAMIC ACID-NACL 1000-0.7 MG/100ML-% IV SOLN
1000.0000 mg | INTRAVENOUS | Status: AC
  Administered 2024-01-17: 1000 mg via INTRAVENOUS
  Filled 2024-01-17: qty 100

## 2024-01-17 MED ORDER — MELOXICAM 15 MG PO TABS: 15.0000 mg | ORAL_TABLET | Freq: Every day | ORAL | 0 refills | Status: AC

## 2024-01-17 MED ORDER — SODIUM CHLORIDE 0.9 % IR SOLN
Status: DC | PRN
  Administered 2024-01-17: 1000 mL

## 2024-01-17 MED ORDER — INSULIN ASPART 100 UNIT/ML IJ SOLN: 0.0000 [IU] | INTRAMUSCULAR | Status: DC | PRN

## 2024-01-17 SURGICAL SUPPLY — 54 items
BAG COUNTER SPONGE SURGICOUNT (BAG) ×1 IMPLANT
BASEPLATE GLENOID RSA 3X25 0D (Shoulder) IMPLANT
BIT DRILL 3.2 PERIPHERAL SCREW (BIT) IMPLANT
BLADE SAW SGTL 73X25 THK (BLADE) ×1 IMPLANT
CHLORAPREP W/TINT 26 (MISCELLANEOUS) ×2 IMPLANT
CLSR STERI-STRIP ANTIMIC 1/2X4 (GAUZE/BANDAGES/DRESSINGS) ×1 IMPLANT
COOLER ICEMAN CLASSIC (MISCELLANEOUS) ×1 IMPLANT
COVER BACK TABLE 60X90IN (DRAPES) ×1 IMPLANT
COVER SURGICAL LIGHT HANDLE (MISCELLANEOUS) ×1 IMPLANT
DRAPE C-ARM 42X120 X-RAY (DRAPES) IMPLANT
DRAPE INCISE IOBAN 66X45 STRL (DRAPES) ×1 IMPLANT
DRAPE POUCH INSTRU U-SHP 10X18 (DRAPES) ×1 IMPLANT
DRAPE SHEET LG 3/4 BI-LAMINATE (DRAPES) ×2 IMPLANT
DRAPE SURG ORHT 6 SPLT 77X108 (DRAPES) ×2 IMPLANT
DRSG AQUACEL AG ADV 3.5X 6 (GAUZE/BANDAGES/DRESSINGS) ×1 IMPLANT
ELECT BLADE TIP CTD 4 INCH (ELECTRODE) ×1 IMPLANT
ELECT PENCIL ROCKER SW 15FT (MISCELLANEOUS) ×1 IMPLANT
ELECT REM PT RETURN 15FT ADLT (MISCELLANEOUS) ×1 IMPLANT
FACESHIELD WRAPAROUND OR TEAM (MASK) ×3 IMPLANT
GLOVE BIO SURGEON STRL SZ 6.5 (GLOVE) ×2 IMPLANT
GLOVE BIOGEL PI IND STRL 6.5 (GLOVE) ×1 IMPLANT
GLOVE BIOGEL PI IND STRL 8 (GLOVE) ×1 IMPLANT
GLOVE ECLIPSE 8.0 STRL XLNG CF (GLOVE) ×2 IMPLANT
GLOVE SURG SYN 6.5 PF PI BL (GLOVE) IMPLANT
GOWN STRL REUS W/ TWL LRG LVL3 (GOWN DISPOSABLE) ×2 IMPLANT
GUIDEWIRE GLENOID 2.5X220 (WIRE) IMPLANT
INSERT REVERSED HUMERAL SIZE 1 (Orthopedic Implant) IMPLANT
KIT BASIN OR (CUSTOM PROCEDURE TRAY) ×1 IMPLANT
KIT STABILIZATION SHOULDER (MISCELLANEOUS) ×1 IMPLANT
KIT TURNOVER KIT A (KITS) ×1 IMPLANT
MANIFOLD NEPTUNE II (INSTRUMENTS) ×1 IMPLANT
NS IRRIG 1000ML POUR BTL (IV SOLUTION) ×1 IMPLANT
PACK SHOULDER (CUSTOM PROCEDURE TRAY) ×1 IMPLANT
PAD COLD SHLDR WRAP-ON (PAD) ×1 IMPLANT
PIN GUIDE 3X75 SHOULDER (PIN) IMPLANT
RESTRAINT HEAD UNIVERSAL NS (MISCELLANEOUS) ×1 IMPLANT
SCREW 5.0X38 SMALL F/PERFORM (Screw) IMPLANT
SCREW 5.5X22 (Screw) IMPLANT
SCREW BONE THREAD 6.5X35 (Screw) IMPLANT
SET HNDPC FAN SPRY TIP SCT (DISPOSABLE) ×1 IMPLANT
SPHERE GLENOID LAT REV 36 (Joint) IMPLANT
SPONGE T-LAP 4X18 ~~LOC~~+RFID (SPONGE) IMPLANT
STEM HUMERAL PLUS SHORT SZ2+ (Orthopedic Implant) IMPLANT
STRIP CLOSURE SKIN 1/2X4 (GAUZE/BANDAGES/DRESSINGS) IMPLANT
SUCTION TUBE FRAZIER 12FR DISP (SUCTIONS) IMPLANT
SUT ETHIBOND 2 V 37 (SUTURE) ×1 IMPLANT
SUT ETHIBOND NAB CT1 #1 30IN (SUTURE) ×1 IMPLANT
SUT MNCRL AB 4-0 PS2 18 (SUTURE) ×1 IMPLANT
SUT VIC AB 0 CT1 36 (SUTURE) IMPLANT
SUT VIC AB 3-0 SH 27X BRD (SUTURE) ×1 IMPLANT
SUTURE FIBERWR #5 38 CONV NDL (SUTURE) ×2 IMPLANT
TOWEL OR 17X26 10 PK STRL BLUE (TOWEL DISPOSABLE) ×1 IMPLANT
TUBE SUCTION HIGH CAP CLEAR NV (SUCTIONS) ×1 IMPLANT
WATER STERILE IRR 1000ML POUR (IV SOLUTION) ×2 IMPLANT

## 2024-01-17 NOTE — Anesthesia Procedure Notes (Addendum)
 Procedure Name: Intubation Date/Time: 01/17/2024 11:13 AM  Performed by: Metta Andrea NOVAK, CRNAPre-anesthesia Checklist: Patient identified, Emergency Drugs available, Suction available, Patient being monitored and Timeout performed Patient Re-evaluated:Patient Re-evaluated prior to induction Oxygen Delivery Method: Circle system utilized Preoxygenation: Pre-oxygenation with 100% oxygen Induction Type: IV induction Ventilation: Mask ventilation without difficulty Laryngoscope Size: Mac and 4 Grade View: Grade I Tube type: Oral Tube size: 7.0 mm Number of attempts: 1 Airway Equipment and Method: Stylet Placement Confirmation: ETT inserted through vocal cords under direct vision, positive ETCO2 and breath sounds checked- equal and bilateral Secured at: 22 cm Tube secured with: Tape Dental Injury: Teeth and Oropharynx as per pre-operative assessment

## 2024-01-17 NOTE — Transfer of Care (Signed)
 Immediate Anesthesia Transfer of Care Note  Patient: Meghan Alexander  Procedure(s) Performed: ARTHROPLASTY, SHOULDER, TOTAL, REVERSE (Right: Shoulder)  Patient Location: PACU  Anesthesia Type:General  Level of Consciousness: awake, drowsy, and patient cooperative  Airway & Oxygen Therapy: Patient Spontanous Breathing and Patient connected to face mask oxygen  Post-op Assessment: Report given to RN and Post -op Vital signs reviewed and stable  Post vital signs: Reviewed and stable  Last Vitals:  Vitals Value Taken Time  BP 145/79 01/17/24 12:30  Temp    Pulse 81 01/17/24 12:31  Resp 15 01/17/24 12:31  SpO2 100 % 01/17/24 12:31  Vitals shown include unfiled device data.  Last Pain:  Vitals:   01/17/24 0941  TempSrc: Oral  PainSc:          Complications: No notable events documented.

## 2024-01-17 NOTE — Interval H&P Note (Signed)
 All questions answered, patient wants to proceed with procedure. ? ?

## 2024-01-17 NOTE — Anesthesia Procedure Notes (Signed)
 Anesthesia Regional Block: Interscalene brachial plexus block   Pre-Anesthetic Checklist: , timeout performed,  Correct Patient, Correct Site, Correct Laterality,  Correct Procedure, Correct Position, site marked,  Risks and benefits discussed,  Surgical consent,  Pre-op evaluation,  At surgeon's request and post-op pain management  Laterality: Right  Prep: chloraprep       Needles:  Injection technique: Single-shot  Needle Type: Echogenic Stimulator Needle     Needle Length: 9cm  Needle Gauge: 22     Additional Needles:   Procedures:,,,, ultrasound used (permanent image in chart),,    Narrative:  Start time: 01/17/2024 10:43 AM End time: 01/17/2024 10:51 AM Injection made incrementally with aspirations every 5 mL.  Performed by: Personally  Anesthesiologist: Corinne Garnette BRAVO, MD  Additional Notes: Functioning IV was confirmed and monitors were applied.  A 90mm 22ga echogenic stimulator needle was used. Sterile prep and drape, hand hygiene, and sterile gloves were used.  Negative aspiration and negative test dose prior to incremental administration of local anesthetic. The patient tolerated the procedure well.  Ultrasound guidance: relevent anatomy identified, needle position confirmed, local anesthetic spread visualized around nerve(s), vascular puncture avoided.  Image printed for medical record.

## 2024-01-17 NOTE — Anesthesia Postprocedure Evaluation (Signed)
 Anesthesia Post Note  Patient: Meghan Alexander  Procedure(s) Performed: ARTHROPLASTY, SHOULDER, TOTAL, REVERSE (Right: Shoulder)     Patient location during evaluation: PACU Anesthesia Type: General Level of consciousness: awake and alert Pain management: pain level controlled Vital Signs Assessment: post-procedure vital signs reviewed and stable Respiratory status: spontaneous breathing, nonlabored ventilation, respiratory function stable and patient connected to nasal cannula oxygen Cardiovascular status: blood pressure returned to baseline and stable Postop Assessment: no apparent nausea or vomiting Anesthetic complications: no   No notable events documented.  Last Vitals:  Vitals:   01/17/24 1300 01/17/24 1400  BP: (!) 147/88 133/79  Pulse: 84 86  Resp: 16 17  Temp:    SpO2: 92% 92%    Last Pain:  Vitals:   01/17/24 1400  TempSrc:   PainSc: 0-No pain                 [name]

## 2024-01-17 NOTE — Op Note (Signed)
 Orthopaedic Surgery Operative Note (CSN: [id number])  [name]  1956-01-10 Date of Surgery: 01/17/2024   Diagnoses:  Right cuff arthropathy  Procedure: Reverse lateralized total Shoulder Arthroplasty   Operative Finding Successful completion of planned procedure.  Reasonable bone quality, good stability throughout the case, tug test normal   Post-operative plan: The patient will be NWB in sling.  The patient will be will be discharged from PACU if continues to be stable as was plan prior to surgery.  DVT prophylaxis Aspirin  81 mg twice daily for 6 weeks.  Pain control with PRN pain medication preferring oral medicines.  Follow up plan will be scheduled in approximately 7 days for incision check and XR.  Physical therapy to start immediately.  Implants: Tornier perform humeral size 2+ stem, 0 polyethylene, 36+3 glenosphere with a 25+3 baseplate, 45 center screw and 2 peripheral screws  Post-Op Diagnosis: Same Surgeons:Primary: Cristy Bonner DASEN, MD Assistants:Caroline McBane, PA-C Location: [facility] ROOM 06 Anesthesia: General with Exparel  Interscalene Antibiotics: Ancef  2g preop, Vancomycin  1000mg  locally Tourniquet time: None Estimated Blood Loss: 100 Complications: None Specimens: None Implants: Implant Name Type Inv. Item Serial No. Manufacturer Lot No. LRB No. Used Action  SPHERE GLENOID LAT REV 36 - [id number] Joint SPHERE GLENOID LAT REV 36 [id number] [employer]  Right 1 Implanted  BASEPLATE GLENOID RSA 3X25 0D - [id number] Shoulder BASEPLATE GLENOID RSA 3X25 0D [id number] [employer]  Right 1 Implanted  INSERT REVERSED HUMERAL SIZE 1 - [id number] Orthopedic Implant INSERT REVERSED HUMERAL SIZE 1 [id number] [employer]  Right 1 Implanted  STEM HUMERAL PLUS SHORT SZ2+ - [id number] Orthopedic Implant STEM HUMERAL PLUS SHORT SZ2+ [id number] [employer]  Right 1 Implanted  SCREW 5.5X22 - [id number] Screw SCREW 5.5X22  [employer]  Right 1 Implanted  SCREW 5.0X38 SMALL F/PERFORM -  [id number] Screw SCREW 5.0X38 SMALL F/PERFORM  [employer]  Right 1 Implanted  SCREW BONE THREAD 6.5X35 - [id number] Screw SCREW BONE THREAD 6.5X35  [employer]  Right 1 Implanted    Indications for Surgery:   Meghan Alexander is a 68 y.o. female with cuff arthropathy.  Benefits and risks of operative and nonoperative management were discussed prior to surgery with patient/guardian(s) and informed consent form was completed.  Infection and need for further surgery were discussed as was prosthetic stability and cuff issues.  We additionally specifically discussed risks of axillary nerve injury, infection, periprosthetic fracture, continued pain and longevity of implants prior to beginning procedure.      Procedure:   The patient was identified in the preoperative holding area where the surgical site was marked. Block placed by anesthesia with exparel .  The patient was taken to the OR where a procedural timeout was called and the above noted anesthesia was induced.  The patient was positioned beachchair on allen table with spider arm positioner.  Preoperative antibiotics were dosed.  The patient's right shoulder was prepped and draped in the usual sterile fashion.  A second preoperative timeout was called.       Standard deltopectoral approach was performed with a #10 blade. We dissected down to the subcutaneous tissues and the cephalic vein was taken laterally with the deltoid. Clavipectoral fascia was incised in line with the incision. Deep retractors were placed. The long of the biceps tendon was identified and there was significant tenosynovitis present.  Tenodesis was performed to the pectoralis tendon with #2 Ethibond. The remaining biceps was followed up into the rotator interval where it was released.   The subscapularis was taken  down in a full thickness layer with capsule along the humeral neck extending inferiorly around the humeral head. We continued releasing the capsule directly off of the  osteophytes inferiorly all the way around the corner. This allowed us  to dislocate the humeral head.   There were osteophytes along the inferior humeral neck. The osteophytes were removed with an osteotome and a rongeur.  Osteophytes were removed with a rongeur and an osteotome and the anatomic neck was well visualized.     A humeral cutting guide was used extra medullary with a pin to help control version. The version was set at 20 of retroversion. Humeral osteotomy was performed with an oscillating saw. The head fragment was passed off the back table.  A cut protector plate was placed.  The subscapularis was again identified and immediately we took care to palpate the axillary nerve anteriorly and verify its position with gentle palpation as well as the tug test.  We then released the SGHL with bovie cautery prior to placing a curved mayo at the junction of the anterior glenoid well above the axillary nerve and bluntly dissecting the subscapularis from the capsule.  We then carefully protected the axillary nerve as we gently released the inferior capsule to fully mobilize the subscapularis.  An anterior deltoid retractor was then placed as well as a small Hohmann retractor superiorly.   The remaining labrum was removed circumferentially taking great care not to disrupt the posterior capsule.   The glenoid drill guide was placed and used to drill a guide pin in the center, inferior position. The glenoid face was then reamed concentrically over the guide wire. The center hole was drilled over the guidepin in a near anatomic angle of version. Next the glenoid vault was drilled back and the vault was intact.  We were able to place our baseplate and 2 peripheral locking screws.  The base plate was screwed into the glenoid vault obtaining secure fixation. We next placed superior and inferior locking screws for additional fixation.  Glenosphere was attached as above size list.  The center screw was  tightened.  We turned attention back to the humeral side. The cut protector was removed.  We then reamed for the central ball of the implant.  We sized as above.  Trial was obtained good stability and we selected real implants.  The shoulder was trialed.  There was good ROM in all planes and the shoulder was stable with no inferior translation.  The real humeral implants were opened after again confirming sizes.  The trial was removed. #5 Fiberwire x4 sutures passed through the humeral neck for subscap repair. The humeral component was press-fit obtaining a secure fit. The joint was reduced and thoroughly irrigated with pulsatile lavage. Subscap was repaired back with #5 Fiberwire sutures through bone tunnels. Hemostasis was obtained. The deltopectoral interval was reapproximated with #1 Ethibond. The subcutaneous tissues were closed with 2-0 Vicryl and the skin was closed with running monocryl.    The wounds were cleaned and dried and an Aquacel dressing was placed. The drapes taken down. The arm was placed into sling with abduction pillow. Patient was awakened, extubated, and transferred to the recovery room in stable condition. There were no intraoperative complications. The sponge, needle, and attention counts were  correct at the end of the case.     Aleck Stalling, PA-C, present and scrubbed throughout the case, critical for completion in a timely fashion, and for retraction, instrumentation, closure.

## 2024-01-17 NOTE — Discharge Instructions (Addendum)
 Bonner Hair MD, MPH Aleck Stalling, PA-C [facility] [address] [phone] (tel)   [phone] (fax)   POST-OPERATIVE INSTRUCTIONS - TOTAL SHOULDER REPLACEMENT    WOUND CARE You may leave the operative dressing in place until your follow-up appointment. KEEP THE INCISIONS CLEAN AND DRY. There may be a small amount of fluid/bleeding leaking at the surgical site. This is normal after surgery.  If it fills with liquid or blood please call us  immediately to change it for you. Use the provided ice machine or Ice packs as often as possible for the first 3-4 days, then as needed for pain relief.   Keep a layer of cloth or a shirt between your skin and the cooling unit to prevent frost bite as it can get very cold.  SHOWERING: - You may shower on Post-Op Day #2.  - The dressing is water  resistant but do not scrub it as it may start to peel up.   - You may remove the sling for showering - Gently pat the area dry.  - Do not soak the shoulder in water .  - Do not go swimming in the pool or ocean until your incision has completely healed (about 4-6 weeks after surgery) - KEEP THE INCISIONS CLEAN AND DRY.  EXERCISES Wear the sling at all times  You may remove the sling for showering, but keep the arm across the chest or in a secondary sling.    Accidental/Purposeful External Rotation and shoulder flexion (reaching behind you) is to be avoided at all costs for the first month. It is ok to come out of your sling if your are sitting and have assistance for eating.   Do not lift anything heavier than 1 pound until we discuss it further in clinic.  It is normal for your fingers/hand to become more swollen after surgery and discolored from bruising.   This will resolve over the first few weeks usually after surgery. Please continue to ambulate and do not stay sitting or lying for too long.  Perform foot and wrist pumps to assist in circulation.  PHYSICAL  THERAPY - You will begin physical therapy soon after surgery (unless otherwise specified) - Please call to set up an appointment, if you do not already have one  - Let our office if there are any issues with scheduling your therapy  - You have a physical therapy appointment scheduled at [facility] (across the hall from our office) on 9/29   REGIONAL ANESTHESIA (NERVE BLOCKS) The anesthesia team may have performed a nerve block for you this is a great tool used to minimize pain.   The block may start wearing off overnight (between 8-24 hours postop) When the block wears off, your pain may go from nearly zero to the pain you would have had postop without the block. This is an abrupt transition but nothing dangerous is happening.   This can be a challenging period but utilize your as needed pain medications to try and manage this period. We suggest you use the pain medication the first night prior to going to bed, to ease this transition.  You may take an extra dose of narcotic when this happens if needed   POST-OP MEDICATIONS- Multimodal approach to pain control In general your pain will be controlled with a combination of substances.  Prescriptions unless otherwise discussed are electronically sent to your pharmacy.  This is a carefully made plan we use to minimize narcotic use.  Meloxicam  - Anti-inflammatory medication taken on a scheduled basis Acetaminophen  - Non-narcotic pain medicine taken on a scheduled basis  Oxycodone  - This is a strong narcotic, to be used only on an "as needed" basis for SEVERE pain. Zofran  -  take as needed for nausea  You may resume your Eliquis  24 hours after surgery  FOLLOW-UP If you develop a Fever (>101.5), Redness or Drainage from the surgical incision site, please call our office to arrange for an evaluation. Please call the office to schedule a follow-up appointment for a wound check, 7-10 days post-operatively.  IF YOU HAVE ANY QUESTIONS, PLEASE FEEL  FREE TO CALL OUR OFFICE.  HELPFUL INFORMATION  Your arm will be in a sling following surgery. You will be in this sling for the next 4 weeks.   You may be more comfortable sleeping in a semi-seated position the first few nights following surgery.  Keep a pillow propped under the elbow and forearm for comfort.  If you have a recliner type of chair it might be beneficial.  If not that is fine too, but it would be helpful to sleep propped up with pillows behind your operated shoulder as well under your elbow and forearm.  This will reduce pulling on the suture lines.  When dressing, put your operative arm in the sleeve first.  When getting undressed, take your operative arm out last.  Loose fitting, button-down shirts are recommended.  In most states it is against the law to drive while your arm is in a sling. And certainly against the law to drive while taking narcotics.  You may return to work/school in the next couple of days when you feel up to it. Desk work and typing in the sling is fine.  We suggest you use the pain medication the first night prior to going to bed, in order to ease any pain when the anesthesia wears off. You should avoid taking pain medications on an empty stomach as it will make you nauseous.  You should wean off your narcotic medicines as soon as you are able.     Most patients will be off narcotics before their first postop appointment.   Do not drink alcoholic beverages or take illicit drugs when taking pain medications.  Pain medication may make you constipated.  Below are a few solutions to try in this order: Decrease the amount of pain medication if you aren't having pain. Drink lots of decaffeinated fluids. Drink prune juice and/or each dried prunes  If the first 3 don't work start with additional solutions Take Colace - an over-the-counter stool softener Take Senokot - an over-the-counter laxative Take Miralax  - a stronger over-the-counter  laxative   Dental Antibiotics:  We require dental prophylaxis for 2 years after a shoulder replacement  Contact your surgeon for an antibiotic prescription, prior to your dental procedure.   For more information including helpful videos and documents visit our website:   [email or web address]

## 2024-01-18 ENCOUNTER — Encounter (HOSPITAL_COMMUNITY): Payer: Self-pay | Admitting: Orthopaedic Surgery

## 2024-02-04 ENCOUNTER — Other Ambulatory Visit: Payer: Self-pay | Admitting: Internal Medicine

## 2024-02-04 DIAGNOSIS — I480 Paroxysmal atrial fibrillation: Secondary | ICD-10-CM

## 2024-02-12 ENCOUNTER — Other Ambulatory Visit: Payer: Self-pay | Admitting: Nurse Practitioner

## 2024-02-12 DIAGNOSIS — I10 Essential (primary) hypertension: Secondary | ICD-10-CM

## 2024-02-13 ENCOUNTER — Ambulatory Visit (INDEPENDENT_AMBULATORY_CARE_PROVIDER_SITE_OTHER): Admitting: Internal Medicine

## 2024-02-13 ENCOUNTER — Encounter: Payer: Self-pay | Admitting: Internal Medicine

## 2024-02-13 VITALS — BP 122/80 | HR 91 | Ht 62.0 in | Wt 177.2 lb

## 2024-02-13 DIAGNOSIS — E042 Nontoxic multinodular goiter: Secondary | ICD-10-CM

## 2024-02-13 NOTE — Progress Notes (Signed)
 Name: Meghan Alexander  MRN/ DOB: [id number], 06/07/1955    Age/ Sex: 68 y.o., female    PCP: Henry Ingle, MD   Reason for Endocrinology Evaluation: MNG     Date of Initial Endocrinology Evaluation: 02/13/2024     HPI: Meghan Alexander is a 68 y.o. 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 The patient presented for initial endocrinology clinic visit on 02/13/2024 for consultative assistance with her MNG.   Pt follows with oncology follicular lymphoma, Dx 2020. S/P  Chemotherapy 2020  Pt had an incidental finding of thyroid  nodule on CT imagine in 2024 , which prompted a thyroid  ultrasound revealing multiple thyroid  nodules 11/2022 requiring surveillance.   Sister with thyroid  disease ETTER Gavel' disease)   SUBJECTIVE:    Today (02/13/24):  Meghan Alexander is here for follow-up of multinodular goiter.  Patient continues to follow-up with cardiology for history of paroxysmal A-fib and HTN Patient also follows with pulmonary for pulmonary nodules Patient follows with oncology for Hx of follicular lymphoma  She is s/p reverse shoulder arthroplasty in September, 2025 Patient has been noted weight gain since her last visit here No local neck swelling  She is on stelara  for crohn's disease, she continues with diarrhea  Has rare palpitations  No tremors  Has rare palpitations, has A.Fib    She is on Biotin    HISTORY:  Past Medical History:  Past Medical History:  Diagnosis Date   A-fib (HCC)    Achalasia    Allergy    year around allergies   Anemia    Arthritis    fingers, knees, neck, back-osteoarthristis   Bronchitis    hx of   Complication of anesthesia    Crohn's disease (HCC)    Dysrhythmia    a. 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   Hypertension    Pneumonia    hx of    PONV (postoperative nausea  and vomiting)    Thyroid  nodule    mulitple nodules that are being monitored   Type 2 diabetes mellitus (HCC) 08/02/2021   Past Surgical History:  Past Surgical History:  Procedure Laterality Date   ABDOMINAL HYSTERECTOMY     ADENOIDECTOMY     ANKLE SURGERY Right    BIOPSY  03/10/2022   Procedure: BIOPSY;  Surgeon: Rosalie Kitchens, MD;  Location: [facility] ENDOSCOPY;  Service: Gastroenterology;;   BRONCHIAL BRUSHINGS  08/03/2021   Procedure: BRONCHIAL BRUSHINGS;  Surgeon: Shelah Lamar RAMAN, MD;  Location: [facility] ENDOSCOPY;  Service: Cardiopulmonary;;   BRONCHIAL WASHINGS  08/03/2021   Procedure: BRONCHIAL WASHINGS;  Surgeon: Shelah Lamar RAMAN, MD;  Location: [facility] ENDOSCOPY;  Service: Cardiopulmonary;;   cosmetic surgeries     brow lift and lipsuction on abdominal, breast implants   ESOPHAGEAL MANOMETRY N/A 12/02/2015   Procedure: ESOPHAGEAL MANOMETRY (EM);  Surgeon: Elsie Cree, MD;  Location: [facility] ENDOSCOPY;  Service: Endoscopy;  Laterality: N/A;   ESOPHAGOGASTRODUODENOSCOPY N/A 12/02/2015   Procedure: ESOPHAGOGASTRODUODENOSCOPY (EGD);  Surgeon: Elsie Cree, MD;  Location: [facility];  Service: Endoscopy;  Laterality: N/A;   FLEXIBLE SIGMOIDOSCOPY N/A 03/10/2022   Procedure: FLEXIBLE SIGMOIDOSCOPY;  Surgeon: Rosalie Kitchens, MD;  Location: [facility] ENDOSCOPY;  Service: Gastroenterology;  Laterality: N/A;   IR KYPHO THORACIC WITH BONE BIOPSY  11/22/2022   IR RADIOLOGIST EVAL & MGMT  11/08/2022   IR RADIOLOGIST EVAL & MGMT  12/19/2022   IR VERTEBROPLASTY CERV/THOR BX INC UNI/BIL INC/INJECT/IMAGING  10/05/2022   lymph node removal left leg     cat scratch surgery    ORIF ANKLE FRACTURE Right 09/03/2021   Procedure: OPEN REDUCTION INTERNAL FIXATION (ORIF) ANKLE FRACTURE;  Surgeon: Edna Toribio LABOR, MD;  Location: [facility] OR;  Service: Orthopedics;  Laterality: Right;   REVERSE SHOULDER ARTHROPLASTY Right 01/17/2024   Procedure: ARTHROPLASTY, SHOULDER, TOTAL, REVERSE;  Surgeon: Cristy Bonner DASEN, MD;  Location: [facility];  Service: Orthopedics;  Laterality: Right;   THROAT SURGERY     UPPP for sleep apnea   TONSILLECTOMY     TOTAL KNEE ARTHROPLASTY Right 04/24/2023   Procedure: TOTAL KNEE ARTHROPLASTY;  Surgeon: Edna Toribio LABOR, MD;  Location: [facility] ORS;  Service: Orthopedics;  Laterality: Right;   VIDEO BRONCHOSCOPY  08/03/2021   Procedure: VIDEO BRONCHOSCOPY WITH FLUORO;  Surgeon: Shelah Lamar RAMAN, MD;  Location: [facility] ENDOSCOPY;  Service: Cardiopulmonary;;    Social History:  reports that she has never smoked. She has never used smokeless tobacco. She reports that she does not drink alcohol  and does not use drugs. Family History: family history includes Breast cancer in her cousin, maternal aunt, maternal grandmother, and paternal grandmother; Hernia in her mother; Lung cancer in her father.   HOME MEDICATIONS: Allergies as of 02/13/2024       Reactions   Latex Other (See Comments)   Blisters / skin peeling   Atenolol  Cough   Other Itching, Swelling, Other (See Comments)   DermaPlast or ANY SURGICAL GLUE Used after surgery - caused swelling, itching, and wound broke open        Medication List        Accurate as of February 13, 2024 10:07 AM. If you have any questions, ask your nurse or doctor.          Accu-Chek Guide test strip Generic drug: glucose blood Use to test blood sugars up to 4 times daily as directed.   amLODipine  5 MG tablet Commonly known as: NORVASC  Take 1 tablet (5 mg total) by mouth daily.   amoxicillin  500 MG tablet Commonly known as: AMOXIL  Take 500 mg by mouth 4 (four) times daily.   apixaban  5 MG Tabs tablet Commonly known as: ELIQUIS  Take 1 tablet (5 mg total) by mouth 2 (two) times daily.   Benadryl  Allergy 25 MG tablet Generic drug: diphenhydrAMINE  Take 25 mg by mouth at bedtime.   carvedilol  12.5 MG tablet Commonly known as: COREG  Take 1 tablet (12.5 mg total) by mouth 2 (two) times daily.   cholecalciferol 25 MCG (1000 UNIT) tablet Commonly  known as: VITAMIN D3 Take 1,000 Units by mouth daily.   FreeStyle Libre 2 Sensor Misc Inject 1 Device into the skin every 14 (fourteen) days.   HumaLOG  KwikPen 100 UNIT/ML KwikPen Generic drug: insulin  lispro Inject 5 Units into the skin with breakfast, with lunch, and with evening meal.   Magnesium  400 MG Caps Take 400 mg by mouth at bedtime.   Melatonin 10 MG Tabs Take 10 mg by mouth at bedtime.   meloxicam  15 MG tablet Commonly known as: MOBIC  Take 1 tablet (15 mg total) by mouth daily. For 2 weeks for pain and inflammation. Then take as needed   metFORMIN  500 MG tablet Commonly known as: GLUCOPHAGE  Take 500 mg by mouth 2 (two) times daily with a meal.   multivitamin tablet Take 1 tablet by mouth daily with breakfast.   olmesartan  40 MG tablet Commonly known  as: BENICAR  Take 1 tablet (40 mg total) by mouth daily.   omeprazole 20 MG capsule Commonly known as: PRILOSEC Take 20 mg by mouth daily.   rosuvastatin  10 MG tablet Commonly known as: CRESTOR  Take 10 mg by mouth daily.   SACCHAROMYCES BOULARDII PO Take by mouth in the morning. 2 gummies   Steqeyma 90 MG/ML Sosy Generic drug: Ustekinumab-stba every 8 (eight) weeks.   TURMERIC PO Take 1 capsule by mouth every morning.   venlafaxine  XR 37.5 MG 24 hr capsule Commonly known as: EFFEXOR -XR Take 37.5 mg by mouth daily with breakfast.          REVIEW OF SYSTEMS: A comprehensive ROS was conducted with the patient and is negative except as per HPI    OBJECTIVE:  VS: BP 122/80 (BP Location: Left Arm, Patient Position: Sitting, Cuff Size: Normal)   Pulse 91   Ht 5' 2 (1.575 m)   Wt 177 lb 3.2 oz (80.4 kg)   SpO2 98%   BMI 32.41 kg/m    Wt Readings from Last 3 Encounters:  02/13/24 177 lb 3.2 oz (80.4 kg)  01/17/24 167 lb 8.8 oz (76 kg)  01/11/24 169 lb (76.7 kg)     EXAM: General: Pt appears well and is in NAD  Neck: General: Supple without adenopathy. Thyroid :  No goiter or nodules  appreciated.  Lungs: Clear with good BS bilat   Heart: Auscultation: RRR.  Extremities:  BL LE: No pretibial edema   Mental Status: Judgment, insight: Intact Orientation: Oriented to time, place, and person Mood and affect: No depression, anxiety, or agitation     DATA REVIEWED:     Latest Reference Range & Units 07/14/23 14:24  TSH 0.40 - 4.50 mIU/L 0.91  T4,Free(Direct) 0.8 - 1.8 ng/dL 1.2    Thyroid  Ultrasound 03/15/2023  Estimated total number of nodules >/= 1 cm: 5   Number of spongiform nodules >/=  2 cm not described below (TR1): 0   Number of mixed cystic and solid nodules >/= 1.5 cm not described below (TR2): 0   _________________________________________________________   Multiple small cysts and spongiform nodules are scattered throughout the thyroid  gland. The vast majority of these lesions do not meet criteria to warrant biopsy or dedicated imaging surveillance. Individual nodules that do warrant further comment are listed below.   Nodule # 2: Solid hypoechoic nodule in the right mid gland measures 1.0 x 0.8 x 0.9 cm. Margins are well defined. No calcifications or suspicious features. Findings are consistent with TI-RADS category 4. *Given size (>/= 1 - 1.4 cm) and appearance, a follow-up ultrasound in 1 year should be considered based on TI-RADS criteria.   Nodule # 7: Ill-defined isoechoic solid nodule versus pseudo nodule in the left lower gland measures approximately 1.9 x 1.3 x 1.2 cm. No calcifications or suspicious features. If in fact a true nodule and not a pseudo nodule, findings would be consistent with TI-RADS category 3. *Given size (>/= 1.5 - 2.4 cm) and appearance, a follow-up ultrasound in 1 year should be considered based on TI-RADS criteria.   IMPRESSION: 1. Multiple small cysts and nodules consistent with multinodular goiter. 2. Nodule # 2 is a 1 cm TI-RADS category 4 nodule in the right mid gland and just meets criteria to recommend  surveillance. Follow-up ultrasound in 1 year is recommended. 3. Nodule # 7 is a 1.9 cm TI-RADS category 3 nodule versus pseudo nodule and also meets criteria for imaging surveillance.   ASSESSMENT/PLAN/RECOMMENDATIONS:   Multinodular  Goiter:   -No local neck symptoms -Patient is clinically euthyroid, TFTs have been normal -Will proceed with thyroid  ultrasound that was a long -We discussed the importance of repeating thyroid  ultrasound annually for total of 5 years to confirm stability of thyroid  nodules   F/u in 1 year  Signed electronically by: Stefano Redgie Butts, MD  [facility]  [facility] [address] [place] [place], [place] [address] Phone: [phone] FAX: [phone]   CC: Henry Ingle, MD [address] [place] [address] Phone: [phone] Fax: [phone]   Return to Endocrinology clinic as below: Future Appointments  Date Time Provider Department Center  02/13/2024 10:10 AM Sun Wilensky, Donell Redgie, MD [facility] None  02/26/2024 10:00 AM [facility] [place]  04/05/2024  1:00 PM [facility] None  04/05/2024  1:30 PM Onesimo Emaline Brink, MD [facility] None

## 2024-02-26 ENCOUNTER — Ambulatory Visit (HOSPITAL_COMMUNITY)
Admission: RE | Admit: 2024-02-26 | Discharge: 2024-02-26 | Disposition: A | Source: Ambulatory Visit | Attending: Internal Medicine | Admitting: Internal Medicine

## 2024-02-26 ENCOUNTER — Ambulatory Visit (HOSPITAL_COMMUNITY)
Admission: RE | Admit: 2024-02-26 | Discharge: 2024-02-26 | Disposition: A | Discharge status: Home / Self Care | Source: Ambulatory Visit | Attending: Hematology | Admitting: Hematology

## 2024-02-26 DIAGNOSIS — C8218 Follicular lymphoma grade II, lymph nodes of multiple sites: Secondary | ICD-10-CM | POA: Diagnosis present

## 2024-02-26 DIAGNOSIS — E042 Nontoxic multinodular goiter: Secondary | ICD-10-CM | POA: Insufficient documentation

## 2024-02-26 DIAGNOSIS — R918 Other nonspecific abnormal finding of lung field: Secondary | ICD-10-CM | POA: Diagnosis present

## 2024-03-01 ENCOUNTER — Ambulatory Visit: Payer: Self-pay | Admitting: Internal Medicine

## 2024-03-12 ENCOUNTER — Other Ambulatory Visit: Payer: Self-pay | Admitting: Physician Assistant

## 2024-03-12 DIAGNOSIS — Z96611 Presence of right artificial shoulder joint: Secondary | ICD-10-CM

## 2024-03-14 ENCOUNTER — Ambulatory Visit
Admission: RE | Admit: 2024-03-14 | Discharge: 2024-03-14 | Disposition: A | Discharge status: Home / Self Care | Source: Ambulatory Visit | Attending: Physician Assistant | Admitting: Physician Assistant

## 2024-03-14 DIAGNOSIS — Z96611 Presence of right artificial shoulder joint: Secondary | ICD-10-CM

## 2024-04-02 ENCOUNTER — Other Ambulatory Visit: Payer: Self-pay | Admitting: Nurse Practitioner

## 2024-04-02 DIAGNOSIS — I10 Essential (primary) hypertension: Secondary | ICD-10-CM

## 2024-04-04 ENCOUNTER — Other Ambulatory Visit: Payer: Self-pay

## 2024-04-04 DIAGNOSIS — C8218 Follicular lymphoma grade II, lymph nodes of multiple sites: Secondary | ICD-10-CM

## 2024-04-05 ENCOUNTER — Inpatient Hospital Stay: Admitting: Hematology

## 2024-04-05 ENCOUNTER — Inpatient Hospital Stay: Attending: Hematology

## 2024-04-05 VITALS — BP 122/70 | HR 83 | Temp 97.9°F | Resp 17 | Ht 62.0 in | Wt 180.0 lb

## 2024-04-05 DIAGNOSIS — Z79899 Other long term (current) drug therapy: Secondary | ICD-10-CM | POA: Diagnosis not present

## 2024-04-05 DIAGNOSIS — Z803 Family history of malignant neoplasm of breast: Secondary | ICD-10-CM | POA: Insufficient documentation

## 2024-04-05 DIAGNOSIS — Z91040 Latex allergy status: Secondary | ICD-10-CM | POA: Diagnosis not present

## 2024-04-05 DIAGNOSIS — M7130 Other bursal cyst, unspecified site: Secondary | ICD-10-CM | POA: Diagnosis not present

## 2024-04-05 DIAGNOSIS — Z8379 Family history of other diseases of the digestive system: Secondary | ICD-10-CM | POA: Insufficient documentation

## 2024-04-05 DIAGNOSIS — Z8701 Personal history of pneumonia (recurrent): Secondary | ICD-10-CM | POA: Insufficient documentation

## 2024-04-05 DIAGNOSIS — M47816 Spondylosis without myelopathy or radiculopathy, lumbar region: Secondary | ICD-10-CM | POA: Insufficient documentation

## 2024-04-05 DIAGNOSIS — K5090 Crohn's disease, unspecified, without complications: Secondary | ICD-10-CM | POA: Insufficient documentation

## 2024-04-05 DIAGNOSIS — E119 Type 2 diabetes mellitus without complications: Secondary | ICD-10-CM | POA: Insufficient documentation

## 2024-04-05 DIAGNOSIS — Z90710 Acquired absence of both cervix and uterus: Secondary | ICD-10-CM | POA: Diagnosis not present

## 2024-04-05 DIAGNOSIS — Z801 Family history of malignant neoplasm of trachea, bronchus and lung: Secondary | ICD-10-CM | POA: Insufficient documentation

## 2024-04-05 DIAGNOSIS — Z7901 Long term (current) use of anticoagulants: Secondary | ICD-10-CM | POA: Insufficient documentation

## 2024-04-05 DIAGNOSIS — M48061 Spinal stenosis, lumbar region without neurogenic claudication: Secondary | ICD-10-CM | POA: Diagnosis not present

## 2024-04-05 DIAGNOSIS — E042 Nontoxic multinodular goiter: Secondary | ICD-10-CM | POA: Insufficient documentation

## 2024-04-05 DIAGNOSIS — Z9882 Breast implant status: Secondary | ICD-10-CM | POA: Diagnosis not present

## 2024-04-05 DIAGNOSIS — C8218 Follicular lymphoma grade II, lymph nodes of multiple sites: Secondary | ICD-10-CM | POA: Insufficient documentation

## 2024-04-05 LAB — CMP (CANCER CENTER ONLY)
ALT: 26 U/L (ref 0–44)
AST: 27 U/L (ref 15–41)
Albumin: 4.9 g/dL (ref 3.5–5.0)
Alkaline Phosphatase: 93 U/L (ref 38–126)
Anion gap: 13 (ref 5–15)
BUN: 23 mg/dL (ref 8–23)
CO2: 27 mmol/L (ref 22–32)
Calcium: 9.8 mg/dL (ref 8.9–10.3)
Chloride: 98 mmol/L (ref 98–111)
Creatinine: 1.18 mg/dL — ABNORMAL HIGH (ref 0.44–1.00)
GFR, Estimated: 49 mL/min — ABNORMAL LOW (ref >60)
Glucose, Bld: 125 mg/dL — ABNORMAL HIGH (ref 70–99)
Potassium: 4.2 mmol/L (ref 3.5–5.1)
Sodium: 138 mmol/L (ref 135–145)
Total Bilirubin: 0.4 mg/dL (ref 0.0–1.2)
Total Protein: 7.2 g/dL (ref 6.5–8.1)

## 2024-04-05 LAB — CBC WITH DIFFERENTIAL (CANCER CENTER ONLY)
Abs Immature Granulocytes: 0.05 K/uL (ref 0.00–0.07)
Basophils Absolute: 0.1 K/uL (ref 0.0–0.1)
Basophils Relative: 1 %
Eosinophils Absolute: 0.2 K/uL (ref 0.0–0.5)
Eosinophils Relative: 2 %
HCT: 39 % (ref 36.0–46.0)
Hemoglobin: 13.7 g/dL (ref 12.0–15.0)
Immature Granulocytes: 1 %
Lymphocytes Relative: 34 %
Lymphs Abs: 2.3 K/uL (ref 0.7–4.0)
MCH: 29.1 pg (ref 26.0–34.0)
MCHC: 35.1 g/dL (ref 30.0–36.0)
MCV: 83 fL (ref 80.0–100.0)
Monocytes Absolute: 1.1 K/uL — ABNORMAL HIGH (ref 0.1–1.0)
Monocytes Relative: 16 %
Neutro Abs: 3.1 K/uL (ref 1.7–7.7)
Neutrophils Relative %: 46 %
Platelet Count: 245 K/uL (ref 150–400)
RBC: 4.7 MIL/uL (ref 3.87–5.11)
RDW: 13.1 % (ref 11.5–15.5)
WBC Count: 6.7 K/uL (ref 4.0–10.5)
nRBC: 0 % (ref 0.0–0.2)

## 2024-04-05 LAB — SAMPLE TO BLOOD BANK

## 2024-04-05 LAB — LACTATE DEHYDROGENASE: LDH: 223 U/L (ref 105–235)

## 2024-04-05 NOTE — Progress Notes (Signed)
 HEMATOLOGY ONCOLOGY PROGRESS NOTE  Date of service: 04/05/2024  Patient Care Team: Henry Ingle, MD as PCP - General (Internal Medicine) Loni Soyla LABOR, MD as PCP - Cardiology (Cardiology)  CHIEF COMPLAINT/PURPOSE OF CONSULTATION: Follow-up for continued evaluation and management of low-grade follicular lymphoma.  HISTORY OF PRESENTING ILLNESS: Please see previous notes for details on initial presentation.    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 female who is here today for continued evaluation and management of follicular lymphoma.   she was last seen by me on 09/25/2023; at the time she did not have any concerns and was doing well.   Today, she reports she had a shoulder surgery   She had a fracture of her shoulder that was discovered after her shoulder surgery. She reports there was no trauma to the area to cause the fracture. She is wearing a sling on her right arm as her fracture heals.   She reports feeling some changes in her breathing and describes it as having the wind knocked out of her from time to time.   Denies any fevers/chills, drenching night sweats, or leg swelling.   REVIEW OF SYSTEMS:   10 Point review of systems of done and is negative except as noted above.  MEDICAL HISTORY Past Medical History:  Diagnosis Date   A-fib [facility])    Achalasia    Allergy    year around allergies   Anemia    Arthritis    fingers, knees, neck, back-osteoarthristis   Bronchitis    hx of   Complication of anesthesia    Crohn's disease (HCC)    Dysrhythmia    a. fib   Follicular lymphoma grade II of lymph  nodes of multiple sites (HCC) 04/02/2019   Hypertension    Pneumonia    hx of    PONV (postoperative nausea and vomiting)    Thyroid  nodule    mulitple nodules that are being monitored   Type 2 diabetes mellitus (HCC) 08/02/2021    SURGICAL HISTORY Past Surgical History:  Procedure Laterality Date   ABDOMINAL HYSTERECTOMY     ADENOIDECTOMY     ANKLE SURGERY Right    BIOPSY  03/10/2022   Procedure: BIOPSY;  Surgeon: Rosalie Kitchens, MD;  Location: [facility] ENDOSCOPY;  Service: Gastroenterology;;   BRONCHIAL BRUSHINGS  08/03/2021   Procedure: BRONCHIAL BRUSHINGS;  Surgeon: Shelah Lamar RAMAN, MD;  Location: [facility] ENDOSCOPY;  Service: Cardiopulmonary;;   BRONCHIAL WASHINGS  08/03/2021   Procedure: BRONCHIAL WASHINGS;  Surgeon: Shelah Lamar RAMAN, MD;  Location: [facility] ENDOSCOPY;  Service: Cardiopulmonary;;   cosmetic surgeries     brow lift and lipsuction on abdominal, breast implants   ESOPHAGEAL MANOMETRY N/A 12/02/2015   Procedure: ESOPHAGEAL MANOMETRY (EM);  Surgeon: Elsie Cree, MD;  Location: [facility] ENDOSCOPY;  Service: Endoscopy;  Laterality: N/A;   ESOPHAGOGASTRODUODENOSCOPY N/A 12/02/2015   Procedure: ESOPHAGOGASTRODUODENOSCOPY (EGD);  Surgeon: Elsie Cree, MD;  Location: [facility] ENDOSCOPY;  Service: Endoscopy;  Laterality: N/A;   FLEXIBLE SIGMOIDOSCOPY N/A 03/10/2022   Procedure: FLEXIBLE SIGMOIDOSCOPY;  Surgeon: Rosalie Kitchens, MD;  Location: [facility] ENDOSCOPY;  Service: Gastroenterology;  Laterality: N/A;   IR  KYPHO THORACIC WITH BONE BIOPSY  11/22/2022   IR RADIOLOGIST EVAL & MGMT  11/08/2022   IR RADIOLOGIST EVAL & MGMT  12/19/2022   IR VERTEBROPLASTY CERV/THOR BX INC UNI/BIL INC/INJECT/IMAGING  10/05/2022   lymph node removal left leg     cat scratch surgery    ORIF ANKLE FRACTURE Right 09/03/2021   Procedure: OPEN REDUCTION INTERNAL FIXATION (ORIF) ANKLE FRACTURE;  Surgeon: Edna Toribio LABOR, MD;  Location: [facility] OR;  Service: Orthopedics;  Laterality: Right;   REVERSE SHOULDER ARTHROPLASTY Right  01/17/2024   Procedure: ARTHROPLASTY, SHOULDER, TOTAL, REVERSE;  Surgeon: Cristy Bonner DASEN, MD;  Location: [facility] ORS;  Service: Orthopedics;  Laterality: Right;   THROAT SURGERY     UPPP for sleep apnea   TONSILLECTOMY     TOTAL KNEE ARTHROPLASTY Right 04/24/2023   Procedure: TOTAL KNEE ARTHROPLASTY;  Surgeon: Edna Toribio LABOR, MD;  Location: [facility] ORS;  Service: Orthopedics;  Laterality: Right;   VIDEO BRONCHOSCOPY  08/03/2021   Procedure: VIDEO BRONCHOSCOPY WITH FLUORO;  Surgeon: Shelah Lamar RAMAN, MD;  Location: [facility] ENDOSCOPY;  Service: Cardiopulmonary;;    SOCIAL HISTORY Social History[1]  Social History   Social History Narrative   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, newspaper at [employer]   Never smoker never drinker   No drugs   Multiple allergies   Lives at home with her husband of 8 years since 2008    SOCIAL DRIVERS OF HEALTH SDOH Screenings   Food Insecurity: No Food Insecurity (04/24/2023)  Housing: Unknown (04/24/2023)  Transportation Needs: No Transportation Needs (04/24/2023)  Utilities: Not At Risk (04/24/2023)  Social Connections: Socially Isolated (04/24/2023)  Tobacco Use: Low Risk (02/13/2024)     FAMILY HISTORY Family History  Problem Relation Age of Onset   Lung cancer Father        1982 died from it   Hernia Mother    Breast cancer Maternal Aunt        multiple    Breast cancer Maternal Grandmother    Breast cancer Paternal Grandmother    Breast cancer Cousin        cousins multiple    Colon cancer Neg Hx    Colon polyps Neg Hx    Rectal cancer Neg Hx    Stomach cancer Neg Hx      ALLERGIES: is allergic to latex, atenolol , and other.  MEDICATIONS  Current Outpatient Medications  Medication Sig Dispense Refill   amLODipine  (NORVASC ) 5 MG tablet Take 1 tablet (5 mg total) by mouth daily. 30 tablet 3   amoxicillin  (AMOXIL ) 500 MG tablet Take 500 mg by mouth 4 (four) times daily.     apixaban  (ELIQUIS ) 5 MG TABS tablet Take 1  tablet (5 mg total) by mouth 2 (two) times daily. 180 tablet 1   BENADRYL  ALLERGY 25 MG tablet Take 25 mg by mouth at bedtime.     carvedilol  (COREG ) 12.5 MG tablet Take 1 tablet (12.5 mg total) by mouth 2 (two) times daily. 180 tablet 2   cholecalciferol (VITAMIN D3) 25 MCG (1000 UNIT) tablet Take 1,000 Units by mouth daily.     Continuous Blood Gluc Sensor (FREESTYLE LIBRE 2 SENSOR) MISC Inject 1 Device into the skin every 14 (fourteen) days.     glucose blood (ACCU-CHEK GUIDE) test strip Use to test blood sugars up to 4 times daily as directed. 100 each 0   HUMALOG  KWIKPEN 100 UNIT/ML KwikPen Inject 5 Units into the skin with breakfast, with lunch, and with  evening meal. 15 mL 11   Magnesium  400 MG CAPS Take 400 mg by mouth at bedtime.     Melatonin 10 MG TABS Take 10 mg by mouth at bedtime.     meloxicam  (MOBIC ) 15 MG tablet Take 1 tablet (15 mg total) by mouth daily. For 2 weeks for pain and inflammation. Then take as needed 30 tablet 0   metFORMIN  (GLUCOPHAGE ) 500 MG tablet Take 500 mg by mouth 2 (two) times daily with a meal.     Multiple Vitamin (MULTIVITAMIN) tablet Take 1 tablet by mouth daily with breakfast.     olmesartan  (BENICAR ) 40 MG tablet TAKE 1 TABLET BY MOUTH DAILY 70 tablet 4   omeprazole (PRILOSEC) 20 MG capsule Take 20 mg by mouth daily.     rosuvastatin  (CRESTOR ) 10 MG tablet Take 10 mg by mouth daily.     SACCHAROMYCES BOULARDII PO Take by mouth in the morning. 2 gummies     STEQEYMA 90 MG/ML SOSY every 8 (eight) weeks.     TURMERIC PO Take 1 capsule by mouth every morning.     venlafaxine  XR (EFFEXOR -XR) 37.5 MG 24 hr capsule Take 37.5 mg by mouth daily with breakfast.     No current facility-administered medications for this visit.    PHYSICAL EXAMINATION: ECOG PERFORMANCE STATUS: 1 - Symptomatic but completely ambulatory VITALS: Vitals:   04/05/24 1333 04/05/24 1338  BP: (!) 138/94 122/70  Pulse: 83   Resp: 17   Temp: 97.9 F (36.6 C)   SpO2: 97%     Filed Weights   04/05/24 1333  Weight: 180 lb (81.6 kg)   Body mass index is 32.92 kg/m.  GENERAL: alert, in no acute distress and comfortable SKIN: no acute rashes, no significant lesions EYES: conjunctiva are pink and non-injected, sclera anicteric OROPHARYNX: MMM, no exudates, no oropharyngeal erythema or ulceration NECK: supple, no JVD LYMPH:  no palpable lymphadenopathy in the cervical, axillary or inguinal regions LUNGS: clear to auscultation b/l with normal respiratory effort HEART: regular rate & rhythm ABDOMEN:  normoactive bowel sounds , non tender, not distended, no hepatosplenomegaly Extremity: no pedal edema PSYCH: alert & oriented x 3 with fluent speech NEURO: no focal motor/sensory deficits  LABORATORY DATA:   I have reviewed the data as listed     Latest Ref Rng & Units 04/05/2024    1:05 PM 01/11/2024   11:26 AM 09/25/2023   11:08 AM  CBC EXTENDED  WBC 4.0 - 10.5 K/uL 6.7  7.6  6.8   RBC 3.87 - 5.11 MIL/uL 4.70  4.77  4.61   Hemoglobin 12.0 - 15.0 g/dL 86.2  86.3  86.4   HCT 36.0 - 46.0 % 39.0  41.5  38.4   Platelets 150 - 400 K/uL 245  253  214   NEUT# 1.7 - 7.7 K/uL 3.1   4.4   Lymph# 0.7 - 4.0 K/uL 2.3   1.3        Latest Ref Rng & Units 04/05/2024    1:05 PM 01/11/2024   11:26 AM 09/25/2023   11:08 AM  CMP  Glucose 70 - 99 mg/dL 874  854  801   BUN 8 - 23 mg/dL 23  14  16    Creatinine 0.44 - 1.00 mg/dL 8.81  9.08  8.91   Sodium 135 - 145 mmol/L 138  140  141   Potassium 3.5 - 5.1 mmol/L 4.2  4.3  3.6   Chloride 98 - 111 mmol/L 98  100  100   CO2 22 - 32 mmol/L 27  26  31    Calcium  8.9 - 10.3 mg/dL 9.8  9.7  9.5   Total Protein 6.5 - 8.1 g/dL 7.2   6.7   Total Bilirubin 0.0 - 1.2 mg/dL 0.4   0.4   Alkaline Phos 38 - 126 U/L 93   84   AST 15 - 41 U/L 27   19   ALT 0 - 44 U/L 26   19       05/14/2019 CT ANGIO CHEST PE W OR WO CONTRAST (Accession [id number]):   03/08/2019 Lymph Node Biopsy ([id number]):          RADIOGRAPHIC  STUDIES: I have personally reviewed the radiological images as listed and agreed with the findings in the report. CT SHOULDER RIGHT WO CONTRAST Result Date: 03/14/2024 CLINICAL DATA:  Right shoulder pain post reverse arthroplasty 01/17/2024. EXAM: CT OF THE UPPER RIGHT EXTREMITY WITHOUT CONTRAST TECHNIQUE: Multidetector CT imaging of the upper right extremity was performed according to the standard protocol. RADIATION DOSE REDUCTION: This exam was performed according to the departmental dose-optimization program which includes automated exposure control, adjustment of the mA and/or kV according to patient size and/or use of iterative reconstruction technique. COMPARISON:  Postoperative radiographs 01/17/2024. Preoperative CT 01/02/2024. FINDINGS: Bones/Joint/Cartilage Status post right shoulder reverse arthroplasty. The hardware is intact without loosening. There is an acute appearing nondisplaced fracture of the acromion. The remainder of the right scapula and visualized right clavicle are intact. Moderate acromioclavicular degenerative changes. No significant shoulder joint effusion identified. Ligaments Suboptimally assessed by CT. Muscles and Tendons No acute muscular findings identified. Mild supraspinatus and subscapularis muscular atrophy. Soft tissues No evidence of periarticular fluid collection, unexpected foreign body or soft tissue emphysema. Similar chronic scarring at the right lung apex. Partially imaged right breast implant. IMPRESSION: 1. Acute appearing nondisplaced fracture of the acromion. 2. No other significant findings status post interval reverse arthroplasty. No evidence of hardware loosening or dislocation. 3. Moderate acromioclavicular degenerative changes. Electronically Signed   By: Elsie Perone M.D.   On: 03/14/2024 10:30   CT Chest Wo Contrast Result Date: 03/02/2024 EXAM: CT CHEST WITHOUT CONTRAST 02/26/2024 11:44:51 AM TECHNIQUE: CT of the chest was performed without the  administration of intravenous contrast. Multiplanar reformatted images are provided for review. Automated exposure control, iterative reconstruction, and/or weight based adjustment of the mA/kV was utilized to reduce the radiation dose to as low as reasonably achievable. COMPARISON: CT Chest 03/15/2023. CLINICAL HISTORY: Hematologic malignancy, follicular lymphoma grade II of lymph nodes of multiple sites, lung nodules, and non Hodgkin lymphoma. FINDINGS: MEDIASTINUM: The heart is normal in size. No pericardial effusion. The aorta is normal in caliber with stable scattered atherosclerotic calcifications. Stable 3-vessel coronary artery calcifications. The central airways are clear. Stable dilated esophagus. LYMPH NODES: Small scattered mediastinal and hilar nodes are stable. No mass or overt adenopathy. No axillary lymphadenopathy. LUNGS AND PLEURA: New linear density at the right lung apex, measuring 15 mm. This has the appearance of scarring change but was not present on the prior study and is somewhat concerning. The 6 mm nodule in the lingula on image 85/6 is stable. 3 mm nodule in the right lower lobe on image number 11/6. Small scattered calcified granulomas are stable. Stable probable radiation changes involving the anterior aspect of the lingula. No acute findings pulmonary process. No pleural effusion or pneumothorax. SOFT TISSUES/BONES: Bilateral breast prostheses. No breast masses, supraclavicular or axillary adenopathy. The bony thorax is intact.  No findings suspicious for metastatic bone disease. Remote vertebral augmentation changes in the mid thoracic spine. UPPER ABDOMEN: Stable low attenuation lesion at the right hepatic dome consistent with a benign cyst. No worrisome hepatic lesions or intrahepatic biliary dilatation. Stable calcified granulomas in the liver and spleen. No adrenal gland lesions. Stable vascular disease. Stable hiatal hernia. IMPRESSION: 1. New linear density at the right lung apex  measuring 15 mm, favored scarring; recommend non-contrast chest CT at 36 months to confirm stability given oncologic history and new finding. 2. Stable 6 mm lingular nodule and new 3 mm right lower lobe nodule; for the 6 mm solid nodule, recommend non-contrast chest CT at 612 months, then consider 1824 months if high risk. 3. Stable small mediastinal and hilar lymph nodes without overt adenopathy. 4. No evidence of recurrent lymphoma. Electronically signed by: Maude Stammer MD 03/02/2024 11:30 AM EST RP Workstation: [id number]   US  THYROID  Result Date: 02/29/2024 CLINICAL DATA:  Follow-up thyroid  nodules. EXAM: THYROID  ULTRASOUND TECHNIQUE: Ultrasound examination of the thyroid  gland and adjacent soft tissues was performed. COMPARISON:  03/15/2023 FINDINGS: Parenchymal Echotexture: Mildly heterogenous Isthmus: 0.4 cm, previously 0.4 cm Right lobe: 4.8 x 1.7 x 2.1 cm, previously 4.9 x 2.1 x 2.5 cm Left lobe: 5.0 x 1.4 x 1.9 cm, previously 5.1 x 2.2 x 1.9 cm _________________________________________________________ Estimated total number of nodules >/= 1 cm: 2 Number of spongiform nodules >/=  2 cm not described below (TR1): 0 Number of mixed cystic and solid nodules >/= 1.5 cm not described below (TR2): 0 _________________________________________________________ Multiple bilateral thyroid  nodules. Nodule labeled as # 2 in the right mid thyroid  lobe is likely a spongiform nodule and measures 1.2 x 1.1 x 1.1 cm. Minimally changed from the previous examination. This nodule does not meet criteria for biopsy or dedicated follow-up. Nodule 3 in the right mid thyroid  lobe is an isoechoic nodule that measures 1.0 x 0.9 x 1.0 cm. This is a TR 3 nodule that does not meet criteria for biopsy or dedicated follow-up. Nodule 4 in the right inferior thyroid  lobe is a predominantly spongiform nodule that measures up to 1.2 cm and does not meet criteria for biopsy or dedicated follow-up. Nodule 5 in the right inferior thyroid   lobe is a predominantly isoechoic nodule that measures up to 1.1 cm and does not meet criteria for biopsy or dedicated follow-up. Nodule 7 is an ill-defined isoechoic nodule in the inferior left thyroid  lobe that measures 1.9 x 1.3 x 1.3 cm and previously measured 1.9 x 1.3 x 1.2 cm. This is most compatible with a TR 3 nodule. *Given size (>/= 1.5 - 2.4 cm) and appearance, a follow-up ultrasound in 1 year should be considered based on TI-RADS criteria. IMPRESSION: 1. Multiple thyroid  nodules. 2. Left inferior thyroid  nodule, nodule 7, is stable since 2024. This is a TR 3 nodule and meets criteria for 1 year follow-up. 3. No new suspicious thyroid  nodules. The above is in keeping with the ACR TI-RADS recommendations - J Am Coll Radiol 2017;14:587-595. Electronically Signed   By: Juliene Balder M.D.   On: 02/29/2024 15:13   DG Shoulder Right Port Result Date: 01/17/2024 CLINICAL DATA:  Postop. EXAM: RIGHT SHOULDER - 1 VIEW COMPARISON:  None Available. FINDINGS: Reverse right shoulder arthroplasty in expected alignment. No periprosthetic lucency or fracture. Recent postsurgical change includes air and edema in the joint space and soft tissues. IMPRESSION: Reverse right shoulder arthroplasty without immediate postoperative complication. Electronically Signed   By: Andrea Marlee HERO.D.  On: 01/17/2024 12:54    ASSESSMENT & PLAN:  68 y.o. female with   #1 Stage III/IV follicular lymphoma - likely low grade but accurate grading difficult   11/02/2018 MRI lumbar spine wo contrast revealed Extensive retroperitoneal lymphadenopathy highly worrisome for neoplastic process such as lymphoproliferative disease. Spondylosis worst at L4-5 where there is narrowing in the right subarticular recess predominantly due to a synovial cyst off the medial margin of the right facet with encroachment on the descending right L5 root. There is mild central canal narrowing overall at this Level.   12/04/2018 CT CAP revealed  Mild-to-moderate abdominal and pelvic lymphadenopathy and new 9 mm left superior mediastinal lymph node, highly suspicious for lymphoproliferative disorder. Stable 8 mm lingular pulmonary nodule, consistent with benign Etiology. Moderate hepatic steatosis. 1.3 cm indeterminate low-attenuation lesion in the right lobe. Recommend continued attention on follow-up CT.   12/18/2018 lymph node biopsy pathology revealing a follicular lymphoma- likely low grade but accurate grading not possible on limited sampling.   02/06/2019 PET/CT scan ([id number]) revealed 1. Bulky hypermetabolic lymphadenopathy in the abdomen and upper pelvis, as described. This is associated with mild hypermetabolic lymphadenopathy in the upper mediastinum and right axilla. Hypermetabolic lymphadenopathy represents a combination of Deauville 4 and Deauville 5 disease. 2. 8 mm nodule in the lingula without hypermetabolism. Attention on follow-up recommended. 3. Hepatic steatosis. 4.  Aortic Atherosclerois (ICD10-170.0).   03/08/2019 Lymph Node Biopsy ([id number]) revealed LYMPH NODE, NEEDLE CORE BIOPSY: - Follicular lymphoma (grade 1-2 of 3) with elevated proliferation rate.   2. Mild treatment related thrombocytopenia- resolved.     3.  H/o Melena.  2 episodes about a month ago.  Hemoglobin stable today. Previously had upper abdominal discomfort which has resolved. Has some chronic nausea due to achalasia.   4.  Chronic ALT elevation -likely from her fatty liver. Plan -Follow-up with PCP and GI for management of hepatic steatosis/steatohepatitis. -Avoid hepatotoxic medications. -Recommended optimal diet and exercise and to optimize control of diabetes with PCP.   5. DM2 -following with PCP   6.  Bilateral Extensive multifocal groundglass opacities with interstitial thickening./Interstitial lung disease following with Dr. Kara from pulmonary. Currently on steroid taper and is off home oxygen.    PLAN: - Discussed  lab results on 04/05/2024 in detail with patient: -CBC came back and looks stable -CT Scan is not concerning, no fast moving growth of nodules. One small nodule on left lingular .25in another small nodules 3mm in lower left lung. -Repeat CT Scan in about a year  -Follow-up in 1 year in clinic  FOLLOW-UP in 12 months for labs and follow-up with Dr. Onesimo.  The total time spent in the appointment was *** minutes* .  All of the patient's questions were answered and the patient knows to call the clinic with any problems, questions, or concerns.  Emaline Onesimo MD MS AAHIVMS [facility] Hematology/Oncology Physician [facility]  *Total Encounter Time as defined by the Centers for Medicare and Medicaid Services includes, in addition to the face-to-face time of a patient visit (documented in the note above) non-face-to-face time: obtaining and reviewing outside history, ordering and reviewing medications, tests or procedures, care coordination (communications with other health care professionals or caregivers) and documentation in the medical record.  I, Alan Blowers, acting as a neurosurgeon for Emaline Onesimo, MD.,have documented all relevant documentation on the behalf of Emaline Onesimo, MD,as directed by  Emaline Onesimo, MD while in the presence of Emaline Onesimo, MD.  I have reviewed  the above documentation for accuracy and completeness, and I agree with the above.      [1]  Social History Tobacco Use   Smoking status: Never   Smokeless tobacco: Never  Vaping Use   Vaping status: Never Used  Substance Use Topics   Alcohol  use: No    Alcohol /week: 0.0 standard drinks of alcohol    Drug use: No

## 2024-07-17 ENCOUNTER — Ambulatory Visit: Admitting: Internal Medicine
